# Patient Record
Sex: Male | Born: 1940 | Race: White | Hispanic: No | Smoking: Former smoker
Health system: Southern US, Community
[De-identification: ages and names within clinical notes are randomized; demographics above are authoritative.]

## PROBLEM LIST (undated history)

## (undated) DIAGNOSIS — F32A Depression, unspecified: Secondary | ICD-10-CM

## (undated) DIAGNOSIS — N400 Enlarged prostate without lower urinary tract symptoms: Secondary | ICD-10-CM

## (undated) DIAGNOSIS — G4730 Sleep apnea, unspecified: Secondary | ICD-10-CM

## (undated) DIAGNOSIS — N184 Chronic kidney disease, stage 4 (severe): Secondary | ICD-10-CM

## (undated) DIAGNOSIS — K219 Gastro-esophageal reflux disease without esophagitis: Secondary | ICD-10-CM

## (undated) DIAGNOSIS — H7100 Cholesteatoma of attic, unspecified ear: Secondary | ICD-10-CM

## (undated) DIAGNOSIS — E119 Type 2 diabetes mellitus without complications: Secondary | ICD-10-CM

## (undated) DIAGNOSIS — I5022 Chronic systolic (congestive) heart failure: Secondary | ICD-10-CM

## (undated) DIAGNOSIS — I739 Peripheral vascular disease, unspecified: Secondary | ICD-10-CM

## (undated) DIAGNOSIS — I509 Heart failure, unspecified: Secondary | ICD-10-CM

## (undated) DIAGNOSIS — F329 Major depressive disorder, single episode, unspecified: Secondary | ICD-10-CM

## (undated) DIAGNOSIS — I480 Paroxysmal atrial fibrillation: Secondary | ICD-10-CM

## (undated) DIAGNOSIS — I639 Cerebral infarction, unspecified: Secondary | ICD-10-CM

## (undated) DIAGNOSIS — H9390 Unspecified disorder of ear, unspecified ear: Secondary | ICD-10-CM

## (undated) DIAGNOSIS — I10 Essential (primary) hypertension: Secondary | ICD-10-CM

## (undated) DIAGNOSIS — F419 Anxiety disorder, unspecified: Secondary | ICD-10-CM

## (undated) DIAGNOSIS — J61 Pneumoconiosis due to asbestos and other mineral fibers: Secondary | ICD-10-CM

## (undated) DIAGNOSIS — I2510 Atherosclerotic heart disease of native coronary artery without angina pectoris: Secondary | ICD-10-CM

## (undated) DIAGNOSIS — E785 Hyperlipidemia, unspecified: Secondary | ICD-10-CM

## (undated) DIAGNOSIS — D649 Anemia, unspecified: Secondary | ICD-10-CM

## (undated) DIAGNOSIS — I493 Ventricular premature depolarization: Secondary | ICD-10-CM

## (undated) HISTORY — DX: Benign prostatic hyperplasia without lower urinary tract symptoms: N40.0

## (undated) HISTORY — PX: COCHLEAR IMPLANT: SUR684

## (undated) HISTORY — DX: Unspecified disorder of ear, unspecified ear: H93.90

## (undated) HISTORY — PX: TONSILLECTOMY: SUR1361

## (undated) HISTORY — DX: Type 2 diabetes mellitus without complications: E11.9

## (undated) HISTORY — DX: Pneumoconiosis due to asbestos and other mineral fibers: J61

## (undated) HISTORY — PX: CAROTID STENT INSERTION: SHX5766

## (undated) HISTORY — DX: Peripheral vascular disease, unspecified: I73.9

## (undated) HISTORY — PX: CORONARY STENT PLACEMENT: SHX1402

## (undated) HISTORY — DX: Cholesteatoma of attic, unspecified ear: H71.00

## (undated) HISTORY — DX: Essential (primary) hypertension: I10

## (undated) HISTORY — DX: Hyperlipidemia, unspecified: E78.5

## (undated) HISTORY — DX: Atherosclerotic heart disease of native coronary artery without angina pectoris: I25.10

## (undated) HISTORY — DX: Cerebral infarction, unspecified: I63.9

---

## 2012-01-30 ENCOUNTER — Other Ambulatory Visit: Payer: Self-pay | Admitting: Otolaryngology

## 2012-01-30 DIAGNOSIS — H7103 Cholesteatoma of attic, bilateral: Secondary | ICD-10-CM

## 2012-02-01 ENCOUNTER — Ambulatory Visit
Admission: RE | Admit: 2012-02-01 | Discharge: 2012-02-01 | Disposition: A | Discharge status: Home / Self Care | Payer: Medicare Other | Source: Ambulatory Visit | Attending: Otolaryngology | Admitting: Otolaryngology

## 2012-02-01 DIAGNOSIS — H7103 Cholesteatoma of attic, bilateral: Secondary | ICD-10-CM

## 2012-03-24 LAB — PROTIME-INR

## 2012-05-12 LAB — PROTIME-INR

## 2012-06-26 ENCOUNTER — Ambulatory Visit (INDEPENDENT_AMBULATORY_CARE_PROVIDER_SITE_OTHER): Payer: Medicare Other | Admitting: Cardiology

## 2012-06-26 ENCOUNTER — Encounter: Payer: Self-pay | Admitting: Cardiology

## 2012-06-26 VITALS — BP 169/77 | HR 67 | Ht 68.0 in | Wt 194.0 lb

## 2012-06-26 DIAGNOSIS — E785 Hyperlipidemia, unspecified: Secondary | ICD-10-CM

## 2012-06-26 DIAGNOSIS — Z01810 Encounter for preprocedural cardiovascular examination: Secondary | ICD-10-CM | POA: Insufficient documentation

## 2012-06-26 DIAGNOSIS — I2510 Atherosclerotic heart disease of native coronary artery without angina pectoris: Secondary | ICD-10-CM

## 2012-06-26 DIAGNOSIS — I2589 Other forms of chronic ischemic heart disease: Secondary | ICD-10-CM

## 2012-06-26 DIAGNOSIS — E782 Mixed hyperlipidemia: Secondary | ICD-10-CM | POA: Insufficient documentation

## 2012-06-26 DIAGNOSIS — I739 Peripheral vascular disease, unspecified: Secondary | ICD-10-CM

## 2012-06-26 DIAGNOSIS — I25810 Atherosclerosis of coronary artery bypass graft(s) without angina pectoris: Secondary | ICD-10-CM | POA: Insufficient documentation

## 2012-06-26 DIAGNOSIS — I10 Essential (primary) hypertension: Secondary | ICD-10-CM

## 2012-06-26 DIAGNOSIS — I255 Ischemic cardiomyopathy: Secondary | ICD-10-CM

## 2012-06-26 MED ORDER — CARVEDILOL 12.5 MG PO TABS: 12.5000 mg | ORAL_TABLET | Freq: Two times a day (BID) | ORAL | Status: DC

## 2012-06-26 NOTE — Assessment & Plan Note (Addendum)
Continue ARB and beta blocker; increase Coreg to 12.5 mg by mouth twice a day. We'll most likely repeat echocardiogram in 3 months when he returns to see if his LV function has improved following PCI of his LAD.

## 2012-06-26 NOTE — Assessment & Plan Note (Signed)
Continue aspirin, Plavix and statin. 

## 2012-06-26 NOTE — Assessment & Plan Note (Signed)
Continue aspirin, Plavix and statin. We will reassess with ABIs in the future.

## 2012-06-26 NOTE — Patient Instructions (Addendum)
Your physician recommends that you schedule a follow-up appointment in: 3 MONTHS WITH DR CRENSHAW  INCREASE CARVEDILOL TO 12.5 MG TWICE DAILY

## 2012-06-26 NOTE — Assessment & Plan Note (Signed)
Blood pressure is elevated. Increase carvedilol to 12.5 mg by mouth twice a day both for blood pressure and his ischemic cardiomyopathy.

## 2012-06-26 NOTE — Assessment & Plan Note (Signed)
Patient presents for preoperative evaluation prior to ear surgery which will require general anesthesia. He has no recurrent symptoms of dyspnea or chest pain. I therefore feel he could proceed with surgery without further ischemia evaluation. However he had drug-eluting stents placed to his LAD in June of 2013. He therefore should continue dual antiplatelet therapy for one year uninterrupted. Discontinuing his Plavix would increase the risk of stent thrombosis. We will need to discuss this with ENT. If he can have his surgery on both aspirin and Plavix then he may proceed. Otherwise I would prefer him to complete a full year of Plavix prior to interrupting and proceeding with ear surgery.

## 2012-06-26 NOTE — Progress Notes (Signed)
HPI: 71 year old male with past medical history of coronary artery disease for preoperative evaluation prior to ear surgery which will require general anesthesia. Patient states he suffered a myocardial infarction at age 1 in [place]. He had a cardiac catheterization but medical therapy was recommended. Patient was recently seen in [facility] preoperatively. An echocardiogram was performed and by report ejection fraction was 30-35%, mild LVH, mild left atrial enlargement, mild mitral regurgitation and trace aortic insufficiency. The patient subsequently was seen in [place] by his nephew who is a cardiologist. He apparently was having some chest pain as well and underwent cardiac catheterization in June of 2013. His ejection fraction was 40% and there was hypokinesis of the anterior wall. There was a 30-40% mid right coronary artery and a 30-40% mid PDA. The left main was normal. There was an 80% proximal LAD and a 70-80% mid lesion. The circumflex had a 90% lesion after the origin of a first marginal but continued mainly as a moderate size atrial branch. The patient had PCI of his LAD with 2 Ion drug-eluting stents. Medical therapy recommended for the circumflex. In July of 2013 the patient had an aortogram because of claudication and abnormal ABIs. There was no abdominal aortic aneurysm and the renal arteries were normal. There is a 50% distal common iliac on the left. The SFA had multiple lesions from 50-70%. 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 There was a 70-80% proximal right common iliac artery. 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 The SFA had a 70-80% lesion. The anterior tibial had a 50-60% lesion. The patient had a covered stent to the common iliac on the right. If the patient had persistent symptoms there was plan for her intervention to the SFA on the right. Medical therapy recommended for the left. The patient has some dyspnea on exertion but no  orthopnea, PND, pedal edema, palpitations, syncope or exertional chest pain. He has pain in his thighs with ambulation bilaterally.  Current Outpatient Prescriptions  Medication Sig Dispense Refill  . allopurinol (ZYLOPRIM) 300 MG tablet Take 1 tablet by mouth Daily.      Marland Kitchen aspirin 81 MG tablet Take 81 mg by mouth daily.      . carvedilol (COREG) 12.5 MG tablet Take 1 tablet (12.5 mg total) by mouth 2 (two) times daily with a meal.  180 tablet  4  . cephALEXin (KEFLEX) 500 MG capsule 3 tabs po qd      . clopidogrel (PLAVIX) 75 MG tablet Take 1 tablet by mouth Daily.      . CRESTOR 20 MG tablet Take 1 tablet by mouth Daily.      Marland Kitchen DIOVAN 320 MG tablet Take 1 tablet by mouth Daily.      Marland Kitchen glipiZIDE (GLUCOTROL XL) 2.5 MG 24 hr tablet Take 2.5 mg by mouth 2 (two) times daily.      Marland Kitchen glipiZIDE (GLUCOTROL XL) 5 MG 24 hr tablet Take 5 mg by mouth 2 (two) times daily.      . hydrochlorothiazide (MICROZIDE) 12.5 MG capsule Take 1 tablet by mouth Daily.      . metFORMIN (GLUCOPHAGE-XR) 500 MG 24 hr tablet Take 1,000 mg by mouth BID times 48H.       . pantoprazole (PROTONIX) 40 MG tablet Take 40 mg by mouth daily.      Marland Kitchen SPIRIVA HANDIHALER 18 MCG inhalation capsule PRN      . Tamsulosin HCl (FLOMAX) 0.4 MG CAPS Take 1 tablet by mouth Daily.      Marland Kitchen  DISCONTD: carvedilol (COREG) 6.25 MG tablet Take 1 tablet by mouth BID times 48H.        Allergies  Allergen Reactions  . Penicillins     Past Medical History  Diagnosis Date  . Cholesteatoma of attic   . CAD (coronary artery disease)   . Ear disease   . Stroke   . Diabetes mellitus   . PVD (peripheral vascular disease)   . Hypertension   . Hyperlipidemia   . Asbestosis   . BPH (benign prostatic hyperplasia)     Past Surgical History  Procedure Date  . Coronary stent placement   . Cochlear implant   . Tonsillectomy     History   Social History  . Marital Status: Widowed    Spouse Name: N/A    Number of Children: 3  . Years of  Education: N/A   Occupational History  . Not on file.   Social History Main Topics  . Smoking status: Former Research scientist (life sciences)  . Smokeless tobacco: Not on file  . Alcohol Use: Yes     2-3 glasses wine per day  . Drug Use: Not on file  . Sexually Active: Not on file   Other Topics Concern  . Not on file   Social History Narrative  . No narrative on file    Family History  Problem Relation Age of Onset  . Diabetes      ROS: diminished hearing but no fevers or chills, productive cough, hemoptysis, dysphasia, odynophagia, melena, hematochezia, dysuria, hematuria, rash, seizure activity, orthopnea, PND, pedal edema. Remaining systems are negative.  Physical Exam:  Blood pressure 169/77, pulse 67, height 5\' 8"  (1.727 m), weight 194 lb (87.998 kg).  General:  Well developed/well nourished in NAD Skin warm/dry Patient not depressed No peripheral clubbing Back-normal HEENT-normal/normal eyelids Neck supple/normal carotid upstroke bilaterally; no bruits; no JVD; no thyromegaly chest - CTA/ normal expansion CV - RRR/normal S1 and S2; no murmurs, rubs or gallops;  PMI nondisplaced Abdomen -NT/ND, no HSM, no mass, + bowel sounds, no bruit 2+ femoral pulses, no bruits Ext-no edema, chords, 2+ DP Neuro-grossly nonfocal  ECG NSR, no significant ST changes.

## 2012-06-26 NOTE — Assessment & Plan Note (Signed)
Continue statin. Lipids and liver monitored by primary care. 

## 2012-10-03 ENCOUNTER — Encounter: Payer: Self-pay | Admitting: Cardiology

## 2012-10-03 ENCOUNTER — Ambulatory Visit (INDEPENDENT_AMBULATORY_CARE_PROVIDER_SITE_OTHER): Payer: Medicare Other | Admitting: Cardiology

## 2012-10-03 VITALS — BP 164/82 | HR 70 | Ht 68.0 in | Wt 193.0 lb

## 2012-10-03 DIAGNOSIS — I10 Essential (primary) hypertension: Secondary | ICD-10-CM

## 2012-10-03 DIAGNOSIS — I428 Other cardiomyopathies: Secondary | ICD-10-CM

## 2012-10-03 DIAGNOSIS — I429 Cardiomyopathy, unspecified: Secondary | ICD-10-CM

## 2012-10-03 DIAGNOSIS — I739 Peripheral vascular disease, unspecified: Secondary | ICD-10-CM

## 2012-10-03 DIAGNOSIS — E785 Hyperlipidemia, unspecified: Secondary | ICD-10-CM

## 2012-10-03 DIAGNOSIS — Z01810 Encounter for preprocedural cardiovascular examination: Secondary | ICD-10-CM

## 2012-10-03 MED ORDER — AMLODIPINE BESYLATE 5 MG PO TABS: 5.0000 mg | ORAL_TABLET | Freq: Every day | ORAL | Status: DC

## 2012-10-03 NOTE — Patient Instructions (Addendum)
Your physician wants you to follow-up in: [place] will receive a reminder letter in the mail two months in advance. If you don't receive a letter, please call our office to schedule the follow-up appointment.   START AMLODIPINE 5 MG ONCE DAILY  Your physician has requested that you have an echocardiogram. Echocardiography is a painless test that uses sound waves to create images of your heart. It provides your doctor with information about the size and shape of your heart and how well your heart's chambers and valves are working. This procedure takes approximately one hour. There are no restrictions for this procedure.SCHEDULE IN 3 MONTHS

## 2012-10-03 NOTE — Assessment & Plan Note (Signed)
Continue aspirin and Plavix for now. I would like to delay his ear surgery until 1 year following his previous drug-eluting stent to the LAD; Plavix could be discontinued at that time. If he requires surgery sooner then we could discontinue Plavix transiently realizing there is a small risk of stent thrombosis but continue his aspirin.

## 2012-10-03 NOTE — Assessment & Plan Note (Signed)
Continue aspirin, Plavix and statin. 

## 2012-10-03 NOTE — Assessment & Plan Note (Signed)
Continue present medications. Add Norvasc 5 mg daily.

## 2012-10-03 NOTE — Assessment & Plan Note (Signed)
Continue aspirin and statin. 

## 2012-10-03 NOTE — Assessment & Plan Note (Signed)
Continue present medications. Plan repeat echocardiogram in 3 months.

## 2012-10-03 NOTE — Progress Notes (Signed)
HPI: Pleasant male for fu of CAD. Patient states he suffered a myocardial infarction at age 71 in [place]. He had a cardiac catheterization but medical therapy was recommended. Patient was recently seen in [facility] preoperatively. An echocardiogram was performed and by report ejection fraction was 30-35%, mild LVH, mild left atrial enlargement, mild mitral regurgitation and trace aortic insufficiency. The patient subsequently was seen in [place] by his nephew who is a cardiologist. He apparently was having some chest pain as well and underwent cardiac catheterization in June of 2013. His ejection fraction was 40% and there was hypokinesis of the anterior wall. There was a 30-40% mid right coronary artery and a 30-40% mid PDA. The left main was normal. There was an 80% proximal LAD and a 70-80% mid lesion. The circumflex had a 90% lesion after the origin of a first marginal but continued mainly as a moderate size atrial branch. The patient had PCI of his LAD with 2 Ion drug-eluting stents. Medical therapy recommended for the circumflex. In July of 2013 the patient had an aortogram because of claudication and abnormal ABIs. There was no abdominal aortic aneurysm and the renal arteries were normal. There is a 50% distal common iliac on the left. The SFA had multiple lesions from 50-70%. 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 There was a 70-80% proximal right common iliac artery. 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 The SFA had a 70-80% lesion. The anterior tibial had a 50-60% lesion. The patient had a covered stent to the common iliac on the right. If the patient had persistent symptoms there was plan for her intervention to the SFA on the right. Medical therapy recommended for the left. When I saw him previously he wanted to pursue ear surgery. I was concerned about discontinuing his antiplatelet therapy that close to previous drug-eluting stent. Since I last saw  him, the patient denies any dyspnea on exertion, orthopnea, PND, pedal edema, palpitations, syncope or chest pain. He has some pain in his hips bilaterally with ambulation after 1 block.    Current Outpatient Prescriptions  Medication Sig Dispense Refill  . allopurinol (ZYLOPRIM) 300 MG tablet Take 1 tablet by mouth Daily.      Marland Kitchen aspirin 81 MG tablet Take 81 mg by mouth daily.      . carvedilol (COREG) 12.5 MG tablet Take 1 tablet (12.5 mg total) by mouth 2 (two) times daily with a meal.  180 tablet  4  . clopidogrel (PLAVIX) 75 MG tablet Take 1 tablet by mouth Daily.      . CRESTOR 20 MG tablet Take 1 tablet by mouth Daily.      Marland Kitchen DIOVAN 320 MG tablet Take 1 tablet by mouth Daily.      Marland Kitchen glipiZIDE (GLUCOTROL XL) 2.5 MG 24 hr tablet Take 2.5 mg by mouth 2 (two) times daily.      Marland Kitchen glipiZIDE (GLUCOTROL XL) 5 MG 24 hr tablet Take 5 mg by mouth 2 (two) times daily.      . hydrochlorothiazide (MICROZIDE) 12.5 MG capsule Take 1 tablet by mouth Daily.      . metFORMIN (GLUCOPHAGE-XR) 500 MG 24 hr tablet Take 1,000 mg by mouth BID times 48H.       . pantoprazole (PROTONIX) 40 MG tablet Take 40 mg by mouth daily.      Marland Kitchen SPIRIVA HANDIHALER 18 MCG inhalation capsule PRN      . Tamsulosin HCl (FLOMAX) 0.4 MG CAPS Take 1 tablet  by mouth Daily.         Past Medical History  Diagnosis Date  . Cholesteatoma of attic   . CAD (coronary artery disease)   . Ear disease   . Stroke   . Diabetes mellitus   . PVD (peripheral vascular disease)   . Hypertension   . Hyperlipidemia   . Asbestosis   . BPH (benign prostatic hyperplasia)     Past Surgical History  Procedure Date  . Coronary stent placement   . Cochlear implant   . Tonsillectomy     History   Social History  . Marital Status: Widowed    Spouse Name: N/A    Number of Children: 3  . Years of Education: N/A   Occupational History  . Not on file.   Social History Main Topics  . Smoking status: Former Research scientist (life sciences)  . Smokeless tobacco:  Not on file  . Alcohol Use: Yes     Comment: 2-3 glasses wine per day  . Drug Use: Not on file  . Sexually Active: Not on file   Other Topics Concern  . Not on file   Social History Narrative  . No narrative on file    ROS: no fevers or chills, productive cough, hemoptysis, dysphasia, odynophagia, melena, hematochezia, dysuria, hematuria, rash, seizure activity, orthopnea, PND, pedal edema. Remaining systems are negative.  Physical Exam: Well-developed well-nourished in no acute distress.  Skin is warm and dry.  HEENT is normal.  Neck is supple.  Chest is clear to auscultation with normal expansion.  Cardiovascular exam is regular rate and rhythm.  Abdominal exam nontender or distended. No masses palpated. Extremities show no edema. neuro grossly intact

## 2012-10-03 NOTE — Assessment & Plan Note (Signed)
Continue statin. 

## 2013-01-01 ENCOUNTER — Ambulatory Visit (HOSPITAL_COMMUNITY): Payer: Medicare Other | Attending: Cardiology

## 2013-01-01 DIAGNOSIS — I059 Rheumatic mitral valve disease, unspecified: Secondary | ICD-10-CM | POA: Insufficient documentation

## 2013-01-01 DIAGNOSIS — I10 Essential (primary) hypertension: Secondary | ICD-10-CM | POA: Insufficient documentation

## 2013-01-01 DIAGNOSIS — I2589 Other forms of chronic ischemic heart disease: Secondary | ICD-10-CM

## 2013-01-01 DIAGNOSIS — I429 Cardiomyopathy, unspecified: Secondary | ICD-10-CM

## 2013-01-01 DIAGNOSIS — E785 Hyperlipidemia, unspecified: Secondary | ICD-10-CM | POA: Insufficient documentation

## 2013-01-01 NOTE — Progress Notes (Signed)
Echocardiogram performed.  

## 2013-01-05 ENCOUNTER — Telehealth: Payer: Self-pay | Admitting: Cardiology

## 2013-01-05 NOTE — Telephone Encounter (Signed)
Pt rtn call re results of echo

## 2013-01-05 NOTE — Telephone Encounter (Signed)
Spoke with pt, questions regarding EF% answered.

## 2013-02-10 ENCOUNTER — Telehealth: Payer: Self-pay | Admitting: Cardiology

## 2013-02-10 NOTE — Telephone Encounter (Signed)
New Prob   Calling about John [name] having surgery. Didn't disclose any other information. Would like to speak to nurse.

## 2013-02-10 NOTE — Telephone Encounter (Signed)
Spoke with John [name], aware according to the last office note we would prefer the ear surgery be postponed until after 06-2013. She voiced understanding.

## 2013-04-07 ENCOUNTER — Telehealth: Payer: Self-pay | Admitting: Cardiology

## 2013-04-07 NOTE — Telephone Encounter (Signed)
New Problem  Pt states he is going to have to have dental surgery and wants to know if it will be ok for him to come off of the PLAVIX 75MG 

## 2013-04-07 NOTE — Telephone Encounter (Signed)
Spoke with pt, he has broken a tooth and thinks it may need to be pulled. His last stenting was July 2013. Pt made aware he may have to wait until after July but will check with dr [name] to make sure

## 2013-04-08 NOTE — Telephone Encounter (Signed)
Spoke with pt, Aware of dr crenshaw's recommendations.  °

## 2013-04-08 NOTE — Telephone Encounter (Signed)
Left message for pt to call.

## 2013-04-08 NOTE — Telephone Encounter (Signed)
Ok to proceed in July; DC plavix at that time but continue ASA Kirk Ruths

## 2013-04-16 ENCOUNTER — Telehealth: Payer: Self-pay | Admitting: Cardiology

## 2013-04-16 NOTE — Telephone Encounter (Signed)
New Prob     Pt has a question regarding his PLAVIS. Please call.

## 2013-04-16 NOTE — Telephone Encounter (Signed)
Spoke with pt, questions regarding plavix and dental work answered.

## 2013-04-28 ENCOUNTER — Telehealth: Payer: Self-pay | Admitting: Cardiology

## 2013-04-28 NOTE — Telephone Encounter (Signed)
New Prob     Pt would like to speak to nurse regarding a clearance to go off of his PRAVIS. Please call.

## 2013-04-28 NOTE — Telephone Encounter (Signed)
Ok for surgery and to hold  plavix prior to procedure. Kirk Ruths

## 2013-04-28 NOTE — Telephone Encounter (Signed)
Spoke with pt, he is needing clearance for bone anchored hearing aid to be done by dr Thornell Mule in [place]. Also needs clearance to hold his plavix for the procedure. Will forward for dr [name] review

## 2013-04-29 NOTE — Telephone Encounter (Signed)
Left message for pt to call.

## 2013-04-29 NOTE — Telephone Encounter (Signed)
Spoke with pt, Aware of dr Jacalyn Lefevre recommendations. Will forward to dr Thornell Mule

## 2013-05-18 NOTE — Telephone Encounter (Signed)
New Prob     Pt states he is needing clearance for surgery. Pt states the note that was sent to Dr. Thornell Mule only gave OK to hold PLAVIX.

## 2013-05-18 NOTE — Telephone Encounter (Signed)
Spoke with pt, aware message sent contained clearance. Will resend note.

## 2013-06-08 ENCOUNTER — Telehealth: Payer: Self-pay | Admitting: Cardiology

## 2013-06-08 NOTE — Telephone Encounter (Signed)
Pt states dr Mayer Masker office didn't receive surgical clearance we sent 05-18-13

## 2013-06-08 NOTE — Telephone Encounter (Signed)
Called and confirmed dr Thornell Mule fax number. Telephone note containing clearance resent.

## 2013-06-19 ENCOUNTER — Encounter (HOSPITAL_BASED_OUTPATIENT_CLINIC_OR_DEPARTMENT_OTHER): Payer: Self-pay | Admitting: *Deleted

## 2013-06-23 NOTE — H&P (Signed)
John [name] is an 72 y.o. male.   Chief Complaint: 1. Moderate Bilateral Mixed Hearing Losses AU. 2. L Temporal Mass HPI: See H&P below  History & Physical Examination   Patient: John [name]  Provider: Vicie Mutters, MD, MS, FACS  Date of Service:  Jun 23, 2013  Location: [facility] [place], [place]                  [place], [place]   [id number]                                Ph: [phone], Fax: [phone]                  [email or web address]     Provider: Vicie Mutters, MD, MS, FACS Encounter Date: Jun 23, 2013  Patient: John [name], John [name]   [id number]) Sex: Male       DOB: [birth year]      Age: 60 year 4 month       Race: White Address: [address]  [place]  [place]  [address] Insurance: MEDICARE  Referred By:  Vicie Mutters   Visit Type: [place] John [name], a 58 year 64 month White male is here today for a pre-operative visit.  Complaint/HPI: The patient was here today with his cousin for a preoperative evaluation prior to undergoing a left Ponto hearing implant. He has been taken off his Plavix. He also has not taking aspirin for more than one week. Also pointed out a mass in his left side burn area that has been enlarging. He denied upper respiratory tract infection, cough, or fever. He has been cleared for the general anesthesia by his physician.  Previous history: The patient was here today with his cousin, for follow-up of both ears. 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 He has significant heart disease. He is cared for by Dr. [name] of [facility].   Previous history: The patient was here today with his cousin for follow-up of right middle ear cholesteatoma. The patient does not have any significant complaints. He is scheduled to have a cardiology follow-up relatively soon. He denies otorrhea, otalgia, tinnitus  or vertigo. He remains on anticoagulants and is not a surgical candidate at this time.  Previous history: The patient was here today complaining of bleeding from his right ear. The patient is on Plavix. He recently saw his cardiologist, Dr. [name], who thinks that he may repeat his echo and may take him off Plavix in three months. The patient is known to have a cholesteatoma, right ear, and I have not been able to operate upon his right ear because of his heart disease and in a coagulation. He denied otorrhea, tinnitus, or vertigo.  Previous history: The patient was here today with his cousin, for follow-up of right attic cholesteatoma. The patient feels that he has not been hearing well, particularly from the right ear. He denies otorrhea, otalgia, or vertigo. He has significant heart disease and is currently anticoagulated. He is not a candidate for operative intervention at this time.  Previous history: The patient returns today with his cousin for follow-up of a right attic cholesteatoma, left modified radical mastoidectomy cavity, and history of significant  heart and vascular disease. The patient was recently evaluated at [facility] In [place], [place]. He did not like what the cardiologist told him and then visited a nephew in [place] who is a cardiologist. Patient underwent a coronary artery stent placement and stenting of in iliac artery. He had been having leg pain. The patient did not complain of any shortness of breath He is interested in a Ponto implant and is wearing binaural digital BTE hearing aids.  Previous Hx: The patient is here today in follow-up after being treated for an infected right atticotomy with cholesteatoma. Culture grew diphtheroids. He is asymptomatic other than for hearing loss. He denied otorrhea, otalgia, tinnitus, vertigo, headache, neurologic signs or symptoms or change in level of consciousness. The patient has undergone a right atticotomy in the  past as well as a left modified radical mastoidectomy. He is wearing binaural digital BTE hearing aids.  Previous history: The patient is here today with his cousin. He complains of chronic itching and drainage from his right ear. The patient has had multiple ear procedures performed in [place]. He is wearing binaural hearing aids. 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 He denied any focal neurologic signs or symptoms.   Current Medication: 1. Ciprodex 0.3-0.1 % Drops Susp  SIG: 3 drops in right ear 3 times/day x 1 wk 2. Allopurinol 300 Mg Tablet (Other MD)  3. Aspirin 81 Mg Tablet Chew (Other MD)  4. Cefdinir 300 Mg Capsule (Other MD)  5. Chromium Pico 500 Mcg Tablet (Other MD)  6. Crestor 20 Mg Tablet (Other MD)  7. Diovan 320 Mg Tablet (Other MD)  8. Fish Oil 1,200 Mg Softgel 360-1,200 (Other MD)  9. Glipizide Xl 10 Mg Tablet (Other MD)  10. Glipizide Xl 2.5 Mg Tablet (Other MD)  11. Hydrochlorothiazide 12.5 Mg Tb (Other MD)  12. Lysine 500 Mg Tablet (Other MD)  13. Metformin Hcl Er 500 Mg Tab (Other MD)  14. Pantoprazole Sod Dr 40 Mg Tab (Other MD)  15. Spiriva 18 Mcg Cp-handihaler (Other MD)  16. Tamsulosin Hcl 0.4 Mg Capsule (Other MD)  17. Trunature Chewable Probiotic 1.5 Billion Cell (Other MD)  18. Vitamin B-12 1,000 Mcg Tab Sl (Other MD)  19. Vitamin C 500 Mg Tablet (Other MD)  20. Vitamin D 1,000 Unit Tablet (Other MD)  21. Crestor 10 Mg Tablet (Other MD)  22. Diovan 40 Mg Tablet (Other MD)  23. Glipizide Er 2.5 Mg Tablet (Other MD)  24. Metformin Hcl 500 Mg Tablet (Other MD)  25. Pantoprazole Sod Dr 20 Mg Tab (Other MD)  26. Carvedilol 6.25 Mg Tablet (Other MD)  27. Clopidogrel 75 Mg Tablet (Other MD)  28. Plavix 75 Mg Tablet (Other MD)   Medical History: Ear Operations: Marland Kitchen Mastoidectomy: Right atticotomy.  Surgical History: Prior surgeries include Masoidectomy - modified radical and Mastoidectomy.  Anesthesia  History: Anesthesia History (-) Problems with anesthesia.  Cancer: (+) Cancer: Skin Cancer..  Family History: The patient has a family history of Diabetes mellitus.  Social History: Adult. Smoking: His current smoking status is never smoker/non-smoker. Alcohol: Patient drinks alcoholic beverages. Marital Status: Patient is married. HIV status: (-) HIV status. Recreational Drug Use: He denies recreational drug use.  ROS: General: (-) fever, (-) chills, (-) night sweats, (-) fatigue, (-) weakness, (-) changes in appetite or weight. (-) allergies, (-) not immunocompromised. Head: (-) headaches, (-) head injury or deformity. Eyes: (-) visual changes, (-) eye pain, (-)  eye discharges, (-) redness, (-) itching, (-) excessive tearing, (-) double or blurred vision, (-) glaucoma, (-) cataracts. Ears: (-) hearing changes, (-) tinnitus, (-) vertigo, (-) dizziness, (-) earache, (-) ear infection, (-) ear discharge, (-) use of hearing aids. Nose and Sinuses: (-) frequent colds, (-) nasal stuffiness or itchiness, (-) postnasal drip, (-) hay fever, (-) nosebleeds, (-) sinus trouble. Mouth and Throat: (-) bleeding gums, (-) toothache, (-) odd taste sensations, (-) sores on tongue, (-) frequent sore throat, (-) hoarseness. Neck: (-) swollen glands, (-) enlarged thyroid, (-) neck pain. Cardiac: (+) circulation problems , (+) high blood pressure. Respiratory: (-) cough, (-) hemoptysis, (-) shortness of breath, (-) cyanosis, (-) wheezing, (-) nocturnal choking or gasping, (-) TB exposure. Gastrointestinal: (+) reflux. Urinary: (-) dysuria, (-) frequency, (-) urgency, (-) hesitancy, (-) polyuria, (-) nocturia, (-) hematuria, (-) urinary incontinence, (-) flank pain, (-) change in urinary habits. Gynecologic/Urologic: (-) genital sores or lesions, (-) history of STD, (-) sexual difficulties. Musculoskeletal: (-) muscle pain, (-) joint pain, (-) bone pain. Peripheral Vascular: (-) intermittent claudication,  (-) cramps, (-) varicose veins, (-) thrombophlebitis. Neurological: Stroke. Psychiatric: (-) anxiety, (-) depression, (-) sleep disturbance, (-) irritability, (-) mood swings, (-) suicidal thoughts or ideations. Endocrine: diabetes mellitus. Hematologic/Lymphatic: (-) anemia, (-) easy bruising, (-) excessive bleeding, (-) history of blood transfusions. Skin: skin cancer.  Vital Signs: Weight:   85.729 kgs Height:   5\' 8"  BMI:   28.73 BSA:   2.03 BP:   131/74  Examination: General Appearance - Adult: The patient is a well-developed, well-nourished, male, has no recognizable syndromes or patterns of malformation, and is in no acute distress. He is awake, alert, coherent, spontaneous, and logical. He is oriented to time, place, and person and communicates without difficulty.  Head: The patient a 4 x 3 cm doughy mass in the L sideburn/temporal area that has a consistency of a lipoma. I cannot rule out an epidermal inclusion cyst. The mass has been growing. Patient inquired as to whether it should be removed.  Face: His facial motion was intact and symmetric bilaterally with normal resting facial tone and voluntary facial power.  Skin: Gross inspection of his facial skin demonstrated no evidence of abnormality.  Eyes: His pupils are equal, regular, reactive to light and accomodate (PERRLA). Extraocular movements were intact (EOMI). Conjunctivae were normal. There was no sclera icterus. There was no nystagmus. Eyelids appeared normal. There was no ptosis, lidlag, lid edema, or lagophthalmus.  External ears: Both of his external ears were normal in size, shape, angulation, and location.  External auditory canals: Examination of the external auditory canal revealed The right external auditory canal was debrided of a small amount of debris.  Procedure: Using the microscope and suction, the right ear canal was cleaned. His tympanic membrane is collapsed posteriorly. The ear was meticulously  cleaned and debrided. I remove some superficial cholesteatoma from his stapes superstructure in the posterior superior quadrant. He tolerated the procedure well. The patient had less inflammatory response today than I have seen in the past. The cavity was sprayed with boric acid and Vioform powders.  Right Tympanic Membrane: The patient continues to have a small amount of cholesteatoma. It is in an atticotomy retraction pocket as well as in the area of the stapes superstructure. However, the cholesteatoma has lessened with frequent cleaning.  Procedure: Using the microscope and suction, I was unable to remove a portion of the cholesteatoma in the atticotomy retraction and around the stapes superstructure. He tolerated the procedure  well.  Left Tympanic Membrane: The patient has a modified radical mastoidectomy cavity AS.  Procedure: Using the or microscope and suction, the left mastoidectomy cavity was cleaned. There was debris over the sigmoid plate and some anterior inferiorly lateral to the left hypotympanum. He tolerated the procedure well. The cavity was sprayed with boric acid and Vioform powders.  Nose - external exam: External examination of the nose revealed a stable nasal dorsum with normal support, normal skin, and patent nares. There were no deformities. Nose - internal exam: Patient's nasal septum is deviated to the right.  Oral Cavity: Examination of the oral cavity revealed healthy moist mucosa, no evidence of lesions, ulcerations, erythema, edema, or leukoplakia. Gingiva and teeth were unremarkable. His lips, tongue and palates were normal. There were no lingual fasciculations. The oropharynx was symmetric and without lesions. The gag reflex was intact and symmetric.  Neck: Examination of his neck revealed full range of motion without pain. There were no significant palpable masses or cervical lymphadenopathy. There was normal laryngeal crepitus. The trachea was midline. His thyroid  gland was not enlarged and did not have any palpable masses. There was no evidence of jugular venous distention. There were no audible carotid bruits.  Impression: Other:  1.Active attic and middle ear cholesteatoma, right ear, in what looks like a previous atticotomy. Patient is not a candidate for a right tympanomastoidectomy at this time. However, the cholesteatoma has calmed with frequent cleanings.  2. Stable left modified radical mastoidectomy cavity with superficial epidermitis.  3. Bilateral moderately severe mixed hearing losses with SRTs of 45 DB AU and normal discrimination.  4. Because of heart disease, the patient is not a candidate for a revision right tympanomastoidectomy, possible modified radical mastoidectomy to treat the recurrent cholesteatoma. However, the cholesteatoma seems to be under control at this time. At the same time, he would benefit from a Ponto implant AS. He is currently off anticoagulants and has been cleared for anesthesia.  5. The patient has had a stroke in the past and is a type II diabetic.   6. L temporal mass, 4x3 cm. that should be excised at the same time as the Ponto hearing implant to prevent another anesthetic in the future.  7. Risks, complications, and alternatives of a left Ponto hearing implant, and excision of the left temporal mass were explained to the patient and to his cousin. Questions were invited and answered. Informed consent was signed and witnessed. Preop teaching and counseling were provided.  Plan: Clinical summary letter made available to patient today. This letter may not be complete at time of service. Please contact our office within 3 days for a completed summary of today's visit.  Status: stable. Medications: None required. Diet: diabetic ( calories/day). Procedure: Ponto hearing implant and excision of L temporal mass, Left ear. Duration:  2 hours. Surgeon: John Knee MD Office Phone: [phone] Office Fax:  [phone] Cell Phone: [phone]. Anesthesia Required: General. Equipment:  Ponto instruments. Implants:  Ponto implant. Recovery [facility] Center: no. Latex Allergy: no.  Informed consent: Informed consent was provided in a quiet examination room and was witnessed. Risks, complications, and alternatives (such as doing nothing, using a CROS system, or using an alternative hearing implant approved for the treatment of their condition) of osseo-integrated hearing implants were explained to the patient cousin and included, but were not limited to: infection, bleeding, reaction to anesthesia, hypertrophic scar formation, need for additional procedures or revisions, failure and/extrusion of prosthesis(es), ear or scalp  numbness, failure to improve hearing, other unforeseen or unpredictable complications, death, etc. Questions were invited and answered. Preoperative teaching and counseling were provided. Informed consent - status: Informed consent was provided and was signed and witnessed. Follow-Up: Postoperative visit as scheduled.  Diagnosis: 385.31  Cholesteatoma of Attic  389.22  Mixed hearing loss - Bilateral  383.33  Postmastoidectomy Granulations  381.81  Dysfunction of Eustachian Tube  784.2              Swelling, Mass or Lump in Head and Neck   Careplan: (1) Hearing Loss  Followup: Postop visit        Next Appointment: 06/25/2013 at 07:30 am     Past Medical History  Diagnosis Date  . Cholesteatoma of attic   . CAD (coronary artery disease)   . Ear disease   . Stroke   . Diabetes mellitus   . PVD (peripheral vascular disease)   . Hypertension   . Hyperlipidemia   . 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 BPH (benign prostatic hyperplasia)   . Anxiety   . Depression   . GERD (gastroesophageal reflux disease)     Past Surgical History  Procedure Laterality Date  . Coronary stent placement    . Cochlear implant    . Tonsillectomy    . Carotid stent insertion Left     Family  History  Problem Relation Age of Onset  . Diabetes     Social History:  reports that he quit smoking about 24 years ago. He does not have any smokeless tobacco history on file. He reports that  drinks alcohol. He reports that he does not use illicit drugs.  Allergies:  Allergies  Allergen Reactions  . Penicillins     No prescriptions prior to admission    No results found for this or any previous visit (from the past 48 hour(s)). No results found.  Review of Systems  Constitutional: Negative.   HENT: Positive for hearing loss.   Eyes: Negative.   Respiratory: Negative.   Cardiovascular: Negative.   Gastrointestinal: Negative.   Musculoskeletal: Negative.   Skin: Negative.   Neurological: Negative.   Endo/Heme/Allergies: Negative.   Psychiatric/Behavioral: Negative.     Height 5\' 8"  (1.727 m), weight 87.091 kg (192 lb). Physical Exam   Assessment/Plan 1. Moderate mixed hearing losses bilaterally. The patient is a candidate for a left Ponto hearing implant. 2. 4x3 cm. Mass, Left temporal area. The mass may be a lipoma or an epidermal inclusion cyst. The mass is growing in size. 3. Recommend a left Ponto hearing implant, and excision of the left temporal mass, two hours, surgical center, gen anesthesia, outpatient. Risks, complications, and alternatives were explained to the patient and to his cousin. Questions were invited and answered. Informed consent was signed and witnessed. Preop teaching and counseling were provided. 4. The procedure is scheduled for June 25, 2013.  Thornell Mule, Kenleigh Toback M 06/23/2013, 7:06 PM

## 2013-06-25 ENCOUNTER — Ambulatory Visit (HOSPITAL_BASED_OUTPATIENT_CLINIC_OR_DEPARTMENT_OTHER)
Admission: RE | Admit: 2013-06-25 | Discharge: 2013-06-25 | Disposition: A | Discharge status: Home / Self Care | Payer: Medicare Other | Source: Ambulatory Visit | Attending: Otolaryngology | Admitting: Otolaryngology

## 2013-06-25 ENCOUNTER — Encounter (HOSPITAL_BASED_OUTPATIENT_CLINIC_OR_DEPARTMENT_OTHER)
Admission: RE | Disposition: A | Discharge status: Home / Self Care | Payer: Self-pay | Source: Ambulatory Visit | Attending: Otolaryngology

## 2013-06-25 ENCOUNTER — Encounter (HOSPITAL_BASED_OUTPATIENT_CLINIC_OR_DEPARTMENT_OTHER): Payer: Self-pay | Admitting: *Deleted

## 2013-06-25 ENCOUNTER — Encounter (HOSPITAL_BASED_OUTPATIENT_CLINIC_OR_DEPARTMENT_OTHER): Payer: Self-pay | Admitting: Anesthesiology

## 2013-06-25 ENCOUNTER — Ambulatory Visit (HOSPITAL_BASED_OUTPATIENT_CLINIC_OR_DEPARTMENT_OTHER): Payer: Medicare Other | Admitting: Anesthesiology

## 2013-06-25 DIAGNOSIS — E119 Type 2 diabetes mellitus without complications: Secondary | ICD-10-CM | POA: Insufficient documentation

## 2013-06-25 DIAGNOSIS — I739 Peripheral vascular disease, unspecified: Secondary | ICD-10-CM | POA: Insufficient documentation

## 2013-06-25 DIAGNOSIS — Z8673 Personal history of transient ischemic attack (TIA), and cerebral infarction without residual deficits: Secondary | ICD-10-CM | POA: Insufficient documentation

## 2013-06-25 DIAGNOSIS — Z85828 Personal history of other malignant neoplasm of skin: Secondary | ICD-10-CM | POA: Insufficient documentation

## 2013-06-25 DIAGNOSIS — K219 Gastro-esophageal reflux disease without esophagitis: Secondary | ICD-10-CM | POA: Insufficient documentation

## 2013-06-25 DIAGNOSIS — I2510 Atherosclerotic heart disease of native coronary artery without angina pectoris: Secondary | ICD-10-CM | POA: Insufficient documentation

## 2013-06-25 DIAGNOSIS — H7190 Unspecified cholesteatoma, unspecified ear: Secondary | ICD-10-CM | POA: Insufficient documentation

## 2013-06-25 DIAGNOSIS — Z79899 Other long term (current) drug therapy: Secondary | ICD-10-CM | POA: Insufficient documentation

## 2013-06-25 DIAGNOSIS — H906 Mixed conductive and sensorineural hearing loss, bilateral: Secondary | ICD-10-CM | POA: Insufficient documentation

## 2013-06-25 DIAGNOSIS — L723 Sebaceous cyst: Secondary | ICD-10-CM | POA: Insufficient documentation

## 2013-06-25 DIAGNOSIS — Z7902 Long term (current) use of antithrombotics/antiplatelets: Secondary | ICD-10-CM | POA: Insufficient documentation

## 2013-06-25 DIAGNOSIS — Z7982 Long term (current) use of aspirin: Secondary | ICD-10-CM | POA: Insufficient documentation

## 2013-06-25 HISTORY — PX: MASS EXCISION: SHX2000

## 2013-06-25 HISTORY — DX: Major depressive disorder, single episode, unspecified: F32.9

## 2013-06-25 HISTORY — PX: BONE ANCHORED HEARING AID IMPLANT: SHX5193

## 2013-06-25 HISTORY — DX: Depression, unspecified: F32.A

## 2013-06-25 HISTORY — DX: Anxiety disorder, unspecified: F41.9

## 2013-06-25 HISTORY — DX: Gastro-esophageal reflux disease without esophagitis: K21.9

## 2013-06-25 LAB — POCT I-STAT, CHEM 8
BUN: 31 mg/dL — ABNORMAL HIGH (ref 6–23)
Calcium, Ion: 1.19 mmol/L (ref 1.13–1.30)
Chloride: 104 mEq/L (ref 96–112)
Creatinine, Ser: 1.6 mg/dL — ABNORMAL HIGH (ref 0.50–1.35)
Glucose, Bld: 157 mg/dL — ABNORMAL HIGH (ref 70–99)
HCT: 42 % (ref 39.0–52.0)
Hemoglobin: 14.3 g/dL (ref 13.0–17.0)
Potassium: 4.1 mEq/L (ref 3.5–5.1)
Sodium: 138 mEq/L (ref 135–145)
TCO2: 23 mmol/L (ref 0–100)

## 2013-06-25 LAB — GLUCOSE, CAPILLARY: Glucose-Capillary: 162 mg/dL — ABNORMAL HIGH (ref 70–99)

## 2013-06-25 SURGERY — INSERTION, BONE ANCHORED HEARING AID
Anesthesia: General | Site: Head | Laterality: Left | Wound class: Clean

## 2013-06-25 MED ORDER — ONDANSETRON HCL 4 MG/2ML IJ SOLN: 4.0000 mg | Freq: Once | INTRAMUSCULAR | Status: DC

## 2013-06-25 MED ORDER — NEOSTIGMINE METHYLSULFATE 1 MG/ML IJ SOLN
INTRAMUSCULAR | Status: DC | PRN
  Administered 2013-06-25: 3 mg via INTRAVENOUS

## 2013-06-25 MED ORDER — SUCCINYLCHOLINE CHLORIDE 20 MG/ML IJ SOLN
INTRAMUSCULAR | Status: DC | PRN
  Administered 2013-06-25: 100 mg via INTRAVENOUS

## 2013-06-25 MED ORDER — ROCURONIUM BROMIDE 100 MG/10ML IV SOLN
INTRAVENOUS | Status: DC | PRN
  Administered 2013-06-25: 25 mg via INTRAVENOUS

## 2013-06-25 MED ORDER — CLINDAMYCIN HCL 300 MG PO CAPS: 300.0000 mg | ORAL_CAPSULE | Freq: Four times a day (QID) | ORAL | Status: DC

## 2013-06-25 MED ORDER — MIDAZOLAM HCL 2 MG/2ML IJ SOLN: 1.0000 mg | INTRAMUSCULAR | Status: DC | PRN

## 2013-06-25 MED ORDER — BACIT-POLY-NEO HC 1 % EX OINT
TOPICAL_OINTMENT | CUTANEOUS | Status: DC | PRN
  Administered 2013-06-25: 1 via TOPICAL

## 2013-06-25 MED ORDER — CLINDAMYCIN PHOSPHATE 900 MG/50ML IV SOLN
900.0000 mg | Freq: Once | INTRAVENOUS | Status: AC
  Administered 2013-06-25: 900 mg via INTRAVENOUS

## 2013-06-25 MED ORDER — LACTATED RINGERS IV SOLN
INTRAVENOUS | Status: DC
  Administered 2013-06-25: 10:00:00 via INTRAVENOUS

## 2013-06-25 MED ORDER — FENTANYL CITRATE 0.05 MG/ML IJ SOLN: 50.0000 ug | INTRAMUSCULAR | Status: DC | PRN

## 2013-06-25 MED ORDER — LIDOCAINE HCL (CARDIAC) 20 MG/ML IV SOLN
INTRAVENOUS | Status: DC | PRN
  Administered 2013-06-25: 60 mg via INTRAVENOUS

## 2013-06-25 MED ORDER — SODIUM CHLORIDE 0.9 % IR SOLN
Status: DC | PRN
  Administered 2013-06-25: 11:00:00

## 2013-06-25 MED ORDER — GLYCOPYRROLATE 0.2 MG/ML IJ SOLN
INTRAMUSCULAR | Status: DC | PRN
  Administered 2013-06-25: 0.2 mg via INTRAVENOUS
  Administered 2013-06-25: 0.4 mg via INTRAVENOUS

## 2013-06-25 MED ORDER — PROPOFOL 10 MG/ML IV BOLUS
INTRAVENOUS | Status: DC | PRN
  Administered 2013-06-25: 100 mg via INTRAVENOUS
  Administered 2013-06-25: 20 mg via INTRAVENOUS
  Administered 2013-06-25: 30 mg via INTRAVENOUS

## 2013-06-25 MED ORDER — FENTANYL CITRATE 0.05 MG/ML IJ SOLN
INTRAMUSCULAR | Status: DC | PRN
  Administered 2013-06-25: 100 ug via INTRAVENOUS

## 2013-06-25 MED ORDER — MUPIROCIN 2 % EX OINT: TOPICAL_OINTMENT | Freq: Two times a day (BID) | CUTANEOUS | Status: DC

## 2013-06-25 MED ORDER — ONDANSETRON HCL 4 MG/2ML IJ SOLN
INTRAMUSCULAR | Status: DC | PRN
  Administered 2013-06-25 (×2): 4 mg via INTRAVENOUS

## 2013-06-25 MED ORDER — LIDOCAINE-EPINEPHRINE 1 %-1:100000 IJ SOLN
INTRAMUSCULAR | Status: DC | PRN
  Administered 2013-06-25: 3.5 mL

## 2013-06-25 MED ORDER — OXYCODONE-ACETAMINOPHEN 5-325 MG PO TABS: 1.0000 | ORAL_TABLET | ORAL | Status: DC | PRN

## 2013-06-25 MED ORDER — METHYLENE BLUE 1 % INJ SOLN
INTRAMUSCULAR | Status: DC | PRN
  Administered 2013-06-25: 1 mL via SUBMUCOSAL

## 2013-06-25 MED ORDER — PHENYLEPHRINE HCL 10 MG/ML IJ SOLN
10.0000 mg | INTRAVENOUS | Status: DC | PRN
  Administered 2013-06-25: 40 ug via INTRAVENOUS

## 2013-06-25 SURGICAL SUPPLY — 50 items
BAG DECANTER FOR FLEXI CONT (MISCELLANEOUS) ×3 IMPLANT
BLADE SURG 15 STRL LF DISP TIS (BLADE) ×2 IMPLANT
BLADE SURG 15 STRL SS (BLADE) ×1
BLADE SURG ROTATE 9660 (MISCELLANEOUS) ×3 IMPLANT
CANISTER SUCTION 1200CC (MISCELLANEOUS) ×3 IMPLANT
CAP HEALING (CAP) ×3 IMPLANT
CLOTH BEACON ORANGE TIMEOUT ST (SAFETY) ×3 IMPLANT
COTTONBALL LRG STERILE PKG (GAUZE/BANDAGES/DRESSINGS) ×3 IMPLANT
COUNTERSINK WIDE 4MM (OTIC EAR SUPPLIES) ×3 IMPLANT
DECANTER SPIKE VIAL GLASS SM (MISCELLANEOUS) ×3 IMPLANT
DRAIN PENROSE 1/4X12 LTX STRL (WOUND CARE) ×3 IMPLANT
DRAPE INCISE IOBAN 66X45 STRL (DRAPES) ×3 IMPLANT
DRAPE SURG 17X23 STRL (DRAPES) ×3 IMPLANT
DRSG GLASSCOCK MASTOID ADT (GAUZE/BANDAGES/DRESSINGS) ×3 IMPLANT
ELECT COATED BLADE 2.86 ST (ELECTRODE) ×3 IMPLANT
ELECT REM PT RETURN 9FT ADLT (ELECTROSURGICAL) ×3
ELECTRODE REM PT RTRN 9FT ADLT (ELECTROSURGICAL) ×2 IMPLANT
GAUZE PACKING IODOFORM 1/4X5 (PACKING) ×3 IMPLANT
GAUZE SPONGE 4X4 16PLY XRAY LF (GAUZE/BANDAGES/DRESSINGS) ×3 IMPLANT
GLOVE ECLIPSE 7.5 STRL STRAW (GLOVE) ×3 IMPLANT
GLOVE SURG SS PI 7.0 STRL IVOR (GLOVE) ×6 IMPLANT
GOWN PREVENTION PLUS XLARGE (GOWN DISPOSABLE) ×6 IMPLANT
GUIDE DRILL 3-4MM (OTIC EAR SUPPLIES) ×3 IMPLANT
IMPLANT WIDE 4MM (Miscellaneous) ×3 IMPLANT
KIT BAHA BLADE GUIDE DRILL (KITS) IMPLANT
KIT PONTO AFTER CARE (KITS) ×3 IMPLANT
MARKER SKIN DUAL TIP RULER LAB (MISCELLANEOUS) IMPLANT
NDL SAFETY ECLIPSE 18X1.5 (NEEDLE) ×2 IMPLANT
NEEDLE HYPO 18GX1.5 SHARP (NEEDLE) ×1
NEEDLE HYPO 22GX1.5 SAFETY (NEEDLE) ×3 IMPLANT
NEEDLE HYPO 25X1 1.5 SAFETY (NEEDLE) ×3 IMPLANT
PACK BASIN DAY SURGERY FS (CUSTOM PROCEDURE TRAY) ×3 IMPLANT
PACK ENT DAY SURGERY (CUSTOM PROCEDURE TRAY) ×3 IMPLANT
PATTIES SURGICAL .5 X3 (DISPOSABLE) IMPLANT
PENCIL BUTTON HOLSTER BLD 10FT (ELECTRODE) ×3 IMPLANT
PONTO PLUS LEFT WHITE SILVER (OTIC EAR SUPPLIES) ×3 IMPLANT
PUNCH BIOPSY (OTIC EAR SUPPLIES) ×3 IMPLANT
PUNCH BIOPSY DERMAL 4MM (MISCELLANEOUS) ×3 IMPLANT
SLEEVE SCD COMPRESS KNEE MED (MISCELLANEOUS) ×3 IMPLANT
SUCTION FRAZIER TIP 10 FR DISP (SUCTIONS) ×3 IMPLANT
SUT BONE WAX W31G (SUTURE) IMPLANT
SUT CHROMIC 3 0 PS 2 (SUTURE) ×6 IMPLANT
SUT ETHILON 3 0 PS 1 (SUTURE) IMPLANT
SUT ETHILON 4 0 PS 2 18 (SUTURE) ×6 IMPLANT
SUT SILK 3 0 TIES 17X18 (SUTURE) ×1
SUT SILK 3-0 18XBRD TIE BLK (SUTURE) ×2 IMPLANT
SYR BULB 3OZ (MISCELLANEOUS) ×3 IMPLANT
SYR TB 1ML LL NO SAFETY (SYRINGE) ×3 IMPLANT
TOWEL OR 17X24 6PK STRL BLUE (TOWEL DISPOSABLE) ×3 IMPLANT
TUBE CONNECTING 20X1/4 (TUBING) ×3 IMPLANT

## 2013-06-25 NOTE — Anesthesia Preprocedure Evaluation (Addendum)
Anesthesia Evaluation  Patient identified by MRN, date of birth, ID band Patient awake    Reviewed: Allergy & Precautions, H&P , NPO status , Patient's Chart, lab work & pertinent test results, reviewed documented beta blocker date and time   Airway Mallampati: II TM Distance: >3 FB Neck ROM: Full    Dental no notable dental hx. (+) Upper Dentures, Lower Dentures and Dental Advisory Given   Pulmonary neg pulmonary ROS,  breath sounds clear to auscultation  Pulmonary exam normal       Cardiovascular hypertension, On Medications and On Home Beta Blockers + CAD and + Peripheral Vascular Disease Rhythm:Regular Rate:Normal     Neuro/Psych PSYCHIATRIC DISORDERS CVA, No Residual Symptoms    GI/Hepatic Neg liver ROS, GERD-  Medicated and Controlled,  Endo/Other  diabetes, Type 2, Oral Hypoglycemic Agents  Renal/GU negative Renal ROS  negative genitourinary   Musculoskeletal   Abdominal   Peds  Hematology negative hematology ROS (+)   Anesthesia Other Findings   Reproductive/Obstetrics negative OB ROS                          Anesthesia Physical Anesthesia Plan  ASA: III  Anesthesia Plan: General   Post-op Pain Management:    Induction: Intravenous  Airway Management Planned: Oral ETT  Additional Equipment:   Intra-op Plan:   Post-operative Plan: Extubation in OR  Informed Consent: I have reviewed the patients History and Physical, chart, labs and discussed the procedure including the risks, benefits and alternatives for the proposed anesthesia with the patient or authorized representative who has indicated his/her understanding and acceptance.   Dental advisory given  Plan Discussed with: CRNA  Anesthesia Plan Comments:         Anesthesia Quick Evaluation

## 2013-06-25 NOTE — Transfer of Care (Signed)
Immediate Anesthesia Transfer of Care Note  Patient: John [name]  Procedure(s) Performed: Procedure(s): BONE ANCHORED HEARING AID (BAHA) IMPLANT (Left) EXCISION LEFT TEMPORAL MASS (Left)  Patient Location: PACU  Anesthesia Type:General  Level of Consciousness: awake, alert  and patient cooperative  Airway & Oxygen Therapy: Patient Spontanous Breathing and Patient connected to face mask oxygen  Post-op Assessment: Report given to PACU RN and Post -op Vital signs reviewed and stable  Post vital signs: Reviewed and stable  Complications: No apparent anesthesia complications

## 2013-06-25 NOTE — Brief Op Note (Signed)
06/25/2013  12:17 PM  PATIENT:  Tama High  72 y.o. male  PRE-OPERATIVE DIAGNOSIS:  MIXED HEARING LOSS BILATERAL, LEFT TEMPORAL MASS  POST-OPERATIVE DIAGNOSIS:  MIXED HEARING LOSS BILATERAL, LEFT TEMPORAL MASS  PROCEDURE:  Procedure(s): BONE ANCHORED HEARING AID (BAHA) IMPLANT (Left) EXCISION LEFT TEMPORAL MASS (Left)  SURGEON:  Surgeon(s) and Role:    * Fannie Knee, MD - Primary  PHYSICIAN ASSISTANT:   ASSISTANTS: none   ANESTHESIA:   general  EBL:  Total I/O In: 500 [I.V.:500] Out: -   BLOOD ADMINISTERED:none  DRAINS: Penrose drain in the L temporal excision site   LOCAL MEDICATIONS USED:  XYLOCAINE    SPECIMEN:  Excision  DISPOSITION OF SPECIMEN:  PATHOLOGY  COUNTS:  YES  TOURNIQUET:  * No tourniquets in log *  DICTATION: .Other Dictation: Dictation Number [id number]  PLAN OF CARE: Discharge to home after PACU  PATIENT DISPOSITION:  PACU - hemodynamically stable.   Delay start of Pharmacological VTE agent (>24hrs) due to surgical blood loss or risk of bleeding: yes

## 2013-06-25 NOTE — Anesthesia Procedure Notes (Signed)
Procedure Name: Intubation Date/Time: 06/25/2013 9:51 AM Performed by: Lyndee Leo Pre-anesthesia Checklist: Patient identified, Emergency Drugs available, Suction available and Patient being monitored Patient Re-evaluated:Patient Re-evaluated prior to inductionOxygen Delivery Method: Circle System Utilized Preoxygenation: Pre-oxygenation with 100% oxygen Intubation Type: IV induction Ventilation: Mask ventilation without difficulty Laryngoscope Size: Miller and 2 Grade View: Grade II Tube type: Oral Tube size: 8.0 mm Number of attempts: 1 Airway Equipment and Method: stylet and oral airway Placement Confirmation: ETT inserted through vocal cords under direct vision,  positive ETCO2 and breath sounds checked- equal and bilateral Secured at: 21 cm Tube secured with: Tape Dental Injury: Teeth and Oropharynx as per pre-operative assessment

## 2013-06-25 NOTE — Interval H&P Note (Signed)
1. The patient has been re-examined this morning.There have been no changes in status. 2. The H&P has been reviewed. 3. No changes are recommended in the plan of care.History and Physical Interval Note: 4. OR permit changed from "BAHA" to "Ponto" implant. I and the patient have initialed the change on the operative permit for correctness.  06/25/2013 9:30 AM  John [name]  has presented today for surgery, with the diagnosis of MIXED HEARING LOSS BILATERAL, LEFT TEMPORAL MASS  The various methods of treatment have been discussed with the patient and family. After consideration of risks, benefits and other options for treatment, the patient has consented to  Procedure(s): BONE ANCHORED HEARING AID (BAHA) IMPLANT (Left) EXCISION LEFT TEMPORAL MASS (Left) as a surgical intervention .  The patient's history has been reviewed, patient examined, no change in status, stable for surgery.  I have reviewed the patient's chart and labs.  Questions were answered to the patient's satisfaction.   **The above highlighted in blue is incorrect - the patient is having a Left Ponto hearing implant, not a BAHA implant. I was unable to change the highlighted area in Epic. Fannie Knee, M.D., 06-25-13 at 9:34am.    Thornell Mule, [place]

## 2013-06-26 NOTE — Op Note (Signed)
NAME:  John [name], John [name] NO.:  [id number]  MEDICAL RECORD NO.:  [id number]  LOCATION:                               FACILITY:  [facility]  PHYSICIAN:  Fannie Knee, M.D.    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  The patient has had mastoid procedure on the left and an atticotomy on the right and had on February 09, 2013, audiometric testing documenting an SRT of 45 dB AU with 96% discrimination, right ear and 92% discrimination, left ear.  He had failed hearing aids and was recommended for a Ponto hearing implant, left temporal bone.  The patient was also found to have a 6 cm egg-shaped left subcutaneous mass in the left temporal area just in the area superior to his left sideburn.  Mass was doughy on palpation and was thought to either be an epidermal inclusion cyst or a large lipoma.  The patient stated that the mass was growing slowly.  Therefore, he was recommended for the left Ponto osseointegrated hearing implant and excision of the left temporal mass under general endotracheal anesthesia.  The patient has a history of cardiac disease, and underwent a repeat echocardiogram and was evaluated by Dr. [name].  He underwent preanesthesia clearance and had been taken off his anticoagulants prior to the procedure.  Risks, complications, and alternatives of the procedures were explained to the patient and to his cousin, Susie, complications.  Her questions were invited and answered.  Informed consent was signed and witnessed.  Justification for outpatient settings, patient's age, need for  general endotracheal anesthesia.  Justification for overnight stays not applicable.  PREOPERATIVE DIAGNOSES: 1. 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 2. Left temporal mass.  POSTOPERATIVE DIAGNOSES: 1. 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 2. Left temporal mass.  PROCEDURE: 1. Left Ponto osseointegrated hearing implant. 2. Excision of left temporal mass.  SURGEON:  Fannie Knee, M.D.  ANESTHESIA:  General endotracheal, Dr. Ola Spurr, CRNA Simona Huh.  COMPLICATIONS:  None.  SUMMARY OF REPORT:  After the patient was taken to the operating room, he was placed in the supine position.  General IV induction was then performed by Dr. Ola Spurr.  The patient was then orally intubated by Simona Huh without difficulty.  Eyelids were taped shut.  He was properly positioned and monitored.  Elbows and ankles were padded with foam rubber and I initiated a time-out.  Hair was clipped and a left periauricular area to expose the left temporal mass and to expose an area posterior to the left auricle.  His hair was taped with a 1000 drape and a stockinette cap was applied.  The skin was cleansed with 70% isopropyl alcohol.  A Ponto dummy sound processor  was then used to locate a site for the Ponto implant, 55-mm posterior superior to the left external auditory canal.  Room was left for his auricle to fold posteriorly and for him to wear sunglasses and a hat.  The site was marked and then a 4 cm incision was marked 2 cm on either side of the site for the abutment.  There was a 6 cm egg-shaped subcutaneous masses in the left temporal area just superior to the sideburn.  A 6 cm incision was marked in this location, a vertical incision with the inferior portion of the incision just in the preauricular crease.  Both incisions were then infiltrated with 1% Xylocaine with 1:100,000 epinephrine for a total  of 3.5 mL.  The patient's left scalp, hemiface, and ear were then prepped with Betadine and draped in a standard fashion.  Methylene blue dye was then used to tattoo the abutment site.  A 4 mm dermatologic punch was then used to punch out disk of skin in the abutment site.  The incision was then made with a 15 blade and carried down to periosteum.  A cruciate incision was made in the periosteum and 4 periosteal flaps were elevated with a raspatorium.  A 3-mm guide hole was then drilled with the Ponto drill set at 2000 rpm's using continuous suction irrigation, a 4 mm hole was then drilled and solid bone was present.  A 4 mm countersink hole was then drilled also at 2000 rpm's with continuous suction irrigation.  A 9 mm long 4 mm wide titanium Ponto hearing implant was then attached to the abutment inserter and inserted again with suction irrigation at 45 Newton centimeters of torque.  The abutment was then hand tightened. Four flaps were then rotated back around the abutment.  Small amount of subcutaneous fat was then removed from around the abutment on both sides of the incision.  Bleeding was controlled with electrocautery and the site was copiously irrigated with bacitracin containing saline.  The incision was closed in 2 layers using interrupted inverted 3-0 chromic for subcutaneous layer and skin was closed with interrupted 4-0 Ethilon.  Bacitracin ointment was applied in the incision line.  Left temporal mass: There was a 6 cm doughy left temporal mass which is size of an egg just superior to the left sideburn.  A 6 cm vertical incision was then made and carried down to the capsule of the mass.  The mass was then dissected from the surrounding tissue and was found to be twice as large as it appeared on manual palpation.  Mass was attached to the temporalis muscle as well as to the subcutaneous tissue.  The mass was carefully dissected.  The superficial temporal artery was  running through the mass and was ligated with 3-0 silk ties.  Once the mass was completely excised with blunt and sharp dissection, the site was copiously irrigated with bacitracin containing saline.  Bleeding was controlled with unipolar cautery.  A sterile Penrose drain was placed in the inferior portion of the cavity.  Dead space was closed with the aid of the thickened capsule with interrupted 3-0 chromics and skin was closed with interrupted inverted 3-0 chromics and the subcutaneous layer was closed with interrupted inverted 3-0 chromic, and skin was closed with a running locking 4-0 Ethilon.  Bacitracin ointment was applied.  The ear was then padded with Telfa and cotton and a standard adult Glasscock mastoid dressing was applied loosely in the standard fashion.  The patient was  then awakened, extubated, and transferred to his hospital bed.  He appeared to tolerate the general endotracheal anesthesia and the procedure well and left the operating room in stable condition.  TOTAL FLUIDS:  750 mL.  TOTAL BLOOD LOSS:  Less than 10 mL.  Sponge, needle, and cotton ball counts were correct at the termination of the procedure.  The left temporal mass which was an epidermal inclusion cyst, which had ruptured during portion of the procedure, was sent to pathology.  The patient received clindamycin 900 mg IV at the beginning of the procedure, and Zofran 4 mg IV at the beginning and end of the procedure.  John [name] will be admitted to the PACU, then will be discharged home today with his cousins Daine Floras and her brother.  He will be asked to return him to my office tomorrow for drain removal, July 06, 2013, at 1:25 p.m. for followup and suture removal.  DISCHARGE MEDICATIONS: 1. Clindamycin 300 mg p.o. q.i.d. x10 days with food. 2. Vicodin 5/300 #30 one to two p.o. q.4-6 hours p.r.n. pain. 3. Mupirocin ointment 2% applied to the 2 incisions b.i.d. x1 week.  He is to follow a  soft diet today, regular diet tomorrow.  Keep his head elevated, and avoid aspirin or aspirin products.  He is to take all of his home medications as per his cardiologist.  He is to call [phone] for any postoperative problems directly related to the procedure.  His family was given both verbal and written instructions.     Fannie Knee, M.D.   ______________________________ Fannie Knee, M.D.    EMK/MEDQ  D:  06/25/2013  T:  06/26/2013  Job:  [id number]

## 2013-06-26 NOTE — Anesthesia Postprocedure Evaluation (Signed)
  Anesthesia Post-op Note  Patient: John [name]  Procedure(s) Performed: Procedure(s): BONE ANCHORED HEARING AID (BAHA) IMPLANT (Left) EXCISION LEFT TEMPORAL MASS (Left)  Patient Location: PACU  Anesthesia Type:General  Level of Consciousness: awake and alert   Airway and Oxygen Therapy: Patient Spontanous Breathing  Post-op Pain: none  Post-op Assessment: Post-op Vital signs reviewed, Patient's Cardiovascular Status Stable, Respiratory Function Stable, Patent Airway and No signs of Nausea or vomiting  Post-op Vital Signs: Reviewed and stable  Complications: No apparent anesthesia complications

## 2013-06-29 ENCOUNTER — Encounter (HOSPITAL_BASED_OUTPATIENT_CLINIC_OR_DEPARTMENT_OTHER): Payer: Self-pay | Admitting: Otolaryngology

## 2013-06-30 ENCOUNTER — Encounter: Payer: Self-pay | Admitting: Cardiology

## 2013-06-30 ENCOUNTER — Ambulatory Visit (INDEPENDENT_AMBULATORY_CARE_PROVIDER_SITE_OTHER): Payer: Medicare Other | Admitting: Cardiology

## 2013-06-30 VITALS — BP 130/76 | HR 60 | Ht 67.0 in | Wt 189.1 lb

## 2013-06-30 DIAGNOSIS — I255 Ischemic cardiomyopathy: Secondary | ICD-10-CM

## 2013-06-30 DIAGNOSIS — I25810 Atherosclerosis of coronary artery bypass graft(s) without angina pectoris: Secondary | ICD-10-CM

## 2013-06-30 DIAGNOSIS — I2589 Other forms of chronic ischemic heart disease: Secondary | ICD-10-CM

## 2013-06-30 DIAGNOSIS — I739 Peripheral vascular disease, unspecified: Secondary | ICD-10-CM

## 2013-06-30 DIAGNOSIS — E785 Hyperlipidemia, unspecified: Secondary | ICD-10-CM

## 2013-06-30 DIAGNOSIS — I10 Essential (primary) hypertension: Secondary | ICD-10-CM

## 2013-06-30 NOTE — Patient Instructions (Signed)
Your physician wants you to follow-up in:  12 months.  You will receive a reminder letter in the mail two months in advance. If you don't receive a letter, please call our office to schedule the follow-up appointment.   

## 2013-06-30 NOTE — Assessment & Plan Note (Signed)
Continue aspirin and statin. 

## 2013-06-30 NOTE — Assessment & Plan Note (Signed)
Continue statin. Lipids and liver monitored by primary care. 

## 2013-06-30 NOTE — Progress Notes (Signed)
HPI: Pleasant male for fu of CAD. Patient states he suffered a myocardial infarction at age 72 in [place]. He had a cardiac catheterization but medical therapy was recommended. Cardiac catheterization in June of 2013 in [place] showed EF 40% and there was hypokinesis of the anterior wall. There was a 30-40% mid right coronary artery and a 30-40% mid PDA. The left main was normal. There was an 80% proximal LAD and a 70-80% mid lesion. The circumflex had a 90% lesion after the origin of a first marginal but continued mainly as a moderate size atrial branch. The patient had PCI of his LAD with 2 Ion drug-eluting stents. Medical therapy recommended for the circumflex. In July of 2013 the patient had an aortogram because of claudication and abnormal ABIs. There was no abdominal aortic aneurysm and the renal arteries were normal. There is a 50% distal common iliac on the left. The SFA had multiple lesions from 50-70%. 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 There was a 70-80% proximal right common iliac artery. 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 The SFA had a 70-80% lesion. The anterior tibial had a 50-60% lesion. The patient had a covered stent to the common iliac on the right. If the patient had persistent symptoms there was plan for her intervention to the SFA on the right. Medical therapy recommended for the left. Last echocardiogram in March of 2014 showed EF 45-50%, mild left atrial enlargement and trace mitral regurgitation. Since I last saw him, the patient denies any dyspnea on exertion, orthopnea, PND, pedal edema, palpitations, syncope or chest pain. He has some pain in his hips bilaterally with ambulation after 100 yards.   Current Outpatient Prescriptions  Medication Sig Dispense Refill  . allopurinol (ZYLOPRIM) 300 MG tablet Take 1 tablet by mouth Daily.      Marland Kitchen ALPRAZolam (XANAX) 0.5 MG tablet Take 0.5 mg by mouth at bedtime as needed for sleep.      Marland Kitchen  amLODipine (NORVASC) 5 MG tablet Take 1 tablet (5 mg total) by mouth daily.  90 tablet  4  . carvedilol (COREG) 12.5 MG tablet Take 1 tablet (12.5 mg total) by mouth 2 (two) times daily with a meal.  180 tablet  4  . clindamycin (CLEOCIN) 300 MG capsule Take 1 capsule (300 mg total) by mouth 4 (four) times daily.  40 capsule  0  . CRESTOR 20 MG tablet Take 1 tablet by mouth Daily.      Marland Kitchen DIOVAN 320 MG tablet Take 1 tablet by mouth Daily.      Marland Kitchen escitalopram (LEXAPRO) 10 MG tablet Take 10 mg by mouth daily.      Marland Kitchen glipiZIDE (GLUCOTROL XL) 2.5 MG 24 hr tablet Take 2.5 mg by mouth 2 (two) times daily.      Marland Kitchen glipiZIDE (GLUCOTROL XL) 5 MG 24 hr tablet Take 5 mg by mouth 2 (two) times daily.      . hydrochlorothiazide (MICROZIDE) 12.5 MG capsule Take 1 tablet by mouth Daily.      . metFORMIN (GLUCOPHAGE-XR) 500 MG 24 hr tablet Take 1,000 mg by mouth BID times 48H.       . mupirocin ointment (BACTROBAN) 2 % Apply topically 2 (two) times daily. Apply to both incisions twice per day for 1 week  22 g  0  . oxyCODONE-acetaminophen (PERCOCET/ROXICET) 5-325 MG per tablet Take 1 tablet by mouth every 4 (four) hours as needed for pain.  30 tablet  0  .  pantoprazole (PROTONIX) 40 MG tablet Take 40 mg by mouth daily.      Marland Kitchen SPIRIVA HANDIHALER 18 MCG inhalation capsule PRN      . Tamsulosin HCl (FLOMAX) 0.4 MG CAPS Take 1 tablet by mouth Daily.      . clopidogrel (PLAVIX) 75 MG tablet Take 1 tablet by mouth Daily.       No current facility-administered medications for this visit.     Past Medical History  Diagnosis Date  . Cholesteatoma of attic   . CAD (coronary artery disease)   . Ear disease   . Stroke   . Diabetes mellitus   . PVD (peripheral vascular disease)   . Hypertension   . Hyperlipidemia   . 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 BPH (benign prostatic hyperplasia)   . Anxiety   . Depression   . GERD (gastroesophageal reflux disease)     Past Surgical History  Procedure Laterality Date  . Coronary  stent placement    . Cochlear implant    . Tonsillectomy    . Carotid stent insertion Left   . Bone anchored hearing aid implant Left 06/25/2013    Procedure: BONE ANCHORED HEARING AID (BAHA) IMPLANT;  Surgeon: Fannie Knee, MD;  Location: [facility];  Service: ENT;  Laterality: Left;  Marland Kitchen Mass excision Left 06/25/2013    Procedure: EXCISION LEFT TEMPORAL MASS;  Surgeon: Fannie Knee, MD;  Location: [facility];  Service: ENT;  Laterality: Left;    History   Social History  . Marital Status: Widowed    Spouse Name: N/A    Number of Children: 3  . Years of Education: N/A   Occupational History  . Not on file.   Social History Main Topics  . Smoking status: Former Smoker    Quit date: 05/19/1989  . Smokeless tobacco: Not on file  . Alcohol Use: Yes     Comment: 2-3 glasses wine per day  . Drug Use: No  . Sexual Activity: Not on file   Other Topics Concern  . Not on file   Social History Narrative  . No narrative on file    ROS: no fevers or chills, productive cough, hemoptysis, dysphasia, odynophagia, melena, hematochezia, dysuria, hematuria, rash, seizure activity, orthopnea, PND, pedal edema, claudication. Remaining systems are negative.  Physical Exam: Well-developed well-nourished in no acute distress.  Skin is warm and dry.  HEENT is normal.  Neck is supple.  Chest is clear to auscultation with normal expansion.  Cardiovascular exam is regular rate and rhythm.  Abdominal exam nontender or distended. No masses palpated. Extremities show no edema. neuro grossly intact  06/25/2013-sinus rhythm with occasional PVCs.

## 2013-06-30 NOTE — Assessment & Plan Note (Signed)
Continue aspirin and statin. If his symptoms progress we will consider referral to one of our vascular physicians.

## 2013-06-30 NOTE — Assessment & Plan Note (Signed)
Continue ARB and beta blocker. 

## 2013-06-30 NOTE — Assessment & Plan Note (Signed)
Blood pressure controlled. Continue present medications. Potassium and renal function monitored by primary care. 

## 2013-08-27 ENCOUNTER — Other Ambulatory Visit: Payer: Self-pay

## 2013-09-30 ENCOUNTER — Other Ambulatory Visit: Payer: Self-pay | Admitting: Cardiology

## 2013-12-25 ENCOUNTER — Other Ambulatory Visit: Payer: Self-pay | Admitting: Cardiology

## 2014-09-11 LAB — ABI

## 2014-09-22 ENCOUNTER — Other Ambulatory Visit: Payer: Self-pay | Admitting: Cardiology

## 2014-09-22 NOTE — Telephone Encounter (Signed)
Need appointment before anymore refills

## 2014-11-18 ENCOUNTER — Other Ambulatory Visit: Payer: Self-pay | Admitting: Cardiology

## 2014-11-18 NOTE — Telephone Encounter (Signed)
Rx has been sent to the pharmacy electronically. ° °

## 2014-11-18 NOTE — Telephone Encounter (Signed)
Pt need a new prescription for Amlodipine 5 mg #90 and refills please. Please send this to [facility].

## 2014-12-21 ENCOUNTER — Other Ambulatory Visit: Payer: Self-pay | Admitting: Cardiology

## 2015-01-13 ENCOUNTER — Other Ambulatory Visit: Payer: Self-pay | Admitting: Cardiology

## 2015-02-14 ENCOUNTER — Ambulatory Visit: Payer: Medicare Other | Admitting: Physician Assistant

## 2015-05-08 ENCOUNTER — Other Ambulatory Visit: Payer: Self-pay | Admitting: Cardiology

## 2015-05-09 ENCOUNTER — Other Ambulatory Visit: Payer: Self-pay

## 2015-05-09 MED ORDER — CARVEDILOL 12.5 MG PO TABS: 12.5000 mg | ORAL_TABLET | Freq: Two times a day (BID) | ORAL | Status: DC

## 2015-05-09 NOTE — Progress Notes (Signed)
HPI: FU CAD. Patient states he suffered a myocardial infarction at age 74 in [place]. He had a cardiac catheterization but medical therapy was recommended. Cardiac catheterization in June of 2013 in [place] showed EF 40% and there was hypokinesis of the anterior wall. There was a 30-40% mid right coronary artery and a 30-40% mid PDA. The left main was normal. There was an 80% proximal LAD and a 70-80% mid lesion. The circumflex had a 90% lesion after the origin of a first marginal but continued mainly as a moderate size atrial branch. The patient had PCI of his LAD with 2 Ion drug-eluting stents. Medical therapy recommended for the circumflex. In July of 2013 the patient had an aortogram because of claudication and abnormal ABIs. There was no abdominal aortic aneurysm and the renal arteries were normal. There is a 50% distal common iliac on the left. The SFA had multiple lesions from 50-70%. 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 There was a 70-80% proximal right common iliac artery. 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 The SFA had a 70-80% lesion. The anterior tibial had a 50-60% lesion. The patient had a covered stent to the common iliac on the right. If the patient had persistent symptoms there was plan for her intervention to the SFA on the right. Medical therapy recommended for the left. Last echocardiogram in March of 2014 showed EF 45-50%, mild left atrial enlargement and trace mitral regurgitation. Since I last saw him, he denies dyspnea, chest pain, palpitations or syncope. He does have pain in his hips and legs with ambulation.  Current Outpatient Prescriptions  Medication Sig Dispense Refill  . ADVAIR DISKUS 250-50 MCG/DOSE AEPB Inhale 1 puff into the lungs daily. Inhale 1 puff daily    . allopurinol (ZYLOPRIM) 300 MG tablet Take 1 tablet by mouth Daily.    Marland Kitchen ALPRAZolam (XANAX) 0.5 MG tablet Take 0.5 mg by mouth at bedtime as needed for sleep.     Marland Kitchen amLODipine (NORVASC) 5 MG tablet Take 1 tablet (5 mg total) by mouth daily. Need appointment before anymore refills 15 tablet 0  . aspirin 81 MG tablet Take 81 mg by mouth daily. Take 1 tab daily    . carvedilol (COREG) 12.5 MG tablet Take 1 tablet (12.5 mg total) by mouth 2 (two) times daily with a meal. 180 tablet 3  . CRESTOR 20 MG tablet Take 1 tablet by mouth Daily.    Marland Kitchen DIOVAN 320 MG tablet Take 1 tablet by mouth Daily.    Marland Kitchen glimepiride (AMARYL) 4 MG tablet Take 1 tablet by mouth daily. Take 1 tab daily    . glucose blood (BAYER CONTOUR NEXT TEST) test strip 1 strip by Other route 2 (two) times daily. Use 1 strip to check glucose twice a day    . INVOKANA 300 MG TABS tablet Take 1 tablet by mouth daily. Take 1 tab daily    . Lactobacillus (ACIDOPHILUS) CAPS capsule Take 1 capsule by mouth daily. Take 1 cap daily    . metFORMIN (GLUCOPHAGE-XR) 500 MG 24 hr tablet Take 1,000 mg by mouth BID times 48H.     . pantoprazole (PROTONIX) 40 MG tablet Take 40 mg by mouth daily.    Marland Kitchen SPIRIVA HANDIHALER 18 MCG inhalation capsule PRN    . Tamsulosin HCl (FLOMAX) 0.4 MG CAPS Take 1 tablet by mouth Daily.     No current facility-administered medications for this visit.     Past  Medical History  Diagnosis Date  . Cholesteatoma of attic   . CAD (coronary artery disease)   . Ear disease   . Stroke   . Diabetes mellitus   . PVD (peripheral vascular disease)   . Hypertension   . Hyperlipidemia   . 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 BPH (benign prostatic hyperplasia)   . Anxiety   . Depression   . GERD (gastroesophageal reflux disease)     Past Surgical History  Procedure Laterality Date  . Coronary stent placement    . Cochlear implant    . Tonsillectomy    . Carotid stent insertion Left   . Bone anchored hearing aid implant Left 06/25/2013    Procedure: BONE ANCHORED HEARING AID (BAHA) IMPLANT;  Surgeon: Fannie Knee, MD;  Location: [facility];  Service: ENT;  Laterality: Left;  Marland Kitchen  Mass excision Left 06/25/2013    Procedure: EXCISION LEFT TEMPORAL MASS;  Surgeon: Fannie Knee, MD;  Location: [facility];  Service: ENT;  Laterality: Left;    History   Social History  . Marital Status: Widowed    Spouse Name: N/A  . Number of Children: 3  . Years of Education: N/A   Occupational History  . Not on file.   Social History Main Topics  . Smoking status: Former Smoker    Quit date: 05/19/1989  . Smokeless tobacco: Not on file  . Alcohol Use: Yes     Comment: 2-3 glasses wine per day  . Drug Use: No  . Sexual Activity: Not on file   Other Topics Concern  . Not on file   Social History Narrative    ROS: no fevers or chills, productive cough, hemoptysis, dysphasia, odynophagia, melena, hematochezia, dysuria, hematuria, rash, seizure activity, orthopnea, PND, pedal edema, claudication. Remaining systems are negative.  Physical Exam: Well-developed well-nourished in no acute distress.  Skin is warm and dry.  HEENT is normal.  Neck is supple.  Chest is clear to auscultation with normal expansion.  Cardiovascular exam is regular rate and rhythm.  Abdominal exam nontender or distended. No masses palpated. Extremities show no edema. neuro grossly intact  ECG sinus rhythm with PVCs. No ST changes.

## 2015-05-11 ENCOUNTER — Other Ambulatory Visit: Payer: Self-pay | Admitting: *Deleted

## 2015-05-11 MED ORDER — CARVEDILOL 12.5 MG PO TABS: 12.5000 mg | ORAL_TABLET | Freq: Two times a day (BID) | ORAL | Status: DC

## 2015-05-12 ENCOUNTER — Other Ambulatory Visit: Payer: Self-pay | Admitting: *Deleted

## 2015-05-12 MED ORDER — CARVEDILOL 12.5 MG PO TABS: 12.5000 mg | ORAL_TABLET | Freq: Two times a day (BID) | ORAL | Status: DC

## 2015-05-12 NOTE — Telephone Encounter (Signed)
Rx(s) sent to pharmacy electronically.  

## 2015-05-13 ENCOUNTER — Encounter: Payer: Self-pay | Admitting: Cardiology

## 2015-05-13 ENCOUNTER — Ambulatory Visit (INDEPENDENT_AMBULATORY_CARE_PROVIDER_SITE_OTHER): Payer: Medicare Other | Admitting: Cardiology

## 2015-05-13 VITALS — BP 94/60 | HR 70 | Ht 68.0 in | Wt 182.4 lb

## 2015-05-13 DIAGNOSIS — I2510 Atherosclerotic heart disease of native coronary artery without angina pectoris: Secondary | ICD-10-CM | POA: Diagnosis not present

## 2015-05-13 DIAGNOSIS — I25700 Atherosclerosis of coronary artery bypass graft(s), unspecified, with unstable angina pectoris: Secondary | ICD-10-CM | POA: Diagnosis not present

## 2015-05-13 DIAGNOSIS — I255 Ischemic cardiomyopathy: Secondary | ICD-10-CM

## 2015-05-13 DIAGNOSIS — I739 Peripheral vascular disease, unspecified: Secondary | ICD-10-CM | POA: Diagnosis not present

## 2015-05-13 DIAGNOSIS — I10 Essential (primary) hypertension: Secondary | ICD-10-CM

## 2015-05-13 NOTE — Patient Instructions (Signed)
Your physician wants you to follow-up in: [place] will receive a reminder letter in the mail two months in advance. If you don't receive a letter, please call our office to schedule the follow-up appointment.   STOP AMLODIPINE

## 2015-05-13 NOTE — Assessment & Plan Note (Addendum)
Blood pressure is low. Continue ARB and beta blocker but discontinue Norvasc and follow.

## 2015-05-13 NOTE — Assessment & Plan Note (Signed)
Continue aspirin and statin. Patient does have claudication. He recently had ABIs with Doppler in [facility] with his primary care physician. I will have those records forwarded to [place]. We can consider referral for angiography if needed.

## 2015-05-13 NOTE — Assessment & Plan Note (Deleted)
Continue aspirin and statin. 

## 2015-05-13 NOTE — Assessment & Plan Note (Signed)
Continue statin. Lipids and liver monitored by primary care. 

## 2015-05-13 NOTE — Assessment & Plan Note (Signed)
Continue aspirin and statin. 

## 2015-05-13 NOTE — Assessment & Plan Note (Signed)
Continue ARB and beta blocker. 

## 2016-06-04 ENCOUNTER — Other Ambulatory Visit: Payer: Self-pay | Admitting: Cardiology

## 2016-06-05 NOTE — Telephone Encounter (Signed)
Pt needs to call the office to schedule a follow up appointment for more refills.[phone] (1st attempt)

## 2016-07-27 ENCOUNTER — Encounter: Payer: Self-pay | Admitting: Cardiology

## 2016-07-27 ENCOUNTER — Ambulatory Visit (INDEPENDENT_AMBULATORY_CARE_PROVIDER_SITE_OTHER): Payer: Medicare Other | Admitting: Cardiology

## 2016-07-27 VITALS — BP 134/70 | HR 79 | Ht 68.0 in | Wt 188.0 lb

## 2016-07-27 DIAGNOSIS — E784 Other hyperlipidemia: Secondary | ICD-10-CM | POA: Diagnosis not present

## 2016-07-27 DIAGNOSIS — I10 Essential (primary) hypertension: Secondary | ICD-10-CM

## 2016-07-27 DIAGNOSIS — I255 Ischemic cardiomyopathy: Secondary | ICD-10-CM | POA: Diagnosis not present

## 2016-07-27 DIAGNOSIS — I2510 Atherosclerotic heart disease of native coronary artery without angina pectoris: Secondary | ICD-10-CM | POA: Diagnosis not present

## 2016-07-27 DIAGNOSIS — E7849 Other hyperlipidemia: Secondary | ICD-10-CM

## 2016-07-27 NOTE — Progress Notes (Signed)
HPI: FU CAD. Patient states he suffered a myocardial infarction at age 75 in [place]. He had a cardiac catheterization but medical therapy was recommended. Cardiac catheterization in June of 2013 in [place] showed EF 40% and there was hypokinesis of the anterior wall. There was a 30-40% mid right coronary artery and a 30-40% mid PDA. The left main was normal. There was an 80% proximal LAD and a 70-80% mid lesion. The circumflex had a 90% lesion after the origin of a first marginal but continued mainly as a moderate size atrial branch. The patient had PCI of his LAD with 2 Ion drug-eluting stents. Medical therapy recommended for the circumflex. In July of 2013 the patient had an aortogram because of claudication and abnormal ABIs. There was no abdominal aortic aneurysm and the renal arteries were normal. There is a 50% distal common iliac on the left. The SFA had multiple lesions from 50-70%. 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 There was a 70-80% proximal right common iliac artery. 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 The SFA had a 70-80% lesion. The anterior tibial had a 50-60% lesion. The patient had a covered stent to the common iliac on the right. If the patient had persistent symptoms there was plan for her intervention to the SFA on the right. Medical therapy recommended for the left. Last echocardiogram in March of 2014 showed EF 45-50%, mild left atrial enlargement and trace mitral regurgitation. Since I last saw him, he has dyspnea with more extreme activities but not routine activities. No orthopnea, PND, pedal edema, claudication, chest pain or syncope.  Current Outpatient Prescriptions  Medication Sig Dispense Refill  . ADVAIR DISKUS 250-50 MCG/DOSE AEPB Inhale 1 puff into the lungs daily. Inhale 1 puff daily    . allopurinol (ZYLOPRIM) 300 MG tablet Take 1 tablet by mouth Daily.    Marland Kitchen ALPRAZolam (XANAX) 0.5 MG tablet Take 0.5 mg by mouth at  bedtime as needed for sleep.    Marland Kitchen aspirin 81 MG tablet Take 81 mg by mouth daily. Take 1 tab daily    . carvedilol (COREG) 12.5 MG tablet Take 1 tablet (12.5 mg total) by mouth 2 (two) times daily with a meal. Pt needs to call the office to schedule a follow up appointment for more [phone] 180 tablet 0  . clopidogrel (PLAVIX) 75 MG tablet Take 75 mg by mouth daily.    . CRESTOR 20 MG tablet Take 1 tablet by mouth Daily.    Marland Kitchen DIOVAN 320 MG tablet Take 1 tablet by mouth Daily.    Marland Kitchen glimepiride (AMARYL) 4 MG tablet Take 1 tablet by mouth daily. Take 1 tab daily    . INVOKANA 300 MG TABS tablet Take 1 tablet by mouth daily. Take 1 tab daily    . Lactobacillus (ACIDOPHILUS) CAPS capsule Take 1 capsule by mouth daily. Take 1 cap daily    . metFORMIN (GLUCOPHAGE-XR) 500 MG 24 hr tablet Take 1,000 mg by mouth BID times 48H.     . pantoprazole (PROTONIX) 40 MG tablet Take 40 mg by mouth daily.    Marland Kitchen SPIRIVA HANDIHALER 18 MCG inhalation capsule PRN    . Tamsulosin HCl (FLOMAX) 0.4 MG CAPS Take 1 tablet by mouth Daily.     No current facility-administered medications for this visit.      Past Medical History:  Diagnosis Date  . Anxiety   . 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 BPH (benign prostatic hyperplasia)   .  CAD (coronary artery disease)   . Cholesteatoma of attic   . Depression   . Diabetes mellitus ([place])   . Ear disease   . GERD (gastroesophageal reflux disease)   . Hyperlipidemia   . Hypertension   . PVD (peripheral vascular disease) ([place])   . Stroke [facility])     Past Surgical History:  Procedure Laterality Date  . BONE ANCHORED HEARING AID IMPLANT Left 06/25/2013   Procedure: BONE ANCHORED HEARING AID (BAHA) IMPLANT;  Surgeon: Fannie Knee, MD;  Location: [place] [facility];  Service: ENT;  Laterality: Left;  . CAROTID STENT INSERTION Left   . COCHLEAR IMPLANT    . CORONARY STENT PLACEMENT    . MASS EXCISION Left 06/25/2013   Procedure: EXCISION LEFT TEMPORAL MASS;  Surgeon: Fannie Knee, MD;  Location: [place];  Service: ENT;  Laterality: Left;  . TONSILLECTOMY      Social History   Social History  . Marital status: Widowed    Spouse name: N/A  . Number of children: 3  . Years of education: N/A   Occupational History  . Not on file.   Social History Main Topics  . Smoking status: Former Smoker    Quit date: 05/19/1989  . Smokeless tobacco: Never Used  . Alcohol use Yes     Comment: 2-3 glasses wine per day  . Drug use: No  . Sexual activity: Not on file   Other Topics Concern  . Not on file   Social History Narrative  . No narrative on file    Family History  Problem Relation Age of Onset  . Diabetes      ROS: no fevers or chills, productive cough, hemoptysis, dysphasia, odynophagia, melena, hematochezia, dysuria, hematuria, rash, seizure activity, orthopnea, PND, pedal edema, claudication. Remaining systems are negative.  Physical Exam: Well-developed well-nourished in no acute distress.  Skin is warm and dry.  HEENT is normal.  Neck is supple.  Chest is clear to auscultation with normal expansion.  Cardiovascular exam is regular rate and rhythm.  Abdominal exam nontender or distended. No masses palpated. Extremities show no edema. neuro grossly intact  ECG-Sinus rhythm at a rate of 79. No ST changes.  A/P  1 ischemic cardiomyopathy-continue ARB and beta blocker.   2 hyperlipidemia-continue statin. Lipids and liver monitored by primary care.  3 hypertension-blood pressure controlled. Continue present medications.  4 coronary artery disease-continue aspirin and statin. Discontinue Plavix.  5 peripheral vascular disease-no recent claudication. Continue aspirin and statin.  6 preoperative evaluation prior to ear surgery-patient is scheduled to have an ear implant. He can climb 2 flights of stairs without having chest pain or dyspnea and can ambulate 2 blocks. Given good functional capacity he may proceed without  further ischemia evaluation. Continue present medications pre-and postoperatively.  Kirk Ruths, MD

## 2016-07-27 NOTE — Patient Instructions (Signed)
Medication Instructions:   STOP PLAVIX  Follow-Up:  Your physician wants you to follow-up in: [place] will receive a reminder letter in the mail two months in advance. If you don't receive a letter, please call our office to schedule the follow-up appointment.   If you need a refill on your cardiac medications before your next appointment, please call your pharmacy.

## 2016-08-31 ENCOUNTER — Ambulatory Visit: Payer: Medicare Other | Admitting: Cardiology

## 2016-09-06 ENCOUNTER — Other Ambulatory Visit: Payer: Self-pay

## 2016-09-06 MED ORDER — CARVEDILOL 12.5 MG PO TABS: 12.5000 mg | ORAL_TABLET | Freq: Two times a day (BID) | ORAL | 3 refills | Status: DC

## 2017-08-20 ENCOUNTER — Other Ambulatory Visit: Payer: Self-pay | Admitting: Cardiology

## 2017-08-21 ENCOUNTER — Other Ambulatory Visit: Payer: Self-pay | Admitting: *Deleted

## 2017-08-21 MED ORDER — CARVEDILOL 12.5 MG PO TABS: 12.5000 mg | ORAL_TABLET | Freq: Two times a day (BID) | ORAL | 0 refills | Status: DC

## 2017-08-21 NOTE — Telephone Encounter (Signed)
REFILL 

## 2017-12-24 ENCOUNTER — Other Ambulatory Visit: Payer: Self-pay | Admitting: Cardiology

## 2018-05-13 ENCOUNTER — Telehealth: Payer: Self-pay | Admitting: Cardiology

## 2018-05-13 NOTE — Telephone Encounter (Signed)
Pt c/o Shortness Of Breath: STAT if SOB developed within the last 24 hours or pt is noticeably SOB on the phone  1. Are you currently SOB (can you hear that pt is SOB on the phone)?yes 2. How long have you been experiencing SOB? Beginning  Of this month and now it is getting worse  3. Are you SOB when sitting or when up moving around?  Mostly when he walks around,sitting is not as bad  4. Are you currently experiencing any other symptoms? Pain in his right leg and right foot is numb

## 2018-05-13 NOTE — Telephone Encounter (Signed)
Spoke with pt son, the patient has been having trouble for about 2 months now with SOB and leg pain. Patient is having trouble with SOB when walking across the room and at times at rest. He denies weight gain or edema. He has been to the ER in [place] and also in [place] for these issues.  His son reports everything has checked out fine and they were told he needed to see the cardiologist. They are getting the records from the [facility] ER and are requesting the records from [place]. They have an appointment on Thursday this week and are fine with that appt. They will bring all records and medications to follow up.

## 2018-05-15 ENCOUNTER — Encounter: Payer: Self-pay | Admitting: Physician Assistant

## 2018-05-15 ENCOUNTER — Ambulatory Visit (INDEPENDENT_AMBULATORY_CARE_PROVIDER_SITE_OTHER): Payer: Medicare Other | Admitting: Physician Assistant

## 2018-05-15 VITALS — BP 116/65 | HR 67 | Ht 68.0 in | Wt 181.2 lb

## 2018-05-15 DIAGNOSIS — E119 Type 2 diabetes mellitus without complications: Secondary | ICD-10-CM

## 2018-05-15 DIAGNOSIS — Z8673 Personal history of transient ischemic attack (TIA), and cerebral infarction without residual deficits: Secondary | ICD-10-CM

## 2018-05-15 DIAGNOSIS — E785 Hyperlipidemia, unspecified: Secondary | ICD-10-CM

## 2018-05-15 DIAGNOSIS — I739 Peripheral vascular disease, unspecified: Secondary | ICD-10-CM

## 2018-05-15 DIAGNOSIS — I10 Essential (primary) hypertension: Secondary | ICD-10-CM | POA: Diagnosis not present

## 2018-05-15 DIAGNOSIS — I25810 Atherosclerosis of coronary artery bypass graft(s) without angina pectoris: Secondary | ICD-10-CM | POA: Diagnosis not present

## 2018-05-15 MED ORDER — CLOPIDOGREL BISULFATE 75 MG PO TABS: ORAL_TABLET | ORAL | 0 refills | Status: DC

## 2018-05-15 MED ORDER — CLOPIDOGREL BISULFATE 75 MG PO TABS: 75.0000 mg | ORAL_TABLET | Freq: Every day | ORAL | 1 refills | Status: DC

## 2018-05-15 MED ORDER — AMLODIPINE BESYLATE 5 MG PO TABS: 5.0000 mg | ORAL_TABLET | Freq: Every day | ORAL | 1 refills | Status: DC

## 2018-05-15 NOTE — Progress Notes (Signed)
Cardiology Office Note    Date:  05/16/2018   ID:  John [name], DOB 24-Sep-1941, MRN [id number]  PCP:  Clearence Ped, MD  Cardiologist:  Dr. [name]   Chief Complaint  Patient presents with  . Follow-up    seen for Dr. [name].     History of Present Illness:  John [name] is a 77 y.o. male with PMH of CAD, DM II, HTN, HLD, PVD and h/o CVA.  Patient had myocardial infarction at age 34 in [place].  Cardiac catheterization was performed, but medical therapy was recommended.  Cardiac catheterization in June 2013 in [place] showed EF 40%, hypokinesis of the anterior wall, 30 to 40% mid RCA lesion, 30 to 40% mid PDA lesion, normal left main, 80% proximal and mid LAD lesion, 90% left circumflex lesion.  He underwent PCI of his LAD with 2 drug-eluting stents, medical therapy was recommended for left circumflex lesion.  Aortogram performed for claudication and abnormal ABI in July 2013 demonstrated no abdominal aortic aneurysm and normal renal arteries, 50% distal common iliac lesion on the left, 50 to 70% SFA lesion, totally occluded left anterior tibial artery.  On the right side, he had 70 to 80% proximal right common iliac lesion, 50 to 60% anterior tubular lesion.  He eventually underwent covered stent to the common iliac on the right.  Continue observation for persistent symptom was recommended for the right SFA lesion.  Medical therapy was recommended for the left side.  Echocardiogram in March 2014 showed EF 45 to 50%, mild LAE.  Patient was last seen by Dr. [name] in October 2017 for preoperative clearance prior to the ear implant.  He was cleared to proceed with surgery without any further work-up.  Plavix was discontinued at that time.  He has not followed up since.  He has been followed by Dr. Raul Del of vascular surgery at [facility].  Recent ABI obtained earlier this year showed significant drop down to 0.6.  Doppler revealed occlusion of both SFA stent.  However on follow-up, patient  does not seems to have any resting pain or life-threatening ischemic symptoms.  It was recommended for him to continue aspirin and statin for medical management unless symptoms recur.   Patient presents today for delayed cardiology office visit.  Based on history supplied by family, patient was recently admitted to [facility] on 03/17/2018 with chest pain while visiting his cousin.  According to the patient, he underwent cardiac catheterization via right radial artery and had a 4 stents placed in the lateral side of the heart.  He subsequently underwent lower extremity angiography from both side of the groin to the contralateral side and underwent further stent placement for the leg as well.  No record is available at this time, I will request outside record.  He was initially placed on aspirin and Brilinta, however due to shortness of breath at rest associated with Brilinta side effect, he was switched to Effient.  He has not had any further chest discomfort since, however become short of breath after walking short distance.  After discussion with Dr. [name], I will switch his Effient to Plavix given contraindication by the Effient and history of stroke.  About 2 weeks after starting on the Effient, he will need to obtain a P2Y12 study to check for platelet inhibition.  Otherwise, I think he is currently deconditioned and will need to start on the cardiac rehab.  He is still complaining of right groin pain radiating down  the left leg.  Some of which may be related to peripheral arterial disease.  He has upcoming follow-up with Dr. Raul Del to assess for the patency of his lower leg stent.  His groin area does not have any obvious sign of bruit, suspicion for pseudoaneurysm relatively low.  Family is aware of the signs and symptoms of critical limb ischemia and understand that if the leg pain suddenly become worse or any discoloration of the limb, he will need to seek more urgent medical  attention.  Patient will need cardiac rehab.  I also gave him 6 months of temporary disability parking placard.  He has trouble walking at this point and this will interfere with his recovery as well.  Otherwise, the only new medication is amlodipine 5 mg daily.  We will request full records from [facility].  He can follow-up with Dr. [name] in 2 to 49-month.    Past Medical History:  Diagnosis Date  . Anxiety   . 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 BPH (benign prostatic hyperplasia)   . CAD (coronary artery disease)   . Cholesteatoma of attic   . Depression   . Diabetes mellitus ([place])   . Ear disease   . GERD (gastroesophageal reflux disease)   . Hyperlipidemia   . Hypertension   . PVD (peripheral vascular disease) ([place])   . Stroke [facility])     Past Surgical History:  Procedure Laterality Date  . BONE ANCHORED HEARING AID IMPLANT Left 06/25/2013   Procedure: BONE ANCHORED HEARING AID (BAHA) IMPLANT;  Surgeon: Fannie Knee, MD;  Location: [place];  Service: ENT;  Laterality: Left;  . CAROTID STENT INSERTION Left   . COCHLEAR IMPLANT    . CORONARY STENT PLACEMENT    . MASS EXCISION Left 06/25/2013   Procedure: EXCISION LEFT TEMPORAL MASS;  Surgeon: Fannie Knee, MD;  Location: [place];  Service: ENT;  Laterality: Left;  . TONSILLECTOMY      Current Medications: Outpatient Medications Prior to Visit  Medication Sig Dispense Refill  . ADVAIR DISKUS 250-50 MCG/DOSE AEPB Inhale 1 puff into the lungs daily. Inhale 1 puff daily    . allopurinol (ZYLOPRIM) 300 MG tablet Take 1 tablet by mouth Daily.    Marland Kitchen ALPRAZolam (XANAX) 0.5 MG tablet Take 0.5 mg by mouth at bedtime as needed for sleep.    Marland Kitchen aspirin 81 MG tablet Take 81 mg by mouth daily. Take 1 tab daily    . carvedilol (COREG) 12.5 MG tablet TAKE 1 TABLET TWICE A DAY WITH MEALS (NEED OFFICE VISIT) 180 tablet 1  . CRESTOR 20 MG tablet Take 1 tablet by mouth Daily.    Marland Kitchen glimepiride (AMARYL) 4 MG tablet  Take 1 tablet by mouth daily. Take 1 tab daily    . INVOKANA 300 MG TABS tablet Take 1 tablet by mouth daily. Take 1 tab daily    . Lactobacillus (ACIDOPHILUS) CAPS capsule Take 1 capsule by mouth daily. Take 1 cap daily    . pantoprazole (PROTONIX) 40 MG tablet Take 40 mg by mouth daily.    Marland Kitchen SPIRIVA HANDIHALER 18 MCG inhalation capsule PRN    . Tamsulosin HCl (FLOMAX) 0.4 MG CAPS Take 1 tablet by mouth Daily.    . prasugrel (EFFIENT) 10 MG TABS tablet Take 1 tablet by mouth every morning.    Marland Kitchen DIOVAN 320 MG tablet Take 1 tablet by mouth Daily.    . metFORMIN (GLUCOPHAGE-XR) 500 MG 24 hr tablet  Take 1,000 mg by mouth BID times 48H.      No facility-administered medications prior to visit.      Allergies:   Ticagrelor; Ezetimibe-simvastatin; and Penicillins   Social History   Socioeconomic History  . Marital status: Widowed    Spouse name: Not on file  . Number of children: 3  . Years of education: Not on file  . Highest education level: Not on file  Occupational History  . Not on file  Social Needs  . Financial resource strain: Not on file  . Food insecurity:    Worry: Not on file    Inability: Not on file  . Transportation needs:    Medical: Not on file    Non-medical: Not on file  Tobacco Use  . Smoking status: Former Smoker    Last attempt to quit: 05/19/1989    Years since quitting: 29.0  . Smokeless tobacco: Never Used  Substance and Sexual Activity  . Alcohol use: Yes    Comment: 2-3 glasses wine per day  . Drug use: No  . Sexual activity: Not on file  Lifestyle  . Physical activity:    Days per week: Not on file    Minutes per session: Not on file  . Stress: Not on file  Relationships  . Social connections:    Talks on phone: Not on file    Gets together: Not on file    Attends religious service: Not on file    Active member of club or organization: Not on file    Attends meetings of clubs or organizations: Not on file    Relationship status: Not on file    Other Topics Concern  . Not on file  Social History Narrative  . Not on file     Family History:  The patient's family history includes Diabetes in his unknown relative.   ROS:   Please see the history of present illness.    ROS All other systems reviewed and are negative.   PHYSICAL EXAM:   VS:  BP 116/65   Pulse 67   Ht 5\' 8"  (1.727 m)   Wt 181 lb 3.2 oz (82.2 kg)   BMI 27.55 kg/m    GEN: Well nourished, well developed, in no acute distress  HEENT: normal  Neck: no JVD, carotid bruits, or masses Cardiac: RRR; no murmurs, rubs, or gallops,no edema  Respiratory:  clear to auscultation bilaterally, normal work of breathing GI: soft, nontender, nondistended, + BS MS: no deformity or atrophy  Skin: warm and dry, no rash Neuro:  Alert and Oriented x 3, Strength and sensation are intact Psych: euthymic mood, full affect  Wt Readings from Last 3 Encounters:  05/15/18 181 lb 3.2 oz (82.2 kg)  07/27/16 188 lb (85.3 kg)  05/13/15 182 lb 6 oz (82.7 kg)      Studies/Labs Reviewed:   EKG:  EKG is ordered today.  The ekg ordered today demonstrates normal sinus rhythm without significant ST-T wave changes.  Recent Labs: No results found for requested labs within last 8760 hours.   Lipid Panel No results found for: CHOL, TRIG, HDL, CHOLHDL, VLDL, LDLCALC, LDLDIRECT  Additional studies/ records that were reviewed today include:   Echo 01/01/2013 LV EF: 45% -  50% Study Conclusions  - Left ventricle: The cavity size was normal. Wall thickness was increased in a pattern of mild LVH. Systolic function was mildly reduced. The estimated ejection fraction was in the range of 45% to 50%. Septal  hypokinesis. Doppler parameters are consistent with abnormal left ventricular relaxation (grade 1 diastolic dysfunction). - Aortic valve: There was no stenosis. - Mitral valve: Mildly calcified annulus. Trivial regurgitation. - Left atrium: The atrium was mildly  dilated. - Right ventricle: The cavity size was normal. Systolic function was normal. - Pulmonary arteries: No complete TR doppler jet so unable to estimate PA systolic pressure. - Inferior vena cava: The vessel was normal in size; the respirophasic diameter changes were in the normal range (= 50%); findings are consistent with normal central venous pressure. Impressions:  - Normal LV size with mild LV hypertrophy. EF 45-50% with septal hypokinesis. Normal RV size and systolic function. No significant valvular abnormalities.   ASSESSMENT:    1. Coronary artery disease involving coronary bypass graft of native heart without angina pectoris   2. Essential hypertension   3. Hyperlipidemia, unspecified hyperlipidemia type   4. Controlled type 2 diabetes mellitus without complication, without long-term current use of insulin ([place])   5. PAD (peripheral artery disease) ([place])   6. H/O: CVA (cerebrovascular accident)      PLAN:  In order of problems listed above:  1. CAD: According to the patient, patient was recently admitted to [facility],   He reportedly underwent cardiac catheterization via right radial artery and had several stents placed on the lateral side of the heart.  We will request full record.  He continued to have some dyspnea on exertion, I recommended to start on cardiac rehab.  He denies any recurrence of chest discomfort since the recent cardiac catheterization.  He was initially placed on aspirin and Brilinta, however due to intolerance of Brilinta, he was switched to Effient.  I discussed with Dr. [name] today, given his prior history of stroke, Effient is contraindicated.  We will switch him to Plavix with instruction of 300 mg Plavix loading dose followed by 75 mg daily.  Will need a P2Y12 study 2 weeks after start on the Plavix.  2. Ischemic cardiomyopathy: Previous EF was 45 to 50% in 2014, will request recent echocardiogram as  well.  3. Hypertension: Blood pressure well controlled  4. Hyperlipidemia: Continue on Crestor 20 mg daily.  5. DM2: Managed by primary care provider.  6. PAD: According to the patient, he also underwent lower extremity angiography from bilateral femoral artery in [place] as well.  Since the recent procedure, he has been having right groin pain.  I did not appreciate any significant bruit to suggest pseudoaneurysm.  He will need close outpatient visit with Dr. Raul Del of vascular surgery to make sure his lower extremity stent has not closed down.  He is aware to seek urgent medical attention if he started having discoloration of the limb, pain out of proportion or loss of sensation to suggest critical limb ischemia.  7. History of CVA: No recurrence.  Effient is contraindicated with prior history of CVA.    Medication Adjustments/Labs and Tests Ordered: Current medicines are reviewed at length with the patient today.  Concerns regarding medicines are outlined above.  Medication changes, Labs and Tests ordered today are listed in the Patient Instructions below. Patient Instructions  Medication Instructions:  START Amlopodine 5mg  Take 1 tablet once a day START Plavix 75mg --START Medication tomorrow take 4 tabs in the Morning one time dose then take 1 tablet once a day DISCONTINUE Effient   Labwork: Your physician recommends that you return for lab work in: COMPLETE P2Y12 LAB AT [place]  Testing/Procedures: None   Follow-Up:  Your physician recommends that you schedule a follow-up appointment in: 2-3 MONTHS WITH DR [name]  Any Other Special Instructions Will Be Listed Below (If Applicable). KEEP FOLLOW UP WITH DR Raul Del If you need a refill on your cardiac medications before your next appointment, please call your pharmacy.     Hilbert Corrigan, [place]  05/16/2018 12:11 PM    [facility] [place], [place], [place]  [address] Phone: [phone]; Fax:  [phone]

## 2018-05-15 NOTE — Patient Instructions (Addendum)
Medication Instructions:  START Amlopodine 5mg  Take 1 tablet once a day START Plavix 75mg --START Medication tomorrow take 4 tabs in the Morning one time dose then take 1 tablet once a day DISCONTINUE Effient   Labwork: Your physician recommends that you return for lab work in: COMPLETE P2Y12 LAB AT [place]  Testing/Procedures: None   Follow-Up: Your physician recommends that you schedule a follow-up appointment in: 2-3 MONTHS WITH DR [name]  Any Other Special Instructions Will Be Listed Below (If Applicable). KEEP FOLLOW UP WITH DR Raul Del If you need a refill on your cardiac medications before your next appointment, please call your pharmacy.

## 2018-05-16 ENCOUNTER — Encounter: Payer: Self-pay | Admitting: Physician Assistant

## 2018-05-16 ENCOUNTER — Other Ambulatory Visit: Payer: Self-pay

## 2018-05-16 DIAGNOSIS — I25810 Atherosclerosis of coronary artery bypass graft(s) without angina pectoris: Secondary | ICD-10-CM

## 2018-05-21 ENCOUNTER — Telehealth (HOSPITAL_COMMUNITY): Payer: Self-pay

## 2018-05-21 NOTE — Telephone Encounter (Signed)
Called patient to see if he was interested in participating in the Cardiac Rehab Program. Patient was not available but was able to talk to patient son Antonio Creswell. Explained scheduling process and went over insurance, Rigel Filsinger verbalized understanding. Patient will come in for orientation on 07/03/18 @ 1:30PM and will attend the 1:15PM exercise class.  Mailed homework package.

## 2018-05-21 NOTE — Telephone Encounter (Signed)
Pt insurance is active and benefits verified through Medicare A/B. Co-pay $0.00, DED $185.00/$185.00 met, out of pocket $0.00/$0.00 met, co-insurance 20%. No pre-authorization. Passport, 05/20/18, JSC#[id number]  Secondary insurance is active and benefits verified through [place]. No co-pay, co-insurance, ofp and ded. Passport, 05/20/18 @ 2:26PM, UWT#[id number]

## 2018-05-23 ENCOUNTER — Telehealth (HOSPITAL_COMMUNITY): Payer: Self-pay

## 2018-05-23 NOTE — Telephone Encounter (Signed)
Son of patient called on behalf of patient and stated he would like to switch from 1:15 to 2:45pm.

## 2018-06-02 ENCOUNTER — Telehealth: Payer: Self-pay | Admitting: Cardiology

## 2018-06-02 NOTE — Telephone Encounter (Signed)
New Message:  Patient daughter call to cancel all remaining appointments for this patient. He will be out of town for the remaining of the year.

## 2018-06-03 NOTE — Telephone Encounter (Signed)
Follow up appointment with dr [name] canceled.

## 2018-06-25 ENCOUNTER — Telehealth (HOSPITAL_COMMUNITY): Payer: Self-pay

## 2018-07-03 ENCOUNTER — Ambulatory Visit (HOSPITAL_COMMUNITY): Payer: Medicare Other

## 2018-07-07 ENCOUNTER — Ambulatory Visit (HOSPITAL_COMMUNITY): Payer: Medicare Other

## 2018-07-09 ENCOUNTER — Ambulatory Visit (HOSPITAL_COMMUNITY): Payer: Medicare Other

## 2018-07-11 ENCOUNTER — Ambulatory Visit (HOSPITAL_COMMUNITY): Payer: Medicare Other

## 2018-07-14 ENCOUNTER — Ambulatory Visit (HOSPITAL_COMMUNITY): Payer: Medicare Other

## 2018-07-16 ENCOUNTER — Ambulatory Visit (HOSPITAL_COMMUNITY): Payer: Medicare Other

## 2018-07-18 ENCOUNTER — Ambulatory Visit (HOSPITAL_COMMUNITY): Payer: Medicare Other

## 2018-07-21 ENCOUNTER — Ambulatory Visit (HOSPITAL_COMMUNITY): Payer: Medicare Other

## 2018-07-23 ENCOUNTER — Ambulatory Visit (HOSPITAL_COMMUNITY): Payer: Medicare Other

## 2018-07-25 ENCOUNTER — Ambulatory Visit (HOSPITAL_COMMUNITY): Payer: Medicare Other

## 2018-07-28 ENCOUNTER — Ambulatory Visit: Payer: Medicare Other | Admitting: Cardiology

## 2018-07-28 ENCOUNTER — Ambulatory Visit (HOSPITAL_COMMUNITY): Payer: Medicare Other

## 2018-07-30 ENCOUNTER — Ambulatory Visit (HOSPITAL_COMMUNITY): Payer: Medicare Other

## 2018-08-01 ENCOUNTER — Ambulatory Visit (HOSPITAL_COMMUNITY): Payer: Medicare Other

## 2018-08-04 ENCOUNTER — Ambulatory Visit (HOSPITAL_COMMUNITY): Payer: Medicare Other

## 2018-08-06 ENCOUNTER — Ambulatory Visit (HOSPITAL_COMMUNITY): Payer: Medicare Other

## 2018-08-08 ENCOUNTER — Ambulatory Visit (HOSPITAL_COMMUNITY): Payer: Medicare Other

## 2018-08-11 ENCOUNTER — Ambulatory Visit (HOSPITAL_COMMUNITY): Payer: Medicare Other

## 2018-08-13 ENCOUNTER — Ambulatory Visit (HOSPITAL_COMMUNITY): Payer: Medicare Other

## 2018-08-15 ENCOUNTER — Ambulatory Visit (HOSPITAL_COMMUNITY): Payer: Medicare Other

## 2018-08-18 ENCOUNTER — Ambulatory Visit (HOSPITAL_COMMUNITY): Payer: Medicare Other

## 2018-08-20 ENCOUNTER — Ambulatory Visit (HOSPITAL_COMMUNITY): Payer: Medicare Other

## 2018-08-22 ENCOUNTER — Ambulatory Visit (HOSPITAL_COMMUNITY): Payer: Medicare Other

## 2018-08-25 ENCOUNTER — Ambulatory Visit (HOSPITAL_COMMUNITY): Payer: Medicare Other

## 2018-08-27 ENCOUNTER — Ambulatory Visit (HOSPITAL_COMMUNITY): Payer: Medicare Other

## 2018-08-29 ENCOUNTER — Ambulatory Visit (HOSPITAL_COMMUNITY): Payer: Medicare Other

## 2018-09-01 ENCOUNTER — Ambulatory Visit (HOSPITAL_COMMUNITY): Payer: Medicare Other

## 2018-09-03 ENCOUNTER — Ambulatory Visit (HOSPITAL_COMMUNITY): Payer: Medicare Other

## 2018-09-05 ENCOUNTER — Ambulatory Visit (HOSPITAL_COMMUNITY): Payer: Medicare Other

## 2018-09-08 ENCOUNTER — Ambulatory Visit (HOSPITAL_COMMUNITY): Payer: Medicare Other

## 2018-09-10 ENCOUNTER — Ambulatory Visit (HOSPITAL_COMMUNITY): Payer: Medicare Other

## 2018-09-12 ENCOUNTER — Ambulatory Visit (HOSPITAL_COMMUNITY): Payer: Medicare Other

## 2018-09-15 ENCOUNTER — Ambulatory Visit (HOSPITAL_COMMUNITY): Payer: Medicare Other

## 2018-09-17 ENCOUNTER — Ambulatory Visit (HOSPITAL_COMMUNITY): Payer: Medicare Other

## 2018-09-19 ENCOUNTER — Ambulatory Visit (HOSPITAL_COMMUNITY): Payer: Medicare Other

## 2018-09-22 ENCOUNTER — Ambulatory Visit (HOSPITAL_COMMUNITY): Payer: Medicare Other

## 2018-09-24 ENCOUNTER — Ambulatory Visit (HOSPITAL_COMMUNITY): Payer: Medicare Other

## 2018-09-26 ENCOUNTER — Ambulatory Visit (HOSPITAL_COMMUNITY): Payer: Medicare Other

## 2018-09-29 ENCOUNTER — Ambulatory Visit (HOSPITAL_COMMUNITY): Payer: Medicare Other

## 2018-10-01 ENCOUNTER — Ambulatory Visit (HOSPITAL_COMMUNITY): Payer: Medicare Other

## 2018-10-03 ENCOUNTER — Ambulatory Visit (HOSPITAL_COMMUNITY): Payer: Medicare Other

## 2018-10-06 ENCOUNTER — Ambulatory Visit (HOSPITAL_COMMUNITY): Payer: Medicare Other

## 2018-10-08 ENCOUNTER — Ambulatory Visit (HOSPITAL_COMMUNITY): Payer: Medicare Other

## 2018-10-10 ENCOUNTER — Ambulatory Visit (HOSPITAL_COMMUNITY): Payer: Medicare Other

## 2018-10-20 ENCOUNTER — Other Ambulatory Visit: Payer: Self-pay | Admitting: Physician Assistant

## 2019-08-31 ENCOUNTER — Other Ambulatory Visit: Payer: Self-pay | Admitting: Physician Assistant

## 2020-02-29 ENCOUNTER — Other Ambulatory Visit: Payer: Self-pay | Admitting: Physician Assistant

## 2020-04-12 ENCOUNTER — Other Ambulatory Visit: Payer: Self-pay | Admitting: Physician Assistant

## 2020-07-05 ENCOUNTER — Encounter: Payer: Self-pay | Admitting: Psychiatry

## 2020-07-05 ENCOUNTER — Other Ambulatory Visit: Payer: Self-pay

## 2020-07-05 ENCOUNTER — Ambulatory Visit (INDEPENDENT_AMBULATORY_CARE_PROVIDER_SITE_OTHER): Payer: Medicare Other | Admitting: Psychiatry

## 2020-07-05 DIAGNOSIS — F332 Major depressive disorder, recurrent severe without psychotic features: Secondary | ICD-10-CM | POA: Diagnosis not present

## 2020-07-05 MED ORDER — SERTRALINE HCL 25 MG PO TABS: ORAL_TABLET | ORAL | 1 refills | Status: DC

## 2020-07-05 NOTE — Progress Notes (Signed)
[facility] MD/PA/NP Initial Note  07/05/2020 3:09 PM John [name]  MRN:  [id number]  Chief Complaint:  Chief Complaint    Depression      HPI: Patient is an 79 year old male being seen for initial evaluation for depression.  Patient reports that he recently was noticing changes in his thinking and "was drinking more than I think I needed to and was doing things I ordinarily wouldn't do."  He describes an event that occurred while he was drinking that he describes as out of character for him.  He reports that after this occurred he has been staying with his daughter and has stopped drinking other than an occasional sip of wine. He denies alcohol cravings. He reports that he was drinking about a half gallon of [place] St[place] a week and some beer a week. Denies any withdrawal s/s.  He reports that he is considering staying with his daughter permanently or moving to assisted living.   He reports that he had some depression prior to 2009-01-16 in response to some stressors involving his oldest daughter. He reports that his depression significantly worsened in 01/16/2009 when his oldest daughter died and his wife was diagnosed with stomach cancer.  His wife died in Jan 17, 2011 and he reports that his depression has continued to progress. He reports persistent depression- "I stay sad a lot." He reports that he frequently misses his wife and thinks about her and places they went together. Denies irritability. He reports that he feels sleepy frequently. He reports that his energy is low. Motivation is also low. He has upcoming sleep study. Denies difficulty falling or staying asleep. Typically sleeping at least 6 hours a night. He reports that his wife used to say he could snore. He reports that he has occasionally woken himself up snoring. He reports that he is dozing off frequently during the daytime. Appetite is stable. He reports that he has gained some weight gradually over a period of time. He reports difficulty with  concentration and focus. He reports that he has to read things several times to be able to fully comprehend what he has read. Denies anhedonia. Denies SI.  He reports, "I've had a lot of anxiety" and reports that watching current events closely has escalated anxiety. Has some worry about what he will need to do when he can no longer take care of himself. He will feel some tightness in his chest with anxiety. Denies any panic attacks. He reports that he has never liked to socialize and feels uncomfortable in big groups and attributes this to hearing loss.   Denies any depressive episodes prior to current depressive episode.  Denies periods of decreased need for sleep, elevated mood, excessive energy, impulsivity, or manic s/s. Denies AH or VH. Denies paranoia.   Born and raised in [place]. Has one sister living that is 38 yo. Had 3 brothers, one that died in infancy, and 2 sisters. He reports that he had a good childhood and [place] Completed 12th grade. 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 Married for 94 years and reports that they had a "great marriage." Has one son and 2 children. Moved to [place] in 01-16-1997. Oldest daughter died in 01/16/2009. Reports that oldest daughter had 2 children and overdosed. Daughter lives in [place] and son lives in [place]. Both children are supportive. He lives in [place]. Has been staying with his daughter about a month. He reports that he enjoys watching TV. Also  enjoys doing work on Cytogeneticist. Enjoys going to see his sister in [place].   Past Psychiatric Medication Trials: Alprazolam Citalopram- Does not recall it helping   Visit Diagnosis:    ICD-10-CM   1. Severe episode of recurrent major depressive disorder, without psychotic features ([place])  F33.2 sertraline (ZOLOFT) 25 MG tablet    Past Psychiatric History: Denies any past psychiatric treatment.   Past Medical History:  Past Medical History:   Diagnosis Date  . Anxiety   . 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 BPH (benign prostatic hyperplasia)   . CAD (coronary artery disease)   . Cholesteatoma of attic   . Depression   . Diabetes mellitus ([place])   . Ear disease   . GERD (gastroesophageal reflux disease)   . Hyperlipidemia   . Hypertension   . PVD (peripheral vascular disease) ([place])   . Stroke [facility])     Past Surgical History:  Procedure Laterality Date  . BONE ANCHORED HEARING AID IMPLANT Left 06/25/2013   Procedure: BONE ANCHORED HEARING AID (BAHA) IMPLANT;  Surgeon: Fannie Knee, MD;  Location: [place];  Service: ENT;  Laterality: Left;  . CAROTID STENT INSERTION Left   . COCHLEAR IMPLANT    . CORONARY STENT PLACEMENT    . MASS EXCISION Left 06/25/2013   Procedure: EXCISION LEFT TEMPORAL MASS;  Surgeon: Fannie Knee, MD;  Location: [place];  Service: ENT;  Laterality: Left;  . TONSILLECTOMY      Family History:  Family History  Problem Relation Age of Onset  . Diabetes Other   . Diabetes Mother   . Diabetes Maternal Aunt   . Depression Other   . Depression Other   . Drug abuse Daughter   . Diabetes Daughter     Social History:  Social History   Socioeconomic History  . Marital status: Widowed    Spouse name: Not on file  . Number of children: 3  . Years of education: Not on file  . Highest education level: Not on file  Occupational History  . Not on file  Tobacco Use  . Smoking status: Former Smoker    Quit date: 05/19/1989    Years since quitting: 31.1  . Smokeless tobacco: Never Used  Substance and Sexual Activity  . Alcohol use: Not Currently  . Drug use: No  . Sexual activity: Not on file  Other Topics Concern  . Not on file  Social History Narrative  . Not on file   Social Determinants of Health   Financial Resource Strain:   . Difficulty of Paying Living Expenses: Not on file  Food Insecurity:   . Worried About Charity fundraiser in the Last Year: Not on file  .  Ran Out of Food in the Last Year: Not on file  Transportation Needs:   . Lack of Transportation (Medical): Not on file  . Lack of Transportation (Non-Medical): Not on file  Physical Activity:   . Days of Exercise per Week: Not on file  . Minutes of Exercise per Session: Not on file  Stress:   . Feeling of Stress : Not on file  Social Connections:   . Frequency of Communication with Friends and Family: Not on file  . Frequency of Social Gatherings with Friends and Family: Not on file  . Attends Religious Services: Not on file  . Active Member of Clubs or Organizations: Not on file  . Attends Archivist Meetings: Not on file  .  Marital Status: Not on file    Allergies:  Allergies  Allergen Reactions  . Ticagrelor Shortness Of Breath  . Ezetimibe-Simvastatin     Myalgia Other reaction(s): Other, Other (See Comments) Myalgia Myalgia Myalgia   . Penicillins     Metabolic Disorder Labs: No results found for: HGBA1C, MPG No results found for: PROLACTIN No results found for: CHOL, TRIG, HDL, CHOLHDL, VLDL, LDLCALC No results found for: TSH  Therapeutic Level Labs: No results found for: LITHIUM No results found for: VALPROATE No components found for:  CBMZ  Current Medications: Current Outpatient Medications  Medication Sig Dispense Refill  . allopurinol (ZYLOPRIM) 300 MG tablet Take 1 tablet by mouth Daily.    Marland Kitchen amLODipine (NORVASC) 5 MG tablet Take 1 tablet (5 mg total) by mouth daily. NEED OV. 15 tablet 0  . aspirin 81 MG tablet Take 81 mg by mouth daily. Take 1 tab daily    . canagliflozin (INVOKANA) 100 MG TABS tablet Take 1 tablet by mouth daily.    . candesartan (ATACAND) 32 MG tablet Take 1 tablet by mouth daily.    . carvedilol (COREG) 12.5 MG tablet TAKE 1 TABLET TWICE A DAY WITH MEALS (NEED OFFICE VISIT) 180 tablet 1  . CRESTOR 20 MG tablet Take 1 tablet by mouth Daily.    . Cyanocobalamin (VITAMIN B 12 PO) Take by mouth.    . gabapentin (NEURONTIN)  300 MG capsule Take 1 capsule by mouth 3 (three) times daily.    Marland Kitchen glimepiride (AMARYL) 4 MG tablet Take 1 tablet by mouth daily. Take 1 tab daily    . insulin glargine (LANTUS SOLOSTAR) 100 UNIT/ML Solostar Pen INJECT 24 UNITS EVERY DAY, EXCEPT INJECT 28 UNITS ON SATURDAY AND SUNDAY    . INVOKANA 300 MG TABS tablet Take 1 tablet by mouth daily. Take 1 tab daily    . naproxen sodium (ALEVE) 220 MG tablet Take by mouth.    . pantoprazole (PROTONIX) 40 MG tablet Take by mouth.    . prasugrel (EFFIENT) 10 MG TABS tablet Take 10 mg by mouth daily.    Marland Kitchen PROAIR RESPICLICK 254 (90 Base) MCG/ACT AEPB Inhale into the lungs.    . rosuvastatin (CRESTOR) 20 MG tablet Take 1 tablet by mouth daily.    Marland Kitchen senna (SENOKOT) 8.6 MG tablet Take by mouth.    . tamsulosin (FLOMAX) 0.4 MG CAPS capsule Take 1 capsule by mouth daily.    . clopidogrel (PLAVIX) 75 MG tablet Take 1 tablet (75 mg total) by mouth daily. (Patient not taking: Reported on 07/05/2020) 90 tablet 1  . sertraline (ZOLOFT) 25 MG tablet Take 1/2 tablet for 4 days, then increase to 1 tablet daily 30 tablet 1   No current facility-administered medications for this visit.    Medication Side Effects: N/A  Orders placed this visit:  No orders of the defined types were placed in this encounter.   Psychiatric Specialty Exam:  Review of Systems  Constitutional: Positive for fatigue.  HENT: Positive for congestion and trouble swallowing.   Eyes: Negative.   Respiratory: Positive for cough, shortness of breath and wheezing.   Cardiovascular: Negative.   Gastrointestinal: Negative.   Endocrine: Positive for polydipsia.  Genitourinary: Positive for frequency.  Musculoskeletal: Positive for arthralgias, back pain and gait problem.  Skin: Negative.   Allergic/Immunologic: Negative.   Neurological: Positive for weakness.  Hematological: Negative.   Psychiatric/Behavioral:       Please refer to HPI  All other systems reviewed and are  negative.    There were no vitals taken for this visit.There is no height or weight on file to calculate BMI.  General Appearance: Casual  Eye Contact:  Good  Speech:  Clear and Coherent and Normal Rate  Volume:  Normal  Mood:  Anxious and Depressed  Affect:  Appropriate, Congruent, Depressed and Full Range  Thought Process:  Coherent, Goal Directed, Linear and Descriptions of Associations: Intact  Orientation:  Full (Time, Place, and Person)  Thought Content: Logical and Hallucinations: None   Suicidal Thoughts:  No  Homicidal Thoughts:  No  Memory:  WNL  Judgement:  Good  Insight:  Good  Psychomotor Activity:  Decreased  Concentration:  Concentration: Fair and Attention Span: Fair  Recall:  Good  Fund of Knowledge: Good  Language: Good  Assets:  Communication Skills Desire for Improvement Resilience Social Support  ADL's:  Intact  Cognition: WNL  Prognosis:  Good    Receiving Psychotherapy: No   Treatment Plan/Recommendations: Patient seen for 60 minutes and time spent counseling patient regarding depression and grief.  Encouraged patient to consider talking with a therapist to process grief since unresolved grief and contribute to depressive signs and symptoms.  Patient reports that he will consider scheduling appointment with a therapist.  Agreed with plan for sleep study since patient describes signs and symptoms consistent with sleep apnea.  Discussed that if patient has sleep apnea, that his sleep quality, energy, and concentration would likely improve with treatment of sleep apnea.  Discussed that depressive signs and symptoms would also likely improve with adequate treatment of depression.  Discussed that recommended treatment is with an SSRI.  Recommended sertraline due to sertraline having one of the lowest potential risk of QT prolongation considering his cardiac history.  Reviewed potential benefits, risks, and side effects of sertraline.  Discussed starting with low dose and that  further titration of sertraline may be needed.  Will start sertraline 25 mg 1/2 tablet daily for 4 days, then increase to 25 mg daily for depression.  Patient follow-up with this provider in 4 weeks or sooner if clinically indicated. Patient advised to contact office with any questions, adverse effects, or acute worsening in signs and symptoms.   Thayer Headings, PMHNP

## 2020-08-02 ENCOUNTER — Ambulatory Visit (INDEPENDENT_AMBULATORY_CARE_PROVIDER_SITE_OTHER): Payer: Medicare Other | Admitting: Psychiatry

## 2020-08-02 ENCOUNTER — Other Ambulatory Visit: Payer: Self-pay

## 2020-08-02 ENCOUNTER — Encounter: Payer: Self-pay | Admitting: Psychiatry

## 2020-08-02 DIAGNOSIS — F330 Major depressive disorder, recurrent, mild: Secondary | ICD-10-CM | POA: Diagnosis not present

## 2020-08-02 MED ORDER — SERTRALINE HCL 25 MG PO TABS: 25.0000 mg | ORAL_TABLET | Freq: Every day | ORAL | 1 refills | Status: DC

## 2020-08-02 NOTE — Progress Notes (Signed)
John [name] [id number] 1941-01-21 79 y.o.  Subjective:   Patient ID:  John [name] is a 79 y.o. (DOB [birth year]) male.  Chief Complaint:  Chief Complaint  Patient presents with  . Follow-up    Depression, anxiety    HPI John [name] presents to the office today for follow-up of depression and anxiety. He reports, "I feel a little more at ease... don't have a lot of negative thoughts like I was having." He reports occasional sadness and that this has lessened. He reports that he has sadness with family get togethers when he misses his wife being there. He denies persistent sadness and reports that sadness is no longer persistent. He reports that he has not felt anxious recently. He reports that his daughter commented that he seems to be thinking more clearly and he has noticed improved concentration as well. He reports that he is sleepy throughout the day. He reports that he will frequently toss and turn and awakens throughout the night. He reports that he has been falling asleep faster. Appetite has been "to good." He reports, "I'm motivated but there are some things holding me back." He reports that his son is wanting to help him get more physically active. He reports that he has been more interested in interacting with family. Denies SI.   He reports that he will occasionally have 1-2 glasses of wine. Denies ETOH cravings.   He reports that he is continuing to stay with his daughter and plans to stay there for awhile and possibly consider ALF.  Reports that son-in-law is creating an area in their home with 2 rooms and BR. Plans to sell his home. He reports that he and his son have made amends and are on good terms.  PHQ2-9     Office Visit from 08/02/2020 in [facility]  PHQ-2 Total Score 1      Past Psychiatric Medication Trials: Alprazolam Citalopram- Does not recall it helping  Review of Systems:  Review of Systems  Musculoskeletal: Positive for arthralgias and  gait problem.  Neurological: Positive for weakness.  Hematological:       He reports possible circulation difficulties.  Psychiatric/Behavioral:       Please refer to HPI    Upcoming sleep study in November. Did not tolerate anesthesia recently.   Medications: I have reviewed the patient's current medications.  Current Outpatient Medications  Medication Sig Dispense Refill  . sertraline (ZOLOFT) 25 MG tablet Take 1 tablet (25 mg total) by mouth daily. 90 tablet 1  . allopurinol (ZYLOPRIM) 300 MG tablet Take 1 tablet by mouth Daily.    Marland Kitchen amLODipine (NORVASC) 5 MG tablet Take 1 tablet (5 mg total) by mouth daily. NEED OV. 15 tablet 0  . aspirin 81 MG tablet Take 81 mg by mouth daily. Take 1 tab daily    . canagliflozin (INVOKANA) 100 MG TABS tablet Take 1 tablet by mouth daily.    . candesartan (ATACAND) 32 MG tablet Take 1 tablet by mouth daily.    . carvedilol (COREG) 12.5 MG tablet TAKE 1 TABLET TWICE A DAY WITH MEALS (NEED OFFICE VISIT) 180 tablet 1  . clopidogrel (PLAVIX) 75 MG tablet Take 1 tablet (75 mg total) by mouth daily. (Patient not taking: Reported on 07/05/2020) 90 tablet 1  . CRESTOR 20 MG tablet Take 1 tablet by mouth Daily.    . Cyanocobalamin (VITAMIN B 12 PO) Take by mouth.    . gabapentin (NEURONTIN) 300 MG capsule Take 1 capsule by  mouth 3 (three) times daily.    Marland Kitchen glimepiride (AMARYL) 4 MG tablet Take 1 tablet by mouth daily. Take 1 tab daily    . insulin glargine (LANTUS SOLOSTAR) 100 UNIT/ML Solostar Pen INJECT 24 UNITS EVERY DAY, EXCEPT INJECT 28 UNITS ON SATURDAY AND SUNDAY    . INVOKANA 300 MG TABS tablet Take 1 tablet by mouth daily. Take 1 tab daily    . naproxen sodium (ALEVE) 220 MG tablet Take by mouth.    . pantoprazole (PROTONIX) 40 MG tablet Take by mouth.    . prasugrel (EFFIENT) 10 MG TABS tablet Take 10 mg by mouth daily.    Marland Kitchen PROAIR RESPICLICK 885 (90 Base) MCG/ACT AEPB Inhale into the lungs.    . rosuvastatin (CRESTOR) 20 MG tablet Take 1 tablet  by mouth daily.    Marland Kitchen senna (SENOKOT) 8.6 MG tablet Take by mouth.    . tamsulosin (FLOMAX) 0.4 MG CAPS capsule Take 1 capsule by mouth daily.     No current facility-administered medications for this visit.    Medication Side Effects: None  Allergies:  Allergies  Allergen Reactions  . Ticagrelor Shortness Of Breath  . Ezetimibe-Simvastatin     Myalgia Other reaction(s): Other, Other (See Comments) Myalgia Myalgia Myalgia   . Penicillins     Past Medical History:  Diagnosis Date  . Anxiety   . 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 BPH (benign prostatic hyperplasia)   . CAD (coronary artery disease)   . Cholesteatoma of attic   . Depression   . Diabetes mellitus ([place])   . Ear disease   . GERD (gastroesophageal reflux disease)   . Hyperlipidemia   . Hypertension   . PVD (peripheral vascular disease) ([place])   . Stroke [facility])     Family History  Problem Relation Age of Onset  . Diabetes Other   . Diabetes Mother   . Diabetes Maternal Aunt   . Depression Other   . Depression Other   . Drug abuse Daughter   . Diabetes Daughter     Social History   Socioeconomic History  . Marital status: Widowed    Spouse name: Not on file  . Number of children: 3  . Years of education: Not on file  . Highest education level: Not on file  Occupational History  . Not on file  Tobacco Use  . Smoking status: Former Smoker    Quit date: 05/19/1989    Years since quitting: 31.2  . Smokeless tobacco: Never Used  Substance and Sexual Activity  . Alcohol use: Not Currently  . Drug use: No  . Sexual activity: Not on file  Other Topics Concern  . Not on file  Social History Narrative  . Not on file   Social Determinants of Health   Financial Resource Strain:   . Difficulty of Paying Living Expenses: Not on file  Food Insecurity:   . Worried About Charity fundraiser in the Last Year: Not on file  . Ran Out of Food in the Last Year: Not on file  Transportation Needs:   . Lack of  Transportation (Medical): Not on file  . Lack of Transportation (Non-Medical): Not on file  Physical Activity:   . Days of Exercise per Week: Not on file  . Minutes of Exercise per Session: Not on file  Stress:   . Feeling of Stress : Not on file  Social Connections:   . Frequency of Communication with Friends and Family: Not on file  .  Frequency of Social Gatherings with Friends and Family: Not on file  . Attends Religious Services: Not on file  . Active Member of Clubs or Organizations: Not on file  . Attends Archivist Meetings: Not on file  . Marital Status: Not on file  Intimate Partner Violence:   . Fear of Current or Ex-Partner: Not on file  . Emotionally Abused: Not on file  . Physically Abused: Not on file  . Sexually Abused: Not on file    Past Medical History, Surgical history, Social history, and Family history were reviewed and updated as appropriate.   Please see review of systems for further details on the patient's review from today.   Objective:   Physical Exam:  There were no vitals taken for this visit.  Physical Exam Constitutional:      General: He is not in acute distress. Musculoskeletal:        General: No deformity.  Neurological:     Mental Status: He is alert and oriented to person, place, and time.     Coordination: Coordination normal.  Psychiatric:        Attention and Perception: Attention and perception normal. He does not perceive auditory or visual hallucinations.        Mood and Affect: Mood is not anxious. Affect is not labile, blunt, angry or inappropriate.        Speech: Speech normal.        Behavior: Behavior normal.        Thought Content: Thought content normal. Thought content is not paranoid or delusional. Thought content does not include homicidal or suicidal ideation. Thought content does not include homicidal or suicidal plan.        Cognition and Memory: Cognition and memory normal.        Judgment: Judgment  normal.     Comments: Insight intact Mood presents as less depressed compared to last exam.     Lab Review:     Component Value Date/Time   NA 138 06/25/2013 0936   K 4.1 06/25/2013 0936   CL 104 06/25/2013 0936   GLUCOSE 157 (H) 06/25/2013 0936   BUN 31 (H) 06/25/2013 0936   CREATININE 1.60 (H) 06/25/2013 0936       Component Value Date/Time   HGB 14.3 06/25/2013 0936   HCT 42.0 06/25/2013 0936    No results found for: POCLITH, LITHIUM   No results found for: PHENYTOIN, PHENOBARB, VALPROATE, CBMZ   .res Assessment: Plan:   Discussed treatment options with patient, to include either continuing current dose of sertraline or increasing sertraline.  Patient reports that he would like to continue current dose of sertraline since he has noticed improved mood and anxiety since starting sertraline.  Discussed that mood would likely continue to improve once patient is able to process grief with therapy.  Discussed that energy and sleep quality may improve with evaluation and possible treatment of sleep apnea. Will continue sertraline 25 mg daily for depression and anxiety. Patient to follow-up with this provider in 3 months or sooner if clinically indicated. Patient advised to contact office with any questions, adverse effects, or acute worsening in signs and symptoms.  [place] was seen today for follow-up.  Diagnoses and all orders for this visit:  Mild episode of recurrent major depressive disorder (HCC) -     sertraline (ZOLOFT) 25 MG tablet; Take 1 tablet (25 mg total) by mouth daily.     Please see After Visit Summary for patient specific  instructions.  Future Appointments  Date Time Provider [place]  09/06/2020  3:00 PM Blanchie Serve, PhD [facility] None  11/02/2020  1:30 PM Thayer Headings, PMHNP [facility] None    No orders of the defined types were placed in this encounter.   -------------------------------

## 2020-09-06 ENCOUNTER — Ambulatory Visit: Payer: Medicare Other | Admitting: Psychiatry

## 2020-09-30 ENCOUNTER — Ambulatory Visit (INDEPENDENT_AMBULATORY_CARE_PROVIDER_SITE_OTHER): Payer: Medicare Other | Admitting: Psychiatry

## 2020-09-30 ENCOUNTER — Other Ambulatory Visit: Payer: Self-pay

## 2020-09-30 DIAGNOSIS — F4329 Adjustment disorder with other symptoms: Secondary | ICD-10-CM

## 2020-09-30 DIAGNOSIS — Z8659 Personal history of other mental and behavioral disorders: Secondary | ICD-10-CM

## 2020-09-30 DIAGNOSIS — Z7289 Other problems related to lifestyle: Secondary | ICD-10-CM | POA: Diagnosis not present

## 2020-09-30 DIAGNOSIS — Z789 Other specified health status: Secondary | ICD-10-CM

## 2020-09-30 DIAGNOSIS — G4733 Obstructive sleep apnea (adult) (pediatric): Secondary | ICD-10-CM

## 2020-09-30 DIAGNOSIS — F4381 Prolonged grief disorder: Secondary | ICD-10-CM

## 2020-09-30 DIAGNOSIS — Z9189 Other specified personal risk factors, not elsewhere classified: Secondary | ICD-10-CM

## 2020-09-30 NOTE — Progress Notes (Signed)
PROBLEM-FOCUSED INITIAL PSYCHOTHERAPY EVALUATION Luan Moore, PhD LP [facility]  Name: John [name] Date: 09/30/2020 Time spent: 50 min MRN: [id number] DOB: 1941/07/13 Guardian/Payee: self  PCP: [name], DO Documentation requested on this visit: No  PROBLEM HISTORY Reason for Visit /Presenting Problem:  Chief Complaint  Patient presents with  . Establish Care  . Depression    Narrative/History of Present Illness Referred by Thayer Headings, PMHNP for treatment of prolonged bereavement.  PT reports lost wife Fraser Din in 2012, the love of his life.  Realized how important she had been taking care of things.  Going by places that remind him of her makes him powerfully sad still.    Has lived by himself until a couple months ago -- moved in with daughter and son-in-law, which helps.  Rx helping (just 25mg  Zoloft).  Feeling more at ease.  Sleep study discovered severe sleep apnea, CPAP machine on order.  Was drinking Shearon Stalls) to cope.  Vascular surgery right leg a couple years ago, spared an amputation.  Surgery done by his nephew by marriage.  Fraser Din herself died of stomach cancer.  67 oldest daughter had died in her sleep, suspected OD on pain medication while depressed, divorcing, and in chronic pain.  Made peace with that OK while Fraser Din was alive, but then she was diagnosed stomach cancer 2011, after several years of trying to treat digestive problems.  Went through chemotherapy, several rounds, with a couple of declarations of remission, eventually succumbed.  Had heart attack at 40, thought he was going to go first, but obviously surprised to outlive her.    Bothers him that he's using the money he saved for wife, thinking it should have been for her.  Other regret 6 years after Pat's death, got involved with the neighbor woman, also alone.  (handholding, hugs, not sexual).  She passed away on 01-28-2023.    Recalls how Fraser Din used to point a finger if she got perturbed  with someone.  Helpful between him and the kids to say when she's "still" pointing that finger.  Also a playful family toast -- "Bottoms up", "Up yours", that started earlier.  Prior Psychiatric Assessment/Treatment:   Outpatient treatment: med mgmt Thayer Headings Psychiatric hospitalization: none stated Psychological assessment/testing: none stated   Abuse/neglect screening: Victim of abuse: Not assessed at this time / none suspected.   Victim of neglect: Not assessed at this time / none suspected.   Perpetrator of abuse/neglect: Not assessed at this time / none suspected.   Witness / Exposure to Domestic Violence: Not assessed at this time / none suspected.   Witness to Community Violence:  Not assessed at this time / none suspected.   Protective Services Involvement: No.   Report needed: No.    Substance abuse screening: Current substance abuse: Not assessed at this time / none suspected.   History of impactful substance use/abuse: Not assessed at this time / none suspected.     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  Noted many illnesses including occupational exposure, cardiovascular  conditions, and implanted hearing aid. Past Medical History:  Diagnosis Date  . Anxiety   . 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 BPH (benign prostatic hyperplasia)   . CAD (coronary artery disease)   . Cholesteatoma of attic   . Depression   . Diabetes mellitus ([place])   .  Ear disease   . GERD (gastroesophageal reflux disease)   . Hyperlipidemia   . Hypertension   . PVD (peripheral vascular disease) ([place])   . Stroke [facility])      Past Surgical History:  Procedure Laterality Date  . BONE ANCHORED HEARING AID IMPLANT Left 06/25/2013   Procedure: BONE ANCHORED HEARING AID (BAHA) IMPLANT;  Surgeon: Fannie Knee, MD;  Location: [place];  Service: ENT;  Laterality: Left;  . CAROTID STENT INSERTION Left   . COCHLEAR IMPLANT    . CORONARY STENT PLACEMENT    . MASS EXCISION Left 06/25/2013   Procedure: EXCISION LEFT TEMPORAL MASS;  Surgeon: Fannie Knee, MD;  Location: [place];  Service: ENT;  Laterality: Left;  . TONSILLECTOMY      Allergies  Allergen Reactions  . Penicillins Hives  . Ticagrelor Shortness Of Breath  . Ezetimibe-Simvastatin Other (See Comments)    Myalgia     Medications (as listed in Epic): Current Outpatient Medications  Medication Sig Dispense Refill  . albuterol (VENTOLIN HFA) 108 (90 Base) MCG/ACT inhaler Inhale 2 puffs into the lungs every 6 (six) hours as needed for wheezing or shortness of breath. 8 g 2  . allopurinol (ZYLOPRIM) 300 MG tablet Take 300 mg by mouth daily.    Marland Kitchen amLODipine (NORVASC) 5 MG tablet Take 1 tablet (5 mg total) by mouth daily. NEED OV. 15 tablet 0  . aspirin 81 MG tablet Take 81 mg by mouth daily. Take 1 tab daily    . canagliflozin (INVOKANA) 100 MG TABS tablet Take 100 mg by mouth daily.    . candesartan (ATACAND) 32 MG tablet Take 32 mg by mouth daily.    . carvedilol (COREG) 12.5 MG tablet Take 1 tablet (12.5 mg total) by mouth 2 (two) times daily with a meal. 60 tablet 2  . clopidogrel (PLAVIX) 75 MG tablet Take 1 tablet (75 mg total) by mouth daily. 90 tablet 3  . Cyanocobalamin (VITAMIN B 12 PO) Take 1 tablet by mouth daily.    . furosemide (LASIX) 20 MG tablet Take 1 tablet (20 mg total) by mouth daily. 30 tablet 2  . gabapentin (NEURONTIN) 300 MG capsule Take 300 mg  by mouth 3 (three) times daily.    Marland Kitchen glimepiride (AMARYL) 4 MG tablet Take 4 mg by mouth daily.    . insulin glargine (LANTUS SOLOSTAR) 100 UNIT/ML Solostar Pen Inject 32 Units into the skin 2 (two) times daily.    . pantoprazole (PROTONIX) 40 MG tablet Take 40 mg by mouth daily.    . prasugrel (EFFIENT) 10 MG TABS tablet Take 10 mg by mouth daily.    Marland Kitchen PROAIR RESPICLICK 941 (90 Base) MCG/ACT AEPB Inhale 1-2 puffs into the lungs as needed (shortness of breath).    . rosuvastatin (CRESTOR) 20 MG tablet Take 1 tablet by mouth every evening.    . senna (SENOKOT) 8.6 MG tablet Take 1 tablet by mouth as needed for constipation.    . sertraline (ZOLOFT) 25 MG tablet Take 1 tablet (25 mg total) by mouth every evening. 90 tablet 0  . tamsulosin (FLOMAX) 0.4 MG CAPS capsule Take 0.4 mg by mouth  every evening.     No current facility-administered medications for this visit.    MENTAL STATUS AND OBSERVATIONS Appearance:   Casual     Behavior:  Appropriate  Motor:  Normal  Speech/Language:   Clear and Coherent  Affect:  Appropriate  Mood:  subdued, affable  Thought process:  normal  Thought content:    WNL  Sensory/Perceptual disturbances:    WNL  Orientation:  Fully oriented  Attention:  Good  Concentration:  Good  Memory:  WNL  Fund of knowledge:   Fair  Insight:    Fair  Judgment:   Good  Impulse Control:  Good   Initial Risk Assessment: Danger to self: No Self-injurious behavior: No Danger to others: No Physical aggression / violence: No Duty to warn: No Access to firearms a concern: No Gang involvement: No Patient / guardian was educated about steps to take if suicide or homicide risk level increases between visits: yes . While future psychiatric events cannot be accurately predicted, the patient does not currently require acute inpatient psychiatric care and does not currently meet [facility] involuntary commitment criteria.   DIAGNOSIS:    ICD-10-CM   1. Grief reaction with  prolonged bereavement  F43.29   2. History of depression  Z86.59   3. 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 Severe obstructive sleep apnea  G47.33   5. At risk for polypharmacy  Z91.89     INITIAL TREATMENT: . Support/validation provided for distressing symptoms and confirmed rapport . Ethical orientation and informed consent confirmed re: o privacy rights -- including but not limited to HIPAA, EMR and use of e-PHI o patient responsibilities -- scheduling, fair notice of changes, in-person vs. telehealth and regulatory and financial conditions affecting choice o expectations for working relationship in psychotherapy o needs and consents for working partnerships and exchange of information with other health care providers, especially any medication and other behavioral health providers . Initial orientation to cognitive-behavioral and solution-focused therapy approach . Initial therapy: o Normalized missing Pat, grieving, and trying to come to terms with money meant for her not going to her and having tried and ost another relationship o Explored understandings of afterlife -- believes Fraser Din is in heaven, essentially, able to imagine her approval at him trying to figure out his life without her, no blame for money or relationship -- she needs neither one to be OK, just wants him to be o Affirmed wisdom of moving in with kids for [employer], activity, usefulness and care  . Outlook for therapy -- scheduling constraints, availability of crisis service, inclusion of family member(s) as appropriate  Plan: . Continue to develop lifestyle in shared household . Stay open with adult children about feelings, needs, boundaries . Options to write about Fraser Din, write to her, imagine her present and talk with her, listen for what she does/would say as someone who has no needs of her own here . Notify if need coaching in use and care of CPAP to come.  Encouraged in faithful use once it does. . Maintain medication as  prescribed and work faithfully with relevant prescriber(s) if any changes are desired or seem indicated . Call the clinic on-call service, present to ER, or call 911 if any life-threatening psychiatric crisis Return in about 1 month (around 10/31/2020).  Blanchie Serve, PhD  Luan Moore, PhD LP Clinical Psychologist, [facility] [address], [place], [place] [address] [phone]

## 2020-11-02 ENCOUNTER — Ambulatory Visit: Payer: Medicare Other | Admitting: Psychiatry

## 2020-11-03 ENCOUNTER — Ambulatory Visit: Payer: Medicare Other | Admitting: Psychiatry

## 2020-12-06 ENCOUNTER — Other Ambulatory Visit (HOSPITAL_COMMUNITY): Payer: Self-pay | Admitting: Internal Medicine

## 2020-12-06 ENCOUNTER — Other Ambulatory Visit: Payer: Self-pay | Admitting: Internal Medicine

## 2020-12-06 DIAGNOSIS — N200 Calculus of kidney: Secondary | ICD-10-CM

## 2020-12-09 ENCOUNTER — Ambulatory Visit (HOSPITAL_COMMUNITY): Admission: RE | Admit: 2020-12-09 | Payer: Medicare Other | Source: Ambulatory Visit

## 2020-12-09 ENCOUNTER — Encounter (HOSPITAL_COMMUNITY): Payer: Self-pay

## 2020-12-19 ENCOUNTER — Observation Stay (HOSPITAL_COMMUNITY)
Admission: EM | Admit: 2020-12-19 | Discharge: 2020-12-20 | Disposition: A | Discharge status: Home / Self Care | Payer: Medicare Other | Attending: Internal Medicine | Admitting: Internal Medicine

## 2020-12-19 ENCOUNTER — Emergency Department (HOSPITAL_COMMUNITY): Payer: Medicare Other

## 2020-12-19 ENCOUNTER — Other Ambulatory Visit: Payer: Self-pay

## 2020-12-19 ENCOUNTER — Encounter (HOSPITAL_COMMUNITY): Payer: Self-pay | Admitting: Emergency Medicine

## 2020-12-19 DIAGNOSIS — R0600 Dyspnea, unspecified: Secondary | ICD-10-CM | POA: Diagnosis not present

## 2020-12-19 DIAGNOSIS — J449 Chronic obstructive pulmonary disease, unspecified: Secondary | ICD-10-CM | POA: Diagnosis not present

## 2020-12-19 DIAGNOSIS — E1165 Type 2 diabetes mellitus with hyperglycemia: Secondary | ICD-10-CM | POA: Diagnosis not present

## 2020-12-19 DIAGNOSIS — I739 Peripheral vascular disease, unspecified: Secondary | ICD-10-CM | POA: Insufficient documentation

## 2020-12-19 DIAGNOSIS — I5023 Acute on chronic systolic (congestive) heart failure: Secondary | ICD-10-CM | POA: Diagnosis not present

## 2020-12-19 DIAGNOSIS — I2510 Atherosclerotic heart disease of native coronary artery without angina pectoris: Secondary | ICD-10-CM | POA: Diagnosis not present

## 2020-12-19 DIAGNOSIS — Z794 Long term (current) use of insulin: Secondary | ICD-10-CM | POA: Diagnosis not present

## 2020-12-19 DIAGNOSIS — G4733 Obstructive sleep apnea (adult) (pediatric): Secondary | ICD-10-CM | POA: Diagnosis not present

## 2020-12-19 DIAGNOSIS — I5031 Acute diastolic (congestive) heart failure: Secondary | ICD-10-CM | POA: Diagnosis not present

## 2020-12-19 DIAGNOSIS — I130 Hypertensive heart and chronic kidney disease with heart failure and stage 1 through stage 4 chronic kidney disease, or unspecified chronic kidney disease: Secondary | ICD-10-CM | POA: Insufficient documentation

## 2020-12-19 DIAGNOSIS — R778 Other specified abnormalities of plasma proteins: Secondary | ICD-10-CM

## 2020-12-19 DIAGNOSIS — I5022 Chronic systolic (congestive) heart failure: Secondary | ICD-10-CM | POA: Insufficient documentation

## 2020-12-19 DIAGNOSIS — Z7982 Long term (current) use of aspirin: Secondary | ICD-10-CM | POA: Diagnosis not present

## 2020-12-19 DIAGNOSIS — Z8679 Personal history of other diseases of the circulatory system: Secondary | ICD-10-CM | POA: Insufficient documentation

## 2020-12-19 DIAGNOSIS — R0602 Shortness of breath: Principal | ICD-10-CM | POA: Insufficient documentation

## 2020-12-19 DIAGNOSIS — Z87891 Personal history of nicotine dependence: Secondary | ICD-10-CM | POA: Insufficient documentation

## 2020-12-19 DIAGNOSIS — Z20822 Contact with and (suspected) exposure to covid-19: Secondary | ICD-10-CM | POA: Insufficient documentation

## 2020-12-19 DIAGNOSIS — I509 Heart failure, unspecified: Secondary | ICD-10-CM

## 2020-12-19 DIAGNOSIS — Z79899 Other long term (current) drug therapy: Secondary | ICD-10-CM | POA: Diagnosis not present

## 2020-12-19 DIAGNOSIS — N182 Chronic kidney disease, stage 2 (mild): Secondary | ICD-10-CM | POA: Insufficient documentation

## 2020-12-19 DIAGNOSIS — Z955 Presence of coronary angioplasty implant and graft: Secondary | ICD-10-CM | POA: Insufficient documentation

## 2020-12-19 DIAGNOSIS — R079 Chest pain, unspecified: Secondary | ICD-10-CM | POA: Diagnosis present

## 2020-12-19 LAB — URINALYSIS, ROUTINE W REFLEX MICROSCOPIC
Bacteria, UA: NONE SEEN
Bilirubin Urine: NEGATIVE
Glucose, UA: >=500 mg/dL — AB
Hgb urine dipstick: NEGATIVE
Ketones, ur: NEGATIVE mg/dL
Leukocytes,Ua: NEGATIVE
Nitrite: NEGATIVE
Protein, ur: 100 mg/dL — AB
Specific Gravity, Urine: 1.021 (ref 1.005–1.030)
pH: 6 (ref 5.0–8.0)

## 2020-12-19 LAB — CBC WITH DIFFERENTIAL/PLATELET
Abs Immature Granulocytes: 0.05 10*3/uL (ref 0.00–0.07)
Basophils Absolute: 0.1 10*3/uL (ref 0.0–0.1)
Basophils Relative: 1 %
Eosinophils Absolute: 0 10*3/uL (ref 0.0–0.5)
Eosinophils Relative: 0 %
HCT: 43.7 % (ref 39.0–52.0)
Hemoglobin: 13.8 g/dL (ref 13.0–17.0)
Immature Granulocytes: 1 %
Lymphocytes Relative: 16 %
Lymphs Abs: 1.2 10*3/uL (ref 0.7–4.0)
MCH: 29.2 pg (ref 26.0–34.0)
MCHC: 31.6 g/dL (ref 30.0–36.0)
MCV: 92.6 fL (ref 80.0–100.0)
Monocytes Absolute: 0.5 10*3/uL (ref 0.1–1.0)
Monocytes Relative: 7 %
Neutro Abs: 5.7 10*3/uL (ref 1.7–7.7)
Neutrophils Relative %: 75 %
Platelets: 177 10*3/uL (ref 150–400)
RBC: 4.72 MIL/uL (ref 4.22–5.81)
RDW: 13.8 % (ref 11.5–15.5)
WBC: 7.5 10*3/uL (ref 4.0–10.5)
nRBC: 0 % (ref 0.0–0.2)

## 2020-12-19 LAB — RESP PANEL BY RT-PCR (FLU A&B, COVID) ARPGX2
Influenza A by PCR: NEGATIVE
Influenza B by PCR: NEGATIVE
SARS Coronavirus 2 by RT PCR: NEGATIVE

## 2020-12-19 LAB — COMPREHENSIVE METABOLIC PANEL
ALT: 14 U/L (ref 0–44)
AST: 15 U/L (ref 15–41)
Albumin: 3.7 g/dL (ref 3.5–5.0)
Alkaline Phosphatase: 66 U/L (ref 38–126)
Anion gap: 13 (ref 5–15)
BUN: 26 mg/dL — ABNORMAL HIGH (ref 8–23)
CO2: 23 mmol/L (ref 22–32)
Calcium: 9.3 mg/dL (ref 8.9–10.3)
Chloride: 103 mmol/L (ref 98–111)
Creatinine, Ser: 1.52 mg/dL — ABNORMAL HIGH (ref 0.61–1.24)
GFR, Estimated: 46 mL/min — ABNORMAL LOW (ref >60)
Glucose, Bld: 152 mg/dL — ABNORMAL HIGH (ref 70–99)
Potassium: 4.5 mmol/L (ref 3.5–5.1)
Sodium: 139 mmol/L (ref 135–145)
Total Bilirubin: 1.2 mg/dL (ref 0.3–1.2)
Total Protein: 7.1 g/dL (ref 6.5–8.1)

## 2020-12-19 LAB — HEMOGLOBIN A1C
Hgb A1c MFr Bld: 8.8 % — ABNORMAL HIGH (ref 4.8–5.6)
Mean Plasma Glucose: 205.86 mg/dL

## 2020-12-19 LAB — CBG MONITORING, ED: Glucose-Capillary: 150 mg/dL — ABNORMAL HIGH (ref 70–99)

## 2020-12-19 LAB — GLUCOSE, CAPILLARY: Glucose-Capillary: 238 mg/dL — ABNORMAL HIGH (ref 70–99)

## 2020-12-19 LAB — BRAIN NATRIURETIC PEPTIDE: B Natriuretic Peptide: 495.2 pg/mL — ABNORMAL HIGH (ref 0.0–100.0)

## 2020-12-19 LAB — TROPONIN I (HIGH SENSITIVITY): Troponin I (High Sensitivity): 35 ng/L — ABNORMAL HIGH (ref <18)

## 2020-12-19 MED ORDER — TAMSULOSIN HCL 0.4 MG PO CAPS
0.4000 mg | ORAL_CAPSULE | Freq: Every day | ORAL | Status: DC
Start: 2020-12-19 — End: 2020-12-20
  Administered 2020-12-19 – 2020-12-20 (×2): 0.4 mg via ORAL
  Filled 2020-12-19 (×2): qty 1

## 2020-12-19 MED ORDER — ROSUVASTATIN CALCIUM 20 MG PO TABS
20.0000 mg | ORAL_TABLET | Freq: Every day | ORAL | Status: DC
Start: 2020-12-19 — End: 2020-12-20
  Administered 2020-12-19 – 2020-12-20 (×2): 20 mg via ORAL
  Filled 2020-12-19 (×2): qty 1

## 2020-12-19 MED ORDER — CANAGLIFLOZIN 100 MG PO TABS
100.0000 mg | ORAL_TABLET | Freq: Every day | ORAL | Status: DC
  Administered 2020-12-20: 100 mg via ORAL
  Filled 2020-12-19: qty 1

## 2020-12-19 MED ORDER — ASPIRIN EC 81 MG PO TBEC
81.0000 mg | DELAYED_RELEASE_TABLET | Freq: Every day | ORAL | Status: DC
  Administered 2020-12-20: 81 mg via ORAL
  Filled 2020-12-19: qty 1

## 2020-12-19 MED ORDER — SODIUM CHLORIDE 0.9% FLUSH: 3.0000 mL | INTRAVENOUS | Status: DC | PRN

## 2020-12-19 MED ORDER — HYDRALAZINE HCL 25 MG PO TABS
25.0000 mg | ORAL_TABLET | Freq: Four times a day (QID) | ORAL | Status: DC | PRN
  Administered 2020-12-19: 25 mg via ORAL
  Filled 2020-12-19: qty 1

## 2020-12-19 MED ORDER — INSULIN GLARGINE 100 UNIT/ML ~~LOC~~ SOLN
20.0000 [IU] | Freq: Two times a day (BID) | SUBCUTANEOUS | Status: DC
  Administered 2020-12-19 – 2020-12-20 (×2): 20 [IU] via SUBCUTANEOUS
  Filled 2020-12-19 (×3): qty 0.2

## 2020-12-19 MED ORDER — ACETAMINOPHEN 325 MG PO TABS
650.0000 mg | ORAL_TABLET | ORAL | Status: DC | PRN
  Administered 2020-12-19 – 2020-12-20 (×2): 650 mg via ORAL
  Filled 2020-12-19 (×2): qty 2

## 2020-12-19 MED ORDER — FUROSEMIDE 10 MG/ML IJ SOLN
40.0000 mg | Freq: Every day | INTRAMUSCULAR | Status: AC
  Administered 2020-12-19 – 2020-12-20 (×2): 40 mg via INTRAVENOUS
  Filled 2020-12-19 (×2): qty 4

## 2020-12-19 MED ORDER — PRASUGREL HCL 10 MG PO TABS
10.0000 mg | ORAL_TABLET | Freq: Every day | ORAL | Status: DC
  Administered 2020-12-20: 10 mg via ORAL
  Filled 2020-12-19: qty 1

## 2020-12-19 MED ORDER — SODIUM CHLORIDE 0.9% FLUSH
3.0000 mL | Freq: Two times a day (BID) | INTRAVENOUS | Status: DC
  Administered 2020-12-19 – 2020-12-20 (×2): 3 mL via INTRAVENOUS

## 2020-12-19 MED ORDER — IRBESARTAN 150 MG PO TABS
300.0000 mg | ORAL_TABLET | Freq: Every day | ORAL | Status: DC
  Administered 2020-12-20: 300 mg via ORAL
  Filled 2020-12-19: qty 2

## 2020-12-19 MED ORDER — GLIMEPIRIDE 4 MG PO TABS
4.0000 mg | ORAL_TABLET | Freq: Every day | ORAL | Status: DC
  Administered 2020-12-20: 4 mg via ORAL
  Filled 2020-12-19: qty 1

## 2020-12-19 MED ORDER — INSULIN ASPART 100 UNIT/ML ~~LOC~~ SOLN
0.0000 [IU] | Freq: Three times a day (TID) | SUBCUTANEOUS | Status: DC
  Administered 2020-12-20: 2 [IU] via SUBCUTANEOUS
  Administered 2020-12-20: 7 [IU] via SUBCUTANEOUS

## 2020-12-19 MED ORDER — SODIUM CHLORIDE 0.9 % IV SOLN: 250.0000 mL | INTRAVENOUS | Status: DC | PRN

## 2020-12-19 MED ORDER — SERTRALINE HCL 50 MG PO TABS
25.0000 mg | ORAL_TABLET | Freq: Every day | ORAL | Status: DC
  Administered 2020-12-19 – 2020-12-20 (×2): 25 mg via ORAL
  Filled 2020-12-19 (×3): qty 1

## 2020-12-19 MED ORDER — SENNA 8.6 MG PO TABS
1.0000 | ORAL_TABLET | Freq: Every day | ORAL | Status: DC
  Administered 2020-12-19: 8.6 mg via ORAL
  Filled 2020-12-19: qty 1

## 2020-12-19 MED ORDER — ONDANSETRON HCL 4 MG/2ML IJ SOLN: 4.0000 mg | Freq: Four times a day (QID) | INTRAMUSCULAR | Status: DC | PRN

## 2020-12-19 MED ORDER — GABAPENTIN 300 MG PO CAPS
300.0000 mg | ORAL_CAPSULE | Freq: Three times a day (TID) | ORAL | Status: DC
  Administered 2020-12-19 – 2020-12-20 (×2): 300 mg via ORAL
  Filled 2020-12-19 (×2): qty 1

## 2020-12-19 MED ORDER — SENNOSIDES 8.6 MG PO TABS: 1.0000 | ORAL_TABLET | Freq: Every day | ORAL | Status: DC

## 2020-12-19 MED ORDER — AMLODIPINE BESYLATE 5 MG PO TABS
5.0000 mg | ORAL_TABLET | Freq: Every day | ORAL | Status: DC
  Administered 2020-12-20: 5 mg via ORAL
  Filled 2020-12-19: qty 1

## 2020-12-19 MED ORDER — CARVEDILOL 12.5 MG PO TABS
12.5000 mg | ORAL_TABLET | Freq: Two times a day (BID) | ORAL | Status: DC
  Administered 2020-12-19 – 2020-12-20 (×2): 12.5 mg via ORAL
  Filled 2020-12-19 (×2): qty 1

## 2020-12-19 MED ORDER — ALLOPURINOL 300 MG PO TABS
300.0000 mg | ORAL_TABLET | Freq: Every day | ORAL | Status: DC
  Administered 2020-12-20: 300 mg via ORAL
  Filled 2020-12-19: qty 1

## 2020-12-19 MED ORDER — CANAGLIFLOZIN 300 MG PO TABS: 300.0000 mg | ORAL_TABLET | Freq: Every day | ORAL | Status: DC

## 2020-12-19 MED ORDER — PANTOPRAZOLE SODIUM 40 MG PO TBEC
40.0000 mg | DELAYED_RELEASE_TABLET | Freq: Every day | ORAL | Status: DC
  Administered 2020-12-20: 40 mg via ORAL
  Filled 2020-12-19: qty 1

## 2020-12-19 MED ORDER — HEPARIN SODIUM (PORCINE) 5000 UNIT/ML IJ SOLN
5000.0000 [IU] | Freq: Two times a day (BID) | INTRAMUSCULAR | Status: DC
  Administered 2020-12-19 – 2020-12-20 (×2): 5000 [IU] via SUBCUTANEOUS
  Filled 2020-12-19 (×2): qty 1

## 2020-12-19 NOTE — H&P (Signed)
History and Physical    John [name] [id number] DOB: 24-Mar-1941 DOA: 12/19/2020  PCP: [name], DO (Confirm with patient/family/NH records and if not entered, this has to be entered at [facility] point of entry) Patient coming from: Home  I have personally briefly reviewed patient's old medical records in [place]  Chief Complaint: SOB  HPI: John [name] is a 80 y.o. male with medical history significant of CAD with multiple stenting with most recent stent placed in 2019 then not recent year stress test afterwards, chronic systolic CHF (LVEF 45 to [id number] and septal akinesis, in 2014), HTN, IDDM, CKD stage II, PVD, OSA on CPAP, presented with increasing shortness of breath.  Started to feel shortness of breath to 3 weeks ago gradually getting worse, denies any chest pain.  Admit orthopnea, dry cough, no fever chills.  Denies any leg swelling.  And also reported his blood pressure has always SBP>150 but no BP meds changes in past 3 years.  ED Course: Blood pressure significantly elevated, no hypoxia x-ray showed pulmonary congestion.  Review of Systems: As per HPI otherwise 14 point review of systems negative.    Past Medical History:  Diagnosis Date  . Anxiety   . 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 BPH (benign prostatic hyperplasia)   . CAD (coronary artery disease)   . Cholesteatoma of attic   . Depression   . Diabetes mellitus ([place])   . Ear disease   . GERD (gastroesophageal reflux disease)   . Hyperlipidemia   . Hypertension   . PVD (peripheral vascular disease) ([place])   . Stroke [facility])     Past Surgical History:  Procedure Laterality Date  . BONE ANCHORED HEARING AID IMPLANT Left 06/25/2013   Procedure: BONE ANCHORED HEARING AID (BAHA) IMPLANT;  Surgeon: Fannie Knee, MD;  Location: [place];  Service: ENT;  Laterality: Left;  . CAROTID STENT INSERTION Left   . COCHLEAR IMPLANT    . CORONARY STENT PLACEMENT    . MASS EXCISION Left 06/25/2013   Procedure: EXCISION  LEFT TEMPORAL MASS;  Surgeon: Fannie Knee, MD;  Location: [place];  Service: ENT;  Laterality: Left;  . TONSILLECTOMY       reports that he quit smoking about 31 years ago. He has never used smokeless tobacco. He reports previous alcohol use. He reports that he does not use drugs.  Allergies  Allergen Reactions  . Penicillins Hives  . Ticagrelor Shortness Of Breath  . Ezetimibe-Simvastatin     Myalgia Other reaction(s): Other, Other (See Comments) Myalgia Myalgia Myalgia     Family History  Problem Relation Age of Onset  . Diabetes Other   . Diabetes Mother   . Diabetes Maternal Aunt   . Depression Other   . Depression Other   . Drug abuse Daughter   . Diabetes Daughter     Prior to Admission medications   Medication Sig Start Date End Date Taking? Authorizing Provider  allopurinol (ZYLOPRIM) 300 MG tablet Take 1 tablet by mouth Daily. 06/21/12   [provider]  amLODipine (NORVASC) 5 MG tablet Take 1 tablet (5 mg total) by mouth daily. NEED OV. 04/12/20   Almyra Deforest, PA  aspirin 81 MG tablet Take 81 mg by mouth daily. Take 1 tab daily    [provider]  canagliflozin (INVOKANA) 100 MG TABS tablet Take 1 tablet by mouth daily. 04/28/20   [provider]  candesartan (ATACAND) 32 MG tablet Take 1 tablet by mouth  daily. 03/01/20   [provider]  carvedilol (COREG) 12.5 MG tablet TAKE 1 TABLET TWICE A DAY WITH MEALS (NEED OFFICE VISIT) 12/24/17   Lelon Perla, MD  clopidogrel (PLAVIX) 75 MG tablet Take 1 tablet (75 mg total) by mouth daily. Patient not taking: Reported on 07/05/2020 05/15/18   Almyra Deforest, PA  CRESTOR 20 MG tablet Take 1 tablet by mouth Daily. 06/10/12   [provider]  Cyanocobalamin (VITAMIN B 12 PO) Take by mouth.    [provider]  gabapentin (NEURONTIN) 300 MG capsule Take 1 capsule by mouth 3 (three) times daily. 08/04/19   [provider]  glimepiride (AMARYL) 4 MG tablet  Take 1 tablet by mouth daily. Take 1 tab daily 05/02/15   [provider]  insulin glargine (LANTUS SOLOSTAR) 100 UNIT/ML Solostar Pen INJECT 24 UNITS EVERY DAY, EXCEPT INJECT 28 UNITS ON SATURDAY AND SUNDAY 10/01/19   [provider]  INVOKANA 300 MG TABS tablet Take 1 tablet by mouth daily. Take 1 tab daily 05/02/15   [provider]  naproxen sodium (ALEVE) 220 MG tablet Take by mouth.    [provider]  pantoprazole (PROTONIX) 40 MG tablet Take by mouth. 08/19/14   [provider]  prasugrel (EFFIENT) 10 MG TABS tablet Take 10 mg by mouth daily. 06/06/20   [provider]  [place] [id number] (90 Base) MCG/ACT AEPB Inhale into the lungs. 06/10/20   [provider]  rosuvastatin (CRESTOR) 20 MG tablet Take 1 tablet by mouth daily. 01/08/20   [provider]  senna (SENOKOT) 8.6 MG tablet Take by mouth.    [provider]  sertraline (ZOLOFT) 25 MG tablet Take 1 tablet (25 mg total) by mouth daily. 08/02/20 10/31/20  Thayer Headings, PMHNP  tamsulosin (FLOMAX) 0.4 MG CAPS capsule Take 1 capsule by mouth daily. 09/22/14   [provider]    Physical Exam: Vitals:   12/19/20 1230 12/19/20 1315 12/19/20 1400 12/19/20 1600  BP: (!) 165/73 (!) 170/71 (!) 171/90 (!) 175/158  Pulse: (!) 57 (!) 52 60 74  Resp: '15 14 18 18  '$ Temp:      TempSrc:      SpO2: 97% 97% 96% (!) 89%    Constitutional: NAD, calm, comfortable Vitals:   12/19/20 1230 12/19/20 1315 12/19/20 1400 12/19/20 1600  BP: (!) 165/73 (!) 170/71 (!) 171/90 (!) 175/158  Pulse: (!) 57 (!) 52 60 74  Resp: '15 14 18 18  '$ Temp:      TempSrc:      SpO2: 97% 97% 96% (!) 89%   Eyes: PERRL, lids and conjunctivae normal ENMT: Mucous membranes are moist. Posterior pharynx clear of any exudate or lesions.Normal dentition.  Neck: normal, supple, no masses, no thyromegaly Respiratory: clear to auscultation bilaterally, no wheezing, fine crackles on B/L  bases. Increasing respiratory effort. No accessory muscle use.  Cardiovascular: Regular rate and rhythm, no murmurs / rubs / gallops. No extremity edema. 2+ pedal pulses. No carotid bruits.  Abdomen: no tenderness, no masses palpated. No hepatosplenomegaly. Bowel sounds positive.  Musculoskeletal: no clubbing / cyanosis. No joint deformity upper and lower extremities. Good ROM, no contractures. Normal muscle tone.  Skin: no rashes, lesions, ulcers. No induration Neurologic: CN 2-12 grossly intact. Sensation intact, DTR normal. Strength 5/5 in all 4.  Psychiatric: Normal judgment and insight. Alert and oriented x 3. Normal mood.     Labs on Admission: I have personally reviewed following labs and imaging studies  CBC: Recent Labs  Lab 12/19/20 1243  WBC 7.5  NEUTROABS 5.7  HGB 13.8  HCT 43.7  MCV 92.6  PLT 123XX123   Basic Metabolic Panel: Recent Labs  Lab 12/19/20 1243  NA 139  K 4.5  CL 103  CO2 23  GLUCOSE 152*  BUN 26*  CREATININE 1.52*  CALCIUM 9.3   GFR: CrCl cannot be calculated (Unknown ideal weight.). Liver Function Tests: Recent Labs  Lab 12/19/20 1243  AST 15  ALT 14  ALKPHOS 66  BILITOT 1.2  PROT 7.1  ALBUMIN 3.7   No results for input(s): LIPASE, AMYLASE in the last 168 hours. No results for input(s): AMMONIA in the last 168 hours. Coagulation Profile: No results for input(s): INR, PROTIME in the last 168 hours. Cardiac Enzymes: No results for input(s): CKTOTAL, CKMB, CKMBINDEX, TROPONINI in the last 168 hours. BNP (last 3 results) No results for input(s): PROBNP in the last 8760 hours. HbA1C: No results for input(s): HGBA1C in the last 72 hours. CBG: No results for input(s): GLUCAP in the last 168 hours. Lipid Profile: No results for input(s): CHOL, HDL, LDLCALC, TRIG, CHOLHDL, LDLDIRECT in the last 72 hours. Thyroid Function Tests: No results for input(s): TSH, T4TOTAL, FREET4, T3FREE, THYROIDAB in the last 72 hours. Anemia Panel: No results  for input(s): VITAMINB12, FOLATE, FERRITIN, TIBC, IRON, RETICCTPCT in the last 72 hours. Urine analysis:    Component Value Date/Time   COLORURINE YELLOW 12/19/2020 1212   APPEARANCEUR CLEAR 12/19/2020 1212   LABSPEC 1.021 12/19/2020 1212   PHURINE 6.0 12/19/2020 1212   GLUCOSEU >=500 (A) 12/19/2020 1212   HGBUR NEGATIVE 12/19/2020 1212   BILIRUBINUR NEGATIVE 12/19/2020 1212   KETONESUR NEGATIVE 12/19/2020 1212   PROTEINUR 100 (A) 12/19/2020 1212   NITRITE NEGATIVE 12/19/2020 1212   LEUKOCYTESUR NEGATIVE 12/19/2020 1212    Radiological Exams on Admission: DG Chest Portable 1 View  Result Date: 12/19/2020 CLINICAL DATA:  Shortness of breath EXAM: PORTABLE CHEST 1 VIEW COMPARISON:  None. FINDINGS: Cardiac shadow is at the upper limits of normal in size. Aortic calcifications are noted. Lungs are well aerated bilaterally. Minimal basilar atelectasis is seen bilaterally. No sizable effusion is noted. No bony abnormality is seen. IMPRESSION: Mild bibasilar atelectasis. Electronically Signed   By: Inez Catalina M.D.   On: 12/19/2020 12:39    EKG: Independently reviewed. Poor R progression, similar ST-T changes as before.  Assessment/Plan Active Problems:   Acute diastolic CHF (congestive heart failure) (HCC)   CHF (congestive heart failure) ([place])  (please populate well all problems here in Problem List. (For example, if patient is on BP meds at home and you resume or decide to hold them, it is a problem that needs to be her. Same for CAD, COPD, HLD and so on)  Acute on chronic systolic CHF decompensation -Signs of fluid overload, blood pressure significantly elevated. -Received 40 mg of Lasix IV at ED -Incrase ARB dose and consider change CCB, Cardio consulted -PRN Hydralazine -Echo -Repeat Xray in AM.  CAD with multiple stenting -Continue ASA and Effient  IDDM with hyperglycemia -Reduce long acting -Continue Ivokana and Amaryl  OSA -CPAP HS  BPH -Flomax  CKD stage  II -with proteinuria, more stringent BP control indicated  PVD -Denies claudication.  DVT prophylaxis: Heparin subQ Code Status: Full Code Family Communication: Daughter at bedside Disposition Plan: Expect more than 2 midnight hospital stay for diuresis and cardiac work-up. Consults called: Cardiology Admission status: Tele admit   Lequita Halt MD [facility] Pager [phone]  12/19/2020, 4:11 PM

## 2020-12-19 NOTE — ED Notes (Signed)
Dinner Tray Ordered @ [phone].

## 2020-12-19 NOTE — ED Provider Notes (Signed)
[facility] EMERGENCY DEPARTMENT Provider Note   CSN: [id number] Arrival date & time: 12/19/20  1115     History Chief Complaint  Patient presents with  . Shortness of Breath    John [name] is a 80 y.o. male.  Patient with history of peripheral vascular disease, coronary artery disease, diabetes, high blood pressure, ischemic cardiomyopathy presents with intermittent shortness of breath with exertion the past few days.  No fevers chills or productive cough.  No known Covid contacts.  Patient denies orthopnea or weight gain, no congestive heart failure diagnosis in the past.  No chest pain.  Patient has had mild fatigue recently.  No leg edema.        Past Medical History:  Diagnosis Date  . Anxiety   . 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 BPH (benign prostatic hyperplasia)   . CAD (coronary artery disease)   . Cholesteatoma of attic   . Depression   . Diabetes mellitus ([place])   . Ear disease   . GERD (gastroesophageal reflux disease)   . Hyperlipidemia   . Hypertension   . PVD (peripheral vascular disease) ([place])   . Stroke [facility])     Patient Active Problem List   Diagnosis Date Noted  . Essential hypertension 10/03/2012  . CAD (coronary artery disease) of artery bypass graft 06/26/2012  . Cardiomyopathy, 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  . Essential hypertension, malignant 06/26/2012  . Hyperlipidemia 06/26/2012  . Preop cardiovascular exam 06/26/2012  . Peripheral vascular disease ([place]) 06/26/2012  . Coronary atherosclerosis of native coronary artery 06/26/2012    Past Surgical History:  Procedure Laterality Date  . BONE ANCHORED HEARING AID IMPLANT Left 06/25/2013   Procedure: BONE ANCHORED HEARING AID (BAHA) IMPLANT;  Surgeon: Fannie Knee, MD;  Location: [facility];  Service: ENT;  Laterality: Left;  . CAROTID STENT INSERTION Left   . COCHLEAR IMPLANT    . CORONARY STENT PLACEMENT    . MASS EXCISION Left 06/25/2013   Procedure: EXCISION LEFT TEMPORAL  MASS;  Surgeon: Fannie Knee, MD;  Location: [facility];  Service: ENT;  Laterality: Left;  . TONSILLECTOMY         Family History  Problem Relation Age of Onset  . Diabetes Other   . Diabetes Mother   . Diabetes Maternal Aunt   . Depression Other   . Depression Other   . Drug abuse Daughter   . Diabetes Daughter     Social History   Tobacco Use  . Smoking status: Former Smoker    Quit date: 05/19/1989    Years since quitting: 31.6  . Smokeless tobacco: Never Used  Substance Use Topics  . Alcohol use: Not Currently  . Drug use: No    Home Medications Prior to Admission medications   Medication Sig Start Date End Date Taking? Authorizing Provider  allopurinol (ZYLOPRIM) 300 MG tablet Take 1 tablet by mouth Daily. 06/21/12   [provider]  amLODipine (NORVASC) 5 MG tablet Take 1 tablet (5 mg total) by mouth daily. NEED OV. 04/12/20   Almyra Deforest, PA  aspirin 81 MG tablet Take 81 mg by mouth daily. Take 1 tab daily    [provider]  canagliflozin (INVOKANA) 100 MG TABS tablet Take 1 tablet by mouth daily. 04/28/20   [provider]  candesartan (ATACAND) 32 MG tablet Take 1 tablet by mouth daily. 03/01/20   [provider]  carvedilol (COREG) 12.5 MG tablet TAKE 1 TABLET TWICE A DAY WITH  MEALS (NEED OFFICE VISIT) 12/24/17   Lelon Perla, MD  clopidogrel (PLAVIX) 75 MG tablet Take 1 tablet (75 mg total) by mouth daily. Patient not taking: Reported on 07/05/2020 05/15/18   Almyra Deforest, PA  CRESTOR 20 MG tablet Take 1 tablet by mouth Daily. 06/10/12   [provider]  Cyanocobalamin (VITAMIN B 12 PO) Take by mouth.    [provider]  gabapentin (NEURONTIN) 300 MG capsule Take 1 capsule by mouth 3 (three) times daily. 08/04/19   [provider]  glimepiride (AMARYL) 4 MG tablet Take 1 tablet by mouth daily. Take 1 tab daily 05/02/15   [provider]  insulin glargine (LANTUS SOLOSTAR) 100 UNIT/ML  Solostar Pen INJECT 24 UNITS EVERY DAY, EXCEPT INJECT 28 UNITS ON SATURDAY AND SUNDAY 10/01/19   [provider]  INVOKANA 300 MG TABS tablet Take 1 tablet by mouth daily. Take 1 tab daily 05/02/15   [provider]  naproxen sodium (ALEVE) 220 MG tablet Take by mouth.    [provider]  pantoprazole (PROTONIX) 40 MG tablet Take by mouth. 08/19/14   [provider]  prasugrel (EFFIENT) 10 MG TABS tablet Take 10 mg by mouth daily. 06/06/20   [provider]  [place] [id number] (90 Base) MCG/ACT AEPB Inhale into the lungs. 06/10/20   [provider]  rosuvastatin (CRESTOR) 20 MG tablet Take 1 tablet by mouth daily. 01/08/20   [provider]  senna (SENOKOT) 8.6 MG tablet Take by mouth.    [provider]  sertraline (ZOLOFT) 25 MG tablet Take 1 tablet (25 mg total) by mouth daily. 08/02/20 10/31/20  Thayer Headings, PMHNP  tamsulosin (FLOMAX) 0.4 MG CAPS capsule Take 1 capsule by mouth daily. 09/22/14   [provider]    Allergies    Ticagrelor, Ezetimibe-simvastatin, and Penicillins  Review of Systems   Review of Systems  Constitutional: Positive for fatigue. Negative for chills and fever.  HENT: Negative for congestion.   Eyes: Negative for visual disturbance.  Respiratory: Positive for shortness of breath.   Cardiovascular: Negative for chest pain.  Gastrointestinal: Negative for abdominal pain and vomiting.  Genitourinary: Negative for dysuria and flank pain.  Musculoskeletal: Negative for back pain, neck pain and neck stiffness.  Skin: Negative for rash.  Neurological: Negative for light-headedness and headaches.    Physical Exam Updated Vital Signs BP (!) 171/90   Pulse 60   Temp 97.8 F (36.6 C) (Oral)   Resp 18   SpO2 96%   Physical Exam Vitals and nursing note reviewed.  Constitutional:      Appearance: He is well-developed and well-nourished.  HENT:     Head: Normocephalic and  atraumatic.  Eyes:     General:        Right eye: No discharge.        Left eye: No discharge.     Conjunctiva/sclera: Conjunctivae normal.  Neck:     Trachea: No tracheal deviation.  Cardiovascular:     Rate and Rhythm: Normal rate and regular rhythm.  Pulmonary:     Effort: Pulmonary effort is normal.     Breath sounds: Examination of the right-lower field reveals rales. Examination of the left-lower field reveals rales. Rales present.  Abdominal:     General: There is no distension.     Palpations: Abdomen is soft.     Tenderness: There is no abdominal tenderness. There is no guarding.  Musculoskeletal:        General:  No edema.     Cervical back: Normal range of motion and neck supple.     Right lower leg: No tenderness. No edema.     Left lower leg: No tenderness. No edema.  Skin:    General: Skin is warm.     Findings: No rash.  Neurological:     Mental Status: He is alert and oriented to person, place, and time.  Psychiatric:        Mood and Affect: Mood and affect normal.     ED Results / Procedures / Treatments   Labs (all labs ordered are listed, but only abnormal results are displayed) Labs Reviewed  COMPREHENSIVE METABOLIC PANEL - Abnormal; Notable for the following components:      Result Value   Glucose, Bld 152 (*)    BUN 26 (*)    Creatinine, Ser 1.52 (*)    GFR, Estimated 46 (*)    All other components within normal limits  BRAIN NATRIURETIC PEPTIDE - Abnormal; Notable for the following components:   B Natriuretic Peptide 495.2 (*)    All other components within normal limits  URINALYSIS, ROUTINE W REFLEX MICROSCOPIC - Abnormal; Notable for the following components:   Glucose, UA >=500 (*)    Protein, ur 100 (*)    All other components within normal limits  TROPONIN I (HIGH SENSITIVITY) - Abnormal; Notable for the following components:   Troponin I (High Sensitivity) 35 (*)    All other components within normal limits  RESP PANEL BY RT-PCR (FLU  A&B, COVID) ARPGX2  CBC WITH DIFFERENTIAL/PLATELET    EKG EKG Interpretation  Date/Time:  Monday December 19 2020 12:30:46 EST Ventricular Rate:  52 PR Interval:    QRS Duration: 107 QT Interval:  501 QTC Calculation: 466 R Axis:   78 Text Interpretation: Sinus rhythm Confirmed by Elnora Morrison [phone]) on 12/19/2020 12:31:34 PM   Radiology DG Chest Portable 1 View  Result Date: 12/19/2020 CLINICAL DATA:  Shortness of breath EXAM: PORTABLE CHEST 1 VIEW COMPARISON:  None. FINDINGS: Cardiac shadow is at the upper limits of normal in size. Aortic calcifications are noted. Lungs are well aerated bilaterally. Minimal basilar atelectasis is seen bilaterally. No sizable effusion is noted. No bony abnormality is seen. IMPRESSION: Mild bibasilar atelectasis. Electronically Signed   By: Inez Catalina M.D.   On: 12/19/2020 12:39    Procedures .Critical Care Performed by: Elnora Morrison, MD Authorized by: Elnora Morrison, MD   Critical care provider statement:    Critical care time (minutes):  35   Critical care start time:  12/19/2020 2:00 PM   Critical care end time:  12/19/2020 2:35 PM   Critical care time was exclusive of:  Separately billable procedures and treating other patients and teaching time   Critical care was necessary to treat or prevent imminent or life-threatening deterioration of the following conditions:  Cardiac failure   Critical care was time spent personally by me on the following activities:  Discussions with consultants, evaluation of patient's response to treatment, examination of patient, ordering and performing treatments and interventions, ordering and review of laboratory studies, ordering and review of radiographic studies, pulse oximetry, re-evaluation of patient's condition, obtaining history from patient or surrogate and review of old charts     Medications Ordered in ED Medications  furosemide (LASIX) injection 40 mg (has no administration in time range)     ED Course  I have reviewed the triage vital signs and the nursing notes.  Pertinent labs &  imaging results that were available during my care of the patient were reviewed by me and considered in my medical decision making (see chart for details).    MDM Rules/Calculators/A&P                          Patient with vascular disease history presents with worsening exertional shortness of breath.  On exam patient well-appearing, vital signs unremarkable except for mild elevated blood pressure.  Normal work of breathing, mild crackles at bases bilateral.  Differential includes mild pulmonary edema/heart failure, pneumonia/Covid, ACS/equivalent, other lung disease, other process.  Plan for general blood work check for anemia, kidney function, electrolytes, BNP/troponin.  EKG reviewed no acute ischemia appreciated.  Patient is interested in follow-up with local cardiology group to [facility].  Patient troponin returned elevated 35, BNP in the 400s reviewed.  Creatinine 1.5.  Chest x-ray reviewed by myself showing mild pulmonary edema/atelectasis.  Discussed with cardiology who evaluated recommend hospital admission for further work-up.  Lasix ordered IV.  Ollie Zamorski was evaluated in Emergency Department on 12/19/2020 for the symptoms described in the history of present illness. He was evaluated in the context of the global COVID-19 pandemic, which necessitated consideration that the patient might be at risk for infection with the SARS-CoV-2 virus that causes COVID-19. Institutional protocols and algorithms that pertain to the evaluation of patients at risk for COVID-19 are in a state of rapid change based on information released by regulatory bodies including the CDC and federal and state organizations. These policies and algorithms were followed during the patient's care in the ED.  Final Clinical Impression(s) / ED Diagnoses Final diagnoses:  Acute dyspnea  Troponin level elevated    Rx / DC  Orders ED Discharge Orders    None       Elnora Morrison, MD 12/19/20 1531

## 2020-12-19 NOTE — ED Notes (Signed)
Pt 96% at rest. Walked to bathroom, began to feel SOB once finished and standing up. On the return trip he briefly dropped to 88% then 91-93%. Once back in bed steadily increased to 98%.

## 2020-12-19 NOTE — ED Notes (Signed)
Report attempted 

## 2020-12-19 NOTE — ED Notes (Signed)
Requested pharm to review meds.

## 2020-12-19 NOTE — ED Notes (Signed)
Admitting provider at bedside.

## 2020-12-19 NOTE — Plan of Care (Signed)

## 2020-12-19 NOTE — Consult Note (Addendum)
Cardiology Consultation:   Patient ID: John [name] MRN: [id number]; DOB: Apr 10, 1941  Admit date: 12/19/2020 Date of Consult: 12/19/2020  PCP:  John [name], [place]  Cardiologist:  Dr John [name] Advanced Practice Provider:  No care team member to display :[id number]    Patient Profile:   John [name] is a 80 y.o. male with a hx of CAD who is being seen today for the evaluation of SOB at the request of Dr John [name]  History of Present Illness:   John [name] is a pleasant 80 year old male who been seen by Dr. Stanford [name] in the past.  The patient had a history of a episode of chest pain in [place] in his 3s.  He says that his "enzymes" were positive and they thought he had had a heart attack.  Later this was put in doubt.  Eventually he did have a heart catheterization in June 2013 when he was in [place].  At that time he received intervention with 2 DES placed to his LAD.  He had a residual 90% circumflex that was treated medically.  His last office visit with Dr. Stanford [name] was in 2017.  He did see [place] once in July 2019 as well for preop clearance.  The patient has a history of vascular disease.  He has a history of a remote common iliac artery PTA.  This apparently subsequently stenosed.  He was told he may need amputation.  His nephew is a Hydrographic surveyor in [place] and in May 2019 the patient went out there for second opinion.  As best I can tell from his history he had some presurgical work-up that suggested possible progression of coronary disease.  He had a heart catheterization done and received multiple stents, I do not have the details.  He then underwent right femoropopliteal bypass graft.   Past Medical History:  Diagnosis Date  . Anxiety   . 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 BPH (benign prostatic hyperplasia)   . CAD (coronary artery disease)   . Cholesteatoma of attic   . Depression   . Diabetes mellitus ([place])   . Ear disease   . GERD (gastroesophageal  reflux disease)   . Hyperlipidemia   . Hypertension   . PVD (peripheral vascular disease) ([place])   . Stroke [facility])     Past Surgical History:  Procedure Laterality Date  . BONE ANCHORED HEARING AID IMPLANT Left 06/25/2013   Procedure: BONE ANCHORED HEARING AID (BAHA) IMPLANT;  Surgeon: John Knee, MD;  Location: [facility];  Service: ENT;  Laterality: Left;  . CAROTID STENT INSERTION Left   . COCHLEAR IMPLANT    . CORONARY STENT PLACEMENT    . MASS EXCISION Left 06/25/2013   Procedure: EXCISION LEFT TEMPORAL MASS;  Surgeon: John Knee, MD;  Location: [facility];  Service: ENT;  Laterality: Left;  . TONSILLECTOMY       Home Medications:  Prior to Admission medications   Medication Sig Start Date End Date Taking? Authorizing Provider  allopurinol (ZYLOPRIM) 300 MG tablet Take 1 tablet by mouth Daily. 06/21/12   [provider]  amLODipine (NORVASC) 5 MG tablet Take 1 tablet (5 mg total) by mouth daily. NEED OV. 04/12/20   Almyra Deforest, PA  aspirin 81 MG tablet Take 81 mg by mouth daily. Take 1 tab daily    [provider]  canagliflozin (INVOKANA) 100 MG TABS tablet Take 1 tablet by mouth daily. 04/28/20   [provider]  candesartan (ATACAND) 32 MG tablet Take 1 tablet by mouth daily. 03/01/20   [provider]  carvedilol (COREG) 12.5 MG tablet TAKE 1 TABLET TWICE A DAY WITH MEALS (NEED OFFICE VISIT) 12/24/17   Lelon Perla, MD  clopidogrel (PLAVIX) 75 MG tablet Take 1 tablet (75 mg total) by mouth daily. Patient not taking: Reported on 07/05/2020 05/15/18   Almyra Deforest, PA  CRESTOR 20 MG tablet Take 1 tablet by mouth Daily. 06/10/12   [provider]  Cyanocobalamin (VITAMIN B 12 PO) Take by mouth.    [provider]  gabapentin (NEURONTIN) 300 MG capsule Take 1 capsule by mouth 3 (three) times daily. 08/04/19   [provider]  glimepiride (AMARYL) 4 MG tablet Take 1 tablet by mouth daily. Take 1 tab  daily 05/02/15   [provider]  insulin glargine (LANTUS SOLOSTAR) 100 UNIT/ML Solostar Pen INJECT 24 UNITS EVERY DAY, EXCEPT INJECT 28 UNITS ON SATURDAY AND SUNDAY 10/01/19   [provider]  INVOKANA 300 MG TABS tablet Take 1 tablet by mouth daily. Take 1 tab daily 05/02/15   [provider]  naproxen sodium (ALEVE) 220 MG tablet Take by mouth.    [provider]  pantoprazole (PROTONIX) 40 MG tablet Take by mouth. 08/19/14   [provider]  prasugrel (EFFIENT) 10 MG TABS tablet Take 10 mg by mouth daily. 06/06/20   [provider]  [place] [id number] (90 Base) MCG/ACT AEPB Inhale into the lungs. 06/10/20   [provider]  rosuvastatin (CRESTOR) 20 MG tablet Take 1 tablet by mouth daily. 01/08/20   [provider]  senna (SENOKOT) 8.6 MG tablet Take by mouth.    [provider]  sertraline (ZOLOFT) 25 MG tablet Take 1 tablet (25 mg total) by mouth daily. 08/02/20 10/31/20  Thayer Headings, PMHNP  tamsulosin (FLOMAX) 0.4 MG CAPS capsule Take 1 capsule by mouth daily. 09/22/14   [provider]    Inpatient Medications: Scheduled Meds:  Continuous Infusions:  PRN Meds:   Allergies:    Allergies  Allergen Reactions  . Ticagrelor Shortness Of Breath  . Ezetimibe-Simvastatin     Myalgia Other reaction(s): Other, Other (See Comments) Myalgia Myalgia Myalgia   . Penicillins     Social History:   Social History   Socioeconomic History  . Marital status: Widowed    Spouse name: Not on file  . Number of children: 3  . Years of education: Not on file  . Highest education level: Not on file  Occupational History  . Not on file  Tobacco Use  . Smoking status: Former Smoker    Quit date: 05/19/1989    Years since quitting: 31.6  . Smokeless tobacco: Never Used  Substance and Sexual Activity  . Alcohol use: Not Currently  . Drug use: No  . Sexual activity: Not on file  Other Topics  Concern  . Not on file  Social History Narrative  . Not on file   Social Determinants of Health   Financial Resource Strain: Not on file  Food Insecurity: Not on file  Transportation Needs: Not on file  Physical Activity: Not on file  Stress: Not on file  Social Connections: Not on file  Intimate Partner Violence: Not on file    Family History:    Family History  Problem Relation Age of Onset  . Diabetes Other   . Diabetes Mother   . Diabetes Maternal Aunt   . Depression Other   .  Depression Other   . Drug abuse Daughter   . Diabetes Daughter      ROS:  Please see the history of present illness.   All other ROS reviewed and negative.     Physical Exam/Data:   Vitals:   12/19/20 1142 12/19/20 1230 12/19/20 1315 12/19/20 1400  BP:  (!) 165/73 (!) 170/71 (!) 171/90  Pulse: (!) 53 (!) 57 (!) 52 60  Resp:  '15 14 18  '$ Temp:      TempSrc:      SpO2:  97% 97% 96%   No intake or output data in the 24 hours ending 12/19/20 1500 Last 3 Weights 05/15/2018 07/27/2016 05/13/2015  Weight (lbs) 181 lb 3.2 oz 188 lb 182 lb 6 oz  Weight (kg) 82.192 kg 85.276 kg 82.725 kg     There is no height or weight on file to calculate BMI.  General:  Overweight Caucasian male, well developed, in no acute distress HEENT: normal Neck: no JVD Vascular: No carotid bruits Cardiac:  normal S1, S2; RRR; no murmur decreased heart sounds Lungs:  Decreased breath sounds, no wheezing, rhonchi or rales  Abd: soft, nontender, no hepatomegaly  Ext: no edema, diminished distal pulses Musculoskeletal:  No deformities, BUE and BLE strength normal and equal Skin: warm and dry  Neuro:  CNs 2-12 intact, no focal abnormalities noted Psych:  Normal affect   EKG:  The EKG was personally reviewed and demonstrates:  NSR, SB 52 Telemetry:  Telemetry was personally reviewed and demonstrates:  NSR, SB  Relevant CV Studies: No recent studies available  Laboratory Data:  High Sensitivity Troponin:   Recent  Labs  Lab 12/19/20 1243  TROPONINIHS 35*     Chemistry Recent Labs  Lab 12/19/20 1243  NA 139  K 4.5  CL 103  CO2 23  GLUCOSE 152*  BUN 26*  CREATININE 1.52*  CALCIUM 9.3  GFRNONAA 46*  ANIONGAP 13    Recent Labs  Lab 12/19/20 1243  PROT 7.1  ALBUMIN 3.7  AST 15  ALT 14  ALKPHOS 66  BILITOT 1.2   Hematology Recent Labs  Lab 12/19/20 1243  WBC 7.5  RBC 4.72  HGB 13.8  HCT 43.7  MCV 92.6  MCH 29.2  MCHC 31.6  RDW 13.8  PLT 177   BNP Recent Labs  Lab 12/19/20 1243  BNP 495.2*    DDimer No results for input(s): DDIMER in the last 168 hours.   Radiology/Studies:  DG Chest Portable 1 View  Result Date: 12/19/2020 CLINICAL DATA:  Shortness of breath EXAM: PORTABLE CHEST 1 VIEW COMPARISON:  None. FINDINGS: Cardiac shadow is at the upper limits of normal in size. Aortic calcifications are noted. Lungs are well aerated bilaterally. Minimal basilar atelectasis is seen bilaterally. No sizable effusion is noted. No bony abnormality is seen. IMPRESSION: Mild bibasilar atelectasis. Electronically Signed   By: Inez Catalina M.D.   On: 12/19/2020 12:39     Assessment and Plan:   Dyspnea- Suspect CHF and COPD  CAD- LAD DES x2 in 2013 PCI with DES x 4-(?) May 2019 in [place]  PVD- Fem Pop Aug 2019 ([place])-followed at [facility]  IDDM- Per PCP  COPD- Pt has COPD and OSA.  C-pap on "back order"  CRI-3 SCr 1.4-1.7  H/O CVA- No details  Plan: MD to see- check echo, Lasix 40 mg daily.  We will arrange op F/U at discharge   Risk Assessment/Risk Scores:    For questions or updates, please contact [facility] Please consult [email or web address] for contact info under    Signed, Kerin Ransom, PA-C  12/19/2020 3:00 PM   As above, patient seen and examined.  Briefly he is an 80 year old male with past medical history of coronary artery disease, peripheral vascular disease, diabetes mellitus, hypertension, hyperlipidemia, chronic stage III kidney disease for  evaluation of dyspnea.  Patient has had multiple PCI's in the past.  Patient typically has dyspnea with moderate activities but not routine activities.  Over the past several days this has worsened and he finds it difficult to ambulate short distances.  He denies orthopnea or pedal edema but he has noted PND.  No chest pain, fevers, chills, productive cough or hemoptysis.  Physical exam not consistent with significant volume overload.  Chest x-ray shows atelectasis.  BUN 26 and creatinine 1.52.  BNP 495.  Hemoglobin 13.8.  Electrocardiogram shows sinus bradycardia with no ST changes.  1 dyspnea-etiology unclear.  He is not particularly volume overloaded on examination but BNP mildly elevated.  Will gently diurese with 40 mg of Lasix IV daily.  Schedule echocardiogram to assess LV function.  There may be a component of COPD.  He also has been diagnosed with sleep apnea and CPAP has not been initiated.  He has no risk factors for pulmonary embolus but will check D-dimer.  2 coronary artery disease-continue aspirin and Plavix.  Continue statin.  3 minimally elevated troponin-patient denies chest pain and no ST changes on electrocardiogram.  Would continue to cycle and if no clear trend would not pursue further ischemia evaluation.  4 hypertension-blood pressure mildly elevated.  Would continue home medications and we will adjust regimen as needed.  5 hyperlipidemia-continue statin.  6 peripheral vascular disease  7 COPD-Per primary care.  Kirk Ruths, MD

## 2020-12-19 NOTE — ED Triage Notes (Signed)
Pt coming from home. Endorses shortness of breath for a few days. Reports it does come and go. Denies chest pain.

## 2020-12-20 ENCOUNTER — Inpatient Hospital Stay (HOSPITAL_COMMUNITY): Payer: Medicare Other

## 2020-12-20 ENCOUNTER — Observation Stay (HOSPITAL_BASED_OUTPATIENT_CLINIC_OR_DEPARTMENT_OTHER): Payer: Medicare Other

## 2020-12-20 DIAGNOSIS — I5031 Acute diastolic (congestive) heart failure: Secondary | ICD-10-CM

## 2020-12-20 DIAGNOSIS — R0600 Dyspnea, unspecified: Secondary | ICD-10-CM

## 2020-12-20 DIAGNOSIS — R778 Other specified abnormalities of plasma proteins: Secondary | ICD-10-CM

## 2020-12-20 DIAGNOSIS — R079 Chest pain, unspecified: Secondary | ICD-10-CM | POA: Diagnosis present

## 2020-12-20 DIAGNOSIS — I5023 Acute on chronic systolic (congestive) heart failure: Secondary | ICD-10-CM | POA: Diagnosis not present

## 2020-12-20 DIAGNOSIS — R0602 Shortness of breath: Secondary | ICD-10-CM | POA: Diagnosis not present

## 2020-12-20 LAB — ECHOCARDIOGRAM COMPLETE
AR max vel: 2.65 cm2
AV Area VTI: 2.9 cm2
AV Area mean vel: 3.5 cm2
AV Mean grad: 3 mmHg
AV Peak grad: 7.8 mmHg
Ao pk vel: 1.4 m/s
Area-P 1/2: 1.96 cm2
Height: 68 in
MV VTI: 2.03 cm2
S' Lateral: 4.2 cm
Weight: 3156.99 oz

## 2020-12-20 LAB — BASIC METABOLIC PANEL
Anion gap: 10 (ref 5–15)
BUN: 30 mg/dL — ABNORMAL HIGH (ref 8–23)
CO2: 24 mmol/L (ref 22–32)
Calcium: 9 mg/dL (ref 8.9–10.3)
Chloride: 103 mmol/L (ref 98–111)
Creatinine, Ser: 1.76 mg/dL — ABNORMAL HIGH (ref 0.61–1.24)
GFR, Estimated: 38 mL/min — ABNORMAL LOW (ref >60)
Glucose, Bld: 178 mg/dL — ABNORMAL HIGH (ref 70–99)
Potassium: 4 mmol/L (ref 3.5–5.1)
Sodium: 137 mmol/L (ref 135–145)

## 2020-12-20 LAB — GLUCOSE, CAPILLARY
Glucose-Capillary: 158 mg/dL — ABNORMAL HIGH (ref 70–99)
Glucose-Capillary: 308 mg/dL — ABNORMAL HIGH (ref 70–99)

## 2020-12-20 LAB — TROPONIN I (HIGH SENSITIVITY): Troponin I (High Sensitivity): 33 ng/L — ABNORMAL HIGH (ref <18)

## 2020-12-20 LAB — D-DIMER, QUANTITATIVE: D-Dimer, Quant: 0.9 ug/mL-FEU — ABNORMAL HIGH (ref 0.00–0.50)

## 2020-12-20 MED ORDER — ALBUTEROL SULFATE HFA 108 (90 BASE) MCG/ACT IN AERS: 2.0000 | INHALATION_SPRAY | Freq: Four times a day (QID) | RESPIRATORY_TRACT | 2 refills | Status: DC | PRN

## 2020-12-20 MED ORDER — PERFLUTREN LIPID MICROSPHERE
1.0000 mL | INTRAVENOUS | Status: DC | PRN
  Administered 2020-12-20: 3 mL via INTRAVENOUS
  Filled 2020-12-20: qty 10

## 2020-12-20 MED ORDER — CARVEDILOL 12.5 MG PO TABS: 12.5000 mg | ORAL_TABLET | Freq: Two times a day (BID) | ORAL | 2 refills | Status: DC

## 2020-12-20 MED ORDER — FUROSEMIDE 20 MG PO TABS: 20.0000 mg | ORAL_TABLET | Freq: Every day | ORAL | 2 refills | Status: DC

## 2020-12-20 MED ORDER — ALBUTEROL SULFATE HFA 108 (90 BASE) MCG/ACT IN AERS
2.0000 | INHALATION_SPRAY | RESPIRATORY_TRACT | Status: DC | PRN
  Filled 2020-12-20: qty 6.7

## 2020-12-20 NOTE — Progress Notes (Signed)
  Echocardiogram 2D Echocardiogram has been performed with [facility].  John [name] 12/20/2020, 1:33 PM

## 2020-12-20 NOTE — Care Management Obs Status (Signed)
[facility] NOTIFICATION   Patient Details  Name: John [name] MRN: [id number] Date of Birth: 03-28-1941   Medicare Observation Status Notification Given:  Yes    Bethena Roys, RN 12/20/2020, 12:29 PM

## 2020-12-20 NOTE — Progress Notes (Signed)
Progress Note  Patient Name: John [name] Date of Encounter: 12/20/2020  [facility] Cardiologist: Kirk Ruths, MD   Subjective   Dyspnea improved; no CP  Inpatient Medications    Scheduled Meds: . allopurinol  300 mg Oral Daily  . amLODipine  5 mg Oral Daily  . aspirin EC  81 mg Oral Daily  . canagliflozin  100 mg Oral Daily  . carvedilol  12.5 mg Oral BID WC  . furosemide  40 mg Intravenous Daily  . gabapentin  300 mg Oral TID  . glimepiride  4 mg Oral Daily  . heparin  5,000 Units Subcutaneous Q12H  . insulin aspart  0-9 Units Subcutaneous TID WC  . insulin glargine  20 Units Subcutaneous BID  . irbesartan  300 mg Oral Daily  . pantoprazole  40 mg Oral Daily  . prasugrel  10 mg Oral Daily  . rosuvastatin  20 mg Oral Daily  . senna  1 tablet Oral QHS  . sertraline  25 mg Oral Daily  . sodium chloride flush  3 mL Intravenous Q12H  . tamsulosin  0.4 mg Oral Daily   Continuous Infusions: . sodium chloride     PRN Meds: sodium chloride, acetaminophen, albuterol, hydrALAZINE, ondansetron (ZOFRAN) IV, sodium chloride flush   Vital Signs    Vitals:   12/19/20 2128 12/20/20 0056 12/20/20 0425 12/20/20 0740  BP: (!) 144/65 (!) 118/50 121/67 (!) 131/53  Pulse: 66 61 (!) 50 (!) 50  Resp:    15  Temp: 98.1 F (36.7 C) (!) 97.5 F (36.4 C) 97.6 F (36.4 C) 97.6 F (36.4 C)  TempSrc: Oral Oral Oral Oral  SpO2: 95% 94% 95% 97%  Weight:   89.5 kg   Height:        Intake/Output Summary (Last 24 hours) at 12/20/2020 0800 Last data filed at 12/20/2020 0554 Gross per 24 hour  Intake 425 ml  Output 1750 ml  Net -1325 ml   Last 3 Weights 12/20/2020 12/19/2020 05/15/2018  Weight (lbs) 197 lb 5 oz 181 lb 181 lb 3.2 oz  Weight (kg) 89.5 kg 82.1 kg 82.192 kg      Telemetry    Sinus - Personally Reviewed   Physical Exam   GEN: No acute distress.   Neck: No JVD Cardiac: RRR, no murmurs, rubs, or gallops.  Respiratory: Diminished BS throughout GI: Soft,  nontender, non-distended  MS: No edema Neuro:  Nonfocal  Psych: Normal affect   Labs    High Sensitivity Troponin:   Recent Labs  Lab 12/19/20 1243  TROPONINIHS 35*      Chemistry Recent Labs  Lab 12/19/20 1243 12/20/20 0246  NA 139 137  K 4.5 4.0  CL 103 103  CO2 23 24  GLUCOSE 152* 178*  BUN 26* 30*  CREATININE 1.52* 1.76*  CALCIUM 9.3 9.0  PROT 7.1  --   ALBUMIN 3.7  --   AST 15  --   ALT 14  --   ALKPHOS 66  --   BILITOT 1.2  --   GFRNONAA 46* 38*  ANIONGAP 13 10     Hematology Recent Labs  Lab 12/19/20 1243  WBC 7.5  RBC 4.72  HGB 13.8  HCT 43.7  MCV 92.6  MCH 29.2  MCHC 31.6  RDW 13.8  PLT 177    BNP Recent Labs  Lab 12/19/20 1243  BNP 495.2*      Radiology    DG Chest Portable 1 View  Result Date:  12/19/2020 CLINICAL DATA:  Shortness of breath EXAM: PORTABLE CHEST 1 VIEW COMPARISON:  None. FINDINGS: Cardiac shadow is at the upper limits of normal in size. Aortic calcifications are noted. Lungs are well aerated bilaterally. Minimal basilar atelectasis is seen bilaterally. No sizable effusion is noted. No bony abnormality is seen. IMPRESSION: Mild bibasilar atelectasis. Electronically Signed   By: Inez Catalina M.D.   On: 12/19/2020 12:39    Patient Profile     80 y.o. male with past medical history of coronary artery disease, peripheral vascular disease, diabetes mellitus, hypertension, hyperlipidemia, chronic stage III kidney disease being evaluated for dyspnea.  Assessment & Plan    1 dyspnea-possibly secondary to acute on chronic diastolic congestive heart failure.  BNP was mildly elevated at time of admission.  Symptoms have improved with Lasix.  We will give Lasix 40 mg IV today and then treat with Lasix 20 mg daily at home.  Await results of echocardiogram.  Note there may also be a contribution from COPD and obstructive sleep apnea.  Await results of D-dimer.  If elevated would plan a VQ scan to rule out pulmonary embolus.  2  coronary artery disease-continue aspirin and effient.  Continue statin.  3 minimally elevated troponin-patient denies chest pain and no ST changes on electrocardiogram.    Will recheck troponin.  If no clear trend will not pursue further ischemia evaluation.  4 hypertension-blood pressure improved; continue present medications and follow.  5 hyperlipidemia-continue statin.  6 peripheral vascular disease  7 COPD-Per primary care.  8 acute on chronic stage III kidney disease-check potassium and renal function 1 week following discharge.  Await results of echocardiogram.  If LV function normal and symptoms improved we will plan discharge later this afternoon with follow-up in the office with APP in 1 approximately 1 to 2 weeks.  Continue present medications other than Lasix 20 mg daily which will be added.  For questions or updates, please contact [place] Please consult [email or web address] for contact info under        Signed, Kirk Ruths, MD  12/20/2020, 8:00 AM

## 2020-12-20 NOTE — Plan of Care (Signed)
Problem: Education: Goal: Knowledge of General Education information will improve Description: Including pain rating scale, medication(s)/side effects and non-pharmacologic comfort measures 12/20/2020 1312 by Lurline Idol, RN Outcome: Adequate for Discharge 12/20/2020 1311 by Lurline Idol, RN Outcome: Adequate for Discharge   Problem: Health Behavior/Discharge Planning: Goal: Ability to manage health-related needs will improve 12/20/2020 1312 by Lurline Idol, RN Outcome: Adequate for Discharge 12/20/2020 1311 by Lurline Idol, RN Outcome: Adequate for Discharge   Problem: Clinical Measurements: Goal: Ability to maintain clinical measurements within normal limits will improve 12/20/2020 1312 by Lurline Idol, RN Outcome: Adequate for Discharge 12/20/2020 1311 by Lurline Idol, RN Outcome: Adequate for Discharge Goal: Will remain free from infection 12/20/2020 1312 by Lurline Idol, RN Outcome: Adequate for Discharge 12/20/2020 1311 by Lurline Idol, RN Outcome: Adequate for Discharge Goal: Diagnostic test results will improve 12/20/2020 1312 by Lurline Idol, RN Outcome: Adequate for Discharge 12/20/2020 1311 by Lurline Idol, RN Outcome: Adequate for Discharge Goal: Respiratory complications will improve 12/20/2020 1312 by Lurline Idol, RN Outcome: Adequate for Discharge 12/20/2020 1311 by Lurline Idol, RN Outcome: Adequate for Discharge Goal: Cardiovascular complication will be avoided 12/20/2020 1312 by Lurline Idol, RN Outcome: Adequate for Discharge 12/20/2020 1311 by Lurline Idol, RN Outcome: Adequate for Discharge   Problem: Activity: Goal: Risk for activity intolerance will decrease 12/20/2020 1312 by Lurline Idol, RN Outcome: Adequate for Discharge 12/20/2020 1311 by Lurline Idol, RN Outcome: Adequate for Discharge   Problem: Nutrition: Goal: Adequate nutrition will be maintained 12/20/2020 1312 by Lurline Idol, RN Outcome: Adequate for Discharge 12/20/2020 1311  by Lurline Idol, RN Outcome: Adequate for Discharge   Problem: Coping: Goal: Level of anxiety will decrease 12/20/2020 1312 by Lurline Idol, RN Outcome: Adequate for Discharge 12/20/2020 1311 by Lurline Idol, RN Outcome: Adequate for Discharge   Problem: Elimination: Goal: Will not experience complications related to bowel motility 12/20/2020 1312 by Lurline Idol, RN Outcome: Adequate for Discharge 12/20/2020 1311 by Lurline Idol, RN Outcome: Adequate for Discharge Goal: Will not experience complications related to urinary retention 12/20/2020 1312 by Lurline Idol, RN Outcome: Adequate for Discharge 12/20/2020 1311 by Lurline Idol, RN Outcome: Adequate for Discharge   Problem: Pain Managment: Goal: General experience of comfort will improve 12/20/2020 1312 by Lurline Idol, RN Outcome: Adequate for Discharge 12/20/2020 1311 by Lurline Idol, RN Outcome: Adequate for Discharge   Problem: Safety: Goal: Ability to remain free from injury will improve 12/20/2020 1312 by Lurline Idol, RN Outcome: Adequate for Discharge 12/20/2020 1311 by Lurline Idol, RN Outcome: Adequate for Discharge   Problem: Skin Integrity: Goal: Risk for impaired skin integrity will decrease 12/20/2020 1312 by Lurline Idol, RN Outcome: Adequate for Discharge 12/20/2020 1311 by Lurline Idol, RN Outcome: Adequate for Discharge   Problem: Education: Goal: Ability to demonstrate management of disease process will improve 12/20/2020 1312 by Lurline Idol, RN Outcome: Adequate for Discharge 12/20/2020 1311 by Lurline Idol, RN Outcome: Adequate for Discharge Goal: Ability to verbalize understanding of medication therapies will improve 12/20/2020 1312 by Lurline Idol, RN Outcome: Adequate for Discharge 12/20/2020 1311 by Lurline Idol, RN Outcome: Adequate for Discharge Goal: Individualized Educational Video(s) 12/20/2020 1312 by Lurline Idol, RN Outcome: Adequate for Discharge 12/20/2020 1311 by Lurline Idol, RN Outcome: Adequate for Discharge   Problem: Activity: Goal: Capacity to carry out  activities will improve 12/20/2020 1312 by Lurline Idol, RN Outcome: Adequate for Discharge 12/20/2020 1311 by Lurline Idol, RN Outcome: Adequate for Discharge   Problem: Cardiac: Goal: Ability to achieve and maintain adequate cardiopulmonary perfusion will improve 12/20/2020 1312 by Lurline Idol, RN Outcome: Adequate for Discharge 12/20/2020 1311 by Lurline Idol, RN Outcome: Adequate for Discharge

## 2020-12-20 NOTE — Discharge Summary (Addendum)
Physician Discharge Summary  John [name] [id number] DOB: [birth year] DOA: 12/19/2020  PCP: [name], DO  Admit date: 12/19/2020 Discharge date: 12/20/2020  Admitted From: Home  Discharge disposition: Home  Recommendations for Outpatient Follow-Up:   . Follow up with your primary care provider in one week.  . Check CBC, BMP, magnesium in the next visit . Follow-up with cardiology as outpatient scheduled by the clinic.   Discharge Diagnosis:   Active Problems:   Acute diastolic CHF (congestive heart failure) (HCC)   CHF (congestive heart failure) ([place])   Discharge Condition: Improved.  Diet recommendation: Low sodium, heart healthy.  Carbohydrate-modified.    Wound care: None.  Code status: Full.   History of Present Illness:   Patient is 80 years old male with past medical history of coronary artery disease status post multiple stent, chronic systolic heart failure, hypertension, diabetes mellitus, CKD stage II, peripheral vascular disease, history of sleep apnea on CPAP presented to the hospital with increasing shortness of breath for 3 weeks with orthopnea with elevated blood pressure.  In the ED, patient was noted to have a chest x-ray which showed pulmonary vascular congestion.  Patient was given IV Lasix and was admitted to the hospital for acute exacerbation of CHF.   Hospital Course:   Following conditions were addressed during hospitalization as listed below,  Dyspnea likely secondary to acute on chronic diastolic CHF decompensation, COPD, history of sleep apnea Received IV diuretics and cardiology was consulted.   Patient improved with diuresis.  Cardiology has recommended patient home at this time with outpatient cardiology follow-up.  2D echocardiogram on 12/20/2020 showed left ventricle ejection fraction of 50 to 55%.  No regional wall motion abnormality  COPD.    Will prescribe inhalers on discharge  CAD with multiple stents No chest pain at this  time.  Continue aspirin and Effient discharge.  Seen by cardiology..  Mildly elevated troponin at 35.  Acute coronary syndrome ruled out  Diabetes mellitus type 2 with hyperglycemia. -Continue Invokana Amaryl and long-acting insulin.   Latest hemoglobin A1c of 8.8.  Obstructive sleep apnea    Patient is supposed to get CPAP set up at home.  BPH -continue Flomax  CKD stage II with proteinuria,  Continue amlodipine, Coreg, candesartan  PVD -Denies claudication.  Continue statins, aspirin Plavix  Disposition.  At this time, patient is stable for disposition home with outpatient PCP and cardiology follow-up  Medical Consultants:    Cardiology  Procedures:    2D echocardiogram Subjective:   Today, patient was seen and examined at bedside.  Feels much better with breathing.  Chest pain, dizziness, lightheadedness,  Discharge Exam:   Vitals:   12/20/20 1029 12/20/20 1108  BP: (!) 118/92 (!) 134/52  Pulse: 64 60  Resp:  18  Temp:  97.8 F (36.6 C)  SpO2:  94%   Vitals:   12/20/20 0425 12/20/20 0740 12/20/20 1029 12/20/20 1108  BP: 121/67 (!) 131/53 (!) 118/92 (!) 134/52  Pulse: (!) 50 (!) 50 64 60  Resp:  15  18  Temp: 97.6 F (36.4 C) 97.6 F (36.4 C)  97.8 F (36.6 C)  TempSrc: Oral Oral  Oral  SpO2: 95% 97%  94%  Weight: 89.5 kg     Height:       Body mass index is 30 kg/m.   General: Alert awake, not in obvious distress on room air HENT: pupils equally reacting to light,  No scleral pallor or icterus noted. Oral mucosa is  moist.  Chest:  Diminished breath sounds bilaterally. CVS: S1 &S2 heard. No murmur.  Regular rate and rhythm. Abdomen: Soft, nontender, nondistended.  Bowel sounds are heard.   Extremities: No cyanosis, clubbing or edema.  Peripheral pulses are palpable. Psych: Alert, awake and oriented, normal mood CNS:  No cranial nerve deficits.  Power equal in all extremities.   Skin: Warm and dry.  No rashes noted.  The results of  significant diagnostics from this hospitalization (including imaging, microbiology, ancillary and laboratory) are listed below for reference.     Diagnostic Studies:   DG Chest 1 View  Result Date: 12/20/2020 CLINICAL DATA:  Shortness of breath EXAM: CHEST  1 VIEW COMPARISON:  12/19/2020 FINDINGS: Cardiac shadow is stable. Aortic calcifications are again noted. Improved aeration is noted in the bases bilaterally. No focal infiltrate or sizable effusion is seen. No bony abnormality is noted. IMPRESSION: Improved aeration in the bases bilaterally. Electronically Signed   By: Inez Catalina M.D.   On: 12/20/2020 08:25   DG Chest Portable 1 View  Result Date: 12/19/2020 CLINICAL DATA:  Shortness of breath EXAM: PORTABLE CHEST 1 VIEW COMPARISON:  None. FINDINGS: Cardiac shadow is at the upper limits of normal in size. Aortic calcifications are noted. Lungs are well aerated bilaterally. Minimal basilar atelectasis is seen bilaterally. No sizable effusion is noted. No bony abnormality is seen. IMPRESSION: Mild bibasilar atelectasis. Electronically Signed   By: Inez Catalina M.D.   On: 12/19/2020 12:39    Labs:   Basic Metabolic Panel: Recent Labs  Lab 12/19/20 1243 12/20/20 0246  NA 139 137  K 4.5 4.0  CL 103 103  CO2 23 24  GLUCOSE 152* 178*  BUN 26* 30*  CREATININE 1.52* 1.76*  CALCIUM 9.3 9.0   GFR Estimated Creatinine Clearance: 35.8 mL/min (A) (by C-G formula based on SCr of 1.76 mg/dL (H)). Liver Function Tests: Recent Labs  Lab 12/19/20 1243  AST 15  ALT 14  ALKPHOS 66  BILITOT 1.2  PROT 7.1  ALBUMIN 3.7   No results for input(s): LIPASE, AMYLASE in the last 168 hours. No results for input(s): AMMONIA in the last 168 hours. Coagulation profile No results for input(s): INR, PROTIME in the last 168 hours.  CBC: Recent Labs  Lab 12/19/20 1243  WBC 7.5  NEUTROABS 5.7  HGB 13.8  HCT 43.7  MCV 92.6  PLT 177   Cardiac Enzymes: No results for input(s): CKTOTAL, CKMB,  CKMBINDEX, TROPONINI in the last 168 hours. BNP: Invalid input(s): POCBNP CBG: Recent Labs  Lab 12/19/20 1650 12/19/20 2131 12/20/20 0738  GLUCAP 150* 238* 158*   D-Dimer Recent Labs    12/20/20 0756  DDIMER 0.90*   Hgb A1c Recent Labs    12/19/20 1719  HGBA1C 8.8*   Lipid Profile No results for input(s): CHOL, HDL, LDLCALC, TRIG, CHOLHDL, LDLDIRECT in the last 72 hours. Thyroid function studies No results for input(s): TSH, T4TOTAL, T3FREE, THYROIDAB in the last 72 hours.  Invalid input(s): FREET3 Anemia work up No results for input(s): VITAMINB12, FOLATE, FERRITIN, TIBC, IRON, RETICCTPCT in the last 72 hours. Microbiology Recent Results (from the past 240 hour(s))  Resp Panel by RT-PCR (Flu A&B, Covid) Nasopharyngeal Swab     Status: None   Collection Time: 12/19/20 12:44 PM   Specimen: Nasopharyngeal Swab; Nasopharyngeal(NP) swabs in vial transport medium  Result Value Ref Range Status   SARS Coronavirus 2 by RT PCR NEGATIVE NEGATIVE Final    Comment: (NOTE) SARS-CoV-2 target nucleic acids  are NOT DETECTED.  The SARS-CoV-2 RNA is generally detectable in upper respiratory specimens during the acute phase of infection. The lowest concentration of SARS-CoV-2 viral copies this assay can detect is 138 copies/mL. A negative result does not preclude SARS-Cov-2 infection and should not be used as the sole basis for treatment or other patient management decisions. A negative result may occur with  improper specimen collection/handling, submission of specimen other than nasopharyngeal swab, presence of viral mutation(s) within the areas targeted by this assay, and inadequate number of viral copies(<138 copies/mL). A negative result must be combined with clinical observations, patient history, and epidemiological information. The expected result is Negative.  Fact Sheet for Patients:  EntrepreneurPulse.com.au  Fact Sheet for Healthcare Providers:   IncredibleEmployment.be  This test is no t yet approved or cleared by the [place] FDA and  has been authorized for detection and/or diagnosis of SARS-CoV-2 by FDA under an Emergency Use Authorization (EUA). This EUA will remain  in effect (meaning this test can be used) for the duration of the COVID-19 declaration under Section 564(b)(1) of the Act, 21 U.S.C.section 360bbb-3(b)(1), unless the authorization is terminated  or revoked sooner.       Influenza A by PCR NEGATIVE NEGATIVE Final   Influenza B by PCR NEGATIVE NEGATIVE Final    Comment: (NOTE) The Xpert Xpress SARS-CoV-2/FLU/RSV plus assay is intended as an aid in the diagnosis of influenza from Nasopharyngeal swab specimens and should not be used as a sole basis for treatment. Nasal washings and aspirates are unacceptable for Xpert Xpress SARS-CoV-2/FLU/RSV testing.  Fact Sheet for Patients: EntrepreneurPulse.com.au  Fact Sheet for Healthcare Providers: IncredibleEmployment.be  This test is not yet approved or cleared by the [place] FDA and has been authorized for detection and/or diagnosis of SARS-CoV-2 by FDA under an Emergency Use Authorization (EUA). This EUA will remain in effect (meaning this test can be used) for the duration of the COVID-19 declaration under Section 564(b)(1) of the Act, 21 U.S.C. section 360bbb-3(b)(1), unless the authorization is terminated or revoked.  Performed at [facility], [place] [address], [place], [place] [address]      Discharge Instructions:   Discharge Instructions    Avoid straining   Complete by: As directed    Diet - low sodium heart healthy   Complete by: As directed    Discharge instructions   Complete by: As directed    Take water pill as prescribed.  Follow-up with your primary care physician and  check your blood work.  Follow-up with cardiology as scheduled by the clinic.   Heart Failure  patients record your daily weight using the same scale at the same time of day   Complete by: As directed    Increase activity slowly   Complete by: As directed    STOP any activity that causes chest pain, shortness of breath, dizziness, sweating, or exessive weakness   Complete by: As directed      Allergies as of 12/20/2020      Reactions   Penicillins Hives   Ticagrelor Shortness Of Breath   Ezetimibe-simvastatin Other (See Comments)   Myalgia      Medication List    TAKE these medications   allopurinol 300 MG tablet Commonly known as: ZYLOPRIM Take 300 mg by mouth daily.   amLODipine 5 MG tablet Commonly known as: NORVASC Take 1 tablet (5 mg total) by mouth daily. NEED OV.   aspirin 81 MG tablet Take 81 mg by mouth daily.  Take 1 tab daily   canagliflozin 100 MG Tabs tablet Commonly known as: INVOKANA Take 100 mg by mouth daily.   candesartan 32 MG tablet Commonly known as: ATACAND Take 32 mg by mouth daily.   carvedilol 12.5 MG tablet Commonly known as: COREG Take 1 tablet (12.5 mg total) by mouth 2 (two) times daily with a meal. What changed:   See the new instructions.  Another medication with the same name was removed. Continue taking this medication, and follow the directions you see here.   clopidogrel 75 MG tablet Commonly known as: PLAVIX Take 1 tablet (75 mg total) by mouth daily.   furosemide 20 MG tablet Commonly known as: Lasix Take 1 tablet (20 mg total) by mouth daily.   gabapentin 300 MG capsule Commonly known as: NEURONTIN Take 300 mg by mouth 3 (three) times daily.   glimepiride 4 MG tablet Commonly known as: AMARYL Take 4 mg by mouth daily.   Lantus SoloStar 100 UNIT/ML Solostar Pen Generic drug: insulin glargine Inject 32 Units into the skin 2 (two) times daily.   pantoprazole 40 MG tablet Commonly known as: PROTONIX Take 40 mg by mouth daily.   prasugrel 10 MG Tabs tablet Commonly known as: EFFIENT Take 10 mg by mouth  daily.   ProAir RespiClick [id number] (90 Base) MCG/ACT Aepb Generic drug: Albuterol Sulfate Inhale 1-2 puffs into the lungs as needed (shortness of breath). What changed: Another medication with the same name was added. Make sure you understand how and when to take each.   albuterol 108 (90 Base) MCG/ACT inhaler Commonly known as: VENTOLIN HFA Inhale 2 puffs into the lungs every 6 (six) hours as needed for wheezing or shortness of breath. What changed: You were already taking a medication with the same name, and this prescription was added. Make sure you understand how and when to take each.   rosuvastatin 20 MG tablet Commonly known as: CRESTOR Take 1 tablet by mouth every evening.   senna 8.6 MG tablet Commonly known as: SENOKOT Take 1 tablet by mouth as needed for constipation.   sertraline 25 MG tablet Commonly known as: ZOLOFT Take 1 tablet (25 mg total) by mouth daily. What changed: when to take this   tamsulosin 0.4 MG Caps capsule Commonly known as: FLOMAX Take 0.4 mg by mouth every evening.   VITAMIN B 12 PO Take 1 tablet by mouth daily.       Follow-up Information    [name], DO. Schedule an appointment as soon as possible for a visit in 1 week(s).   Specialty: Internal Medicine Why: regular followup, blood work Contact information: [place] [address] [phone]        Lelon Perla, MD .   Specialty: Cardiology Contact information: [address] [place] [address] [phone]                Time coordinating discharge: 39 minutes  Signed:  Kaliyah Gladman  [facility] 12/20/2020, 11:43 AM

## 2020-12-20 NOTE — Care Management CC44 (Signed)
Condition Code 44 Documentation Completed  Patient Details  Name: John [name] MRN: [id number] Date of Birth: [birth year]   Condition Code 44 given:  Yes Patient signature on Condition Code 44 notice:  Yes Documentation of 2 MD's agreement:  Yes Code 44 added to claim:  Yes    Bethena Roys, RN 12/20/2020, 12:29 PM

## 2020-12-20 NOTE — Progress Notes (Signed)
Discharge instructions provided to patient and he stated understanding with no questions.

## 2020-12-20 NOTE — Evaluation (Signed)
Physical Therapy Evaluation Patient Details Name: John [name] MRN: [id number] DOB: [birth year] Today's Date: 12/20/2020   History of Present Illness  Pt presented with SOB and found to have acute on chronic diastolic heart failure. PMH - CAD, PVD, DM, HTN, CKD  Clinical Impression  Pt doing well with mobility and no further PT needed.  Ready for dc from PT standpoint.      Follow Up Recommendations No PT follow up    Equipment Recommendations  None recommended by PT    Recommendations for Other Services       Precautions / Restrictions Precautions Precautions: None      Mobility  Bed Mobility Overal bed mobility: Modified Independent                  Transfers Overall transfer level: Modified independent Equipment used: 4-wheeled walker                Ambulation/Gait Ambulation/Gait assistance: Modified independent (Device/Increase time) Gait Distance (Feet): 350 Feet Assistive device: 4-wheeled walker Gait Pattern/deviations: Step-through pattern;Decreased stride length Gait velocity: decr Gait velocity interpretation: 1.31 - 2.62 ft/sec, indicative of limited community ambulator General Gait Details: Steady gait with rollator.  Stairs            Wheelchair Mobility    Modified Rankin (Stroke Patients Only)       Balance Overall balance assessment: Mild deficits observed, not formally tested                                           Pertinent Vitals/Pain Pain Assessment: 0-10 Pain Score: 4  Pain Location: rt ankle Pain Descriptors / Indicators: Aching Pain Intervention(s): Premedicated before session;Limited activity within patient's tolerance    Home Living Family/patient expects to be discharged to:: Private residence Living Arrangements: Children Available Help at Discharge: Family;Available PRN/intermittently Type of Home: House Home Access: Stairs to enter Entrance Stairs-Rails: Right Entrance  Stairs-Number of Steps: 2 Home Layout: One level Home Equipment: Walker - 4 wheels;Cane - single point;Shower seat - built in      Prior Function Level of Independence: Independent with assistive device(s)         Comments: 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     Hand Dominance   Dominant Hand: Right    Extremity/Trunk Assessment   Upper Extremity Assessment Upper Extremity Assessment: Overall WFL for tasks assessed    Lower Extremity Assessment Lower Extremity Assessment: Generalized weakness       Communication   Communication: No difficulties  Cognition Arousal/Alertness: Awake/alert Behavior During Therapy: WFL for tasks assessed/performed Overall Cognitive Status: Within Functional Limits for tasks assessed                                        General Comments General comments (skin integrity, edema, etc.): VSS on RA    Exercises     Assessment/Plan    PT Assessment Patent does not need any further PT services  PT Problem List         PT Treatment Interventions      PT Goals (Current goals can be found in the Care Plan section)  Acute Rehab PT Goals PT Goal Formulation: All assessment and education complete, DC therapy    Frequency  Barriers to discharge        Co-evaluation               AM-PAC PT "6 Clicks" Mobility  Outcome Measure Help needed turning from your back to your side while in a flat bed without using bedrails?: None Help needed moving from lying on your back to sitting on the side of a flat bed without using bedrails?: None Help needed moving to and from a bed to a chair (including a wheelchair)?: None Help needed standing up from a chair using your arms (e.g., wheelchair or bedside chair)?: None Help needed to walk in hospital room?: None Help needed climbing 3-5 steps with a railing? : None 6 Click Score: 24    End of Session   Activity Tolerance: Patient tolerated treatment  well Patient left: in chair;with call bell/phone within reach Nurse Communication: Mobility status PT Visit Diagnosis: Other abnormalities of gait and mobility (R26.89)    Time: [id number] PT Time Calculation (min) (ACUTE ONLY): 18 min   Charges:   PT Evaluation $PT Eval Low Complexity: [place] Pager [phone] Office [place] 12/20/2020, 12:33 PM

## 2020-12-26 ENCOUNTER — Ambulatory Visit (HOSPITAL_COMMUNITY)
Admission: RE | Admit: 2020-12-26 | Discharge: 2020-12-26 | Disposition: A | Discharge status: Home / Self Care | Payer: Medicare Other | Source: Ambulatory Visit | Attending: Internal Medicine | Admitting: Internal Medicine

## 2020-12-26 ENCOUNTER — Other Ambulatory Visit: Payer: Self-pay

## 2020-12-26 DIAGNOSIS — N200 Calculus of kidney: Secondary | ICD-10-CM | POA: Diagnosis present

## 2020-12-27 ENCOUNTER — Telehealth: Payer: Self-pay | Admitting: Cardiology

## 2020-12-27 NOTE — Telephone Encounter (Signed)
Will forward to Dr Olegario Shearer appears on discharge summary Effient and Plavix are listed Is pt to be on both ?

## 2020-12-27 NOTE — Telephone Encounter (Signed)
DC effient; continue ASA 81 mg daily and plavix 75 mg daily John [name]

## 2020-12-27 NOTE — Telephone Encounter (Signed)
Left message for pt to call.

## 2020-12-27 NOTE — Telephone Encounter (Signed)
    Pt c/o medication issue:  1. Name of Medication:   clopidogrel (PLAVIX) 75 MG tablet    2. How are you currently taking this medication (dosage and times per day)?   3. Are you having a reaction (difficulty breathing--STAT)?  4. What is your medication issue? Pt said he is going through his AVS and medications from the hospital, he noticed there's plavix 75 mg. He said he is not been taking this medication and would like to know if Dr. [name] prescribed this to him

## 2020-12-29 MED ORDER — CLOPIDOGREL BISULFATE 75 MG PO TABS: 75.0000 mg | ORAL_TABLET | Freq: Every day | ORAL | 3 refills | Status: DC

## 2020-12-29 NOTE — Telephone Encounter (Signed)
Spoke with pt, Aware of dr crenshaw's recommendations. New script sent to the pharmacy  

## 2021-01-06 ENCOUNTER — Other Ambulatory Visit: Payer: Self-pay | Admitting: Psychiatry

## 2021-01-06 DIAGNOSIS — F330 Major depressive disorder, recurrent, mild: Secondary | ICD-10-CM

## 2021-01-10 NOTE — Progress Notes (Signed)
Cardiology Office Note:    Date:  01/16/2021   ID:  John [name], DOB 1941/03/06, MRN [id number]  PCP:  [name], DO  Cardiologist:  Kirk Ruths, MD   Referring MD: [name], DO   Chief Complaint  Patient presents with  . Hospitalization Follow-up  hospital follow up - HFpEF  History of Present Illness:    John [name] is a 80 y.o. male with a hx of CAD s/p CABG, DM, HTN, HLD, PVD, hx of CVA. Pt had MI at age 82 in [place]. Heart cath completed and medical therapy was recommended. Heart cath in 03/2012 in [place] with EF 40%, hypokinesis of anterior wall, and DES x 2 to LAD and residual Cx disease managed medically.  He has a history of femoropopliteal bypass in August 2019 in [place] followed by [facility] health. He  also states he had DES x4 in 2019 in [place].  He was recently admitted in February 2022 with dyspnea.  Echocardiogram at that time showed normal EF of 50 to 22%, grade 1 diastolic dysfunction, normal RV function, moderate MAC, and no significant valvular disease.  He was gently diuresed although etiology of dyspnea was unclear.   He returns today for follow-up. He is here alone. He denies dyspnea and chest pain. He does report thigh soreness when waking up in the morning and after walking 50 yards. He describes ABIs recently at [facility] and was told his "circulation is good." Sounds like he may have some deconditioning. He is weighing every day and weight is not changing - dry weight is 196-197 lbs.     Past Medical History:  Diagnosis Date  . Anxiety   . 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 BPH (benign prostatic hyperplasia)   . CAD (coronary artery disease)   . Cholesteatoma of attic   . Depression   . Diabetes mellitus ([place])   . Ear disease   . GERD (gastroesophageal reflux disease)   . Hyperlipidemia   . Hypertension   . PVD (peripheral vascular disease) ([place])   . Stroke [facility])     Past Surgical History:  Procedure Laterality Date  . BONE ANCHORED HEARING AID IMPLANT  Left 06/25/2013   Procedure: BONE ANCHORED HEARING AID (BAHA) IMPLANT;  Surgeon: Fannie Knee, MD;  Location: [place];  Service: ENT;  Laterality: Left;  . CAROTID STENT INSERTION Left   . COCHLEAR IMPLANT    . CORONARY STENT PLACEMENT    . MASS EXCISION Left 06/25/2013   Procedure: EXCISION LEFT TEMPORAL MASS;  Surgeon: Fannie Knee, MD;  Location: [place];  Service: ENT;  Laterality: Left;  . TONSILLECTOMY      Current Medications: Current Meds  Medication Sig  . albuterol (VENTOLIN HFA) 108 (90 Base) MCG/ACT inhaler Inhale 2 puffs into the lungs every 6 (six) hours as needed for wheezing or shortness of breath.  . allopurinol (ZYLOPRIM) 300 MG tablet Take 300 mg by mouth daily.  Marland Kitchen amLODipine (NORVASC) 5 MG tablet Take 1 tablet (5 mg total) by mouth daily. NEED OV.  Marland Kitchen aspirin 81 MG tablet Take 81 mg by mouth daily. Take 1 tab daily  . canagliflozin (INVOKANA) 100 MG TABS tablet Take 100 mg by mouth daily.  . candesartan (ATACAND) 32 MG tablet Take 32 mg by mouth daily.  . carvedilol (COREG) 12.5 MG tablet Take 1 tablet (12.5 mg total) by mouth 2 (two) times daily with a meal.  . clopidogrel (PLAVIX) 75 MG tablet Take 1  tablet (75 mg total) by mouth daily.  . Cyanocobalamin (VITAMIN B 12 PO) Take 1 tablet by mouth daily.  . furosemide (LASIX) 20 MG tablet Take 1 tablet (20 mg total) by mouth daily.  Marland Kitchen gabapentin (NEURONTIN) 300 MG capsule Take 300 mg by mouth 3 (three) times daily.  Marland Kitchen glimepiride (AMARYL) 4 MG tablet Take 4 mg by mouth daily.  . insulin glargine (LANTUS SOLOSTAR) 100 UNIT/ML Solostar Pen Inject 32 Units into the skin 2 (two) times daily.  . pantoprazole (PROTONIX) 40 MG tablet Take 40 mg by mouth daily.  . prasugrel (EFFIENT) 10 MG TABS tablet Take 10 mg by mouth daily.  Marland Kitchen PROAIR RESPICLICK 947 (90 Base) MCG/ACT AEPB Inhale 1-2 puffs into the lungs as needed (shortness of breath).  . rosuvastatin (CRESTOR) 20 MG tablet Take 1 tablet by  mouth every evening.  . senna (SENOKOT) 8.6 MG tablet Take 1 tablet by mouth as needed for constipation.  . sertraline (ZOLOFT) 25 MG tablet Take 1 tablet (25 mg total) by mouth every evening.  . tamsulosin (FLOMAX) 0.4 MG CAPS capsule Take 0.4 mg by mouth every evening.     Allergies:   Penicillins, Ticagrelor, and Ezetimibe-simvastatin   Social History   Socioeconomic History  . Marital status: Widowed    Spouse name: Not on file  . Number of children: 3  . Years of education: Not on file  . Highest education level: Not on file  Occupational History  . Not on file  Tobacco Use  . Smoking status: Former Smoker    Quit date: 05/19/1989    Years since quitting: 31.6  . Smokeless tobacco: Never Used  Substance and Sexual Activity  . Alcohol use: Not Currently  . Drug use: No  . Sexual activity: Not on file  Other Topics Concern  . Not on file  Social History Narrative  . Not on file   Social Determinants of Health   Financial Resource Strain: Not on file  Food Insecurity: Not on file  Transportation Needs: Not on file  Physical Activity: Not on file  Stress: Not on file  Social Connections: Not on file     Family History: The patient's family history includes Depression in some other family members; Diabetes in his daughter, maternal aunt, mother, and another family member; Drug abuse in his daughter.  ROS:   Please see the history of present illness.     All other systems reviewed and are negative.  EKGs/Labs/Other Studies Reviewed:    The following studies were reviewed today:  Echo 12/20/20: 1. Mid septal and inferior basal hypokinesis . Left ventricular ejection  fraction, by estimation, is 50 to 55%. The left ventricle has low normal  function. The left ventricle has no regional wall motion abnormalities.  There is moderate left ventricular  hypertrophy. Left ventricular diastolic parameters are consistent with  Grade I diastolic dysfunction (impaired  relaxation).  2. Right ventricular systolic function is normal. The right ventricular  size is normal.  3. The mitral valve is abnormal. Trivial mitral valve regurgitation. No  evidence of mitral stenosis. Moderate mitral annular calcification.  4. The aortic valve was not well visualized. There is moderate  calcification of the aortic valve. There is moderate thickening of the  aortic valve. Aortic valve regurgitation is not visualized. Mild to  moderate aortic valve sclerosis/calcification is  present, without any evidence of aortic stenosis.  5. The inferior vena cava is normal in size with greater than 50%  respiratory  variability, suggesting right atrial pressure of 3 mmHg.   EKG:  EKG is not ordered today.    Recent Labs: 12/19/2020: ALT 14; B Natriuretic Peptide 495.2; Hemoglobin 13.8; Platelets 177 12/20/2020: BUN 30; Creatinine, Ser 1.76; Potassium 4.0; Sodium 137  Recent Lipid Panel No results found for: CHOL, TRIG, HDL, CHOLHDL, VLDL, LDLCALC, LDLDIRECT  Physical Exam:    VS:  BP 110/70   Pulse 72   Ht _0  (1.702 m)   Wt 202 lb 6.4 oz (91.8 kg)   SpO2 98%   BMI 31.70 kg/m     Wt Readings from Last 3 Encounters:  01/16/21 202 lb 6.4 oz (91.8 kg)  12/20/20 197 lb 5 oz (89.5 kg)  05/15/18 181 lb 3.2 oz (82.2 kg)     GEN: elderly male in no acute distress HEENT: Normal NECK: No JVD; No carotid bruits LYMPHATICS: No lymphadenopathy CARDIAC: RRR, no murmurs, rubs, gallops RESPIRATORY:  Clear to auscultation without rales, wheezing or rhonchi  ABDOMEN: Soft, non-tender, non-distended MUSCULOSKELETAL:  No edema; No deformity  SKIN: Warm and dry NEUROLOGIC:  Alert and oriented x 3 PSYCHIATRIC:  Normal affect   ASSESSMENT:    1. Chronic diastolic heart failure ([place])   2. Atherosclerosis of native coronary artery of native heart without angina pectoris   3. Chronic obstructive pulmonary disease, unspecified COPD type ([place])   4. OSA (obstructive sleep apnea)    5. Essential hypertension   6. Peripheral vascular disease ([place])   7. Hyperlipidemia, unspecified hyperlipidemia type    PLAN:    In order of problems listed above:  Chronic diastolic heart failure - maintained on 20 mg lasix - he is doing well from a fluid standpoint - he is weighing daily   COPD OSA - CPAP has been ordered, but recall - he has an upcoming appt to get fitted for it   CAD - continue DAPT, continue statin - he is finishing out his bottle of effient and then will start plavix - no chest pain, not very active   Hypertension - well controlled, no changes   Hyperlipidemia - continue statin - 20 mg crestor - will check with PCP   Peripheral vascular disease - followed at [facility] - sounds like he does have some claudication  He is about to visit his sister in [place] for about 3 months. I have asked him to come in for follow up visit in about 4 months.     Medication Adjustments/Labs and Tests Ordered: Current medicines are reviewed at length with the patient today.  Concerns regarding medicines are outlined above.  No orders of the defined types were placed in this encounter.  No orders of the defined types were placed in this encounter.   Signed, Ledora Bottcher, [place]  01/16/2021 9:33 AM    [facility]

## 2021-01-16 ENCOUNTER — Ambulatory Visit (INDEPENDENT_AMBULATORY_CARE_PROVIDER_SITE_OTHER): Payer: Medicare Other | Admitting: Physician Assistant

## 2021-01-16 ENCOUNTER — Other Ambulatory Visit: Payer: Self-pay

## 2021-01-16 ENCOUNTER — Encounter: Payer: Self-pay | Admitting: Physician Assistant

## 2021-01-16 VITALS — BP 110/70 | HR 72 | Ht 67.0 in | Wt 202.4 lb

## 2021-01-16 DIAGNOSIS — J449 Chronic obstructive pulmonary disease, unspecified: Secondary | ICD-10-CM | POA: Diagnosis not present

## 2021-01-16 DIAGNOSIS — G4733 Obstructive sleep apnea (adult) (pediatric): Secondary | ICD-10-CM | POA: Diagnosis not present

## 2021-01-16 DIAGNOSIS — I5032 Chronic diastolic (congestive) heart failure: Secondary | ICD-10-CM | POA: Diagnosis not present

## 2021-01-16 DIAGNOSIS — I739 Peripheral vascular disease, unspecified: Secondary | ICD-10-CM

## 2021-01-16 DIAGNOSIS — E785 Hyperlipidemia, unspecified: Secondary | ICD-10-CM

## 2021-01-16 DIAGNOSIS — I2510 Atherosclerotic heart disease of native coronary artery without angina pectoris: Secondary | ICD-10-CM

## 2021-01-16 DIAGNOSIS — I10 Essential (primary) hypertension: Secondary | ICD-10-CM

## 2021-01-16 NOTE — Patient Instructions (Signed)
Medication Instructions:  No Changes  *If you need a refill on your cardiac medications before your next appointment, please call your pharmacy*   Lab Work: No Labs If you have labs (blood work) drawn today and your tests are completely normal, you will receive your results only by: Marland Kitchen MyChart Message (if you have MyChart) OR . A paper copy in the mail If you have any lab test that is abnormal or we need to change your treatment, we will call you to review the results.   Testing/Procedures: No Testing   Follow-Up: At [facility], you and your health needs are our priority.  As part of our continuing mission to provide you with exceptional heart care, we have created designated Provider Care Teams.  These Care Teams include your primary Cardiologist (physician) and Advanced Practice Providers (APPs -  Physician Assistants and Nurse Practitioners) who all work together to provide you with the care you need, when you need it.    Your next appointment:   4 month(s)  The format for your next appointment:   In Person  Provider:   Kirk Ruths, MD

## 2021-05-01 NOTE — Progress Notes (Signed)
HPI: FU CAD and chronic diastolic CHF. Patient states he suffered a myocardial infarction at age 80 in [place]. He had a cardiac catheterization but medical therapy was recommended. Cardiac catheterization in June of 2013 in [place] showed EF 40% and there was hypokinesis of the anterior wall. There was a 30-40% mid right coronary artery and a 30-40% mid PDA. The left main was normal. There was an 80% proximal LAD and a 70-80% mid lesion. The circumflex had a 90% lesion after the origin of a first marginal but continued mainly as a moderate size atrial branch. The patient had PCI of his LAD with 2 Ion drug-eluting stents. Medical therapy recommended for the circumflex.  Also with peripheral vascular disease with history of femoropopliteal August 2019.  Patient was admitted February 2022 with acute on chronic diastolic congestive heart failure.  Echocardiogram showed normal LV function, grade 1 diastolic dysfunction.  Patient was diuresed but not clear that this contributed to his dyspnea.  Since last seen he has some dyspnea on exertion unchanged.  No orthopnea, PND, pedal edema, chest pain, palpitations or syncope.  Current Outpatient Medications  Medication Sig Dispense Refill   albuterol (VENTOLIN HFA) 108 (90 Base) MCG/ACT inhaler Inhale 2 puffs into the lungs every 6 (six) hours as needed for wheezing or shortness of breath. 8 g 2   allopurinol (ZYLOPRIM) 300 MG tablet Take 300 mg by mouth daily.     amLODipine (NORVASC) 5 MG tablet Take 1 tablet (5 mg total) by mouth daily. NEED OV. 15 tablet 0   aspirin 81 MG tablet Take 81 mg by mouth daily. Take 1 tab daily     canagliflozin (INVOKANA) 100 MG TABS tablet Take 100 mg by mouth daily.     candesartan (ATACAND) 32 MG tablet Take 32 mg by mouth daily.     carvedilol (COREG) 12.5 MG tablet Take 1 tablet (12.5 mg total) by mouth 2 (two) times daily with a meal. (Patient taking differently: Take 12.5 mg by mouth daily.) 60 tablet 2    clopidogrel (PLAVIX) 75 MG tablet Take 1 tablet (75 mg total) by mouth daily. 90 tablet 3   Cyanocobalamin (VITAMIN B 12 PO) Take 1 tablet by mouth daily.     furosemide (LASIX) 20 MG tablet Take 1 tablet (20 mg total) by mouth daily. 30 tablet 2   gabapentin (NEURONTIN) 300 MG capsule Take 300 mg by mouth 3 (three) times daily.     glimepiride (AMARYL) 4 MG tablet Take 4 mg by mouth daily.     insulin glargine (LANTUS SOLOSTAR) 100 UNIT/ML Solostar Pen Inject 32 Units into the skin 2 (two) times daily.     pantoprazole (PROTONIX) 40 MG tablet Take 40 mg by mouth daily.     rosuvastatin (CRESTOR) 20 MG tablet Take 1 tablet by mouth every evening.     senna (SENOKOT) 8.6 MG tablet Take 1 tablet by mouth as needed for constipation.     sertraline (ZOLOFT) 25 MG tablet Take 1 tablet (25 mg total) by mouth every evening. 90 tablet 0   tamsulosin (FLOMAX) 0.4 MG CAPS capsule Take 0.4 mg by mouth every evening.     No current facility-administered medications for this visit.     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)    BPH (benign prostatic hyperplasia)    CAD (coronary artery disease)    Cholesteatoma of attic    Depression  Diabetes mellitus ([place])    Ear disease    GERD (gastroesophageal reflux disease)    Hyperlipidemia    Hypertension    PVD (peripheral vascular disease) ([place])    Stroke [facility])     Past Surgical History:  Procedure Laterality Date   BONE ANCHORED HEARING AID IMPLANT Left 06/25/2013   Procedure: BONE ANCHORED HEARING AID (BAHA) IMPLANT;  Surgeon: Fannie Knee, MD;  Location: [place];  Service: ENT;  Laterality: Left;   CAROTID STENT INSERTION Left    COCHLEAR IMPLANT     CORONARY STENT PLACEMENT     MASS EXCISION Left 06/25/2013   Procedure: EXCISION LEFT TEMPORAL MASS;  Surgeon: Fannie Knee, MD;  Location: [place];  Service: ENT;  Laterality: Left;   TONSILLECTOMY      Social History    Socioeconomic History   Marital status: Widowed    Spouse name: Not on file   Number of children: 3   Years of education: Not on file   Highest education level: Not on file  Occupational History   Not on file  Tobacco Use   Smoking status: Former    Types: Cigarettes    Quit date: 05/19/1989    Years since quitting: 31.9   Smokeless tobacco: Never  Substance and Sexual Activity   Alcohol use: Not Currently   Drug use: No   Sexual activity: Not on file  Other Topics Concern   Not on file  Social History Narrative   Not on file   Social Determinants of Health   Financial Resource Strain: Not on file  Food Insecurity: Not on file  Transportation Needs: Not on file  Physical Activity: Not on file  Stress: Not on file  Social Connections: Not on file  Intimate Partner Violence: Not on file    Family History  Problem Relation Age of Onset   Diabetes Other    Diabetes Mother    Diabetes Maternal Aunt    Depression Other    Depression Other    Drug abuse Daughter    Diabetes Daughter     ROS: no fevers or chills, productive cough, hemoptysis, dysphasia, odynophagia, melena, hematochezia, dysuria, hematuria, rash, seizure activity, orthopnea, PND, pedal edema, claudication. Remaining systems are negative.  Physical Exam: Well-developed well-nourished in no acute distress.  Skin is warm and dry.  HEENT is normal.  Neck is supple.  Chest is clear to auscultation with normal expansion.  Cardiovascular exam is regular rate and rhythm.  Abdominal exam nontender or distended. No masses palpated. Extremities show no edema. neuro grossly intact  ECG-normal sinus rhythm at a rate of 65, RV conduction delay.  Personally reviewed  A/P  1 chronic diastolic congestive heart failure-patient appears to be euvolemic on examination.  We will continue Lasix at present dose.  Check potassium and renal function.  2 coronary artery disease-plan to continue aspirin and statin.   Discontinue Plavix.  3 hypertension-blood pressure controlled.  Will change carvedilol to twice daily dosing.  4 hyperlipidemia-continue rosuvastatin.  5 peripheral vascular disease-followed at [facility].  6 history of obstructive sleep apnea-  Kirk Ruths, MD

## 2021-05-05 ENCOUNTER — Other Ambulatory Visit: Payer: Self-pay

## 2021-05-05 ENCOUNTER — Ambulatory Visit (INDEPENDENT_AMBULATORY_CARE_PROVIDER_SITE_OTHER): Payer: Medicare Other | Admitting: Cardiology

## 2021-05-05 ENCOUNTER — Encounter: Payer: Self-pay | Admitting: Cardiology

## 2021-05-05 VITALS — BP 140/60 | HR 74 | Ht 67.0 in | Wt 194.6 lb

## 2021-05-05 DIAGNOSIS — I2510 Atherosclerotic heart disease of native coronary artery without angina pectoris: Secondary | ICD-10-CM

## 2021-05-05 DIAGNOSIS — I5032 Chronic diastolic (congestive) heart failure: Secondary | ICD-10-CM | POA: Diagnosis not present

## 2021-05-05 DIAGNOSIS — I739 Peripheral vascular disease, unspecified: Secondary | ICD-10-CM

## 2021-05-05 DIAGNOSIS — I10 Essential (primary) hypertension: Secondary | ICD-10-CM

## 2021-05-05 MED ORDER — CARVEDILOL 12.5 MG PO TABS: 12.5000 mg | ORAL_TABLET | Freq: Two times a day (BID) | ORAL | 3 refills | Status: DC

## 2021-05-05 NOTE — Patient Instructions (Addendum)
Medication Instructions:   INCREASE CARVEDILOL TO 12,5 MG TWICE DAILY  STOP CLOPIDOGREL   *If you need a refill on your cardiac medications before your next appointment, please call your pharmacy   Follow-Up: At [facility], you and your health needs are our priority.  As part of our continuing mission to provide you with exceptional heart care, we have created designated Provider Care Teams.  These Care Teams include your primary Cardiologist (physician) and Advanced Practice Providers (APPs -  Physician Assistants and Nurse Practitioners) who all work together to provide you with the care you need, when you need it.  We recommend signing up for the patient portal called "MyChart".  Sign up information is provided on this After Visit Summary.  MyChart is used to connect with patients for Virtual Visits (Telemedicine).  Patients are able to view lab/test results, encounter notes, upcoming appointments, etc.  Non-urgent messages can be sent to your provider as well.   To learn more about what you can do with MyChart, go to [email or web address].    Your next appointment:   6 month(s)  The format for your next appointment:   In Person  Provider:   Kirk Ruths, MD

## 2021-05-06 LAB — BASIC METABOLIC PANEL
BUN/Creatinine Ratio: 13 (ref 10–24)
BUN: 31 mg/dL — ABNORMAL HIGH (ref 8–27)
CO2: 23 mmol/L (ref 20–29)
Calcium: 9.2 mg/dL (ref 8.6–10.2)
Chloride: 100 mmol/L (ref 96–106)
Creatinine, Ser: 2.3 mg/dL — ABNORMAL HIGH (ref 0.76–1.27)
Glucose: 230 mg/dL — ABNORMAL HIGH (ref 65–99)
Potassium: 5.2 mmol/L (ref 3.5–5.2)
Sodium: 141 mmol/L (ref 134–144)
eGFR: 28 mL/min/{1.73_m2} — ABNORMAL LOW (ref >59)

## 2021-05-08 ENCOUNTER — Telehealth: Payer: Self-pay | Admitting: *Deleted

## 2021-05-08 ENCOUNTER — Encounter: Payer: Self-pay | Admitting: *Deleted

## 2021-05-08 DIAGNOSIS — I5032 Chronic diastolic (congestive) heart failure: Secondary | ICD-10-CM

## 2021-05-08 MED ORDER — FUROSEMIDE 20 MG PO TABS: 20.0000 mg | ORAL_TABLET | ORAL | 2 refills | Status: DC

## 2021-05-08 NOTE — Telephone Encounter (Signed)
-----   Message from Lelon Perla, MD sent at 05/07/2021  3:42 PM EDT ----- Change lasix to 20 mg every other day; additional 20 mg daily for weight gain of 2-3 lbs; bmet 2 weeks Kirk Ruths

## 2021-05-08 NOTE — Telephone Encounter (Signed)
pt aware of results  Lab orders mailed to the pt  

## 2021-05-08 NOTE — Telephone Encounter (Signed)
This encounter was created in error - please disregard.

## 2021-06-27 ENCOUNTER — Other Ambulatory Visit: Payer: Self-pay

## 2021-06-27 ENCOUNTER — Telehealth: Payer: Self-pay | Admitting: Cardiology

## 2021-06-27 MED ORDER — ROSUVASTATIN CALCIUM 20 MG PO TABS: 20.0000 mg | ORAL_TABLET | Freq: Every evening | ORAL | 3 refills | Status: DC

## 2021-06-27 NOTE — Telephone Encounter (Signed)
*  STAT* If patient is at the pharmacy, call can be transferred to refill team.   1. Which medications need to be refilled? (please list name of each medication and dose if known) rosuvastatin (CRESTOR) 20 MG tablet  2. Which pharmacy/location (including street and city if local pharmacy) is medication to be sent to? [address]  3. Do they need a 30 day or 90 day supply? [id number] [place] [address] [place] medication..Marland Kitchenwould like to have it replaced... please advise

## 2021-07-13 ENCOUNTER — Other Ambulatory Visit: Payer: Self-pay | Admitting: Physician Assistant

## 2021-07-13 ENCOUNTER — Other Ambulatory Visit: Payer: Self-pay | Admitting: Psychiatry

## 2021-07-13 DIAGNOSIS — F330 Major depressive disorder, recurrent, mild: Secondary | ICD-10-CM

## 2021-07-14 NOTE — Telephone Encounter (Signed)
Please schedule appt

## 2021-07-17 NOTE — Telephone Encounter (Signed)
Pt stated he was in [place].  He said to forget about the refill.  He will call back when he can make an appt to be seen.

## 2021-09-10 ENCOUNTER — Other Ambulatory Visit: Payer: Self-pay | Admitting: Cardiology

## 2021-09-13 ENCOUNTER — Inpatient Hospital Stay (HOSPITAL_COMMUNITY)
Admission: EM | Admit: 2021-09-13 | Discharge: 2021-09-15 | DRG: 291 | Disposition: A | Discharge status: Home / Self Care | Payer: Medicare Other | Attending: Internal Medicine | Admitting: Internal Medicine

## 2021-09-13 ENCOUNTER — Encounter (HOSPITAL_COMMUNITY): Payer: Self-pay

## 2021-09-13 ENCOUNTER — Other Ambulatory Visit (HOSPITAL_COMMUNITY): Payer: Medicare Other

## 2021-09-13 ENCOUNTER — Other Ambulatory Visit: Payer: Self-pay

## 2021-09-13 ENCOUNTER — Emergency Department (HOSPITAL_COMMUNITY): Payer: Medicare Other

## 2021-09-13 DIAGNOSIS — Z794 Long term (current) use of insulin: Secondary | ICD-10-CM

## 2021-09-13 DIAGNOSIS — J9622 Acute and chronic respiratory failure with hypercapnia: Secondary | ICD-10-CM | POA: Diagnosis present

## 2021-09-13 DIAGNOSIS — Z833 Family history of diabetes mellitus: Secondary | ICD-10-CM

## 2021-09-13 DIAGNOSIS — I509 Heart failure, unspecified: Secondary | ICD-10-CM

## 2021-09-13 DIAGNOSIS — J449 Chronic obstructive pulmonary disease, unspecified: Secondary | ICD-10-CM | POA: Diagnosis present

## 2021-09-13 DIAGNOSIS — E1169 Type 2 diabetes mellitus with other specified complication: Secondary | ICD-10-CM

## 2021-09-13 DIAGNOSIS — R338 Other retention of urine: Secondary | ICD-10-CM | POA: Diagnosis present

## 2021-09-13 DIAGNOSIS — J9601 Acute respiratory failure with hypoxia: Secondary | ICD-10-CM | POA: Diagnosis not present

## 2021-09-13 DIAGNOSIS — E1140 Type 2 diabetes mellitus with diabetic neuropathy, unspecified: Secondary | ICD-10-CM

## 2021-09-13 DIAGNOSIS — Z6830 Body mass index (BMI) 30.0-30.9, adult: Secondary | ICD-10-CM | POA: Diagnosis not present

## 2021-09-13 DIAGNOSIS — I130 Hypertensive heart and chronic kidney disease with heart failure and stage 1 through stage 4 chronic kidney disease, or unspecified chronic kidney disease: Secondary | ICD-10-CM | POA: Diagnosis present

## 2021-09-13 DIAGNOSIS — F32A Depression, unspecified: Secondary | ICD-10-CM | POA: Diagnosis present

## 2021-09-13 DIAGNOSIS — I10 Essential (primary) hypertension: Secondary | ICD-10-CM | POA: Diagnosis not present

## 2021-09-13 DIAGNOSIS — Z7982 Long term (current) use of aspirin: Secondary | ICD-10-CM

## 2021-09-13 DIAGNOSIS — Z955 Presence of coronary angioplasty implant and graft: Secondary | ICD-10-CM

## 2021-09-13 DIAGNOSIS — I25810 Atherosclerosis of coronary artery bypass graft(s) without angina pectoris: Secondary | ICD-10-CM | POA: Diagnosis present

## 2021-09-13 DIAGNOSIS — E8729 Other acidosis: Secondary | ICD-10-CM | POA: Diagnosis present

## 2021-09-13 DIAGNOSIS — E669 Obesity, unspecified: Secondary | ICD-10-CM | POA: Diagnosis present

## 2021-09-13 DIAGNOSIS — R778 Other specified abnormalities of plasma proteins: Secondary | ICD-10-CM

## 2021-09-13 DIAGNOSIS — Z880 Allergy status to penicillin: Secondary | ICD-10-CM

## 2021-09-13 DIAGNOSIS — Z9621 Cochlear implant status: Secondary | ICD-10-CM | POA: Diagnosis present

## 2021-09-13 DIAGNOSIS — E1151 Type 2 diabetes mellitus with diabetic peripheral angiopathy without gangrene: Secondary | ICD-10-CM | POA: Diagnosis present

## 2021-09-13 DIAGNOSIS — I2510 Atherosclerotic heart disease of native coronary artery without angina pectoris: Secondary | ICD-10-CM | POA: Diagnosis present

## 2021-09-13 DIAGNOSIS — N401 Enlarged prostate with lower urinary tract symptoms: Secondary | ICD-10-CM | POA: Diagnosis present

## 2021-09-13 DIAGNOSIS — I161 Hypertensive emergency: Secondary | ICD-10-CM | POA: Diagnosis present

## 2021-09-13 DIAGNOSIS — H9190 Unspecified hearing loss, unspecified ear: Secondary | ICD-10-CM | POA: Diagnosis present

## 2021-09-13 DIAGNOSIS — I5033 Acute on chronic diastolic (congestive) heart failure: Secondary | ICD-10-CM | POA: Diagnosis present

## 2021-09-13 DIAGNOSIS — I252 Old myocardial infarction: Secondary | ICD-10-CM

## 2021-09-13 DIAGNOSIS — N1832 Chronic kidney disease, stage 3b: Secondary | ICD-10-CM | POA: Diagnosis present

## 2021-09-13 DIAGNOSIS — Z818 Family history of other mental and behavioral disorders: Secondary | ICD-10-CM

## 2021-09-13 DIAGNOSIS — J9621 Acute and chronic respiratory failure with hypoxia: Secondary | ICD-10-CM | POA: Diagnosis present

## 2021-09-13 DIAGNOSIS — G4733 Obstructive sleep apnea (adult) (pediatric): Secondary | ICD-10-CM | POA: Diagnosis present

## 2021-09-13 DIAGNOSIS — J441 Chronic obstructive pulmonary disease with (acute) exacerbation: Secondary | ICD-10-CM | POA: Diagnosis present

## 2021-09-13 DIAGNOSIS — J61 Pneumoconiosis due to asbestos and other mineral fibers: Secondary | ICD-10-CM | POA: Diagnosis present

## 2021-09-13 DIAGNOSIS — K219 Gastro-esophageal reflux disease without esophagitis: Secondary | ICD-10-CM | POA: Diagnosis present

## 2021-09-13 DIAGNOSIS — E0842 Diabetes mellitus due to underlying condition with diabetic polyneuropathy: Secondary | ICD-10-CM | POA: Diagnosis not present

## 2021-09-13 DIAGNOSIS — I248 Other forms of acute ischemic heart disease: Secondary | ICD-10-CM | POA: Diagnosis present

## 2021-09-13 DIAGNOSIS — D72829 Elevated white blood cell count, unspecified: Secondary | ICD-10-CM

## 2021-09-13 DIAGNOSIS — J9602 Acute respiratory failure with hypercapnia: Secondary | ICD-10-CM

## 2021-09-13 DIAGNOSIS — F419 Anxiety disorder, unspecified: Secondary | ICD-10-CM | POA: Diagnosis present

## 2021-09-13 DIAGNOSIS — E1142 Type 2 diabetes mellitus with diabetic polyneuropathy: Secondary | ICD-10-CM | POA: Diagnosis present

## 2021-09-13 DIAGNOSIS — E1122 Type 2 diabetes mellitus with diabetic chronic kidney disease: Secondary | ICD-10-CM | POA: Diagnosis present

## 2021-09-13 DIAGNOSIS — Z20822 Contact with and (suspected) exposure to covid-19: Secondary | ICD-10-CM | POA: Diagnosis present

## 2021-09-13 DIAGNOSIS — Z8673 Personal history of transient ischemic attack (TIA), and cerebral infarction without residual deficits: Secondary | ICD-10-CM

## 2021-09-13 DIAGNOSIS — N183 Chronic kidney disease, stage 3 unspecified: Secondary | ICD-10-CM

## 2021-09-13 DIAGNOSIS — N400 Enlarged prostate without lower urinary tract symptoms: Secondary | ICD-10-CM

## 2021-09-13 DIAGNOSIS — Z888 Allergy status to other drugs, medicaments and biological substances status: Secondary | ICD-10-CM

## 2021-09-13 DIAGNOSIS — I5043 Acute on chronic combined systolic (congestive) and diastolic (congestive) heart failure: Secondary | ICD-10-CM

## 2021-09-13 DIAGNOSIS — I739 Peripheral vascular disease, unspecified: Secondary | ICD-10-CM

## 2021-09-13 DIAGNOSIS — J9611 Chronic respiratory failure with hypoxia: Secondary | ICD-10-CM

## 2021-09-13 DIAGNOSIS — Z79899 Other long term (current) drug therapy: Secondary | ICD-10-CM

## 2021-09-13 DIAGNOSIS — E782 Mixed hyperlipidemia: Secondary | ICD-10-CM | POA: Diagnosis present

## 2021-09-13 DIAGNOSIS — Z9114 Patient's other noncompliance with medication regimen: Secondary | ICD-10-CM

## 2021-09-13 DIAGNOSIS — Z87891 Personal history of nicotine dependence: Secondary | ICD-10-CM

## 2021-09-13 DIAGNOSIS — E1165 Type 2 diabetes mellitus with hyperglycemia: Secondary | ICD-10-CM | POA: Diagnosis present

## 2021-09-13 DIAGNOSIS — Z91199 Patient's noncompliance with other medical treatment and regimen due to unspecified reason: Secondary | ICD-10-CM

## 2021-09-13 LAB — CBC WITH DIFFERENTIAL/PLATELET
Abs Immature Granulocytes: 0.1 10*3/uL — ABNORMAL HIGH (ref 0.00–0.07)
Basophils Absolute: 0.1 10*3/uL (ref 0.0–0.1)
Basophils Relative: 1 %
Eosinophils Absolute: 0.6 10*3/uL — ABNORMAL HIGH (ref 0.0–0.5)
Eosinophils Relative: 4 %
HCT: 45.8 % (ref 39.0–52.0)
Hemoglobin: 14.7 g/dL (ref 13.0–17.0)
Immature Granulocytes: 1 %
Lymphocytes Relative: 12 %
Lymphs Abs: 1.8 10*3/uL (ref 0.7–4.0)
MCH: 29.4 pg (ref 26.0–34.0)
MCHC: 32.1 g/dL (ref 30.0–36.0)
MCV: 91.6 fL (ref 80.0–100.0)
Monocytes Absolute: 1 10*3/uL (ref 0.1–1.0)
Monocytes Relative: 7 %
Neutro Abs: 11.9 10*3/uL — ABNORMAL HIGH (ref 1.7–7.7)
Neutrophils Relative %: 75 %
Platelets: 162 10*3/uL (ref 150–400)
RBC: 5 MIL/uL (ref 4.22–5.81)
RDW: 13.3 % (ref 11.5–15.5)
WBC: 15.5 10*3/uL — ABNORMAL HIGH (ref 4.0–10.5)
nRBC: 0 % (ref 0.0–0.2)

## 2021-09-13 LAB — COMPREHENSIVE METABOLIC PANEL
ALT: 12 U/L (ref 0–44)
AST: 17 U/L (ref 15–41)
Albumin: 4 g/dL (ref 3.5–5.0)
Alkaline Phosphatase: 67 U/L (ref 38–126)
Anion gap: 10 (ref 5–15)
BUN: 30 mg/dL — ABNORMAL HIGH (ref 8–23)
CO2: 23 mmol/L (ref 22–32)
Calcium: 8.8 mg/dL — ABNORMAL LOW (ref 8.9–10.3)
Chloride: 102 mmol/L (ref 98–111)
Creatinine, Ser: 1.98 mg/dL — ABNORMAL HIGH (ref 0.61–1.24)
GFR, Estimated: 33 mL/min — ABNORMAL LOW (ref >60)
Glucose, Bld: 174 mg/dL — ABNORMAL HIGH (ref 70–99)
Potassium: 4.8 mmol/L (ref 3.5–5.1)
Sodium: 135 mmol/L (ref 135–145)
Total Bilirubin: 1.2 mg/dL (ref 0.3–1.2)
Total Protein: 7.6 g/dL (ref 6.5–8.1)

## 2021-09-13 LAB — BLOOD GAS, ARTERIAL
Acid-base deficit: 4.1 mmol/L — ABNORMAL HIGH (ref 0.0–2.0)
Bicarbonate: 20.3 mmol/L (ref 20.0–28.0)
Drawn by: 21310
FIO2: 50
O2 Saturation: 91.9 %
Patient temperature: 37
pCO2 arterial: 48.2 mmHg — ABNORMAL HIGH (ref 32.0–48.0)
pH, Arterial: 7.276 — ABNORMAL LOW (ref 7.350–7.450)
pO2, Arterial: 74.3 mmHg — ABNORMAL LOW (ref 83.0–108.0)

## 2021-09-13 LAB — GLUCOSE, CAPILLARY
Glucose-Capillary: 369 mg/dL — ABNORMAL HIGH (ref 70–99)
Glucose-Capillary: 220 mg/dL — ABNORMAL HIGH (ref 70–99)

## 2021-09-13 LAB — RESP PANEL BY RT-PCR (FLU A&B, COVID) ARPGX2
Influenza A by PCR: NEGATIVE
Influenza B by PCR: NEGATIVE
SARS Coronavirus 2 by RT PCR: NEGATIVE

## 2021-09-13 LAB — TROPONIN I (HIGH SENSITIVITY)
Troponin I (High Sensitivity): 34 ng/L — ABNORMAL HIGH (ref <18)
Troponin I (High Sensitivity): 82 ng/L — ABNORMAL HIGH (ref <18)
Troponin I (High Sensitivity): 489 ng/L (ref <18)
Troponin I (High Sensitivity): 484 ng/L (ref <18)

## 2021-09-13 LAB — BRAIN NATRIURETIC PEPTIDE: B Natriuretic Peptide: 596 pg/mL — ABNORMAL HIGH (ref 0.0–100.0)

## 2021-09-13 LAB — CBG MONITORING, ED
Glucose-Capillary: 214 mg/dL — ABNORMAL HIGH (ref 70–99)
Glucose-Capillary: 387 mg/dL — ABNORMAL HIGH (ref 70–99)

## 2021-09-13 LAB — HEMOGLOBIN A1C
Hgb A1c MFr Bld: 8.7 % — ABNORMAL HIGH (ref 4.8–5.6)
Mean Plasma Glucose: 202.99 mg/dL

## 2021-09-13 MED ORDER — PANTOPRAZOLE SODIUM 40 MG PO TBEC
40.0000 mg | DELAYED_RELEASE_TABLET | Freq: Every day | ORAL | Status: DC
  Administered 2021-09-13 – 2021-09-15 (×3): 40 mg via ORAL
  Filled 2021-09-13 (×3): qty 1

## 2021-09-13 MED ORDER — ENOXAPARIN SODIUM 100 MG/ML IJ SOSY
90.0000 mg | PREFILLED_SYRINGE | Freq: Two times a day (BID) | INTRAMUSCULAR | Status: DC
  Administered 2021-09-14: 90 mg via SUBCUTANEOUS
  Filled 2021-09-13: qty 1

## 2021-09-13 MED ORDER — AMLODIPINE BESYLATE 5 MG PO TABS: 5.0000 mg | ORAL_TABLET | Freq: Every day | ORAL | Status: DC

## 2021-09-13 MED ORDER — IPRATROPIUM-ALBUTEROL 0.5-2.5 (3) MG/3ML IN SOLN
3.0000 mL | Freq: Once | RESPIRATORY_TRACT | Status: AC
  Administered 2021-09-13: 3 mL via RESPIRATORY_TRACT
  Filled 2021-09-13: qty 3

## 2021-09-13 MED ORDER — IRBESARTAN 150 MG PO TABS: 300.0000 mg | ORAL_TABLET | Freq: Every day | ORAL | Status: DC

## 2021-09-13 MED ORDER — INSULIN GLARGINE-YFGN 100 UNIT/ML ~~LOC~~ SOLN
10.0000 [IU] | Freq: Two times a day (BID) | SUBCUTANEOUS | Status: DC
  Administered 2021-09-13 – 2021-09-14 (×4): 10 [IU] via SUBCUTANEOUS
  Filled 2021-09-13 (×12): qty 0.1

## 2021-09-13 MED ORDER — INSULIN ASPART 100 UNIT/ML IJ SOLN
0.0000 [IU] | Freq: Three times a day (TID) | INTRAMUSCULAR | Status: DC
  Administered 2021-09-13: 3 [IU] via SUBCUTANEOUS
  Administered 2021-09-13 (×2): 9 [IU] via SUBCUTANEOUS
  Administered 2021-09-14: 5 [IU] via SUBCUTANEOUS
  Administered 2021-09-14 (×2): 3 [IU] via SUBCUTANEOUS
  Administered 2021-09-15: 1 [IU] via SUBCUTANEOUS
  Administered 2021-09-15: 3 [IU] via SUBCUTANEOUS
  Filled 2021-09-13 (×2): qty 1

## 2021-09-13 MED ORDER — ROSUVASTATIN CALCIUM 20 MG PO TABS
20.0000 mg | ORAL_TABLET | Freq: Every day | ORAL | Status: DC
  Administered 2021-09-13 – 2021-09-15 (×3): 20 mg via ORAL
  Filled 2021-09-13 (×3): qty 1

## 2021-09-13 MED ORDER — FUROSEMIDE 10 MG/ML IJ SOLN
40.0000 mg | Freq: Once | INTRAMUSCULAR | Status: AC
  Administered 2021-09-13: 40 mg via INTRAVENOUS
  Filled 2021-09-13: qty 4

## 2021-09-13 MED ORDER — FUROSEMIDE 10 MG/ML IJ SOLN
40.0000 mg | Freq: Two times a day (BID) | INTRAMUSCULAR | Status: DC
  Administered 2021-09-13 (×2): 40 mg via INTRAVENOUS
  Filled 2021-09-13 (×2): qty 4

## 2021-09-13 MED ORDER — HEPARIN (PORCINE) 25000 UT/250ML-% IV SOLN
1000.0000 [IU]/h | INTRAVENOUS | Status: DC
  Administered 2021-09-13: 1000 [IU]/h via INTRAVENOUS
  Filled 2021-09-13 (×2): qty 250

## 2021-09-13 MED ORDER — CARVEDILOL 12.5 MG PO TABS
12.5000 mg | ORAL_TABLET | Freq: Two times a day (BID) | ORAL | Status: DC
  Administered 2021-09-13 – 2021-09-15 (×5): 12.5 mg via ORAL
  Filled 2021-09-13 (×5): qty 1

## 2021-09-13 MED ORDER — IPRATROPIUM-ALBUTEROL 0.5-2.5 (3) MG/3ML IN SOLN: 3.0000 mL | RESPIRATORY_TRACT | Status: DC | PRN

## 2021-09-13 MED ORDER — CHLORHEXIDINE GLUCONATE CLOTH 2 % EX PADS
6.0000 | MEDICATED_PAD | Freq: Every day | CUTANEOUS | Status: DC
  Administered 2021-09-14 – 2021-09-15 (×2): 6 via TOPICAL

## 2021-09-13 MED ORDER — ACETAMINOPHEN 325 MG PO TABS
650.0000 mg | ORAL_TABLET | Freq: Four times a day (QID) | ORAL | Status: DC | PRN
  Administered 2021-09-13: 650 mg via ORAL
  Filled 2021-09-13: qty 2

## 2021-09-13 MED ORDER — GABAPENTIN 300 MG PO CAPS
300.0000 mg | ORAL_CAPSULE | Freq: Three times a day (TID) | ORAL | Status: DC
  Administered 2021-09-13 – 2021-09-15 (×7): 300 mg via ORAL
  Filled 2021-09-13 (×7): qty 1

## 2021-09-13 MED ORDER — INSULIN ASPART 100 UNIT/ML IJ SOLN
0.0000 [IU] | Freq: Every day | INTRAMUSCULAR | Status: DC
  Administered 2021-09-13: 3 [IU] via SUBCUTANEOUS
  Administered 2021-09-14: 2 [IU] via SUBCUTANEOUS

## 2021-09-13 MED ORDER — TAMSULOSIN HCL 0.4 MG PO CAPS
0.4000 mg | ORAL_CAPSULE | Freq: Every evening | ORAL | Status: DC
  Administered 2021-09-13 – 2021-09-14 (×2): 0.4 mg via ORAL
  Filled 2021-09-13 (×2): qty 1

## 2021-09-13 MED ORDER — LIDOCAINE HCL URETHRAL/MUCOSAL 2 % EX GEL
CUTANEOUS | Status: AC
  Administered 2021-09-13: 1
  Filled 2021-09-13: qty 10

## 2021-09-13 MED ORDER — MAGNESIUM SULFATE 2 GM/50ML IV SOLN
2.0000 g | Freq: Once | INTRAVENOUS | Status: AC
  Administered 2021-09-13: 2 g via INTRAVENOUS
  Filled 2021-09-13: qty 50

## 2021-09-13 MED ORDER — CALCIUM CARBONATE ANTACID 500 MG PO CHEW
2.5000 | CHEWABLE_TABLET | Freq: Every day | ORAL | Status: DC
  Administered 2021-09-13 – 2021-09-15 (×3): 500 mg via ORAL
  Filled 2021-09-13 (×3): qty 3

## 2021-09-13 MED ORDER — ALUM & MAG HYDROXIDE-SIMETH 200-200-20 MG/5ML PO SUSP
30.0000 mL | ORAL | Status: DC | PRN
  Administered 2021-09-13: 30 mL via ORAL
  Filled 2021-09-13: qty 30

## 2021-09-13 MED ORDER — ENOXAPARIN SODIUM 40 MG/0.4ML IJ SOSY: 40.0000 mg | PREFILLED_SYRINGE | Freq: Every day | INTRAMUSCULAR | Status: DC

## 2021-09-13 MED ORDER — ENOXAPARIN SODIUM 100 MG/ML IJ SOSY
90.0000 mg | PREFILLED_SYRINGE | Freq: Once | INTRAMUSCULAR | Status: AC
Start: 2021-09-13 — End: 2021-09-13
  Administered 2021-09-13: 90 mg via SUBCUTANEOUS
  Filled 2021-09-13: qty 1

## 2021-09-13 MED ORDER — ASPIRIN EC 81 MG PO TBEC
81.0000 mg | DELAYED_RELEASE_TABLET | Freq: Every day | ORAL | Status: DC
  Administered 2021-09-13 – 2021-09-15 (×3): 81 mg via ORAL
  Filled 2021-09-13 (×3): qty 1

## 2021-09-13 MED ORDER — NITROGLYCERIN IN D5W 200-5 MCG/ML-% IV SOLN
5.0000 ug/min | INTRAVENOUS | Status: DC
Start: 2021-09-13 — End: 2021-09-15
  Administered 2021-09-13: 10 ug/min via INTRAVENOUS
  Filled 2021-09-13: qty 250

## 2021-09-13 MED ORDER — HEPARIN BOLUS VIA INFUSION
4000.0000 [IU] | Freq: Once | INTRAVENOUS | Status: AC
  Administered 2021-09-13: 4000 [IU] via INTRAVENOUS

## 2021-09-13 NOTE — ED Notes (Signed)
Resting in bed with eyes closed.  Resp unlabored while on bipap.  No acute distress noted at this time.  Nitro gtt titrated to 11mcg/min for bp as ordered

## 2021-09-13 NOTE — Progress Notes (Signed)
**Note De-Identified  Obfuscation** Patient removed from BIPAP and placed on [place] tolerating well. Patient alert and eating breakfast.RRT to continue to monitor

## 2021-09-13 NOTE — Progress Notes (Signed)
John [name] is an 80 y.o. obese male with medical history significant for COPD, type 2 diabetes mellitus, OSA on CPAP, diabetic polyneuropathy, chronic diastolic heart failure, essential hypertension, CAD, PVD who presents to the emergency department due to shortness of breath.  He has been admitted with acute hypoxemic and hypercarbic respiratory failure in the setting of acute on chronic diastolic congestive heart failure.  He is also noted to have troponin elevation in the setting of CAD.  He has been seen by cardiology with recommendations to continue IV diuresis as well as initiate IV heparin with plans for conservative management.  2D echocardiogram ordered and pending.  Acute hypoxemic/hypercarbic respiratory failure secondary to acute on chronic diastolic heart failure -Wean BiPAP as tolerated -Repeat 2D echocardiogram pending with prior 12/2020 with LVEF 50-55% and grade 1 diastolic dysfunction -Continue IV diuresis -Strict I's and O's and daily weights  Elevated troponin in the setting of CAD/PVD -Likely in the setting of CKD and heart failure and likely demand ischemia -Repeat 2D echocardiogram pending -Appreciate cardiology recommendations with recommendations for heparin drip for 48 hours as well as aspirin, Coreg, and Crestor -Continue to monitor  History of COPD -DuoNebs as needed  Hypertensive emergency -Currently on nitroglycerin drip which is being weaned  CKD 3B/4 -Creatinine at baseline 1.8-2.3 -Avoid nephrotoxic agents and continue to monitor with aggressive diuresis  Hyperglycemia secondary to type 2 diabetes and recent steroid use -Continue SSI and Semglee 10 units twice daily -On Neurontin for associated diabetic neuropathy  Obstructive sleep apnea -Noncompliant with home CPAP  BPH with urinary retention -Continue Flomax and will need Foley catheter placement -Urology follow-up outpatient  Obesity -Lifestyle changes outpatient  Hard of  hearing  Discussed with daughter at bedside.  Total care time: 40 minutes.

## 2021-09-13 NOTE — ED Triage Notes (Signed)
Rcems for home with cc of SOB that started tonight.  18g r fa. Gave 125 solumedrol and 2 neb treatments. Per report pt was hypertensive 240/110. Reportedly pt was low 80s on room air. He was 91% on NRB. Pt has a distended abdomen at baseline. He is alert and oriented able to answer questions.

## 2021-09-13 NOTE — ED Notes (Addendum)
Pt received lasix earlier tonight and this RN noticed pt has not produced any urine. This RN scanned patient bladder and highest number in bladder was 384. Pt states that he usually has a lot in his bladder before he uses the bathroom. Pt states he does not feel like he needs at urinate right now and that he hasn't had a problem urinating at home. Asked pt to try to use urinal if able. Hospitalist made aware of this and will continue to monitor.

## 2021-09-13 NOTE — Progress Notes (Signed)
ANTICOAGULATION CONSULT NOTE - Initial Consult  Pharmacy Consult for Heparin Indication: chest pain/ACS  Allergies  Allergen Reactions   Penicillins Hives   Ticagrelor Shortness Of Breath   Ezetimibe-Simvastatin Other (See Comments)    Myalgia     Patient Measurements: Height: 5\' 7"  (170.2 cm) Weight: 89 kg (196 lb 3.4 oz) IBW/kg (Calculated) : 66.1 HEPARIN DW (KG): 84.5   Vital Signs: Temp: 98.7 F (37.1 C) (11/23 0230) Temp Source: Oral (11/23 0230) BP: 148/83 (11/23 1330) Pulse Rate: 80 (11/23 1330)  Labs: Recent Labs    09/13/21 0222 09/13/21 0359 09/13/21 0804 09/13/21 0938  HGB 14.7  --   --   --   HCT 45.8  --   --   --   PLT 162  --   --   --   CREATININE 1.98*  --   --   --   TROPONINIHS 34* 82* 489* 484*     Estimated Creatinine Clearance: 30.6 mL/min (A) (by C-G formula based on SCr of 1.98 mg/dL (H)).   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 Reviewed home meds and not on oral anticoagulation. Pharmacy asked to start heparin.  Plan to manage conservatively in regard to the elevated troponin. D/w Dr. Manuella Ghazi, ok to use lovenox instead of heparin.   CrCl~36 ml/min  Goal of Therapy:  Anti-Xa 0.6-1 Monitor platelets by anticoagulation protocol: Yes   Plan:  Dc heparin Lovenox 90mg  SQ BID F/u CBC  Onnie Boer, PharmD, BCIDP, AAHIVP, CPP Infectious Disease Pharmacist 09/13/2021 1:49 PM

## 2021-09-13 NOTE — H&P (Addendum)
History and Physical  John [name] KWI:[id number] DOB: 02/28/1941 DOA: 09/13/2021  Referring physician: Ezequiel Essex, MD  PCP: [name], DO  Patient coming from: Home  Chief Complaint: Shortness of breath  HPI: John [name] is an 80 y.o. obese male with medical history significant for COPD, type 2 diabetes mellitus, OSA on CPAP, diabetic polyneuropathy, chronic diastolic heart failure, essential hypertension, CAD, PVD who presents to the emergency department due to shortness of breath.  Patient states that he woke up suddenly from sleep with severe shortness of breath, EMS was activated and on arrival of EMS team, he was noted to be hypoxic with O2 sat in low 80s on room air, NRB was placed with improved O2 sat to 91%.  BP was noted to be 240/110.  2 neb treatments were given, Solu-Medrol 25 mg x 1 was given.  Patient as noted increased shortness of breath yesterday morning, he endorsed having missed some of his medications within last few days.  He denies chest pain, fever, chills, cough, leg pain or swelling.  ED Course:  In the emergency department, vital signs were within normal range, but patient was transitioned to BiPAP with FiO2 of 50% and O2 sat improved to 96-100%.  ABG showed respiratory acidosis with hypercarbia and hypoxia.  Work-up in the ED showed leukocytosis, hyperglycemia, BUN/creatinine 30/1.98, BNP 596 (chronically elevated-495 about 8 months ago).  Troponin x2 -34 > 82.  Influenza A, B, SARS coronavirus 2 was negative. Chest x-ray showed mild changes of CHF IV Lasix 40 mg x 1 was given, breathing treatment was provided.  Magnesium was given.  Hospitalist was asked to admit patient for further evaluation and management.  Review of Systems: Constitutional: Negative for chills and fever.  HENT: Negative for ear pain and sore throat.   Eyes: Negative for pain and visual disturbance.  Respiratory: Positive for shortness of breath.  Negative for cough, chest tightness   Cardiovascular: Negative for chest pain and palpitations.  Gastrointestinal: Negative for abdominal pain and vomiting.  Endocrine: Negative for polyphagia and polyuria.  Genitourinary: Negative for decreased urine volume, dysuria, enuresis Musculoskeletal: Negative for arthralgias and back pain.  Skin: Negative for color change and rash.  Allergic/Immunologic: Negative for immunocompromised state.  Neurological: Negative for tremors, syncope, speech difficulty, weakness, light-headedness and headaches.  Hematological: Does not bruise/bleed easily.  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)    BPH (benign prostatic hyperplasia)    CAD (coronary artery disease)    Cholesteatoma of attic    Depression    Diabetes mellitus ([place])    Ear disease    GERD (gastroesophageal reflux disease)    Hyperlipidemia    Hypertension    PVD (peripheral vascular disease) ([place])    Stroke [facility])    Past Surgical History:  Procedure Laterality Date   BONE ANCHORED HEARING AID IMPLANT Left 06/25/2013   Procedure: BONE ANCHORED HEARING AID (BAHA) IMPLANT;  Surgeon: Fannie Knee, MD;  Location: [place];  Service: ENT;  Laterality: Left;   CAROTID STENT INSERTION Left    COCHLEAR IMPLANT     CORONARY STENT PLACEMENT     MASS EXCISION Left 06/25/2013   Procedure: EXCISION LEFT TEMPORAL MASS;  Surgeon: Fannie Knee, MD;  Location: [place];  Service: ENT;  Laterality: Left;   TONSILLECTOMY      Social History:  reports that he quit smoking about 32  years ago. His smoking use included cigarettes. He has never used smokeless tobacco. He reports that he does not currently use alcohol. He reports that he does not use drugs.   Allergies  Allergen Reactions   Penicillins Hives   Ticagrelor Shortness Of Breath   Ezetimibe-Simvastatin Other (See Comments)    Myalgia     Family History  Problem  Relation Age of Onset   Diabetes Other    Diabetes Mother    Diabetes Maternal Aunt    Depression Other    Depression Other    Drug abuse Daughter    Diabetes Daughter      Prior to Admission medications   Medication Sig Start Date End Date Taking? Authorizing Provider  albuterol (VENTOLIN HFA) 108 (90 Base) MCG/ACT inhaler Inhale 2 puffs into the lungs every 6 (six) hours as needed for wheezing or shortness of breath. 12/20/20   Pokhrel, Corrie Mckusick, MD  allopurinol (ZYLOPRIM) 300 MG tablet Take 300 mg by mouth daily. 06/21/12   [provider]  amLODipine (NORVASC) 5 MG tablet Take 1 tablet (5 mg total) by mouth daily. 07/13/21   Lelon Perla, MD  aspirin 81 MG tablet Take 81 mg by mouth daily. Take 1 tab daily    [provider]  canagliflozin (INVOKANA) 100 MG TABS tablet Take 100 mg by mouth daily. 04/28/20   [provider]  candesartan (ATACAND) 32 MG tablet Take 32 mg by mouth daily. 03/01/20   [provider]  carvedilol (COREG) 12.5 MG tablet Take 1 tablet (12.5 mg total) by mouth 2 (two) times daily with a meal. 05/05/21   Crenshaw, Denice Bors, MD  Cyanocobalamin (VITAMIN B 12 PO) Take 1 tablet by mouth daily.    [provider]  furosemide (LASIX) 20 MG tablet Take 1 tablet (20 mg total) by mouth every other day. May take extra tablet as needed for 2-3 lb weight gain 05/08/21 05/08/22  Lelon Perla, MD  gabapentin (NEURONTIN) 300 MG capsule Take 300 mg by mouth 3 (three) times daily. 08/04/19   [provider]  glimepiride (AMARYL) 4 MG tablet Take 4 mg by mouth daily. 05/02/15   [provider]  insulin glargine (LANTUS SOLOSTAR) 100 UNIT/ML Solostar Pen Inject 32 Units into the skin 2 (two) times daily. 10/01/19   [provider]  pantoprazole (PROTONIX) 40 MG tablet Take 40 mg by mouth daily. 08/19/14   [provider]  rosuvastatin (CRESTOR) 20 MG tablet TAKE 1 TABLET BY MOUTH EVERY DAY IN THE EVENING  09/11/21   Lelon Perla, MD  senna (SENOKOT) 8.6 MG tablet Take 1 tablet by mouth as needed for constipation.    [provider]  sertraline (ZOLOFT) 25 MG tablet Take 1 tablet (25 mg total) by mouth every evening. 01/09/21   Thayer Headings, PMHNP  tamsulosin (FLOMAX) 0.4 MG CAPS capsule Take 0.4 mg by mouth every evening. 09/22/14   [provider]    Physical Exam: BP 126/67   Pulse 69   Temp 98.7 F (37.1 C) (Oral)   Resp 16   Ht 5\' 7"  (1.702 m)   Wt 89 kg   SpO2 96%   BMI 30.73 kg/m   General: 80 y.o. year-old obese male well developed well nourished on BiPAP and in no acute distress.  Alert and oriented x3. HEENT: NCAT, EOMI Neck: Supple, trachea medial Cardiovascular: Regular rate and rhythm with no rubs or gallops.  No thyromegaly noted.  No lower extremity edema.  2/4 pulses in all 4 extremities. Respiratory: Bilateral Rales in the lower lobes.  No wheezes.   Abdomen: Soft, nontender but distended (chronic and baseline). Normal bowel sounds x4 quadrants. Muskuloskeletal: No cyanosis, clubbing or edema noted bilaterally Neuro: CN II-XII intact, strength 5/5 x 4, sensation, reflexes intact Skin: No ulcerative lesions noted or rashes Psychiatry: Judgement and insight appear normal. Mood is appropriate for condition and setting          Labs on Admission:  Basic Metabolic Panel: Recent Labs  Lab 09/13/21 0222  NA 135  K 4.8  CL 102  CO2 23  GLUCOSE 174*  BUN 30*  CREATININE 1.98*  CALCIUM 8.8*   Liver Function Tests: Recent Labs  Lab 09/13/21 0222  AST 17  ALT 12  ALKPHOS 67  BILITOT 1.2  PROT 7.6  ALBUMIN 4.0   No results for input(s): LIPASE, AMYLASE in the last 168 hours. No results for input(s): AMMONIA in the last 168 hours. CBC: Recent Labs  Lab 09/13/21 0222  WBC 15.5*  NEUTROABS 11.9*  HGB 14.7  HCT 45.8  MCV 91.6  PLT 162   Cardiac Enzymes: No results for input(s): CKTOTAL, CKMB, CKMBINDEX, TROPONINI in the last  168 hours.  BNP (last 3 results) Recent Labs    12/19/20 1243 09/13/21 0222  BNP 495.2* 596.0*    ProBNP (last 3 results) No results for input(s): PROBNP in the last 8760 hours.  CBG: No results for input(s): GLUCAP in the last 168 hours.  Radiological Exams on Admission: DG Chest Portable 1 View  Result Date: 09/13/2021 CLINICAL DATA:  Shortness of breath EXAM: PORTABLE CHEST 1 VIEW COMPARISON:  12/20/2020 FINDINGS: Cardiac shadow is stable. Aortic calcifications are noted. Increased vascular congestion is noted with mild interstitial edema consistent with CHF. No sizable effusion is noted. IMPRESSION: Mild changes of CHF. Electronically Signed   By: Inez Catalina M.D.   On: 09/13/2021 02:50    EKG: I independently viewed the EKG done and my findings are as followed: Sinus tachycardia at a rate of 102 bpm with incomplete LBBB  Assessment/Plan Present on Admission:  CAD (coronary artery disease) of artery bypass graft  Essential hypertension  Peripheral vascular disease ([place])  Principal Problem:   Acute exacerbation of CHF (congestive heart failure) ([place]) Active Problems:   CAD (coronary artery disease) of artery bypass graft   Peripheral vascular disease ([place])   Essential hypertension   Acute respiratory failure with hypoxia and hypercapnia (HCC)   Leukocytosis   Hyperglycemia due to diabetes mellitus (HCC)   Hypocalcemia   Elevated troponin   Obstructive sleep apnea   BPH (benign prostatic hyperplasia)   CKD (chronic kidney disease), stage III (HCC)   Acute respiratory failure with hypoxia and hypercarbia requiring NIPPV possibly due to acute exacerbation of CHF Chest x-ray was suggestive of CHF BNP was elevated at 596 (thought this was chronically elevated) Patient states that he has not been compliant with his COPD and CHF medication within the last few days Continue total input/output, daily weights and fluid restriction IV Lasix 40 mg x 1 was given in the ED.  Continue IV Lasix 40 twice daily Continue Cardiac diet  EKG reviewed showed sinus tachycardia at a rate of 102 bpm with incomplete LBBB Echocardiogram done in March 2022 showed LVEF of 50 to 55%, no  RWMA.  LV diastolic parameters was consistent with G1 DD.  Echocardiogram will will be done in the morning   COPD Continue DuoNebs as needed  ??  Hypertensive emergency This resolved prior to arrival to the ED  Elevated troponin possibly secondary to type II demand ischemia Troponin x2 -34 > 82, patient denied chest pain, continue to trend troponin  Hyperglycemia secondary to type II DM and steroid Continue ISS and hypoglycemic protocol Continue Semglee 10 units twice daily (home dose-Lantus 32 units twice daily) and titrate accordingly  Leukocytosis possible secondary to steroid WBC 15.5, continue to monitor WBC with morning labs  Hypocalcemia Continue Os-Cal  CAD/PVD Continue aspirin, Crestor, Coreg  Essential hypertension Continue Lasix, Coreg (monitor BP tolerance closely)  Obstructive sleep apnea Continue CPAP  CKD stage IIIB BUN/creatinine 30/1.98 (creatinine within baseline range) Renally adjust medications, avoid nephrotoxic agents/dehydration/hypotension  Diabetic neuropathy Continue Neurontin  BPH Continue Flomax  Obesity(BMI 30.73 kg/m) Patient was counseled about the cardiovascular and metabolic risk of morbid obesity. Patient was counseled for diet control, exercise regimen and weight loss.     DVT prophylaxis: Lovenox  Code Status: Full code  Family Communication: None at bedside  Disposition Plan:  Patient is from:                        home Anticipated DC to:                   SNF or family members home Anticipated DC date:               2-3 days Anticipated DC barriers:          Patient requires inpatient management due to respiratory failure with hypoxia and hypercapnia requiring BiPAP in the setting of CHF exacerbation   Consults called:  Cardiology  Admission status: Inpatient    Bernadette Hoit MD [facility]  09/13/2021, 7:40 AM

## 2021-09-13 NOTE — ED Provider Notes (Signed)
[facility] EMERGENCY DEPARTMENT Provider Note   CSN: [id number] Arrival date & time: 09/13/21  0205     History Chief Complaint  Patient presents with   Shortness of Breath    John [name] is a 80 y.o. male.  Level 5 caveat of respiratory distress.  Patient brought in by EMS on nonrebreather with hypoxia and respiratory distress is noted this evening woke up from sleep.  States he felt well when he went to bed.  Woke up hypertensive to 240/110 O2 saturation about 80%.  He was placed on oxygen by fire department and a nonrebreather when his O2 saturation did not improve.  EMS gave him albuterol x2 as well as Solu-Medrol.  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  EMS reported he was "going in and out of it" but did not lose consciousness and he is alert on arrival.  He denies chest pain, cough or fever.  No leg pain or leg swelling.  He does have a distended abdomen but it is unchanged.  He has never had this problem before.   The history is provided by the patient and the EMS personnel. The history is limited by the condition of the patient.  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)    BPH (benign prostatic hyperplasia)    CAD (coronary artery disease)    Cholesteatoma of attic    Depression    Diabetes mellitus ([place])    Ear disease    GERD (gastroesophageal reflux disease)    Hyperlipidemia    Hypertension    PVD (peripheral vascular disease) ([place])    Stroke [facility])     Patient Active Problem List   Diagnosis Date Noted   Chest pain 18/56/3149   Acute diastolic CHF (congestive heart failure) ([place]) 12/19/2020   CHF (congestive heart failure) ([place]) 12/19/2020   Essential hypertension 10/03/2012   CAD (coronary artery disease) of artery bypass graft 06/26/2012   Cardiomyopathy, 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   Essential hypertension, malignant 06/26/2012    Hyperlipidemia 06/26/2012   Preop cardiovascular exam 06/26/2012   Peripheral vascular disease ([place]) 06/26/2012   Coronary atherosclerosis of native coronary artery 06/26/2012    Past Surgical History:  Procedure Laterality Date   BONE ANCHORED HEARING AID IMPLANT Left 06/25/2013   Procedure: BONE ANCHORED HEARING AID (BAHA) IMPLANT;  Surgeon: Fannie Knee, MD;  Location: [place];  Service: ENT;  Laterality: Left;   CAROTID STENT INSERTION Left    COCHLEAR IMPLANT     CORONARY STENT PLACEMENT     MASS EXCISION Left 06/25/2013   Procedure: EXCISION LEFT TEMPORAL MASS;  Surgeon: Fannie Knee, MD;  Location: [place];  Service: ENT;  Laterality: Left;   TONSILLECTOMY         Family History  Problem Relation Age of Onset   Diabetes Other    Diabetes Mother    Diabetes Maternal Aunt    Depression Other    Depression Other    Drug abuse Daughter    Diabetes Daughter     Social History   Tobacco Use   Smoking status: Former    Types: Cigarettes    Quit date: 05/19/1989    Years since quitting: 32.3   Smokeless tobacco: Never  Substance Use Topics   Alcohol use: Not Currently   Drug use: No  Home Medications Prior to Admission medications   Medication Sig Start Date End Date Taking? Authorizing Provider  albuterol (VENTOLIN HFA) 108 (90 Base) MCG/ACT inhaler Inhale 2 puffs into the lungs every 6 (six) hours as needed for wheezing or shortness of breath. 12/20/20   Pokhrel, Corrie Mckusick, MD  allopurinol (ZYLOPRIM) 300 MG tablet Take 300 mg by mouth daily. 06/21/12   [provider]  amLODipine (NORVASC) 5 MG tablet Take 1 tablet (5 mg total) by mouth daily. 07/13/21   Lelon Perla, MD  aspirin 81 MG tablet Take 81 mg by mouth daily. Take 1 tab daily    [provider]  canagliflozin (INVOKANA) 100 MG TABS tablet Take 100 mg by mouth daily. 04/28/20   [provider]  candesartan (ATACAND) 32 MG tablet Take 32 mg by mouth  daily. 03/01/20   [provider]  carvedilol (COREG) 12.5 MG tablet Take 1 tablet (12.5 mg total) by mouth 2 (two) times daily with a meal. 05/05/21   Crenshaw, Denice Bors, MD  Cyanocobalamin (VITAMIN B 12 PO) Take 1 tablet by mouth daily.    [provider]  furosemide (LASIX) 20 MG tablet Take 1 tablet (20 mg total) by mouth every other day. May take extra tablet as needed for 2-3 lb weight gain 05/08/21 05/08/22  Lelon Perla, MD  gabapentin (NEURONTIN) 300 MG capsule Take 300 mg by mouth 3 (three) times daily. 08/04/19   [provider]  glimepiride (AMARYL) 4 MG tablet Take 4 mg by mouth daily. 05/02/15   [provider]  insulin glargine (LANTUS SOLOSTAR) 100 UNIT/ML Solostar Pen Inject 32 Units into the skin 2 (two) times daily. 10/01/19   [provider]  pantoprazole (PROTONIX) 40 MG tablet Take 40 mg by mouth daily. 08/19/14   [provider]  rosuvastatin (CRESTOR) 20 MG tablet TAKE 1 TABLET BY MOUTH EVERY DAY IN THE EVENING 09/11/21   Lelon Perla, MD  senna (SENOKOT) 8.6 MG tablet Take 1 tablet by mouth as needed for constipation.    [provider]  sertraline (ZOLOFT) 25 MG tablet Take 1 tablet (25 mg total) by mouth every evening. 01/09/21   Thayer Headings, PMHNP  tamsulosin (FLOMAX) 0.4 MG CAPS capsule Take 0.4 mg by mouth every evening. 09/22/14   [provider]    Allergies    Penicillins, Ticagrelor, and Ezetimibe-simvastatin  Review of Systems   Review of Systems  Unable to perform ROS: Severe respiratory distress  Respiratory:  Positive for shortness of breath.    Physical Exam Updated Vital Signs BP (!) 186/81   Pulse 98   Resp (!) 32   Ht 5\' 7"  (1.702 m)   Wt 89 kg   SpO2 94%   BMI 30.73 kg/m   Physical Exam Vitals and nursing note reviewed.  Constitutional:      General: He is in acute distress.     Appearance: He is ill-appearing.     Comments: Moderate respiratory distress,  speaking in short phrases  HENT:     Head: Normocephalic and atraumatic.     Mouth/Throat:     Pharynx: No oropharyngeal exudate.  Eyes:     Conjunctiva/sclera: Conjunctivae normal.     Pupils: Pupils are equal, round, and reactive to light.  Neck:     Comments: No meningismus. Cardiovascular:     Rate and Rhythm: Normal rate and regular rhythm.     Heart sounds: Normal heart sounds. No murmur heard. Pulmonary:  Effort: Respiratory distress present.     Breath sounds: Wheezing and rales present.     Comments: Tachypnea to the 30s, basilar crackles, expiratory wheezing bilaterally Abdominal:     General: There is distension.     Palpations: Abdomen is soft.     Tenderness: There is no abdominal tenderness. There is no guarding or rebound.  Musculoskeletal:        General: No tenderness. Normal range of motion.     Cervical back: Normal range of motion and neck supple.     Right lower leg: No edema.     Left lower leg: No edema.  Skin:    General: Skin is warm.  Neurological:     Mental Status: He is oriented to person, place, and time.     Cranial Nerves: No cranial nerve deficit.     Motor: No abnormal muscle tone.     Coordination: Coordination normal.     Comments:  5/5 strength throughout. CN 2-12 intact.Equal grip strength.   Psychiatric:        Behavior: Behavior normal.    ED Results / Procedures / Treatments   Labs (all labs ordered are listed, but only abnormal results are displayed) Labs Reviewed  BLOOD GAS, ARTERIAL - Abnormal; Notable for the following components:      Result Value   pH, Arterial 7.276 (*)    pCO2 arterial 48.2 (*)    pO2, Arterial 74.3 (*)    Acid-base deficit 4.1 (*)    All other components within normal limits  CBC WITH DIFFERENTIAL/PLATELET - Abnormal; Notable for the following components:   WBC 15.5 (*)    Neutro Abs 11.9 (*)    Eosinophils Absolute 0.6 (*)    Abs Immature Granulocytes 0.10 (*)    All other components within  normal limits  COMPREHENSIVE METABOLIC PANEL - Abnormal; Notable for the following components:   Glucose, Bld 174 (*)    BUN 30 (*)    Creatinine, Ser 1.98 (*)    Calcium 8.8 (*)    GFR, Estimated 33 (*)    All other components within normal limits  BRAIN NATRIURETIC PEPTIDE - Abnormal; Notable for the following components:   B Natriuretic Peptide 596.0 (*)    All other components within normal limits  TROPONIN I (HIGH SENSITIVITY) - Abnormal; Notable for the following components:   Troponin I (High Sensitivity) 34 (*)    All other components within normal limits  TROPONIN I (HIGH SENSITIVITY) - Abnormal; Notable for the following components:   Troponin I (High Sensitivity) 82 (*)    All other components within normal limits  RESP PANEL BY RT-PCR (FLU A&B, COVID) [id number]    EKG EKG Interpretation  Date/Time:  Wednesday September 13 2021 02:11:29 EST Ventricular Rate:  102 PR Interval:  34 QRS Duration: 112 QT Interval:  368 QTC Calculation: 480 R Axis:   90 Text Interpretation: Sinus tachycardia Incomplete left bundle branch block Borderline prolonged QT interval Rate faster Confirmed by Ezequiel Essex [phone]) on 09/13/2021 2:18:05 AM  Radiology DG Chest Portable 1 View  Result Date: 09/13/2021 CLINICAL DATA:  Shortness of breath EXAM: PORTABLE CHEST 1 VIEW COMPARISON:  12/20/2020 FINDINGS: Cardiac shadow is stable. Aortic calcifications are noted. Increased vascular congestion is noted with mild interstitial edema consistent with CHF. No sizable effusion is noted. IMPRESSION: Mild changes of CHF. Electronically Signed   By: Inez Catalina M.D.   On: 09/13/2021 02:50    Procedures .Critical Care Performed by: Pantera Winterrowd,  Annie Main, MD Authorized by: Ezequiel Essex, MD   Critical care provider statement:    Critical care time (minutes):  45   Critical care was necessary to treat or prevent imminent or life-threatening deterioration of the following conditions:  Respiratory  failure   Critical care was time spent personally by me on the following activities:  Development of treatment plan with patient or surrogate, discussions with consultants, evaluation of patient's response to treatment, examination of patient, ordering and review of laboratory studies, ordering and review of radiographic studies, ordering and performing treatments and interventions, pulse oximetry, re-evaluation of patient's condition and review of old charts   I assumed direction of critical care for this patient from another provider in my specialty: yes     Care discussed with: admitting provider     Medications Ordered in ED Medications  furosemide (LASIX) injection 40 mg (has no administration in time range)  ipratropium-albuterol (DUONEB) 0.5-2.5 (3) MG/3ML nebulizer solution 3 mL (has no administration in time range)  magnesium sulfate IVPB 2 g 50 mL (has no administration in time range)  nitroGLYCERIN 50 mg in dextrose 5 % 250 mL (0.2 mg/mL) infusion (has no administration in time range)    ED Course  I have reviewed the triage vital signs and the nursing notes.  Pertinent labs & imaging results that were available during my care of the patient were reviewed by me and considered in my medical decision making (see chart for details).    MDM Rules/Calculators/A&P                          Respiratory distress with likely combination of COPD as well as CHF.  EKG shows no acute ischemia.  Received bronchodilators, steroids.  He will be given magnesium as well as IV Lasix and initiate nitroglycerin drip given his severe hypertension.  Echocardiogram showed normal ejection fraction with diastolic dysfunction.  Transition to BiPAP on arrival.  ABG shows respiratory acidosis.  He is treated for both COPD and CHF. Creatinine near baseline.   Patient improved on BiPAP.  He is mentating well.  He is given a dose of IV Lasix as well as medications for COPD exacerbation.  He denies chest  pain.  Troponin mildly elevated which is likely secondary to CHF exacerbation.  Low suspicion for ACS.  Admission discussed with Dr. Josephine Cables.  Final Clinical Impression(s) / ED Diagnoses Final diagnoses:  Acute respiratory failure with hypoxia ([place])  Acute on chronic congestive heart failure, unspecified heart failure type [facility])    Rx / DC Orders ED Discharge Orders     None        Flecia Shutter, Annie Main, MD 09/13/21 [phone]

## 2021-09-13 NOTE — Progress Notes (Signed)
ANTICOAGULATION CONSULT NOTE - Initial Consult  Pharmacy Consult for Heparin Indication: chest pain/ACS  Allergies  Allergen Reactions   Penicillins Hives   Ticagrelor Shortness Of Breath   Ezetimibe-Simvastatin Other (See Comments)    Myalgia     Patient Measurements: Height: 5\' 7"  (170.2 cm) Weight: 89 kg (196 lb 3.4 oz) IBW/kg (Calculated) : 66.1 HEPARIN DW (KG): 84.5   Vital Signs: Temp: 98.7 F (37.1 C) (11/23 0230) Temp Source: Oral (11/23 0230) BP: 112/82 (11/23 0740) Pulse Rate: 72 (11/23 0740)  Labs: Recent Labs    09/13/21 0222 09/13/21 0359 09/13/21 0804  HGB 14.7  --   --   HCT 45.8  --   --   PLT 162  --   --   CREATININE 1.98*  --   --   TROPONINIHS 34* 82* 489*    Estimated Creatinine Clearance: 30.6 mL/min (A) (by C-G formula based on SCr of 1.98 mg/dL (H)).   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 Reviewed home meds and not on oral anticoagulation. Pharmacy asked to start heparin  Goal of Therapy:  Heparin level 0.3-0.7 units/ml Monitor platelets by anticoagulation protocol: Yes   Plan:  Give 4000 units bolus x 1 Start heparin infusion at 1000 units/hr Check anti-Xa level in ~8 hours and daily while on heparin Continue to monitor H&H and platelets  Isac Sarna, BS Vena Austria, BCPS Clinical Pharmacist Pager [phone]  09/13/2021,9:47 AM

## 2021-09-13 NOTE — Consult Note (Signed)
Cardiology Consultation:   Patient ID: John [name] MRN: [id number]; DOB: [birth year]  Admit date: 09/13/2021 Date of Consult: 09/13/2021  PCP:  [name], [place] [facility] Cardiologist:  Kirk Ruths, MD        Patient Profile:   John [name] is a 80 y.o. male with a hx of CAD, PAD, chronic diasotlic HF who is being seen 09/13/2021 for the evaluation of SOB at the request of Dr Manuella Ghazi.  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 Cardiac catheterization in June of 2013 in [place] showed EF 40% and there was hypokinesis of the anterior wall. There was a 30-40% mid right coronary artery and a 30-40% mid PDA. The left main was normal. There was an 80% proximal LAD and a 70-80% mid lesion. The circumflex had a 90% lesion after the origin of a first marginal but continued mainly as a moderate size atrial John [name]. The patient had PCI of his LAD with 2 Ion drug-eluting stents. History of PAD followed at [facility], history of chronic diasotlic HF presents with SOB.   He reports progressing SOB and abdominal distension over the last week. Denies any chest pain. Reports last night after walking back from bathroom to bed severe SOB that would not resolved, called EMS. From admit note on EMS evaluation sats in 80%, bp 240/110. 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)    BPH (benign prostatic hyperplasia)    CAD (coronary artery disease)    Cholesteatoma of attic    Depression    Diabetes mellitus (HCC)    Ear disease    GERD (gastroesophageal reflux disease)    Hyperlipidemia    Hypertension    PVD  (peripheral vascular disease) ([place])    Stroke [facility])     Past Surgical History:  Procedure Laterality Date   BONE ANCHORED HEARING AID IMPLANT Left 06/25/2013   Procedure: BONE ANCHORED HEARING AID (BAHA) IMPLANT;  Surgeon: Fannie Knee, MD;  Location: [place];  Service: ENT;  Laterality: Left;   CAROTID STENT INSERTION Left    COCHLEAR IMPLANT     CORONARY STENT PLACEMENT     MASS EXCISION Left 06/25/2013   Procedure: EXCISION LEFT TEMPORAL MASS;  Surgeon: Fannie Knee, MD;  Location: [place];  Service: ENT;  Laterality: Left;   TONSILLECTOMY        Inpatient Medications: Scheduled Meds:  aspirin EC  81 mg Oral Daily   calcium carbonate  2.5 tablet Oral Q breakfast   carvedilol  12.5 mg Oral BID WC   enoxaparin (LOVENOX) injection  40 mg Subcutaneous Daily   furosemide  40 mg Intravenous Q12H   gabapentin  300 mg Oral TID   insulin aspart  0-5 Units Subcutaneous QHS   insulin aspart  0-9 Units Subcutaneous TID WC   insulin glargine-yfgn  10 Units Subcutaneous BID   pantoprazole  40 mg Oral Daily   rosuvastatin  20 mg Oral Daily   tamsulosin  0.4 mg Oral QPM   Continuous Infusions:  nitroGLYCERIN  15 mcg/min (09/13/21 0707)   PRN Meds: ipratropium-albuterol  Allergies:    Allergies  Allergen Reactions   Penicillins Hives   Ticagrelor Shortness Of Breath   Ezetimibe-Simvastatin Other (See Comments)    Myalgia     Social History:   Social History   Socioeconomic History   Marital status: Widowed    Spouse name: Not on file   Number of children: 3   Years of education: Not on file   Highest education level: Not on file  Occupational History   Not on file  Tobacco Use   Smoking status: Former    Types: Cigarettes    Quit date: 05/19/1989    Years since quitting: 32.3   Smokeless tobacco: Never  Substance and Sexual Activity   Alcohol use: Not Currently   Drug use: No   Sexual activity: Not on file  Other Topics Concern    Not on file  Social History Narrative   Not on file   Social Determinants of Health   Financial Resource Strain: Not on file  Food Insecurity: Not on file  Transportation Needs: Not on file  Physical Activity: Not on file  Stress: Not on file  Social Connections: Not on file  Intimate Partner Violence: Not on file    Family History:    Family History  Problem Relation Age of Onset   Diabetes Other    Diabetes Mother    Diabetes Maternal Aunt    Depression Other    Depression Other    Drug abuse Daughter    Diabetes Daughter      ROS:  Please see the history of present illness.   All other ROS reviewed and negative.     Physical Exam/Data:   Vitals:   09/13/21 0645 09/13/21 0700 09/13/21 0705 09/13/21 0740  BP: (!) 148/92 126/67  112/82  Pulse: 73 68 69 72  Resp: 15 15 16 12   Temp:      TempSrc:      SpO2: 97% 96% 96% 100%  Weight:      Height:        Intake/Output Summary (Last 24 hours) at 09/13/2021 0918 Last data filed at 09/13/2021 0326 Gross per 24 hour  Intake 45.42 ml  Output --  Net 45.42 ml   Last 3 Weights 09/13/2021 05/05/2021 01/16/2021  Weight (lbs) 196 lb 3.4 oz 194 lb 9.6 oz 202 lb 6.4 oz  Weight (kg) 89 kg 88.27 kg 91.808 kg     Body mass index is 30.73 kg/m.  General:  Well nourished, well developed, in no acute distress HEENT: normal Neck: no JVD Vascular: No carotid bruits; Distal pulses 2+ bilaterally Cardiac:  normal S1, S2; RRR; no murmur  Lungs:  bilateral crackles Abd: soft, nontender, no hepatomegaly  Ext: no edema Musculoskeletal:  No deformities, BUE and BLE strength normal and equal Skin: warm and dry  Neuro:  CNs 2-12 intact, no focal abnormalities noted Psych:  Normal affect     Laboratory Data:  High Sensitivity Troponin:   Recent Labs  Lab 09/13/21 0222 09/13/21 0359 09/13/21 0804  TROPONINIHS 34* 82* 489*     Chemistry Recent Labs  Lab 09/13/21 0222  NA 135  K 4.8  CL 102  CO2 23  GLUCOSE 174*   BUN 30*  CREATININE 1.98*  CALCIUM 8.8*  GFRNONAA 33*  ANIONGAP 10    Recent Labs  Lab 09/13/21 0222  PROT 7.6  ALBUMIN 4.0  AST 17  ALT 12  ALKPHOS 3  BILITOT 1.2   Lipids No results for input(s): CHOL, TRIG, HDL, LABVLDL, LDLCALC, CHOLHDL in the last 168 hours.  Hematology Recent Labs  Lab 09/13/21 0222  WBC 15.5*  RBC 5.00  HGB 14.7  HCT 45.8  MCV 91.6  MCH 29.4  MCHC 32.1  RDW 13.3  PLT 162   Thyroid No results for input(s): TSH, FREET4 in the last 168 hours.  BNP Recent Labs  Lab 09/13/21 0222  BNP 596.0*    DDimer No results for input(s): DDIMER in the last 168 hours.   Radiology/Studies:  DG Chest Portable 1 View  Result Date: 09/13/2021 CLINICAL DATA:  Shortness of breath EXAM: PORTABLE CHEST 1 VIEW COMPARISON:  12/20/2020 FINDINGS: Cardiac shadow is stable. Aortic calcifications are noted. Increased vascular congestion is noted with mild interstitial edema consistent with CHF. No sizable effusion is noted. IMPRESSION: Mild changes of CHF. Electronically Signed   By: Inez Catalina M.D.   On: 09/13/2021 02:50     Assessment and Plan:   1.Acute on chronic diastolic HF - 10/1171 echo LVEF 50-55%< grade I dd, normal RV - CXR with pulm edema, BNP 596. Repeat echo pending - received IV lasix 40mg  x 2 thus far today with evening dose pending. Incomplete I/Os data thus far, follow renal function with tomorrows labs.   - continue IV diuresis, remains fluid overloaded.   2. Elevated troponin/History of CAD - trop up to 489 in setting of CKD, diastolic HF, severe hypoxia and HTN on presentation - EKG perhaps mild lateral precordial ST depressoin though limited tracing, repeating - echo is pending - he denies any chest pain  - I think most likely demand ischemia in setting of chronic obstructive CAD as opposed to ACS. - follow trop trend, follow up ehco - renal function in general would limit cath consideration in absence of high risk findings - for now  treat medically with hep x 48 hrs, ASA 81, coreg 12.5mg  bid, crestor 20. No ACE/ARB due to renal dysfunction.    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   For questions or updates, please contact [place] Please consult [email or web address] for contact info under    Signed, Carlyle Dolly, MD  09/13/2021 9:18 AM

## 2021-09-14 ENCOUNTER — Inpatient Hospital Stay (HOSPITAL_COMMUNITY): Payer: Medicare Other

## 2021-09-14 DIAGNOSIS — I5033 Acute on chronic diastolic (congestive) heart failure: Secondary | ICD-10-CM

## 2021-09-14 LAB — COMPREHENSIVE METABOLIC PANEL
ALT: 14 U/L (ref 0–44)
AST: 22 U/L (ref 15–41)
Albumin: 3.6 g/dL (ref 3.5–5.0)
Alkaline Phosphatase: 51 U/L (ref 38–126)
Anion gap: 10 (ref 5–15)
BUN: 56 mg/dL — ABNORMAL HIGH (ref 8–23)
CO2: 26 mmol/L (ref 22–32)
Calcium: 8.7 mg/dL — ABNORMAL LOW (ref 8.9–10.3)
Chloride: 98 mmol/L (ref 98–111)
Creatinine, Ser: 2.35 mg/dL — ABNORMAL HIGH (ref 0.61–1.24)
GFR, Estimated: 27 mL/min — ABNORMAL LOW (ref >60)
Glucose, Bld: 190 mg/dL — ABNORMAL HIGH (ref 70–99)
Potassium: 4.5 mmol/L (ref 3.5–5.1)
Sodium: 134 mmol/L — ABNORMAL LOW (ref 135–145)
Total Bilirubin: 1 mg/dL (ref 0.3–1.2)
Total Protein: 7 g/dL (ref 6.5–8.1)

## 2021-09-14 LAB — ECHOCARDIOGRAM COMPLETE
AR max vel: 1.85 cm2
AV Area VTI: 1.83 cm2
AV Area mean vel: 1.8 cm2
AV Mean grad: 4 mmHg
AV Peak grad: 8.1 mmHg
Ao pk vel: 1.42 m/s
Area-P 1/2: 3.17 cm2
Height: 67 in
MV VTI: 1.54 cm2
S' Lateral: 4.15 cm
Weight: 3128.77 [oz_av]

## 2021-09-14 LAB — MAGNESIUM: Magnesium: 2.7 mg/dL — ABNORMAL HIGH (ref 1.7–2.4)

## 2021-09-14 LAB — GLUCOSE, CAPILLARY
Glucose-Capillary: 236 mg/dL — ABNORMAL HIGH (ref 70–99)
Glucose-Capillary: 291 mg/dL — ABNORMAL HIGH (ref 70–99)
Glucose-Capillary: 227 mg/dL — ABNORMAL HIGH (ref 70–99)
Glucose-Capillary: 219 mg/dL — ABNORMAL HIGH (ref 70–99)

## 2021-09-14 LAB — CBC
HCT: 38.8 % — ABNORMAL LOW (ref 39.0–52.0)
Hemoglobin: 12.3 g/dL — ABNORMAL LOW (ref 13.0–17.0)
MCH: 29.1 pg (ref 26.0–34.0)
MCHC: 31.7 g/dL (ref 30.0–36.0)
MCV: 91.9 fL (ref 80.0–100.0)
Platelets: 160 10*3/uL (ref 150–400)
RBC: 4.22 MIL/uL (ref 4.22–5.81)
RDW: 13.3 % (ref 11.5–15.5)
WBC: 12 10*3/uL — ABNORMAL HIGH (ref 4.0–10.5)
nRBC: 0 % (ref 0.0–0.2)

## 2021-09-14 LAB — MRSA NEXT GEN BY PCR, NASAL: MRSA by PCR Next Gen: NOT DETECTED

## 2021-09-14 LAB — APTT: aPTT: 41 seconds — ABNORMAL HIGH (ref 24–36)

## 2021-09-14 LAB — PHOSPHORUS: Phosphorus: 4 mg/dL (ref 2.5–4.6)

## 2021-09-14 MED ORDER — ENOXAPARIN SODIUM 100 MG/ML IJ SOSY
90.0000 mg | PREFILLED_SYRINGE | INTRAMUSCULAR | Status: DC
  Administered 2021-09-14: 90 mg via SUBCUTANEOUS
  Filled 2021-09-14: qty 1

## 2021-09-14 MED ORDER — HYDRALAZINE HCL 20 MG/ML IJ SOLN
5.0000 mg | Freq: Once | INTRAMUSCULAR | Status: DC
  Filled 2021-09-14: qty 1

## 2021-09-14 MED ORDER — PERFLUTREN LIPID MICROSPHERE
1.0000 mL | INTRAVENOUS | Status: AC | PRN
Start: 2021-09-14 — End: 2021-09-14
  Administered 2021-09-14: 4 mL via INTRAVENOUS
  Filled 2021-09-14: qty 10

## 2021-09-14 MED ORDER — POLYETHYLENE GLYCOL 3350 17 G PO PACK
17.0000 g | PACK | Freq: Every day | ORAL | Status: DC
  Administered 2021-09-15: 17 g via ORAL
  Filled 2021-09-14 (×2): qty 1

## 2021-09-14 NOTE — Progress Notes (Signed)
1338 Patient transferring to dept 300 room# 312, called to give report to receiving nurse who is unavailable to receive report at this time & reported that she would call this RN back

## 2021-09-14 NOTE — Progress Notes (Signed)
*  PRELIMINARY RESULTS* Echocardiogram 2D Echocardiogram has been performed.  John [name] 09/14/2021, 9:28 AM

## 2021-09-14 NOTE — Progress Notes (Signed)
PROGRESS NOTE    John [name]  JIR:[id number] DOB: 06-26-1941 DOA: 09/13/2021 PCP: [name], DO   Brief Narrative:   John [name] is an 80 y.o. obese male with medical history significant for COPD, type 2 diabetes mellitus, OSA on CPAP, diabetic polyneuropathy, chronic diastolic heart failure, essential hypertension, CAD, PVD who presents to the emergency department due to shortness of breath.  He has been admitted with acute hypoxemic and hypercarbic respiratory failure in the setting of acute on chronic diastolic congestive heart failure.  He is also noted to have troponin elevation in the setting of CAD.  He has been seen by cardiology with recommendations to continue IV diuresis as well as initiate IV heparin with plans for conservative management.  2D echocardiogram ordered and pending.  Assessment & Plan:   Principal Problem:   Acute exacerbation of CHF (congestive heart failure) (HCC) Active Problems:   CAD (coronary artery disease) of artery bypass graft   Mixed hyperlipidemia   Peripheral vascular disease (HCC)   Essential hypertension   Acute respiratory failure with hypoxia and hypercapnia (HCC)   Leukocytosis   Hyperglycemia due to diabetes mellitus (HCC)   Hypocalcemia   Elevated troponin   Obstructive sleep apnea   BPH (benign prostatic hyperplasia)   CKD (chronic kidney disease), stage III (HCC)   Diabetic neuropathy (HCC)   Acute and chronic hypoxemic/hypercarbic respiratory failure secondary to acute on chronic diastolic heart failure -Wean BiPAP as tolerated -Repeat 2D echocardiogram pending with prior 12/2020 with LVEF 50-55% and grade 1 diastolic dysfunction -Discontinue IV diuresis due to renal intolerance and apparent euvolemia -Strict I's and O's and daily weights -Okay to transfer to telemetry   Elevated troponin in the setting of CAD/PVD -Likely in the setting of CKD and heart failure and likely demand ischemia -Repeat 2D echocardiogram pending;  performed 11/24 -Appreciate cardiology recommendations with recommendations for heparin drip for 48 hours as well as aspirin, Coreg, and Crestor -Continue to monitor   History of COPD -DuoNebs as needed   Hypertensive emergency-resolved -Now off nitroglycerin drip   CKD 3B/4 -Creatinine at baseline 1.8-2.3 -Creatinine starting to elevate and patient now appears euvolemic, discontinue Lasix   Hyperglycemia secondary to type 2 diabetes and recent steroid use -Continue SSI and Semglee 10 units twice daily -On Neurontin for associated diabetic neuropathy   Obstructive sleep apnea -Noncompliant with home CPAP   BPH with urinary retention -Continue Flomax and Foley catheter placement, voiding trials prior to discharge -Urology follow-up outpatient   Obesity -Lifestyle changes outpatient   Hard of hearing   DVT prophylaxis:Lovenox full dose Code Status: Full Family Communication: Tried calling daughter 11/24 with no response Disposition Plan:  Status is: Inpatient  Remains inpatient appropriate because: Need of IV medications and close monitoring.   Consultants:  Cardiology  Procedures:  2D echo pending  Antimicrobials:  None   Subjective: Patient seen and evaluated today with no new acute complaints or concerns. No acute concerns or events noted overnight.  His shortness of breath is much improved and he denies any further chest pain.  Objective: Vitals:   09/14/21 0452 09/14/21 0500 09/14/21 0732 09/14/21 1009  BP:   137/73 (!) 168/75  Pulse:   66   Resp:   18   Temp: (!) 97.5 F (36.4 C)  97.9 F (36.6 C)   TempSrc: Oral  Oral   SpO2:   95%   Weight:  88.7 kg    Height:        Intake/Output Summary (  Last 24 hours) at 09/14/2021 1157 Last data filed at 09/14/2021 0000 Gross per 24 hour  Intake 392.42 ml  Output 2350 ml  Net -1957.58 ml   Filed Weights   09/13/21 0215 09/13/21 1617 09/14/21 0500  Weight: 89 kg 89.2 kg 88.7 kg     Examination:  General exam: Appears calm and comfortable  Respiratory system: Clear to auscultation. Respiratory effort normal.  Currently on 4 L nasal cannula Cardiovascular system: S1 & S2 heard, RRR.  Gastrointestinal system: Abdomen is soft Central nervous system: Alert and awake Extremities: No edema Skin: No significant lesions noted Psychiatry: Flat affect.    Data Reviewed: I have personally reviewed following labs and imaging studies  CBC: Recent Labs  Lab 09/13/21 0222 09/14/21 0423  WBC 15.5* 12.0*  NEUTROABS 11.9*  --   HGB 14.7 12.3*  HCT 45.8 38.8*  MCV 91.6 91.9  PLT 162 229   Basic Metabolic Panel: Recent Labs  Lab 09/13/21 0222 09/14/21 0423  NA 135 134*  K 4.8 4.5  CL 102 98  CO2 23 26  GLUCOSE 174* 190*  BUN 30* 56*  CREATININE 1.98* 2.35*  CALCIUM 8.8* 8.7*  MG  --  2.7*  PHOS  --  4.0   GFR: Estimated Creatinine Clearance: 25.7 mL/min (A) (by C-G formula based on SCr of 2.35 mg/dL (H)). Liver Function Tests: Recent Labs  Lab 09/13/21 0222 09/14/21 0423  AST 17 22  ALT 12 14  ALKPHOS 67 51  BILITOT 1.2 1.0  PROT 7.6 7.0  ALBUMIN 4.0 3.6   No results for input(s): LIPASE, AMYLASE in the last 168 hours. No results for input(s): AMMONIA in the last 168 hours. Coagulation Profile: No results for input(s): INR, PROTIME in the last 168 hours. Cardiac Enzymes: No results for input(s): CKTOTAL, CKMB, CKMBINDEX, TROPONINI in the last 168 hours. BNP (last 3 results) No results for input(s): PROBNP in the last 8760 hours. HbA1C: Recent Labs    09/13/21 0756  HGBA1C 8.7*   CBG: Recent Labs  Lab 09/13/21 0831 09/13/21 1250 09/13/21 1623 09/13/21 2136 09/14/21 1015  GLUCAP 214* 387* 369* 220* 236*   Lipid Profile: No results for input(s): CHOL, HDL, LDLCALC, TRIG, CHOLHDL, LDLDIRECT in the last 72 hours. Thyroid Function Tests: No results for input(s): TSH, T4TOTAL, FREET4, T3FREE, THYROIDAB in the last 72 hours. Anemia  Panel: No results for input(s): VITAMINB12, FOLATE, FERRITIN, TIBC, IRON, RETICCTPCT in the last 72 hours. Sepsis Labs: No results for input(s): PROCALCITON, LATICACIDVEN in the last 168 hours.  Recent Results (from the past 240 hour(s))  Resp Panel by RT-PCR (Flu A&B, Covid) Nasopharyngeal Swab     Status: None   Collection Time: 09/13/21  2:14 AM   Specimen: Nasopharyngeal Swab; Nasopharyngeal(NP) swabs in vial transport medium  Result Value Ref Range Status   SARS Coronavirus 2 by RT PCR NEGATIVE NEGATIVE Final    Comment: (NOTE) SARS-CoV-2 target nucleic acids are NOT DETECTED.  The SARS-CoV-2 RNA is generally detectable in upper respiratory specimens during the acute phase of infection. The lowest concentration of SARS-CoV-2 viral copies this assay can detect is 138 copies/mL. A negative result does not preclude SARS-Cov-2 infection and should not be used as the sole basis for treatment or other patient management decisions. A negative result may occur with  improper specimen collection/handling, submission of specimen other than nasopharyngeal swab, presence of viral mutation(s) within the areas targeted by this assay, and inadequate number of viral copies(<138 copies/mL). A negative result must  be combined with clinical observations, patient history, and epidemiological information. The expected result is Negative.  Fact Sheet for Patients:  EntrepreneurPulse.com.au  Fact Sheet for Healthcare Providers:  IncredibleEmployment.be  This test is no t yet approved or cleared by the [place] FDA and  has been authorized for detection and/or diagnosis of SARS-CoV-2 by FDA under an Emergency Use Authorization (EUA). This EUA will remain  in effect (meaning this test can be used) for the duration of the COVID-19 declaration under Section 564(b)(1) of the Act, 21 U.S.C.section 360bbb-3(b)(1), unless the authorization is terminated  or  revoked sooner.       Influenza A by PCR NEGATIVE NEGATIVE Final   Influenza B by PCR NEGATIVE NEGATIVE Final    Comment: (NOTE) The Xpert Xpress SARS-CoV-2/FLU/RSV plus assay is intended as an aid in the diagnosis of influenza from Nasopharyngeal swab specimens and should not be used as a sole basis for treatment. Nasal washings and aspirates are unacceptable for Xpert Xpress SARS-CoV-2/FLU/RSV testing.  Fact Sheet for Patients: EntrepreneurPulse.com.au  Fact Sheet for Healthcare Providers: IncredibleEmployment.be  This test is not yet approved or cleared by the [place] FDA and has been authorized for detection and/or diagnosis of SARS-CoV-2 by FDA under an Emergency Use Authorization (EUA). This EUA will remain in effect (meaning this test can be used) for the duration of the COVID-19 declaration under Section 564(b)(1) of the Act, 21 U.S.C. section 360bbb-3(b)(1), unless the authorization is terminated or revoked.  Performed at [facility], [address], [place], [place] [address]   MRSA Next Gen by PCR, Nasal     Status: None   Collection Time: 09/13/21  4:24 PM   Specimen: Nasal Mucosa; Nasal Swab  Result Value Ref Range Status   MRSA by PCR Next Gen NOT DETECTED NOT DETECTED Final    Comment: (NOTE) The GeneXpert MRSA Assay (FDA approved for NASAL specimens only), is one component of a comprehensive MRSA colonization surveillance program. It is not intended to diagnose MRSA infection nor to guide or monitor treatment for MRSA infections. Test performance is not FDA approved in patients less than 89 years old. Performed at [facility], [address], [place], [place] [address]          Radiology Studies: DG Chest Portable 1 View  Result Date: 09/13/2021 CLINICAL DATA:  Shortness of breath EXAM: PORTABLE CHEST 1 VIEW COMPARISON:  12/20/2020 FINDINGS: Cardiac shadow is stable. Aortic calcifications are noted. Increased  vascular congestion is noted with mild interstitial edema consistent with CHF. No sizable effusion is noted. IMPRESSION: Mild changes of CHF. Electronically Signed   By: Inez Catalina M.D.   On: 09/13/2021 02:50        Scheduled Meds:  aspirin EC  81 mg Oral Daily   calcium carbonate  2.5 tablet Oral Q breakfast   carvedilol  12.5 mg Oral BID WC   Chlorhexidine Gluconate Cloth  6 each Topical Q0600   enoxaparin (LOVENOX) injection  90 mg Subcutaneous Q24H   gabapentin  300 mg Oral TID   hydrALAZINE  5 mg Intravenous Once   insulin aspart  0-5 Units Subcutaneous QHS   insulin aspart  0-9 Units Subcutaneous TID WC   insulin glargine-yfgn  10 Units Subcutaneous BID   pantoprazole  40 mg Oral Daily   rosuvastatin  20 mg Oral Daily   tamsulosin  0.4 mg Oral QPM   Continuous Infusions:  nitroGLYCERIN Stopped (09/13/21 1754)     LOS: 1 day  Time spent: 35 minutes    Lessly Stigler Darleen Crocker, DO [facility]  If 7PM-7AM, please contact night-coverage [email or web address] 09/14/2021, 11:57 AM

## 2021-09-14 NOTE — Plan of Care (Signed)
  Problem: Education: Goal: Knowledge of General Education information will improve Description Including pain rating scale, medication(s)/side effects and non-pharmacologic comfort measures Outcome: Progressing   Problem: Health Behavior/Discharge Planning: Goal: Ability to manage health-related needs will improve Outcome: Progressing   

## 2021-09-14 NOTE — Progress Notes (Signed)
1417 Reported given to [facility] receiving nurse on dept 300

## 2021-09-14 NOTE — Progress Notes (Signed)
Patient requested Miralax which he regularly takes at home, MD notified regarding patient request

## 2021-09-15 ENCOUNTER — Encounter: Payer: Self-pay | Admitting: Physician Assistant

## 2021-09-15 LAB — CBC
HCT: 37.3 % — ABNORMAL LOW (ref 39.0–52.0)
Hemoglobin: 12.2 g/dL — ABNORMAL LOW (ref 13.0–17.0)
MCH: 29.8 pg (ref 26.0–34.0)
MCHC: 32.7 g/dL (ref 30.0–36.0)
MCV: 91.2 fL (ref 80.0–100.0)
Platelets: 150 10*3/uL (ref 150–400)
RBC: 4.09 MIL/uL — ABNORMAL LOW (ref 4.22–5.81)
RDW: 13.1 % (ref 11.5–15.5)
WBC: 8.2 10*3/uL (ref 4.0–10.5)
nRBC: 0 % (ref 0.0–0.2)

## 2021-09-15 LAB — BASIC METABOLIC PANEL
Anion gap: 9 (ref 5–15)
BUN: 57 mg/dL — ABNORMAL HIGH (ref 8–23)
CO2: 25 mmol/L (ref 22–32)
Calcium: 8.8 mg/dL — ABNORMAL LOW (ref 8.9–10.3)
Chloride: 102 mmol/L (ref 98–111)
Creatinine, Ser: 2.15 mg/dL — ABNORMAL HIGH (ref 0.61–1.24)
GFR, Estimated: 30 mL/min — ABNORMAL LOW (ref >60)
Glucose, Bld: 130 mg/dL — ABNORMAL HIGH (ref 70–99)
Potassium: 4.3 mmol/L (ref 3.5–5.1)
Sodium: 136 mmol/L (ref 135–145)

## 2021-09-15 LAB — MAGNESIUM: Magnesium: 2.5 mg/dL — ABNORMAL HIGH (ref 1.7–2.4)

## 2021-09-15 LAB — GLUCOSE, CAPILLARY
Glucose-Capillary: 130 mg/dL — ABNORMAL HIGH (ref 70–99)
Glucose-Capillary: 226 mg/dL — ABNORMAL HIGH (ref 70–99)
Glucose-Capillary: 230 mg/dL — ABNORMAL HIGH (ref 70–99)

## 2021-09-15 NOTE — Discharge Summary (Signed)
Physician Discharge Summary  John [name] SAY:[id number] DOB: 03-Jan-1941 DOA: 09/13/2021  PCP: [name], DO  Admit date: 09/13/2021  Discharge date: 09/15/2021  Admitted From:Home  Disposition:  Home  Recommendations for Outpatient Follow-up:  Follow up with PCP in 1-2 weeks Recommend urology referral in the near future to evaluate BPH Follow-up with cardiology Dr. [name] which will be scheduled Continue on medications as noted below  Home Health: None  Equipment/Devices: None  Discharge Condition:Stable  CODE STATUS: Full  Diet recommendation: Heart Healthy/carb modified  Brief/Interim Summary:  John [name] is an 80 y.o. obese male with medical history significant for COPD, type 2 diabetes mellitus, OSA on CPAP, diabetic polyneuropathy, chronic diastolic heart failure, essential hypertension, CAD, PVD who presented to the emergency department due to shortness of breath.  He had been admitted with acute hypoxemic and hypercarbic respiratory failure in the setting of acute on chronic diastolic congestive heart failure.  He was also noted to have troponin elevation in the setting of CAD.  He was evaluated by cardiology and was initially started on some diuresis as well as full dose Lovenox for treatment of suspected ACS.  His 2D echocardiogram was reviewed and his LVEF is near 45%.  2D echocardiogram was reviewed by Dr. Harrington Challenger and there appear to be no significant new changes compared to his prior echocardiogram on 3/22.  It is recommended that he continue on medications as noted below and follow-up with his cardiologist Dr. [name] in the near future.  No other acute events noted throughout the course of the stay and he is overall stable for discharge today.  Discharge Diagnoses:  Principal Problem:   Acute exacerbation of CHF (congestive heart failure) (HCC) Active Problems:   CAD (coronary artery disease) of artery bypass graft   Mixed hyperlipidemia   Peripheral  vascular disease (HCC)   Essential hypertension   Acute respiratory failure with hypoxia and hypercapnia (HCC)   Leukocytosis   Hyperglycemia due to diabetes mellitus (HCC)   Hypocalcemia   Elevated troponin   Obstructive sleep apnea   BPH (benign prostatic hyperplasia)   CKD (chronic kidney disease), stage III (HCC)   Diabetic neuropathy ([place])  Principal discharge diagnosis: Acute on chronic hypoxemic and hypercarbic respiratory failure secondary to acute on chronic diastolic heart failure decompensation with elevated troponin.  Discharge Instructions  Discharge Instructions     Diet - low sodium heart healthy   Complete by: As directed    Increase activity slowly   Complete by: As directed       Allergies as of 09/15/2021       Reactions   Penicillins Hives   Ticagrelor Shortness Of Breath   Ezetimibe-simvastatin Other (See Comments)   Myalgia        Medication List     STOP taking these medications    amLODipine 5 MG tablet Commonly known as: NORVASC   candesartan 32 MG tablet Commonly known as: ATACAND       TAKE these medications    albuterol 108 (90 Base) MCG/ACT inhaler Commonly known as: VENTOLIN HFA Inhale 2 puffs into the lungs every 6 (six) hours as needed for wheezing or shortness of breath.   allopurinol 300 MG tablet Commonly known as: ZYLOPRIM Take 300 mg by mouth daily.   aspirin 81 MG tablet Take 81 mg by mouth daily. Take 1 tab daily   canagliflozin 100 MG Tabs tablet Commonly known as: INVOKANA Take 100 mg by mouth daily.   carvedilol 12.5 MG tablet Commonly  known as: COREG Take 1 tablet (12.5 mg total) by mouth 2 (two) times daily with a meal.   cholecalciferol 25 MCG (1000 UNIT) tablet Commonly known as: VITAMIN D3 Take 1 tablet by mouth daily.   furosemide 20 MG tablet Commonly known as: Lasix Take 1 tablet (20 mg total) by mouth every other day. May take extra tablet as needed for 2-3 lb weight gain What changed:  when to take this   gabapentin 300 MG capsule Commonly known as: NEURONTIN Take 300 mg by mouth 2 (two) times daily.   glimepiride 4 MG tablet Commonly known as: AMARYL Take 4 mg by mouth daily.   Lantus SoloStar 100 UNIT/ML Solostar Pen Generic drug: insulin glargine Inject 43 Units into the skin daily.   pantoprazole 40 MG tablet Commonly known as: PROTONIX Take 40 mg by mouth daily.   rosuvastatin 20 MG tablet Commonly known as: CRESTOR TAKE 1 TABLET BY MOUTH EVERY DAY IN THE EVENING What changed:  how much to take how to take this when to take this   senna 8.6 MG tablet Commonly known as: SENOKOT Take 1 tablet by mouth as needed for constipation.   sertraline 25 MG tablet Commonly known as: ZOLOFT Take 1 tablet (25 mg total) by mouth every evening.   tamsulosin 0.4 MG Caps capsule Commonly known as: FLOMAX Take 0.4 mg by mouth every evening.   VITAMIN B 12 PO Take 1 tablet by mouth daily.        Follow-up Information     Lelon Perla, MD Follow up.   Specialty: Cardiology Why: [facility] - the cardiology office will call you to arrange a follow-up appointment. Contact information: [address] [place] [address] [phone]         [name], DO. Schedule an appointment as soon as possible for a visit in 1 week(s).   Specialty: Internal Medicine Contact information: [place] [address] [phone]                Allergies  Allergen Reactions   Penicillins Hives   Ticagrelor Shortness Of Breath   Ezetimibe-Simvastatin Other (See Comments)    Myalgia     Consultations: Cardiology   Procedures/Studies: DG Chest Portable 1 View  Result Date: 09/13/2021 CLINICAL DATA:  Shortness of breath EXAM: PORTABLE CHEST 1 VIEW COMPARISON:  12/20/2020 FINDINGS: Cardiac shadow is stable. Aortic calcifications are noted. Increased vascular congestion is noted with mild interstitial edema  consistent with CHF. No sizable effusion is noted. IMPRESSION: Mild changes of CHF. Electronically Signed   By: Inez Catalina M.D.   On: 09/13/2021 02:50   ECHOCARDIOGRAM COMPLETE  Result Date: 09/14/2021    ECHOCARDIOGRAM REPORT   Patient Name:   John [name] Date of Exam: 09/14/2021 Medical Rec #:  [id number]     Height:       67.0 in Accession #:    [id number]    Weight:       195.5 lb Date of Birth:  12-27-1940      BSA:          2.003 m Patient Age:    38 years      BP:           137/73 mmHg Patient Gender: M             HR:           67 bpm. Exam Location:  [facility] Procedure: 2D Echo, Cardiac Doppler  and Color Doppler Indications:    CHF  History:        Patient has prior history of Echocardiogram examinations, most                 recent 12/20/2020. CHF, Previous Myocardial Infarction and CAD,                 Prior CABG, Signs/Symptoms:Chest Pain; Risk                 Factors:Hypertension, Diabetes and Former Smoker.  Sonographer:    Wenda Low Referring Phys: [id number] OLADAPO ADEFESO  Sonographer Comments: Image acquisition challenging due to respiratory motion. IMPRESSIONS  1. Left ventricular ejection fraction, by estimation, is 40 to 45%. The left ventricle has mildly decreased function. The left ventricle demonstrates regional wall motion abnormalities (see scoring diagram/findings for description). There is moderate left ventricular hypertrophy. Left ventricular diastolic parameters are consistent with Grade II diastolic dysfunction (pseudonormalization).  2. Right ventricular systolic function is normal. The right ventricular size is normal. Tricuspid regurgitation signal is inadequate for assessing PA pressure.  3. Left atrial size was mildly dilated.  4. The mitral valve is grossly normal. Trivial mitral valve regurgitation. No evidence of mitral stenosis.  5. The aortic valve is grossly normal. Aortic valve regurgitation is not visualized. No aortic stenosis is present.  6. The inferior  vena cava is normal in size with greater than 50% respiratory variability, suggesting right atrial pressure of 3 mmHg. FINDINGS  Left Ventricle: Left ventricular ejection fraction, by estimation, is 40 to 45%. The left ventricle has mildly decreased function. The left ventricle demonstrates regional wall motion abnormalities. Definity contrast agent was given IV to delineate the left ventricular endocardial borders. The left ventricular internal cavity size was normal in size. There is moderate left ventricular hypertrophy. Left ventricular diastolic parameters are consistent with Grade II diastolic dysfunction (pseudonormalization).  LV Wall Scoring: The entire lateral wall and entire inferior wall are hypokinetic. Right Ventricle: The right ventricular size is normal. No increase in right ventricular wall thickness. Right ventricular systolic function is normal. Tricuspid regurgitation signal is inadequate for assessing PA pressure. Left Atrium: Left atrial size was mildly dilated. Right Atrium: Right atrial size was normal in size. Pericardium: There is no evidence of pericardial effusion. Mitral Valve: The mitral valve is grossly normal. Trivial mitral valve regurgitation. No evidence of mitral valve stenosis. MV peak gradient, 5.2 mmHg. The mean mitral valve gradient is 2.0 mmHg. Tricuspid Valve: The tricuspid valve is normal in structure. Tricuspid valve regurgitation is trivial. No evidence of tricuspid stenosis. Aortic Valve: The aortic valve is grossly normal. Aortic valve regurgitation is not visualized. No aortic stenosis is present. Aortic valve mean gradient measures 4.0 mmHg. Aortic valve peak gradient measures 8.1 mmHg. Aortic valve area, by VTI measures 1.83 cm. Pulmonic Valve: The pulmonic valve was normal in structure. Pulmonic valve regurgitation is trivial. No evidence of pulmonic stenosis. Aorta: The aortic root is normal in size and structure. Venous: The inferior vena cava is normal in size  with greater than 50% respiratory variability, suggesting right atrial pressure of 3 mmHg. IAS/Shunts: No atrial level shunt detected by color flow Doppler.  LEFT VENTRICLE PLAX 2D LVIDd:         5.30 cm   Diastology LVIDs:         4.15 cm   LV e' medial:    5.50 cm/s LV PW:         1.40 cm  LV E/e' medial:  17.5 LV IVS:        1.40 cm   LV e' lateral:   6.94 cm/s LVOT diam:     2.00 cm   LV E/e' lateral: 13.8 LV SV:         67 LV SV Index:   33 LVOT Area:     3.14 cm  RIGHT VENTRICLE RV Basal diam:  3.10 cm RV Mid diam:    2.90 cm RV S prime:     12.10 cm/s TAPSE (M-mode): 2.6 cm LEFT ATRIUM             Index        RIGHT ATRIUM           Index LA diam:        4.40 cm 2.20 cm/m   RA Area:     16.30 cm LA Vol (A2C):   60.3 ml 30.10 ml/m  RA Volume:   38.60 ml  19.27 ml/m LA Vol (A4C):   70.3 ml 35.10 ml/m LA Biplane Vol: 70.3 ml 35.10 ml/m  AORTIC VALVE                    PULMONIC VALVE AV Area (Vmax):    1.85 cm     PV Vmax:       0.91 m/s AV Area (Vmean):   1.80 cm     PV Peak grad:  3.3 mmHg AV Area (VTI):     1.83 cm AV Vmax:           142.00 cm/s AV Vmean:          99.000 cm/s AV VTI:            0.364 m AV Peak Grad:      8.1 mmHg AV Mean Grad:      4.0 mmHg LVOT Vmax:         83.60 cm/s LVOT Vmean:        56.800 cm/s LVOT VTI:          0.212 m LVOT/AV VTI ratio: 0.58  AORTA Ao Root diam: 2.60 cm Ao Asc diam:  3.10 cm MITRAL VALVE MV Area (PHT): 3.17 cm    SHUNTS MV Area VTI:   1.54 cm    Systemic VTI:  0.21 m MV Peak grad:  5.2 mmHg    Systemic Diam: 2.00 cm MV Mean grad:  2.0 mmHg MV Vmax:       1.14 m/s MV Vmean:      68.9 cm/s MV Decel Time: 239 msec MV E velocity: 96.00 cm/s MV A velocity: 89.50 cm/s MV E/A ratio:  1.07 Cherlynn Kaiser MD Electronically signed by Cherlynn Kaiser MD Signature Date/Time: 09/14/2021/6:11:01 PM    Final      Discharge Exam: Vitals:   09/15/21 0459 09/15/21 0831  BP: (!) 117/40 (!) 152/70  Pulse: 70 73  Resp: 16   Temp: (!) 97.4 F (36.3 C)   SpO2: 95%     Vitals:   09/15/21 0100 09/15/21 0459 09/15/21 0500 09/15/21 0831  BP: (!) 130/50 (!) 117/40  (!) 152/70  Pulse: 72 70  73  Resp: 16 16    Temp: 98.3 F (36.8 C) (!) 97.4 F (36.3 C)    TempSrc: Oral Oral    SpO2: 99% 95%    Weight:   88.9 kg   Height:        General: Pt is alert, awake, not in acute distress  Cardiovascular: RRR, S1/S2 +, no rubs, no gallops Respiratory: CTA bilaterally, no wheezing, no rhonchi Abdominal: Soft, NT, moderately distended, bowel sounds + Extremities: no edema, no cyanosis    The results of significant diagnostics from this hospitalization (including imaging, microbiology, ancillary and laboratory) are listed below for reference.     Microbiology: Recent Results (from the past 240 hour(s))  Resp Panel by RT-PCR (Flu A&B, Covid) Nasopharyngeal Swab     Status: None   Collection Time: 09/13/21  2:14 AM   Specimen: Nasopharyngeal Swab; Nasopharyngeal(NP) swabs in vial transport medium  Result Value Ref Range Status   SARS Coronavirus 2 by RT PCR NEGATIVE NEGATIVE Final    Comment: (NOTE) SARS-CoV-2 target nucleic acids are NOT DETECTED.  The SARS-CoV-2 RNA is generally detectable in upper respiratory specimens during the acute phase of infection. The lowest concentration of SARS-CoV-2 viral copies this assay can detect is 138 copies/mL. A negative result does not preclude SARS-Cov-2 infection and should not be used as the sole basis for treatment or other patient management decisions. A negative result may occur with  improper specimen collection/handling, submission of specimen other than nasopharyngeal swab, presence of viral mutation(s) within the areas targeted by this assay, and inadequate number of viral copies(<138 copies/mL). A negative result must be combined with clinical observations, patient history, and epidemiological information. The expected result is Negative.  Fact Sheet for Patients:   EntrepreneurPulse.com.au  Fact Sheet for Healthcare Providers:  IncredibleEmployment.be  This test is no t yet approved or cleared by the [place] FDA and  has been authorized for detection and/or diagnosis of SARS-CoV-2 by FDA under an Emergency Use Authorization (EUA). This EUA will remain  in effect (meaning this test can be used) for the duration of the COVID-19 declaration under Section 564(b)(1) of the Act, 21 U.S.C.section 360bbb-3(b)(1), unless the authorization is terminated  or revoked sooner.       Influenza A by PCR NEGATIVE NEGATIVE Final   Influenza B by PCR NEGATIVE NEGATIVE Final    Comment: (NOTE) The Xpert Xpress SARS-CoV-2/FLU/RSV plus assay is intended as an aid in the diagnosis of influenza from Nasopharyngeal swab specimens and should not be used as a sole basis for treatment. Nasal washings and aspirates are unacceptable for Xpert Xpress SARS-CoV-2/FLU/RSV testing.  Fact Sheet for Patients: EntrepreneurPulse.com.au  Fact Sheet for Healthcare Providers: IncredibleEmployment.be  This test is not yet approved or cleared by the [place] FDA and has been authorized for detection and/or diagnosis of SARS-CoV-2 by FDA under an Emergency Use Authorization (EUA). This EUA will remain in effect (meaning this test can be used) for the duration of the COVID-19 declaration under Section 564(b)(1) of the Act, 21 U.S.C. section 360bbb-3(b)(1), unless the authorization is terminated or revoked.  Performed at [facility], [address], [place], [place] [address]   MRSA Next Gen by PCR, Nasal     Status: None   Collection Time: 09/13/21  4:24 PM   Specimen: Nasal Mucosa; Nasal Swab  Result Value Ref Range Status   MRSA by PCR Next Gen NOT DETECTED NOT DETECTED Final    Comment: (NOTE) The GeneXpert MRSA Assay (FDA approved for NASAL specimens only), is one component of a  comprehensive MRSA colonization surveillance program. It is not intended to diagnose MRSA infection nor to guide or monitor treatment for MRSA infections. Test performance is not FDA approved in patients less than 19 years old. Performed at [facility], [address] [place], [place] [address]  Labs: BNP (last 3 results) Recent Labs    12/19/20 1243 09/13/21 0222  BNP 495.2* 025.4*   Basic Metabolic Panel: Recent Labs  Lab 09/13/21 0222 09/14/21 0423 09/15/21 0523  NA 135 134* 136  K 4.8 4.5 4.3  CL 102 98 102  CO2 23 26 25   GLUCOSE 174* 190* 130*  BUN 30* 56* 57*  CREATININE 1.98* 2.35* 2.15*  CALCIUM 8.8* 8.7* 8.8*  MG  --  2.7* 2.5*  PHOS  --  4.0  --    Liver Function Tests: Recent Labs  Lab 09/13/21 0222 09/14/21 0423  AST 17 22  ALT 12 14  ALKPHOS 67 51  BILITOT 1.2 1.0  PROT 7.6 7.0  ALBUMIN 4.0 3.6   No results for input(s): LIPASE, AMYLASE in the last 168 hours. No results for input(s): AMMONIA in the last 168 hours. CBC: Recent Labs  Lab 09/13/21 0222 09/14/21 0423 09/15/21 0523  WBC 15.5* 12.0* 8.2  NEUTROABS 11.9*  --   --   HGB 14.7 12.3* 12.2*  HCT 45.8 38.8* 37.3*  MCV 91.6 91.9 91.2  PLT 162 160 150   Cardiac Enzymes: No results for input(s): CKTOTAL, CKMB, CKMBINDEX, TROPONINI in the last 168 hours. BNP: Invalid input(s): POCBNP CBG: Recent Labs  Lab 09/14/21 1209 09/14/21 1757 09/14/21 2146 09/15/21 0017 09/15/21 0727  GLUCAP 291* 227* 219* 226* 130*   D-Dimer No results for input(s): DDIMER in the last 72 hours. Hgb A1c Recent Labs    09/13/21 0756  HGBA1C 8.7*   Lipid Profile No results for input(s): CHOL, HDL, LDLCALC, TRIG, CHOLHDL, LDLDIRECT in the last 72 hours. Thyroid function studies No results for input(s): TSH, T4TOTAL, T3FREE, THYROIDAB in the last 72 hours.  Invalid input(s): FREET3 Anemia work up No results for input(s): VITAMINB12, FOLATE, FERRITIN, TIBC, IRON, RETICCTPCT in the last 72  hours. Urinalysis    Component Value Date/Time   COLORURINE YELLOW 12/19/2020 1212   APPEARANCEUR CLEAR 12/19/2020 1212   LABSPEC 1.021 12/19/2020 1212   PHURINE 6.0 12/19/2020 1212   GLUCOSEU >=500 (A) 12/19/2020 1212   HGBUR NEGATIVE 12/19/2020 1212   BILIRUBINUR NEGATIVE 12/19/2020 1212   KETONESUR NEGATIVE 12/19/2020 1212   PROTEINUR 100 (A) 12/19/2020 1212   NITRITE NEGATIVE 12/19/2020 1212   LEUKOCYTESUR NEGATIVE 12/19/2020 1212   Sepsis Labs Invalid input(s): PROCALCITONIN,  WBC,  LACTICIDVEN Microbiology Recent Results (from the past 240 hour(s))  Resp Panel by RT-PCR (Flu A&B, Covid) Nasopharyngeal Swab     Status: None   Collection Time: 09/13/21  2:14 AM   Specimen: Nasopharyngeal Swab; Nasopharyngeal(NP) swabs in vial transport medium  Result Value Ref Range Status   SARS Coronavirus 2 by RT PCR NEGATIVE NEGATIVE Final    Comment: (NOTE) SARS-CoV-2 target nucleic acids are NOT DETECTED.  The SARS-CoV-2 RNA is generally detectable in upper respiratory specimens during the acute phase of infection. The lowest concentration of SARS-CoV-2 viral copies this assay can detect is 138 copies/mL. A negative result does not preclude SARS-Cov-2 infection and should not be used as the sole basis for treatment or other patient management decisions. A negative result may occur with  improper specimen collection/handling, submission of specimen other than nasopharyngeal swab, presence of viral mutation(s) within the areas targeted by this assay, and inadequate number of viral copies(<138 copies/mL). A negative result must be combined with clinical observations, patient history, and epidemiological information. The expected result is Negative.  Fact Sheet for Patients:  EntrepreneurPulse.com.au  Fact Sheet for Healthcare Providers:  IncredibleEmployment.be  This test is no t yet approved or cleared by the [place] and  has been  authorized for detection and/or diagnosis of SARS-CoV-2 by FDA under an Emergency Use Authorization (EUA). This EUA will remain  in effect (meaning this test can be used) for the duration of the COVID-19 declaration under Section 564(b)(1) of the Act, 21 U.S.C.section 360bbb-3(b)(1), unless the authorization is terminated  or revoked sooner.       Influenza A by PCR NEGATIVE NEGATIVE Final   Influenza B by PCR NEGATIVE NEGATIVE Final    Comment: (NOTE) The Xpert Xpress SARS-CoV-2/FLU/RSV plus assay is intended as an aid in the diagnosis of influenza from Nasopharyngeal swab specimens and should not be used as a sole basis for treatment. Nasal washings and aspirates are unacceptable for Xpert Xpress SARS-CoV-2/FLU/RSV testing.  Fact Sheet for Patients: EntrepreneurPulse.com.au  Fact Sheet for Healthcare Providers: IncredibleEmployment.be  This test is not yet approved or cleared by the [place] FDA and has been authorized for detection and/or diagnosis of SARS-CoV-2 by FDA under an Emergency Use Authorization (EUA). This EUA will remain in effect (meaning this test can be used) for the duration of the COVID-19 declaration under Section 564(b)(1) of the Act, 21 U.S.C. section 360bbb-3(b)(1), unless the authorization is terminated or revoked.  Performed at [facility], [address], [place], [place] [address]   MRSA Next Gen by PCR, Nasal     Status: None   Collection Time: 09/13/21  4:24 PM   Specimen: Nasal Mucosa; Nasal Swab  Result Value Ref Range Status   MRSA by PCR Next Gen NOT DETECTED NOT DETECTED Final    Comment: (NOTE) The GeneXpert MRSA Assay (FDA approved for NASAL specimens only), is one component of a comprehensive MRSA colonization surveillance program. It is not intended to diagnose MRSA infection nor to guide or monitor treatment for MRSA infections. Test performance is not FDA approved in patients less than 16  years old. Performed at [facility], [address] [place], [place] [address]      Time coordinating discharge: 35 minutes  SIGNED:   Rodena Goldmann, DO [facility] 09/15/2021, 9:36 AM  If 7PM-7AM, please contact night-coverage [email or web address]

## 2021-09-15 NOTE — Progress Notes (Signed)
Dr. Harrington Challenger requested we arrange follow-up for this patient. I have sent a message to our office's scheduling team requesting a follow-up appointment, and our office will call the patient with this information.

## 2021-09-15 NOTE — Progress Notes (Signed)
Nsg Discharge Note  Admit Date:  09/13/2021 Discharge date: 09/15/2021   John [name] to be D/C'd Home per MD order.  AVS completed.   Patient/caregiver able to verbalize understanding.  Discharge Medication: Allergies as of 09/15/2021       Reactions   Penicillins Hives   Ticagrelor Shortness Of Breath   Ezetimibe-simvastatin Other (See Comments)   Myalgia        Medication List     STOP taking these medications    amLODipine 5 MG tablet Commonly known as: NORVASC   candesartan 32 MG tablet Commonly known as: ATACAND       TAKE these medications    albuterol 108 (90 Base) MCG/ACT inhaler Commonly known as: VENTOLIN HFA Inhale 2 puffs into the lungs every 6 (six) hours as needed for wheezing or shortness of breath.   allopurinol 300 MG tablet Commonly known as: ZYLOPRIM Take 300 mg by mouth daily.   aspirin 81 MG tablet Take 81 mg by mouth daily. Take 1 tab daily   canagliflozin 100 MG Tabs tablet Commonly known as: INVOKANA Take 100 mg by mouth daily.   carvedilol 12.5 MG tablet Commonly known as: COREG Take 1 tablet (12.5 mg total) by mouth 2 (two) times daily with a meal.   cholecalciferol 25 MCG (1000 UNIT) tablet Commonly known as: VITAMIN D3 Take 1 tablet by mouth daily.   furosemide 20 MG tablet Commonly known as: Lasix Take 1 tablet (20 mg total) by mouth every other day. May take extra tablet as needed for 2-3 lb weight gain What changed: when to take this   gabapentin 300 MG capsule Commonly known as: NEURONTIN Take 300 mg by mouth 2 (two) times daily.   glimepiride 4 MG tablet Commonly known as: AMARYL Take 4 mg by mouth daily.   Lantus SoloStar 100 UNIT/ML Solostar Pen Generic drug: insulin glargine Inject 43 Units into the skin daily.   pantoprazole 40 MG tablet Commonly known as: PROTONIX Take 40 mg by mouth daily.   rosuvastatin 20 MG tablet Commonly known as: CRESTOR TAKE 1 TABLET BY MOUTH EVERY DAY IN THE  EVENING What changed:  how much to take how to take this when to take this   senna 8.6 MG tablet Commonly known as: SENOKOT Take 1 tablet by mouth as needed for constipation.   sertraline 25 MG tablet Commonly known as: ZOLOFT Take 1 tablet (25 mg total) by mouth every evening.   tamsulosin 0.4 MG Caps capsule Commonly known as: FLOMAX Take 0.4 mg by mouth every evening.   VITAMIN B 12 PO Take 1 tablet by mouth daily.        Discharge Assessment: Vitals:   09/15/21 0459 09/15/21 0831  BP: (!) 117/40 (!) 152/70  Pulse: 70 73  Resp: 16   Temp: (!) 97.4 F (36.3 C)   SpO2: 95%    Skin clean, dry and intact without evidence of skin break down, no evidence of skin tears noted. IV catheter discontinued intact. Site without signs and symptoms of complications - no redness or edema noted at insertion site, patient denies c/o pain - only slight tenderness at site.  Dressing with slight pressure applied.  D/c Instructions-Education: Discharge instructions given to patient/family with verbalized understanding. D/c education completed with patient/family including follow up instructions, medication list, d/c activities limitations if indicated, with other d/c instructions as indicated by MD - patient able to verbalize understanding, all questions fully answered. Patient instructed to return to ED, call 911,  or call MD for any changes in condition.  Patient escorted via [place], and D/C home via private auto.  Kathie Rhodes, RN 09/15/2021 12:24 PM

## 2021-09-18 ENCOUNTER — Telehealth: Payer: Self-pay | Admitting: Cardiology

## 2021-09-18 MED ORDER — CARVEDILOL 12.5 MG PO TABS: 12.5000 mg | ORAL_TABLET | Freq: Two times a day (BID) | ORAL | 5 refills | Status: DC

## 2021-09-18 NOTE — Telephone Encounter (Signed)
*  STAT* If patient is at the pharmacy, call can be transferred to refill team.   1. Which medications need to be refilled? (please list name of each medication and dose if known) need a prescription to local pharmacy until his mail order comes- Carvedilol  2. Which pharmacy/location (including street and city if local pharmacy) is medication to be sent to? [facility]  [address] [place]  3. Do they need a 30 day or 90 day supply? #20

## 2021-09-21 NOTE — Progress Notes (Signed)
Cardiology Office Note   Date:  09/22/2021   ID:  John [name], DOB [birth year], MRN [id number]  PCP:  [name], DO  Cardiologist:  Dr.Crenshaw  CC: Hospital Follow Up     History of Present Illness: John [name] is a 80 y.o. male who presents for posthospitalization follow-up with known history of CAD, PAD, chronic diastolic heart failure, who was seen recently on consultation by Dr. Carlyle Dolly on 09/13/2021 after admission for shortness of breath.  John [name] has additional history of COPD, type 2 diabetes, OSA on CPAP, diabetic polyneuropathy, hypertension.  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  There was a 30% to 40% mid right coronary artery and a 30 to 40% mid PDA.  His left main was found to be normal.  There was an 80% proximal LAD and a 78% mid lesion, the circumflex had a 90% lesion after the origin of the first marginal but continued mainly is a moderate sized atrial branch.  Therefore the John [name] had an intervention using PCI of his LAD with 2 Ion drug-eluting stents.  His P[facility].  John [name] was diagnosed with acute on chronic diastolic heart failure echocardiogram was ordered to compare to 1 completed in March 2022 with a normal EF of 50 to 55% with grade 1 diastolic dysfunction.  His chest x-ray revealed pulmonary edema with a BNP of 596.  The John [name] was given IV diuretics.  John [name] was found to have elevated troponin in the setting of chronic kidney disease and diastolic CHF along with sequela of severe hypoxia.  John [name] was diagnosed with demand ischemia concerning his elevated troponin.  Repeat echocardiogram on 09/14/2021 revealed an EF of 40 to 45% with LV mildly decreased in function.  There is moderate left ventricular hypertrophy and grade 2 diastolic dysfunction.  There were no valvular abnormalities.  The  echocardiogram was reviewed by Dr. Harrington Challenger who stated that there appeared to be no significant new changes compared to prior echo in March 2022.  John [name] was recommended to continue medications and follow-up with Dr. [name].  Amlodipine 5 mg and candesartan 32 mg tablets were discontinued.  John [name] was started on carvedilol 12.5 mg twice daily Lasix 20 mg every other day but take extra tablet as needed for 2 to 3 pound weight gain, and continue medications as directed.  John [name] comes today fatigued but doing well.  John [name] has not gained any weight.  John [name] is concerned about his blood glucose fluctuations, and the possibility of recurrent CHF.  John [name] is medically compliant.  John [name] is trying to avoid salt and John [name] is weighing daily.   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)    BPH (benign prostatic hyperplasia)    CAD (coronary artery disease)    Cholesteatoma of attic    Depression    Diabetes mellitus ([place])    Ear disease    GERD (gastroesophageal reflux disease)    Hyperlipidemia    Hypertension    PVD (peripheral vascular disease) ([place])    Stroke [facility])     Past Surgical History:  Procedure Laterality Date   BONE ANCHORED HEARING AID IMPLANT Left 06/25/2013   Procedure: BONE ANCHORED HEARING AID (BAHA) IMPLANT;  Surgeon: Fannie Knee, MD;  Location: [place];  Service: ENT;  Laterality: Left;   CAROTID STENT INSERTION Left    COCHLEAR IMPLANT     CORONARY STENT PLACEMENT     MASS EXCISION Left 06/25/2013   Procedure: EXCISION LEFT TEMPORAL MASS;  Surgeon: Fannie Knee, MD;  Location: [place];  Service: ENT;  Laterality: Left;   TONSILLECTOMY       Current Outpatient Medications  Medication Sig Dispense Refill   albuterol (VENTOLIN HFA) 108 (90 Base) MCG/ACT inhaler Inhale 2 puffs into the lungs every 6 (six) hours as needed for wheezing or shortness of breath. 8 g 2   allopurinol (ZYLOPRIM) 300 MG tablet Take 300 mg by mouth daily.     aspirin 81 MG  tablet Take 81 mg by mouth daily. Take 1 tab daily     canagliflozin (INVOKANA) 100 MG TABS tablet Take 100 mg by mouth daily.     carvedilol (COREG) 12.5 MG tablet Take 1 tablet (12.5 mg total) by mouth 2 (two) times daily with a meal. 30 tablet 5   cholecalciferol (VITAMIN D3) 25 MCG (1000 UNIT) tablet Take 1 tablet by mouth daily.     Cyanocobalamin (VITAMIN B 12 PO) Take 1 tablet by mouth daily.     furosemide (LASIX) 20 MG tablet Take 1 tablet (20 mg total) by mouth every other day. May take extra tablet as needed for 2-3 lb weight gain (John [name] taking differently: Take 20 mg by mouth daily. May take extra tablet as needed for 2-3 lb weight gain) 30 tablet 2   gabapentin (NEURONTIN) 300 MG capsule Take 300 mg by mouth 2 (two) times daily.     glimepiride (AMARYL) 4 MG tablet Take 4 mg by mouth daily.     insulin glargine (LANTUS SOLOSTAR) 100 UNIT/ML Solostar Pen Inject 43 Units into the skin daily.     pantoprazole (PROTONIX) 40 MG tablet Take 40 mg by mouth daily.     rosuvastatin (CRESTOR) 20 MG tablet TAKE 1 TABLET BY MOUTH EVERY DAY IN THE EVENING (John [name] taking differently: Take 20 mg by mouth daily.) 90 tablet 1   senna (SENOKOT) 8.6 MG tablet Take 1 tablet by mouth as needed for constipation.     sertraline (ZOLOFT) 25 MG tablet Take 1 tablet (25 mg total) by mouth every evening. 90 tablet 0   tamsulosin (FLOMAX) 0.4 MG CAPS capsule Take 0.4 mg by mouth every evening.     No current facility-administered medications for this visit.    Allergies:   Penicillins, Ticagrelor, and Ezetimibe-simvastatin    Social History:  The John [name]  reports that John [name] quit smoking about 32 years ago. His smoking use included cigarettes. John [name] has never used smokeless tobacco. John [name] reports that John [name] does not currently use alcohol. John [name] reports that John [name] does not use drugs.   Family History:  The John [name]'s family history includes Depression in some other family members; Diabetes in his daughter, maternal aunt,  mother, and another family member; Drug abuse in his daughter.    ROS: All other systems are reviewed and negative. Unless otherwise mentioned in H&P    PHYSICAL EXAM: VS:  BP (!) 142/70   Pulse 65   Ht 5\' 6"  (1.676 m)   Wt 196 lb 9.6 oz (89.2 kg)   SpO2 97%   BMI 31.73 kg/m  , BMI Body mass index is 31.73 kg/m. GEN: Well nourished, well developed, in no acute distress HEENT: normal Neck: no JVD, carotid bruits, or masses Cardiac: RRR; no murmurs, rubs, or gallops,no edema  Respiratory:  Clear to auscultation bilaterally, normal work of breathing GI: soft, nontender, nondistended, + BS MS: no deformity or atrophy Skin: warm and dry, no rash Neuro:  Strength and sensation are intact Psych: euthymic mood, full affect   EKG:  EKG is ordered today. The ekg ordered today demonstrates (personally reviewed) normal sinus rhythm with a prolonged QT of 482 ms.  Heart rate of 65 bpm.   Recent Labs: 09/13/2021: B Natriuretic Peptide 596.0 16-Sep-2021: ALT 14 09/15/2021: BUN 57; Creatinine, Ser 2.15; Hemoglobin 12.2; Magnesium 2.5; Platelets 150; Potassium 4.3; Sodium 136    Lipid Panel No results found for: CHOL, TRIG, HDL, CHOLHDL, VLDL, LDLCALC, LDLDIRECT    Wt Readings from Last 3 Encounters:  09/22/21 196 lb 9.6 oz (89.2 kg)  09/15/21 195 lb 15.8 oz (88.9 kg)  05/05/21 194 lb 9.6 oz (88.3 kg)      Other studies Reviewed: Echocardiogram 09/16/21  1. Left ventricular ejection fraction, by estimation, is 40 to 45%. The  left ventricle has mildly decreased function. The left ventricle  demonstrates regional wall motion abnormalities (see scoring  diagram/findings for description). There is moderate  left ventricular hypertrophy. Left ventricular diastolic parameters are  consistent with Grade II diastolic dysfunction (pseudonormalization).   2. Right ventricular systolic function is normal. The right ventricular  size is normal. Tricuspid regurgitation signal is  inadequate for assessing  PA pressure.   3. Left atrial size was mildly dilated.   4. The mitral valve is grossly normal. Trivial mitral valve  regurgitation. No evidence of mitral stenosis.   5. The aortic valve is grossly normal. Aortic valve regurgitation is not  visualized. No aortic stenosis is present.   6. The inferior vena cava is normal in size with greater than 50%  respiratory variability, suggesting right atrial pressure of 3 mmHg.   Echocardiogram 12/20/2020 1. Mid septal and inferior basal hypokinesis . Left ventricular ejection  fraction, by estimation, is 50 to 55%. The left ventricle has low normal  function. The left ventricle has no regional wall motion abnormalities.  There is moderate left ventricular  hypertrophy. Left ventricular diastolic parameters are consistent with  Grade I diastolic dysfunction (impaired relaxation).   2. Right ventricular systolic function is normal. The right ventricular  size is normal.   3. The mitral valve is abnormal. Trivial mitral valve regurgitation. No  evidence of mitral stenosis. Moderate mitral annular calcification.   4. The aortic valve was not well visualized. There is moderate  calcification of the aortic valve. There is moderate thickening of the  aortic valve. Aortic valve regurgitation is not visualized. Mild to  moderate aortic valve sclerosis/calcification is  present, without any evidence of aortic stenosis.   5. The inferior vena cava is normal in size with greater than 50%  respiratory variability, suggesting right atrial pressure of 3 mmHg.   ASSESSMENT AND PLAN:  1.  Chronic systolic CHF: Currently euvolemic.  Blood pressure slightly elevated today.  This is not optimal for current ejection fraction.  We will follow-up on next appointment to evaluate his blood pressure at that time.  May need to consider adding Entresto 2426 to medication regimen on follow-up.  (Last creatinine on 09/15/2021 2.15) we will continue  John [name] on daily weights, additional Lasix for weight gain.  John [name] will continue carvedilol and aspirin as directed.  Close monitoring of weight and blood pressure.   I have given John [name] instructions concerning his reduced EF as well as avoiding salt and weighing daily.  John [name] is very nervous about having recurrent CHF and I have explained John [name] all of the warning signs that can occur leading to fluid overload that can be avoided with extra doses of Lasix.  John [name] verbalizes understanding.  I have given John [name] a month "salty 6"  diet instruction and a weight chart to bring with John [name] on next office visit.  John [name] is due to see Dr. [name] on previously scheduled appointment in 1 month.  I want John [name] to keep it so that John [name] can have close follow-up concerning his CHF management.  2.  Insulin-dependent diabetes: The John [name] is concerned that his diabetes is not well controlled as his blood sugars have been very labile.  Some blood sugars have been as low as 60 and then have gone high later in the day.  John [name] will continue his current medication regimen with his Lantus and glimepiride.  Dosing adjustments per PCP on follow-up appointment.  Consider SGLT2 inhibitor for cardioprotective properties, CHF, and diabetes management.  3.  Hypercholesterolemia: Remains on rosuvastatin.  John [name] will need to have some follow-up labs in 3 months if not completed by PCP.   Current medicines are reviewed at length with the John [name] today.  I have spent 25 min's  dedicated to the care of this John [name] on the date of this encounter to include pre-visit review of records, assessment, management and diagnostic testing,with shared decision making.  Labs/ tests ordered today include: None.  Follow-up lipids LFTs and BMET to be ordered. Phill Myron. [place] Pugh, ANP, AACC   09/22/2021 5:47 PM    [facility] [place] [address] Office [phone] Fax [phone]  Notice: This dictation was prepared with Dragon dictation along  with smaller phrase technology. Any transcriptional errors that result from this process are unintentional and may not be corrected upon review.

## 2021-09-22 ENCOUNTER — Encounter: Payer: Self-pay | Admitting: Adult Health

## 2021-09-22 ENCOUNTER — Ambulatory Visit (INDEPENDENT_AMBULATORY_CARE_PROVIDER_SITE_OTHER): Payer: Medicare Other | Admitting: Adult Health

## 2021-09-22 ENCOUNTER — Other Ambulatory Visit: Payer: Self-pay

## 2021-09-22 VITALS — BP 142/70 | HR 65 | Ht 66.0 in | Wt 196.6 lb

## 2021-09-22 DIAGNOSIS — Z794 Long term (current) use of insulin: Secondary | ICD-10-CM

## 2021-09-22 DIAGNOSIS — I5022 Chronic systolic (congestive) heart failure: Secondary | ICD-10-CM | POA: Diagnosis not present

## 2021-09-22 DIAGNOSIS — I25810 Atherosclerosis of coronary artery bypass graft(s) without angina pectoris: Secondary | ICD-10-CM

## 2021-09-22 DIAGNOSIS — E7800 Pure hypercholesterolemia, unspecified: Secondary | ICD-10-CM | POA: Diagnosis not present

## 2021-09-22 DIAGNOSIS — E1169 Type 2 diabetes mellitus with other specified complication: Secondary | ICD-10-CM

## 2021-09-22 DIAGNOSIS — I10 Essential (primary) hypertension: Secondary | ICD-10-CM | POA: Diagnosis not present

## 2021-09-22 NOTE — Patient Instructions (Signed)
Medication Instructions:  No Changes *If you need a refill on your cardiac medications before your next appointment, please call your pharmacy*   Lab Work: No Labs If you have labs (blood work) drawn today and your tests are completely normal, you will receive your results only by: [place] (if you have MyChart) OR A paper copy in the mail If you have any lab test that is abnormal or we need to change your treatment, we will call you to review the results.   Testing/Procedures: No Testing   Follow-Up: At [facility], you and your health needs are our priority.  As part of our continuing mission to provide you with exceptional heart care, we have created designated Provider Care Teams.  These Care Teams include your primary Cardiologist (physician) and Advanced Practice Providers (APPs -  Physician Assistants and Nurse Practitioners) who all work together to provide you with the care you need, when you need it.  We recommend signing up for the patient portal called "MyChart".  Sign up information is provided on this After Visit Summary.  MyChart is used to connect with patients for Virtual Visits (Telemedicine).  Patients are able to view lab/test results, encounter notes, upcoming appointments, etc.  Non-urgent messages can be sent to your provider as well.   To learn more about what you can do with MyChart, go to [email or web address].    Your next appointment:   November 07, 2021  The format for your next appointment:   In Person  Provider:   Kirk Ruths, MD     Other Instructions

## 2021-10-25 NOTE — Progress Notes (Deleted)
HPI:FU CAD and chronic diastolic CHF. Patient states he suffered a myocardial infarction at age 81 in [place]. He had a cardiac catheterization but medical therapy was recommended. Cardiac catheterization in June of 2013 in [place] showed EF 40% and there was hypokinesis of the anterior wall. There was a 30-40% mid right coronary artery and a 30-40% mid PDA. The left main was normal. There was an 80% proximal LAD and a 70-80% mid lesion. The circumflex had a 90% lesion after the origin of a first marginal but continued mainly as a moderate size atrial branch. The patient had PCI of his LAD with 2 Ion drug-eluting stents. Medical therapy recommended for the circumflex.  Also with peripheral vascular disease with history of femoropopliteal August 2019.  Patient admitted November 2022 with acute on chronic diastolic congestive heart failure.  He was diuresed with improvement.  Troponin mildly elevated at 34, 82, 484 and 489 felt to be demand ischemia.  Creatinine 2.15.  Echocardiogram November 2022 showed ejection fraction 40 to 45%, moderate left ventricular hypertrophy, grade 2 diastolic dysfunction, mild left atrial enlargement.  Since last seen   Current Outpatient Medications  Medication Sig Dispense Refill   albuterol (VENTOLIN HFA) 108 (90 Base) MCG/ACT inhaler Inhale 2 puffs into the lungs every 6 (six) hours as needed for wheezing or shortness of breath. 8 g 2   allopurinol (ZYLOPRIM) 300 MG tablet Take 300 mg by mouth daily.     aspirin 81 MG tablet Take 81 mg by mouth daily. Take 1 tab daily     canagliflozin (INVOKANA) 100 MG TABS tablet Take 100 mg by mouth daily.     carvedilol (COREG) 12.5 MG tablet Take 1 tablet (12.5 mg total) by mouth 2 (two) times daily with a meal. 30 tablet 5   cholecalciferol (VITAMIN D3) 25 MCG (1000 UNIT) tablet Take 1 tablet by mouth daily.     Cyanocobalamin (VITAMIN B 12 PO) Take 1 tablet by mouth daily.     furosemide (LASIX) 20 MG tablet Take 1  tablet (20 mg total) by mouth every other day. May take extra tablet as needed for 2-3 lb weight gain (Patient taking differently: Take 20 mg by mouth daily. May take extra tablet as needed for 2-3 lb weight gain) 30 tablet 2   gabapentin (NEURONTIN) 300 MG capsule Take 300 mg by mouth 2 (two) times daily.     glimepiride (AMARYL) 4 MG tablet Take 4 mg by mouth daily.     insulin glargine (LANTUS SOLOSTAR) 100 UNIT/ML Solostar Pen Inject 43 Units into the skin daily.     pantoprazole (PROTONIX) 40 MG tablet Take 40 mg by mouth daily.     rosuvastatin (CRESTOR) 20 MG tablet TAKE 1 TABLET BY MOUTH EVERY DAY IN THE EVENING (Patient taking differently: Take 20 mg by mouth daily.) 90 tablet 1   senna (SENOKOT) 8.6 MG tablet Take 1 tablet by mouth as needed for constipation.     sertraline (ZOLOFT) 25 MG tablet Take 1 tablet (25 mg total) by mouth every evening. 90 tablet 0   tamsulosin (FLOMAX) 0.4 MG CAPS capsule Take 0.4 mg by mouth every evening.     No current facility-administered medications for this visit.     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)    BPH (benign prostatic hyperplasia)    CAD (coronary artery disease)    Cholesteatoma of attic    Depression  Diabetes mellitus ([place])    Ear disease    GERD (gastroesophageal reflux disease)    Hyperlipidemia    Hypertension    PVD (peripheral vascular disease) ([place])    Stroke [facility])     Past Surgical History:  Procedure Laterality Date   BONE ANCHORED HEARING AID IMPLANT Left 06/25/2013   Procedure: BONE ANCHORED HEARING AID (BAHA) IMPLANT;  Surgeon: Fannie Knee, MD;  Location: [place];  Service: ENT;  Laterality: Left;   CAROTID STENT INSERTION Left    COCHLEAR IMPLANT     CORONARY STENT PLACEMENT     MASS EXCISION Left 06/25/2013   Procedure: EXCISION LEFT TEMPORAL MASS;  Surgeon: Fannie Knee, MD;  Location: [place];  Service: ENT;  Laterality: Left;    TONSILLECTOMY      Social History   Socioeconomic History   Marital status: Widowed    Spouse name: Not on file   Number of children: 3   Years of education: Not on file   Highest education level: Not on file  Occupational History   Not on file  Tobacco Use   Smoking status: Former    Types: Cigarettes    Quit date: 05/19/1989    Years since quitting: 32.4   Smokeless tobacco: Never  Substance and Sexual Activity   Alcohol use: Not Currently   Drug use: No   Sexual activity: Not on file  Other Topics Concern   Not on file  Social History Narrative   Not on file   Social Determinants of Health   Financial Resource Strain: Not on file  Food Insecurity: Not on file  Transportation Needs: Not on file  Physical Activity: Not on file  Stress: Not on file  Social Connections: Not on file  Intimate Partner Violence: Not on file    Family History  Problem Relation Age of Onset   Diabetes Other    Diabetes Mother    Diabetes Maternal Aunt    Depression Other    Depression Other    Drug abuse Daughter    Diabetes Daughter     ROS: no fevers or chills, productive cough, hemoptysis, dysphasia, odynophagia, melena, hematochezia, dysuria, hematuria, rash, seizure activity, orthopnea, PND, pedal edema, claudication. Remaining systems are negative.  Physical Exam: Well-developed well-nourished in no acute distress.  Skin is warm and dry.  HEENT is normal.  Neck is supple.  Chest is clear to auscultation with normal expansion.  Cardiovascular exam is regular rate and rhythm.  Abdominal exam nontender or distended. No masses palpated. Extremities show no edema. neuro grossly intact  ECG- personally reviewed  A/P  1 diastolic congestive heart failure-patient appears to be euvolemic today.  We will continue diuretic at present dose.  2 hypertension-blood pressure controlled.  Continue present medical regimen.  3 coronary artery disease-patient denies chest pain.   Continue aspirin and statin.  4 hyperlipidemia-continue Crestor.  5 peripheral vascular disease-continue medical therapy.  He is followed at [facility].  6 obstructive sleep apnea-  Kirk Ruths, MD

## 2021-11-07 ENCOUNTER — Ambulatory Visit: Payer: Medicare Other | Admitting: Cardiology

## 2021-12-14 ENCOUNTER — Other Ambulatory Visit: Payer: Self-pay

## 2021-12-14 MED ORDER — ROSUVASTATIN CALCIUM 20 MG PO TABS: 20.0000 mg | ORAL_TABLET | Freq: Every evening | ORAL | 1 refills | Status: DC

## 2021-12-24 ENCOUNTER — Emergency Department (HOSPITAL_COMMUNITY): Payer: Medicare Other

## 2021-12-24 ENCOUNTER — Encounter (HOSPITAL_COMMUNITY): Payer: Self-pay | Admitting: Emergency Medicine

## 2021-12-24 ENCOUNTER — Inpatient Hospital Stay (HOSPITAL_COMMUNITY)
Admission: EM | Admit: 2021-12-24 | Discharge: 2021-12-29 | DRG: 280 | Disposition: A | Discharge status: Home Health | Payer: Medicare Other | Attending: Internal Medicine | Admitting: Internal Medicine

## 2021-12-24 ENCOUNTER — Inpatient Hospital Stay (HOSPITAL_COMMUNITY): Payer: Medicare Other

## 2021-12-24 DIAGNOSIS — I130 Hypertensive heart and chronic kidney disease with heart failure and stage 1 through stage 4 chronic kidney disease, or unspecified chronic kidney disease: Secondary | ICD-10-CM | POA: Diagnosis present

## 2021-12-24 DIAGNOSIS — E1122 Type 2 diabetes mellitus with diabetic chronic kidney disease: Secondary | ICD-10-CM | POA: Diagnosis present

## 2021-12-24 DIAGNOSIS — E1151 Type 2 diabetes mellitus with diabetic peripheral angiopathy without gangrene: Secondary | ICD-10-CM | POA: Diagnosis present

## 2021-12-24 DIAGNOSIS — E782 Mixed hyperlipidemia: Secondary | ICD-10-CM | POA: Diagnosis present

## 2021-12-24 DIAGNOSIS — K219 Gastro-esophageal reflux disease without esophagitis: Secondary | ICD-10-CM | POA: Diagnosis present

## 2021-12-24 DIAGNOSIS — I25110 Atherosclerotic heart disease of native coronary artery with unstable angina pectoris: Secondary | ICD-10-CM | POA: Diagnosis present

## 2021-12-24 DIAGNOSIS — Z7984 Long term (current) use of oral hypoglycemic drugs: Secondary | ICD-10-CM

## 2021-12-24 DIAGNOSIS — R0902 Hypoxemia: Secondary | ICD-10-CM | POA: Diagnosis present

## 2021-12-24 DIAGNOSIS — Z8673 Personal history of transient ischemic attack (TIA), and cerebral infarction without residual deficits: Secondary | ICD-10-CM

## 2021-12-24 DIAGNOSIS — I509 Heart failure, unspecified: Secondary | ICD-10-CM

## 2021-12-24 DIAGNOSIS — D631 Anemia in chronic kidney disease: Secondary | ICD-10-CM | POA: Diagnosis present

## 2021-12-24 DIAGNOSIS — J810 Acute pulmonary edema: Secondary | ICD-10-CM | POA: Diagnosis present

## 2021-12-24 DIAGNOSIS — N1832 Chronic kidney disease, stage 3b: Secondary | ICD-10-CM | POA: Diagnosis present

## 2021-12-24 DIAGNOSIS — I25810 Atherosclerosis of coronary artery bypass graft(s) without angina pectoris: Secondary | ICD-10-CM | POA: Diagnosis present

## 2021-12-24 DIAGNOSIS — F1010 Alcohol abuse, uncomplicated: Secondary | ICD-10-CM | POA: Diagnosis present

## 2021-12-24 DIAGNOSIS — Z95820 Peripheral vascular angioplasty status with implants and grafts: Secondary | ICD-10-CM | POA: Diagnosis not present

## 2021-12-24 DIAGNOSIS — I5023 Acute on chronic systolic (congestive) heart failure: Secondary | ICD-10-CM

## 2021-12-24 DIAGNOSIS — J9611 Chronic respiratory failure with hypoxia: Secondary | ICD-10-CM | POA: Diagnosis present

## 2021-12-24 DIAGNOSIS — N401 Enlarged prostate with lower urinary tract symptoms: Secondary | ICD-10-CM | POA: Diagnosis present

## 2021-12-24 DIAGNOSIS — I255 Ischemic cardiomyopathy: Secondary | ICD-10-CM | POA: Diagnosis not present

## 2021-12-24 DIAGNOSIS — I493 Ventricular premature depolarization: Secondary | ICD-10-CM | POA: Diagnosis present

## 2021-12-24 DIAGNOSIS — Z7982 Long term (current) use of aspirin: Secondary | ICD-10-CM

## 2021-12-24 DIAGNOSIS — Z880 Allergy status to penicillin: Secondary | ICD-10-CM

## 2021-12-24 DIAGNOSIS — G4733 Obstructive sleep apnea (adult) (pediatric): Secondary | ICD-10-CM | POA: Diagnosis not present

## 2021-12-24 DIAGNOSIS — N170 Acute kidney failure with tubular necrosis: Secondary | ICD-10-CM | POA: Diagnosis not present

## 2021-12-24 DIAGNOSIS — N400 Enlarged prostate without lower urinary tract symptoms: Secondary | ICD-10-CM | POA: Diagnosis present

## 2021-12-24 DIAGNOSIS — E1140 Type 2 diabetes mellitus with diabetic neuropathy, unspecified: Secondary | ICD-10-CM | POA: Diagnosis present

## 2021-12-24 DIAGNOSIS — J9601 Acute respiratory failure with hypoxia: Secondary | ICD-10-CM | POA: Diagnosis present

## 2021-12-24 DIAGNOSIS — I10 Essential (primary) hypertension: Secondary | ICD-10-CM | POA: Diagnosis not present

## 2021-12-24 DIAGNOSIS — R7989 Other specified abnormal findings of blood chemistry: Secondary | ICD-10-CM | POA: Diagnosis present

## 2021-12-24 DIAGNOSIS — Z79899 Other long term (current) drug therapy: Secondary | ICD-10-CM

## 2021-12-24 DIAGNOSIS — Z91199 Patient's noncompliance with other medical treatment and regimen due to unspecified reason: Secondary | ICD-10-CM

## 2021-12-24 DIAGNOSIS — N179 Acute kidney failure, unspecified: Secondary | ICD-10-CM

## 2021-12-24 DIAGNOSIS — Z794 Long term (current) use of insulin: Secondary | ICD-10-CM | POA: Diagnosis not present

## 2021-12-24 DIAGNOSIS — R778 Other specified abnormalities of plasma proteins: Secondary | ICD-10-CM | POA: Diagnosis present

## 2021-12-24 DIAGNOSIS — Z888 Allergy status to other drugs, medicaments and biological substances status: Secondary | ICD-10-CM

## 2021-12-24 DIAGNOSIS — J440 Chronic obstructive pulmonary disease with acute lower respiratory infection: Secondary | ICD-10-CM | POA: Diagnosis present

## 2021-12-24 DIAGNOSIS — J180 Bronchopneumonia, unspecified organism: Secondary | ICD-10-CM | POA: Diagnosis present

## 2021-12-24 DIAGNOSIS — I214 Non-ST elevation (NSTEMI) myocardial infarction: Principal | ICD-10-CM | POA: Diagnosis present

## 2021-12-24 DIAGNOSIS — Z20822 Contact with and (suspected) exposure to covid-19: Secondary | ICD-10-CM | POA: Diagnosis present

## 2021-12-24 DIAGNOSIS — Z87891 Personal history of nicotine dependence: Secondary | ICD-10-CM

## 2021-12-24 DIAGNOSIS — I4891 Unspecified atrial fibrillation: Secondary | ICD-10-CM

## 2021-12-24 DIAGNOSIS — I447 Left bundle-branch block, unspecified: Secondary | ICD-10-CM | POA: Diagnosis present

## 2021-12-24 DIAGNOSIS — I480 Paroxysmal atrial fibrillation: Secondary | ICD-10-CM | POA: Diagnosis not present

## 2021-12-24 DIAGNOSIS — I5043 Acute on chronic combined systolic (congestive) and diastolic (congestive) heart failure: Secondary | ICD-10-CM | POA: Diagnosis present

## 2021-12-24 DIAGNOSIS — I25700 Atherosclerosis of coronary artery bypass graft(s), unspecified, with unstable angina pectoris: Secondary | ICD-10-CM | POA: Diagnosis present

## 2021-12-24 DIAGNOSIS — I739 Peripheral vascular disease, unspecified: Secondary | ICD-10-CM | POA: Diagnosis not present

## 2021-12-24 DIAGNOSIS — J9602 Acute respiratory failure with hypercapnia: Secondary | ICD-10-CM | POA: Diagnosis present

## 2021-12-24 DIAGNOSIS — T508X5A Adverse effect of diagnostic agents, initial encounter: Secondary | ICD-10-CM | POA: Diagnosis not present

## 2021-12-24 DIAGNOSIS — Z833 Family history of diabetes mellitus: Secondary | ICD-10-CM

## 2021-12-24 DIAGNOSIS — Z955 Presence of coronary angioplasty implant and graft: Secondary | ICD-10-CM

## 2021-12-24 DIAGNOSIS — I5041 Acute combined systolic (congestive) and diastolic (congestive) heart failure: Secondary | ICD-10-CM | POA: Diagnosis not present

## 2021-12-24 DIAGNOSIS — I2510 Atherosclerotic heart disease of native coronary artery without angina pectoris: Secondary | ICD-10-CM | POA: Diagnosis present

## 2021-12-24 DIAGNOSIS — N1411 Contrast-induced nephropathy: Secondary | ICD-10-CM | POA: Diagnosis not present

## 2021-12-24 DIAGNOSIS — N183 Chronic kidney disease, stage 3 unspecified: Secondary | ICD-10-CM | POA: Diagnosis present

## 2021-12-24 LAB — COMPREHENSIVE METABOLIC PANEL
ALT: 25 U/L (ref 0–44)
AST: 32 U/L (ref 15–41)
Albumin: 4.2 g/dL (ref 3.5–5.0)
Alkaline Phosphatase: 74 U/L (ref 38–126)
Anion gap: 10 (ref 5–15)
BUN: 38 mg/dL — ABNORMAL HIGH (ref 8–23)
CO2: 26 mmol/L (ref 22–32)
Calcium: 9 mg/dL (ref 8.9–10.3)
Chloride: 101 mmol/L (ref 98–111)
Creatinine, Ser: 2.52 mg/dL — ABNORMAL HIGH (ref 0.61–1.24)
GFR, Estimated: 25 mL/min — ABNORMAL LOW (ref >60)
Glucose, Bld: 263 mg/dL — ABNORMAL HIGH (ref 70–99)
Potassium: 4.2 mmol/L (ref 3.5–5.1)
Sodium: 137 mmol/L (ref 135–145)
Total Bilirubin: 0.9 mg/dL (ref 0.3–1.2)
Total Protein: 8.4 g/dL — ABNORMAL HIGH (ref 6.5–8.1)

## 2021-12-24 LAB — BLOOD GAS, VENOUS
Acid-base deficit: 0.7 mmol/L (ref 0.0–2.0)
Bicarbonate: 28.5 mmol/L — ABNORMAL HIGH (ref 20.0–28.0)
Drawn by: 5678
FIO2: 80 %
O2 Saturation: 45.2 %
Patient temperature: 35.9
pCO2, Ven: 65 mmHg — ABNORMAL HIGH (ref 44–60)
pH, Ven: 7.24 — ABNORMAL LOW (ref 7.25–7.43)
pO2, Ven: 31 mmHg — CL (ref 32–45)

## 2021-12-24 LAB — LIPID PANEL
Cholesterol: 99 mg/dL (ref 0–200)
HDL: 51 mg/dL (ref >40)
LDL Cholesterol: 29 mg/dL (ref 0–99)
Total CHOL/HDL Ratio: 1.9 RATIO
Triglycerides: 95 mg/dL (ref <150)
VLDL: 19 mg/dL (ref 0–40)

## 2021-12-24 LAB — URINALYSIS, ROUTINE W REFLEX MICROSCOPIC
Bilirubin Urine: NEGATIVE
Glucose, UA: >=500 mg/dL — AB
Ketones, ur: NEGATIVE mg/dL
Leukocytes,Ua: NEGATIVE
Nitrite: NEGATIVE
Protein, ur: 100 mg/dL — AB
Specific Gravity, Urine: 1.011 (ref 1.005–1.030)
pH: 6 (ref 5.0–8.0)

## 2021-12-24 LAB — GLUCOSE, CAPILLARY
Glucose-Capillary: 112 mg/dL — ABNORMAL HIGH (ref 70–99)
Glucose-Capillary: 120 mg/dL — ABNORMAL HIGH (ref 70–99)

## 2021-12-24 LAB — CBC
HCT: 48.2 % (ref 39.0–52.0)
Hemoglobin: 14.9 g/dL (ref 13.0–17.0)
MCH: 29.3 pg (ref 26.0–34.0)
MCHC: 30.9 g/dL (ref 30.0–36.0)
MCV: 94.9 fL (ref 80.0–100.0)
Platelets: 179 10*3/uL (ref 150–400)
RBC: 5.08 MIL/uL (ref 4.22–5.81)
RDW: 16 % — ABNORMAL HIGH (ref 11.5–15.5)
WBC: 13.3 10*3/uL — ABNORMAL HIGH (ref 4.0–10.5)
nRBC: 0.4 % — ABNORMAL HIGH (ref 0.0–0.2)

## 2021-12-24 LAB — TROPONIN I (HIGH SENSITIVITY)
Troponin I (High Sensitivity): 39 ng/L — ABNORMAL HIGH (ref <18)
Troponin I (High Sensitivity): 313 ng/L (ref <18)
Troponin I (High Sensitivity): >24000 ng/L (ref <18)

## 2021-12-24 LAB — MAGNESIUM: Magnesium: 2.2 mg/dL (ref 1.7–2.4)

## 2021-12-24 LAB — RESP PANEL BY RT-PCR (FLU A&B, COVID) ARPGX2
Influenza A by PCR: NEGATIVE
Influenza B by PCR: NEGATIVE
SARS Coronavirus 2 by RT PCR: NEGATIVE

## 2021-12-24 LAB — HEMOGLOBIN A1C
Hgb A1c MFr Bld: 7.1 % — ABNORMAL HIGH (ref 4.8–5.6)
Mean Plasma Glucose: 157.07 mg/dL

## 2021-12-24 LAB — BLOOD GAS, ARTERIAL
Acid-Base Excess: 2.2 mmol/L — ABNORMAL HIGH (ref 0.0–2.0)
Acid-base deficit: 1.9 mmol/L (ref 0.0–2.0)
Bicarbonate: 28.4 mmol/L — ABNORMAL HIGH (ref 20.0–28.0)
Bicarbonate: 27.3 mmol/L (ref 20.0–28.0)
Drawn by: 38235
Drawn by: 22179
FIO2: 65 %
FIO2: 44 %
O2 Saturation: 95.7 %
O2 Saturation: 96.3 %
Patient temperature: 37
Patient temperature: 36.4
pCO2 arterial: 76 mmHg (ref 32–48)
pCO2 arterial: 42 mmHg (ref 32–48)
pH, Arterial: 7.18 — CL (ref 7.35–7.45)
pH, Arterial: 7.42 (ref 7.35–7.45)
pO2, Arterial: 90 mmHg (ref 83–108)
pO2, Arterial: 77 mmHg — ABNORMAL LOW (ref 83–108)

## 2021-12-24 LAB — CBG MONITORING, ED
Glucose-Capillary: 254 mg/dL — ABNORMAL HIGH (ref 70–99)
Glucose-Capillary: 197 mg/dL — ABNORMAL HIGH (ref 70–99)

## 2021-12-24 LAB — BRAIN NATRIURETIC PEPTIDE: B Natriuretic Peptide: 480 pg/mL — ABNORMAL HIGH (ref 0.0–100.0)

## 2021-12-24 LAB — LACTIC ACID, PLASMA
Lactic Acid, Venous: 2.1 mmol/L (ref 0.5–1.9)
Lactic Acid, Venous: 1 mmol/L (ref 0.5–1.9)

## 2021-12-24 LAB — TSH: TSH: 2.221 u[IU]/mL (ref 0.350–4.500)

## 2021-12-24 LAB — D-DIMER, QUANTITATIVE: D-Dimer, Quant: 2.19 ug/mL-FEU — ABNORMAL HIGH (ref 0.00–0.50)

## 2021-12-24 MED ORDER — TECHNETIUM TO 99M ALBUMIN AGGREGATED
4.4000 | Freq: Once | INTRAVENOUS | Status: AC | PRN
  Administered 2021-12-24: 4.4 via INTRAVENOUS

## 2021-12-24 MED ORDER — HEPARIN (PORCINE) 25000 UT/250ML-% IV SOLN
1500.0000 [IU]/h | INTRAVENOUS | Status: DC
  Administered 2021-12-24: 17:00:00 1000 [IU]/h via INTRAVENOUS
  Administered 2021-12-25: 1300 [IU]/h via INTRAVENOUS
  Filled 2021-12-24 (×2): qty 250

## 2021-12-24 MED ORDER — ALLOPURINOL 300 MG PO TABS
300.0000 mg | ORAL_TABLET | Freq: Every day | ORAL | Status: DC
  Administered 2021-12-25 – 2021-12-26 (×2): 300 mg via ORAL
  Filled 2021-12-24 (×2): qty 1

## 2021-12-24 MED ORDER — ONDANSETRON HCL 4 MG PO TABS: 4.0000 mg | ORAL_TABLET | Freq: Four times a day (QID) | ORAL | Status: DC | PRN

## 2021-12-24 MED ORDER — NITROGLYCERIN IN D5W 200-5 MCG/ML-% IV SOLN: 5.0000 ug/min | INTRAVENOUS | Status: DC

## 2021-12-24 MED ORDER — HYDROMORPHONE HCL 1 MG/ML IJ SOLN
0.5000 mg | INTRAMUSCULAR | Status: DC | PRN
  Administered 2021-12-24 – 2021-12-28 (×3): 1 mg via INTRAVENOUS
  Filled 2021-12-24 (×3): qty 1

## 2021-12-24 MED ORDER — SODIUM CHLORIDE 0.9% FLUSH
3.0000 mL | Freq: Two times a day (BID) | INTRAVENOUS | Status: DC
  Administered 2021-12-24 – 2021-12-29 (×7): 3 mL via INTRAVENOUS

## 2021-12-24 MED ORDER — SODIUM CHLORIDE 0.9% FLUSH
3.0000 mL | Freq: Two times a day (BID) | INTRAVENOUS | Status: DC
  Administered 2021-12-24 – 2021-12-27 (×6): 3 mL via INTRAVENOUS

## 2021-12-24 MED ORDER — FUROSEMIDE 10 MG/ML IJ SOLN: 40.0000 mg | Freq: Once | INTRAMUSCULAR | Status: AC

## 2021-12-24 MED ORDER — HEPARIN SODIUM (PORCINE) 5000 UNIT/ML IJ SOLN: 5000.0000 [IU] | Freq: Three times a day (TID) | INTRAMUSCULAR | Status: DC

## 2021-12-24 MED ORDER — LEVALBUTEROL HCL 0.63 MG/3ML IN NEBU
INHALATION_SOLUTION | RESPIRATORY_TRACT | Status: AC
  Administered 2021-12-24: 0.63 mg
  Filled 2021-12-24: qty 3

## 2021-12-24 MED ORDER — NITROGLYCERIN IN D5W 200-5 MCG/ML-% IV SOLN
INTRAVENOUS | Status: AC
  Administered 2021-12-24: 5 ug/min via INTRAVENOUS
  Filled 2021-12-24: qty 250

## 2021-12-24 MED ORDER — FUROSEMIDE 10 MG/ML IJ SOLN
40.0000 mg | Freq: Two times a day (BID) | INTRAMUSCULAR | Status: DC
Start: 2021-12-24 — End: 2021-12-25
  Administered 2021-12-24 – 2021-12-25 (×3): 40 mg via INTRAVENOUS
  Filled 2021-12-24 (×3): qty 4

## 2021-12-24 MED ORDER — LEVOFLOXACIN IN D5W 750 MG/150ML IV SOLN
750.0000 mg | Freq: Once | INTRAVENOUS | Status: DC
  Administered 2021-12-24: 750 mg via INTRAVENOUS
  Filled 2021-12-24: qty 150

## 2021-12-24 MED ORDER — PANTOPRAZOLE SODIUM 40 MG PO TBEC
40.0000 mg | DELAYED_RELEASE_TABLET | Freq: Every day | ORAL | Status: DC
  Administered 2021-12-25 – 2021-12-29 (×5): 40 mg via ORAL
  Filled 2021-12-24 (×5): qty 1

## 2021-12-24 MED ORDER — INSULIN ASPART 100 UNIT/ML IJ SOLN
0.0000 [IU] | Freq: Three times a day (TID) | INTRAMUSCULAR | Status: DC
  Administered 2021-12-24: 4 [IU] via SUBCUTANEOUS
  Administered 2021-12-25: 7 [IU] via SUBCUTANEOUS
  Administered 2021-12-25 (×2): 4 [IU] via SUBCUTANEOUS
  Administered 2021-12-26: 7 [IU] via SUBCUTANEOUS
  Administered 2021-12-26 – 2021-12-27 (×3): 11 [IU] via SUBCUTANEOUS
  Administered 2021-12-27: 4 [IU] via SUBCUTANEOUS
  Administered 2021-12-27: 7 [IU] via SUBCUTANEOUS
  Administered 2021-12-28: 17:00:00 4 [IU] via SUBCUTANEOUS
  Filled 2021-12-24: qty 1

## 2021-12-24 MED ORDER — TRAZODONE HCL 50 MG PO TABS
25.0000 mg | ORAL_TABLET | Freq: Every evening | ORAL | Status: DC | PRN
  Administered 2021-12-24 – 2021-12-28 (×3): 25 mg via ORAL
  Filled 2021-12-24 (×3): qty 1

## 2021-12-24 MED ORDER — ACETAMINOPHEN 650 MG RE SUPP: 650.0000 mg | Freq: Four times a day (QID) | RECTAL | Status: DC | PRN

## 2021-12-24 MED ORDER — INSULIN GLARGINE-YFGN 100 UNIT/ML ~~LOC~~ SOLN
43.0000 [IU] | Freq: Every day | SUBCUTANEOUS | Status: DC
  Administered 2021-12-24 – 2021-12-27 (×4): 43 [IU] via SUBCUTANEOUS
  Filled 2021-12-24 (×6): qty 0.43

## 2021-12-24 MED ORDER — ASPIRIN 325 MG PO TABS
325.0000 mg | ORAL_TABLET | Freq: Once | ORAL | Status: AC
  Administered 2021-12-24: 325 mg via ORAL
  Filled 2021-12-24: qty 1

## 2021-12-24 MED ORDER — METHYLPREDNISOLONE SODIUM SUCC 125 MG IJ SOLR: 125.0000 mg | Freq: Two times a day (BID) | INTRAMUSCULAR | Status: DC

## 2021-12-24 MED ORDER — ROSUVASTATIN CALCIUM 20 MG PO TABS
20.0000 mg | ORAL_TABLET | Freq: Every evening | ORAL | Status: DC
Start: 2021-12-24 — End: 2021-12-29
  Administered 2021-12-24 – 2021-12-28 (×5): 20 mg via ORAL
  Filled 2021-12-24 (×5): qty 1

## 2021-12-24 MED ORDER — IPRATROPIUM BROMIDE 0.02 % IN SOLN: 0.5000 mg | Freq: Four times a day (QID) | RESPIRATORY_TRACT | Status: DC | PRN

## 2021-12-24 MED ORDER — SENNOSIDES-DOCUSATE SODIUM 8.6-50 MG PO TABS: 1.0000 | ORAL_TABLET | Freq: Every evening | ORAL | Status: DC | PRN

## 2021-12-24 MED ORDER — ONDANSETRON HCL 4 MG/2ML IJ SOLN
4.0000 mg | Freq: Once | INTRAMUSCULAR | Status: AC
  Administered 2021-12-24: 4 mg via INTRAVENOUS
  Filled 2021-12-24: qty 2

## 2021-12-24 MED ORDER — TAMSULOSIN HCL 0.4 MG PO CAPS
0.4000 mg | ORAL_CAPSULE | Freq: Every evening | ORAL | Status: DC
  Administered 2021-12-24 – 2021-12-28 (×5): 0.4 mg via ORAL
  Filled 2021-12-24 (×5): qty 1

## 2021-12-24 MED ORDER — SERTRALINE HCL 25 MG PO TABS
25.0000 mg | ORAL_TABLET | Freq: Every evening | ORAL | Status: DC
  Administered 2021-12-24 – 2021-12-28 (×5): 25 mg via ORAL
  Filled 2021-12-24 (×5): qty 1

## 2021-12-24 MED ORDER — INSULIN ASPART 100 UNIT/ML IJ SOLN
0.0000 [IU] | Freq: Three times a day (TID) | INTRAMUSCULAR | Status: DC
  Administered 2021-12-24: 5 [IU] via SUBCUTANEOUS
  Filled 2021-12-24: qty 1

## 2021-12-24 MED ORDER — LEVALBUTEROL HCL 0.63 MG/3ML IN NEBU: 0.6300 mg | INHALATION_SOLUTION | Freq: Four times a day (QID) | RESPIRATORY_TRACT | Status: DC | PRN

## 2021-12-24 MED ORDER — LEVOFLOXACIN IN D5W 500 MG/100ML IV SOLN
500.0000 mg | INTRAVENOUS | Status: DC
  Administered 2021-12-26 – 2021-12-28 (×2): 500 mg via INTRAVENOUS
  Filled 2021-12-24 (×2): qty 100

## 2021-12-24 MED ORDER — SODIUM CHLORIDE 0.9 % IV SOLN
250.0000 mL | INTRAVENOUS | Status: DC | PRN
  Administered 2021-12-24: 250 mL via INTRAVENOUS

## 2021-12-24 MED ORDER — IPRATROPIUM BROMIDE 0.02 % IN SOLN
RESPIRATORY_TRACT | Status: AC
  Administered 2021-12-24: 0.5 mg
  Filled 2021-12-24: qty 2.5

## 2021-12-24 MED ORDER — VITAMIN D 25 MCG (1000 UNIT) PO TABS
1000.0000 [IU] | ORAL_TABLET | Freq: Every day | ORAL | Status: DC
  Administered 2021-12-25 – 2021-12-29 (×5): 1000 [IU] via ORAL
  Filled 2021-12-24 (×5): qty 1

## 2021-12-24 MED ORDER — METHYLPREDNISOLONE SODIUM SUCC 125 MG IJ SOLR
80.0000 mg | Freq: Two times a day (BID) | INTRAMUSCULAR | Status: DC
  Administered 2021-12-24 – 2021-12-26 (×4): 80 mg via INTRAVENOUS
  Filled 2021-12-24 (×4): qty 2

## 2021-12-24 MED ORDER — FUROSEMIDE 10 MG/ML IJ SOLN
INTRAMUSCULAR | Status: AC
  Administered 2021-12-24: 40 mg via INTRAVENOUS
  Filled 2021-12-24: qty 4

## 2021-12-24 MED ORDER — KETOROLAC TROMETHAMINE 15 MG/ML IJ SOLN
15.0000 mg | Freq: Once | INTRAMUSCULAR | Status: AC
  Administered 2021-12-24: 15 mg via INTRAVENOUS
  Filled 2021-12-24: qty 1

## 2021-12-24 MED ORDER — BISACODYL 5 MG PO TBEC: 5.0000 mg | DELAYED_RELEASE_TABLET | Freq: Every day | ORAL | Status: DC | PRN

## 2021-12-24 MED ORDER — OXYCODONE HCL 5 MG PO TABS
5.0000 mg | ORAL_TABLET | ORAL | Status: DC | PRN
  Administered 2021-12-24 – 2021-12-29 (×9): 5 mg via ORAL
  Filled 2021-12-24 (×9): qty 1

## 2021-12-24 MED ORDER — CARVEDILOL 12.5 MG PO TABS
12.5000 mg | ORAL_TABLET | Freq: Two times a day (BID) | ORAL | Status: DC
  Administered 2021-12-24 – 2021-12-29 (×10): 12.5 mg via ORAL
  Filled 2021-12-24 (×10): qty 1

## 2021-12-24 MED ORDER — HYDRALAZINE HCL 20 MG/ML IJ SOLN: 10.0000 mg | INTRAMUSCULAR | Status: DC | PRN

## 2021-12-24 MED ORDER — ASPIRIN 81 MG PO CHEW
81.0000 mg | CHEWABLE_TABLET | Freq: Every day | ORAL | Status: DC
  Administered 2021-12-25 – 2021-12-28 (×4): 81 mg via ORAL
  Filled 2021-12-24 (×4): qty 1

## 2021-12-24 MED ORDER — ACETAMINOPHEN 325 MG PO TABS: 650.0000 mg | ORAL_TABLET | Freq: Four times a day (QID) | ORAL | Status: DC | PRN

## 2021-12-24 MED ORDER — ONDANSETRON HCL 4 MG/2ML IJ SOLN: 4.0000 mg | Freq: Four times a day (QID) | INTRAMUSCULAR | Status: DC | PRN

## 2021-12-24 MED ORDER — GABAPENTIN 300 MG PO CAPS
300.0000 mg | ORAL_CAPSULE | Freq: Two times a day (BID) | ORAL | Status: DC
  Administered 2021-12-24 – 2021-12-27 (×6): 300 mg via ORAL
  Filled 2021-12-24 (×6): qty 1

## 2021-12-24 MED ORDER — HEPARIN BOLUS VIA INFUSION
4000.0000 [IU] | Freq: Once | INTRAVENOUS | Status: AC
  Administered 2021-12-24: 4000 [IU] via INTRAVENOUS

## 2021-12-24 NOTE — ED Notes (Signed)
Dr. Pearline Cables aware of troponin level.  ?

## 2021-12-24 NOTE — ED Notes (Signed)
ED TO INPATIENT HANDOFF REPORT  ED Nurse Name and Phone #: [phone]  S Name/Age/Gender Tama High 81 y.o. male Room/Bed: APA06/APA06  Code Status   Code Status: Full Code  Home/SNF/Other Home Patient oriented to: self, place, time, and situation Is this baseline? Yes   Triage Complete: Triage complete  Chief Complaint Hypoxia [R09.02] NSTEMI (non-ST elevated myocardial infarction) [facility]) [I21.4]  Triage Note Pt brought in by [facility] in resp distress. Pt arrive with NRB 15L with sats of 89%.    Allergies Allergies  Allergen Reactions   Penicillins Hives   Ticagrelor Shortness Of Breath   Ezetimibe-Simvastatin Other (See Comments)    Myalgia     Level of Care/Admitting Diagnosis ED Disposition     ED Disposition  Admit   Condition  --   Comment  Hospital Area: [place] [[id number]]  Level of Care: Progressive [102]  Admit to Progressive based on following criteria: CARDIOVASCULAR & THORACIC of moderate stability with acute coronary syndrome symptoms/low risk myocardial infarction/hypertensive urgency/arrhythmias/heart failure potentially compromising stability and stable post cardiovascular intervention patients.  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)    BPH (benign prostatic hyperplasia)    CAD (coronary artery disease)    Cholesteatoma of attic    Depression    Diabetes mellitus ([place])    Ear disease    GERD (gastroesophageal reflux  disease)    Hyperlipidemia    Hypertension    PVD (peripheral vascular disease) ([place])    Stroke [facility])    Past Surgical History:  Procedure Laterality Date   BONE ANCHORED HEARING AID IMPLANT Left 06/25/2013   Procedure: BONE ANCHORED HEARING AID (BAHA) IMPLANT;  Surgeon: Fannie Knee, MD;  Location: [place];  Service: ENT;  Laterality: Left;   CAROTID STENT INSERTION Left    COCHLEAR IMPLANT     CORONARY STENT PLACEMENT     MASS EXCISION Left 06/25/2013   Procedure: EXCISION LEFT TEMPORAL MASS;  Surgeon: Fannie Knee, MD;  Location: [place];  Service: ENT;  Laterality: Left;   TONSILLECTOMY       A IV Location/Drains/Wounds Patient Lines/Drains/Airways Status     Active Line/Drains/Airways     Name Placement date Placement time Site Days   Peripheral IV 09/13/21 18 G Anterior;Proximal;Right Forearm 09/13/21  0214  Forearm  102   Peripheral IV 12/24/21 20 G Posterior;Right Hand 12/24/21  0800  Hand  less than 1   Urethral Catheter Toni RN Coude 16 Fr. 12/24/21  0551  Coude  less than 1            Intake/Output Last 24 hours  Intake/Output Summary (Last 24 hours) at 12/24/2021 1704 Last data filed at 12/24/2021 1700 Gross per 24 hour  Intake 88.5 ml  Output --  Net 88.5 ml    Labs/Imaging Results for orders placed or performed during the hospital encounter of 12/24/21 (from the past 48 hour(s))  Brain natriuretic peptide  Status: Abnormal   Collection Time: 12/24/21  4:19 AM  Result Value Ref Range   B Natriuretic Peptide 480.0 (H) 0.0 - 100.0 pg/mL    Comment: Performed at [facility], [address] [place], [place] [address]  Comprehensive metabolic panel     Status: Abnormal   Collection Time: 12/24/21  4:19 AM  Result Value Ref Range   Sodium 137 135 - 145 mmol/L   Potassium 4.2 3.5 - 5.1 mmol/L   Chloride 101 98 - 111 mmol/L   CO2 26 22 - 32 mmol/L   Glucose, Bld 263 (H) 70 - 99 mg/dL    Comment: Glucose reference range  applies only to samples taken after fasting for at least 8 hours.   BUN 38 (H) 8 - 23 mg/dL   Creatinine, Ser 2.52 (H) 0.61 - 1.24 mg/dL   Calcium 9.0 8.9 - 10.3 mg/dL   Total Protein 8.4 (H) 6.5 - 8.1 g/dL   Albumin 4.2 3.5 - 5.0 g/dL   AST 32 15 - 41 U/L   ALT 25 0 - 44 U/L   Alkaline Phosphatase 74 38 - 126 U/L   Total Bilirubin 0.9 0.3 - 1.2 mg/dL   GFR, Estimated 25 (L) >60 mL/min    Comment: (NOTE) Calculated using the CKD-EPI Creatinine Equation (2021)    Anion gap 10 5 - 15    Comment: Performed at [facility], [address], [place], [place] [address]  CBC     Status: Abnormal   Collection Time: 12/24/21  4:19 AM  Result Value Ref Range   WBC 13.3 (H) 4.0 - 10.5 K/uL   RBC 5.08 4.22 - 5.81 MIL/uL   Hemoglobin 14.9 13.0 - 17.0 g/dL   HCT 48.2 39.0 - 52.0 %   MCV 94.9 80.0 - 100.0 fL   MCH 29.3 26.0 - 34.0 pg   MCHC 30.9 30.0 - 36.0 g/dL   RDW 16.0 (H) 11.5 - 15.5 %   Platelets 179 150 - 400 K/uL   nRBC 0.4 (H) 0.0 - 0.2 %    Comment: Performed at [facility], [address], [place], [place] [address]  Lactic acid, plasma     Status: Abnormal   Collection Time: 12/24/21  4:19 AM  Result Value Ref Range   Lactic Acid, Venous 2.1 (HH) 0.5 - 1.9 mmol/L    Comment: CRITICAL RESULT CALLED TO, READ BACK BY AND VERIFIED WITH: NICKOLS,K @ 6440 ON 12/24/21 BY JUW Performed at [facility], [address] [place], [place] [address]   Troponin I (High Sensitivity)     Status: Abnormal   Collection Time: 12/24/21  4:19 AM  Result Value Ref Range   Troponin I (High Sensitivity) 39 (H) <18 ng/L    Comment: (NOTE) Elevated high sensitivity troponin I (hsTnI) values and significant  changes across serial measurements may suggest ACS but many other  chronic and acute conditions are known to elevate hsTnI results.  Refer to the "Links" section for chest pain algorithms and additional  guidance. Performed at [facility], [address], [place], [place] [address]   D-dimer,  quantitative     Status: Abnormal   Collection Time: 12/24/21  4:19 AM  Result Value Ref Range   D-Dimer, Quant 2.19 (H) 0.00 - 0.50 ug/mL-FEU    Comment: (NOTE) At the manufacturer cut-off value of 0.5 g/mL FEU, this assay has a negative predictive value of 95-100%.This assay is intended for use in conjunction with a clinical pretest probability (PTP)  assessment model to exclude pulmonary embolism (PE) and deep venous thrombosis (DVT) in outpatients suspected of PE or DVT. Results should be correlated with clinical presentation. Performed at [facility], [address], [place], [place] [address]   Resp Panel by RT-PCR (Flu A&B, Covid) Nasopharyngeal Swab     Status: None   Collection Time: 12/24/21  4:27 AM   Specimen: Nasopharyngeal Swab; Nasopharyngeal(NP) swabs in vial transport medium  Result Value Ref Range   SARS Coronavirus 2 by RT PCR NEGATIVE NEGATIVE    Comment: (NOTE) SARS-CoV-2 target nucleic acids are NOT DETECTED.  The SARS-CoV-2 RNA is generally detectable in upper respiratory specimens during the acute phase of infection. The lowest concentration of SARS-CoV-2 viral copies this assay can detect is 138 copies/mL. A negative result does not preclude SARS-Cov-2 infection and should not be used as the sole basis for treatment or other patient management decisions. A negative result may occur with  improper specimen collection/handling, submission of specimen other than nasopharyngeal swab, presence of viral mutation(s) within the areas targeted by this assay, and inadequate number of viral copies(<138 copies/mL). A negative result must be combined with clinical observations, patient history, and epidemiological information. The expected result is Negative.  Fact Sheet for Patients:  EntrepreneurPulse.com.au  Fact Sheet for Healthcare Providers:  IncredibleEmployment.be  This test is no t yet approved or cleared by the [place] FDA and  has been authorized for detection and/or diagnosis of SARS-CoV-2 by FDA under an Emergency Use Authorization (EUA). This EUA will remain  in effect (meaning this test can be used) for the duration of the COVID-19 declaration under Section 564(b)(1) of the Act, 21 U.S.C.section 360bbb-3(b)(1), unless the authorization is terminated  or revoked sooner.       Influenza A by PCR NEGATIVE NEGATIVE   Influenza B by PCR NEGATIVE NEGATIVE    Comment: (NOTE) The Xpert Xpress SARS-CoV-2/FLU/RSV plus assay is intended as an aid in the diagnosis of influenza from Nasopharyngeal swab specimens and should not be used as a sole basis for treatment. Nasal washings and aspirates are unacceptable for Xpert Xpress SARS-CoV-2/FLU/RSV testing.  Fact Sheet for Patients: EntrepreneurPulse.com.au  Fact Sheet for Healthcare Providers: IncredibleEmployment.be  This test is not yet approved or cleared by the [place] FDA and has been authorized for detection and/or diagnosis of SARS-CoV-2 by FDA under an Emergency Use Authorization (EUA). This EUA will remain in effect (meaning this test can be used) for the duration of the COVID-19 declaration under Section 564(b)(1) of the Act, 21 U.S.C. section 360bbb-3(b)(1), unless the authorization is terminated or revoked.  Performed at [facility], [address], [place], [place] [address]   Blood gas, arterial (at [facility])     Status: Abnormal   Collection Time: 12/24/21  4:31 AM  Result Value Ref Range   FIO2 65.00 %   pH, Arterial 7.18 (LL) 7.35 - 7.45    Comment: CRITICAL RESULT CALLED TO, READ BACK BY AND VERIFIED WITH: SHREVE,C @ 0445 ON 12/24/21 BY JUW    pCO2 arterial 76 (HH) 32 - 48 mmHg    Comment: CRITICAL RESULT CALLED TO, READ BACK BY AND VERIFIED WITH: SHREVE,C @ 0445 ON 12/24/21 BY JUWW    pO2, Arterial 90 83 - 108 mmHg   Bicarbonate 28.4 (H) 20.0 - 28.0 mmol/L   Acid-base deficit 1.9  0.0 - 2.0 mmol/L   O2 Saturation 95.7 %   Patient temperature 37.0    Collection site RIGHT RADIAL  Drawn by [id number]    Allens test (pass/fail) PASS PASS    Comment: Performed at [facility], [address], [place], [place] [address]  Urinalysis, Routine w reflex microscopic Urine, Catheterized     Status: Abnormal   Collection Time: 12/24/21  5:53 AM  Result Value Ref Range   Color, Urine YELLOW YELLOW   APPearance CLEAR CLEAR   Specific Gravity, Urine 1.011 1.005 - 1.030   pH 6.0 5.0 - 8.0   Glucose, UA >=500 (A) NEGATIVE mg/dL   Hgb urine dipstick SMALL (A) NEGATIVE   Bilirubin Urine NEGATIVE NEGATIVE   Ketones, ur NEGATIVE NEGATIVE mg/dL   Protein, ur 100 (A) NEGATIVE mg/dL   Nitrite NEGATIVE NEGATIVE   Leukocytes,Ua NEGATIVE NEGATIVE   RBC / HPF 0-5 0 - 5 RBC/hpf   WBC, UA 0-5 0 - 5 WBC/hpf   Bacteria, UA RARE (A) NONE SEEN   Squamous Epithelial / LPF 0-5 0 - 5   Mucus PRESENT     Comment: Performed at [facility], [address] [place], [place] [address]  Lactic acid, plasma     Status: None   Collection Time: 12/24/21  6:35 AM  Result Value Ref Range   Lactic Acid, Venous 1.0 0.5 - 1.9 mmol/L    Comment: Performed at [facility], [address], [place], [place] [address]  Troponin I (High Sensitivity)     Status: Abnormal   Collection Time: 12/24/21  6:35 AM  Result Value Ref Range   Troponin I (High Sensitivity) 313 (HH) <18 ng/L    Comment: CRITICAL RESULT CALLED TO, READ BACK BY AND VERIFIED WITH:  C.BAIN @ 8453 12/24/21 BY STEPHTR (NOTE) Elevated high sensitivity troponin I (hsTnI) values and significant  changes across serial measurements may suggest ACS but many other  chronic and acute conditions are known to elevate hsTnI results.  Refer to the Links section for chest pain algorithms and additional  guidance. Performed at [facility], [address] [place], [place] [address]   Blood gas, venous     Status: Abnormal   Collection Time: 12/24/21  7:46  AM  Result Value Ref Range   FIO2 80.00 %   pH, Ven 7.24 (L) 7.25 - 7.43   pCO2, Ven 65 (H) 44 - 60 mmHg   pO2, Ven 31 (LL) 32 - 45 mmHg    Comment: CRITICAL RESULT CALLED TO, READ BACK BY AND VERIFIED WITH: LONG J @ 0804 ON [id number] BY HENDERSON L    Bicarbonate 28.5 (H) 20.0 - 28.0 mmol/L   Acid-base deficit 0.7 0.0 - 2.0 mmol/L   O2 Saturation 45.2 %   Patient temperature 35.9    Collection site LEFT ANTECUBITAL    Drawn by [id number]     Comment: Performed at [facility], [address] [place], [place] [address]  Hemoglobin A1c     Status: Abnormal   Collection Time: 12/24/21  7:46 AM  Result Value Ref Range   Hgb A1c MFr Bld 7.1 (H) 4.8 - 5.6 %    Comment: (NOTE) Pre diabetes:          5.7%-6.4%  Diabetes:              >6.4%  Glycemic control for   <7.0% adults with diabetes    Mean Plasma Glucose 157.07 mg/dL    Comment: Performed at [facility] [address] [place], [place] [address]  Culture, blood (routine x 2)     Status: None (Preliminary  result)   Collection Time: 12/24/21  8:05 AM   Specimen: Left Antecubital; Blood  Result Value Ref Range   Specimen Description      LEFT ANTECUBITAL BOTTLES DRAWN AEROBIC AND ANAEROBIC   Special Requests      Blood Culture adequate volume Performed at [facility], [address], [place], [place] [address]    Culture PENDING    Report Status PENDING   Culture, blood (routine x 2)     Status: None (Preliminary result)   Collection Time: 12/24/21  8:07 AM   Specimen: Left Antecubital; Blood  Result Value Ref Range   Specimen Description      BLOOD RIGHT HAND BOTTLES DRAWN AEROBIC AND ANAEROBIC   Special Requests      Blood Culture adequate volume Performed at [facility], [address], [place], [place] [address]    Culture PENDING    Report Status PENDING   CBG monitoring, ED     Status: Abnormal   Collection Time: 12/24/21  8:15 AM  Result Value Ref Range   Glucose-Capillary 254 (H) 70 - 99 mg/dL     Comment: Glucose reference range applies only to samples taken after fasting for at least 8 hours.  CBG monitoring, ED     Status: Abnormal   Collection Time: 12/24/21  1:12 PM  Result Value Ref Range   Glucose-Capillary 197 (H) 70 - 99 mg/dL    Comment: Glucose reference range applies only to samples taken after fasting for at least 8 hours.  Troponin I (High Sensitivity)     Status: Abnormal   Collection Time: 12/24/21  2:33 PM  Result Value Ref Range   Troponin I (High Sensitivity) >24,000 (HH) <18 ng/L    Comment: CRITICAL RESULT CALLED TO, READ BACK BY AND VERIFIED WITH: Benedetto Goad @ [id number] 12/24/21 BY STEPHTR DELTA CHECK NOTED DCTROP Performed at [facility], [address] [place], [place] [address]   Blood gas, arterial     Status: Abnormal   Collection Time: 12/24/21  2:58 PM  Result Value Ref Range   FIO2 44 %   pH, Arterial 7.42 7.35 - 7.45   pCO2 arterial 42 32 - 48 mmHg   pO2, Arterial 77 (L) 83 - 108 mmHg   Bicarbonate 27.3 20.0 - 28.0 mmol/L   Acid-Base Excess 2.2 (H) 0.0 - 2.0 mmol/L   O2 Saturation 96.3 %   Patient temperature 36.4    Collection site LEFT RADIAL    Drawn by [id number]    Allens test (pass/fail) PASS PASS    Comment: Performed at [facility], [address] [place], [place] [address]   NM Pulmonary Perfusion  Result Date: 12/24/2021 CLINICAL DATA:  81 year old male with respiratory distress, shortness of breath. EXAM: NUCLEAR MEDICINE PERFUSION LUNG SCAN TECHNIQUE: Perfusion images were obtained in multiple projections after intravenous injection of radiopharmaceutical. Ventilation scans intentionally deferred if perfusion scan and chest x-ray adequate for interpretation during COVID 19 epidemic. RADIOPHARMACEUTICALS:  4.4 mCi Tc-37m MAA IV COMPARISON:  Noncontrast CT Chest, Abdomen, and Pelvis 0627 hours today. FINDINGS: In conjunction with the chest CT appearance earlier today the bilateral pulmonary perfusion radiotracer activity is fairly homogeneous  and within normal limits. No convincing perfusion defect. IMPRESSION: No pulmonary perfusion abnormality, no evidence of pulmonary embolus. Electronically Signed   By: Genevie Ann M.D.   On: 12/24/2021 11:54   DG Chest Portable 1 View  Result Date: 12/24/2021 CLINICAL DATA:  81 year old male with shortness of breath, respiratory distress.  Former smoker. EXAM: PORTABLE CHEST 1 VIEW COMPARISON:  Portable chest 09/13/2021 and earlier. FINDINGS: Portable AP semi upright view at 0432 hours. Stable lung volumes from last year. Cardiac silhouette appears mildly increased. Coronary artery stent is visible. Calcified aortic atherosclerosis. Other mediastinal contours are within normal limits. Visualized tracheal air column is within normal limits. Chronic bilateral increased interstitial markings demonstrated last year, but superimposed acute basilar predominant increased pulmonary interstitial opacity and trace new pleural fluid in the right minor fissure. No pneumothorax, layering pleural effusion, or consolidation identified. No acute osseous abnormality identified. Partially visible gastric distension in the upper abdomen. IMPRESSION: 1. Acute on chronic pulmonary interstitial opacity with trace pleural fluid in the right minor fissure most compatible with acute pulmonary edema. 2. Mildly increased cardiomegaly from last year. 3. Gas distended stomach.  NG tube decompression might be valuable. Electronically Signed   By: Genevie Ann M.D.   On: 12/24/2021 04:47   CT CHEST ABDOMEN PELVIS WO CONTRAST  Result Date: 12/24/2021 CLINICAL DATA:  81 year old male with history of respiratory distress. Abdominal pain and abdominal distension. EXAM: CT CHEST, ABDOMEN AND PELVIS WITHOUT CONTRAST TECHNIQUE: Multidetector CT imaging of the chest, abdomen and pelvis was performed following the standard protocol without IV contrast. RADIATION DOSE REDUCTION: This exam was performed according to the departmental dose-optimization program  which includes automated exposure control, adjustment of the mA and/or kV according to patient size and/or use of iterative reconstruction technique. COMPARISON:  No priors. FINDINGS: CT CHEST FINDINGS Cardiovascular: Heart size is normal. There is no significant pericardial fluid, thickening or pericardial calcification. There is aortic atherosclerosis, as well as atherosclerosis of the great vessels of the mediastinum and the coronary arteries, including calcified atherosclerotic plaque in the left main, left anterior descending, left circumflex and right coronary arteries. Mild calcifications of the aortic valve. Mediastinum/Nodes: Mediastinal or no pathologically enlarged hilar lymph nodes. Esophagus is unremarkable in appearance. No axillary lymphadenopathy. Lungs/Pleura: Small bilateral pleural effusions lying dependently. Widespread areas of ground-glass attenuation and septal thickening noted in the lungs bilaterally, with areas of peribronchovascular airspace consolidation scattered throughout the lungs, most evident in the periphery of the right lower lobe and right middle lobe. Musculoskeletal: There are no aggressive appearing lytic or blastic lesions noted in the visualized portions of the skeleton. CT ABDOMEN PELVIS FINDINGS Hepatobiliary: Diffuse low attenuation throughout the hepatic parenchyma, indicative of a background of hepatic steatosis. Several small low-attenuation lesions are scattered throughout the liver, largest of which measures up to 1.3 cm in segment 4A, incompletely characterized on today's non-contrast CT examination, but statistically likely to represent small cysts. 6 mm calcified gallstone in the fundus of the gallbladder. Gallbladder is otherwise unremarkable in appearance. Pancreas: No definite pancreatic mass or peripancreatic fluid collections or inflammatory changes are noted on today's noncontrast CT examination. Spleen: Unremarkable. Adrenals/Urinary Tract: Low-attenuation  lesions in both kidneys, incompletely characterized on today's non-contrast CT examination, but statistically likely to represent cysts, largest of which is exophytic extending off the lower pole of the right kidney measuring 7.5 cm in diameter. Mild right and moderate left renal atrophy. No hydroureteronephrosis. Urinary bladder is nearly completely decompressed with a Foley balloon catheter in place. Gas non dependently in the lumen of the urinary bladder is presumably iatrogenic. Bilateral adrenal glands are normal in appearance. Stomach/Bowel: The unenhanced appearance of the stomach is normal. There is no pathologic dilatation of small bowel or colon. Normal appendix. Vascular/Lymphatic: Aortic atherosclerosis. No lymphadenopathy noted in the abdomen or pelvis. Reproductive: Prostate gland  and seminal vesicles are unremarkable in appearance. Other: No significant volume of ascites.  No pneumoperitoneum. Musculoskeletal: There are no aggressive appearing lytic or blastic lesions noted in the visualized portions of the skeleton. IMPRESSION: 1. The appearance the chest is concerning for severe multilobar bilateral bronchopneumonia. 2. Small bilateral pleural effusions lying dependently. 3. No acute findings are noted in the abdomen or pelvis. 4. Aortic atherosclerosis, in addition to left main and three-vessel coronary artery disease. 5. Cholelithiasis without evidence of acute cholecystitis at this time. 6. Additional incidental findings, as above. Electronically Signed   By: Vinnie Langton M.D.   On: 12/24/2021 06:52    Pending Labs Unresulted Labs (From admission, onward)     Start     Ordered   12/26/21 0500  Heparin level (unfractionated)  Daily,   R      12/24/21 1625   12/25/21 1829  Basic metabolic panel  Daily,   R      12/24/21 0729   12/25/21 0500  CBC  Daily,   R      12/24/21 0729   12/25/21 0500  Protime-INR  Tomorrow morning,   R        12/24/21 0729   12/25/21 0500  Brain  natriuretic peptide  Daily,   R      12/24/21 0731   12/25/21 0130  Heparin level (unfractionated)  ONCE - STAT,   STAT        12/24/21 1625   12/24/21 0728  Expectorated Sputum Assessment w Gram Stain, Rflx to Resp Cult  Once,   R        12/24/21 0729            Vitals/Pain Today's Vitals   12/24/21 1200 12/24/21 1406 12/24/21 1645 12/24/21 1700  BP: 138/81   (!) 144/83  Pulse: 85  83 86  Resp: 17  18 20   Temp:      TempSrc:      SpO2: 93% 93% 96% 92%  Weight:      Height:      PainSc:        Isolation Precautions No active isolations  Medications Medications  nitroGLYCERIN 50 mg in dextrose 5 % 250 mL (0.2 mg/mL) infusion (5 mcg/min Intravenous Infusion Verify 12/24/21 1332)  sodium chloride flush (NS) 0.9 % injection 3 mL (3 mLs Intravenous Not Given 12/24/21 1034)  sodium chloride flush (NS) 0.9 % injection 3 mL (3 mLs Intravenous Not Given 12/24/21 1034)  acetaminophen (TYLENOL) tablet 650 mg (has no administration in time range)    Or  acetaminophen (TYLENOL) suppository 650 mg (has no administration in time range)  oxyCODONE (Oxy IR/ROXICODONE) immediate release tablet 5 mg (has no administration in time range)  HYDROmorphone (DILAUDID) injection 0.5-1 mg (has no administration in time range)  traZODone (DESYREL) tablet 25 mg (has no administration in time range)  senna-docusate (Senokot-S) tablet 1 tablet (has no administration in time range)  bisacodyl (DULCOLAX) EC tablet 5 mg (has no administration in time range)  ondansetron (ZOFRAN) tablet 4 mg (has no administration in time range)    Or  ondansetron (ZOFRAN) injection 4 mg (has no administration in time range)  ipratropium (ATROVENT) nebulizer solution 0.5 mg (has no administration in time range)  levalbuterol (XOPENEX) nebulizer solution 0.63 mg (has no administration in time range)  hydrALAZINE (APRESOLINE) injection 10 mg (has no administration in time range)  0.9 %  sodium chloride infusion (0 mLs  Intravenous Stopped 12/24/21 1700)  furosemide (LASIX)  injection 40 mg (40 mg Intravenous Given 12/24/21 0811)  levofloxacin (LEVAQUIN) IVPB 500 mg (500 mg Intravenous Not Given 12/24/21 0906)  allopurinol (ZYLOPRIM) tablet 300 mg (300 mg Oral Not Given 12/24/21 1035)  aspirin chewable tablet 81 mg (81 mg Oral Not Given 12/24/21 1034)  carvedilol (COREG) tablet 12.5 mg (12.5 mg Oral Not Given 12/24/21 0906)  rosuvastatin (CRESTOR) tablet 20 mg (has no administration in time range)  sertraline (ZOLOFT) tablet 25 mg (has no administration in time range)  insulin glargine-yfgn (SEMGLEE) injection 43 Units (43 Units Subcutaneous Given 12/24/21 1310)  pantoprazole (PROTONIX) EC tablet 40 mg (40 mg Oral Not Given 12/24/21 1035)  tamsulosin (FLOMAX) capsule 0.4 mg (has no administration in time range)  gabapentin (NEURONTIN) capsule 300 mg (300 mg Oral Not Given 12/24/21 1036)  cholecalciferol (VITAMIN D3) tablet 1,000 Units (1,000 Units Oral Not Given 12/24/21 1035)  methylPREDNISolone sodium succinate (SOLU-MEDROL) 125 mg/2 mL injection 80 mg (has no administration in time range)  insulin aspart (novoLOG) injection 0-20 Units (4 Units Subcutaneous Given 12/24/21 1313)  heparin bolus via infusion 4,000 Units (4,000 Units Intravenous Bolus from Bag 12/24/21 1657)    Followed by  heparin ADULT infusion 100 units/mL (25000 units/248mL) (1,000 Units/hr Intravenous New Bag/Given 12/24/21 1657)  furosemide (LASIX) injection 40 mg (40 mg Intravenous Given 12/24/21 0426)  levalbuterol (XOPENEX) 0.63 MG/3ML nebulizer solution (0.63 mg  Given 12/24/21 0427)  ipratropium (ATROVENT) 0.02 % nebulizer solution (0.5 mg  Given 12/24/21 0427)  ondansetron (ZOFRAN) injection 4 mg (4 mg Intravenous Given 12/24/21 0521)  ketorolac (TORADOL) 15 MG/ML injection 15 mg (15 mg Intravenous Given 12/24/21 0727)  aspirin tablet 325 mg (325 mg Oral Given 12/24/21 0811)  technetium albumin aggregated (MAA) injection solution 4.4 millicurie (4.4 millicuries  Intravenous Contrast Given 12/24/21 1120)    Mobility walks Low fall risk   Focused Assessments   R Recommendations: See Admitting Provider Note  Report given to:   Additional Notes:

## 2021-12-24 NOTE — ED Triage Notes (Signed)
Pt brought in by [facility] in resp distress. Pt arrive with NRB 15L with sats of 89%.  ?

## 2021-12-24 NOTE — Assessment & Plan Note (Signed)
-   Patient is currently chest pain-free ?-PPI:RJJOA changes on EKG, with new left bundle branch block was noted ?-Serial troponin from this morning 39, 313 .. ?>24000 now ?-Initiating as needed nitroglycerin, morphine, aspirin ?-Initiating heparin drip ?-Transferring to [facility] ?Cardiology team Dr. Sallyanne Kuster called at [facility]  ?- NPO  ?

## 2021-12-24 NOTE — Assessment & Plan Note (Signed)
-   Likely demand ischemia ?-Recycling cardiac enzymes ?-Currently not complaining of chest pain ?-As needed nitroglycerin, continue home medication of aspirin, statins, beta-blockers ?-Last echo 08/2021 reviewed ?-No significant changes on EKG, repeating ?

## 2021-12-24 NOTE — ED Notes (Addendum)
Received critical report of Troponin greater than 24,000. Patient denies chest pain at this time. Hospitalist paged, awaiting return call.  ?

## 2021-12-24 NOTE — Assessment & Plan Note (Addendum)
-   Stable currently not on any medication ?-Strict glycemic control ?

## 2021-12-24 NOTE — Assessment & Plan Note (Signed)
-   Multifactorial likely diastolic CHF exacerbation, congestion, volume overload, possible pneumonia ?-Treating underlying cause including volume overload with diuretics, antibiotics for pneumonia ?-Continue nonrebreather mask, monitoring CBG ?-DuoNeb bronchodilators ?

## 2021-12-24 NOTE — Consult Note (Signed)
Cardiology Consultation:   Patient ID: John [name] MRN: [id number]; DOB: 12-11-1940  Admit date: 12/24/2021 Date of Consult: 12/24/2021  PCP:  [name], [facility] Cardiologist:  Kirk Ruths, MD        Patient Profile:   John [name] is a 81 y.o. 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 Roger Shelter.  History of Present Illness:   John [name] reports that since his hospitalization at [facility] from 11/23 to 11/25 for acute hypoxic and hypercarbic respiratory failure in the setting of decompensated CHF he has had ongoing fatigue.  Since being discharged, he states that he has had daily fatigue, but denies ongoing SOB.  His family members were present during this evaluation however, states that he has been progressively more SOB with ambulation.  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  At approximately 3 AM this morning the patient developed sudden onset severe chest pain with associated SOB, diaphoresis, and 1 episode of emesis in the ED.  He has not had chest pain like this before.  He denies palpitations, fevers, chills, diarrhea, abdominal pain, PND, orthopnea, swelling, or focal weakness.  Given his symptoms he presented to the ED.  The patient lives with his daughter and her spouse close to [place].  He is retired.  He endorses heavy prior tobacco use but he quit at the age of 64.  He endorses drinking 4 glasses of wine daily with his last drink being yesterday.  He denies illicit drug use.  In the ED his VS were afebrile, HR 108, BP 204/110, RR 29,, satting 89% on 15 L nonrebreather.  He was subsequently transitioned to BiPAP  due to acute hypoxic and hypercarbic respiratory failure.  This was not tolerated due to emesis.  He was also noted to go in and out of atrial fibrillation while in the ED which is new for this patient.  His labs were notable for WBC 13.3, Hbg 14.9, platelets 179, BUN 38, creatinine 2.52, glucose 263, lactate 2.1 -> 1.0, ABG: 7.18/76/90 --> 7.42/42/77, and BNP 480.  Troponins were obtained which were markedly elevated 39 -> 313 -> >24,000.  A CT c/a/p was performed which showed bilateral pulmonary infiltrates concerning for bronchopneumonia, small bilateral pleural effusions, and multivessel CAD including left main disease.  He was given heparin, Lasix 40 mg x2, Solu-Medrol and started on a nitro drip.  He was transferred to [facility] from [facility] for consideration of LHC.  On my assessment at the bedside the patient was chest pain-free, conversant, and had no complaints.   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)    BPH (benign prostatic hyperplasia)    CAD (coronary artery disease)    Cholesteatoma of attic    Depression    Diabetes mellitus ([place])    Ear disease    GERD (gastroesophageal reflux disease)    Hyperlipidemia    Hypertension    PVD (peripheral vascular disease) ([employer])    Stroke [facility])     Past Surgical History:  Procedure Laterality Date   BONE ANCHORED HEARING AID IMPLANT Left 06/25/2013  Procedure: BONE ANCHORED HEARING AID (BAHA) IMPLANT;  Surgeon: Fannie Knee, MD;  Location: [facility];  Service: ENT;  Laterality: Left;   CAROTID STENT INSERTION Left    COCHLEAR IMPLANT     CORONARY STENT PLACEMENT     MASS EXCISION Left 06/25/2013   Procedure: EXCISION LEFT TEMPORAL MASS;  Surgeon: Fannie Knee, MD;  Location: [facility];  Service: ENT;  Laterality: Left;   TONSILLECTOMY       Home Medications:  Prior to Admission medications   Medication Sig Start Date End Date Taking? Authorizing Provider  albuterol (VENTOLIN  HFA) 108 (90 Base) MCG/ACT inhaler Inhale 2 puffs into the lungs every 6 (six) hours as needed for wheezing or shortness of breath. 12/20/20   Pokhrel, Corrie Mckusick, MD  allopurinol (ZYLOPRIM) 300 MG tablet Take 300 mg by mouth daily. 06/21/12   [provider]  aspirin 81 MG tablet Take 81 mg by mouth daily. Take 1 tab daily    [provider]  canagliflozin (INVOKANA) 100 MG TABS tablet Take 100 mg by mouth daily. 04/28/20   [provider]  carvedilol (COREG) 12.5 MG tablet Take 1 tablet (12.5 mg total) by mouth 2 (two) times daily with a meal. 09/18/21   Crenshaw, Denice Bors, MD  cholecalciferol (VITAMIN D3) 25 MCG (1000 UNIT) tablet Take 1 tablet by mouth daily.    [provider]  Cyanocobalamin (VITAMIN B 12 PO) Take 1 tablet by mouth daily.    [provider]  furosemide (LASIX) 20 MG tablet Take 1 tablet (20 mg total) by mouth every other day. May take extra tablet as needed for 2-3 lb weight gain Patient taking differently: Take 20 mg by mouth daily. May take extra tablet as needed for 2-3 lb weight gain 05/08/21 05/08/22  Lelon Perla, MD  gabapentin (NEURONTIN) 300 MG capsule Take 300 mg by mouth 2 (two) times daily. 08/04/19   [provider]  glimepiride (AMARYL) 4 MG tablet Take 4 mg by mouth daily. 05/02/15   [provider]  insulin glargine (LANTUS SOLOSTAR) 100 UNIT/ML Solostar Pen Inject 43 Units into the skin daily. 10/01/19   [provider]  pantoprazole (PROTONIX) 40 MG tablet Take 40 mg by mouth daily. 08/19/14   [provider]  rosuvastatin (CRESTOR) 20 MG tablet Take 1 tablet (20 mg total) by mouth every evening. 12/14/21   Lelon Perla, MD  senna (SENOKOT) 8.6 MG tablet Take 1 tablet by mouth as needed for constipation.    [provider]  sertraline (ZOLOFT) 25 MG tablet Take 1 tablet (25 mg total) by mouth every evening. 01/09/21   Thayer Headings, PMHNP  tamsulosin (FLOMAX) 0.4 MG CAPS  capsule Take 0.4 mg by mouth every evening. 09/22/14   [provider]    Inpatient Medications: Scheduled Meds:  allopurinol  300 mg Oral Daily   aspirin  81 mg Oral Daily   carvedilol  12.5 mg Oral BID WC   cholecalciferol  1,000 Units Oral Daily   furosemide  40 mg Intravenous Q12H   gabapentin  300 mg Oral BID   insulin aspart  0-20 Units Subcutaneous TID WC   insulin glargine-yfgn  43 Units Subcutaneous Daily   methylPREDNISolone (SOLU-MEDROL) injection  80 mg Intravenous Q12H   pantoprazole  40 mg Oral Daily   rosuvastatin  20 mg Oral QPM   sertraline  25 mg Oral QPM   sodium chloride flush  3 mL Intravenous  Q12H   sodium chloride flush  3 mL Intravenous Q12H   tamsulosin  0.4 mg Oral QPM   Continuous Infusions:  sodium chloride Stopped (12/24/21 1700)   heparin 1,000 Units/hr (12/24/21 1657)   levofloxacin (LEVAQUIN) IV     nitroGLYCERIN 5 mcg/min (12/24/21 1332)   PRN Meds: sodium chloride, acetaminophen **OR** acetaminophen, bisacodyl, hydrALAZINE, HYDROmorphone (DILAUDID) injection, ipratropium, levalbuterol, ondansetron **OR** ondansetron (ZOFRAN) IV, oxyCODONE, senna-docusate, traZODone  Allergies:    Allergies  Allergen Reactions   Penicillins Hives   Ticagrelor Shortness Of Breath   Ezetimibe-Simvastatin Other (See Comments)    Myalgia     Social History:   Social History   Socioeconomic History   Marital status: Widowed    Spouse name: Not on file   Number of children: 3   Years of education: Not on file   Highest education level: Not on file  Occupational History   Not on file  Tobacco Use   Smoking status: Former    Types: Cigarettes    Quit date: 05/19/1989    Years since quitting: 32.6   Smokeless tobacco: Never  Substance and Sexual Activity   Alcohol use: Not Currently   Drug use: No   Sexual activity: Not on file  Other Topics Concern   Not on file  Social History Narrative   Not on file   Social Determinants of Health    Financial Resource Strain: Not on file  Food Insecurity: Not on file  Transportation Needs: Not on file  Physical Activity: Not on file  Stress: Not on file  Social Connections: Not on file  Intimate Partner Violence: Not on file    Family History:    Family History  Problem Relation Age of Onset   Diabetes Other    Diabetes Mother    Diabetes Maternal Aunt    Depression Other    Depression Other    Drug abuse Daughter    Diabetes Daughter      ROS:  Please see the history of present illness.  All other ROS reviewed and negative.     Physical Exam/Data:   Vitals:   12/24/21 1645 12/24/21 1651 12/24/21 1700 12/24/21 1730  BP:  (!) 151/85 (!) 144/83 (!) 154/81  Pulse: 83 84 86 84  Resp: 18 19 20 19   Temp:      TempSrc:      SpO2: 96% 97% 92% 95%  Weight:      Height:        Intake/Output Summary (Last 24 hours) at 12/24/2021 1840 Last data filed at 12/24/2021 1700 Gross per 24 hour  Intake 88.5 ml  Output --  Net 88.5 ml   Last 3 Weights 12/24/2021 09/22/2021 09/15/2021  Weight (lbs) 196 lb 10.4 oz 196 lb 9.6 oz 195 lb 15.8 oz  Weight (kg) 89.2 kg 89.177 kg 88.9 kg     Body mass index is 31.74 kg/m.  General: Elderly, chronically ill-appearing gentleman in NAD, very pleasant HEENT: Atraumatic, normocephalic, hearing aids in place, + Frank's sign Neck: no JVD but challenging to appreciate given body habitus Vascular: No carotid bruits; Distal pulses 2+ bilaterally Cardiac:  normal S1, S2; RRR; no murmur  Lungs: Bibasilar Rales, no wheezes or rhonchi Abd: soft, nontender, no hepatomegaly, obese Ext: Trace bilateral edema, WWP Musculoskeletal:  No deformities, BUE and BLE strength normal and equal Skin: warm and dry  Neuro: Grossly normal Psych:  Normal affect   EKG:  The EKG was personally reviewed and demonstrates: Atrial  fibrillation with frequent PVCs, ST segment depression in V2-V3    Telemetry:  Telemetry was personally reviewed and demonstrates:  NSR  Relevant CV Studies:  TTE 09/14/21:  IMPRESSIONS     1. Left ventricular ejection fraction, by estimation, is 40 to 45%. The  left ventricle has mildly decreased function. The left ventricle  demonstrates regional wall motion abnormalities (see scoring  diagram/findings for description). There is moderate  left ventricular hypertrophy. Left ventricular diastolic parameters are  consistent with Grade II diastolic dysfunction (pseudonormalization).   2. Right ventricular systolic function is normal. The right ventricular  size is normal. Tricuspid regurgitation signal is inadequate for assessing  PA pressure.   3. Left atrial size was mildly dilated.   4. The mitral valve is grossly normal. Trivial mitral valve  regurgitation. No evidence of mitral stenosis.   5. The aortic valve is grossly normal. Aortic valve regurgitation is not  visualized. No aortic stenosis is present.   6. The inferior vena cava is normal in size with greater than 50%  respiratory variability, suggesting right atrial pressure of 3 mmHg.   Laboratory Data:  High Sensitivity Troponin:   Recent Labs  Lab 12/24/21 0419 12/24/21 0635 12/24/21 1433  TROPONINIHS 39* 313* >24,000*     Chemistry Recent Labs  Lab 12/24/21 0419  NA 137  K 4.2  CL 101  CO2 26  GLUCOSE 263*  BUN 38*  CREATININE 2.52*  CALCIUM 9.0  GFRNONAA 25*  ANIONGAP 10    Recent Labs  Lab 12/24/21 0419  PROT 8.4*  ALBUMIN 4.2  AST 32  ALT 25  ALKPHOS 74  BILITOT 0.9   Lipids No results for input(s): CHOL, TRIG, HDL, LABVLDL, LDLCALC, CHOLHDL in the last 168 hours.  Hematology Recent Labs  Lab 12/24/21 0419  WBC 13.3*  RBC 5.08  HGB 14.9  HCT 48.2  MCV 94.9  MCH 29.3  MCHC 30.9  RDW 16.0*  PLT 179   Thyroid No results for input(s): TSH, FREET4 in the last 168 hours.  BNP Recent Labs  Lab 12/24/21 0419  BNP 480.0*    DDimer  Recent Labs  Lab 12/24/21 0419  DDIMER 2.19*     Radiology/Studies:   NM Pulmonary Perfusion  Result Date: 12/24/2021 CLINICAL DATA:  81 year old male with respiratory distress, shortness of breath. EXAM: NUCLEAR MEDICINE PERFUSION LUNG SCAN TECHNIQUE: Perfusion images were obtained in multiple projections after intravenous injection of radiopharmaceutical. Ventilation scans intentionally deferred if perfusion scan and chest x-ray adequate for interpretation during COVID 19 epidemic. RADIOPHARMACEUTICALS:  4.4 mCi Tc-53m MAA IV COMPARISON:  Noncontrast CT Chest, Abdomen, and Pelvis 0627 hours today. FINDINGS: In conjunction with the chest CT appearance earlier today the bilateral pulmonary perfusion radiotracer activity is fairly homogeneous and within normal limits. No convincing perfusion defect. IMPRESSION: No pulmonary perfusion abnormality, no evidence of pulmonary embolus. Electronically Signed   By: Genevie Ann M.D.   On: 12/24/2021 11:54   DG Chest Portable 1 View  Result Date: 12/24/2021 CLINICAL DATA:  81 year old male with shortness of breath, respiratory distress. Former smoker. EXAM: PORTABLE CHEST 1 VIEW COMPARISON:  Portable chest 09/13/2021 and earlier. FINDINGS: Portable AP semi upright view at 0432 hours. Stable lung volumes from last year. Cardiac silhouette appears mildly increased. Coronary artery stent is visible. Calcified aortic atherosclerosis. Other mediastinal contours are within normal limits. Visualized tracheal air column is within normal limits. Chronic bilateral increased interstitial markings demonstrated last year, but superimposed acute basilar predominant increased pulmonary interstitial opacity  and trace new pleural fluid in the right minor fissure. No pneumothorax, layering pleural effusion, or consolidation identified. No acute osseous abnormality identified. Partially visible gastric distension in the upper abdomen. IMPRESSION: 1. Acute on chronic pulmonary interstitial opacity with trace pleural fluid in the right minor fissure most  compatible with acute pulmonary edema. 2. Mildly increased cardiomegaly from last year. 3. Gas distended stomach.  NG tube decompression might be valuable. Electronically Signed   By: Genevie Ann M.D.   On: 12/24/2021 04:47   CT CHEST ABDOMEN PELVIS WO CONTRAST  Result Date: 12/24/2021 CLINICAL DATA:  81 year old male with history of respiratory distress. Abdominal pain and abdominal distension. EXAM: CT CHEST, ABDOMEN AND PELVIS WITHOUT CONTRAST TECHNIQUE: Multidetector CT imaging of the chest, abdomen and pelvis was performed following the standard protocol without IV contrast. RADIATION DOSE REDUCTION: This exam was performed according to the departmental dose-optimization program which includes automated exposure control, adjustment of the mA and/or kV according to patient size and/or use of iterative reconstruction technique. COMPARISON:  No priors. FINDINGS: CT CHEST FINDINGS Cardiovascular: Heart size is normal. There is no significant pericardial fluid, thickening or pericardial calcification. There is aortic atherosclerosis, as well as atherosclerosis of the great vessels of the mediastinum and the coronary arteries, including calcified atherosclerotic plaque in the left main, left anterior descending, left circumflex and right coronary arteries. Mild calcifications of the aortic valve. Mediastinum/Nodes: Mediastinal or no pathologically enlarged hilar lymph nodes. Esophagus is unremarkable in appearance. No axillary lymphadenopathy. Lungs/Pleura: Small bilateral pleural effusions lying dependently. Widespread areas of ground-glass attenuation and septal thickening noted in the lungs bilaterally, with areas of peribronchovascular airspace consolidation scattered throughout the lungs, most evident in the periphery of the right lower lobe and right middle lobe. Musculoskeletal: There are no aggressive appearing lytic or blastic lesions noted in the visualized portions of the skeleton. CT ABDOMEN PELVIS  FINDINGS Hepatobiliary: Diffuse low attenuation throughout the hepatic parenchyma, indicative of a background of hepatic steatosis. Several small low-attenuation lesions are scattered throughout the liver, largest of which measures up to 1.3 cm in segment 4A, incompletely characterized on today's non-contrast CT examination, but statistically likely to represent small cysts. 6 mm calcified gallstone in the fundus of the gallbladder. Gallbladder is otherwise unremarkable in appearance. Pancreas: No definite pancreatic mass or peripancreatic fluid collections or inflammatory changes are noted on today's noncontrast CT examination. Spleen: Unremarkable. Adrenals/Urinary Tract: Low-attenuation lesions in both kidneys, incompletely characterized on today's non-contrast CT examination, but statistically likely to represent cysts, largest of which is exophytic extending off the lower pole of the right kidney measuring 7.5 cm in diameter. Mild right and moderate left renal atrophy. No hydroureteronephrosis. Urinary bladder is nearly completely decompressed with a Foley balloon catheter in place. Gas non dependently in the lumen of the urinary bladder is presumably iatrogenic. Bilateral adrenal glands are normal in appearance. Stomach/Bowel: The unenhanced appearance of the stomach is normal. There is no pathologic dilatation of small bowel or colon. Normal appendix. Vascular/Lymphatic: Aortic atherosclerosis. No lymphadenopathy noted in the abdomen or pelvis. Reproductive: Prostate gland and seminal vesicles are unremarkable in appearance. Other: No significant volume of ascites.  No pneumoperitoneum. Musculoskeletal: There are no aggressive appearing lytic or blastic lesions noted in the visualized portions of the skeleton. IMPRESSION: 1. The appearance the chest is concerning for severe multilobar bilateral bronchopneumonia. 2. Small bilateral pleural effusions lying dependently. 3. No acute findings are noted in the  abdomen or pelvis. 4. Aortic atherosclerosis, in addition to left main  and three-vessel coronary artery disease. 5. Cholelithiasis without evidence of acute cholecystitis at this time. 6. Additional incidental findings, as above. Electronically Signed   By: Vinnie Langton M.D.   On: 12/24/2021 06:52     Assessment and Plan:   John [name] is a 81 y.o. 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 Roger Shelter.  #Type I NSTEMI :: Patient presented with acute onset chest pain, SOB, and diaphoresis and found to have markedly elevated troponins >24,000.  CT chest performed in the ED also revealed significant multivessel CAD involving the left main coronary as well.  All of these findings confirm the diagnosis of NSTEMI which is likely a type I NSTEMI.  The patient will ultimately need to undergo an LHC and perhaps would need CABG depending on the severity of his multivessel CAD.  Currently his serum creatinine is elevated above his baseline likely from cardiorenal syndrome.  We will have to be cautious of giving him contrast in the setting of his AKI and may need to first optimize his creatinine as best possible.  For now we will initiate medical management of NSTEMI and reassess his creatinine in the morning. -s/p ASA load, continue ASA 81 mg daily -Continue heparin gtt -Continue home rosuvastatin 20 mg daily -Continue Coreg 12.5 mg twice daily -Do not initiate ACE I or ARB given AKI -Continue nitro gtt titrated to chest pain-free -Tentatively plan for [facility] tomorrow pending reevaluation of serum creatinine -NPO at [place] -TTE -Check lipid panel, TSH -Maintain telemetry  #Acute on Chronic HFmrEF :: EF most recently 40-45%.  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  Recommend ongoing diuresis to achieve euvolemia.  We will also get a repeat echo given that he suffered an MI to reevaluate his EF.  -s/p lasiv 40 mg x2 -Spot dose Lasix to achieve net -1-2L -TTE -Daily weights -Strict I's and O's  #New Onset Atrial Fibrillation :: Noted to go in and out of atrial fibrillation while in the ED with his atrial fibrillation captured on EKG.  This is likely secondary to his acute MI.  Regardless, his CHA2DS2-VASc score is markedly elevated at 6 necessitating long-term anticoagulation.  For now we will heparinize and determine anticoagulation strategy pending LHC results. -Maintain telemetry -Continue heparin -Rate control with Coreg -TTE as above  #HTN #HLD -continue regimens as detailed above  #PVD -continue ASA, if started on a P2Y12 and OAC then would favor stopping ASA at some point in the future.  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 Will continue diuresis with the goal of getting him net neg 1-2 L. He may need escalating doses of diuretics given his AKI. -Daily RFP -diuresis as above -Strict I/O's  #Acute Hypoxic and Hypercapneic Respiratory Failure #Acute Pulmonary Edema -continue to wean O2 as tolerated -diuresis as above  #BPH -continue home flomax     Risk Assessment/Risk Scores:     TIMI Risk Score for Unstable Angina or Non-ST Elevation MI:   The patient's TIMI risk score is 7, which indicates a 41% risk of all cause mortality, new or recurrent  myocardial infarction or need for urgent revascularization in the next 14 days.  New York Heart Association (NYHA) Functional Class NYHA Class III  CHA2DS2-VASc Score = 6  This indicates a 9.7% annual risk of stroke. The patient's score is based upon: Age >75, diabetes, HTN, vascular disease, CHF       For questions or  updates, please contact [facility] Please consult [email or web address] for contact info under    Signed, Hershal Coria, MD  12/24/2021 6:40 PM

## 2021-12-24 NOTE — Assessment & Plan Note (Signed)
-   Elevated BNP, chest x-ray consistent with congestive ?Lower extremity edema --finding consistent with CHF exacerbation ?-Last echocardiogram from 08/2021 was reviewed, ejection fraction around 90%, with 2 diastolic dysfunction ?-We will not repeat echocardiogram unless patient progressed to worsening ?-Continue IV Lasix 40 twice daily, ?-We will monitor I's and O's, daily weight, creatinine function ?-We will continue home medication including beta-blockers, not on ACE inhibitors likely due to CKD ?

## 2021-12-24 NOTE — Assessment & Plan Note (Signed)
-   History of CABG, continue aspirin, continue beta-blockers continue statins ?

## 2021-12-24 NOTE — Assessment & Plan Note (Addendum)
-   Noncompliant with CPAP  ?-Not on supplemental O2 at home ?-Courage patient to be compliant with his home CPAP ?-Could not tolerate BiPAP currently on high flow O2 tolerating ?

## 2021-12-24 NOTE — Progress Notes (Signed)
Pt admitted to [facility] from [facility] ED.  Pt is A&O X4 and neuro intact.  Pt placed on telemetry and CCMD notified.  Vitals taken and within normal range with exception of BP: 148/83 (102).  Pt states he has no SOB at the moment on 6L O2.  Pt is oriented to room with call light in reach.  Pt is currently comfortable and not in pain.   ? 12/24/21 1836  ?Vitals  ?Temp 99.1 ?F (37.3 ?C)  ?Temp Source Oral  ?BP (!) 148/83  ?MAP (mmHg) 102  ?BP Location Left Arm  ?BP Method Automatic  ?Patient Position (if appropriate) Lying  ?Pulse Rate 84  ?Pulse Rate Source Monitor  ?ECG Heart Rate 85  ?Resp 19  ?Level of Consciousness  ?Level of Consciousness Alert  ?Oxygen Therapy  ?SpO2 95 %  ?O2 Device Nasal Cannula  ?O2 Flow Rate (L/min) 6 L/min  ?Pain Assessment  ?Pain Scale 0-10  ?Pain Score 0  ?POSS Scale ([place] Opioid Sedation Scale)  ?POSS *See Group Information* 1-Acceptable,Awake and alert  ?PCA/Epidural/Spinal Assessment  ?Respiratory Pattern Regular;Unlabored  ?Glasgow Coma Scale  ?Eye Opening 4  ?Best Verbal Response (NON-intubated) 5  ?Best Motor Response 6  ?Glasgow Coma Scale Score 15  ?MEWS Score  ?MEWS Temp 0  ?MEWS Systolic 0  ?MEWS Pulse 0  ?MEWS RR 0  ?MEWS LOC 0  ?MEWS Score 0  ?MEWS Score Color Green  ? ? ?

## 2021-12-24 NOTE — Assessment & Plan Note (Signed)
-   Multifactorial likely diastolic CHF exacerbation, volume overload, possible pneumonia ?-Treating aggressively with diuretics Lasix ?-Patient was initially placed on BiPAP, could not tolerate, due to vomiting now on nonrebreather Xarelto mask, satting 100% ?-Continue high flow oxygen via nonrebreather mask, will wean down slowly ?-Initiating IV steroids ?-Continue IV Lasix ?-DuoNeb bronchodilators ?-IV antibiotics ?-Repeating ABG, imaging including chest x-ray or CT scan if patient condition does not improve ?

## 2021-12-24 NOTE — Assessment & Plan Note (Signed)
-   Continue home medication ?-Last echo reviewed as above ?

## 2021-12-24 NOTE — Assessment & Plan Note (Signed)
-   Monitoring, PSA ?-Continue Flomax ?

## 2021-12-24 NOTE — Assessment & Plan Note (Signed)
Continue Crestor 

## 2021-12-24 NOTE — Progress Notes (Signed)
PROGRESS NOTE    Patient: John [name]                            PCP: [name], DO                    DOB: 11-27-1940            DOA: 12/24/2021 SEG:[id number]             DOS: 12/24/2021, 4:22 PM   LOS: 0 days   Date of Service: The patient was seen and examined on 12/24/2021  Subjective:   The patient was seen and examined this morning. Currently stable on BiPAP Not complaining of chest pain Noted for elevated BNP, changes on EKG  Agreed to transfer to [facility] for further evaluation by cardiology  Brief Narrative:   John [name] is a 81 yo M with PMH/o DMII, OSA, dCHF, BPH, CAD, HTN, HLD CVA, PVD presented with shortness of breath.    acute onset difficulty breathing for the past 2 to 3 hours.  He took his home albuterol without relief.  No home oxygen use.  EMS arrival patient was hypoxic, placed on 15 L nonrebreather with improvement to pulse ox into the low 90s.  90-92.  Patient reports cough, unclear what color the sputum is but does feel it has been productive.  No longer smoking.  Patient denies chest pain, fevers or chills, no abdominal pain, no rashes.  No recent change in medications or diet.    ED:  On arrival SBP> 200 Blood pressure (!) 156/85, pulse 87, temperature 97.6 F (36.4 C), temperature source Axillary, resp. rate (!) 23, height 5\' 6"  (1.676 m), weight 89.2 kg, SpO2 97 %.  ABG : PH 7.16, PCO2 76, bicarb 28.4 BNP 480, BUN 38, creatinine 2.52, WBC 13.3, lactic acid 2.1, 1.0, D-dimer 2.19, troponin 39 - 313  EKG: A-fib with a rate of 97 PVCs Chest x-ray: Pleural fluid, pulmonary edema, cardiomegaly,      Assessment & Plan:   Principal Problem:   NSTEMI (non-ST elevated myocardial infarction) (HCC) Active Problems:   Acute exacerbation of CHF (congestive heart failure) (HCC)   Acute respiratory failure with hypoxia and hypercapnia (HCC)   Hypoxia   Essential hypertension, malignant   D-dimer, elevated   CAD (coronary artery disease) of  artery bypass graft   Cardiomyopathy, ischemic   Mixed hyperlipidemia   Peripheral vascular disease (HCC)   Coronary atherosclerosis of native coronary artery   Elevated troponin   Obstructive sleep apnea   BPH (benign prostatic hyperplasia)   CKD (chronic kidney disease), stage III (HCC)   Diabetic neuropathy (HCC)     Assessment and Plan: * NSTEMI (non-ST elevated myocardial infarction) ([place]) - Patient is currently chest pain-free -VPX:TGGYI changes on EKG, with new left bundle branch block was noted -Serial troponin from this morning 39, 313 .. >24000 now -Initiating as needed nitroglycerin, morphine, aspirin -Initiating heparin drip -Transferring to [facility] Cardiology team Dr. Sallyanne Kuster called at [facility]  - NPO   Hypoxia - Multifactorial likely diastolic CHF exacerbation, congestion, volume overload, possible pneumonia -Treating underlying cause including volume overload with diuretics, antibiotics for pneumonia -Continue nonrebreather mask, monitoring CBG -DuoNeb bronchodilators  Acute respiratory failure with hypoxia and hypercapnia (HCC) - Multifactorial likely diastolic CHF exacerbation, volume overload, possible pneumonia -Treating aggressively with diuretics Lasix -Patient was initially placed on BiPAP, could not tolerate, due to vomiting now on nonrebreather  Xarelto mask, satting 100% -Continue [name] flow oxygen via nonrebreather mask, will wean down slowly -Initiating IV steroids -Continue IV Lasix -DuoNeb bronchodilators -IV antibiotics -Repeating ABG, imaging including chest x-ray or CT scan if patient condition does not improve  Acute exacerbation of CHF (congestive heart failure) (HCC) - Elevated BNP, chest x-ray consistent with congestive Lower extremity edema --finding consistent with CHF exacerbation -Last echocardiogram from 08/2021 was reviewed, ejection fraction around 94%, with 2 diastolic dysfunction -We will not repeat echocardiogram unless patient  progressed to worsening -Continue IV Lasix 40 twice daily, -We will monitor I's and O's, daily weight, creatinine function -We will continue home medication including beta-blockers, not on ACE inhibitors likely due to CKD  Essential hypertension, malignant - History of underlying hypertension currently accelerated hypertension  -On arrival SBP > 200 -Has been started on nitroglycerin drip, BP continue to improve... Planning to wean off nitroglycerin drip -Restarting home medication Coreg -Continue as needed hydralazine  D-dimer, elevated - Likely acute reactant -Due to CKD, obtaining V/Q nuclear scan -Shortness with hypoxia most likely due to CHF exacerbation, congestion, pneumonia, -We will monitor closely  Diabetic neuropathy (HCC) - Stable currently not on any medication -Strict glycemic control  CKD (chronic kidney disease), stage III (HCC) - Baseline creatinine around 2 -Mildly elevated now -Kidney function closely -Fortunately expecting creatinine function to worsen due to aggressive diuretics Lasix -Creatinine 2.35 >>>   BPH (benign prostatic hyperplasia) - Monitoring, PSA -Continue Flomax  Obstructive sleep apnea - Noncompliant with CPAP  -Not on supplemental O2 at home -Courage patient to be compliant with his home CPAP -Could not tolerate BiPAP currently on [name] flow O2 tolerating  Elevated troponin - Likely demand ischemia -Recycling cardiac enzymes -Currently not complaining of chest pain -As needed nitroglycerin, continue home medication of aspirin, statins, beta-blockers -Last echo 08/2021 reviewed -No significant changes on EKG, repeating  Coronary atherosclerosis of native coronary artery - Continue home medication -Last echo reviewed as above  Peripheral vascular disease ([place]) - Continue aspirin, continue statins  Mixed hyperlipidemia - Continue Crestor  Cardiomyopathy, ischemic - Last echo reviewed 08/2021 ejection fraction around 40%, grade  2 diastolic dysfunction -Elevated troponin due to accelerated hypertension, CHF exacerbation, no overt changes in EKG -Denies of any chest pain at this time, will continue to monitor -As needed nitroglycerin, continuing aspirin and statins,  CAD (coronary artery disease) of artery bypass graft - History of CABG, continue aspirin, continue beta-blockers continue statins     DVT prophylaxis:  heparin injection 5,000 Units Start: 12/24/21 1400 TED hose Start: 12/24/21 0727 SCDs Start: 12/24/21 0727   Code Status:   Code Status: Full Code  Family Communication: No family member present at bedside- attempt will be made to update daily The above findings and plan of care has been discussed with patient (and family)  in detail,  they expressed understanding and agreement of above. -Advance care planning has been discussed.   Admission status:   Status is: Inpatient Remains inpatient appropriate because: Needing aggressive intervention for respiratory failure, non-STEMI traumatic    Procedures:   No admission procedures for hospital encounter.   Antimicrobials:  Anti-infectives (From admission, onward)    Start     Dose/Rate Route Frequency Ordered Stop   12/24/21 0815  levofloxacin (LEVAQUIN) IVPB 500 mg        500 mg 100 mL/hr over 60 Minutes Intravenous Every 48 hours 12/24/21 0736 12/30/21 0759   12/24/21 0700  levofloxacin (LEVAQUIN) IVPB 750 mg  Status:  Discontinued  750 mg 100 mL/hr over 90 Minutes Intravenous  Once 12/24/21 0658 12/24/21 0800        Medication:   allopurinol  300 mg Oral Daily   aspirin  81 mg Oral Daily   carvedilol  12.5 mg Oral BID WC   cholecalciferol  1,000 Units Oral Daily   furosemide  40 mg Intravenous Q12H   gabapentin  300 mg Oral BID   heparin  5,000 Units Subcutaneous Q8H   insulin aspart  0-20 Units Subcutaneous TID WC   insulin glargine-yfgn  43 Units Subcutaneous Daily   methylPREDNISolone (SOLU-MEDROL) injection  80  mg Intravenous Q12H   pantoprazole  40 mg Oral Daily   rosuvastatin  20 mg Oral QPM   sertraline  25 mg Oral QPM   sodium chloride flush  3 mL Intravenous Q12H   sodium chloride flush  3 mL Intravenous Q12H   tamsulosin  0.4 mg Oral QPM    sodium chloride, acetaminophen **OR** acetaminophen, bisacodyl, hydrALAZINE, HYDROmorphone (DILAUDID) injection, ipratropium, levalbuterol, ondansetron **OR** ondansetron (ZOFRAN) IV, oxyCODONE, senna-docusate, traZODone   Objective:   Vitals:   12/24/21 1100 12/24/21 1130 12/24/21 1200 12/24/21 1406  BP: 137/79 124/70 138/81   Pulse: 82  85   Resp: 16 16 17    Temp:      TempSrc:      SpO2: 97%  93% 93%  Weight:      Height:       No intake or output data in the 24 hours ending 12/24/21 1622 Filed Weights   12/24/21 0429  Weight: 89.2 kg     Examination:   Constitution: In moderate distress, remains on BiPAP, Psychiatric:   Normal and stable mood and affect, cognition intact,   HEENT:        Normocephalic, PERRL, otherwise with in Normal limits  Chest:         Chest symmetric Cardio vascular:  S1/S2, mildly tachycardic, no murmure, No Rubs or Gallops  pulmonary: Mild-moderate labored breathing, on BiPAP, diffuse wheezing and rhonchi, negative crackles Abdomen: Soft, non-tender, non-distended, bowel sounds,no masses, no organomegaly Muscular skeletal: Limited exam - in bed, able to move all 4 extremities,   Neuro: CNII-XII intact. , normal motor and sensation, reflexes intact  Extremities: No pitting edema lower extremities, +2 pulses  Skin: Dry, warm to touch, negative for any Rashes, No open wounds Wounds: per nursing documentation   ------------------------------------------------------------------------------------------------------------------------------------------    LABs:  CBC Latest Ref Rng & Units 12/24/2021 09/15/2021 09/14/2021  WBC 4.0 - 10.5 K/uL 13.3(H) 8.2 12.0(H)  Hemoglobin 13.0 - 17.0 g/dL 14.9 12.2(L) 12.3(L)   Hematocrit 39.0 - 52.0 % 48.2 37.3(L) 38.8(L)  Platelets 150 - 400 K/uL 179 150 160   CMP Latest Ref Rng & Units 12/24/2021 09/15/2021 09/14/2021  Glucose 70 - 99 mg/dL 263(H) 130(H) 190(H)  BUN 8 - 23 mg/dL 38(H) 57(H) 56(H)  Creatinine 0.61 - 1.24 mg/dL 2.52(H) 2.15(H) 2.35(H)  Sodium 135 - 145 mmol/L 137 136 134(L)  Potassium 3.5 - 5.1 mmol/L 4.2 4.3 4.5  Chloride 98 - 111 mmol/L 101 102 98  CO2 22 - 32 mmol/L 26 25 26   Calcium 8.9 - 10.3 mg/dL 9.0 8.8(L) 8.7(L)  Total Protein 6.5 - 8.1 g/dL 8.4(H) - 7.0  Total Bilirubin 0.3 - 1.2 mg/dL 0.9 - 1.0  Alkaline Phos 38 - 126 U/L 74 - 51  AST 15 - 41 U/L 32 - 22  ALT 0 - 44 U/L 25 - 14  Micro Results Recent Results (from the past 240 hour(s))  Resp Panel by RT-PCR (Flu A&B, Covid) Nasopharyngeal Swab     Status: None   Collection Time: 12/24/21  4:27 AM   Specimen: Nasopharyngeal Swab; Nasopharyngeal(NP) swabs in vial transport medium  Result Value Ref Range Status   SARS Coronavirus 2 by RT PCR NEGATIVE NEGATIVE Final    Comment: (NOTE) SARS-CoV-2 target nucleic acids are NOT DETECTED.  The SARS-CoV-2 RNA is generally detectable in upper respiratory specimens during the acute phase of infection. The lowest concentration of SARS-CoV-2 viral copies this assay can detect is 138 copies/mL. A negative result does not preclude SARS-Cov-2 infection and should not be used as the sole basis for treatment or other patient management decisions. A negative result may occur with  improper specimen collection/handling, submission of specimen other than nasopharyngeal swab, presence of viral mutation(s) within the areas targeted by this assay, and inadequate number of viral copies(<138 copies/mL). A negative result must be combined with clinical observations, patient history, and epidemiological information. The expected result is Negative.  Fact Sheet for Patients:  EntrepreneurPulse.com.au  Fact Sheet for  Healthcare Providers:  IncredibleEmployment.be  This test is no t yet approved or cleared by the [place] FDA and  has been authorized for detection and/or diagnosis of SARS-CoV-2 by FDA under an Emergency Use Authorization (EUA). This EUA will remain  in effect (meaning this test can be used) for the duration of the COVID-19 declaration under Section 564(b)(1) of the Act, 21 U.S.C.section 360bbb-3(b)(1), unless the authorization is terminated  or revoked sooner.       Influenza A by PCR NEGATIVE NEGATIVE Final   Influenza B by PCR NEGATIVE NEGATIVE Final    Comment: (NOTE) The Xpert Xpress SARS-CoV-2/FLU/RSV plus assay is intended as an aid in the diagnosis of influenza from Nasopharyngeal swab specimens and should not be used as a sole basis for treatment. Nasal washings and aspirates are unacceptable for Xpert Xpress SARS-CoV-2/FLU/RSV testing.  Fact Sheet for Patients: EntrepreneurPulse.com.au  Fact Sheet for Healthcare Providers: IncredibleEmployment.be  This test is not yet approved or cleared by the [place] FDA and has been authorized for detection and/or diagnosis of SARS-CoV-2 by FDA under an Emergency Use Authorization (EUA). This EUA will remain in effect (meaning this test can be used) for the duration of the COVID-19 declaration under Section 564(b)(1) of the Act, 21 U.S.C. section 360bbb-3(b)(1), unless the authorization is terminated or revoked.  Performed at [facility], [address] [place], [place] [address]   Culture, blood (routine x 2)     Status: None (Preliminary result)   Collection Time: 12/24/21  8:05 AM   Specimen: Left Antecubital; Blood  Result Value Ref Range Status   Specimen Description   Final    LEFT ANTECUBITAL BOTTLES DRAWN AEROBIC AND ANAEROBIC   Special Requests   Final    Blood Culture adequate volume Performed at [facility], [address], [place], [place]  [address]    Culture PENDING  Incomplete   Report Status PENDING  Incomplete  Culture, blood (routine x 2)     Status: None (Preliminary result)   Collection Time: 12/24/21  8:07 AM   Specimen: Left Antecubital; Blood  Result Value Ref Range Status   Specimen Description   Final    BLOOD RIGHT HAND BOTTLES DRAWN AEROBIC AND ANAEROBIC   Special Requests   Final    Blood Culture adequate volume Performed at [facility], [address], [place], [place]  [address]    Culture PENDING  Incomplete   Report Status PENDING  Incomplete    Radiology Reports NM Pulmonary Perfusion  Result Date: 12/24/2021 CLINICAL DATA:  82 year old male with respiratory distress, shortness of breath. EXAM: NUCLEAR MEDICINE PERFUSION LUNG SCAN TECHNIQUE: Perfusion images were obtained in multiple projections after intravenous injection of radiopharmaceutical. Ventilation scans intentionally deferred if perfusion scan and chest x-ray adequate for interpretation during COVID 19 epidemic. RADIOPHARMACEUTICALS:  4.4 mCi Tc-39m MAA IV COMPARISON:  Noncontrast CT Chest, Abdomen, and Pelvis 0627 hours today. FINDINGS: In conjunction with the chest CT appearance earlier today the bilateral pulmonary perfusion radiotracer activity is fairly homogeneous and within normal limits. No convincing perfusion defect. IMPRESSION: No pulmonary perfusion abnormality, no evidence of pulmonary embolus. Electronically Signed   By: Genevie Ann M.D.   On: 12/24/2021 11:54   DG Chest Portable 1 View  Result Date: 12/24/2021 CLINICAL DATA:  81 year old male with shortness of breath, respiratory distress. Former smoker. EXAM: PORTABLE CHEST 1 VIEW COMPARISON:  Portable chest 09/13/2021 and earlier. FINDINGS: Portable AP semi upright view at 0432 hours. Stable lung volumes from last year. Cardiac silhouette appears mildly increased. Coronary artery stent is visible. Calcified aortic atherosclerosis. Other mediastinal contours are within normal limits.  Visualized tracheal air column is within normal limits. Chronic bilateral increased interstitial markings demonstrated last year, but superimposed acute basilar predominant increased pulmonary interstitial opacity and trace new pleural fluid in the right minor fissure. No pneumothorax, layering pleural effusion, or consolidation identified. No acute osseous abnormality identified. Partially visible gastric distension in the upper abdomen. IMPRESSION: 1. Acute on chronic pulmonary interstitial opacity with trace pleural fluid in the right minor fissure most compatible with acute pulmonary edema. 2. Mildly increased cardiomegaly from last year. 3. Gas distended stomach.  NG tube decompression might be valuable. Electronically Signed   By: Genevie Ann M.D.   On: 12/24/2021 04:47   CT CHEST ABDOMEN PELVIS WO CONTRAST  Result Date: 12/24/2021 CLINICAL DATA:  81 year old male with history of respiratory distress. Abdominal pain and abdominal distension. EXAM: CT CHEST, ABDOMEN AND PELVIS WITHOUT CONTRAST TECHNIQUE: Multidetector CT imaging of the chest, abdomen and pelvis was performed following the standard protocol without IV contrast. RADIATION DOSE REDUCTION: This exam was performed according to the departmental dose-optimization program which includes automated exposure control, adjustment of the mA and/or kV according to patient size and/or use of iterative reconstruction technique. COMPARISON:  No priors. FINDINGS: CT CHEST FINDINGS Cardiovascular: Heart size is normal. There is no significant pericardial fluid, thickening or pericardial calcification. There is aortic atherosclerosis, as well as atherosclerosis of the great vessels of the mediastinum and the coronary arteries, including calcified atherosclerotic plaque in the left main, left anterior descending, left circumflex and right coronary arteries. Mild calcifications of the aortic valve. Mediastinum/Nodes: Mediastinal or no pathologically enlarged hilar  lymph nodes. Esophagus is unremarkable in appearance. No axillary lymphadenopathy. Lungs/Pleura: Small bilateral pleural effusions lying dependently. Widespread areas of ground-glass attenuation and septal thickening noted in the lungs bilaterally, with areas of peribronchovascular airspace consolidation scattered throughout the lungs, most evident in the periphery of the right lower lobe and right middle lobe. Musculoskeletal: There are no aggressive appearing lytic or blastic lesions noted in the visualized portions of the skeleton. CT ABDOMEN PELVIS FINDINGS Hepatobiliary: Diffuse low attenuation throughout the hepatic parenchyma, indicative of a background of hepatic steatosis. Several small low-attenuation lesions are scattered throughout the liver, largest of which measures up to 1.3 cm in segment 4A, incompletely characterized  on today's non-contrast CT examination, but statistically likely to represent small cysts. 6 mm calcified gallstone in the fundus of the gallbladder. Gallbladder is otherwise unremarkable in appearance. Pancreas: No definite pancreatic mass or peripancreatic fluid collections or inflammatory changes are noted on today's noncontrast CT examination. Spleen: Unremarkable. Adrenals/Urinary Tract: Low-attenuation lesions in both kidneys, incompletely characterized on today's non-contrast CT examination, but statistically likely to represent cysts, largest of which is exophytic extending off the lower pole of the right kidney measuring 7.5 cm in diameter. Mild right and moderate left renal atrophy. No hydroureteronephrosis. Urinary bladder is nearly completely decompressed with a Foley balloon catheter in place. Gas non dependently in the lumen of the urinary bladder is presumably iatrogenic. Bilateral adrenal glands are normal in appearance. Stomach/Bowel: The unenhanced appearance of the stomach is normal. There is no pathologic dilatation of small bowel or colon. Normal appendix.  Vascular/Lymphatic: Aortic atherosclerosis. No lymphadenopathy noted in the abdomen or pelvis. Reproductive: Prostate gland and seminal vesicles are unremarkable in appearance. Other: No significant volume of ascites.  No pneumoperitoneum. Musculoskeletal: There are no aggressive appearing lytic or blastic lesions noted in the visualized portions of the skeleton. IMPRESSION: 1. The appearance the chest is concerning for severe multilobar bilateral bronchopneumonia. 2. Small bilateral pleural effusions lying dependently. 3. No acute findings are noted in the abdomen or pelvis. 4. Aortic atherosclerosis, in addition to left main and three-vessel coronary artery disease. 5. Cholelithiasis without evidence of acute cholecystitis at this time. 6. Additional incidental findings, as above. Electronically Signed   By: Vinnie Langton M.D.   On: 12/24/2021 06:52    SIGNED: Deatra James, MD, FHM. [facility],  Pager (please use [email or web address] to page/text) Please use Epic Secure Chat for non-urgent communication (7AM-7PM)  If 7PM-7AM, please contact night-coverage [email or web address], 12/24/2021, 4:22 PM

## 2021-12-24 NOTE — Progress Notes (Signed)
Patient had to be taken off BIPAP due to vomiting. Placed back on NRB. MD ok with this for now. ?

## 2021-12-24 NOTE — Assessment & Plan Note (Addendum)
-   Baseline creatinine around 2 ?-Mildly elevated now ?-Kidney function closely ?-Fortunately expecting creatinine function to worsen due to aggressive diuretics Lasix ?-Creatinine 2.35 >>>  ?

## 2021-12-24 NOTE — Assessment & Plan Note (Signed)
-   Last echo reviewed 08/2021 ejection fraction around 18%, grade 2 diastolic dysfunction ?-Elevated troponin due to accelerated hypertension, CHF exacerbation, no overt changes in EKG ?-Denies of any chest pain at this time, will continue to monitor ?-As needed nitroglycerin, continuing aspirin and statins, ?

## 2021-12-24 NOTE — H&P (Signed)
History and Physical   Patient: John [name]                            PCP: [name], DO                    DOB: February 16, 1941            DOA: 12/24/2021 ZOX:[id number]             DOS: 12/24/2021, 8:11 AM  [name], DO  Patient coming from:   HOME  I have personally reviewed patient's medical records, in electronic medical records, including:  [facility], and care everywhere.    Chief Complaint:   Chief Complaint  Patient presents with   Respiratory Distress    History of present illness:    Therron Sells is a 81 yo M with PMH/o DMII, OSA, dCHF, BPH, CAD, HTN, HLD CVA, PVD presented with shortness of breath.    acute onset difficulty breathing for the past 2 to 3 hours.  He took his home albuterol without relief.  No home oxygen use.  EMS arrival patient was hypoxic, placed on 15 L nonrebreather with improvement to pulse ox into the low 90s.  90-92.  Patient reports cough, unclear what color the sputum is but does feel it has been productive.  No longer smoking.  Patient denies chest pain, fevers or chills, no abdominal pain, no rashes.  No recent change in medications or diet.    ED:  On arrival SBP> 200 Blood pressure (!) 156/85, pulse 87, temperature 97.6 F (36.4 C), temperature source Axillary, resp. rate (!) 23, height 5\' 6"  (1.676 m), weight 89.2 kg, SpO2 97 %.  ABG : PH 7.16, PCO2 76, bicarb 28.4 BNP 480, BUN 38, creatinine 2.52, WBC 13.3, lactic acid 2.1, 1.0, D-dimer 2.19, troponin 39 - 313  EKG: A-fib with a rate of 97 PVCs Chest x-ray: Pleural fluid, pulmonary edema, cardiomegaly,       Patient Denies having: Fever, Chills, Cough, SOB, Chest Pain, Abd pain, N/V/D, headache, dizziness, lightheadedness,  Dysuria, Joint pain, rash, open wounds  ED Course:   Blood pressure (!) 156/85, pulse 87, temperature 97.6 F (36.4 C), temperature source Axillary, resp. rate (!) 23, height 5\' 6"  (1.676 m), weight 89.2 kg, SpO2 97 %. Abnormal  labs;   Review of Systems: As per HPI, otherwise 10 point review of systems were negative.   ----------------------------------------------------------------------------------------------------------------------  Allergies  Allergen Reactions   Penicillins Hives   Ticagrelor Shortness Of Breath   Ezetimibe-Simvastatin Other (See Comments)    Myalgia     Home MEDs:  Prior to Admission medications   Medication Sig Start Date End Date Taking? Authorizing Provider  albuterol (VENTOLIN HFA) 108 (90 Base) MCG/ACT inhaler Inhale 2 puffs into the lungs every 6 (six) hours as needed for wheezing or shortness of breath. 12/20/20   Pokhrel, Corrie Mckusick, MD  allopurinol (ZYLOPRIM) 300 MG tablet Take 300 mg by mouth daily. 06/21/12   [provider]  aspirin 81 MG tablet Take 81 mg by mouth daily. Take 1 tab daily    [provider]  canagliflozin (INVOKANA) 100 MG TABS tablet Take 100 mg by mouth daily. 04/28/20   [provider]  carvedilol (COREG) 12.5 MG tablet Take 1 tablet (12.5 mg total) by mouth 2 (two) times daily with a meal. 09/18/21   Crenshaw, Denice Bors, MD  cholecalciferol (VITAMIN D3) 25 MCG (1000  UNIT) tablet Take 1 tablet by mouth daily.    [provider]  Cyanocobalamin (VITAMIN B 12 PO) Take 1 tablet by mouth daily.    [provider]  furosemide (LASIX) 20 MG tablet Take 1 tablet (20 mg total) by mouth every other day. May take extra tablet as needed for 2-3 lb weight gain Patient taking differently: Take 20 mg by mouth daily. May take extra tablet as needed for 2-3 lb weight gain 05/08/21 05/08/22  Lelon Perla, MD  gabapentin (NEURONTIN) 300 MG capsule Take 300 mg by mouth 2 (two) times daily. 08/04/19   [provider]  glimepiride (AMARYL) 4 MG tablet Take 4 mg by mouth daily. 05/02/15   [provider]  insulin glargine (LANTUS SOLOSTAR) 100 UNIT/ML Solostar Pen Inject 43 Units into the skin daily. 10/01/19   [provider]  pantoprazole (PROTONIX) 40 MG tablet Take 40 mg by mouth daily. 08/19/14   [provider]  rosuvastatin (CRESTOR) 20 MG tablet Take 1 tablet (20 mg total) by mouth every evening. 12/14/21   Lelon Perla, MD  senna (SENOKOT) 8.6 MG tablet Take 1 tablet by mouth as needed for constipation.    [provider]  sertraline (ZOLOFT) 25 MG tablet Take 1 tablet (25 mg total) by mouth every evening. 01/09/21   Thayer Headings, PMHNP  tamsulosin (FLOMAX) 0.4 MG CAPS capsule Take 0.4 mg by mouth every evening. 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)    BPH (benign prostatic hyperplasia)    CAD (coronary artery disease)    Cholesteatoma of attic    Depression    Diabetes mellitus ([place])    Ear disease    GERD (gastroesophageal reflux disease)    Hyperlipidemia    Hypertension    PVD (peripheral vascular disease) ([place])    Stroke [facility])     Past Surgical History:  Procedure Laterality Date   BONE ANCHORED HEARING AID IMPLANT Left 06/25/2013   Procedure: BONE ANCHORED HEARING AID (BAHA) IMPLANT;  Surgeon: Fannie Knee, MD;  Location: [place];  Service: ENT;  Laterality: Left;   CAROTID STENT INSERTION Left    COCHLEAR IMPLANT     CORONARY STENT PLACEMENT     MASS EXCISION Left 06/25/2013   Procedure: EXCISION LEFT TEMPORAL MASS;  Surgeon: Fannie Knee, MD;  Location: [place];  Service: ENT;  Laterality: Left;   TONSILLECTOMY       reports that he quit smoking about 32 years ago. His smoking use included cigarettes. He has never used smokeless tobacco. He reports that he does not currently use alcohol. He reports that he does not use  drugs.   Family History  Problem Relation Age of Onset   Diabetes Other    Diabetes Mother    Diabetes Maternal Aunt    Depression Other    Depression Other    Drug abuse Daughter    Diabetes Daughter     Physical Exam:   Vitals:   12/24/21 0600 12/24/21 0630 12/24/21 0700 12/24/21 0730  BP: (!) 152/75 (!) 153/94 138/68 (!) 156/85  Pulse: 91 89 89 87  Resp: (!) 29 (!) 38 (!) 41 (!) 23  Temp: 97.6 F (36.4 C)  TempSrc: Axillary     SpO2: 100% 100% 98% 97%  Weight:      Height:       Constitutional: Awake alert, in mild to moderate distress, on BiPAP Eyes: PERRL, lids and conjunctivae normal ENMT: Mucous membranes are moist. Posterior pharynx clear of any exudate or lesions.Normal dentition.  Neck: normal, supple, no masses, no thyromegaly Respiratory: Diffuse rhonchi, lower lobe crackles, positive breath sounds diffusely  cardiovascular: Regular rate and rhythm, no murmurs / rubs / gallops. No extremity edema. 2+ pedal pulses. No carotid bruits.  Abdomen: no tenderness, no masses palpated. No hepatosplenomegaly. Bowel sounds positive.  Musculoskeletal: no clubbing / cyanosis. No joint deformity upper and lower extremities. Good ROM, no contractures. Normal muscle tone.  Neurologic: CN II-XII grossly intact. Sensation intact, DTR normal. Strength 5/5 in all 4.  Psychiatric: Normal judgment and insight. Alert and oriented x 3. Normal mood.  Skin: no rashes, lesions, ulcers. No induration Decubitus/ulcers:  Wounds: per nursing documentation         Labs on admission:    I have personally reviewed following labs and imaging studies  CBC: Recent Labs  Lab 12/24/21 0419  WBC 13.3*  HGB 14.9  HCT 48.2  MCV 94.9  PLT 751   Basic Metabolic Panel: Recent Labs  Lab 12/24/21 0419  NA 137  K 4.2  CL 101  CO2 26  GLUCOSE 263*  BUN 38*  CREATININE 2.52*  CALCIUM 9.0   GFR: Estimated Creatinine Clearance: 23.7 mL/min (A) (by C-G formula based on SCr of  2.52 mg/dL (H)). Liver Function Tests: Recent Labs  Lab 12/24/21 0419  AST 32  ALT 25  ALKPHOS 74  BILITOT 0.9  PROT 8.4*  ALBUMIN 4.2       Component Value Date/Time   COLORURINE YELLOW 12/19/2020 1212   APPEARANCEUR CLEAR 12/19/2020 1212   LABSPEC 1.021 12/19/2020 1212   PHURINE 6.0 12/19/2020 1212   GLUCOSEU >=500 (A) 12/19/2020 1212   HGBUR NEGATIVE 12/19/2020 1212   BILIRUBINUR NEGATIVE 12/19/2020 1212   [place] 12/19/2020 1212   PROTEINUR 100 (A) 12/19/2020 1212   NITRITE NEGATIVE 12/19/2020 1212   [place] 12/19/2020 1212    Last A1C:  Lab Results  Component Value Date   HGBA1C 8.7 (H) 09/13/2021     Radiologic Exams on Admission:   DG Chest Portable 1 View  Result Date: 12/24/2021 CLINICAL DATA:  81 year old male with shortness of breath, respiratory distress. Former smoker. EXAM: PORTABLE CHEST 1 VIEW COMPARISON:  Portable chest 09/13/2021 and earlier. FINDINGS: Portable AP semi upright view at 0432 hours. Stable lung volumes from last year. Cardiac silhouette appears mildly increased. Coronary artery stent is visible. Calcified aortic atherosclerosis. Other mediastinal contours are within normal limits. Visualized tracheal air column is within normal limits. Chronic bilateral increased interstitial markings demonstrated last year, but superimposed acute basilar predominant increased pulmonary interstitial opacity and trace new pleural fluid in the right minor fissure. No pneumothorax, layering pleural effusion, or consolidation identified. No acute osseous abnormality identified. Partially visible gastric distension in the upper abdomen. IMPRESSION: 1. Acute on chronic pulmonary interstitial opacity with trace pleural fluid in the right minor fissure most compatible with acute pulmonary edema. 2. Mildly increased cardiomegaly from last year. 3. Gas distended stomach.  NG tube decompression might be valuable. Electronically Signed   By: Genevie Ann  M.D.   On: 12/24/2021 04:47   CT CHEST ABDOMEN PELVIS WO CONTRAST  Result Date: 12/24/2021 CLINICAL DATA:  81 year old male with  history of respiratory distress. Abdominal pain and abdominal distension. EXAM: CT CHEST, ABDOMEN AND PELVIS WITHOUT CONTRAST TECHNIQUE: Multidetector CT imaging of the chest, abdomen and pelvis was performed following the standard protocol without IV contrast. RADIATION DOSE REDUCTION: This exam was performed according to the departmental dose-optimization program which includes automated exposure control, adjustment of the mA and/or kV according to patient size and/or use of iterative reconstruction technique. COMPARISON:  No priors. FINDINGS: CT CHEST FINDINGS Cardiovascular: Heart size is normal. There is no significant pericardial fluid, thickening or pericardial calcification. There is aortic atherosclerosis, as well as atherosclerosis of the great vessels of the mediastinum and the coronary arteries, including calcified atherosclerotic plaque in the left main, left anterior descending, left circumflex and right coronary arteries. Mild calcifications of the aortic valve. Mediastinum/Nodes: Mediastinal or no pathologically enlarged hilar lymph nodes. Esophagus is unremarkable in appearance. No axillary lymphadenopathy. Lungs/Pleura: Small bilateral pleural effusions lying dependently. Widespread areas of ground-glass attenuation and septal thickening noted in the lungs bilaterally, with areas of peribronchovascular airspace consolidation scattered throughout the lungs, most evident in the periphery of the right lower lobe and right middle lobe. Musculoskeletal: There are no aggressive appearing lytic or blastic lesions noted in the visualized portions of the skeleton. CT ABDOMEN PELVIS FINDINGS Hepatobiliary: Diffuse low attenuation throughout the hepatic parenchyma, indicative of a background of hepatic steatosis. Several small low-attenuation lesions are scattered throughout the  liver, largest of which measures up to 1.3 cm in segment 4A, incompletely characterized on today's non-contrast CT examination, but statistically likely to represent small cysts. 6 mm calcified gallstone in the fundus of the gallbladder. Gallbladder is otherwise unremarkable in appearance. Pancreas: No definite pancreatic mass or peripancreatic fluid collections or inflammatory changes are noted on today's noncontrast CT examination. Spleen: Unremarkable. Adrenals/Urinary Tract: Low-attenuation lesions in both kidneys, incompletely characterized on today's non-contrast CT examination, but statistically likely to represent cysts, largest of which is exophytic extending off the lower pole of the right kidney measuring 7.5 cm in diameter. Mild right and moderate left renal atrophy. No hydroureteronephrosis. Urinary bladder is nearly completely decompressed with a Foley balloon catheter in place. Gas non dependently in the lumen of the urinary bladder is presumably iatrogenic. Bilateral adrenal glands are normal in appearance. Stomach/Bowel: The unenhanced appearance of the stomach is normal. There is no pathologic dilatation of small bowel or colon. Normal appendix. Vascular/Lymphatic: Aortic atherosclerosis. No lymphadenopathy noted in the abdomen or pelvis. Reproductive: Prostate gland and seminal vesicles are unremarkable in appearance. Other: No significant volume of ascites.  No pneumoperitoneum. Musculoskeletal: There are no aggressive appearing lytic or blastic lesions noted in the visualized portions of the skeleton. IMPRESSION: 1. The appearance the chest is concerning for severe multilobar bilateral bronchopneumonia. 2. Small bilateral pleural effusions lying dependently. 3. No acute findings are noted in the abdomen or pelvis. 4. Aortic atherosclerosis, in addition to left main and three-vessel coronary artery disease. 5. Cholelithiasis without evidence of acute cholecystitis at this time. 6. Additional  incidental findings, as above. Electronically Signed   By: Vinnie Langton M.D.   On: 12/24/2021 06:52    EKG:   Independently reviewed.  Orders placed or performed during the hospital encounter of 12/24/21   EKG 12-Lead   EKG 12-Lead   EKG 12-Lead   EKG 12-Lead   EKG 12-Lead   EKG 12-Lead   EKG 12-Lead   ---------------------------------------------------------------------------------------------------------------------------------------    Assessment / Plan:   Principal Problem:   Acute respiratory failure with hypoxia and hypercapnia (HCC)  Active Problems:   Acute exacerbation of CHF (congestive heart failure) (HCC)   Hypoxia   Essential hypertension, malignant   D-dimer, elevated   CAD (coronary artery disease) of artery bypass graft   Cardiomyopathy, ischemic   Mixed hyperlipidemia   Peripheral vascular disease (HCC)   Coronary atherosclerosis of native coronary artery   Elevated troponin   Obstructive sleep apnea   BPH (benign prostatic hyperplasia)   CKD (chronic kidney disease), stage III (HCC)   Diabetic neuropathy (HCC)   Assessment and Plan: * Acute respiratory failure with hypoxia and hypercapnia (HCC) - Multifactorial likely diastolic CHF exacerbation, volume overload, possible pneumonia -Treating aggressively with diuretics Lasix -Patient was initially placed on BiPAP, could not tolerate, due to vomiting now on nonrebreather Xarelto mask, satting 100% -Continue [name] flow oxygen via nonrebreather mask, will wean down slowly -Initiating IV steroids -Continue IV Lasix -DuoNeb bronchodilators -IV antibiotics -Repeating ABG, imaging including chest x-ray or CT scan if patient condition does not improve  Hypoxia - Multifactorial likely diastolic CHF exacerbation, congestion, volume overload, possible pneumonia -Treating underlying cause including volume overload with diuretics, antibiotics for pneumonia -Continue nonrebreather mask, monitoring  CBG -DuoNeb bronchodilators  Acute exacerbation of CHF (congestive heart failure) (HCC) - Elevated BNP, chest x-ray consistent with congestive Lower extremity edema --finding consistent with CHF exacerbation -Last echocardiogram from 08/2021 was reviewed, ejection fraction around 10%, with 2 diastolic dysfunction -We will not repeat echocardiogram unless patient progressed to worsening -Continue IV Lasix 40 twice daily, -We will monitor I's and O's, daily weight, creatinine function -We will continue home medication including beta-blockers, not on ACE inhibitors likely due to CKD  Essential hypertension, malignant - History of underlying hypertension currently accelerated hypertension  -On arrival SBP > 200 -Has been started on nitroglycerin drip, BP continue to improve... Planning to wean off nitroglycerin drip -Restarting home medication Coreg -Continue as needed hydralazine  D-dimer, elevated - Likely acute reactant -Due to CKD, obtaining V/Q nuclear scan -Shortness with hypoxia most likely due to CHF exacerbation, congestion, pneumonia, -We will monitor closely  Diabetic neuropathy (HCC) - Stable currently not on any medication -Strict glycemic control  CKD (chronic kidney disease), stage III (HCC) - Baseline creatinine around 2 -Mildly elevated now -Kidney function closely -Fortunately expecting creatinine function to worsen due to aggressive diuretics Lasix -Creatinine 2.35 >>>   BPH (benign prostatic hyperplasia) - Monitoring, PSA -Continue Flomax  Obstructive sleep apnea - Noncompliant with CPAP  -Not on supplemental O2 at home -Courage patient to be compliant with his home CPAP -Could not tolerate BiPAP currently on [name] flow O2 tolerating  Elevated troponin - Likely demand ischemia -Recycling cardiac enzymes -Currently not complaining of chest pain -As needed nitroglycerin, continue home medication of aspirin, statins, beta-blockers -Last echo 08/2021  reviewed -No significant changes on EKG, repeating  Coronary atherosclerosis of native coronary artery - Continue home medication -Last echo reviewed as above  Peripheral vascular disease ([place]) - Continue aspirin, continue statins  Mixed hyperlipidemia - Continue Crestor  Cardiomyopathy, ischemic - Last echo reviewed 08/2021 ejection fraction around 93%, grade 2 diastolic dysfunction -Elevated troponin due to accelerated hypertension, CHF exacerbation, no overt changes in EKG -Denies of any chest pain at this time, will continue to monitor -As needed nitroglycerin, continuing aspirin and statins,  CAD (coronary artery disease) of artery bypass graft - History of CABG, continue aspirin, continue beta-blockers continue statins       Consults called:  None  -------------------------------------------------------------------------------------------------------------------------------------------- DVT prophylaxis:  heparin injection 5,000 Units Start:  12/24/21 1400 TED hose Start: 12/24/21 0727 SCDs Start: 12/24/21 0727   Code Status:   Code Status: Full Code   Admission status: Patient will be admitted as Inpatient, with a greater than 2 midnight length of stay. Level of care: Stepdown   Family Communication:  none at bedside  (The above findings and plan of care has been discussed with patient in detail, the patient expressed understanding and agreement of above plan)  --------------------------------------------------------------------------------------------------------------------------------------------------  Disposition Plan: >3 days Status is: Inpatient Remains inpatient appropriate because: Needing ICU admission for acute respiratory failure, BiPAP, [name] flow oxygen, IV antibiotics, IV steroids, IV  diuretics              ----------------------------------------------------------------------------------------------------------------------------------------------------  Time spent: > than  75  Min.  Of critical time was spent evaluating and stabilizing this patient  SIGNED: Deatra James, MD, FHM. [facility],  Pager (Please use [email or web address] to page to text)  If 7PM-7AM, please contact night-coverage [email or web address],  12/24/2021, 8:11 AM

## 2021-12-24 NOTE — Assessment & Plan Note (Signed)
-   History of underlying hypertension currently accelerated hypertension  ?-On arrival SBP > 200 ?-Has been started on nitroglycerin drip, BP continue to improve... Planning to wean off nitroglycerin drip ?-Restarting home medication Coreg ?-Continue as needed hydralazine ?

## 2021-12-24 NOTE — Assessment & Plan Note (Signed)
-   Likely acute reactant ?-Due to CKD, obtaining V/Q nuclear scan ?-Shortness with hypoxia most likely due to CHF exacerbation, congestion, pneumonia, ?-We will monitor closely ?

## 2021-12-24 NOTE — ED Provider Notes (Signed)
[facility] EMERGENCY DEPARTMENT Provider Note   CSN: [id number] Arrival date & time: 12/24/21  0417     History  Chief Complaint  Patient presents with   Respiratory Distress    John [name] is a 81 y.o. male.  This is a 81 y.o. male with significant medical history as below, including BPH, CAD, diabetes, hyperlipidemia, hypertension, CVA, PVD who presents to the ED with complaint of respiratory distress.  Per patient acute onset difficulty breathing for the past 2 to 3 hours.  He took his home albuterol without relief.  No home oxygen use.  No home CPAP use.  On EMS arrival patient was hypoxic, placed on 15 L nonrebreather with improvement to pulse ox into the low 90s.  90-92.  Patient reports cough, unclear what color the sputum is but does feel it has been productive.  No longer smoking.  Patient denies chest pain, fevers or chills, no abdominal pain, no rashes.  No recent change in medications or diet.  No trauma.  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 No date: GERD (gastroesophageal reflux disease) No date: Hyperlipidemia No date: Hypertension No date: PVD (peripheral vascular disease) ([place]) No date: Stroke [facility])  Past Surgical History: 06/25/2013: BONE ANCHORED HEARING AID IMPLANT; Left     Comment:  Procedure: BONE ANCHORED HEARING AID (BAHA) IMPLANT;                Surgeon: Fannie Knee, MD;  Location: [place];  Service: ENT;  Laterality: Left; No date: CAROTID STENT INSERTION; Left No date: COCHLEAR IMPLANT No date: CORONARY STENT PLACEMENT 06/25/2013: MASS EXCISION; Left     Comment:  Procedure: EXCISION LEFT TEMPORAL MASS;  Surgeon: Fannie Knee, MD;  Location: [place];                Service: ENT;  Laterality: Left; No date: TONSILLECTOMY    The history is provided by the patient and the EMS personnel. The history is limited by the condition of the patient. No language interpreter was used.      Home Medications Prior to Admission medications   Medication Sig Start Date End Date Taking? Authorizing Provider  albuterol (VENTOLIN HFA) 108 (90 Base) MCG/ACT inhaler Inhale 2 puffs into the lungs every 6 (six) hours as needed for wheezing or shortness of breath. 12/20/20   Pokhrel, Corrie Mckusick, MD  allopurinol (ZYLOPRIM) 300 MG tablet Take 300 mg by mouth daily. 06/21/12   [provider]  aspirin 81 MG tablet Take 81 mg by mouth daily. Take 1 tab daily    [provider]  canagliflozin (INVOKANA) 100 MG TABS tablet Take 100 mg by mouth daily. 04/28/20   [provider]  carvedilol (COREG) 12.5 MG tablet Take 1 tablet (12.5 mg total) by mouth 2 (two) times daily with a meal. 09/18/21   Crenshaw, Denice Bors, MD  cholecalciferol (VITAMIN D3) 25 MCG (1000 UNIT) tablet Take 1 tablet by mouth daily.    [provider]  Cyanocobalamin (VITAMIN B 12 PO) Take 1 tablet by mouth daily.    [provider]  furosemide (LASIX) 20 MG tablet Take 1 tablet (20 mg total) by mouth every other day. May take extra tablet as needed for 2-3 lb weight gain Patient taking differently: Take 20 mg by mouth daily. May take extra tablet as needed for 2-3 lb weight gain 05/08/21 05/08/22  Lelon Perla, MD  gabapentin (NEURONTIN) 300 MG capsule Take 300 mg by mouth 2 (two) times daily. 08/04/19   [provider]  glimepiride (AMARYL) 4 MG tablet Take 4 mg by mouth daily. 05/02/15   [provider]  insulin glargine (LANTUS SOLOSTAR) 100 UNIT/ML Solostar Pen Inject 43 Units into the skin daily. 10/01/19   [provider]  pantoprazole (PROTONIX) 40 MG tablet Take 40 mg by mouth daily. 08/19/14   [provider]  rosuvastatin (CRESTOR) 20 MG tablet Take 1 tablet (20 mg total) by mouth every evening. 12/14/21   Lelon Perla, MD  senna (SENOKOT) 8.6 MG tablet Take 1 tablet by mouth as needed for constipation.    [provider]  sertraline (ZOLOFT) 25 MG tablet Take 1 tablet (25 mg total) by mouth every evening. 01/09/21   Thayer Headings, PMHNP  tamsulosin (FLOMAX) 0.4 MG CAPS capsule Take 0.4 mg by mouth every evening. 09/22/14   [provider]      Allergies    Penicillins, Ticagrelor, and Ezetimibe-simvastatin    Review of Systems   Review of Systems  Unable to perform ROS: Severe respiratory distress  Respiratory:  Positive for cough and shortness of breath.    Physical Exam Updated Vital Signs BP 138/68    Pulse 89    Temp 97.6 F (36.4 C) (Axillary)    Resp (!) 41    Ht 5\' 6"  (1.676 m)    Wt 89.2 kg    SpO2 98%    BMI 31.74 kg/m  Physical Exam Vitals and nursing note reviewed.  Constitutional:      General: He is in acute distress.     Appearance: He is well-developed. He is obese. He is ill-appearing and diaphoretic.  HENT:     Head: Normocephalic and atraumatic.     Right Ear: External ear normal.     Left Ear: External ear normal.     Mouth/Throat:     Mouth: Mucous membranes are dry.  Eyes:     General: No scleral icterus. Cardiovascular:     Rate and Rhythm: Regular rhythm. Tachycardia present.     Pulses: Normal pulses.     Heart sounds: Normal heart sounds.  Pulmonary:     Effort: Tachypnea, accessory muscle usage and respiratory distress present.     Breath sounds: Decreased air movement present. Decreased breath sounds and rales present.     Comments: Severely diminished breath sounds bilateral Abdominal:     General: Abdomen is flat. There is distension.     Palpations: Abdomen is soft.     Tenderness: There is no abdominal tenderness.  Musculoskeletal:        General: Normal range of motion.     Cervical back: Normal range of  motion.     Right lower leg: No edema.     Left lower leg: No edema.  Skin:    General: Skin is warm.     Capillary Refill: Capillary refill takes less than 2 seconds.  Neurological:     Mental Status: He is alert and oriented to person, place, and time.     GCS: GCS eye subscore is 4.  GCS verbal subscore is 5. GCS motor subscore is 6.  Psychiatric:        Mood and Affect: Mood normal.        Behavior: Behavior normal.    ED Results / Procedures / Treatments   Labs (all labs ordered are listed, but only abnormal results are displayed) Labs Reviewed  BLOOD GAS, ARTERIAL - Abnormal; Notable for the following components:      Result Value   pH, Arterial 7.18 (*)    pCO2 arterial 76 (*)    Bicarbonate 28.4 (*)    All other components within normal limits  BRAIN NATRIURETIC PEPTIDE - Abnormal; Notable for the following components:   B Natriuretic Peptide 480.0 (*)    All other components within normal limits  COMPREHENSIVE METABOLIC PANEL - Abnormal; Notable for the following components:   Glucose, Bld 263 (*)    BUN 38 (*)    Creatinine, Ser 2.52 (*)    Total Protein 8.4 (*)    GFR, Estimated 25 (*)    All other components within normal limits  CBC - Abnormal; Notable for the following components:   WBC 13.3 (*)    RDW 16.0 (*)    nRBC 0.4 (*)    All other components within normal limits  LACTIC ACID, PLASMA - Abnormal; Notable for the following components:   Lactic Acid, Venous 2.1 (*)    All other components within normal limits  D-DIMER, QUANTITATIVE - Abnormal; Notable for the following components:   D-Dimer, Quant 2.19 (*)    All other components within normal limits  TROPONIN I (HIGH SENSITIVITY) - Abnormal; Notable for the following components:   Troponin I (High Sensitivity) 39 (*)    All other components within normal limits  TROPONIN I (HIGH SENSITIVITY) - Abnormal; Notable for the following components:   Troponin I (High Sensitivity) 313 (*)    All other  components within normal limits  RESP PANEL BY RT-PCR (FLU A&B, COVID) ARPGX2  EXPECTORATED SPUTUM ASSESSMENT W GRAM STAIN, RFLX TO RESP C  LACTIC ACID, PLASMA  URINALYSIS, ROUTINE W REFLEX MICROSCOPIC  BLOOD GAS, VENOUS  HEMOGLOBIN A1C    EKG EKG Interpretation  Date/Time:  Sunday December 24 2021 05:23:25 EST Ventricular Rate:  90 PR Interval:  129 QRS Duration: 126 QT Interval:  416 QTC Calculation: 462 R Axis:   71 Text Interpretation: Atrial fibrillation Paired ventricular premature complexes Left bundle branch block Confirmed by Wynona Dove ([id number]) on 12/24/2021 5:30:39 AM  Radiology DG Chest Portable 1 View  Result Date: 12/24/2021 CLINICAL DATA:  81 year old male with shortness of breath, respiratory distress. Former smoker. EXAM: PORTABLE CHEST 1 VIEW COMPARISON:  Portable chest 09/13/2021 and earlier. FINDINGS: Portable AP semi upright view at 0432 hours. Stable lung volumes from last year. Cardiac silhouette appears mildly increased. Coronary artery stent is visible. Calcified aortic atherosclerosis. Other mediastinal contours are within normal limits. Visualized tracheal air column is within normal limits. Chronic bilateral increased interstitial markings demonstrated last year, but superimposed acute basilar predominant increased pulmonary interstitial opacity and trace new pleural fluid in the right minor fissure. No pneumothorax, layering pleural effusion, or consolidation identified. No acute osseous abnormality identified. Partially visible gastric distension in the upper abdomen. IMPRESSION: 1. Acute on chronic pulmonary interstitial opacity with trace pleural fluid in the right minor fissure most compatible with acute pulmonary edema. 2. Mildly increased cardiomegaly from last year. 3. Gas distended stomach.  NG tube decompression might be valuable. Electronically Signed   By:  Genevie Ann M.D.   On: 12/24/2021 04:47   CT CHEST ABDOMEN PELVIS WO CONTRAST  Result Date:  12/24/2021 CLINICAL DATA:  81 year old male with history of respiratory distress. Abdominal pain and abdominal distension. EXAM: CT CHEST, ABDOMEN AND PELVIS WITHOUT CONTRAST TECHNIQUE: Multidetector CT imaging of the chest, abdomen and pelvis was performed following the standard protocol without IV contrast. RADIATION DOSE REDUCTION: This exam was performed according to the departmental dose-optimization program which includes automated exposure control, adjustment of the mA and/or kV according to patient size and/or use of iterative reconstruction technique. COMPARISON:  No priors. FINDINGS: CT CHEST FINDINGS Cardiovascular: Heart size is normal. There is no significant pericardial fluid, thickening or pericardial calcification. There is aortic atherosclerosis, as well as atherosclerosis of the great vessels of the mediastinum and the coronary arteries, including calcified atherosclerotic plaque in the left main, left anterior descending, left circumflex and right coronary arteries. Mild calcifications of the aortic valve. Mediastinum/Nodes: Mediastinal or no pathologically enlarged hilar lymph nodes. Esophagus is unremarkable in appearance. No axillary lymphadenopathy. Lungs/Pleura: Small bilateral pleural effusions lying dependently. Widespread areas of ground-glass attenuation and septal thickening noted in the lungs bilaterally, with areas of peribronchovascular airspace consolidation scattered throughout the lungs, most evident in the periphery of the right lower lobe and right middle lobe. Musculoskeletal: There are no aggressive appearing lytic or blastic lesions noted in the visualized portions of the skeleton. CT ABDOMEN PELVIS FINDINGS Hepatobiliary: Diffuse low attenuation throughout the hepatic parenchyma, indicative of a background of hepatic steatosis. Several small low-attenuation lesions are scattered throughout the liver, largest of which measures up to 1.3 cm in segment 4A, incompletely  characterized on today's non-contrast CT examination, but statistically likely to represent small cysts. 6 mm calcified gallstone in the fundus of the gallbladder. Gallbladder is otherwise unremarkable in appearance. Pancreas: No definite pancreatic mass or peripancreatic fluid collections or inflammatory changes are noted on today's noncontrast CT examination. Spleen: Unremarkable. Adrenals/Urinary Tract: Low-attenuation lesions in both kidneys, incompletely characterized on today's non-contrast CT examination, but statistically likely to represent cysts, largest of which is exophytic extending off the lower pole of the right kidney measuring 7.5 cm in diameter. Mild right and moderate left renal atrophy. No hydroureteronephrosis. Urinary bladder is nearly completely decompressed with a Foley balloon catheter in place. Gas non dependently in the lumen of the urinary bladder is presumably iatrogenic. Bilateral adrenal glands are normal in appearance. Stomach/Bowel: The unenhanced appearance of the stomach is normal. There is no pathologic dilatation of small bowel or colon. Normal appendix. Vascular/Lymphatic: Aortic atherosclerosis. No lymphadenopathy noted in the abdomen or pelvis. Reproductive: Prostate gland and seminal vesicles are unremarkable in appearance. Other: No significant volume of ascites.  No pneumoperitoneum. Musculoskeletal: There are no aggressive appearing lytic or blastic lesions noted in the visualized portions of the skeleton. IMPRESSION: 1. The appearance the chest is concerning for severe multilobar bilateral bronchopneumonia. 2. Small bilateral pleural effusions lying dependently. 3. No acute findings are noted in the abdomen or pelvis. 4. Aortic atherosclerosis, in addition to left main and three-vessel coronary artery disease. 5. Cholelithiasis without evidence of acute cholecystitis at this time. 6. Additional incidental findings, as above. Electronically Signed   By: Vinnie Langton  M.D.   On: 12/24/2021 06:52    Procedures .Critical Care Performed by: Jeanell Sparrow, DO Authorized by: Jeanell Sparrow, DO   Critical care provider statement:    Critical care time (minutes):  84   Critical care time was exclusive of:  Separately billable procedures and treating other patients   Critical care was necessary to treat or prevent imminent or life-threatening deterioration of the following conditions:  Respiratory failure   Critical care was time spent personally by me on the following activities:  Development of treatment plan with patient or surrogate, discussions with consultants, evaluation of patient's response to treatment, examination of patient, ordering and review of laboratory studies, ordering and review of radiographic studies, ordering and performing treatments and interventions, pulse oximetry, re-evaluation of patient's condition, review of old charts and obtaining history from patient or surrogate   Care discussed with: admitting provider      Medications Ordered in ED Medications  nitroGLYCERIN 50 mg in dextrose 5 % 250 mL (0.2 mg/mL) infusion (5 mcg/min Intravenous New Bag/Given 12/24/21 0426)  levofloxacin (LEVAQUIN) IVPB 750 mg (has no administration in time range)  aspirin tablet 325 mg (has no administration in time range)  heparin injection 5,000 Units (has no administration in time range)  sodium chloride flush (NS) 0.9 % injection 3 mL (has no administration in time range)  sodium chloride flush (NS) 0.9 % injection 3 mL (has no administration in time range)  acetaminophen (TYLENOL) tablet 650 mg (has no administration in time range)    Or  acetaminophen (TYLENOL) suppository 650 mg (has no administration in time range)  oxyCODONE (Oxy IR/ROXICODONE) immediate release tablet 5 mg (has no administration in time range)  HYDROmorphone (DILAUDID) injection 0.5-1 mg (has no administration in time range)  traZODone (DESYREL) tablet 25 mg (has no  administration in time range)  senna-docusate (Senokot-S) tablet 1 tablet (has no administration in time range)  bisacodyl (DULCOLAX) EC tablet 5 mg (has no administration in time range)  ondansetron (ZOFRAN) tablet 4 mg (has no administration in time range)    Or  ondansetron (ZOFRAN) injection 4 mg (has no administration in time range)  ipratropium (ATROVENT) nebulizer solution 0.5 mg (has no administration in time range)  levalbuterol (XOPENEX) nebulizer solution 0.63 mg (has no administration in time range)  hydrALAZINE (APRESOLINE) injection 10 mg (has no administration in time range)  insulin aspart (novoLOG) injection 0-9 Units (has no administration in time range)  0.9 %  sodium chloride infusion (has no administration in time range)  furosemide (LASIX) injection 40 mg (has no administration in time range)  furosemide (LASIX) injection 40 mg (40 mg Intravenous Given 12/24/21 0426)  levalbuterol (XOPENEX) 0.63 MG/3ML nebulizer solution (0.63 mg  Given 12/24/21 0427)  ipratropium (ATROVENT) 0.02 % nebulizer solution (0.5 mg  Given 12/24/21 0427)  ondansetron (ZOFRAN) injection 4 mg (4 mg Intravenous Given 12/24/21 0521)  ketorolac (TORADOL) 15 MG/ML injection 15 mg (15 mg Intravenous Given 12/24/21 0960)    ED Course/ Medical Decision Making/ A&P                           Medical Decision Making Amount and/or Complexity of Data Reviewed Labs: ordered. Radiology: ordered.  Risk OTC drugs. Prescription drug management. Decision regarding hospitalization.    CC: Respiratory distress  This patient presents to the Emergency Department for the above complaint. This involves an extensive number of treatment options and is a complaint that carries with it a high risk of complications and morbidity. Vital signs were reviewed. Serious etiologies considered.  Serious etiology was considered, differential includes was not limited to, acute pulm edema, ACS, PE, infectious etiology, CHF  exacerbation  Patient with profound respiratory distress on arrival.  Conversational dyspnea,  hypoxic.  Arrived on 15 L nonrebreather.  Will transition to BiPAP.  Patient hypertensive, concern for volume overload and diminished air movement and Rales on exam.  Distended abdomen.   Start nitroglycerin infusion, give Lasix.  Patient tolerating BiPAP.  Chest x-ray obtained at bedside, preliminary read by myself concerning for pulmonary edema.  Awaiting official radiology interpretation.   Record review:  Previous records obtained and reviewed   Additional history obtained from EMS  Medical and surgical history as noted above.   Work up as above, notable for:  Labs & imaging results that were available during my care of the patient were visualized by me and considered in my medical decision making.   I ordered imaging studies which included CXR, CT abdomen pelvis and I visualized the imaging and I agree with radiologist interpretation.  Bronchopneumonia, cholelithiasis without cholecystitis  Cardiac monitoring reviewed and interpreted personally which shows sinus tachycardia  Social determinants of health include - N/a  Management: Nitroglycerin infusion, Lasix, BiPAP, nebulized breathing treatments, levaquin  Reassessment:  BP improving on nitro GTT  Has come off of the BiPAP.  Respiratory status much improved.  He is on non-rebreather and resp status is stable.  May potentially have to resume BiPAP, repeat blood gas pending.   He has elevated troponin, favor demand ischemia 2/2 respiratory distress. EKG stable. Would recommend to trend the troponin. ASA was given  Patient with likely acute pulmonary edema possibly secondary to medication noncompliance with Lasix versus acute infection.  I did start Levaquin for presumed bronchopneumonia noted on imaging given penicillin allergy.  Recommend admission.  Patient and family are agreeable. D/w hospitalist who accepts pt for admission.     This chart was dictated using voice recognition software.  Despite best efforts to proofread,  errors can occur which can change the documentation meaning.         Final Clinical Impression(s) / ED Diagnoses Final diagnoses:  Acute pulmonary edema ([place])  Bronchopneumonia  Acute respiratory failure with hypoxia and hypercapnia [facility])    Rx / DC Orders ED Discharge Orders     None         Jeanell Sparrow, DO 12/24/21 0732

## 2021-12-24 NOTE — Assessment & Plan Note (Signed)
-   Continue aspirin, continue statins ?

## 2021-12-24 NOTE — Hospital Course (Signed)
John [name] is a 81 yo M with PMH/o DMII, OSA, dCHF, BPH, CAD, HTN, HLD CVA, PVD presented with shortness of breath.  ? ? ?acute onset difficulty breathing for the past 2 to 3 hours.  He took his home albuterol without relief.  No home oxygen use. ? ?EMS arrival patient was hypoxic, placed on 15 L nonrebreather with improvement to pulse ox into the low 90s.  90-92.  Patient reports cough, unclear what color the sputum is but does feel it has been productive.  No longer smoking.  Patient denies chest pain, fevers or chills, no abdominal pain, no rashes.  No recent change in medications or diet. ? ? ? ?ED:  ?On arrival SBP> 200 ?Blood pressure (!) 156/85, pulse 87, temperature 97.6 ?F (36.4 ?C), temperature source Axillary, resp. rate (!) 23, height 5\' 6"  (1.676 m), weight 89.2 kg, SpO2 97 %. ? ?ABG : PH 7.16, PCO2 76, bicarb 28.4 ?BNP 480, BUN 38, creatinine 2.52, WBC 13.3, lactic acid 2.1, 1.0, D-dimer 2.19, troponin 39 - 313 ? ?EKG: A-fib with a rate of 97 PVCs ?Chest x-ray: Pleural fluid, pulmonary edema, cardiomegaly, ? ? ?

## 2021-12-24 NOTE — ED Notes (Signed)
Critical value lactic acid 2.1 reported to edp [facility] at this time.  ?

## 2021-12-24 NOTE — Progress Notes (Signed)
ANTICOAGULATION CONSULT NOTE - Initial Consult ? ?Pharmacy Consult for Heparin ?Indication: chest pain/ACS ? ?Allergies  ?Allergen Reactions  ? Penicillins Hives  ? Ticagrelor Shortness Of Breath  ? Ezetimibe-Simvastatin Other (See Comments)  ?  Myalgia ?  ? ? ?Patient Measurements: ?Height: 5\' 6"  (167.6 cm) ?Weight: 89.2 kg (196 lb 10.4 oz) ?IBW/kg (Calculated) : 63.8 ?HEPARIN DW (KG): 82.6  ? ?Vital Signs: ?Temp: 97.6 ?F (36.4 ?C) (03/05 0600) ?Temp Source: Axillary (03/05 0600) ?BP: 138/81 (03/05 1200) ?Pulse Rate: 85 (03/05 1200) ? ?Labs: ?Recent Labs  ?  12/24/21 ?0419 12/24/21 ?0635 12/24/21 ?1433  ?HGB 14.9  --   --   ?HCT 48.2  --   --   ?PLT 179  --   --   ?CREATININE 2.52*  --   --   ?TROPONINIHS 39* 313* >24,000*  ? ? ?Estimated Creatinine Clearance: 23.7 mL/min (A) (by C-G formula based on SCr of 2.52 mg/dL (H)). ? ? ?Medical History: ?Past Medical History:  ?Diagnosis Date  ? Anxiety   ? 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 BPH (benign prostatic hyperplasia)   ? CAD (coronary artery disease)   ? Cholesteatoma of attic   ? Depression   ? Diabetes mellitus ([place])   ? Ear disease   ? GERD (gastroesophageal reflux disease)   ? Hyperlipidemia   ? Hypertension   ? PVD (peripheral vascular disease) ([place])   ? Stroke [facility])   ? ? ?Medications:  ?See med rec ? ?Assessment: ?Patient presented with SOB. No chest pain. Troponins are elevated. Patient is not on oral anticoagulants. Pharmacy asked to start heparin. ? ?Goal of Therapy:  ?Heparin level 0.3-0.7 units/ml ?Monitor platelets by anticoagulation protocol: Yes ?  ?Plan:  ?Give 4000 units bolus x 1 ?Start heparin infusion at 1000 units/hr ?Check anti-Xa level in ~8 hours and daily while on heparin ?Continue to monitor H&H and platelets ? ?Isac Sarna, BS Pharm D, BCPS ?Clinical Pharmacist ?Pager [phone] ?12/24/2021,4:18 PM ? ? ?

## 2021-12-24 NOTE — ED Notes (Signed)
[facility] called to setup transport at this time.  

## 2021-12-25 ENCOUNTER — Inpatient Hospital Stay (HOSPITAL_COMMUNITY): Payer: Medicare Other

## 2021-12-25 DIAGNOSIS — I214 Non-ST elevation (NSTEMI) myocardial infarction: Secondary | ICD-10-CM

## 2021-12-25 LAB — CBC
HCT: 38.4 % — ABNORMAL LOW (ref 39.0–52.0)
Hemoglobin: 12.4 g/dL — ABNORMAL LOW (ref 13.0–17.0)
MCH: 29.5 pg (ref 26.0–34.0)
MCHC: 32.3 g/dL (ref 30.0–36.0)
MCV: 91.4 fL (ref 80.0–100.0)
Platelets: 133 10*3/uL — ABNORMAL LOW (ref 150–400)
RBC: 4.2 MIL/uL — ABNORMAL LOW (ref 4.22–5.81)
RDW: 15.8 % — ABNORMAL HIGH (ref 11.5–15.5)
WBC: 11.9 10*3/uL — ABNORMAL HIGH (ref 4.0–10.5)
nRBC: 0 % (ref 0.0–0.2)

## 2021-12-25 LAB — BASIC METABOLIC PANEL
Anion gap: 12 (ref 5–15)
Anion gap: 13 (ref 5–15)
BUN: 51 mg/dL — ABNORMAL HIGH (ref 8–23)
BUN: 57 mg/dL — ABNORMAL HIGH (ref 8–23)
CO2: 23 mmol/L (ref 22–32)
CO2: 23 mmol/L (ref 22–32)
Calcium: 8.6 mg/dL — ABNORMAL LOW (ref 8.9–10.3)
Calcium: 8.7 mg/dL — ABNORMAL LOW (ref 8.9–10.3)
Chloride: 103 mmol/L (ref 98–111)
Chloride: 99 mmol/L (ref 98–111)
Creatinine, Ser: 3.37 mg/dL — ABNORMAL HIGH (ref 0.61–1.24)
Creatinine, Ser: 3.54 mg/dL — ABNORMAL HIGH (ref 0.61–1.24)
GFR, Estimated: 17 mL/min — ABNORMAL LOW (ref >60)
GFR, Estimated: 16 mL/min — ABNORMAL LOW (ref >60)
Glucose, Bld: 75 mg/dL (ref 70–99)
Glucose, Bld: 196 mg/dL — ABNORMAL HIGH (ref 70–99)
Potassium: 4.3 mmol/L (ref 3.5–5.1)
Potassium: 5 mmol/L (ref 3.5–5.1)
Sodium: 138 mmol/L (ref 135–145)
Sodium: 135 mmol/L (ref 135–145)

## 2021-12-25 LAB — BRAIN NATRIURETIC PEPTIDE: B Natriuretic Peptide: 1009.5 pg/mL — ABNORMAL HIGH (ref 0.0–100.0)

## 2021-12-25 LAB — GLUCOSE, CAPILLARY
Glucose-Capillary: 156 mg/dL — ABNORMAL HIGH (ref 70–99)
Glucose-Capillary: 201 mg/dL — ABNORMAL HIGH (ref 70–99)
Glucose-Capillary: 171 mg/dL — ABNORMAL HIGH (ref 70–99)
Glucose-Capillary: 233 mg/dL — ABNORMAL HIGH (ref 70–99)

## 2021-12-25 LAB — HEPARIN LEVEL (UNFRACTIONATED)
Heparin Unfractionated: <0.1 IU/mL — ABNORMAL LOW (ref 0.30–0.70)
Heparin Unfractionated: 0.26 IU/mL — ABNORMAL LOW (ref 0.30–0.70)
Heparin Unfractionated: 0.28 IU/mL — ABNORMAL LOW (ref 0.30–0.70)

## 2021-12-25 LAB — ECHOCARDIOGRAM COMPLETE
AR max vel: 1.81 cm2
AV Peak grad: 8.1 mmHg
Ao pk vel: 1.42 m/s
Area-P 1/2: 4.77 cm2
Calc EF: 42.3 %
Height: 66 in
S' Lateral: 4.2 cm
Single Plane A2C EF: 39.5 %
Single Plane A4C EF: 43.7 %
Weight: 3238.12 oz

## 2021-12-25 LAB — PROTIME-INR
INR: 1.1 (ref 0.8–1.2)
Prothrombin Time: 14.2 seconds (ref 11.4–15.2)

## 2021-12-25 LAB — MAGNESIUM: Magnesium: 2.2 mg/dL (ref 1.7–2.4)

## 2021-12-25 MED ORDER — PERFLUTREN LIPID MICROSPHERE
1.0000 mL | INTRAVENOUS | Status: AC | PRN
  Administered 2021-12-25: 2 mL via INTRAVENOUS
  Filled 2021-12-25: qty 10

## 2021-12-25 MED ORDER — THIAMINE HCL 100 MG/ML IJ SOLN: 100.0000 mg | Freq: Every day | INTRAMUSCULAR | Status: DC

## 2021-12-25 MED ORDER — LORAZEPAM 1 MG PO TABS: 1.0000 mg | ORAL_TABLET | ORAL | Status: AC | PRN

## 2021-12-25 MED ORDER — ADULT MULTIVITAMIN W/MINERALS CH
1.0000 | ORAL_TABLET | Freq: Every day | ORAL | Status: DC
  Administered 2021-12-25 – 2021-12-29 (×5): 1 via ORAL
  Filled 2021-12-25 (×5): qty 1

## 2021-12-25 MED ORDER — FOLIC ACID 1 MG PO TABS
1.0000 mg | ORAL_TABLET | Freq: Every day | ORAL | Status: DC
  Administered 2021-12-25 – 2021-12-29 (×5): 1 mg via ORAL
  Filled 2021-12-25 (×5): qty 1

## 2021-12-25 MED ORDER — CHLORHEXIDINE GLUCONATE CLOTH 2 % EX PADS
6.0000 | MEDICATED_PAD | Freq: Every day | CUTANEOUS | Status: DC
  Administered 2021-12-27 – 2021-12-28 (×2): 6 via TOPICAL

## 2021-12-25 MED ORDER — THIAMINE HCL 100 MG PO TABS
100.0000 mg | ORAL_TABLET | Freq: Every day | ORAL | Status: DC
  Administered 2021-12-25 – 2021-12-29 (×5): 100 mg via ORAL
  Filled 2021-12-25 (×5): qty 1

## 2021-12-25 MED ORDER — LORAZEPAM 2 MG/ML IJ SOLN: 1.0000 mg | INTRAMUSCULAR | Status: AC | PRN

## 2021-12-25 MED ORDER — HEPARIN BOLUS VIA INFUSION
2000.0000 [IU] | Freq: Once | INTRAVENOUS | Status: AC
  Administered 2021-12-25: 2000 [IU] via INTRAVENOUS
  Filled 2021-12-25: qty 2000

## 2021-12-25 MED ORDER — HEPARIN (PORCINE) 25000 UT/250ML-% IV SOLN
1300.0000 [IU]/h | INTRAVENOUS | Status: DC
  Administered 2021-12-27: 1300 [IU]/h via INTRAVENOUS
  Filled 2021-12-25 (×2): qty 250

## 2021-12-25 NOTE — Progress Notes (Signed)
ANTICOAGULATION CONSULT NOTE ? ?Pharmacy Consult for IV heparin ?Indication: chest pain/ACS ? ?Allergies  ?Allergen Reactions  ? Penicillins Hives  ? Ticagrelor Shortness Of Breath  ? Ezetimibe-Simvastatin Other (See Comments)  ?  Myalgia ?  ? ? ?Patient Measurements: ?Height: 5\' 6"  (167.6 cm) ?Weight: 91.8 kg (202 lb 6.1 oz) ?IBW/kg (Calculated) : 63.8 ?Heparin Dosing Weight: 82.6 kg ? ?Vital Signs: ?Temp: 98.4 ?F (36.9 ?C) (03/06 2005) ?Temp Source: Oral (03/06 2005) ?BP: 123/65 (03/06 2005) ?Pulse Rate: 78 (03/06 2005) ? ?Labs: ?Recent Labs  ?  12/24/21 ?0419 12/24/21 ?0635 12/24/21 ?1433 12/25/21 ?0122 12/25/21 ?1052 12/25/21 ?2001  ?HGB 14.9  --   --  12.4*  --   --   ?HCT 48.2  --   --  38.4*  --   --   ?PLT 179  --   --  133*  --   --   ?LABPROT  --   --   --  14.2  --   --   ?INR  --   --   --  1.1  --   --   ?HEPARINUNFRC  --   --   --  <0.10* 0.26* 0.28*  ?CREATININE 2.52*  --   --  3.37* 3.54*  --   ?TROPONINIHS 39* 313* >24,000*  --   --   --   ? ? ? ?Estimated Creatinine Clearance: 17.1 mL/min (A) (by C-G formula based on SCr of 3.54 mg/dL (H)). ? ? ?Assessment: ?23 YOM presenting with chest pain, found to have an NSTEMI and started on IV heparin.  Heparin level increased to 0.28 units/mL on 1500 units/hr and remains subtherapeutic.  No complications with heparin infusion nor bleeding per RN. ? ?Goal of Therapy:  ?Heparin level 0.3-0.7 units/ml ?Monitor platelets by anticoagulation protocol: Yes ?  ?Plan:  ?Increase IV heparin to 1700 units/hr ?Check 8 hr heparin level ? ?Hellena Pridgen D. Mina Marble, PharmD, BCPS, BCCCP ?12/25/2021, 9:07 PM ? ? ? ?

## 2021-12-25 NOTE — Progress Notes (Signed)
Mobility Specialist Progress Note ? ? 12/25/21 1652  ?Mobility  ?Activity Ambulated with assistance in room  ?Level of Assistance Minimal assist, patient does 75% or more  ?Assistive Device Front wheel walker  ?Distance Ambulated (ft) 20 ft  ?Activity Response Tolerated well  ?$Mobility charge 1 Mobility  ? 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 Pt requiring min A to lift trunk to EOB but only requiring contact guard for the remainder of the session. x2 bouts of lateral steps along the side of the bed then x1 seated break d/t fatigue. X15 marches to complete session, no complaint of LE pain throughout. Left in bed w/ call bell by side and all needs met.   ? ?Holland Falling ?Mobility Specialist ?Phone Number [phone] ? ?

## 2021-12-25 NOTE — Progress Notes (Signed)
ANTICOAGULATION CONSULT NOTE - Follow Up Consult ? ?Pharmacy Consult for IV heparin ?Indication: chest pain/ACS ? ?Allergies  ?Allergen Reactions  ? Penicillins Hives  ? Ticagrelor Shortness Of Breath  ? Ezetimibe-Simvastatin Other (See Comments)  ?  Myalgia ?  ? ? ?Patient Measurements: ?Height: 5\' 6"  (167.6 cm) ?Weight: 91.8 kg (202 lb 6.1 oz) ?IBW/kg (Calculated) : 63.8 ?Heparin Dosing Weight: 82.6 kg ? ?Vital Signs: ?Temp: 98.4 ?F (36.9 ?C) (03/06 0700) ?Temp Source: Oral (03/06 0700) ?BP: 121/56 (03/06 0800) ?Pulse Rate: 79 (03/06 0800) ? ?Labs: ?Recent Labs  ?  12/24/21 ?0419 12/24/21 ?0635 12/24/21 ?1433 12/25/21 ?0122 12/25/21 ?1052  ?HGB 14.9  --   --  12.4*  --   ?HCT 48.2  --   --  38.4*  --   ?PLT 179  --   --  133*  --   ?LABPROT  --   --   --  14.2  --   ?INR  --   --   --  1.1  --   ?HEPARINUNFRC  --   --   --  <0.10* 0.26*  ?CREATININE 2.52*  --   --  3.37*  --   ?TROPONINIHS 39* 313* >24,000*  --   --   ? ? ?Estimated Creatinine Clearance: 17.9 mL/min (A) (by C-G formula based on SCr of 3.37 mg/dL (H)). ? ? ?Assessment: ?41 YOM presenting with chest pain, found to have NSTEMI. No history of anticoagulation prior to admission. Pharmacy to dose IV heparin.  ? ?Heparin level 0.26 and subtherapeutic after bolus and rate increase earlier in the AM. Hgb slightly decreased to 12 from 14, but still okay. Platelets slightly decreased. No signs of bleeding noted or IV site issues per RN.  ? ?Goal of Therapy:  ?Heparin level 0.3-0.7 units/ml ?Monitor platelets by anticoagulation protocol: Yes ?  ?Plan:  ?Increase IV heparin gtt to 1500 units/h ?8h heparin level ?Daily heparin level, CBC ?Monitor for signs and symptoms of bleeding ?Follow-up further cardiology recommendations ? ?Thank you for involving pharmacy in this patient's care. ? ?Elita Quick, PharmD ?PGY1 Ambulatory Care Pharmacy Resident ?12/25/2021 11:48 AM ? ?**Pharmacist phone directory can be found on [email or web address] listed under [place]** ?

## 2021-12-25 NOTE — Progress Notes (Signed)
PT Cancellation Note ? ?Patient Details ?Name: John [name] ?MRN: [id number] ?DOB: 1941/05/05 ? ? ?Cancelled Treatment:    Reason Eval/Treat Not Completed: Medical issues which prohibited therapy Holding PT evaluation as pt admitted with NSTEMI and started on Heparin but not therapeutic as of yet per PT protocol. MD aware. Awaiting cardiac cath. Will follow. ? ? ?[place] ?12/25/2021, 4:02 PM ?Marisa Severin, PT, DPT ?Acute Rehabilitation Services ?Pager [phone] ?Office [phone] ? ? ? ?

## 2021-12-25 NOTE — Progress Notes (Addendum)
Progress Note  Patient Name: John [name] Date of Encounter: 12/25/2021  Primary Cardiologist: Kirk Ruths MD  Subjective   Patient denies chest pain. Feels his breathing is improved.   Inpatient Medications    Scheduled Meds:  allopurinol  300 mg Oral Daily   aspirin  81 mg Oral Daily   carvedilol  12.5 mg Oral BID WC   Chlorhexidine Gluconate Cloth  6 each Topical Daily   cholecalciferol  1,000 Units Oral Daily   gabapentin  300 mg Oral BID   insulin aspart  0-20 Units Subcutaneous TID WC   insulin glargine-yfgn  43 Units Subcutaneous Daily   methylPREDNISolone (SOLU-MEDROL) injection  80 mg Intravenous Q12H   pantoprazole  40 mg Oral Daily   rosuvastatin  20 mg Oral QPM   sertraline  25 mg Oral QPM   sodium chloride flush  3 mL Intravenous Q12H   sodium chloride flush  3 mL Intravenous Q12H   tamsulosin  0.4 mg Oral QPM   Continuous Infusions:  sodium chloride Stopped (12/24/21 1700)   heparin 1,500 Units/hr (12/25/21 1250)   levofloxacin (LEVAQUIN) IV     nitroGLYCERIN 5 mcg/min (12/25/21 0614)   PRN Meds: sodium chloride, acetaminophen **OR** acetaminophen, bisacodyl, hydrALAZINE, HYDROmorphone (DILAUDID) injection, ipratropium, levalbuterol, ondansetron **OR** ondansetron (ZOFRAN) IV, oxyCODONE, senna-docusate, traZODone   Vital Signs    Vitals:   12/25/21 0521 12/25/21 0700 12/25/21 0800 12/25/21 1209  BP:  113/66 (!) 121/56 131/61  Pulse: 84 74 79 87  Resp: 10 14 16 18   Temp:  98.4 F (36.9 C)  98.4 F (36.9 C)  TempSrc:  Oral  Oral  SpO2: 95% 96% 99% 93%  Weight: 91.8 kg     Height:        Intake/Output Summary (Last 24 hours) at 12/25/2021 1300 Last data filed at 12/25/2021 1211 Gross per 24 hour  Intake 254.73 ml  Output 1350 ml  Net -1095.27 ml    I/O since admission:   Filed Weights   12/24/21 0429 12/25/21 0521  Weight: 89.2 kg 91.8 kg    Telemetry    Rare PVCs, NSR- Personally Reviewed  ECG    ECG (independently read by  me): AM EKG pending  Physical Exam    BP 131/61 (BP Location: Left Arm)    Pulse 87    Temp 98.4 F (36.9 C) (Oral)    Resp 18    Ht 5\' 6"  (1.676 m)    Wt 91.8 kg    SpO2 93%    BMI 32.67 kg/m  General: Alert, oriented, no distress.  Skin: normal turgor, no rashes, warm and dry HEENT: Normocephalic, atraumatic. sclera anicteric;  Nose without nasal septal hypertrophy Mouth/Parynx benign;  Neck: No JVD, no carotid bruits; normal carotid upstroke Lungs: clear to auscultation; no wheezing or rales, on 6L [place] = faint basilar crackles but mostly CTA B Chest wall: without tenderness to palpitation Heart: PMI not displaced, RRR, s1 s2 normal, no diastolic murmur, no rubs, gallops, thrills, or heaves Abdomen: soft, nontender; no hepatosplenomehaly, BS+; no evidence of ascites/abdominal swelling Back: no CVA tenderness Pulses 2+ Musculoskeletal: full range of motion, normal strength, no joint deformities Extremities: no clubbing cyanosis or edema,  Neurologic: grossly nonfocal; Cranial nerves grossly wnl Psychologic: Normal mood and affect    Labs    Chemistry Recent Labs  Lab 12/24/21 0419 12/25/21 0122 12/25/21 1052  NA 137 138 135  K 4.2 4.3 5.0  CL 101 103 99  CO2 26 23  23  GLUCOSE 263* 75 196*  BUN 38* 51* 57*  CREATININE 2.52* 3.37* 3.54*  CALCIUM 9.0 8.6* 8.7*  PROT 8.4*  --   --   ALBUMIN 4.2  --   --   AST 32  --   --   ALT 25  --   --   ALKPHOS 74  --   --   BILITOT 0.9  --   --   GFRNONAA 25* 17* 16*  ANIONGAP 10 12 13      Hematology Recent Labs  Lab 12/24/21 0419 12/25/21 0122  WBC 13.3* 11.9*  RBC 5.08 4.20*  HGB 14.9 12.4*  HCT 48.2 38.4*  MCV 94.9 91.4  MCH 29.3 29.5  MCHC 30.9 32.3  RDW 16.0* 15.8*  PLT 179 133*    Cardiac Enzymes Component Ref Range & Units 1 d ago (12/24/21) 1 d ago (12/24/21) 1 d ago (12/24/21) 3 mo ago (09/13/21) 3 mo ago (09/13/21) 3 mo ago (09/13/21) 3 mo ago (09/13/21)  Troponin I (High Sensitivity) <18 ng/L >24,000  High Panic   313 High Panic  CM  39 High  CM  484 High Panic  CM  489 High Panic  CM  82 High  CM  34 High  CM    BNP Recent Labs  Lab 12/24/21 0419 12/25/21 0122  BNP 480.0* 1,009.5*     DDimer  Recent Labs  Lab 12/24/21 0419  DDIMER 2.19*     Lipid Panel     Component Value Date/Time   CHOL 99 12/24/2021 2008   TRIG 95 12/24/2021 2008   HDL 51 12/24/2021 2008   CHOLHDL 1.9 12/24/2021 2008   VLDL 19 12/24/2021 2008   [place] 29 12/24/2021 2008     Radiology    NM Pulmonary Perfusion  Result Date: 12/24/2021 CLINICAL DATA:  81 year old male with respiratory distress, shortness of breath. EXAM: NUCLEAR MEDICINE PERFUSION LUNG SCAN TECHNIQUE: Perfusion images were obtained in multiple projections after intravenous injection of radiopharmaceutical. Ventilation scans intentionally deferred if perfusion scan and chest x-ray adequate for interpretation during COVID 19 epidemic. RADIOPHARMACEUTICALS:  4.4 mCi Tc-13m MAA IV COMPARISON:  Noncontrast CT Chest, Abdomen, and Pelvis 0627 hours today. FINDINGS: In conjunction with the chest CT appearance earlier today the bilateral pulmonary perfusion radiotracer activity is fairly homogeneous and within normal limits. No convincing perfusion defect. IMPRESSION: No pulmonary perfusion abnormality, no evidence of pulmonary embolus. Electronically Signed   By: Genevie Ann M.D.   On: 12/24/2021 11:54   DG Chest Portable 1 View  Result Date: 12/24/2021 CLINICAL DATA:  81 year old male with shortness of breath, respiratory distress. Former smoker. EXAM: PORTABLE CHEST 1 VIEW COMPARISON:  Portable chest 09/13/2021 and earlier. FINDINGS: Portable AP semi upright view at 0432 hours. Stable lung volumes from last year. Cardiac silhouette appears mildly increased. Coronary artery stent is visible. Calcified aortic atherosclerosis. Other mediastinal contours are within normal limits. Visualized tracheal air column is within normal limits. Chronic bilateral  increased interstitial markings demonstrated last year, but superimposed acute basilar predominant increased pulmonary interstitial opacity and trace new pleural fluid in the right minor fissure. No pneumothorax, layering pleural effusion, or consolidation identified. No acute osseous abnormality identified. Partially visible gastric distension in the upper abdomen. IMPRESSION: 1. Acute on chronic pulmonary interstitial opacity with trace pleural fluid in the right minor fissure most compatible with acute pulmonary edema. 2. Mildly increased cardiomegaly from last year. 3. Gas distended stomach.  NG tube decompression might be valuable. Electronically Signed  By: Genevie Ann M.D.   On: 12/24/2021 04:47   CT CHEST ABDOMEN PELVIS WO CONTRAST  Result Date: 12/24/2021 CLINICAL DATA:  81 year old male with history of respiratory distress. Abdominal pain and abdominal distension. EXAM: CT CHEST, ABDOMEN AND PELVIS WITHOUT CONTRAST TECHNIQUE: Multidetector CT imaging of the chest, abdomen and pelvis was performed following the standard protocol without IV contrast. RADIATION DOSE REDUCTION: This exam was performed according to the departmental dose-optimization program which includes automated exposure control, adjustment of the mA and/or kV according to patient size and/or use of iterative reconstruction technique. COMPARISON:  No priors. FINDINGS: CT CHEST FINDINGS Cardiovascular: Heart size is normal. There is no significant pericardial fluid, thickening or pericardial calcification. There is aortic atherosclerosis, as well as atherosclerosis of the great vessels of the mediastinum and the coronary arteries, including calcified atherosclerotic plaque in the left main, left anterior descending, left circumflex and right coronary arteries. Mild calcifications of the aortic valve. Mediastinum/Nodes: Mediastinal or no pathologically enlarged hilar lymph nodes. Esophagus is unremarkable in appearance. No axillary  lymphadenopathy. Lungs/Pleura: Small bilateral pleural effusions lying dependently. Widespread areas of ground-glass attenuation and septal thickening noted in the lungs bilaterally, with areas of peribronchovascular airspace consolidation scattered throughout the lungs, most evident in the periphery of the right lower lobe and right middle lobe. Musculoskeletal: There are no aggressive appearing lytic or blastic lesions noted in the visualized portions of the skeleton. CT ABDOMEN PELVIS FINDINGS Hepatobiliary: Diffuse low attenuation throughout the hepatic parenchyma, indicative of a background of hepatic steatosis. Several small low-attenuation lesions are scattered throughout the liver, largest of which measures up to 1.3 cm in segment 4A, incompletely characterized on today's non-contrast CT examination, but statistically likely to represent small cysts. 6 mm calcified gallstone in the fundus of the gallbladder. Gallbladder is otherwise unremarkable in appearance. Pancreas: No definite pancreatic mass or peripancreatic fluid collections or inflammatory changes are noted on today's noncontrast CT examination. Spleen: Unremarkable. Adrenals/Urinary Tract: Low-attenuation lesions in both kidneys, incompletely characterized on today's non-contrast CT examination, but statistically likely to represent cysts, largest of which is exophytic extending off the lower pole of the right kidney measuring 7.5 cm in diameter. Mild right and moderate left renal atrophy. No hydroureteronephrosis. Urinary bladder is nearly completely decompressed with a Foley balloon catheter in place. Gas non dependently in the lumen of the urinary bladder is presumably iatrogenic. Bilateral adrenal glands are normal in appearance. Stomach/Bowel: The unenhanced appearance of the stomach is normal. There is no pathologic dilatation of small bowel or colon. Normal appendix. Vascular/Lymphatic: Aortic atherosclerosis. No lymphadenopathy noted in the  abdomen or pelvis. Reproductive: Prostate gland and seminal vesicles are unremarkable in appearance. Other: No significant volume of ascites.  No pneumoperitoneum. Musculoskeletal: There are no aggressive appearing lytic or blastic lesions noted in the visualized portions of the skeleton. IMPRESSION: 1. The appearance the chest is concerning for severe multilobar bilateral bronchopneumonia. 2. Small bilateral pleural effusions lying dependently. 3. No acute findings are noted in the abdomen or pelvis. 4. Aortic atherosclerosis, in addition to left main and three-vessel coronary artery disease. 5. Cholelithiasis without evidence of acute cholecystitis at this time. 6. Additional incidental findings, as above. Electronically Signed   By: Vinnie Langton M.D.   On: 12/24/2021 06:52    Cardiac Studies   TTE 09/14/21: EF estimated 40 to 45% with mildly decreased function.  Moderate LVH.  GRII DD-with mildly dilated left atrium.  Normal RV size and function.  Unable to assess RVP.  Normal RAP.  Normal aortic and mitral valve.    TTE 12/25/2021: EF~35% with moderately reduced function.  Global HK.  Mild LV dilation.  Mild LVH.  GRII DD.  Normal RV function, but unable to assess RVP.  Mild aortic valve calcification but no stenosis.  Patient Profile     81 y.o. 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 He was subsequently transferred to [facility] for evaluation for LHC.  => Unfortunate, his renal function has worsened overnight with diuresis.  Assessment & Plan    #Type I NSTEMI  Patient presented with acute onset chest pain, SOB, and diaphoresis and found to have  markedly elevated troponins >24,000.  CT chest performed in the ED also revealed significant multivessel CAD involving the left main coronary as well. Patient was transferred to [facility] for evaluation for LHC. Given patient's AKI (apparent BL ~2, sCr 3.37)  in the setting of contrast load for Cta chest and diuresis will hold on catheterization. In addition, patient remains asymptomatic.  -Continue heparin gtt -Continue nitroglycerin gtt -Continue Coreg 12.5mg  BID  -Continue to hold ACE/ARB given AKI  -Continue crestor 20mg  qd, LDL 29 -Hold on catheterization at this time given renal function  #Acute on Chronic HFmrEF Most recent EF 40-45%. He does not appear hypervolemic on exam this AM with no rales and no lower extremity edema. He does however remain on 6L of O2 to maintain his oxygen saturations at ~94%. Is and Os not well recorded. Will hold on further diuresis for now given bump in sCr to 3.37. -F/u Echo => severe reduction in EF now 35% with global HK. -Daily weights  -Strict I's and O's  -> With progression of renal dysfunction, will hold IV Lasix today and reassess tomorrow.  #New Onset Atrial Fibrillation Noted to go in and out of atrial fibrillation while in the ED with his atrial fibrillation captured on EKG. Telemetry reviewed this AM with patient mostly being in NSR. pAFib is likely secondary to his acute MI and hypoxia/hypercapnia. CHA2DS2-VASc score is 6. He is currently on heparin gtt for NSTEMI.  -Continue telemetry -Continue heparin -Rate control with Coreg => question if this is due to ACS versus the cause of CHF.  If there are significant amount of recurrences, would consider amiodarone for rhythm control. -TTE as above  #PVD  s/p R fem-bk-pop in 2019 -Continue ASA for now and crestor   #AKI on CKD stage 3 BL sCr ~2 now 3.37. Thought to be due to cardiorenal syndrome initially given pulmonary edema on exam. Patient was diuresed with worsening of his sCr this AM.  Possible component of contrast induced AKI.  Appears to be euvolemic on exam though requiring 6L of oxygen.  -Will hold lasix and evaluate kidney function in the AM.   #Acute Hypoxic and Hypercapneic Respiratory Failure #Acute Pulmonary Edema #Possible CAP  Likely 2/2 pulmonary edema. Briefly tolerated BiPAP now breathing without distress on 6L O2.  -continue to wean O2 as tolerated -Aggressive pulm toilet  -diuresis as above -On levaquin for possible PNA  #BPH -continue home flomax, has foley catheter in place. Will likely  discontinue in the AM.   Rick Duff, MD PGY-2 Internal Medicine  Pager [phone] 12/25/2021, 1:00 PM      ATTENDING ATTESTATION  I have seen, examined and evaluated the patient this AM on Rounds along with Dr. Eulas Post, (R2) .  After reviewing all the available data and chart, we discussed the patients laboratory, study & physical findings as well as symptoms in detail. I agree with his findings, examination as well as impression recommendations as per our discussion.    Attending adjustments noted in italics.   Mr. Doverspike presented with non-STEMI-large troponin elevation with intermittent atrial fibrillation and multifocal PVCs, now has a significant reduction in EF on echocardiogram.  Unfortunately, renal function has gotten progressively worse.  Most renal function stabilizes, we can then consider ischemic evaluation with cardiac catheterization.  I do think that we probably would end up doing a staged PCI if indicated.  Otherwise, on stable regimen.    Glenetta Hew, M.D., M.S. Interventional Cardiologist   Pager # [phone] Phone # [phone] [address] [place], [place] [address]

## 2021-12-25 NOTE — Plan of Care (Signed)

## 2021-12-25 NOTE — Progress Notes (Signed)
PROGRESS NOTE    John [name]  AOZ:[id number] DOB: [birth year] DOA: 12/24/2021 PCP: [name], DO    Brief Narrative:  AP transfer to [facility] for further evaluation by cardiology  John [name] is a 81 yo M with PMH/o DMII, OSA, dCHF, BPH, CAD, HTN, HLD CVA, PVD presented with shortness of breath.      acute onset difficulty breathing for the past 2 to 3 hours.  He took his home albuterol without relief.  No home oxygen use.   EMS arrival patient was hypoxic, placed on 15 L nonrebreather with improvement to pulse ox into the low 90s.  90-92.  Patient reports cough, unclear what color the sputum is but does feel it has been productive.  No longer smoking.  Patient denies chest pain, fevers or chills, no abdominal pain, no rashes.  No recent change in medications or diet. ED:  On arrival SBP> 200 Blood pressure (!) 156/85, pulse 87, temperature 97.6 F (36.4 C), temperature source Axillary, resp. rate (!) 23, height 5\' 6"  (1.676 m), weight 89.2 kg, SpO2 97 %.   ABG : PH 7.16, PCO2 76, bicarb 28.4 BNP 480, BUN 38, creatinine 2.52, WBC 13.3, lactic acid 2.1, 1.0, D-dimer 2.19, troponin 39 - 313   EKG: A-fib with a rate of 97 PVCs Chest x-ray: Pleural fluid, pulmonary edema, cardiomegaly,  3/6 feeling a little better.  Less short of breath.  On nitro drip.    Consultants:  Cardiology  Procedures:   Antimicrobials:      Subjective: No chest pain or dizziness  Objective: Vitals:   12/25/21 0323 12/25/21 0521 12/25/21 0700 12/25/21 0800  BP: 122/67  113/66 (!) 121/56  Pulse: 76 84 74 79  Resp: 13 10 14 16   Temp: 98.8 F (37.1 C)  98.4 F (36.9 C)   TempSrc: Oral  Oral   SpO2: 97% 95% 96% 99%  Weight:  91.8 kg    Height:        Intake/Output Summary (Last 24 hours) at 12/25/2021 0849 Last data filed at 12/25/2021 9629 Gross per 24 hour  Intake 254.73 ml  Output 650 ml  Net -395.27 ml   Filed Weights   12/24/21 0429 12/25/21 0521  Weight: 89.2 kg 91.8 kg     Examination:  Calm, NAD Minimal scattered bibasilar rales, decrease bs Reg s1/s2 no gallop Soft benign +bs No edema Aaoxox3  Mood and affect appropriate in current setting    Data Reviewed: I have personally reviewed following labs and imaging studies  CBC: Recent Labs  Lab 12/24/21 0419 12/25/21 0122  WBC 13.3* 11.9*  HGB 14.9 12.4*  HCT 48.2 38.4*  MCV 94.9 91.4  PLT 179 528*   Basic Metabolic Panel: Recent Labs  Lab 12/24/21 0419 12/24/21 2008 12/25/21 0122  NA 137  --  138  K 4.2  --  4.3  CL 101  --  103  CO2 26  --  23  GLUCOSE 263*  --  75  BUN 38*  --  51*  CREATININE 2.52*  --  3.37*  CALCIUM 9.0  --  8.6*  MG  --  2.2  --    GFR: Estimated Creatinine Clearance: 17.9 mL/min (A) (by C-G formula based on SCr of 3.37 mg/dL (H)). Liver Function Tests: Recent Labs  Lab 12/24/21 0419  AST 32  ALT 25  ALKPHOS 74  BILITOT 0.9  PROT 8.4*  ALBUMIN 4.2   No results for input(s): LIPASE, AMYLASE in the last 168  hours. No results for input(s): AMMONIA in the last 168 hours. Coagulation Profile: Recent Labs  Lab 12/25/21 0122  INR 1.1   Cardiac Enzymes: No results for input(s): CKTOTAL, CKMB, CKMBINDEX, TROPONINI in the last 168 hours. BNP (last 3 results) No results for input(s): PROBNP in the last 8760 hours. HbA1C: Recent Labs    12/24/21 0746  HGBA1C 7.1*   CBG: Recent Labs  Lab 12/24/21 0815 12/24/21 1312 12/24/21 1908 12/24/21 2102 12/25/21 0612  GLUCAP 254* 197* 112* 120* 156*   Lipid Profile: Recent Labs    12/24/21 2008  CHOL 99  HDL 51  LDLCALC 29  TRIG 95  CHOLHDL 1.9   Thyroid Function Tests: Recent Labs    12/24/21 2008  TSH 2.221   Anemia Panel: No results for input(s): VITAMINB12, FOLATE, FERRITIN, TIBC, IRON, RETICCTPCT in the last 72 hours. Sepsis Labs: Recent Labs  Lab 12/24/21 0419 12/24/21 0635  LATICACIDVEN 2.1* 1.0    Recent Results (from the past 240 hour(s))  Resp Panel by RT-PCR (Flu  A&B, Covid) Nasopharyngeal Swab     Status: None   Collection Time: 12/24/21  4:27 AM   Specimen: Nasopharyngeal Swab; Nasopharyngeal(NP) swabs in vial transport medium  Result Value Ref Range Status   SARS Coronavirus 2 by RT PCR NEGATIVE NEGATIVE Final    Comment: (NOTE) SARS-CoV-2 target nucleic acids are NOT DETECTED.  The SARS-CoV-2 RNA is generally detectable in upper respiratory specimens during the acute phase of infection. The lowest concentration of SARS-CoV-2 viral copies this assay can detect is 138 copies/mL. A negative result does not preclude SARS-Cov-2 infection and should not be used as the sole basis for treatment or other patient management decisions. A negative result may occur with  improper specimen collection/handling, submission of specimen other than nasopharyngeal swab, presence of viral mutation(s) within the areas targeted by this assay, and inadequate number of viral copies(<138 copies/mL). A negative result must be combined with clinical observations, patient history, and epidemiological information. The expected result is Negative.  Fact Sheet for Patients:  EntrepreneurPulse.com.au  Fact Sheet for Healthcare Providers:  IncredibleEmployment.be  This test is no t yet approved or cleared by the [place] FDA and  has been authorized for detection and/or diagnosis of SARS-CoV-2 by FDA under an Emergency Use Authorization (EUA). This EUA will remain  in effect (meaning this test can be used) for the duration of the COVID-19 declaration under Section 564(b)(1) of the Act, 21 U.S.C.section 360bbb-3(b)(1), unless the authorization is terminated  or revoked sooner.       Influenza A by PCR NEGATIVE NEGATIVE Final   Influenza B by PCR NEGATIVE NEGATIVE Final    Comment: (NOTE) The Xpert Xpress SARS-CoV-2/FLU/RSV plus assay is intended as an aid in the diagnosis of influenza from Nasopharyngeal swab specimens  and should not be used as a sole basis for treatment. Nasal washings and aspirates are unacceptable for Xpert Xpress SARS-CoV-2/FLU/RSV testing.  Fact Sheet for Patients: EntrepreneurPulse.com.au  Fact Sheet for Healthcare Providers: IncredibleEmployment.be  This test is not yet approved or cleared by the [place] FDA and has been authorized for detection and/or diagnosis of SARS-CoV-2 by FDA under an Emergency Use Authorization (EUA). This EUA will remain in effect (meaning this test can be used) for the duration of the COVID-19 declaration under Section 564(b)(1) of the Act, 21 U.S.C. section 360bbb-3(b)(1), unless the authorization is terminated or revoked.  Performed at [facility], [address], [place], [place] [address]   Culture, blood (  routine x 2)     Status: None (Preliminary result)   Collection Time: 12/24/21  8:05 AM   Specimen: Left Antecubital; Blood  Result Value Ref Range Status   Specimen Description   Final    LEFT ANTECUBITAL BOTTLES DRAWN AEROBIC AND ANAEROBIC   Special Requests   Final    Blood Culture adequate volume Performed at [facility], [address], [place], [place] [address]    Culture PENDING  Incomplete   Report Status PENDING  Incomplete  Culture, blood (routine x 2)     Status: None (Preliminary result)   Collection Time: 12/24/21  8:07 AM   Specimen: Left Antecubital; Blood  Result Value Ref Range Status   Specimen Description   Final    BLOOD RIGHT HAND BOTTLES DRAWN AEROBIC AND ANAEROBIC   Special Requests   Final    Blood Culture adequate volume Performed at [facility], [address], [place], [place] [address]    Culture PENDING  Incomplete   Report Status PENDING  Incomplete         Radiology Studies: NM Pulmonary Perfusion  Result Date: 12/24/2021 CLINICAL DATA:  82 year old male with respiratory distress, shortness of breath. EXAM: NUCLEAR MEDICINE PERFUSION LUNG SCAN  TECHNIQUE: Perfusion images were obtained in multiple projections after intravenous injection of radiopharmaceutical. Ventilation scans intentionally deferred if perfusion scan and chest x-ray adequate for interpretation during COVID 19 epidemic. RADIOPHARMACEUTICALS:  4.4 mCi Tc-8m MAA IV COMPARISON:  Noncontrast CT Chest, Abdomen, and Pelvis 0627 hours today. FINDINGS: In conjunction with the chest CT appearance earlier today the bilateral pulmonary perfusion radiotracer activity is fairly homogeneous and within normal limits. No convincing perfusion defect. IMPRESSION: No pulmonary perfusion abnormality, no evidence of pulmonary embolus. Electronically Signed   By: Genevie Ann M.D.   On: 12/24/2021 11:54   DG Chest Portable 1 View  Result Date: 12/24/2021 CLINICAL DATA:  81 year old male with shortness of breath, respiratory distress. Former smoker. EXAM: PORTABLE CHEST 1 VIEW COMPARISON:  Portable chest 09/13/2021 and earlier. FINDINGS: Portable AP semi upright view at 0432 hours. Stable lung volumes from last year. Cardiac silhouette appears mildly increased. Coronary artery stent is visible. Calcified aortic atherosclerosis. Other mediastinal contours are within normal limits. Visualized tracheal air column is within normal limits. Chronic bilateral increased interstitial markings demonstrated last year, but superimposed acute basilar predominant increased pulmonary interstitial opacity and trace new pleural fluid in the right minor fissure. No pneumothorax, layering pleural effusion, or consolidation identified. No acute osseous abnormality identified. Partially visible gastric distension in the upper abdomen. IMPRESSION: 1. Acute on chronic pulmonary interstitial opacity with trace pleural fluid in the right minor fissure most compatible with acute pulmonary edema. 2. Mildly increased cardiomegaly from last year. 3. Gas distended stomach.  NG tube decompression might be valuable. Electronically Signed   By:  Genevie Ann M.D.   On: 12/24/2021 04:47   CT CHEST ABDOMEN PELVIS WO CONTRAST  Result Date: 12/24/2021 CLINICAL DATA:  81 year old male with history of respiratory distress. Abdominal pain and abdominal distension. EXAM: CT CHEST, ABDOMEN AND PELVIS WITHOUT CONTRAST TECHNIQUE: Multidetector CT imaging of the chest, abdomen and pelvis was performed following the standard protocol without IV contrast. RADIATION DOSE REDUCTION: This exam was performed according to the departmental dose-optimization program which includes automated exposure control, adjustment of the mA and/or kV according to patient size and/or use of iterative reconstruction technique. COMPARISON:  No priors. FINDINGS: CT CHEST FINDINGS Cardiovascular: Heart size is normal. There is no significant  pericardial fluid, thickening or pericardial calcification. There is aortic atherosclerosis, as well as atherosclerosis of the great vessels of the mediastinum and the coronary arteries, including calcified atherosclerotic plaque in the left main, left anterior descending, left circumflex and right coronary arteries. Mild calcifications of the aortic valve. Mediastinum/Nodes: Mediastinal or no pathologically enlarged hilar lymph nodes. Esophagus is unremarkable in appearance. No axillary lymphadenopathy. Lungs/Pleura: Small bilateral pleural effusions lying dependently. Widespread areas of ground-glass attenuation and septal thickening noted in the lungs bilaterally, with areas of peribronchovascular airspace consolidation scattered throughout the lungs, most evident in the periphery of the right lower lobe and right middle lobe. Musculoskeletal: There are no aggressive appearing lytic or blastic lesions noted in the visualized portions of the skeleton. CT ABDOMEN PELVIS FINDINGS Hepatobiliary: Diffuse low attenuation throughout the hepatic parenchyma, indicative of a background of hepatic steatosis. Several small low-attenuation lesions are scattered  throughout the liver, largest of which measures up to 1.3 cm in segment 4A, incompletely characterized on today's non-contrast CT examination, but statistically likely to represent small cysts. 6 mm calcified gallstone in the fundus of the gallbladder. Gallbladder is otherwise unremarkable in appearance. Pancreas: No definite pancreatic mass or peripancreatic fluid collections or inflammatory changes are noted on today's noncontrast CT examination. Spleen: Unremarkable. Adrenals/Urinary Tract: Low-attenuation lesions in both kidneys, incompletely characterized on today's non-contrast CT examination, but statistically likely to represent cysts, largest of which is exophytic extending off the lower pole of the right kidney measuring 7.5 cm in diameter. Mild right and moderate left renal atrophy. No hydroureteronephrosis. Urinary bladder is nearly completely decompressed with a Foley balloon catheter in place. Gas non dependently in the lumen of the urinary bladder is presumably iatrogenic. Bilateral adrenal glands are normal in appearance. Stomach/Bowel: The unenhanced appearance of the stomach is normal. There is no pathologic dilatation of small bowel or colon. Normal appendix. Vascular/Lymphatic: Aortic atherosclerosis. No lymphadenopathy noted in the abdomen or pelvis. Reproductive: Prostate gland and seminal vesicles are unremarkable in appearance. Other: No significant volume of ascites.  No pneumoperitoneum. Musculoskeletal: There are no aggressive appearing lytic or blastic lesions noted in the visualized portions of the skeleton. IMPRESSION: 1. The appearance the chest is concerning for severe multilobar bilateral bronchopneumonia. 2. Small bilateral pleural effusions lying dependently. 3. No acute findings are noted in the abdomen or pelvis. 4. Aortic atherosclerosis, in addition to left main and three-vessel coronary artery disease. 5. Cholelithiasis without evidence of acute cholecystitis at this time. 6.  Additional incidental findings, as above. Electronically Signed   By: Vinnie Langton M.D.   On: 12/24/2021 06:52        Scheduled Meds:  allopurinol  300 mg Oral Daily   aspirin  81 mg Oral Daily   carvedilol  12.5 mg Oral BID WC   cholecalciferol  1,000 Units Oral Daily   furosemide  40 mg Intravenous Q12H   gabapentin  300 mg Oral BID   insulin aspart  0-20 Units Subcutaneous TID WC   insulin glargine-yfgn  43 Units Subcutaneous Daily   methylPREDNISolone (SOLU-MEDROL) injection  80 mg Intravenous Q12H   pantoprazole  40 mg Oral Daily   rosuvastatin  20 mg Oral QPM   sertraline  25 mg Oral QPM   sodium chloride flush  3 mL Intravenous Q12H   sodium chloride flush  3 mL Intravenous Q12H   tamsulosin  0.4 mg Oral QPM   Continuous Infusions:  sodium chloride Stopped (12/24/21 1700)   heparin 1,300 Units/hr (12/25/21 0321)  levofloxacin (LEVAQUIN) IV     nitroGLYCERIN 5 mcg/min (12/25/21 4098)    Assessment & Plan:   Principal Problem:   NSTEMI (non-ST elevated myocardial infarction) ([place]) Active Problems:   Acute exacerbation of CHF (congestive heart failure) (HCC)   Acute respiratory failure with hypoxia and hypercapnia (HCC)   Hypoxia   Essential hypertension, malignant   D-dimer, elevated   CAD (coronary artery disease) of artery bypass graft   Cardiomyopathy, ischemic   Mixed hyperlipidemia   Peripheral vascular disease (HCC)   Coronary atherosclerosis of native coronary artery   Elevated troponin   Obstructive sleep apnea   BPH (benign prostatic hyperplasia)   CKD (chronic kidney disease), stage III (HCC)   Diabetic neuropathy (HCC)   NSTEMI (non-ST elevated myocardial infarction) ([place]) Elevated troponin Currently chest pain-free Cardiology was consulted Status post aspirin load, continue aspirin 81 mg daily Continue heparin drip, statin, beta-blockers No ACE or ARB given AKI Continue nitrate drip titrated to chest pain Tentatively plan for LHC today  pending reevaluation of his creatinine Currently n.p.o. Check echocardiogram, fasting lipid panel Continue telemetry       Acute respiratory failure with hypoxia and hypercapnia (HCC) - Multifactorial likely diastolic CHF exacerbation, volume overload, Pna 3/5 continue diuresis Initially was placed on BiPAP could not tolerate due to vomiting then placed on NRB. Weaning O2 as tolerated to keep O2 sats above 92% Continue steroids, inhalers Continue IV antibiotics Was receiving Lasix x2, on hold now  Acute on chronic combined systolic and diastolic heart failure  (HCC) - Elevated BNP, chest x-ray consistent with congestive 3/6 check repeat echo status post Lasix 40 mg IV x2 Repeat echo, I's and O's and daily   Essential hypertension, malignant - History of underlying hypertension currently accelerated hypertension  -On arrival SBP > 200 3/6 on nitro gtt    Alcohol abuse Patient reported having multiple drinks of wine and shot of wiskey several days of the week for many years. As cautionary , will place on CIWA protocal He was counseled about alcohol cessation   D-dimer, elevated - Likely acute reactant -Due to CKD, obtaining V/Q nuclear scan -Shortness with hypoxia most likely due to CHF exacerbation, congestion, pneumonia, -We will monitor closely      Diabetic neuropathy (HCC) - Stable currently not on any medication -Strict glycemic control   CKD (chronic kidney disease), stage III (HCC) - Baseline creatinine around 2 -Mildly elevated now -Kidney function closely -Fortunately expecting creatinine function to worsen due to aggressive diuretics Lasix 3/6 if renal function worsens will consult nephrology   BPH (benign prostatic hyperplasia) - Monitoring, PSA -Continue Flomax   Obstructive sleep apnea - Noncompliant with CPAP  -Not on supplemental O2 at home -Courage patient to be compliant with his home CPAP -Could not tolerate BiPAP currently on high flow  O2 tolerating    CAD H/xo cabg Ischemic Cm - Continue home medication    Peripheral vascular disease ([place]) - Continue aspirin, continue statins   Mixed hyperlipidemia - Continue Crestor           DVT prophylaxis: Heparin Code Status: Full Family Communication: None at bedside Disposition Plan:  Status is: Inpatient Remains inpatient appropriate because: IV treatment.  Plan for cardiac cath.        LOS: 1 day   Time spent: 50 min with >50% on coc    Nolberto Hanlon, MD [facility] Pager [phone]  If 7PM-7AM, please contact night-coverage 12/25/2021, 8:49 AM

## 2021-12-25 NOTE — Progress Notes (Signed)
?  12/25/21 1057  ?Clinical Encounter Type  ?Visited With Health care provider;Patient and family together  ?Visit Type Spiritual support;Initial  ?Referral From Nurse  ?Consult/Referral To Chaplain  ?Spiritual Encounters  ?Spiritual Needs Prayer;Sacred text  ? ?Met with Mr. Kunal Levario, his son, and granddaughter at patient's bedside. Patient's son stated that his father was questioning his salvation. Mr. Stroebel explained that he has not maintained close relationship with Jesus Christ during his adult years. Mr. Adkison freely professed his belief in [place] as his [place]. Mr. Aspinall also told me that he had baptized as an adult. I assured Kharee of Christ's promise of eternal life to whoever believes and Baptized. I advised Mr. Heatherly that he cannot change his past relationship, but that he can go forth and build his relationship with Christ through study and prayer for the rest of his life. I presented Mr. Bernards with a Bible and high-lighter so that he may begin his study of Scripture. Patient invited me to visit him again. [employer], [phone].  ?

## 2021-12-25 NOTE — Progress Notes (Signed)
ANTICOAGULATION CONSULT NOTE - Follow Up Consult ? ?Pharmacy Consult for heparin ?Indication:  NSTEMI ? ?Labs: ?Recent Labs  ?  12/24/21 ?0419 12/24/21 ?0635 12/24/21 ?1433  ?HGB 14.9  --   --   ?HCT 48.2  --   --   ?PLT 179  --   --   ?CREATININE 2.52*  --   --   ?TROPONINIHS 39* 313* >24,000*  ? ? ?Assessment: ?81yo male subtherapeutic on heparin with initial dosing for NSTEMI (note: heparin level <0.1 but did not cross into Epic); no infusion issues or signs of bleeding per RN. ? ?Goal of Therapy:  ?Heparin level 0.3-0.7 units/ml ?  ?Plan:  ?Will rebolus with heparin 2000 units and increase heparin infusion by 4 units/kg/hr to 1300 units/hr and check level in 8 hours.   ? ?Wynona Neat, PharmD, BCPS  ?12/25/2021,3:17 AM ? ? ?

## 2021-12-26 ENCOUNTER — Inpatient Hospital Stay (HOSPITAL_COMMUNITY): Payer: Medicare Other

## 2021-12-26 DIAGNOSIS — I214 Non-ST elevation (NSTEMI) myocardial infarction: Secondary | ICD-10-CM | POA: Diagnosis not present

## 2021-12-26 LAB — URINALYSIS, COMPLETE (UACMP) WITH MICROSCOPIC
Bilirubin Urine: NEGATIVE
Glucose, UA: >=500 mg/dL — AB
Ketones, ur: NEGATIVE mg/dL
Nitrite: NEGATIVE
Protein, ur: 30 mg/dL — AB
Specific Gravity, Urine: 1.015 (ref 1.005–1.030)
pH: 5 (ref 5.0–8.0)

## 2021-12-26 LAB — CBC
HCT: 36.6 % — ABNORMAL LOW (ref 39.0–52.0)
Hemoglobin: 11.7 g/dL — ABNORMAL LOW (ref 13.0–17.0)
MCH: 29 pg (ref 26.0–34.0)
MCHC: 32 g/dL (ref 30.0–36.0)
MCV: 90.8 fL (ref 80.0–100.0)
Platelets: 133 10*3/uL — ABNORMAL LOW (ref 150–400)
RBC: 4.03 MIL/uL — ABNORMAL LOW (ref 4.22–5.81)
RDW: 15.6 % — ABNORMAL HIGH (ref 11.5–15.5)
WBC: 10.5 10*3/uL (ref 4.0–10.5)
nRBC: 0.2 % (ref 0.0–0.2)

## 2021-12-26 LAB — GLUCOSE, CAPILLARY
Glucose-Capillary: 240 mg/dL — ABNORMAL HIGH (ref 70–99)
Glucose-Capillary: 265 mg/dL — ABNORMAL HIGH (ref 70–99)
Glucose-Capillary: 285 mg/dL — ABNORMAL HIGH (ref 70–99)
Glucose-Capillary: 247 mg/dL — ABNORMAL HIGH (ref 70–99)

## 2021-12-26 LAB — HEPARIN LEVEL (UNFRACTIONATED)
Heparin Unfractionated: 0.85 IU/mL — ABNORMAL HIGH (ref 0.30–0.70)
Heparin Unfractionated: 0.66 IU/mL (ref 0.30–0.70)
Heparin Unfractionated: 0.75 IU/mL — ABNORMAL HIGH (ref 0.30–0.70)

## 2021-12-26 LAB — RENAL FUNCTION PANEL
Albumin: 3.3 g/dL — ABNORMAL LOW (ref 3.5–5.0)
Anion gap: 15 (ref 5–15)
BUN: 77 mg/dL — ABNORMAL HIGH (ref 8–23)
CO2: 23 mmol/L (ref 22–32)
Calcium: 9 mg/dL (ref 8.9–10.3)
Chloride: 97 mmol/L — ABNORMAL LOW (ref 98–111)
Creatinine, Ser: 3.55 mg/dL — ABNORMAL HIGH (ref 0.61–1.24)
GFR, Estimated: 16 mL/min — ABNORMAL LOW (ref >60)
Glucose, Bld: 234 mg/dL — ABNORMAL HIGH (ref 70–99)
Phosphorus: 6.2 mg/dL — ABNORMAL HIGH (ref 2.5–4.6)
Potassium: 4.9 mmol/L (ref 3.5–5.1)
Sodium: 135 mmol/L (ref 135–145)

## 2021-12-26 LAB — MAGNESIUM: Magnesium: 2.3 mg/dL (ref 1.7–2.4)

## 2021-12-26 LAB — BRAIN NATRIURETIC PEPTIDE: B Natriuretic Peptide: 885.5 pg/mL — ABNORMAL HIGH (ref 0.0–100.0)

## 2021-12-26 MED ORDER — BISACODYL 10 MG RE SUPP
10.0000 mg | Freq: Every day | RECTAL | Status: DC | PRN
  Administered 2021-12-29: 10 mg via RECTAL
  Filled 2021-12-26: qty 1

## 2021-12-26 MED ORDER — ALLOPURINOL 100 MG PO TABS
200.0000 mg | ORAL_TABLET | Freq: Every day | ORAL | Status: DC
Start: 2021-12-27 — End: 2021-12-27
  Administered 2021-12-27: 200 mg via ORAL
  Filled 2021-12-26: qty 2

## 2021-12-26 MED ORDER — PREDNISONE 10 MG PO TABS
50.0000 mg | ORAL_TABLET | Freq: Every day | ORAL | Status: DC
  Administered 2021-12-27 – 2021-12-29 (×3): 50 mg via ORAL
  Filled 2021-12-26 (×3): qty 1

## 2021-12-26 NOTE — Progress Notes (Signed)
Inpatient Diabetes Program Recommendations ? ?AACE/ADA: New Consensus Statement on Inpatient Glycemic Control (2015) ? ?Target Ranges:  Prepandial:   less than 140 mg/dL ?     Peak postprandial:   less than 180 mg/dL (1-2 hours) ?     Critically ill patients:  140 - 180 mg/dL  ? ?Lab Results  ?Component Value Date  ? GLUCAP 240 (H) 12/26/2021  ? HGBA1C 7.1 (H) 12/24/2021  ? ? ?Review of Glycemic Control ? Latest Reference Range & Units 12/24/21 19:08 12/24/21 21:02 12/25/21 06:12 12/25/21 12:05 12/25/21 15:58 12/25/21 21:25 12/26/21 06:14  ?Glucose-Capillary 70 - 99 mg/dL 112 (H) 120 (H) 156 (H) 201 (H) 171 (H) 233 (H) 240 (H)  ? ?Diabetes history: DM 2 ?Outpatient Diabetes medications:  ?Lantus 43 units daily ?Amaryl 4 mg daily ?Current orders for Inpatient glycemic control:  ?Novolog 0-20 units tid with meals ?Semglee 43 units daily ?Solumedrol 80 mg IV bid ? ?Inpatient Diabetes Program Recommendations:   ? ?Consider adding Novolog bedtime scale (0-5 units).  ? ?Thanks,  ?Adah Perl, RN, BC-ADM ?Inpatient Diabetes Coordinator ?Pager [phone]  (8a-5p) ? ? ?

## 2021-12-26 NOTE — Progress Notes (Signed)
ANTICOAGULATION CONSULT NOTE ? ?Pharmacy Consult for IV heparin ?Indication: chest pain/ACS ? ?Allergies  ?Allergen Reactions  ? Brilinta [Ticagrelor] Shortness Of Breath  ? Penicillins Hives  ? Ezetimibe-Simvastatin Other (See Comments)  ?  Myalgia ?  ? ? ?Patient Measurements: ?Height: 5\' 6"  (167.6 cm) ?Weight: 91.8 kg (202 lb 6.1 oz) ?IBW/kg (Calculated) : 63.8 ?Heparin Dosing Weight: 82.6 kg ? ?Vital Signs: ?Temp: 97.6 ?F (36.4 ?C) (03/07 1959) ?Temp Source: Oral (03/07 1959) ?BP: 129/73 (03/07 1959) ?Pulse Rate: 76 (03/07 1959) ? ?Labs: ?Recent Labs  ?  12/24/21 ?0419 12/24/21 ?0419 12/24/21 ?0635 12/24/21 ?1433 12/25/21 ?0122 12/25/21 ?1052 12/25/21 ?2001 12/26/21 ?0559 12/26/21 ?1433 12/26/21 ?2051  ?HGB 14.9  --   --   --  12.4*  --   --  11.7*  --   --   ?HCT 48.2  --   --   --  38.4*  --   --  36.6*  --   --   ?PLT 179  --   --   --  133*  --   --  133*  --   --   ?LABPROT  --   --   --   --  14.2  --   --   --   --   --   ?INR  --   --   --   --  1.1  --   --   --   --   --   ?HEPARINUNFRC  --    < >  --   --  <0.10* 0.26*   < > 0.85* 0.66 0.75*  ?CREATININE 2.52*  --   --   --  3.37* 3.54*  --  3.55*  --   --   ?TROPONINIHS 39*  --  313* >24,000*  --   --   --   --   --   --   ? < > = values in this interval not displayed.  ? ? ? ?Estimated Creatinine Clearance: 17 mL/min (A) (by C-G formula based on SCr of 3.55 mg/dL (H)). ? ? ?Assessment: ?60 YOM presenting with chest pain, found to have an NSTEMI and started on IV heparin. Plan is for cath if renal function stabilizes, pharmacy to dose heparin.  ? ?Heparin level 0.86 and supratherapeutic after IV heparin gtt increase to 1700 units/h. Hgb and platelets remain stable this morning. No signs of bleeding or IV site issues per RN.  ? ?HL came back supratherapeutic tonight at 0.75. We will reduce rate and check in AM.  ?Goal of Therapy:  ?Heparin level 0.3-0.7 units/ml ?Monitor platelets by anticoagulation protocol: Yes ?  ?Plan:  ?Decrease IV heparin gtt to  1450 units/h ?AM heparin level ?Daily heparin level, CBC ?Monitor for signs/symptoms of bleeding ?F/u plans for oral [facility] after cath ? ?Onnie Boer, PharmD, BCIDP, AAHIVP, CPP ?Infectious Disease Pharmacist ?12/26/2021 9:46 PM ? ? ? ? ? ?

## 2021-12-26 NOTE — Progress Notes (Signed)
PROGRESS NOTE    John [name]  QBH:[id number] DOB: 05-Aug-1941 DOA: 12/24/2021 PCP: [name], DO    Brief Narrative:  AP transfer to [facility] for further evaluation by cardiology  John [name] is a 81 yo M with PMH/o DMII, OSA, dCHF, BPH, CAD, HTN, HLD CVA, PVD presented with shortness of breath.      acute onset difficulty breathing for the past 2 to 3 hours.  He took his home albuterol without relief.  No home oxygen use.   EMS arrival patient was hypoxic, placed on 15 L nonrebreather with improvement to pulse ox into the low 90s.  90-92.  Patient reports cough, unclear what color the sputum is but does feel it has been productive.  No longer smoking.  Patient denies chest pain, fevers or chills, no abdominal pain, no rashes.  No recent change in medications or diet. ED:  On arrival SBP> 200 Blood pressure (!) 156/85, pulse 87, temperature 97.6 F (36.4 C), temperature source Axillary, resp. rate (!) 23, height 5\' 6"  (1.676 m), weight 89.2 kg, SpO2 97 %.   ABG : PH 7.16, PCO2 76, bicarb 28.4 BNP 480, BUN 38, creatinine 2.52, WBC 13.3, lactic acid 2.1, 1.0, D-dimer 2.19, troponin 39 - 313   EKG: A-fib with a rate of 97 PVCs Chest x-ray: Pleural fluid, pulmonary edema, cardiomegaly,  Admitted for NSTEMI.  3/7 on heparin and nitro gtt.   Consultants:  Cardiology  Procedures:   Antimicrobials:      Subjective: Less short of breath.  No chest pain.  Has no new complaints  Objective: Vitals:   12/25/21 2350 12/26/21 0005 12/26/21 0256 12/26/21 0432  BP:  121/62  (!) 147/76  Pulse: 78 75 68 89  Resp: 17 17 15 15   Temp:    98 F (36.7 C)  TempSrc:    Oral  SpO2: 98% 96% 96% 100%  Weight:    91.8 kg  Height:        Intake/Output Summary (Last 24 hours) at 12/26/2021 0816 Last data filed at 12/26/2021 0973 Gross per 24 hour  Intake 435.28 ml  Output 1700 ml  Net -1264.72 ml   Filed Weights   12/24/21 0429 12/25/21 0521 12/26/21 0432  Weight: 89.2 kg 91.8  kg 91.8 kg    Examination:  Calm, NAD Scattered fine Rales at the bases Reg-irrg s1/s2 no gallop Soft benign +bs No edema Aaoxox3  Mood and affect appropriate in current setting    Data Reviewed: I have personally reviewed following labs and imaging studies  CBC: Recent Labs  Lab 12/24/21 0419 12/25/21 0122 12/26/21 0559  WBC 13.3* 11.9* 10.5  HGB 14.9 12.4* 11.7*  HCT 48.2 38.4* 36.6*  MCV 94.9 91.4 90.8  PLT 179 133* 532*   Basic Metabolic Panel: Recent Labs  Lab 12/24/21 0419 12/24/21 2008 12/25/21 0122 12/25/21 1052 12/26/21 0559 12/26/21 0619  NA 137  --  138 135 135  --   K 4.2  --  4.3 5.0 4.9  --   CL 101  --  103 99 97*  --   CO2 26  --  23 23 23   --   GLUCOSE 263*  --  75 196* 234*  --   BUN 38*  --  51* 57* 77*  --   CREATININE 2.52*  --  3.37* 3.54* 3.55*  --   CALCIUM 9.0  --  8.6* 8.7* 9.0  --   MG  --  2.2  --  2.2  --  2.3  PHOS  --   --   --   --  6.2*  --    GFR: Estimated Creatinine Clearance: 17 mL/min (A) (by C-G formula based on SCr of 3.55 mg/dL (H)). Liver Function Tests: Recent Labs  Lab 12/24/21 0419 12/26/21 0559  AST 32  --   ALT 25  --   ALKPHOS 74  --   BILITOT 0.9  --   PROT 8.4*  --   ALBUMIN 4.2 3.3*   No results for input(s): LIPASE, AMYLASE in the last 168 hours. No results for input(s): AMMONIA in the last 168 hours. Coagulation Profile: Recent Labs  Lab 12/25/21 0122  INR 1.1   Cardiac Enzymes: No results for input(s): CKTOTAL, CKMB, CKMBINDEX, TROPONINI in the last 168 hours. BNP (last 3 results) No results for input(s): PROBNP in the last 8760 hours. HbA1C: Recent Labs    12/24/21 0746  HGBA1C 7.1*   CBG: Recent Labs  Lab 12/25/21 0612 12/25/21 1205 12/25/21 1558 12/25/21 2125 12/26/21 0614  GLUCAP 156* 201* 171* 233* 240*   Lipid Profile: Recent Labs    12/24/21 2008  CHOL 99  HDL 51  LDLCALC 29  TRIG 95  CHOLHDL 1.9   Thyroid Function Tests: Recent Labs    12/24/21 2008   TSH 2.221   Anemia Panel: No results for input(s): VITAMINB12, FOLATE, FERRITIN, TIBC, IRON, RETICCTPCT in the last 72 hours. Sepsis Labs: Recent Labs  Lab 12/24/21 0419 12/24/21 0635  LATICACIDVEN 2.1* 1.0    Recent Results (from the past 240 hour(s))  Resp Panel by RT-PCR (Flu A&B, Covid) Nasopharyngeal Swab     Status: None   Collection Time: 12/24/21  4:27 AM   Specimen: Nasopharyngeal Swab; Nasopharyngeal(NP) swabs in vial transport medium  Result Value Ref Range Status   SARS Coronavirus 2 by RT PCR NEGATIVE NEGATIVE Final    Comment: (NOTE) SARS-CoV-2 target nucleic acids are NOT DETECTED.  The SARS-CoV-2 RNA is generally detectable in upper respiratory specimens during the acute phase of infection. The lowest concentration of SARS-CoV-2 viral copies this assay can detect is 138 copies/mL. A negative result does not preclude SARS-Cov-2 infection and should not be used as the sole basis for treatment or other patient management decisions. A negative result may occur with  improper specimen collection/handling, submission of specimen other than nasopharyngeal swab, presence of viral mutation(s) within the areas targeted by this assay, and inadequate number of viral copies(<138 copies/mL). A negative result must be combined with clinical observations, patient history, and epidemiological information. The expected result is Negative.  Fact Sheet for Patients:  EntrepreneurPulse.com.au  Fact Sheet for Healthcare Providers:  IncredibleEmployment.be  This test is no t yet approved or cleared by the [place] FDA and  has been authorized for detection and/or diagnosis of SARS-CoV-2 by FDA under an Emergency Use Authorization (EUA). This EUA will remain  in effect (meaning this test can be used) for the duration of the COVID-19 declaration under Section 564(b)(1) of the Act, 21 U.S.C.section 360bbb-3(b)(1), unless the authorization  is terminated  or revoked sooner.       Influenza A by PCR NEGATIVE NEGATIVE Final   Influenza B by PCR NEGATIVE NEGATIVE Final    Comment: (NOTE) The Xpert Xpress SARS-CoV-2/FLU/RSV plus assay is intended as an aid in the diagnosis of influenza from Nasopharyngeal swab specimens and should not be used as a sole basis for treatment. Nasal washings and aspirates are unacceptable for Xpert Xpress SARS-CoV-2/FLU/RSV testing.  Fact  Sheet for Patients: EntrepreneurPulse.com.au  Fact Sheet for Healthcare Providers: IncredibleEmployment.be  This test is not yet approved or cleared by the [place] FDA and has been authorized for detection and/or diagnosis of SARS-CoV-2 by FDA under an Emergency Use Authorization (EUA). This EUA will remain in effect (meaning this test can be used) for the duration of the COVID-19 declaration under Section 564(b)(1) of the Act, 21 U.S.C. section 360bbb-3(b)(1), unless the authorization is terminated or revoked.  Performed at [facility], [address], [place], [place] [address]   Culture, blood (routine x 2)     Status: None (Preliminary result)   Collection Time: 12/24/21  8:05 AM   Specimen: Left Antecubital; Blood  Result Value Ref Range Status   Specimen Description   Final    LEFT ANTECUBITAL BOTTLES DRAWN AEROBIC AND ANAEROBIC   Special Requests Blood Culture adequate volume  Final   Culture   Final    NO GROWTH 1 DAY Performed at [facility], [address], [place], [place] [address]    Report Status PENDING  Incomplete  Culture, blood (routine x 2)     Status: None (Preliminary result)   Collection Time: 12/24/21  8:07 AM   Specimen: BLOOD RIGHT HAND  Result Value Ref Range Status   Specimen Description   Final    BLOOD RIGHT HAND BOTTLES DRAWN AEROBIC AND ANAEROBIC   Special Requests Blood Culture adequate volume  Final   Culture   Final    NO GROWTH 1 DAY Performed at [facility],  [address], [place], [place] [address]    Report Status PENDING  Incomplete         Radiology Studies: NM Pulmonary Perfusion  Result Date: 12/24/2021 CLINICAL DATA:  81 year old male with respiratory distress, shortness of breath. EXAM: NUCLEAR MEDICINE PERFUSION LUNG SCAN TECHNIQUE: Perfusion images were obtained in multiple projections after intravenous injection of radiopharmaceutical. Ventilation scans intentionally deferred if perfusion scan and chest x-ray adequate for interpretation during COVID 19 epidemic. RADIOPHARMACEUTICALS:  4.4 mCi Tc-50m MAA IV COMPARISON:  Noncontrast CT Chest, Abdomen, and Pelvis 0627 hours today. FINDINGS: In conjunction with the chest CT appearance earlier today the bilateral pulmonary perfusion radiotracer activity is fairly homogeneous and within normal limits. No convincing perfusion defect. IMPRESSION: No pulmonary perfusion abnormality, no evidence of pulmonary embolus. Electronically Signed   By: Genevie Ann M.D.   On: 12/24/2021 11:54   ECHOCARDIOGRAM COMPLETE  Result Date: 12/25/2021    ECHOCARDIOGRAM REPORT   Patient Name:   DONTRAE MORINI Date of Exam: 12/25/2021 Medical Rec #:  [id number]     Height:       66.0 in Accession #:    [id number]    Weight:       202.4 lb Date of Birth:  [birth year]      BSA:          2.010 m Patient Age:    11 years      BP:           122/67 mmHg Patient Gender: M             HR:           85 bpm. Exam Location:  Inpatient Procedure: 2D Echo, Cardiac Doppler, Color Doppler and Intracardiac            Opacification Agent Indications:    NSTEMI  History:        Patient has prior history of Echocardiogram examinations. CHF,  CAD; Risk Factors:Hypertension and Sleep Apnea.  Sonographer:    Jyl Heinz Referring Phys: [phone] SEYED A SHAHMEHDI IMPRESSIONS  1. Left ventricular ejection fraction, by estimation, is 35%. The left ventricle has moderately decreased function. The left ventricle demonstrates global hypokinesis. The left  ventricular internal cavity size was mildly dilated. There is mild left ventricular hypertrophy. Left ventricular diastolic parameters are consistent with Grade II diastolic dysfunction (pseudonormalization).  2. Right ventricular systolic function is normal. The right ventricular size is normal. Tricuspid regurgitation signal is inadequate for assessing PA pressure.  3. The mitral valve is normal in structure. Trivial mitral valve regurgitation. No evidence of mitral stenosis.  4. The aortic valve is tricuspid. There is mild calcification of the aortic valve. Aortic valve regurgitation is not visualized. No aortic stenosis is present. FINDINGS  Left Ventricle: Left ventricular ejection fraction, by estimation, is 35%. The left ventricle has moderately decreased function. The left ventricle demonstrates global hypokinesis. The left ventricular internal cavity size was mildly dilated. There is mild left ventricular hypertrophy. Left ventricular diastolic parameters are consistent with Grade II diastolic dysfunction (pseudonormalization). Right Ventricle: The right ventricular size is normal. No increase in right ventricular wall thickness. Right ventricular systolic function is normal. Tricuspid regurgitation signal is inadequate for assessing PA pressure. Left Atrium: Left atrial size was normal in size. Right Atrium: Right atrial size was normal in size. Pericardium: Trivial pericardial effusion is present. Mitral Valve: The mitral valve is normal in structure. There is mild calcification of the mitral valve leaflet(s). Mild mitral annular calcification. Trivial mitral valve regurgitation. No evidence of mitral valve stenosis. Tricuspid Valve: The tricuspid valve is normal in structure. Tricuspid valve regurgitation is not demonstrated. Aortic Valve: The aortic valve is tricuspid. There is mild calcification of the aortic valve. Aortic valve regurgitation is not visualized. No aortic stenosis is present. Aortic  valve peak gradient measures 8.1 mmHg. Pulmonic Valve: The pulmonic valve was normal in structure. Pulmonic valve regurgitation is not visualized. Aorta: The aortic root is normal in size and structure. IAS/Shunts: No atrial level shunt detected by color flow Doppler.  LEFT VENTRICLE PLAX 2D LVIDd:         5.60 cm      Diastology LVIDs:         4.20 cm      LV e' medial:    3.92 cm/s LV PW:         1.20 cm      LV E/e' medial:  26.0 LV IVS:        1.20 cm      LV e' lateral:   4.68 cm/s LVOT diam:     2.00 cm      LV E/e' lateral: 21.8 LV SV:         52 LV SV Index:   26 LVOT Area:     3.14 cm  LV Volumes (MOD) LV vol d, MOD A2C: 141.0 ml LV vol d, MOD A4C: 121.0 ml LV vol s, MOD A2C: 85.3 ml LV vol s, MOD A4C: 68.1 ml LV SV MOD A2C:     55.7 ml LV SV MOD A4C:     121.0 ml LV SV MOD BP:      56.8 ml RIGHT VENTRICLE RV Basal diam:  2.60 cm RV Mid diam:    1.70 cm RV S prime:     10.90 cm/s TAPSE (M-mode): 2.4 cm LEFT ATRIUM             Index  RIGHT ATRIUM           Index LA diam:        4.40 cm 2.19 cm/m   RA Area:     13.50 cm LA Vol (A2C):   54.5 ml 27.12 ml/m  RA Volume:   28.60 ml  14.23 ml/m LA Vol (A4C):   43.1 ml 21.44 ml/m LA Biplane Vol: 49.5 ml 24.63 ml/m  AORTIC VALVE AV Area (Vmax): 1.81 cm AV Vmax:        142.00 cm/s AV Peak Grad:   8.1 mmHg LVOT Vmax:      81.60 cm/s LVOT Vmean:     63.600 cm/s LVOT VTI:       0.165 m  AORTA Ao Root diam: 3.10 cm Ao Asc diam:  3.00 cm MITRAL VALVE MV Area (PHT): 4.77 cm     SHUNTS MV Decel Time: 159 msec     Systemic VTI:  0.16 m MV E velocity: 102.00 cm/s  Systemic Diam: 2.00 cm MV A velocity: 72.00 cm/s MV E/A ratio:  1.42 Dalton McleanMD Electronically signed by Franki Monte Signature Date/Time: 12/25/2021/5:52:41 PM    Final         Scheduled Meds:  allopurinol  300 mg Oral Daily   aspirin  81 mg Oral Daily   carvedilol  12.5 mg Oral BID WC   Chlorhexidine Gluconate Cloth  6 each Topical Daily   cholecalciferol  1,000 Units Oral Daily    folic acid  1 mg Oral Daily   gabapentin  300 mg Oral BID   insulin aspart  0-20 Units Subcutaneous TID WC   insulin glargine-yfgn  43 Units Subcutaneous Daily   methylPREDNISolone (SOLU-MEDROL) injection  80 mg Intravenous Q12H   multivitamin with minerals  1 tablet Oral Daily   pantoprazole  40 mg Oral Daily   rosuvastatin  20 mg Oral QPM   sertraline  25 mg Oral QPM   sodium chloride flush  3 mL Intravenous Q12H   sodium chloride flush  3 mL Intravenous Q12H   tamsulosin  0.4 mg Oral QPM   thiamine  100 mg Oral Daily   Or   thiamine  100 mg Intravenous Daily   Continuous Infusions:  sodium chloride Stopped (12/24/21 1700)   heparin 1,550 Units/hr (12/26/21 0716)   levofloxacin (LEVAQUIN) IV     nitroGLYCERIN 5 mcg/min (12/25/21 7782)    Assessment & Plan:   Principal Problem:   NSTEMI (non-ST elevated myocardial infarction) (HCC) Active Problems:   Acute exacerbation of CHF (congestive heart failure) (HCC)   Acute respiratory failure with hypoxia and hypercapnia (HCC)   Hypoxia   Essential hypertension, malignant   D-dimer, elevated   CAD (coronary artery disease) of artery bypass graft   Cardiomyopathy, ischemic   Mixed hyperlipidemia   Peripheral vascular disease (HCC)   Coronary atherosclerosis of native coronary artery   Elevated troponin   Obstructive sleep apnea   BPH (benign prostatic hyperplasia)   CKD (chronic kidney disease), stage III (HCC)   Diabetic neuropathy (HCC)   NSTEMI (non-ST elevated myocardial infarction) (HCC) Elevated troponin Currently chest pain-free Cardiology was following Status post aspirin load, continue aspirin 81 mg daily Continue heparin drip, statin, beta-blockers No ACE or ARB given AKI Continue nitrate drip titrated to chest pain 3/7  Echo with EF 35% with global hypokinesis.  Grade 2 diastolic dysfunction.  hold off on catheterization at this time given renal function       Acute respiratory failure with  hypoxia and  hypercapnia (HCC) - Multifactorial likely diastolic CHF exacerbation, volume overload, Pna Initially was placed on BiPAP could not tolerate due to vomiting then placed on NRB. Weaning O2 as tolerated to keep O2 sats above 92% 3/7 transition IV steroids to p.o. 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 As cautionary , will place on CIWA protocal He was counseled about alcohol cessation   D-dimer, elevated - Likely acute reactant -Due to CKD, obtaining V/Q nuclear scan -Shortness with hypoxia most likely due to CHF exacerbation, congestion, pneumonia, -We will monitor closely      Diabetic neuropathy (HCC) - Stable currently not on any medication -Strict glycemic control      BPH (benign prostatic hyperplasia) - Monitoring, PSA -Continue Flomax   Obstructive sleep apnea - Noncompliant with CPAP  -Not on supplemental O2 at home -Courage patient to be compliant with his home CPAP -Could not tolerate BiPAP currently on high flow O2 tolerating    CAD H/xo cabg Ischemic Cm - Continue home  medication    Peripheral vascular disease (HCC) - Continue aspirin, continue statins   Mixed hyperlipidemia - Continue Crestor           DVT prophylaxis: Heparin Code Status: Full Family Communication: None at bedside Disposition Plan:  Status is: Inpatient Remains inpatient appropriate because: IV treatment.  Plan for cardiac cath.        LOS: 2 days   Time spent: 80min with >50% on coc    Nolberto Hanlon, MD [facility] Pager [phone]  If 7PM-7AM, please contact night-coverage 12/26/2021, 8:16 AM

## 2021-12-26 NOTE — Evaluation (Signed)
Physical Therapy Evaluation ?Patient Details ?Name: John [name] ?MRN: [id number] ?DOB: December 20, 1940 ?Today's Date: 12/26/2021 ? ?History of Present Illness ? Patient is an 81 yo male presenting to [facility] 3/[facility] with NSTEMI. Significantly elevated troponin levels in ED and new onset of A-fib. 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 Does use oxygen at home PRN.  ?Clinical Impression ? Pt presents with decreased endurance, functional strength, and overall vital stability secondary to diagnosis above. These impairments are limiting his ability to safely and independently transfer, get into his home, perform all adls/iadls, and ambulate in the community. Pt to benefit from acute PT to address deficits. Functional bed mobility, transfers, and gait was performed and pt ambulated 100 feet with rolling walker. The pt.'s vital signs remained stable throughout the session but required 5-6L of O2.  Pt. Responded well but was limited secondary to overall endurance and strength. SPT recommends home health follow up to further improve his functional capacity once medically stable for discharge. PT to progress mobility as tolerated, and will continue to follow acutely.  ?   ?   ? ?Recommendations for follow up therapy are one component of a multi-disciplinary discharge planning process, led by the attending physician.  Recommendations may be updated based on patient status, additional functional criteria and insurance authorization. ? ?Follow Up Recommendations Home health PT ? ?  ?Assistance Recommended at Discharge PRN  ?Patient can return home with the following ? A little help with walking and/or transfers;A little help with bathing/dressing/bathroom;Assist for transportation;Assistance with cooking/housework ? ?  ?Equipment Recommendations    ?Recommendations for Other Services ?    ?   ?Functional Status Assessment Patient has had a recent decline in their functional status and demonstrates the ability to make significant improvements in function in a reasonable and predictable amount of time.  ? ?  ?Precautions / Restrictions Precautions ?Precautions: Fall;Other (comment) ?Precaution Comments: monitor O2 (does not wear at baseline) ?Restrictions ?Weight Bearing Restrictions: No  ? ?  ? ?Mobility ? Bed Mobility ?Overal bed mobility: Modified Independent ?  ?  ?  ?  ?  ?  ?General bed mobility comments: Increased time and management of lines ?  ? ?Transfers ?Overall transfer level: Needs assistance ?Equipment used: Rolling walker (2 wheels) ?Transfers: Sit to/from Stand ?Sit to Stand: Supervision ?  ?  ?  ?  ?  ?General transfer comment: Pt. able to come to full stand and self steady ?  ? ?Ambulation/Gait ?Ambulation/Gait assistance: Supervision ?Gait Distance (Feet): 100 Feet ?Assistive device: Rolling walker (2 wheels) ?Gait Pattern/deviations: WFL(Within Functional Limits) ?Gait velocity: decreased ?  ?  ?General Gait Details: Monitored O2 saturation throughout, maintained saturation on 6L SPO2 >90%. Pt. limited secondary to fatigue but states that this is close to his baseline capacity. ? ?Stairs ?  ?  ?  ?  ?  ? ?Wheelchair Mobility ?  ? ?Modified Rankin (Stroke Patients Only) ?  ? ?  ? ?Balance Overall balance assessment: Needs assistance ?Sitting-balance support: Feet supported, No upper extremity supported ?Sitting balance-Leahy Scale: Good ?  ?  ?Standing balance support: Bilateral upper extremity supported, No upper extremity supported, During functional activity ?Standing balance-Leahy Scale: Fair ?Standing balance comment: BUE use for standing and while walking ?  ?  ?  ?  ?  ?  ?  ?  ?  ?  ?  ?   ? ? ? ?  Pertinent Vitals/Pain Pain Assessment ?Pain Assessment: No/denies pain  ? ? ?Home Living Family/patient expects to be discharged to:: Private residence ?Living Arrangements: Children  (daughter and son-in-law) ?Available Help at Discharge: Available PRN/intermittently ?Type of Home: House ?Home Access: Stairs to enter ?Entrance Stairs-Rails: Left ?Entrance Stairs-Number of Steps: 3 ?  ?Home Layout: One level ?Home Equipment: Cane - single point;Rollator (4 wheels);Shower seat;Wheelchair - manual ?Additional Comments: Daughter and SIL work  ?  ?Prior Function Prior Level of Function : Independent/Modified Independent ?  ?  ?  ?  ?  ?  ?Mobility Comments: use of Rollator for mobility ?ADLs Comments: Modified Independent with ADLs (uses shower chair); can do basic IADLs though family usually assists with meals, etc ?  ? ? ?Hand Dominance  ? Dominant Hand: Right ? ?  ?Extremity/Trunk Assessment  ? Upper Extremity Assessment ?Upper Extremity Assessment: Defer to OT evaluation ?  ? ?Lower Extremity Assessment ?Lower Extremity Assessment: RLE deficits/detail;LLE deficits/detail ?RLE Deficits / Details: Reports R ankle pain is the limiting factor usually to his mobility at home ?RLE Sensation: history of peripheral neuropathy ?LLE Sensation: history of peripheral neuropathy ?  ? ?Cervical / Trunk Assessment ?Cervical / Trunk Assessment: Normal  ?Communication  ? Communication: No difficulties  ?Cognition Arousal/Alertness: Awake/alert ?Behavior During Therapy: [facility] for tasks assessed/performed ?Overall Cognitive Status: Within Functional Limits for tasks assessed ?  ?  ?  ?  ?  ?  ?  ?  ?  ?  ?  ?  ?  ?  ?  ?  ?  ?  ?  ? ?  ?General Comments General comments (skin integrity, edema, etc.): SP02>90% on 5L, then turned to 6L for mobility ? ?  ?Exercises    ? ?Assessment/Plan  ?  ?PT Assessment Patient needs continued PT services  ?PT Problem List Decreased strength;Decreased mobility;Decreased range of motion;Decreased coordination;Decreased activity tolerance;Decreased cognition;Decreased balance;Impaired sensation;Cardiopulmonary status limiting activity ? ?   ?  ?PT Treatment Interventions DME  instruction;Therapeutic exercise;Gait training;Balance training;Stair training;Neuromuscular re-education;Functional mobility training;Therapeutic activities   ? ?PT Goals (Current goals can be found in the Care Plan section)  ?Acute Rehab PT Goals ?Patient Stated Goal: Reduce dependance on external O2 to return home ?PT Goal Formulation: With patient ?Time For Goal Achievement: 01/09/22 ?Potential to Achieve Goals: Good ? ?  ?Frequency Min 3X/week ?  ? ? ?Co-evaluation   ?  ?  ?  ?  ? ? ?  ?AM-PAC PT "6 Clicks" Mobility  ?Outcome Measure Help needed turning from your back to your side while in a flat bed without using bedrails?: None ?Help needed moving from lying on your back to sitting on the side of a flat bed without using bedrails?: None ?Help needed moving to and from a bed to a chair (including a wheelchair)?: None ?Help needed standing up from a chair using your arms (e.g., wheelchair or bedside chair)?: A Little ?Help needed to walk in hospital room?: A Little ?Help needed climbing 3-5 steps with a railing? : A Little ?6 Click Score: 21 ? ?  ?End of Session Equipment Utilized During Treatment: Oxygen ?Activity Tolerance: Patient tolerated treatment well ?Patient left: in chair;with call bell/phone within reach ?Nurse Communication: Mobility status ?PT Visit Diagnosis: Other abnormalities of gait and mobility (R26.89);Muscle weakness (generalized) (M62.81) ?  ? ?Time: 5427-0623 ?PT Time Calculation (min) (ACUTE ONLY): 27 min ? ? ?Charges:   PT Evaluation ?$PT Eval Moderate Complexity: 1 Mod ?PT Treatments ?$Therapeutic Activity: 8-22 mins ?  ?   ? ? ?  Thermon Leyland, SPT ?Acute Rehab Services ? ? ?Thermon Leyland ?12/26/2021, 12:15 PM ? ?

## 2021-12-26 NOTE — Evaluation (Signed)
Occupational Therapy Evaluation ?Patient Details ?Name: John [name] ?MRN: [id number] ?DOB: 10-Sep-1941 ?Today's Date: 12/26/2021 ? ? ?History of Present Illness Demoni Gergen is a 81 yo M presented with shortness of breath, acute onset difficulty breathing for the past 2 to 3 hours.Found to have acute respiratory failure with hypoxia and hypercapnia-Multifactorial likely diastolic CHF exacerbation, volume overload, possible pneumonia and Type I NSTEMI. PMH/o DMII, OSA, dCHF, BPH, CAD, HTN, HLD CVA, PVD  ? ?Clinical Impression ?  ?PTA, pt lives with family, typically Modified Independent with ADLs and mobility using Rollator. Family assist with IADLs prn. Pt presents now with deficits in cardiopulmonary endurance with new supplemental O2 requirements (5-6 L O2). Pt overall Setup-Supervision for ADLs/transfers using RW. Emphasis on energy conservation education (handout provided) with pt actively implementing some of these strategies already due to baseline SOB. Plan to follow acutely to progress ADL endurance though anticipate no OT needs at DC.   ?   ? ?Recommendations for follow up therapy are one component of a multi-disciplinary discharge planning process, led by the attending physician.  Recommendations may be updated based on patient status, additional functional criteria and insurance authorization.  ? ?Follow Up Recommendations ? No OT follow up  ?  ?Assistance Recommended at Discharge Set up Supervision/Assistance  ?Patient can return home with the following Assistance with cooking/housework;Assist for transportation;Help with stairs or ramp for entrance ? ?  ?Functional Status Assessment ? Patient has had a recent decline in their functional status and demonstrates the ability to make significant improvements in function in a reasonable and predictable amount of time.  ?Equipment Recommendations ? None recommended by OT  ?  ?Recommendations for Other Services   ? ? ?  ?Precautions / Restrictions  Precautions ?Precautions: Fall;Other (comment) ?Precaution Comments: monitor O2 (does not wear at baseline) ?Restrictions ?Weight Bearing Restrictions: No  ? ?  ? ?Mobility Bed Mobility ?  ?  ?  ?  ?  ?  ?  ?General bed mobility comments: up in chair on entry ?  ? ?Transfers ?Overall transfer level: Needs assistance ?Equipment used: Rolling walker (2 wheels) ?Transfers: Sit to/from Stand ?Sit to Stand: Supervision ?  ?  ?  ?  ?  ?General transfer comment: Pt reports using walker to "counterbalance" in standing, able to demo lifting RW up in air and rocking forward to stand. Pt also able to return demo safely pushing from armrests to stand; educated on using this method for more bodily control/safety ?  ? ?  ?Balance Overall balance assessment: Needs assistance ?Sitting-balance support: Feet supported, No upper extremity supported ?Sitting balance-Leahy Scale: Good ?  ?  ?Standing balance support: Bilateral upper extremity supported, No upper extremity supported, During functional activity ?Standing balance-Leahy Scale: Fair ?Standing balance comment: fair static standing without UE support, BUE helpful for mobility ?  ?  ?  ?  ?  ?  ?  ?  ?  ?  ?  ?   ? ?ADL either performed or assessed with clinical judgement  ? ?ADL Overall ADL's : Needs assistance/impaired ?Eating/Feeding: Independent ?  ?Grooming: Set up;Sitting ?  ?Upper Body Bathing: Set up;Sitting ?  ?Lower Body Bathing: Supervison/ safety;Sit to/from stand ?  ?Upper Body Dressing : Set up;Sitting ?  ?Lower Body Dressing: Supervision/safety;Sit to/from stand ?  ?Toilet Transfer: Supervision/safety;Ambulation;Rolling walker (2 wheels) ?  ?Toileting- Clothing Manipulation and Hygiene: Supervision/safety;Sit to/from stand ?  ?  ?  ?  ?General ADL Comments: Pt limited by decreased cardiopulmonary tolerance (increased from  baseline). Focus on energy conservation education for ADLs, mobility with pt actively engaged in education  ? ? ? ?Vision Baseline  Vision/History: 1 Wears glasses ?Ability to See in Adequate Light: 0 Adequate ?Patient Visual Report: No change from baseline ?Vision Assessment?: No apparent visual deficits  ?   ?Perception   ?  ?Praxis   ?  ? ?Pertinent Vitals/Pain Pain Assessment ?Pain Assessment: No/denies pain  ? ? ? ?Hand Dominance Right ?  ?Extremity/Trunk Assessment Upper Extremity Assessment ?Upper Extremity Assessment: Overall WFL for tasks assessed ?  ?Lower Extremity Assessment ?Lower Extremity Assessment: Defer to PT evaluation ?  ?Cervical / Trunk Assessment ?Cervical / Trunk Assessment: Normal ?  ?Communication Communication ?Communication: No difficulties ?  ?Cognition Arousal/Alertness: Awake/alert ?Behavior During Therapy: [facility] for tasks assessed/performed ?Overall Cognitive Status: Within Functional Limits for tasks assessed ?  ?  ?  ?  ?  ?  ?  ?  ?  ?  ?  ?  ?  ?  ?  ?  ?  ?  ?  ?General Comments  SpO2 >95% on 5-6 L O2 with basic standing/ADL tasks ? ?  ?Exercises   ?  ?Shoulder Instructions    ? ? ?Home Living Family/patient expects to be discharged to:: Private residence ?Living Arrangements: Children (daughter and son-in-law) ?Available Help at Discharge: Available PRN/intermittently ?Type of Home: House ?Home Access: Stairs to enter ?Entrance Stairs-Number of Steps: 3 ?Entrance Stairs-Rails: Left ?Home Layout: One level ?  ?  ?Bathroom Shower/Tub: Walk-in shower ?  ?Bathroom Toilet: Standard ?Bathroom Accessibility: Yes ?  ?Home Equipment: Cane - single point;Rollator (4 wheels);Shower seat;Wheelchair - manual ?  ?Additional Comments: Daughter and SIL work ?  ? ?  ?Prior Functioning/Environment Prior Level of Function : Independent/Modified Independent ?  ?  ?  ?  ?  ?  ?Mobility Comments: use of Rollator for mobility ?ADLs Comments: Modified Independent with ADLs (uses shower chair); can do basic IADLs though family usually assists with meals, etc ?  ? ?  ?  ?OT Problem List: Decreased activity tolerance;Cardiopulmonary  status limiting activity ?  ?   ?OT Treatment/Interventions: Self-care/ADL training;Energy conservation;DME and/or AE instruction;Therapeutic exercise;Therapeutic activities;Patient/family education;Balance training  ?  ?OT Goals(Current goals can be found in the care plan section) Acute Rehab OT Goals ?Patient Stated Goal: go home soon ?OT Goal Formulation: With patient ?Time For Goal Achievement: 01/09/22 ?Potential to Achieve Goals: Good  ?OT Frequency: Min 2X/week ?  ? ?Co-evaluation   ?  ?  ?  ?  ? ?  ?AM-PAC OT "6 Clicks" Daily Activity     ?Outcome Measure Help from another person eating meals?: None ?Help from another person taking care of personal grooming?: A Little ?Help from another person toileting, which includes using toliet, bedpan, or urinal?: A Little ?Help from another person bathing (including washing, rinsing, drying)?: A Little ?Help from another person to put on and taking off regular upper body clothing?: A Little ?Help from another person to put on and taking off regular lower body clothing?: A Little ?6 Click Score: 19 ?  ?End of Session Equipment Utilized During Treatment: Rolling walker (2 wheels);Oxygen ?Nurse Communication: Mobility status (RN present) ? ?Activity Tolerance: Patient tolerated treatment well ?Patient left: in chair;with call bell/phone within reach;with nursing/sitter in room ? ?OT Visit Diagnosis: Other (comment) (decreased cardiopulmonary tolerance)  ?              ?Time: 7425-9563 ?OT Time Calculation (min): 17 min ?Charges:  OT  General Charges ?$OT Visit: 1 Visit ?OT Evaluation ?$OT Eval Moderate Complexity: 1 Mod ? ?Almyra Free B, OTR/L ?Acute Rehab Services ?Office: [phone]  ? ?Layla Maw ?12/26/2021, 9:23 AM ?

## 2021-12-26 NOTE — Progress Notes (Addendum)
ANTICOAGULATION CONSULT NOTE ? ?Pharmacy Consult for IV heparin ?Indication: chest pain/ACS ? ?Allergies  ?Allergen Reactions  ? Penicillins Hives  ? Ticagrelor Shortness Of Breath  ? Ezetimibe-Simvastatin Other (See Comments)  ?  Myalgia ?  ? ? ?Patient Measurements: ?Height: 5\' 6"  (167.6 cm) ?Weight: 91.8 kg (202 lb 6.1 oz) ?IBW/kg (Calculated) : 63.8 ?Heparin Dosing Weight: 82.6 kg ? ?Vital Signs: ?Temp: 98 ?F (36.7 ?C) (03/07 4665) ?Temp Source: Oral (03/07 0432) ?BP: 147/76 (03/07 0432) ?Pulse Rate: 89 (03/07 0432) ? ?Labs: ?Recent Labs  ?  12/24/21 ?0419 12/24/21 ?0419 12/24/21 ?9935 12/24/21 ?1433 12/25/21 ?0122 12/25/21 ?1052 12/25/21 ?2001 12/26/21 ?0559  ?HGB 14.9  --   --   --  12.4*  --   --  11.7*  ?HCT 48.2  --   --   --  38.4*  --   --  36.6*  ?PLT 179  --   --   --  133*  --   --  133*  ?LABPROT  --   --   --   --  14.2  --   --   --   ?INR  --   --   --   --  1.1  --   --   --   ?HEPARINUNFRC  --    < >  --   --  <0.10* 0.26* 0.28* 0.85*  ?CREATININE 2.52*  --   --   --  3.37* 3.54*  --  3.55*  ?TROPONINIHS 39*  --  313* >24,000*  --   --   --   --   ? < > = values in this interval not displayed.  ? ? ? ?Estimated Creatinine Clearance: 17 mL/min (A) (by C-G formula based on SCr of 3.55 mg/dL (H)). ? ? ?Assessment: ?56 YOM presenting with chest pain, found to have an NSTEMI and started on IV heparin. Plan is for cath if renal function stabilizes, pharmacy to dose heparin.  ? ?Heparin level 0.86 and supratherapeutic after IV heparin gtt increase to 1700 units/h. Hgb and platelets remain stable this morning. No signs of bleeding or IV site issues per RN.  ? ?Goal of Therapy:  ?Heparin level 0.3-0.7 units/ml ?Monitor platelets by anticoagulation protocol: Yes ?  ?Plan:  ?Decrease IV heparin gtt to 1550 units/h ?8h heparin level check ?Daily heparin level, CBC ?Monitor for signs/symptoms of bleeding ?F/u plans for oral [facility] after cath ? ?Thank you for involving pharmacy in this patient's care. ? ?Elita Quick, PharmD ?PGY1 Ambulatory Care Pharmacy Resident ?12/26/2021 7:12 AM ? ? ?ADDENDUM: ?Heparin level 0.66 and within therapeutic goal on 8 hour check. No signs of bleeding or IV site issues noted. Hemoglobin and platelets remain stable.  ? ?Plan: ?Continue IV heparin gtt at 1550 units/h ?8h confirmatory heparin level check ?Daily heparin level, CBC ?Monitor for signs/symptoms of bleeding ?F/u plans for oral [facility] after cath ? ?Thank you for involving pharmacy in this patient's care. ? ?Elita Quick, PharmD ?PGY1 Ambulatory Care Pharmacy Resident ?12/26/2021 3:49 PM ? ?**Pharmacist phone directory can be found on [email or web address] listed under [place]** ?

## 2021-12-26 NOTE — Progress Notes (Addendum)
Progress Note  Patient Name: Teresa Lemmerman Date of Encounter: 12/26/2021  Primary Cardiologist: Kirk Ruths MD  Subjective   Continues to deny chest pain. Able to ambulate on the floor, was short of breath returning to his room but states he did not have any chest pain with this. -  Inpatient Medications    Scheduled Meds:  allopurinol  300 mg Oral Daily   aspirin  81 mg Oral Daily   carvedilol  12.5 mg Oral BID WC   Chlorhexidine Gluconate Cloth  6 each Topical Daily   cholecalciferol  1,000 Units Oral Daily   folic acid  1 mg Oral Daily   gabapentin  300 mg Oral BID   insulin aspart  0-20 Units Subcutaneous TID WC   insulin glargine-yfgn  43 Units Subcutaneous Daily   methylPREDNISolone (SOLU-MEDROL) injection  80 mg Intravenous Q12H   multivitamin with minerals  1 tablet Oral Daily   pantoprazole  40 mg Oral Daily   rosuvastatin  20 mg Oral QPM   sertraline  25 mg Oral QPM   sodium chloride flush  3 mL Intravenous Q12H   sodium chloride flush  3 mL Intravenous Q12H   tamsulosin  0.4 mg Oral QPM   thiamine  100 mg Oral Daily   Or   thiamine  100 mg Intravenous Daily   Continuous Infusions:  sodium chloride Stopped (12/24/21 1700)   heparin 1,550 Units/hr (12/26/21 0716)   levofloxacin (LEVAQUIN) IV 500 mg (12/26/21 1150)   nitroGLYCERIN 5 mcg/min (12/25/21 0614)   PRN Meds: sodium chloride, acetaminophen **OR** acetaminophen, bisacodyl, hydrALAZINE, HYDROmorphone (DILAUDID) injection, ipratropium, levalbuterol, LORazepam **OR** LORazepam, ondansetron **OR** ondansetron (ZOFRAN) IV, oxyCODONE, senna-docusate, traZODone   Vital Signs    Vitals:   12/26/21 0005 12/26/21 0256 12/26/21 0432 12/26/21 0905  BP: 121/62  (!) 147/76 132/66  Pulse: 75 68 89 71  Resp: 17 15 15 18   Temp:   98 F (36.7 C) 97.8 F (36.6 C)  TempSrc:   Oral Oral  SpO2: 96% 96% 100% 96%  Weight:   91.8 kg   Height:        Intake/Output Summary (Last 24 hours) at 12/26/2021 1238 Last  data filed at 12/26/2021 8828 Gross per 24 hour  Intake 435.28 ml  Output 1000 ml  Net -564.72 ml     I/O since admission:   Filed Weights   12/24/21 0429 12/25/21 0521 12/26/21 0432  Weight: 89.2 kg 91.8 kg 91.8 kg    Telemetry    Few PVCs, NSR- Personally Reviewed  ECG    ECG (independently read by me): AM EKG pending  Physical Exam    BP 132/66 (BP Location: Left Arm)    Pulse 71    Temp 97.8 F (36.6 C) (Oral)    Resp 18    Ht 5\' 6"  (1.676 m)    Wt 91.8 kg    SpO2 96%    BMI 32.67 kg/m  Physical Exam  Constitutional: Well-developed, well-nourished, and in no distress.  HENT:  Head: Normocephalic and atraumatic.  Eyes: EOM are normal.  Neck: Normal range of motion.  Cardiovascular: Normal rate, regular rhythm, intact distal pulses. No gallop and no friction rub.  No murmur heard. No lower extremity edema  Pulmonary: Non labored breathing on 6L [place]  no wheezing or rales  Abdominal: Soft. Normal bowel sounds. Non distended and non tender Musculoskeletal: Normal range of motion.        General: No tenderness or edema.  Neurological: Alert and oriented to person, place, and time. Non focal  Skin: Skin is warm and dry.   Labs    Chemistry Recent Labs  Lab 12/24/21 0419 12/25/21 0122 12/25/21 1052 12/26/21 0559  NA 137 138 135 135  K 4.2 4.3 5.0 4.9  CL 101 103 99 97*  CO2 26 23 23 23   GLUCOSE 263* 75 196* 234*  BUN 38* 51* 57* 77*  CREATININE 2.52* 3.37* 3.54* 3.55*  CALCIUM 9.0 8.6* 8.7* 9.0  PROT 8.4*  --   --   --   ALBUMIN 4.2  --   --  3.3*  AST 32  --   --   --   ALT 25  --   --   --   ALKPHOS 74  --   --   --   BILITOT 0.9  --   --   --   GFRNONAA 25* 17* 16* 16*  ANIONGAP 10 12 13 15       Hematology Recent Labs  Lab 12/24/21 0419 12/25/21 0122 12/26/21 0559  WBC 13.3* 11.9* 10.5  RBC 5.08 4.20* 4.03*  HGB 14.9 12.4* 11.7*  HCT 48.2 38.4* 36.6*  MCV 94.9 91.4 90.8  MCH 29.3 29.5 29.0  MCHC 30.9 32.3 32.0  RDW 16.0* 15.8* 15.6*   PLT 179 133* 133*     Cardiac Enzymes Component Ref Range & Units 1 d ago (12/24/21) 1 d ago (12/24/21) 1 d ago (12/24/21) 3 mo ago (09/13/21) 3 mo ago (09/13/21) 3 mo ago (09/13/21) 3 mo ago (09/13/21)  Troponin I (High Sensitivity) <18 ng/L >24,000 High Panic   313 High Panic  CM  39 High  CM  484 High Panic  CM  489 High Panic  CM  82 High  CM  34 High  CM    BNP Recent Labs  Lab 12/24/21 0419 12/25/21 0122 12/26/21 0559  BNP 480.0* 1,009.5* 885.5*      DDimer  Recent Labs  Lab 12/24/21 0419  DDIMER 2.19*      Lipid Panel     Component Value Date/Time   CHOL 99 12/24/2021 2008   TRIG 95 12/24/2021 2008   HDL 51 12/24/2021 2008   CHOLHDL 1.9 12/24/2021 2008   VLDL 19 12/24/2021 2008   Harris 29 12/24/2021 2008      Radiology    ECHOCARDIOGRAM COMPLETE  Result Date: 12/25/2021    ECHOCARDIOGRAM REPORT   Patient Name:   EURIAH MATLACK Date of Exam: 12/25/2021 Medical Rec #:  [id number]     Height:       66.0 in Accession #:    [id number]    Weight:       202.4 lb Date of Birth:  1941/05/03      BSA:          2.010 m Patient Age:    81 years      BP:           122/67 mmHg Patient Gender: M             HR:           85 bpm. Exam Location:  Inpatient Procedure: 2D Echo, Cardiac Doppler, Color Doppler and Intracardiac            Opacification Agent Indications:    NSTEMI  History:        Patient has prior history of Echocardiogram examinations. CHF,  CAD; Risk Factors:Hypertension and Sleep Apnea.  Sonographer:    Jyl Heinz Referring Phys: [id number] SEYED A SHAHMEHDI IMPRESSIONS  1. Left ventricular ejection fraction, by estimation, is 35%. The left ventricle has moderately decreased function. The left ventricle demonstrates global hypokinesis. The left ventricular internal cavity size was mildly dilated. There is mild left ventricular hypertrophy. Left ventricular diastolic parameters are consistent with Grade II diastolic dysfunction (pseudonormalization).  2.  Right ventricular systolic function is normal. The right ventricular size is normal. Tricuspid regurgitation signal is inadequate for assessing PA pressure.  3. The mitral valve is normal in structure. Trivial mitral valve regurgitation. No evidence of mitral stenosis.  4. The aortic valve is tricuspid. There is mild calcification of the aortic valve. Aortic valve regurgitation is not visualized. No aortic stenosis is present. FINDINGS  Left Ventricle: Left ventricular ejection fraction, by estimation, is 35%. The left ventricle has moderately decreased function. The left ventricle demonstrates global hypokinesis. The left ventricular internal cavity size was mildly dilated. There is mild left ventricular hypertrophy. Left ventricular diastolic parameters are consistent with Grade II diastolic dysfunction (pseudonormalization). Right Ventricle: The right ventricular size is normal. No increase in right ventricular wall thickness. Right ventricular systolic function is normal. Tricuspid regurgitation signal is inadequate for assessing PA pressure. Left Atrium: Left atrial size was normal in size. Right Atrium: Right atrial size was normal in size. Pericardium: Trivial pericardial effusion is present. Mitral Valve: The mitral valve is normal in structure. There is mild calcification of the mitral valve leaflet(s). Mild mitral annular calcification. Trivial mitral valve regurgitation. No evidence of mitral valve stenosis. Tricuspid Valve: The tricuspid valve is normal in structure. Tricuspid valve regurgitation is not demonstrated. Aortic Valve: The aortic valve is tricuspid. There is mild calcification of the aortic valve. Aortic valve regurgitation is not visualized. No aortic stenosis is present. Aortic valve peak gradient measures 8.1 mmHg. Pulmonic Valve: The pulmonic valve was normal in structure. Pulmonic valve regurgitation is not visualized. Aorta: The aortic root is normal in size and structure. IAS/Shunts:  No atrial level shunt detected by color flow Doppler.  LEFT VENTRICLE PLAX 2D LVIDd:         5.60 cm      Diastology LVIDs:         4.20 cm      LV e' medial:    3.92 cm/s LV PW:         1.20 cm      LV E/e' medial:  26.0 LV IVS:        1.20 cm      LV e' lateral:   4.68 cm/s LVOT diam:     2.00 cm      LV E/e' lateral: 21.8 LV SV:         52 LV SV Index:   26 LVOT Area:     3.14 cm  LV Volumes (MOD) LV vol d, MOD A2C: 141.0 ml LV vol d, MOD A4C: 121.0 ml LV vol s, MOD A2C: 85.3 ml LV vol s, MOD A4C: 68.1 ml LV SV MOD A2C:     55.7 ml LV SV MOD A4C:     121.0 ml LV SV MOD BP:      56.8 ml RIGHT VENTRICLE RV Basal diam:  2.60 cm RV Mid diam:    1.70 cm RV S prime:     10.90 cm/s TAPSE (M-mode): 2.4 cm LEFT ATRIUM             Index  RIGHT ATRIUM           Index LA diam:        4.40 cm 2.19 cm/m   RA Area:     13.50 cm LA Vol (A2C):   54.5 ml 27.12 ml/m  RA Volume:   28.60 ml  14.23 ml/m LA Vol (A4C):   43.1 ml 21.44 ml/m LA Biplane Vol: 49.5 ml 24.63 ml/m  AORTIC VALVE AV Area (Vmax): 1.81 cm AV Vmax:        142.00 cm/s AV Peak Grad:   8.1 mmHg LVOT Vmax:      81.60 cm/s LVOT Vmean:     63.600 cm/s LVOT VTI:       0.165 m  AORTA Ao Root diam: 3.10 cm Ao Asc diam:  3.00 cm MITRAL VALVE MV Area (PHT): 4.77 cm     SHUNTS MV Decel Time: 159 msec     Systemic VTI:  0.16 m MV E velocity: 102.00 cm/s  Systemic Diam: 2.00 cm MV A velocity: 72.00 cm/s MV E/A ratio:  1.42 Dalton McleanMD Electronically signed by Franki Monte Signature Date/Time: 12/25/2021/5:52:41 PM    Final     Cardiac Studies         TTE 12/26/2021: EF 35%, LV with moderately decreased function. Mild LVH. Grade II diastolic dysfxn. RV Sys Fx nl. RV normal. MV is normal. Trivial MR. No MS. AV tricuspid mild calcification of AV. No AR.  TTE 09/14/21: EF estimated 40 to 45% with mildly decreased function.  Moderate LVH.  GRII DD-with mildly dilated left atrium.  Normal RV size and function.  Unable to assess RVP.  Normal RAP. Normal aortic  and mitral valve.    TTE 12/25/2021: EF~35% with moderately reduced function.  Global HK.  Mild LV dilation.  Mild LVH.  GRII DD.  Normal RV function, but unable to assess RVP.  Mild aortic valve calcification but no stenosis.  Patient Profile     81 y.o. 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 He was subsequently transferred to [facility] for evaluation for LHC.  => Unfortunate, his renal function has worsened overnight with diuresis.  Assessment & Plan    #Type I NSTEMI  Patient presented with acute onset chest pain, SOB, and diaphoresis and found to have markedly elevated troponins >24,000.  CT chest performed in the ED also revealed significant multivessel CAD involving the left main coronary as well. Patient was transferred to [facility] for evaluation for LHC. Echo 3/6 shows worsening EF compared with 05/2021 ECHO. Given patient's AKI (apparent BL ~2, sCr 3.55)  in the setting of contrast load for Cta chest and diuresis will hold on catheterization. In addition, patient remains asymptomatic.  -Continue heparin gtt -Continue nitroglycerin gtt -Continue Coreg 12.5mg  BID  -Continue to hold ACE/ARB given AKI  -Continue crestor 20mg  qd, LDL 29 -Hold on catheterization at this time given renal function  #Acute on Chronic HFrEF Most recent EF 35%. He continues to be euvolemic on exam. He does however remain on 6L of O2 to maintain his oxygen saturations at ~94%. Is and Os not  well recorded. Will continue to hold on further diuresis given elevated sCr although now appears to be the same as yesterday.  -Daily weights  -Strict I's and O's  Renal function  seems to have plateaued.  Hopefully this will begin his return of function.  Need to monitor for post ATN diuresis and  #New Onset Atrial Fibrillation Noted to go in and out of atrial fibrillation while in the ED with his atrial fibrillation captured on EKG. Telemetry reviewed this AM with patient mostly being in NSR. pAFib is likely secondary to his acute MI and hypoxia/hypercapnia. CHA2DS2-VASc score is 6. He is currently on heparin gtt for NSTEMI.  -Continue telemetry -Continue heparin -Rate control with Coreg => if there is significant recurrence, would have low threshold to consider chemical cardioversion with amiodarone, to avoid exacerbation CHF. -TTE as above indicating reduced EF from baseline.  #PVD  s/p R fem-bk-pop in 2019 -Continue ASA for now and crestor   #AKI on CKD stage 3 BL sCr ~2 now 3.37. Thought to be due to cardiorenal syndrome initially given pulmonary edema on exam. Patient was diuresed with worsening of his sCr this AM. Possible component of contrast induced AKI.  Appears to be euvolemic on exam though requiring 6L of oxygen.  -Will hold lasix and evaluate kidney function in the AM.   #Acute Hypoxic and Hypercapneic Respiratory Failure #Acute Pulmonary Edema #Possible CAP  Likely 2/2 pulmonary edema. Briefly tolerated BiPAP now breathing without distress on 6L O2.  -continue to wean O2 as tolerated -Aggressive pulm toilet  -diuresis as above -On levaquin for possible PNA  #BPH -continue home flomax, has foley catheter in place. Will likely discontinue in the AM.   Rick Duff, MD PGY-2 Internal Medicine  Pager [phone] 12/26/2021, 12:38 PM      ATTENDING ATTESTATION  I have seen, examined and evaluated the patient this morning on rounds along with Dr. Eulas Post (Resident Physician).  After reviewing all the available data and chart, we discussed the patients laboratory, study & physical findings as well as symptoms in detail. I agree with his findings,  examination as well as impression recommendations as per our discussion.    Attending adjustments noted in italics.  We basically performed the interview together.  He is doing fairly well, but renal function is still starting to plateau.  Hopefully will start seeing return of renal function.  Unfortunately this will delay his cardiac catheterization potentially even to the point of considering stabilization of his CHF and delayed cardiac catheterization in the outpatient setting.  Medication adjustments have been made.  No significant changes besides holding his diuretic at this point.  Continue nitroglycerin and heparin infusion, but will likely start to come off the nitroglycerin infusion and placed on Imdur.  We would then discontinue heparin to reassess symptoms.  We will continue to follow.    Glenetta Hew, M.D., M.S. Interventional Cardiologist   Pager # [phone] Phone # [phone] [address] [place], [place] [address]

## 2021-12-26 NOTE — Progress Notes (Signed)
Ok to reduce allopurinol to 200mg /day due to renal function per Dr Kurtis Bushman. ? ? ?Onnie Boer, PharmD, BCIDP, AAHIVP, CPP ?Infectious Disease Pharmacist ?12/26/2021 1:34 PM ? ? ?

## 2021-12-27 ENCOUNTER — Other Ambulatory Visit (HOSPITAL_COMMUNITY): Payer: Self-pay

## 2021-12-27 ENCOUNTER — Telehealth: Payer: Self-pay | Admitting: Cardiology

## 2021-12-27 ENCOUNTER — Other Ambulatory Visit: Payer: Self-pay

## 2021-12-27 DIAGNOSIS — I5041 Acute combined systolic (congestive) and diastolic (congestive) heart failure: Secondary | ICD-10-CM

## 2021-12-27 DIAGNOSIS — Z0279 Encounter for issue of other medical certificate: Secondary | ICD-10-CM

## 2021-12-27 DIAGNOSIS — I214 Non-ST elevation (NSTEMI) myocardial infarction: Secondary | ICD-10-CM | POA: Diagnosis not present

## 2021-12-27 LAB — CBC
HCT: 34.3 % — ABNORMAL LOW (ref 39.0–52.0)
Hemoglobin: 10.7 g/dL — ABNORMAL LOW (ref 13.0–17.0)
MCH: 28.5 pg (ref 26.0–34.0)
MCHC: 31.2 g/dL (ref 30.0–36.0)
MCV: 91.2 fL (ref 80.0–100.0)
Platelets: 126 10*3/uL — ABNORMAL LOW (ref 150–400)
RBC: 3.76 MIL/uL — ABNORMAL LOW (ref 4.22–5.81)
RDW: 15.5 % (ref 11.5–15.5)
WBC: 11.2 10*3/uL — ABNORMAL HIGH (ref 4.0–10.5)
nRBC: 0.2 % (ref 0.0–0.2)

## 2021-12-27 LAB — GLUCOSE, CAPILLARY
Glucose-Capillary: 195 mg/dL — ABNORMAL HIGH (ref 70–99)
Glucose-Capillary: 234 mg/dL — ABNORMAL HIGH (ref 70–99)
Glucose-Capillary: 254 mg/dL — ABNORMAL HIGH (ref 70–99)
Glucose-Capillary: 178 mg/dL — ABNORMAL HIGH (ref 70–99)

## 2021-12-27 LAB — BASIC METABOLIC PANEL
Anion gap: 10 (ref 5–15)
BUN: 91 mg/dL — ABNORMAL HIGH (ref 8–23)
CO2: 25 mmol/L (ref 22–32)
Calcium: 8.7 mg/dL — ABNORMAL LOW (ref 8.9–10.3)
Chloride: 98 mmol/L (ref 98–111)
Creatinine, Ser: 3.79 mg/dL — ABNORMAL HIGH (ref 0.61–1.24)
GFR, Estimated: 15 mL/min — ABNORMAL LOW (ref >60)
Glucose, Bld: 192 mg/dL — ABNORMAL HIGH (ref 70–99)
Potassium: 4.7 mmol/L (ref 3.5–5.1)
Sodium: 133 mmol/L — ABNORMAL LOW (ref 135–145)

## 2021-12-27 LAB — HEPARIN LEVEL (UNFRACTIONATED)
Heparin Unfractionated: 0.79 IU/mL — ABNORMAL HIGH (ref 0.30–0.70)
Heparin Unfractionated: 1.06 IU/mL — ABNORMAL HIGH (ref 0.30–0.70)
Heparin Unfractionated: 0.35 IU/mL (ref 0.30–0.70)

## 2021-12-27 LAB — BRAIN NATRIURETIC PEPTIDE: B Natriuretic Peptide: 649.9 pg/mL — ABNORMAL HIGH (ref 0.0–100.0)

## 2021-12-27 LAB — PHOSPHORUS: Phosphorus: 5.5 mg/dL — ABNORMAL HIGH (ref 2.5–4.6)

## 2021-12-27 MED ORDER — CLOPIDOGREL BISULFATE 75 MG PO TABS: 300.0000 mg | ORAL_TABLET | Freq: Every day | ORAL | Status: DC

## 2021-12-27 MED ORDER — CLOPIDOGREL BISULFATE 75 MG PO TABS
300.0000 mg | ORAL_TABLET | Freq: Once | ORAL | Status: AC
  Administered 2021-12-27: 300 mg via ORAL
  Filled 2021-12-27: qty 4

## 2021-12-27 MED ORDER — GABAPENTIN 300 MG PO CAPS
300.0000 mg | ORAL_CAPSULE | Freq: Every day | ORAL | Status: DC
  Administered 2021-12-28: 20:00:00 300 mg via ORAL
  Filled 2021-12-27: qty 1

## 2021-12-27 MED ORDER — ISOSORBIDE DINITRATE 10 MG PO TABS: 20.0000 mg | ORAL_TABLET | Freq: Three times a day (TID) | ORAL | Status: DC

## 2021-12-27 MED ORDER — HEPARIN (PORCINE) 25000 UT/250ML-% IV SOLN
1100.0000 [IU]/h | INTRAVENOUS | Status: DC
  Administered 2021-12-27 – 2021-12-28 (×2): 1100 [IU]/h via INTRAVENOUS
  Filled 2021-12-27: qty 250

## 2021-12-27 MED ORDER — INSULIN GLARGINE-YFGN 100 UNIT/ML ~~LOC~~ SOLN
50.0000 [IU] | Freq: Every day | SUBCUTANEOUS | Status: DC
  Administered 2021-12-28 – 2021-12-29 (×2): 50 [IU] via SUBCUTANEOUS
  Filled 2021-12-27 (×2): qty 0.5

## 2021-12-27 MED ORDER — ALLOPURINOL 100 MG PO TABS
100.0000 mg | ORAL_TABLET | Freq: Every day | ORAL | Status: DC
  Administered 2021-12-28 – 2021-12-29 (×2): 100 mg via ORAL
  Filled 2021-12-27 (×2): qty 1

## 2021-12-27 MED ORDER — HYDRALAZINE HCL 10 MG PO TABS: 10.0000 mg | ORAL_TABLET | Freq: Three times a day (TID) | ORAL | Status: DC

## 2021-12-27 MED ORDER — HYDRALAZINE HCL 10 MG PO TABS
10.0000 mg | ORAL_TABLET | Freq: Two times a day (BID) | ORAL | Status: DC
Start: 2021-12-27 — End: 2021-12-28
  Administered 2021-12-27 – 2021-12-28 (×3): 10 mg via ORAL
  Filled 2021-12-27 (×3): qty 1

## 2021-12-27 MED ORDER — LACTULOSE 10 GM/15ML PO SOLN
10.0000 g | Freq: Once | ORAL | Status: AC
  Administered 2021-12-27: 10 g via ORAL
  Filled 2021-12-27: qty 15

## 2021-12-27 MED ORDER — ISOSORBIDE DINITRATE 10 MG PO TABS
10.0000 mg | ORAL_TABLET | Freq: Two times a day (BID) | ORAL | Status: DC
  Administered 2021-12-27 – 2021-12-28 (×3): 10 mg via ORAL
  Filled 2021-12-27 (×3): qty 1

## 2021-12-27 NOTE — Consult Note (Signed)
[facility] Renal Consultation Note  Requesting MD:  Indication for Consultation: AKI on CKDIIIb  HPI:  John [name] is a 81 yo M with PMH of CKD-IIIb, T2DM, HFrEF, CAD s/p multiple stents, HTN, HLD, PVD, BPH who presented to [facility] with CP, SOB, and diaphoresis was found to have NSTEMI w/ troponins 24K. His acute respiratory failure required BiPAP then NRB and aggressive diuresis. CXR + for pulmonary edema. He was subsequently transferred to [facility] for evaluation for LHC. However, his renal function has worsened after his ACS event and after receiving aggressive diuresis. Cardiology has deferred [place] in the setting of his improvement of ACS symptoms and AKI. 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   On exam today, his CP has improved greatly as well as his SOB and hypoxemia. Currently sating well on RA. UOP adequate with foley cath. Afebrile. BP 137/71. EKG shows Afib with rate of 97. He states he has been out of his flomax for 2-3 weeks now.    [id number] potassium 4.7 chloride 98 CO2 25 BUN 91 creatinine 3.79 glucose 192 calcium 8.7 Phos 5.5 Hgb 10.7  12/26/21 Renal US: Bilateral renal cysts similar to that seen on recent CT examination. No acute abnormality noted.  Mild increased echogenicity consistent with medical renal disease.   Creatinine, Ser  Date/Time Value Ref Range Status  12/27/2021 01:23 AM 3.79 (H) 0.61 - 1.24 mg/dL Final  12/26/2021 05:59 AM 3.55 (H) 0.61 - 1.24 mg/dL Final  12/25/2021 10:52 AM 3.54 (H) 0.61 - 1.24 mg/dL Final  12/25/2021 01:22 AM 3.37 (H) 0.61 - 1.24 mg/dL Final  12/24/2021 04:19 AM 2.52 (H) 0.61 - 1.24 mg/dL Final  09/15/2021 05:23 AM 2.15 (H) 0.61 - 1.24 mg/dL Final  09/14/2021 04:23 AM 2.35 (H) 0.61 - 1.24 mg/dL Final  09/13/2021 02:22 AM 1.98 (H) 0.61 - 1.24 mg/dL Final  05/05/2021 02:38 PM 2.30 (H) 0.76 - 1.27 mg/dL Final  12/20/2020 02:46 AM 1.76 (H) 0.61 - 1.24 mg/dL Final  12/19/2020 12:43 PM 1.52 (H) 0.61  - 1.24 mg/dL Final  06/25/2013 09:36 AM 1.60 (H) 0.50 - 1.35 mg/dL Final     PMHx:   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)    BPH (benign prostatic hyperplasia)    CAD (coronary artery disease)    Cholesteatoma of attic    Depression    Diabetes mellitus ([place])    Ear disease    GERD (gastroesophageal reflux disease)    Hyperlipidemia    Hypertension    PVD (peripheral vascular disease) ([place])    Stroke [facility])     Past Surgical History:  Procedure Laterality Date   BONE ANCHORED HEARING AID IMPLANT Left 06/25/2013   Procedure: BONE ANCHORED HEARING AID (BAHA) IMPLANT;  Surgeon: Fannie Knee, MD;  Location: [place];  Service: ENT;  Laterality: Left;   CAROTID STENT INSERTION Left    COCHLEAR IMPLANT     CORONARY STENT PLACEMENT     MASS EXCISION Left 06/25/2013   Procedure: EXCISION LEFT TEMPORAL MASS;  Surgeon: Fannie Knee, MD;  Location: [place];  Service: ENT;  Laterality: Left;   TONSILLECTOMY      Family Hx:  Family History  Problem Relation Age of Onset   Diabetes Other    Diabetes Mother    Diabetes Maternal Aunt    Depression Other    Depression Other    Drug abuse Daughter  Diabetes Daughter     Social History:  reports that he quit smoking about 32 years ago. His smoking use included cigarettes. He has never used smokeless tobacco. He reports that he does not currently use alcohol. He reports that he does not use drugs.  Allergies:  Allergies  Allergen Reactions   Brilinta [Ticagrelor] Shortness Of Breath   Penicillins Hives   Ezetimibe-Simvastatin Other (See Comments)    Myalgia     Medications: Prior to Admission medications   Medication Sig Start Date End Date Taking? Authorizing Provider  albuterol (VENTOLIN HFA) 108 (90 Base) MCG/ACT inhaler Inhale 2 puffs into the lungs every 6 (six) hours as needed for wheezing or shortness of breath. 12/20/20  Yes Pokhrel, Laxman, MD   allopurinol (ZYLOPRIM) 300 MG tablet Take 300 mg by mouth daily. 06/21/12  Yes [provider]  aspirin 81 MG tablet Take 81 mg by mouth daily.   Yes [provider]  canagliflozin (INVOKANA) 100 MG TABS tablet Take 100 mg by mouth daily. 04/28/20  Yes [provider]  candesartan (ATACAND) 16 MG tablet Take 16 mg by mouth daily. 10/02/21  Yes [provider]  carvedilol (COREG) 12.5 MG tablet Take 1 tablet (12.5 mg total) by mouth 2 (two) times daily with a meal. 09/18/21  Yes Crenshaw, Denice Bors, MD  cholecalciferol (VITAMIN D3) 25 MCG (1000 UNIT) tablet Take 1,000 Units by mouth daily.   Yes [provider]  Cyanocobalamin (VITAMIN B 12 PO) Take 1 tablet by mouth daily.   Yes [provider]  furosemide (LASIX) 40 MG tablet Take 40 mg by mouth daily. 10/02/21  Yes [provider]  gabapentin (NEURONTIN) 300 MG capsule Take 300 mg by mouth 2 (two) times daily. 08/04/19  Yes [provider]  glimepiride (AMARYL) 4 MG tablet Take 4 mg by mouth daily with breakfast. 05/02/15  Yes [provider]  insulin glargine (LANTUS SOLOSTAR) 100 UNIT/ML Solostar Pen Inject 0-45 Units into the skin daily with supper. Dose per sliding scale. 10/01/19  Yes [provider]  pantoprazole (PROTONIX) 40 MG tablet Take 40 mg by mouth daily with breakfast. 08/19/14  Yes [provider]  rosuvastatin (CRESTOR) 20 MG tablet Take 1 tablet (20 mg total) by mouth every evening. 12/14/21  Yes Crenshaw, Denice Bors, MD  senna (SENOKOT) 8.6 MG tablet Take 2-3 tablets by mouth daily as needed for constipation.   Yes [provider]  sertraline (ZOLOFT) 25 MG tablet Take 1 tablet (25 mg total) by mouth every evening. 01/09/21  Yes Thayer Headings, PMHNP  tamsulosin (FLOMAX) 0.4 MG CAPS capsule Take 0.4 mg by mouth daily. 09/22/14  Yes [provider]      Labs:  Results for orders placed or performed during the hospital  encounter of 12/24/21 (from the past 48 hour(s))  Glucose, capillary     Status: Abnormal   Collection Time: 12/25/21 12:05 PM  Result Value Ref Range   Glucose-Capillary 201 (H) 70 - 99 mg/dL    Comment: Glucose reference range applies only to samples taken after fasting for at least 8 hours.  Glucose, capillary     Status: Abnormal   Collection Time: 12/25/21  3:58 PM  Result Value Ref Range   Glucose-Capillary 171 (H) 70 - 99 mg/dL    Comment: Glucose reference range applies only to samples taken after fasting for at least 8 hours.  Heparin level (unfractionated)     Status: Abnormal   Collection Time: 12/25/21  8:01 PM  Result Value Ref Range   Heparin Unfractionated 0.28 (L) 0.30 - 0.70 IU/mL    Comment: (NOTE) The clinical reportable range upper limit is being lowered to >1.10 to align with the FDA approved guidance for the current laboratory assay.  If heparin results are below expected values, and patient dosage has  been confirmed, suggest follow up testing of antithrombin III levels. Performed at [facility], [place] [address], [place], [place] [address]   Glucose, capillary     Status: Abnormal   Collection Time: 12/25/21  9:25 PM  Result Value Ref Range   Glucose-Capillary 233 (H) 70 - 99 mg/dL    Comment: Glucose reference range applies only to samples taken after fasting for at least 8 hours.   Comment 1 Notify RN    Comment 2 Document in Chart   CBC     Status: Abnormal   Collection Time: 12/26/21  5:59 AM  Result Value Ref Range   WBC 10.5 4.0 - 10.5 K/uL   RBC 4.03 (L) 4.22 - 5.81 MIL/uL   Hemoglobin 11.7 (L) 13.0 - 17.0 g/dL   HCT 36.6 (L) 39.0 - 52.0 %   MCV 90.8 80.0 - 100.0 fL   MCH 29.0 26.0 - 34.0 pg   MCHC 32.0 30.0 - 36.0 g/dL   RDW 15.6 (H) 11.5 - 15.5 %   Platelets 133 (L) 150 - 400 K/uL   nRBC 0.2 0.0 - 0.2 %    Comment: Performed at [facility], [place] [address], [place], [place] [address]  Brain natriuretic peptide     Status:  Abnormal   Collection Time: 12/26/21  5:59 AM  Result Value Ref Range   B Natriuretic Peptide 885.5 (H) 0.0 - 100.0 pg/mL    Comment: Performed at [facility] [address], [place], [place] [address]  Renal function panel     Status: Abnormal   Collection Time: 12/26/21  5:59 AM  Result Value Ref Range   Sodium 135 135 - 145 mmol/L   Potassium 4.9 3.5 - 5.1 mmol/L   Chloride 97 (L) 98 - 111 mmol/L   CO2 23 22 - 32 mmol/L   Glucose, Bld 234 (H) 70 - 99 mg/dL    Comment: Glucose reference range applies only to samples taken after fasting for at least 8 hours.   BUN 77 (H) 8 - 23 mg/dL   Creatinine, Ser 3.55 (H) 0.61 - 1.24 mg/dL   Calcium 9.0 8.9 - 10.3 mg/dL   Phosphorus 6.2 (H) 2.5 - 4.6 mg/dL   Albumin 3.3 (L) 3.5 - 5.0 g/dL   GFR, Estimated 16 (L) >60 mL/min    Comment: (NOTE) Calculated using the CKD-EPI Creatinine Equation (2021)    Anion gap 15 5 - 15    Comment: Performed at [facility] [address], [place], [place] [address]  Heparin level (unfractionated)     Status: Abnormal   Collection Time: 12/26/21  5:59 AM  Result Value Ref Range   Heparin Unfractionated 0.85 (H) 0.30 - 0.70 IU/mL    Comment: (NOTE) The clinical reportable range upper limit is being lowered to >1.10 to align with the FDA approved guidance for the current laboratory assay.  If heparin results are below expected values, and patient dosage has  been confirmed, suggest follow up testing of antithrombin III levels. Performed at [facility], [place] [address], [place], [place] [address]   Glucose, capillary  Status: Abnormal   Collection Time: 12/26/21  6:14 AM  Result Value Ref Range   Glucose-Capillary 240 (H) 70 - 99 mg/dL    Comment: Glucose reference range applies only to samples taken after fasting for at least 8 hours.   Comment 1 Notify RN    Comment 2 Document in Chart   Magnesium     Status: None   Collection Time: 12/26/21  6:19 AM  Result Value Ref Range    Magnesium 2.3 1.7 - 2.4 mg/dL    Comment: Performed at [facility], [place] [address], [place], [place] [address]  Glucose, capillary     Status: Abnormal   Collection Time: 12/26/21 12:37 PM  Result Value Ref Range   Glucose-Capillary 265 (H) 70 - 99 mg/dL    Comment: Glucose reference range applies only to samples taken after fasting for at least 8 hours.  Heparin level (unfractionated)     Status: None   Collection Time: 12/26/21  2:33 PM  Result Value Ref Range   Heparin Unfractionated 0.66 0.30 - 0.70 IU/mL    Comment: (NOTE) The clinical reportable range upper limit is being lowered to >1.10 to align with the FDA approved guidance for the current laboratory assay.  If heparin results are below expected values, and patient dosage has  been confirmed, suggest follow up testing of antithrombin III levels. Performed at [facility], [place] [address] [place], [place] [address]   Glucose, capillary     Status: Abnormal   Collection Time: 12/26/21  4:51 PM  Result Value Ref Range   Glucose-Capillary 285 (H) 70 - 99 mg/dL    Comment: Glucose reference range applies only to samples taken after fasting for at least 8 hours.  Urinalysis, Complete w Microscopic Urine, Catheterized     Status: Abnormal   Collection Time: 12/26/21  6:25 PM  Result Value Ref Range   Color, Urine YELLOW YELLOW   APPearance HAZY (A) CLEAR   Specific Gravity, Urine 1.015 1.005 - 1.030   pH 5.0 5.0 - 8.0   Glucose, UA >=500 (A) NEGATIVE mg/dL   Hgb urine dipstick MODERATE (A) NEGATIVE   Bilirubin Urine NEGATIVE NEGATIVE   Ketones, ur NEGATIVE NEGATIVE mg/dL   Protein, ur 30 (A) NEGATIVE mg/dL   Nitrite NEGATIVE NEGATIVE   Leukocytes,Ua TRACE (A) NEGATIVE   RBC / HPF 11-20 0 - 5 RBC/hpf   WBC, UA 6-10 0 - 5 WBC/hpf   Bacteria, UA FEW (A) NONE SEEN   Squamous Epithelial / LPF 0-5 0 - 5   Mucus PRESENT    Hyaline Casts, UA PRESENT    Granular Casts, UA PRESENT     Comment: Performed at  [facility], [address] [place], [place] [address]  Heparin level (unfractionated)     Status: Abnormal   Collection Time: 12/26/21  8:51 PM  Result Value Ref Range   Heparin Unfractionated 0.75 (H) 0.30 - 0.70 IU/mL    Comment: (NOTE) The clinical reportable range upper limit is being lowered to >1.10 to align with the FDA approved guidance for the current laboratory assay.  If heparin results are below expected values, and patient dosage has  been confirmed, suggest follow up testing of antithrombin III levels. Performed at [facility], [place] [address], [place], [place] [address]   Glucose, capillary     Status: Abnormal   Collection Time: 12/26/21  9:13 PM  Result Value Ref Range   Glucose-Capillary  247 (H) 70 - 99 mg/dL    Comment: Glucose reference range applies only to samples taken after fasting for at least 8 hours.   Comment 1 Notify RN    Comment 2 Document in Chart   CBC     Status: Abnormal   Collection Time: 12/27/21  1:23 AM  Result Value Ref Range   WBC 11.2 (H) 4.0 - 10.5 K/uL   RBC 3.76 (L) 4.22 - 5.81 MIL/uL   Hemoglobin 10.7 (L) 13.0 - 17.0 g/dL   HCT 34.3 (L) 39.0 - 52.0 %   MCV 91.2 80.0 - 100.0 fL   MCH 28.5 26.0 - 34.0 pg   MCHC 31.2 30.0 - 36.0 g/dL   RDW 15.5 11.5 - 15.5 %   Platelets 126 (L) 150 - 400 K/uL   nRBC 0.2 0.0 - 0.2 %    Comment: Performed at [facility], [place] [address], [place], [place] [address]  Brain natriuretic peptide     Status: Abnormal   Collection Time: 12/27/21  1:23 AM  Result Value Ref Range   B Natriuretic Peptide 649.9 (H) 0.0 - 100.0 pg/mL    Comment: Performed at [facility] [address] [place], [place] [address]  Heparin level (unfractionated)     Status: Abnormal   Collection Time: 12/27/21  1:23 AM  Result Value Ref Range   Heparin Unfractionated 0.79 (H) 0.30 - 0.70 IU/mL    Comment: (NOTE) The clinical reportable range upper limit is being lowered to >1.10 to align with the  FDA approved guidance for the current laboratory assay.  If heparin results are below expected values, and patient dosage has  been confirmed, suggest follow up testing of antithrombin III levels. Performed at [facility], [place] [address] [place], [place] [address]   Phosphorus     Status: Abnormal   Collection Time: 12/27/21  1:23 AM  Result Value Ref Range   Phosphorus 5.5 (H) 2.5 - 4.6 mg/dL    Comment: Performed at [facility] [address], [place], [place] [address]  Basic metabolic panel     Status: Abnormal   Collection Time: 12/27/21  1:23 AM  Result Value Ref Range   Sodium 133 (L) 135 - 145 mmol/L   Potassium 4.7 3.5 - 5.1 mmol/L   Chloride 98 98 - 111 mmol/L   CO2 25 22 - 32 mmol/L   Glucose, Bld 192 (H) 70 - 99 mg/dL    Comment: Glucose reference range applies only to samples taken after fasting for at least 8 hours.   BUN 91 (H) 8 - 23 mg/dL   Creatinine, Ser 3.79 (H) 0.61 - 1.24 mg/dL   Calcium 8.7 (L) 8.9 - 10.3 mg/dL   GFR, Estimated 15 (L) >60 mL/min    Comment: (NOTE) Calculated using the CKD-EPI Creatinine Equation (2021)    Anion gap 10 5 - 15    Comment: Performed at [facility] [address], [place], [place] [address]  Glucose, capillary     Status: Abnormal   Collection Time: 12/27/21  6:13 AM  Result Value Ref Range   Glucose-Capillary 195 (H) 70 - 99 mg/dL    Comment: Glucose reference range applies only to samples taken after fasting for at least 8 hours.   Comment 1 Notify RN    Comment 2 Document in Chart   Glucose, capillary     Status: Abnormal   Collection Time: 12/27/21 11:04 AM  Result Value Ref Range   Glucose-Capillary 234 (H) 70 - 99 mg/dL    Comment: Glucose reference range applies only to samples taken after fasting for at least 8 hours.     ROS: A comprehensive ROS was performed and all were negative.  Physical Exam: Vitals:   12/27/21 0741 12/27/21 1101  BP: 140/75 137/71  Pulse: 61 60  Resp: 19  18  Temp: 97.6 F (36.4 C) 97.6 F (36.4 C)  SpO2:       General exam: Pleasant male lying in bed, AAOx3 HEENT: No JVD CVS: S1-S2, irregular rhythm Lungs: Trace rales Abdomen: Soft, nontender, bowel sounds present Extremities: no edema  Skin: No rashes Psychiatry: Judgement and insight appear normal. Mood & affect appropriate.   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 May have a cardiorenal component as well; recommend avoiding contrast exposure. Delay non-urgent cardiac cath for now.   -No urgent need for dialysis  -Baseline Cr around 2; Cr currently 3.79; BUN:Cr of 24 -Avoids NSAIDs, nephrotoxins -Monitor UOP, BMP  2. Hypertension/volume  -  -Appears euvolemic on exam. Hold diuretics while AKI improves. -Continue HTN management with Coreg  4. Anemia  - Mild normocytic anemia. Could consider iron panel for further workup.   I have seen and examined this patient and agree with the plan of care  .  Episode of acute on chronic kidney disease.  Baseline creatinine around about 2 mg/dL.  Slow increase in serum creatinine since admission to the hospital with no i identifiable nephrotoxins.  We shall continue to follow on a daily basis.  Bladder outlet obstruction possible.  SGLT2 inhibitor discontinued on admission.  No evidence of any hemodynamic instability.  Continues on a heparin drip for NSTEMI.  DATON SZILAGYI 12/27/2021, 3:22 PM    Abelardo Diesel Penninger 12/27/2021, 11:58 AM

## 2021-12-27 NOTE — Care Management Important Message (Signed)
Important Message ? ?Patient Details  ?Name: John [name] ?MRN: [id number] ?Date of Birth: 1941/04/03 ? ? ?Medicare Important Message Given:  Yes ? ? ? ? ?[name] ?12/27/2021, 8:48 AM ?

## 2021-12-27 NOTE — TOC Benefit Eligibility Note (Signed)
Patient Advocate Encounter ?  ?Insurance verification completed.   ?  ?The patient is currently admitted and upon discharge could be taking ELIQUIS. ?  ?The current 30 day co-pay is, $163.74.  ? ?The patient is insured through [facility]. ? ? ?  ? ?

## 2021-12-27 NOTE — Progress Notes (Signed)
ANTICOAGULATION CONSULT NOTE ? ?Pharmacy Consult for IV heparin ?Indication: chest pain/ACS ? ?Allergies  ?Allergen Reactions  ? Brilinta [Ticagrelor] Shortness Of Breath  ? Penicillins Hives  ? Ezetimibe-Simvastatin Other (See Comments)  ?  Myalgia ?  ? ? ?Patient Measurements: ?Height: 5\' 6"  (167.6 cm) ?Weight: 90.1 kg (198 lb 11.2 oz) ?IBW/kg (Calculated) : 63.8 ?Heparin Dosing Weight: 82.6 kg ? ?Vital Signs: ?Temp: 97.6 ?F (36.4 ?C) (03/08 1101) ?Temp Source: Oral (03/08 1101) ?BP: 137/71 (03/08 1101) ?Pulse Rate: 60 (03/08 1101) ? ?Labs: ?Recent Labs  ?  12/24/21 ?1433 12/25/21 ?0122 12/25/21 ?0122 12/25/21 ?1052 12/25/21 ?2001 12/26/21 ?0559 12/26/21 ?1433 12/26/21 ?2051 12/27/21 ?0123 12/27/21 ?1147  ?HGB  --  12.4*   < >  --   --  11.7*  --   --  10.7*  --   ?HCT  --  38.4*  --   --   --  36.6*  --   --  34.3*  --   ?PLT  --  133*  --   --   --  133*  --   --  126*  --   ?LABPROT  --  14.2  --   --   --   --   --   --   --   --   ?INR  --  1.1  --   --   --   --   --   --   --   --   ?HEPARINUNFRC  --  <0.10*   < > 0.26*   < > 0.85*   < > 0.75* 0.79* 1.06*  ?CREATININE  --  3.37*   < > 3.54*  --  3.55*  --   --  3.79*  --   ?TROPONINIHS >24,000*  --   --   --   --   --   --   --   --   --   ? < > = values in this interval not displayed.  ? ? ? ?Estimated Creatinine Clearance: 15.8 mL/min (A) (by C-G formula based on SCr of 3.79 mg/dL (H)). ? ? ?Assessment: ?40 YOM presenting with chest pain, found to have an NSTEMI and started on IV heparin. Plan is for cath if renal function stabilizes, pharmacy to dose heparin.  ? ?Heparin level supratherapeutic on check. Hgb and platelets remain stable. ? ?Goal of Therapy:  ?Heparin level 0.3-0.7 units/ml ?Monitor platelets by anticoagulation protocol: Yes ?  ?Plan:  ?Hold heparin IV gtt x 1 hour ?Restart IV heparin gtt at 1100 units/h ?Recheck level in 8 hours ?Daily heparin level, CBC ?Monitor for signs/symptoms of bleeding ?Plan to stop IV heparin AM tomorrow, 3/9 ?F/u  plans for oral [facility] after cath ? ?Thank you for involving pharmacy in this patient's care. ? ?Elita Quick, PharmD ?PGY1 Ambulatory Care Pharmacy Resident ?12/27/2021 12:23 PM ? ?**Pharmacist phone directory can be found on [email or web address] listed under [place]** ?

## 2021-12-27 NOTE — Plan of Care (Signed)
?  Problem: Health Behavior/Discharge Planning: ?Goal: Ability to manage health-related needs will improve ?Outcome: Progressing ?  ?Problem: Clinical Measurements: ?Goal: Ability to maintain clinical measurements within normal limits will improve ?Outcome: Progressing ?Goal: Diagnostic test results will improve ?Outcome: Progressing ?Goal: Cardiovascular complication will be avoided ?Outcome: Progressing ?  ?

## 2021-12-27 NOTE — Telephone Encounter (Signed)
12/27/2021  Forms brought in by patients daughter to be completed by Dr.Crenshaw. Forms put in Dr.Crenshaw box.  Release, Billing, and Payment COMPLETE

## 2021-12-27 NOTE — Progress Notes (Signed)
RT placed patient on CPAP HS. 4L O2 Bleed in needed. Patient tolerating well at this time. ?

## 2021-12-27 NOTE — Progress Notes (Signed)
ANTICOAGULATION CONSULT NOTE ? ?Pharmacy Consult for IV heparin ?Indication: chest pain/ACS ? ?Allergies  ?Allergen Reactions  ? Brilinta [Ticagrelor] Shortness Of Breath  ? Penicillins Hives  ? Ezetimibe-Simvastatin Other (See Comments)  ?  Myalgia ?  ? ? ?Patient Measurements: ?Height: 5\' 6"  (167.6 cm) ?Weight: 90.1 kg (198 lb 11.2 oz) ?IBW/kg (Calculated) : 63.8 ?Heparin Dosing Weight: 82.6 kg ? ?Vital Signs: ?Temp: 98.1 ?F (36.7 ?C) (03/08 2023) ?Temp Source: Oral (03/08 2023) ?BP: 152/82 (03/08 2023) ?Pulse Rate: 76 (03/08 2023) ? ?Labs: ?Recent Labs  ?  12/25/21 ?0122 12/25/21 ?1052 12/25/21 ?2001 12/26/21 ?0559 12/26/21 ?1433 12/27/21 ?0123 12/27/21 ?1147 12/27/21 ?2204  ?HGB 12.4*  --   --  11.7*  --  10.7*  --   --   ?HCT 38.4*  --   --  36.6*  --  34.3*  --   --   ?PLT 133*  --   --  133*  --  126*  --   --   ?LABPROT 14.2  --   --   --   --   --   --   --   ?INR 1.1  --   --   --   --   --   --   --   ?HEPARINUNFRC <0.10* 0.26*   < > 0.85*   < > 0.79* 1.06* 0.35  ?CREATININE 3.37* 3.54*  --  3.55*  --  3.79*  --   --   ? < > = values in this interval not displayed.  ? ? ? ?Estimated Creatinine Clearance: 15.8 mL/min (A) (by C-G formula based on SCr of 3.79 mg/dL (H)). ? ? ?Assessment: ?13 YOM presenting with chest pain, found to have an NSTEMI and started on IV heparin. Plan is for cath if renal function stabilizes, pharmacy to dose heparin.  ? ?Heparin level supratherapeutic on check. Hgb and platelets remain stable. ? ?Heparin level came back therapeutic tonight. We will continue with the same rate until noon 3/9.  ?Goal of Therapy:  ?Heparin level 0.3-0.7 units/ml ?Monitor platelets by anticoagulation protocol: Yes ?  ?Plan:  ?Cont IV heparin gtt at 1100 units/h ?Daily heparin level, CBC ?Monitor for signs/symptoms of bleeding ?Plan to stop IV heparin AM tomorrow, 3/9 ?F/u plans for oral [facility] after cath ? ?Onnie Boer, PharmD, BCIDP, AAHIVP, CPP ?Infectious Disease Pharmacist ?12/27/2021 10:41 PM ? ? ?

## 2021-12-27 NOTE — Progress Notes (Addendum)
Progress Note  Patient Name: John [name] Date of Encounter: 12/27/2021  Primary Cardiologist: Kirk Ruths MD  Subjective   Continues to deny chest pain and feels his breathing has improved.   Inpatient Medications    Scheduled Meds:  [START ON 12/28/2021] allopurinol  100 mg Oral Daily   aspirin  81 mg Oral Daily   carvedilol  12.5 mg Oral BID WC   Chlorhexidine Gluconate Cloth  6 each Topical Daily   cholecalciferol  1,000 Units Oral Daily   clopidogrel  300 mg Oral Once   folic acid  1 mg Oral Daily   [START ON 12/28/2021] gabapentin  300 mg Oral QHS   hydrALAZINE  10 mg Oral BID   insulin aspart  0-20 Units Subcutaneous TID WC   [START ON 12/28/2021] insulin glargine-yfgn  50 Units Subcutaneous Daily   isosorbide dinitrate  10 mg Oral BID   multivitamin with minerals  1 tablet Oral Daily   pantoprazole  40 mg Oral Daily   predniSONE  50 mg Oral Q breakfast   rosuvastatin  20 mg Oral QPM   sertraline  25 mg Oral QPM   sodium chloride flush  3 mL Intravenous Q12H   sodium chloride flush  3 mL Intravenous Q12H   tamsulosin  0.4 mg Oral QPM   thiamine  100 mg Oral Daily   Or   thiamine  100 mg Intravenous Daily   Continuous Infusions:  sodium chloride Stopped (12/24/21 1700)   heparin     levofloxacin (LEVAQUIN) IV 500 mg (12/26/21 1150)   PRN Meds: sodium chloride, acetaminophen **OR** acetaminophen, bisacodyl, hydrALAZINE, HYDROmorphone (DILAUDID) injection, ipratropium, levalbuterol, LORazepam **OR** LORazepam, ondansetron **OR** ondansetron (ZOFRAN) IV, oxyCODONE, senna-docusate, traZODone   Vital Signs    Vitals:   12/26/21 2351 12/27/21 0432 12/27/21 0741 12/27/21 1101  BP:  128/77 140/75 137/71  Pulse: 60 60 61 60  Resp: 19 16 19 18   Temp:  97.6 F (36.4 C) 97.6 F (36.4 C) 97.6 F (36.4 C)  TempSrc:  Oral Oral Oral  SpO2: 100% 94%    Weight:  90.1 kg    Height:        Intake/Output Summary (Last 24 hours) at 12/27/2021 1329 Last data filed at  12/27/2021 0900 Gross per 24 hour  Intake 856.17 ml  Output 900 ml  Net -43.83 ml     I/O since admission:   Filed Weights   12/25/21 0521 12/26/21 0432 12/27/21 0432  Weight: 91.8 kg 91.8 kg 90.1 kg    Telemetry    Few PVCs, NSR- Personally Reviewed  ECG    ECG (independently read by me): AM EKG pending  Physical Exam    BP 137/71 (BP Location: Left Arm)    Pulse 60    Temp 97.6 F (36.4 C) (Oral)    Resp 18    Ht 5\' 6"  (1.676 m)    Wt 90.1 kg    SpO2 94%    BMI 32.07 kg/m  Physical Exam  Constitutional: Well-developed, well-nourished, and in no distress.  HENT:  Head: Normocephalic and atraumatic.  Eyes: EOM are normal.  Neck: Normal range of motion.  Cardiovascular: Normal rate, regular rhythm, intact distal pulses. No gallop and no friction rub.  No murmur heard. No lower extremity edema  Pulmonary: Non labored breathing on room air, no wheezing or rales  Abdominal: Soft. Normal bowel sounds. minimally distended and non tender Musculoskeletal: Normal range of motion.  General: No tenderness or edema.  Neurological: Alert and oriented to person, place, and time. Non focal  Skin: Skin is warm and dry.     Labs    Chemistry Recent Labs  Lab 12/24/21 0419 12/25/21 0122 12/25/21 1052 12/26/21 0559 12/27/21 0123  NA 137   < > 135 135 133*  K 4.2   < > 5.0 4.9 4.7  CL 101   < > 99 97* 98  CO2 26   < > 23 23 25   GLUCOSE 263*   < > 196* 234* 192*  BUN 38*   < > 57* 77* 91*  CREATININE 2.52*   < > 3.54* 3.55* 3.79*  CALCIUM 9.0   < > 8.7* 9.0 8.7*  PROT 8.4*  --   --   --   --   ALBUMIN 4.2  --   --  3.3*  --   AST 32  --   --   --   --   ALT 25  --   --   --   --   ALKPHOS 74  --   --   --   --   BILITOT 0.9  --   --   --   --   GFRNONAA 25*   < > 16* 16* 15*  ANIONGAP 10   < > 13 15 10    < > = values in this interval not displayed.      Hematology Recent Labs  Lab 12/25/21 0122 12/26/21 0559 12/27/21 0123  WBC 11.9* 10.5 11.2*  RBC  4.20* 4.03* 3.76*  HGB 12.4* 11.7* 10.7*  HCT 38.4* 36.6* 34.3*  MCV 91.4 90.8 91.2  MCH 29.5 29.0 28.5  MCHC 32.3 32.0 31.2  RDW 15.8* 15.6* 15.5  PLT 133* 133* 126*     Cardiac Enzymes Component Ref Range & Units 1 d ago (12/24/21) 1 d ago (12/24/21) 1 d ago (12/24/21) 3 mo ago (09/13/21) 3 mo ago (09/13/21) 3 mo ago (09/13/21) 3 mo ago (09/13/21)  Troponin I (High Sensitivity) <18 ng/L >24,000 High Panic   313 High Panic  CM  39 High  CM  484 High Panic  CM  489 High Panic  CM  82 High  CM  34 High  CM    BNP Recent Labs  Lab 12/25/21 0122 12/26/21 0559 12/27/21 0123  BNP 1,009.5* 885.5* 649.9*      DDimer  Recent Labs  Lab 12/24/21 0419  DDIMER 2.19*      Lipid Panel     Component Value Date/Time   CHOL 99 12/24/2021 2008   TRIG 95 12/24/2021 2008   HDL 51 12/24/2021 2008   CHOLHDL 1.9 12/24/2021 2008   VLDL 19 12/24/2021 2008   [place] 29 12/24/2021 2008      Radiology    US RENAL  Result Date: 12/26/2021 CLINICAL DATA:  Acute renal injury EXAM: RENAL / URINARY TRACT ULTRASOUND COMPLETE COMPARISON:  12/24/2021 CT FINDINGS: Right Kidney: Renal measurements: 11.3 x 6.0 x 5.0 cm. = volume: 77 mL. 7 cm exophytic lower pole cyst is noted similar to that seen on prior CT examination. Mild increased echogenicity is noted. Left Kidney: Renal measurements: 11.3 x 4.9 x 5.0 cm. = volume: 146 mL. 4.8 cm mid left renal cyst is noted which appears simple in nature. Mild increased echogenicity is noted. Bladder: Compressed by Foley catheter. Other: None. IMPRESSION: Bilateral renal cysts similar to that seen on recent CT examination. No acute abnormality noted. Mild  increased echogenicity consistent with medical renal disease. Electronically Signed   By: Inez Catalina M.D.   On: 12/26/2021 20:07   ECHOCARDIOGRAM COMPLETE  Result Date: 12/25/2021    ECHOCARDIOGRAM REPORT   Patient Name:   John [name] Date of Exam: 12/25/2021 Medical Rec #:  [id number]     Height:       66.0  in Accession #:    [id number]    Weight:       202.4 lb Date of Birth:  1941/09/01      BSA:          2.010 m Patient Age:    81 years      BP:           122/67 mmHg Patient Gender: M             HR:           85 bpm. Exam Location:  Inpatient Procedure: 2D Echo, Cardiac Doppler, Color Doppler and Intracardiac            Opacification Agent Indications:    NSTEMI  History:        Patient has prior history of Echocardiogram examinations. CHF,                 CAD; Risk Factors:Hypertension and Sleep Apnea.  Sonographer:    Jyl Heinz Referring Phys: [phone] SEYED A SHAHMEHDI IMPRESSIONS  1. Left ventricular ejection fraction, by estimation, is 35%. The left ventricle has moderately decreased function. The left ventricle demonstrates global hypokinesis. The left ventricular internal cavity size was mildly dilated. There is mild left ventricular hypertrophy. Left ventricular diastolic parameters are consistent with Grade II diastolic dysfunction (pseudonormalization).  2. Right ventricular systolic function is normal. The right ventricular size is normal. Tricuspid regurgitation signal is inadequate for assessing PA pressure.  3. The mitral valve is normal in structure. Trivial mitral valve regurgitation. No evidence of mitral stenosis.  4. The aortic valve is tricuspid. There is mild calcification of the aortic valve. Aortic valve regurgitation is not visualized. No aortic stenosis is present. FINDINGS  Left Ventricle: Left ventricular ejection fraction, by estimation, is 35%. The left ventricle has moderately decreased function. The left ventricle demonstrates global hypokinesis. The left ventricular internal cavity size was mildly dilated. There is mild left ventricular hypertrophy. Left ventricular diastolic parameters are consistent with Grade II diastolic dysfunction (pseudonormalization). Right Ventricle: The right ventricular size is normal. No increase in right ventricular wall thickness. Right ventricular  systolic function is normal. Tricuspid regurgitation signal is inadequate for assessing PA pressure. Left Atrium: Left atrial size was normal in size. Right Atrium: Right atrial size was normal in size. Pericardium: Trivial pericardial effusion is present. Mitral Valve: The mitral valve is normal in structure. There is mild calcification of the mitral valve leaflet(s). Mild mitral annular calcification. Trivial mitral valve regurgitation. No evidence of mitral valve stenosis. Tricuspid Valve: The tricuspid valve is normal in structure. Tricuspid valve regurgitation is not demonstrated. Aortic Valve: The aortic valve is tricuspid. There is mild calcification of the aortic valve. Aortic valve regurgitation is not visualized. No aortic stenosis is present. Aortic valve peak gradient measures 8.1 mmHg. Pulmonic Valve: The pulmonic valve was normal in structure. Pulmonic valve regurgitation is not visualized. Aorta: The aortic root is normal in size and structure. IAS/Shunts: No atrial level shunt detected by color flow Doppler.  LEFT VENTRICLE PLAX 2D LVIDd:         5.60 cm  Diastology LVIDs:         4.20 cm      LV e' medial:    3.92 cm/s LV PW:         1.20 cm      LV E/e' medial:  26.0 LV IVS:        1.20 cm      LV e' lateral:   4.68 cm/s LVOT diam:     2.00 cm      LV E/e' lateral: 21.8 LV SV:         52 LV SV Index:   26 LVOT Area:     3.14 cm  LV Volumes (MOD) LV vol d, MOD A2C: 141.0 ml LV vol d, MOD A4C: 121.0 ml LV vol s, MOD A2C: 85.3 ml LV vol s, MOD A4C: 68.1 ml LV SV MOD A2C:     55.7 ml LV SV MOD A4C:     121.0 ml LV SV MOD BP:      56.8 ml RIGHT VENTRICLE RV Basal diam:  2.60 cm RV Mid diam:    1.70 cm RV S prime:     10.90 cm/s TAPSE (M-mode): 2.4 cm LEFT ATRIUM             Index        RIGHT ATRIUM           Index LA diam:        4.40 cm 2.19 cm/m   RA Area:     13.50 cm LA Vol (A2C):   54.5 ml 27.12 ml/m  RA Volume:   28.60 ml  14.23 ml/m LA Vol (A4C):   43.1 ml 21.44 ml/m LA Biplane Vol:  49.5 ml 24.63 ml/m  AORTIC VALVE AV Area (Vmax): 1.81 cm AV Vmax:        142.00 cm/s AV Peak Grad:   8.1 mmHg LVOT Vmax:      81.60 cm/s LVOT Vmean:     63.600 cm/s LVOT VTI:       0.165 m  AORTA Ao Root diam: 3.10 cm Ao Asc diam:  3.00 cm MITRAL VALVE MV Area (PHT): 4.77 cm     SHUNTS MV Decel Time: 159 msec     Systemic VTI:  0.16 m MV E velocity: 102.00 cm/s  Systemic Diam: 2.00 cm MV A velocity: 72.00 cm/s MV E/A ratio:  1.42 Dalton McleanMD Electronically signed by Franki Monte Signature Date/Time: 12/25/2021/5:52:41 PM    Final     Cardiac Studies   TTE 09/14/21: EF estimated 40 to 45% with mildly decreased function.  Moderate LVH.  GRII DD-with mildly dilated left atrium.  Normal RV size and function.  Unable to assess RVP.  Normal RAP. Normal aortic and mitral valve.    TTE 12/25/2021: EF~35% with moderately reduced function.  Global HK.  Mild LV dilation.  Mild LVH.  GRII DD.  Normal RV function, but unable to assess RVP.  Mild aortic valve calcification but no stenosis.  Patient Profile     81 y.o. 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 He was subsequently transferred  to [facility] for evaluation for LHC.  => Unfortunately, worsening renal function this hospitalization has precluded invasive ischemic evaluation.   Assessment & Plan    #Type I NSTEMI  Patient presented with acute onset chest pain, SOB, and diaphoresis and found to have markedly elevated troponins >24,000.  CT chest performed in the ED also revealed significant multivessel CAD involving the left main coronary as well. Patient was transferred to [facility] for evaluation for LHC.  Echo 3/6 shows worsening EF compared with 05/2021 Echo. Given patient's AKI (apparent BL ~2, sCr 3.79)  in the setting of diuresis will continue to hold on catheterization. In addition, patient remains asymptomatic.  -Continue heparin gtt for one more day =- 72 hrs would be later this PM -- would continue overnight & d/c in AM -> start Plavix  -Discontinue nitroglycerin gtt and transition to oral isordil 10mg  BID -Will also add oral hydralazine 10mg  BID, since patient cannot be on ACE/ARB -Continue Coreg 12.5mg  BID  -Continue to hold ACE/ARB given AKI  -Continue crestor 20mg  qd, LDL 29 -Hold on catheterization at this time given renal function, will likely need evaluation in the outpatient setting once renal function normalizes   #Acute on Chronic HFrEF Most recent EF 35%. He continues to be euvolemic on exam. He is also on room air this morning. O2 to maintain his oxygen saturations at ~94%. Making adequate urine per chart. Will continue to hold on further diuresis. BUN also worsened. -Daily weights  -Strict I's and O's  Renal function seems to have plateaued.  Hopefully this will begin his return of function.  Need to monitor for post ATN diuresis  -ACE/ARB, aldactone held due to renal function  #New Onset Atrial Fibrillation Noted to go in and out of atrial fibrillation while in the ED with his atrial fibrillation captured on EKG. Telemetry reviewed this AM with patient mostly being in NSR. pAFib is likely secondary to his acute MI and hypoxia/hypercapnia. CHA2DS2-VASc score is 6. He is currently on heparin gtt for NSTEMI.  -Continue telemetry -Continue heparin can transition to [place] in the AM. -Rate control with Coreg => if there is significant recurrence, would have low threshold to consider chemical cardioversion with amiodarone, to avoid exacerbation CHF. -TTE as above indicating reduced EF from baseline.  #PVD  s/p R fem-bk-pop in 2019 - with NSTEMI presentation - convert ASA to  Plavix  - Continue crestor   #AKI on CKD stage 3 BL sCr ~2 now 3.79. Thought to be due to cardiorenal syndrome initially given pulmonary edema on exam. Patient was diuresed with worsening of his sCr.  Appears to be euvolemic and is now on room air.  Renal U/S c/w some degree of medicorenal disease. Urinalysis yesterday with HC and granular casts suggesting patient may be intravascularly down.  -Will continue to hold lasix -Would consider gentle IV hydration, and urine studies to further assess patient's AKI   #Acute Hypoxic and Hypercapneic Respiratory Failure #Acute Pulmonary Edema #Possible CAP  Likely 2/2 pulmonary edema. Briefly tolerated BiPAP now breathing without distress on RA. -Aggressive pulm toilet  -On levaquin for possible PNA  #BPH -continue home flomax, has foley catheter in place. Per primary.   Rick Duff, MD PGY-2 Internal Medicine  Pager [phone] 12/27/2021, 1:29 PM     ATTENDING ATTESTATION  I have seen, examined and evaluated the patient this AM on rounds along with Dr. Eulas Post.  After reviewing all the available data and chart, we discussed the patients laboratory, study &  physical findings as well as symptoms in detail. I agree with his findings, examination as well as impression recommendations as per our discussion.    Attending adjustments noted in italics.   Unfortunately, kidney function continues to worsen.  This essentially will preclude [place] from considering cardiac catheterization.  As such we are simply treating his myocardial infarction medically.  He is now off of oxygen and breathing well.  No rales on exam. He is due to complete 72 hours of IV heparin this evening.  With reduced EF, will need to add afterload reduction.   Thankfully, he is making urine so we would anticipate some recovery of renal function.  At this point, he is well outside of the 48-hour window from his cardiac event -- essentially a completed infarct (with Troponin > 24 K  - suspect major vessel occlusion).  With this being unable to take him to the Cath Lab due to worsening renal function, recommend delayed ischemic evaluation once renal function stabilizes-likely in the outpatient setting..  Cardiac recommendations going forward: Plan to discontinue heparin tomorrow morning,  Will Load with clopidogrel tonight 300 mg and 75 mg daily starting tomorrow. DC IV NTG today and convert to Isordil 10 mg twice daily in addition to hydralazine 10 mg twice daily for afterload reduction as ARB/Arni/ACE I or contraindicated with his progressively worsening renal function. Continue to hold diuretic. Perhaps once renal function returns to baseline, we can consider Ischemic Evaluation - (potentially non-invasive with Stress MRI - which allows evaluation of viability & ischemia as well as function)     Will follow.     Glenetta Hew, M.D., M.S. Interventional Cardiologist   Pager # [phone] Phone # [phone] [address] [place], [place] [address]

## 2021-12-27 NOTE — Progress Notes (Addendum)
? ?  Brief cardiology note: Full note to follow ? ?Patient seen and examined the day.  Unfortunately, kidney function continues to worsen.  This would probably preclude [place] from considering cardiac catheterization.  As such we are simply treating his myocardial infarction medically.  He is now off of oxygen and breathing well.  No rales on exam. ?He is due to complete 72 hours of IV heparin this evening. ? ?With reduced EF, will need to add afterload reduction. ? ?Cardiac recommendations going forward: ?Plan to discontinue heparin tomorrow morning, but will load with clopidogrel tonight 300 mg and 7\5 mg daily starting tomorrow. ?DC IV NTG today and convert to Isordil 10 mg twice daily in addition to hydralazine 10 mg twice daily for afterload reduction as ARB/Arni/ACE I or contraindicated with his progressively worsening renal function. ? ?Continue to hold diuretic. ? ?Thankfully, he is making urine so we would anticipate some recovery of renal function. ?At this point, he is well outside of the 48-hour window from his cardiac event.  With this being unable to take him to the Cath Lab due to worsening renal function, recommend delayed ischemic evaluation once renal function stabilizes-likely in the outpatient setting.. ? ? ? ?Glenetta Hew, MD ? ? ?

## 2021-12-27 NOTE — Progress Notes (Signed)
PROGRESS NOTE    John [name]  WNI:[id number] DOB: 05-Nov-1940 DOA: 12/24/2021 PCP: [name], DO   Brief Narrative:  John [name] is a 81 yo M with PMH/o DMII, OSA, dCHF, BPH, CAD, HTN, HLD CVA, PVD presented with shortness of breath.  Symptoms appear to be acute onset only a few hours prior to admission, requiring upwards of 15 L nonrebreather with sats in the low 90s with questionable cough but denies fevers chills or chest pain.    Assessment & Plan:   Principal Problem:   NSTEMI (non-ST elevated myocardial infarction) ([place]) Active Problems:   Acute exacerbation of CHF (congestive heart failure) (HCC)   Acute respiratory failure with hypoxia and hypercapnia (HCC)   Hypoxia   Essential hypertension, malignant   D-dimer, elevated   CAD (coronary artery disease) of artery bypass graft   Cardiomyopathy, ischemic   Mixed hyperlipidemia   Peripheral vascular disease (HCC)   Coronary atherosclerosis of native coronary artery   Elevated troponin   Obstructive sleep apnea   BPH (benign prostatic hyperplasia)   CKD (chronic kidney disease), stage III ([place])   Diabetic neuropathy (HCC)  NSTEMI (non-ST elevated myocardial infarction) [facility]) Cardiology following, evaluation limited by AKI as below Discontinue nitro -start Isordil and hydralazine Plan to discontinue heparin 12/28/2021 Will follow outpatient for ischemic evaluation and cath 3/7  Echo with EF 35% with global hypokinesis.  Grade 2 diastolic dysfunction.  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 Follow CIWA protocol   D-dimer, elevated - Likely acute phase reactant - V/Q negative - Shortness with hypoxia most likely due to CHF exacerbation, congestion, pneumonia, - We will monitor closely   Diabetic neuropathy (HCC) - Stable currently not on any medication - Strict glycemic control  BPH (benign prostatic hyperplasia) - Monitoring, PSA - Continue Flomax  Obstructive sleep apnea - Noncompliant with CPAP at home    Peripheral vascular disease (HCC) - Continue aspirin, continue statins   Mixed hyperlipidemia - Continue Crestor  DVT prophylaxis: Heparin drip Code Status: Full Family Communication: At bedside  Status is: Inpatient  Dispo: The patient is from: Home              Anticipated d/c is to: To be determined, likely SNF              Anticipated d/c date is: 40 to 72 hours pending clinical course and renal recovery              Patient currently not medically stable for discharge  Consultants:  Cardiology, nephrology  Procedures:  None  Antimicrobials:  None  Subjective: Acute issues or events overnight  Objective: Vitals:   12/26/21 2317 12/26/21 2351 12/27/21 0432 12/27/21 0741  BP: (!) 110/51  128/77 140/75  Pulse: 60 60 60 61  Resp: 15 19 16 19   Temp: 97.6 F (36.4 C)  97.6 F (36.4 C) 97.6 F (36.4 C)  TempSrc: Oral  Oral Oral  SpO2: 100% 100% 94%   Weight:   90.1 kg   Height:        Intake/Output Summary (Last 24 hours) at 12/27/2021 0743 Last data filed at 12/27/2021 7035 Gross per 24 hour  Intake 616.17 ml  Output 1200 ml  Net  -583.83 ml   Filed Weights   12/25/21 0521 12/26/21 0432 12/27/21 0432  Weight: 91.8 kg 91.8 kg 90.1 kg    Examination:  General:  Pleasantly resting in bed, No acute distress. HEENT:  Normocephalic atraumatic.  Sclerae nonicteric, noninjected.  Extraocular movements intact bilaterally. Neck:  Without mass or deformity.  Trachea is midline. Lungs:  Clear to auscultate bilaterally without rhonchi, wheeze, or rales. Heart:  Regular rate and rhythm.  Without murmurs, rubs, or gallops. Abdomen:  Soft, nontender, nondistended.  Without guarding or rebound.  Data Reviewed: I have personally reviewed following labs and imaging studies  CBC: Recent Labs  Lab 12/24/21 0419 12/25/21 0122 12/26/21 0559 12/27/21 0123  WBC 13.3* 11.9* 10.5 11.2*  HGB 14.9 12.4* 11.7* 10.7*  HCT 48.2 38.4* 36.6* 34.3*  MCV 94.9 91.4 90.8 91.2  PLT 179 133* 133* 009*   Basic Metabolic Panel: Recent Labs  Lab 12/24/21 0419 12/24/21 2008 12/25/21 0122 12/25/21 1052 12/26/21 0559 12/26/21 0619 12/27/21 0123  NA 137  --  138 135 135  --   --   K 4.2  --  4.3 5.0 4.9  --   --   CL 101  --  103 99 97*  --   --   CO2 26  --  23 23 23   --   --   GLUCOSE 263*  --  75 196* 234*  --   --   BUN 38*  --  51* 57* 77*  --   --   CREATININE 2.52*  --  3.37* 3.54* 3.55*  --   --   CALCIUM 9.0  --  8.6* 8.7* 9.0  --   --   MG  --  2.2  --  2.2  --  2.3  --   PHOS  --   --   --   --  6.2*  --  5.5*   GFR: Estimated Creatinine Clearance: 16.9 mL/min (A) (by C-G formula based on SCr of 3.55 mg/dL (H)). Liver Function Tests: Recent Labs  Lab 12/24/21 0419 12/26/21 0559  AST 32  --   ALT 25  --   ALKPHOS 74  --   BILITOT 0.9  --   PROT 8.4*  --   ALBUMIN 4.2 3.3*   No results for input(s): LIPASE, AMYLASE in the last 168 hours. No results for input(s): AMMONIA in the last 168 hours. Coagulation Profile: Recent Labs  Lab 12/25/21 0122  INR 1.1   Cardiac Enzymes: No results for input(s): CKTOTAL,  CKMB, CKMBINDEX, TROPONINI in the last 168 hours. BNP (last 3 results) No results for input(s): PROBNP in the last 8760 hours. HbA1C: Recent Labs    12/24/21 0746  HGBA1C 7.1*   CBG: Recent Labs  Lab 12/26/21 0614 12/26/21 1237 12/26/21 1651 12/26/21 2113 12/27/21 0613  GLUCAP 240* 265* 285* 247* 195*   Lipid Profile: Recent Labs    12/24/21 2008  CHOL 99  HDL 51  LDLCALC 29  John 95  CHOLHDL  1.9   Thyroid Function Tests: Recent Labs    12/24/21 2008  TSH 2.221   Anemia Panel: No results for input(s): VITAMINB12, FOLATE, FERRITIN, TIBC, IRON, RETICCTPCT in the last 72 hours. Sepsis Labs: Recent Labs  Lab 12/24/21 0419 12/24/21 0635  LATICACIDVEN 2.1* 1.0    Recent Results (from the past 240 hour(s))  Resp Panel by RT-PCR (Flu A&B, Covid) Nasopharyngeal Swab     Status: None   Collection Time: 12/24/21  4:27 AM   Specimen: Nasopharyngeal Swab; Nasopharyngeal(NP) swabs in vial transport medium  Result Value Ref Range Status   SARS Coronavirus 2 by RT PCR NEGATIVE NEGATIVE Final    Comment: (NOTE) SARS-CoV-2 target nucleic acids are NOT DETECTED.  The SARS-CoV-2 RNA is generally detectable in upper respiratory specimens during the acute phase of infection. The lowest concentration of SARS-CoV-2 viral copies this assay can detect is 138 copies/mL. A negative result does not preclude SARS-Cov-2 infection and should not be used as the sole basis for treatment or other patient management decisions. A negative result may occur with  improper specimen collection/handling, submission of specimen other than nasopharyngeal swab, presence of viral mutation(s) within the areas targeted by this assay, and inadequate number of viral copies(<138 copies/mL). A negative result must be combined with clinical observations, patient history, and epidemiological information. The expected result is Negative.  Fact Sheet for Patients:   EntrepreneurPulse.com.au  Fact Sheet for Healthcare Providers:  IncredibleEmployment.be  This test is no t yet approved or cleared by the [place] FDA and  has been authorized for detection and/or diagnosis of SARS-CoV-2 by FDA under an Emergency Use Authorization (EUA). This EUA will remain  in effect (meaning this test can be used) for the duration of the COVID-19 declaration under Section 564(b)(1) of the Act, 21 U.S.C.section 360bbb-3(b)(1), unless the authorization is terminated  or revoked sooner.       Influenza A by PCR NEGATIVE NEGATIVE Final   Influenza B by PCR NEGATIVE NEGATIVE Final    Comment: (NOTE) The Xpert Xpress SARS-CoV-2/FLU/RSV plus assay is intended as an aid in the diagnosis of influenza from Nasopharyngeal swab specimens and should not be used as a sole basis for treatment. Nasal washings and aspirates are unacceptable for Xpert Xpress SARS-CoV-2/FLU/RSV testing.  Fact Sheet for Patients: EntrepreneurPulse.com.au  Fact Sheet for Healthcare Providers: IncredibleEmployment.be  This test is not yet approved or cleared by the [place] FDA and has been authorized for detection and/or diagnosis of SARS-CoV-2 by FDA under an Emergency Use Authorization (EUA). This EUA will remain in effect (meaning this test can be used) for the duration of the COVID-19 declaration under Section 564(b)(1) of the Act, 21 U.S.C. section 360bbb-3(b)(1), unless the authorization is terminated or revoked.  Performed at [facility], [address] [place], [place] [address]   Culture, blood (routine x 2)     Status: None (Preliminary result)   Collection Time: 12/24/21  8:05 AM   Specimen: Left Antecubital; Blood  Result Value Ref Range Status   Specimen Description   Final    LEFT ANTECUBITAL BOTTLES DRAWN AEROBIC AND ANAEROBIC   Special Requests Blood Culture adequate volume  Final   Culture    Final    NO GROWTH 2 DAYS Performed at [facility], [address] [place], [place] [address]    Report Status PENDING  Incomplete  Culture, blood (routine x 2)     Status: None (Preliminary result)   Collection Time: 12/24/21  8:07 AM  Specimen: BLOOD RIGHT HAND  Result Value Ref Range Status   Specimen Description   Final    BLOOD RIGHT HAND BOTTLES DRAWN AEROBIC AND ANAEROBIC   Special Requests Blood Culture adequate volume  Final   Culture   Final    NO GROWTH 2 DAYS Performed at [facility], [address] [place], [place] [address]    Report Status PENDING  Incomplete         Radiology Studies: US RENAL  Result Date: 12/26/2021 CLINICAL DATA:  Acute renal injury EXAM: RENAL / URINARY TRACT ULTRASOUND COMPLETE COMPARISON:  12/24/2021 CT FINDINGS: Right Kidney: Renal measurements: 11.3 x 6.0 x 5.0 cm. = volume: 77 mL. 7 cm exophytic lower pole cyst is noted similar to that seen on prior CT examination. Mild increased echogenicity is noted. Left Kidney: Renal measurements: 11.3 x 4.9 x 5.0 cm. = volume: 146 mL. 4.8 cm mid left renal cyst is noted which appears simple in nature. Mild increased echogenicity is noted. Bladder: Compressed by Foley catheter. Other: None. IMPRESSION: Bilateral renal cysts similar to that seen on recent CT examination. No acute abnormality noted. Mild increased echogenicity consistent with medical renal disease. Electronically Signed   By: Inez Catalina M.D.   On: 12/26/2021 20:07   ECHOCARDIOGRAM COMPLETE  Result Date: 12/25/2021    ECHOCARDIOGRAM REPORT   Patient Name:   NYSHAUN STANDAGE Date of Exam: 12/25/2021 Medical Rec #:  [id number]     Height:       66.0 in Accession #:    [id number]    Weight:       202.4 lb Date of Birth:  Oct 07, 1941      BSA:          2.010 m Patient Age:    80 years      BP:           122/67 mmHg Patient Gender: M             HR:           85 bpm. Exam Location:  Inpatient Procedure: 2D Echo, Cardiac Doppler, Color Doppler and  Intracardiac            Opacification Agent Indications:    NSTEMI  History:        Patient has prior history of Echocardiogram examinations. CHF,                 CAD; Risk Factors:Hypertension and Sleep Apnea.  Sonographer:    Jyl Heinz Referring Phys: [phone] SEYED A SHAHMEHDI IMPRESSIONS  1. Left ventricular ejection fraction, by estimation, is 35%. The left ventricle has moderately decreased function. The left ventricle demonstrates global hypokinesis. The left ventricular internal cavity size was mildly dilated. There is mild left ventricular hypertrophy. Left ventricular diastolic parameters are consistent with Grade II diastolic dysfunction (pseudonormalization).  2. Right ventricular systolic function is normal. The right ventricular size is normal. Tricuspid regurgitation signal is inadequate for assessing PA pressure.  3. The mitral valve is normal in structure. Trivial mitral valve regurgitation. No evidence of mitral stenosis.  4. The aortic valve is tricuspid. There is mild calcification of the aortic valve. Aortic valve regurgitation is not visualized. No aortic stenosis is present. FINDINGS  Left Ventricle: Left ventricular ejection fraction, by estimation, is 35%. The left ventricle has moderately decreased function. The left ventricle demonstrates global hypokinesis. The left ventricular internal cavity size was mildly dilated. There is mild left ventricular hypertrophy. Left ventricular diastolic parameters are consistent  with Grade II diastolic dysfunction (pseudonormalization). Right Ventricle: The right ventricular size is normal. No increase in right ventricular wall thickness. Right ventricular systolic function is normal. Tricuspid regurgitation signal is inadequate for assessing PA pressure. Left Atrium: Left atrial size was normal in size. Right Atrium: Right atrial size was normal in size. Pericardium: Trivial pericardial effusion is present. Mitral Valve: The mitral valve is normal in  structure. There is mild calcification of the mitral valve leaflet(s). Mild mitral annular calcification. Trivial mitral valve regurgitation. No evidence of mitral valve stenosis. Tricuspid Valve: The tricuspid valve is normal in structure. Tricuspid valve regurgitation is not demonstrated. Aortic Valve: The aortic valve is tricuspid. There is mild calcification of the aortic valve. Aortic valve regurgitation is not visualized. No aortic stenosis is present. Aortic valve peak gradient measures 8.1 mmHg. Pulmonic Valve: The pulmonic valve was normal in structure. Pulmonic valve regurgitation is not visualized. Aorta: The aortic root is normal in size and structure. IAS/Shunts: No atrial level shunt detected by color flow Doppler.  LEFT VENTRICLE PLAX 2D LVIDd:         5.60 cm      Diastology LVIDs:         4.20 cm      LV e' medial:    3.92 cm/s LV PW:         1.20 cm      LV E/e' medial:  26.0 LV IVS:        1.20 cm      LV e' lateral:   4.68 cm/s LVOT diam:     2.00 cm      LV E/e' lateral: 21.8 LV SV:         52 LV SV Index:   26 LVOT Area:     3.14 cm  LV Volumes (MOD) LV vol d, MOD A2C: 141.0 ml LV vol d, MOD A4C: 121.0 ml LV vol s, MOD A2C: 85.3 ml LV vol s, MOD A4C: 68.1 ml LV SV MOD A2C:     55.7 ml LV SV MOD A4C:     121.0 ml LV SV MOD BP:      56.8 ml RIGHT VENTRICLE RV Basal diam:  2.60 cm RV Mid diam:    1.70 cm RV S prime:     10.90 cm/s TAPSE (M-mode): 2.4 cm LEFT ATRIUM             Index        RIGHT ATRIUM           Index LA diam:        4.40 cm 2.19 cm/m   RA Area:     13.50 cm LA Vol (A2C):   54.5 ml 27.12 ml/m  RA Volume:   28.60 ml  14.23 ml/m LA Vol (A4C):   43.1 ml 21.44 ml/m LA Biplane Vol: 49.5 ml 24.63 ml/m  AORTIC VALVE AV Area (Vmax): 1.81 cm AV Vmax:        142.00 cm/s AV Peak Grad:   8.1 mmHg LVOT Vmax:      81.60 cm/s LVOT Vmean:     63.600 cm/s LVOT VTI:       0.165 m  AORTA Ao Root diam: 3.10 cm Ao Asc diam:  3.00 cm MITRAL VALVE MV Area (PHT): 4.77 cm     SHUNTS MV Decel  Time: 159 msec     Systemic VTI:  0.16 m MV E velocity: 102.00 cm/s  Systemic Diam: 2.00 cm MV A velocity:  72.00 cm/s MV E/A ratio:  1.42 John McleanMD Electronically signed by Franki Monte Signature Date/Time: 12/25/2021/5:52:41 PM    Final         Scheduled Meds:  allopurinol  200 mg Oral Daily   aspirin  81 mg Oral Daily   carvedilol  12.5 mg Oral BID WC   Chlorhexidine Gluconate Cloth  6 each Topical Daily   cholecalciferol  1,000 Units Oral Daily   folic acid  1 mg Oral Daily   gabapentin  300 mg Oral BID   insulin aspart  0-20 Units Subcutaneous TID WC   insulin glargine-yfgn  43 Units Subcutaneous Daily   multivitamin with minerals  1 tablet Oral Daily   pantoprazole  40 mg Oral Daily   predniSONE  50 mg Oral Q breakfast   rosuvastatin  20 mg Oral QPM   sertraline  25 mg Oral QPM   sodium chloride flush  3 mL Intravenous Q12H   sodium chloride flush  3 mL Intravenous Q12H   tamsulosin  0.4 mg Oral QPM   thiamine  100 mg Oral Daily   Or   thiamine  100 mg Intravenous Daily   Continuous Infusions:  sodium chloride Stopped (12/24/21 1700)   heparin 1,300 Units/hr (12/27/21 0347)   levofloxacin (LEVAQUIN) IV 500 mg (12/26/21 1150)   nitroGLYCERIN 5 mcg/min (12/25/21 0614)     LOS: 3 days   Time spent: 18min  Sharee Sturdy C Yves Fodor, DO [facility]  If 7PM-7AM, please contact night-coverage [email or web address]  12/27/2021, 7:43 AM

## 2021-12-27 NOTE — Progress Notes (Signed)
ANTICOAGULATION CONSULT NOTE ? ?Pharmacy Consult for IV heparin ?Indication: chest pain/ACS ? ?Allergies  ?Allergen Reactions  ? Brilinta [Ticagrelor] Shortness Of Breath  ? Penicillins Hives  ? Ezetimibe-Simvastatin Other (See Comments)  ?  Myalgia ?  ? ? ?Patient Measurements: ?Height: 5\' 6"  (167.6 cm) ?Weight: 91.8 kg (202 lb 6.1 oz) ?IBW/kg (Calculated) : 63.8 ?Heparin Dosing Weight: 82.6 kg ? ?Vital Signs: ?Temp: 97.6 ?F (36.4 ?C) (03/07 2317) ?Temp Source: Oral (03/07 2317) ?BP: 110/51 (03/07 2317) ?Pulse Rate: 60 (03/07 2351) ? ?Labs: ?Recent Labs  ?  12/24/21 ?0419 12/24/21 ?0419 12/24/21 ?0635 12/24/21 ?1433 12/25/21 ?0122 12/25/21 ?1052 12/25/21 ?2001 12/26/21 ?0559 12/26/21 ?1433 12/26/21 ?2051 12/27/21 ?0123  ?HGB 14.9  --   --   --  12.4*  --   --  11.7*  --   --  10.7*  ?HCT 48.2  --   --   --  38.4*  --   --  36.6*  --   --  34.3*  ?PLT 179  --   --   --  133*  --   --  133*  --   --  126*  ?LABPROT  --   --   --   --  14.2  --   --   --   --   --   --   ?INR  --   --   --   --  1.1  --   --   --   --   --   --   ?HEPARINUNFRC  --    < >  --   --  <0.10* 0.26*   < > 0.85* 0.66 0.75* 0.79*  ?CREATININE 2.52*  --   --   --  3.37* 3.54*  --  3.55*  --   --   --   ?TROPONINIHS 39*  --  313* >24,000*  --   --   --   --   --   --   --   ? < > = values in this interval not displayed.  ? ? ? ?Estimated Creatinine Clearance: 17 mL/min (A) (by C-G formula based on SCr of 3.55 mg/dL (H)). ? ? ?Assessment: ?89 YOM presenting with chest pain, found to have an NSTEMI and started on IV heparin. Plan is for cath if renal function stabilizes, pharmacy to dose heparin.  ? ?Heparin level again came back supratherapeutic at 0.79. We will reduce rate and check later this morning.   ? ?Goal of Therapy:  ?Heparin level 0.3-0.7 units/ml ?Monitor platelets by anticoagulation protocol: Yes ?  ?Plan:  ?Decrease IV heparin gtt to 1300 units/hr ?Recheck level in 8 hours ?Daily heparin level, CBC ?Monitor for signs/symptoms of  bleeding ?F/u plans for oral [facility] after cath ? ?Erin Hearing PharmD., BCPS ?Clinical Pharmacist ?12/27/2021 3:40 AM ? ?

## 2021-12-27 NOTE — Progress Notes (Signed)
Inpatient Diabetes Program Recommendations ? ?AACE/ADA: New Consensus Statement on Inpatient Glycemic Control (2015) ? ?Target Ranges:  Prepandial:   less than 140 mg/dL ?     Peak postprandial:   less than 180 mg/dL (1-2 hours) ?     Critically ill patients:  140 - 180 mg/dL  ? ?Lab Results  ?Component Value Date  ? GLUCAP 234 (H) 12/27/2021  ? HGBA1C 7.1 (H) 12/24/2021  ? ? ?Review of Glycemic Control ? Latest Reference Range & Units 12/25/21 21:25 12/26/21 06:14 12/26/21 12:37 12/26/21 16:51 12/26/21 21:13 12/27/21 06:13 12/27/21 11:04  ?Glucose-Capillary 70 - 99 mg/dL 233 (H) 240 (H) 265 (H) 285 (H) 247 (H) 195 (H) 234 (H)  ?Diabetes history: DM 2 ?Outpatient Diabetes medications:  ?Lantus 43 units daily ?Amaryl 4 mg daily ?Current orders for Inpatient glycemic control:  ?Novolog 0-20 units tid with meals ?Semglee 43 units daily ?Prednisone 50 mg with breakfast ?Inpatient Diabetes Program Recommendations:   ? ?Please consider adding Novolog 4 units tid with meals (hold if patient eats less than 50% or NPO).  ? ?Thanks,  ?Adah Perl, RN, BC-ADM ?Inpatient Diabetes Coordinator ?Pager [phone]  (8a-5p) ? ? ? ?

## 2021-12-28 DIAGNOSIS — I255 Ischemic cardiomyopathy: Secondary | ICD-10-CM

## 2021-12-28 DIAGNOSIS — I25700 Atherosclerosis of coronary artery bypass graft(s), unspecified, with unstable angina pectoris: Secondary | ICD-10-CM

## 2021-12-28 DIAGNOSIS — J810 Acute pulmonary edema: Secondary | ICD-10-CM | POA: Diagnosis not present

## 2021-12-28 DIAGNOSIS — I5043 Acute on chronic combined systolic (congestive) and diastolic (congestive) heart failure: Secondary | ICD-10-CM

## 2021-12-28 DIAGNOSIS — I25110 Atherosclerotic heart disease of native coronary artery with unstable angina pectoris: Secondary | ICD-10-CM

## 2021-12-28 DIAGNOSIS — N179 Acute kidney failure, unspecified: Secondary | ICD-10-CM

## 2021-12-28 DIAGNOSIS — E782 Mixed hyperlipidemia: Secondary | ICD-10-CM

## 2021-12-28 DIAGNOSIS — J9601 Acute respiratory failure with hypoxia: Secondary | ICD-10-CM | POA: Diagnosis not present

## 2021-12-28 DIAGNOSIS — G4733 Obstructive sleep apnea (adult) (pediatric): Secondary | ICD-10-CM

## 2021-12-28 DIAGNOSIS — I10 Essential (primary) hypertension: Secondary | ICD-10-CM

## 2021-12-28 LAB — BASIC METABOLIC PANEL
Anion gap: 9 (ref 5–15)
BUN: 94 mg/dL — ABNORMAL HIGH (ref 8–23)
CO2: 26 mmol/L (ref 22–32)
Calcium: 8.7 mg/dL — ABNORMAL LOW (ref 8.9–10.3)
Chloride: 100 mmol/L (ref 98–111)
Creatinine, Ser: 3.31 mg/dL — ABNORMAL HIGH (ref 0.61–1.24)
GFR, Estimated: 18 mL/min — ABNORMAL LOW (ref >60)
Glucose, Bld: 160 mg/dL — ABNORMAL HIGH (ref 70–99)
Potassium: 4.5 mmol/L (ref 3.5–5.1)
Sodium: 135 mmol/L (ref 135–145)

## 2021-12-28 LAB — GLUCOSE, CAPILLARY
Glucose-Capillary: 99 mg/dL (ref 70–99)
Glucose-Capillary: 92 mg/dL (ref 70–99)
Glucose-Capillary: 139 mg/dL — ABNORMAL HIGH (ref 70–99)
Glucose-Capillary: 179 mg/dL — ABNORMAL HIGH (ref 70–99)
Glucose-Capillary: 189 mg/dL — ABNORMAL HIGH (ref 70–99)

## 2021-12-28 LAB — CBC
HCT: 35.1 % — ABNORMAL LOW (ref 39.0–52.0)
Hemoglobin: 11.5 g/dL — ABNORMAL LOW (ref 13.0–17.0)
MCH: 29.8 pg (ref 26.0–34.0)
MCHC: 32.8 g/dL (ref 30.0–36.0)
MCV: 90.9 fL (ref 80.0–100.0)
Platelets: 129 10*3/uL — ABNORMAL LOW (ref 150–400)
RBC: 3.86 MIL/uL — ABNORMAL LOW (ref 4.22–5.81)
RDW: 15.7 % — ABNORMAL HIGH (ref 11.5–15.5)
WBC: 9.8 10*3/uL (ref 4.0–10.5)
nRBC: 0 % (ref 0.0–0.2)

## 2021-12-28 LAB — HEPARIN LEVEL (UNFRACTIONATED): Heparin Unfractionated: 0.39 IU/mL (ref 0.30–0.70)

## 2021-12-28 MED ORDER — APIXABAN 2.5 MG PO TABS
2.5000 mg | ORAL_TABLET | Freq: Two times a day (BID) | ORAL | Status: DC
  Administered 2021-12-28 – 2021-12-29 (×3): 2.5 mg via ORAL
  Filled 2021-12-28 (×3): qty 1

## 2021-12-28 MED ORDER — ISOSORBIDE DINITRATE 10 MG PO TABS
20.0000 mg | ORAL_TABLET | Freq: Two times a day (BID) | ORAL | Status: DC
  Administered 2021-12-28 – 2021-12-29 (×2): 20 mg via ORAL
  Filled 2021-12-28 (×2): qty 2

## 2021-12-28 MED ORDER — CLOPIDOGREL BISULFATE 75 MG PO TABS
75.0000 mg | ORAL_TABLET | Freq: Every day | ORAL | Status: DC
  Administered 2021-12-28 – 2021-12-29 (×2): 75 mg via ORAL
  Filled 2021-12-28 (×2): qty 1

## 2021-12-28 MED ORDER — HYDRALAZINE HCL 25 MG PO TABS: 25.0000 mg | ORAL_TABLET | Freq: Every day | ORAL | Status: DC

## 2021-12-28 MED ORDER — HYDRALAZINE HCL 10 MG PO TABS: 20.0000 mg | ORAL_TABLET | Freq: Every day | ORAL | Status: DC

## 2021-12-28 MED ORDER — HYDRALAZINE HCL 25 MG PO TABS
25.0000 mg | ORAL_TABLET | Freq: Two times a day (BID) | ORAL | Status: DC
  Administered 2021-12-28 – 2021-12-29 (×3): 25 mg via ORAL
  Filled 2021-12-28 (×3): qty 1

## 2021-12-28 NOTE — Discharge Instructions (Signed)
Information on my medicine - ELIQUIS? (apixaban) ? ?This medication education was reviewed with me or my healthcare representative as part of my discharge preparation.  The pharmacist that spoke with me during my hospital stay was:  Wendee Copp, [facility] ? ?Why was Eliquis? prescribed for you? ?Eliquis? was prescribed for you to reduce the risk of a blood clot forming that can cause a stroke if you have a medical condition called atrial fibrillation (a type of irregular heartbeat). ? ?What do You need to know about Eliquis? ? ?Take your Eliquis? TWICE DAILY - one tablet in the morning and one tablet in the evening with or without food. If you have difficulty swallowing the tablet whole please discuss with your pharmacist how to take the medication safely. ? ?Take Eliquis? exactly as prescribed by your doctor and DO NOT stop taking Eliquis? without talking to the doctor who prescribed the medication.  Stopping may increase your risk of developing a stroke.  Refill your prescription before you run out. ? ?After discharge, you should have regular check-up appointments with your healthcare provider that is prescribing your Eliquis?.  In the future your dose may need to be changed if your kidney function or weight changes by a significant amount or as you get older. ? ?What do you do if you miss a dose? ?If you miss a dose, take it as soon as you remember on the same day and resume taking twice daily.  Do not take more than one dose of ELIQUIS at the same time to make up a missed dose. ? ?Important Safety Information ?A possible side effect of Eliquis? is bleeding. You should call your healthcare provider right away if you experience any of the following: ?Bleeding from an injury or your nose that does not stop. ?Unusual colored urine (red or dark brown) or unusual colored stools (red or black). ?Unusual bruising for unknown reasons. ?A serious fall or if you hit your head (even if there is no bleeding). ? ?Some  medicines may interact with Eliquis? and might increase your risk of bleeding or clotting while on Eliquis?Marland Kitchen To help avoid this, consult your healthcare provider or pharmacist prior to using any new prescription or non-prescription medications, including herbals, vitamins, non-steroidal anti-inflammatory drugs (NSAIDs) and supplements. ? ?This website has more information on Eliquis? (apixaban): http://www.eliquis.com/eliquis/home ? ? ? ?___________________________________________________________________________________________________________________ ? ?Information about your medication: Plavix (anti-platelet agent) ? ?Generic Name (Brand): clopidogrel (Plavix), once daily medication ? ?PURPOSE: You are taking this medication along with aspirin to lower your chance of having a heart attack, stroke, or blood clots in your heart stent. These can be fatal. Plavix and aspirin help prevent platelets from sticking together and forming a clot that can block an artery or your stent.  ? ?Common SIDE EFFECTS you may experience include: bruising or bleeding more easily, shortness of breath ? ?Do not stop taking PLAVIX without talking to the doctor who prescribes it for you. People who are treated with a stent and stop taking Plavix too soon, have a higher risk of getting a blood clot in the stent, having a heart attack, or dying. If you stop Plavix because of bleeding, or for other reasons, your risk of a heart attack or stroke may increase.  ? ?Avoid taking NSAID agents or anti-inflammatory medications such as ibuprofen, naproxen given increased bleed risk with plavix - can use acetaminophen (Tylenol) if needed for pain. ? ?Avoid taking over the counter stomach medications omeprazole (Prilosec) or esomeprazole (Nexium)  since these do interact and make plavix less effective - ask your pharmacist or doctor for alterative agents if needed for heartburn or GERD.  ? ?Tell all of your doctors and dentists that you are taking  Plavix. They should talk to the doctor who prescribed Plavix for you before you have any surgery or invasive procedure.  ? ?Contact your health care provider if you experience: severe or uncontrollable bleeding, pink/red/brown urine, vomiting blood or vomit that looks like "coffee grounds", red or black stools (looks like tar), coughing up blood or blood clots ?---------------------------------------------------------------------------------------------------------------------- ? ?

## 2021-12-28 NOTE — Progress Notes (Signed)
[name] Progress note  Requesting MD:  Indication for Consultation: AKI on CKDIIIb  HPI:  John [name] is a 81 yo M with PMH of John [name], John John [name], John John [name], John John [name], John John [name] found to have NSTEMI w/ troponins 24K. His acute respiratory failure required BiPAP then NRB John aggressive diuresis. CXR + for pulmonary edema. He [name] subsequently transferred to [facility] for evaluation for LHC. However, his renal function has worsened after his ACS event John after receiving aggressive diuresis. Cardiology has deferred [place] in the setting of his improvement of ACS symptoms John AKI. Nephrology has been asked to follow for AKI on [name]. He stated he [name] out of his flomax for 2-3 weeks before his admission.     He completed his heparin John NTG drips yesterday evening. Transitioned to clopidogrel John Isodril John hydralazine. Holding ARB/ACE John diuretics d/t renal function. On exam today, his [name] has improved greatly as well as his John [name] John hypoxemia. Currently sating well on RA. UOP adequate. Afebrile. BP 147/87.   [id number] potassium 4.5 chloride 100 CO2 26 BUN 95 creatinine 3.31 glucose 160 calcium 8.7 Phos 5.5 Hgb 11.5  12/26/21 Renal US: Bilateral renal cysts similar to that seen on recent CT examination. No acute abnormality noted.  Mild increased echogenicity consistent with medical renal disease.  Creatinine, Ser  Date/Time Value Ref Range Status  12/28/2021 01:48 AM 3.31 (H) 0.61 - 1.24 mg/dL Final  12/27/2021 01:23 AM 3.79 (H) 0.61 - 1.24 mg/dL Final  12/26/2021 05:59 AM 3.55 (H) 0.61 - 1.24 mg/dL Final  12/25/2021 10:52 AM 3.54 (H) 0.61 - 1.24 mg/dL Final  12/25/2021 01:22 AM 3.37 (H) 0.61 - 1.24 mg/dL Final  12/24/2021 04:19 AM 2.52 (H) 0.61 - 1.24 mg/dL Final  09/15/2021 05:23 AM 2.15 (H) 0.61 - 1.24 mg/dL Final  09/14/2021 04:23 AM 2.35 (H) 0.61 - 1.24 mg/dL Final  09/13/2021 02:22 AM 1.98  (H) 0.61 - 1.24 mg/dL Final  05/05/2021 02:38 PM 2.30 (H) 0.76 - 1.27 mg/dL Final  12/20/2020 02:46 AM 1.76 (H) 0.61 - 1.24 mg/dL Final  12/19/2020 12:43 PM 1.52 (H) 0.61 - 1.24 mg/dL Final  06/25/2013 09:36 AM 1.60 (H) 0.50 - 1.35 mg/dL Final     PMHx:   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)    John (benign prostatic hyperplasia)    John (coronary artery disease)    Cholesteatoma of attic    Depression    Diabetes mellitus ([place])    Ear disease    GERD (gastroesophageal reflux disease)    Hyperlipidemia    Hypertension    John [name] (peripheral vascular disease) ([place])    Stroke [facility])     Past Surgical History:  Procedure Laterality Date   BONE ANCHORED HEARING AID IMPLANT Left 06/25/2013   Procedure: BONE ANCHORED HEARING AID (BAHA) IMPLANT;  Surgeon: Fannie Knee, MD;  Location: [place];  Service: ENT;  Laterality: Left;   CAROTID STENT INSERTION Left    COCHLEAR IMPLANT     CORONARY STENT PLACEMENT     MASS EXCISION Left 06/25/2013   Procedure: EXCISION LEFT TEMPORAL MASS;  Surgeon: Fannie Knee, MD;  Location: [place];  Service: ENT;  Laterality: Left;   TONSILLECTOMY      Family Hx:  Family History  Problem Relation Age of Onset   Diabetes Other    Diabetes  Mother    Diabetes Maternal Aunt    Depression Other    Depression Other    Drug abuse Daughter    Diabetes Daughter     Social History:  reports that he quit smoking about 32 years ago. His smoking use included cigarettes. He has never used smokeless tobacco. He reports that he does not currently use alcohol. He reports that he does not use drugs.  Allergies:  Allergies  Allergen Reactions   Brilinta [Ticagrelor] Shortness Of Breath   Penicillins Hives   Ezetimibe-Simvastatin Other (See Comments)    Myalgia     Medications: Prior to Admission medications   Medication Sig Start Date End Date Taking? Authorizing Provider  albuterol (VENTOLIN  HFA) 108 (90 Base) MCG/ACT inhaler Inhale 2 puffs into the lungs every 6 (six) hours as needed for wheezing or shortness of breath. 12/20/20  Yes Pokhrel, Laxman, MD  allopurinol (ZYLOPRIM) 300 MG tablet Take 300 mg by mouth daily. 06/21/12  Yes [provider]  aspirin 81 MG tablet Take 81 mg by mouth daily.   Yes [provider]  canagliflozin (INVOKANA) 100 MG TABS tablet Take 100 mg by mouth daily. 04/28/20  Yes [provider]  candesartan (ATACAND) 16 MG tablet Take 16 mg by mouth daily. 10/02/21  Yes [provider]  carvedilol (COREG) 12.5 MG tablet Take 1 tablet (12.5 mg total) by mouth 2 (two) times daily with a meal. 09/18/21  Yes Crenshaw, Denice Bors, MD  cholecalciferol (VITAMIN D3) 25 MCG (1000 UNIT) tablet Take 1,000 Units by mouth daily.   Yes [provider]  Cyanocobalamin (VITAMIN B 12 PO) Take 1 tablet by mouth daily.   Yes [provider]  furosemide (LASIX) 40 MG tablet Take 40 mg by mouth daily. 10/02/21  Yes [provider]  gabapentin (NEURONTIN) 300 MG capsule Take 300 mg by mouth 2 (two) times daily. 08/04/19  Yes [provider]  glimepiride (AMARYL) 4 MG tablet Take 4 mg by mouth daily with breakfast. 05/02/15  Yes [provider]  insulin glargine (LANTUS SOLOSTAR) 100 UNIT/ML Solostar Pen Inject 0-45 Units into the skin daily with supper. Dose per sliding scale. 10/01/19  Yes [provider]  pantoprazole (PROTONIX) 40 MG tablet Take 40 mg by mouth daily with breakfast. 08/19/14  Yes [provider]  rosuvastatin (CRESTOR) 20 MG tablet Take 1 tablet (20 mg total) by mouth every evening. 12/14/21  Yes Crenshaw, Denice Bors, MD  senna (SENOKOT) 8.6 MG tablet Take 2-3 tablets by mouth daily as needed for constipation.   Yes [provider]  sertraline (ZOLOFT) 25 MG tablet Take 1 tablet (25 mg total) by mouth every evening. 01/09/21  Yes Thayer Headings, PMHNP  tamsulosin (FLOMAX)  0.4 MG CAPS capsule Take 0.4 mg by mouth daily. 09/22/14  Yes [provider]      Labs:  Results for orders placed or performed during the hospital encounter of 12/24/21 (from the past 48 hour(s))  Glucose, capillary     Status: Abnormal   Collection Time: 12/26/21 12:37 PM  Result Value Ref Range   Glucose-Capillary 265 (H) 70 - 99 mg/dL    Comment: Glucose reference range applies only to samples taken after fasting for at least 8 hours.  Heparin level (unfractionated)     Status: None   Collection Time: 12/26/21  2:33 PM  Result Value Ref Range   Heparin Unfractionated 0.66 0.30 - 0.70 IU/mL    Comment: (NOTE) The clinical reportable  range upper limit is being lowered to >1.10 to align with the FDA approved guidance for the current laboratory assay.  If heparin results are below expected values, John patient dosage has  been confirmed, suggest follow up testing of antithrombin III levels. Performed at [facility], [place] [address] [place], [place] [address]   Glucose, capillary     Status: Abnormal   Collection Time: 12/26/21  4:51 PM  Result Value Ref Range   Glucose-Capillary 285 (H) 70 - 99 mg/dL    Comment: Glucose reference range applies only to samples taken after fasting for at least 8 hours.  Urinalysis, Complete w Microscopic Urine, Catheterized     Status: Abnormal   Collection Time: 12/26/21  6:25 PM  Result Value Ref Range   Color, Urine YELLOW YELLOW   APPearance HAZY (A) CLEAR   Specific Gravity, Urine 1.015 1.005 - 1.030   pH 5.0 5.0 - 8.0   Glucose, UA >=500 (A) NEGATIVE mg/dL   Hgb urine dipstick MODERATE (A) NEGATIVE   Bilirubin Urine NEGATIVE NEGATIVE   Ketones, ur NEGATIVE NEGATIVE mg/dL   Protein, ur 30 (A) NEGATIVE mg/dL   Nitrite NEGATIVE NEGATIVE   Leukocytes,Ua TRACE (A) NEGATIVE   RBC / HPF 11-20 0 - 5 RBC/hpf   WBC, UA 6-10 0 - 5 WBC/hpf   Bacteria, UA FEW (A) NONE SEEN   Squamous Epithelial / LPF 0-5 0 - 5   Mucus PRESENT     Hyaline Casts, UA PRESENT    Granular Casts, UA PRESENT     Comment: Performed at [facility], [address], [place], [place] [address]  Heparin level (unfractionated)     Status: Abnormal   Collection Time: 12/26/21  8:51 PM  Result Value Ref Range   Heparin Unfractionated 0.75 (H) 0.30 - 0.70 IU/mL    Comment: (NOTE) The clinical reportable range upper limit is being lowered to >1.10 to align with the FDA approved guidance for the current laboratory assay.  If heparin results are below expected values, John patient dosage has  been confirmed, suggest follow up testing of antithrombin III levels. Performed at [facility], [place] [address] [place], [place] [address]   Glucose, capillary     Status: Abnormal   Collection Time: 12/26/21  9:13 PM  Result Value Ref Range   Glucose-Capillary 247 (H) 70 - 99 mg/dL    Comment: Glucose reference range applies only to samples taken after fasting for at least 8 hours.   Comment 1 Notify RN    Comment 2 Document in Chart   CBC     Status: Abnormal   Collection Time: 12/27/21  1:23 AM  Result Value Ref Range   WBC 11.2 (H) 4.0 - 10.5 K/uL   RBC 3.76 (L) 4.22 - 5.81 MIL/uL   Hemoglobin 10.7 (L) 13.0 - 17.0 g/dL   HCT 34.3 (L) 39.0 - 52.0 %   MCV 91.2 80.0 - 100.0 fL   MCH 28.5 26.0 - 34.0 pg   MCHC 31.2 30.0 - 36.0 g/dL   RDW 15.5 11.5 - 15.5 %   Platelets 126 (L) 150 - 400 K/uL   nRBC 0.2 0.0 - 0.2 %    Comment: Performed at [facility], [place] [address] [place], [place] [address]  Brain natriuretic peptide     Status: Abnormal   Collection Time: 12/27/21  1:23 AM  Result Value Ref Range   B Natriuretic Peptide 649.9 (H) 0.0 -  100.0 pg/mL    Comment: Performed at [facility], [place] [address], [place], [place] [address]  Heparin level (unfractionated)     Status: Abnormal   Collection Time: 12/27/21  1:23 AM  Result Value Ref Range   Heparin Unfractionated 0.79 (H) 0.30 - 0.70 IU/mL    Comment: (NOTE) The  clinical reportable range upper limit is being lowered to >1.10 to align with the FDA approved guidance for the current laboratory assay.  If heparin results are below expected values, John patient dosage has  been confirmed, suggest follow up testing of antithrombin III levels. Performed at [facility], [name] [address] [place], [place] [address]   Phosphorus     Status: Abnormal   Collection Time: 12/27/21  1:23 AM  Result Value Ref Range   Phosphorus 5.5 (H) 2.5 - 4.6 mg/dL    Comment: Performed at [facility] [address] [place], [place] [address]  Basic metabolic panel     Status: Abnormal   Collection Time: 12/27/21  1:23 AM  Result Value Ref Range   Sodium 133 (L) 135 - 145 mmol/L   Potassium 4.7 3.5 - 5.1 mmol/L   Chloride 98 98 - 111 mmol/L   CO2 25 22 - 32 mmol/L   Glucose, Bld 192 (H) 70 - 99 mg/dL    Comment: Glucose reference range applies only to samples taken after fasting for at least 8 hours.   BUN 91 (H) 8 - 23 mg/dL   Creatinine, Ser 3.79 (H) 0.61 - 1.24 mg/dL   Calcium 8.7 (L) 8.9 - 10.3 mg/dL   GFR, Estimated 15 (L) >60 mL/min    Comment: (NOTE) Calculated using the CKD-EPI Creatinine Equation (2021)    Anion gap 10 5 - 15    Comment: Performed at [facility] [address], [place], [place] [address]  Glucose, capillary     Status: Abnormal   Collection Time: 12/27/21  6:13 AM  Result Value Ref Range   Glucose-Capillary 195 (H) 70 - 99 mg/dL    Comment: Glucose reference range applies only to samples taken after fasting for at least 8 hours.   Comment 1 Notify RN    Comment 2 Document in Chart   Glucose, capillary     Status: Abnormal   Collection Time: 12/27/21 11:04 AM  Result Value Ref Range   Glucose-Capillary 234 (H) 70 - 99 mg/dL    Comment: Glucose reference range applies only to samples taken after fasting for at least 8 hours.  Heparin level (unfractionated)     Status: Abnormal   Collection Time: 12/27/21 11:47 AM   Result Value Ref Range   Heparin Unfractionated 1.06 (H) 0.30 - 0.70 IU/mL    Comment: (NOTE) The clinical reportable range upper limit is being lowered to >1.10 to align with the FDA approved guidance for the current laboratory assay.  If heparin results are below expected values, John patient dosage has  been confirmed, suggest follow up testing of antithrombin III levels. Performed at [facility], [place] [address], [place], [place] [address]   Glucose, capillary     Status: Abnormal   Collection Time: 12/27/21  4:03 PM  Result Value Ref Range   Glucose-Capillary 254 (H) 70 - 99 mg/dL    Comment: Glucose reference range applies only to samples taken after fasting for at least 8 hours.  Glucose, capillary     Status: Abnormal   Collection Time: 12/27/21  9:25 PM  Result Value Ref Range   Glucose-Capillary 178 (H) 70 - 99 mg/dL    Comment: Glucose reference range applies only to samples taken after fasting for at least 8 hours.  Heparin level (unfractionated)     Status: None   Collection Time: 12/27/21 10:04 PM  Result Value Ref Range   Heparin Unfractionated 0.35 0.30 - 0.70 IU/mL    Comment: (NOTE) The clinical reportable range upper limit is being lowered to >1.10 to align with the FDA approved guidance for the current laboratory assay.  If heparin results are below expected values, John patient dosage has  been confirmed, suggest follow up testing of antithrombin III levels. Performed at [facility], [place] [address] [place], [place] [address]   CBC     Status: Abnormal   Collection Time: 12/28/21  1:48 AM  Result Value Ref Range   WBC 9.8 4.0 - 10.5 K/uL   RBC 3.86 (L) 4.22 - 5.81 MIL/uL   Hemoglobin 11.5 (L) 13.0 - 17.0 g/dL   HCT 35.1 (L) 39.0 - 52.0 %   MCV 90.9 80.0 - 100.0 fL   MCH 29.8 26.0 - 34.0 pg   MCHC 32.8 30.0 - 36.0 g/dL   RDW 15.7 (H) 11.5 - 15.5 %   Platelets 129 (L) 150 - 400 K/uL   nRBC 0.0 0.0 - 0.2 %    Comment: Performed at [facility], [place] [address] [place] [address], [place], [place] [address]  Heparin level (unfractionated)     Status: None   Collection Time: 12/28/21  1:48 AM  Result Value Ref Range   Heparin Unfractionated 0.39 0.30 - 0.70 IU/mL    Comment: (NOTE) The clinical reportable range upper limit is being lowered to >1.10 to align with the FDA approved guidance for the current laboratory assay.  If heparin results are below expected values, John patient dosage has  been confirmed, suggest follow up testing of antithrombin III levels. Performed at [facility], [place] [address] [place], [place] [address]   Basic metabolic panel     Status: Abnormal   Collection Time: 12/28/21  1:48 AM  Result Value Ref Range   Sodium 135 135 - 145 mmol/L   Potassium 4.5 3.5 - 5.1 mmol/L   Chloride 100 98 - 111 mmol/L   CO2 26 22 - 32 mmol/L   Glucose, Bld 160 (H) 70 - 99 mg/dL    Comment: Glucose reference range applies only to samples taken after fasting for at least 8 hours.   BUN 94 (H) 8 - 23 mg/dL   Creatinine, Ser 3.31 (H) 0.61 - 1.24 mg/dL   Calcium 8.7 (L) 8.9 - 10.3 mg/dL   GFR, Estimated 18 (L) >60 mL/min    Comment: (NOTE) Calculated using the CKD-EPI Creatinine Equation (2021)    Anion gap 9 5 - 15    Comment: Performed at [facility] [address], [place], [place] [address]  Glucose, capillary     Status: None   Collection Time: 12/28/21  6:08 AM  Result Value Ref Range   Glucose-Capillary 99 70 - 99 mg/dL    Comment: Glucose reference range applies only to samples taken after fasting for at least 8 hours.  Glucose, capillary     Status: None   Collection Time: 12/28/21  8:10 AM  Result Value Ref Range   Glucose-Capillary 92 70 - 99 mg/dL    Comment: Glucose reference range applies only to samples  taken after fasting for at least 8 hours.   Comment 1 Notify RN    Comment 2 Document in Chart      ROS: A comprehensive ROS [name] performed John all were negative.  Physical  Exam: Vitals:   12/28/21 0438 12/28/21 0817  BP: (!) 133/58 (!) 147/87  Pulse: 65 68  Resp: 15 16  Temp: 97.6 F (36.4 C) 97.7 F (36.5 C)  SpO2: 98% 98%     General exam: Pleasant male lying in bed, AAOx3 HEENT: No JVD CVS: S1-S2, irregular rhythm Lungs: Trace rales Abdomen: Soft, nontender, bowel sounds present Extremities: no edema  Skin: No rashes Psychiatry: Judgement John insight appear normal. Mood & affect appropriate.   Assessment/Plan: 1.AKI on [name], improving - Likely multifactorial from aggressive diuresis, bladder obstruction 2/2 John. May have a cardiorenal component as well; recommend avoiding contrast exposure. Delay non-urgent cardiac cath for now.   -No urgent need for dialysis  -Baseline Cr around 2; Cr currently 3.31; BUN:Cr of 28.4 -Electrolytes stable -Avoids NSAIDs, nephrotoxins -Monitor UOP, BMP   2. Hypertension/volume  -  -Appears euvolemic on exam. Hold diuretics while AKI improves. -Continue John [name] management with Coreg, Hydralazine    4. Anemia  - Mild normocytic anemia. Could consider iron panel for further workup.   I have seen John examined this patient John agree with the plan of care.  Patient with significant improvement  DEVONTAY CELAYA 12/28/2021, 11:10 AM    Abelardo Diesel Penninger 12/28/2021, 8:38 AM

## 2021-12-28 NOTE — Progress Notes (Signed)
Tele called RN and informed about patient in bigeminy and having 5 runs of V tach. Assessed patient and completely asymptomatic. Paged Cardiology and they stated they are aware of the rhythm. No new orders at this time. Will continue to monitor.  ?John [name]  ?

## 2021-12-28 NOTE — Progress Notes (Addendum)
Progress Note  Patient Name: John [name] Date of Encounter: 12/28/2021  Primary Cardiologist: Kirk Ruths MD  Subjective   Patient reports he is not having any chest pain. He did become short of breath after ambulating to the restroom, but no SOB with sitting.   Inpatient Medications    Scheduled Meds:  allopurinol  100 mg Oral Daily   apixaban  2.5 mg Oral BID   carvedilol  12.5 mg Oral BID WC   Chlorhexidine Gluconate Cloth  6 each Topical Daily   cholecalciferol  1,000 Units Oral Daily   clopidogrel  75 mg Oral Daily   folic acid  1 mg Oral Daily   gabapentin  300 mg Oral QHS   [START ON 12/29/2021] hydrALAZINE  25 mg Oral Daily   And   hydrALAZINE  20 mg Oral QHS   insulin aspart  0-20 Units Subcutaneous TID WC   insulin glargine-yfgn  50 Units Subcutaneous Daily   isosorbide dinitrate  10 mg Oral BID   multivitamin with minerals  1 tablet Oral Daily   pantoprazole  40 mg Oral Daily   predniSONE  50 mg Oral Q breakfast   rosuvastatin  20 mg Oral QPM   sertraline  25 mg Oral QPM   sodium chloride flush  3 mL Intravenous Q12H   sodium chloride flush  3 mL Intravenous Q12H   tamsulosin  0.4 mg Oral QPM   thiamine  100 mg Oral Daily   Or   thiamine  100 mg Intravenous Daily   Continuous Infusions:  sodium chloride Stopped (12/24/21 1700)   levofloxacin (LEVAQUIN) IV 500 mg (12/28/21 0851)   PRN Meds: sodium chloride, acetaminophen **OR** acetaminophen, bisacodyl, hydrALAZINE, HYDROmorphone (DILAUDID) injection, ipratropium, levalbuterol, LORazepam **OR** LORazepam, ondansetron **OR** ondansetron (ZOFRAN) IV, oxyCODONE, senna-docusate, traZODone   Vital Signs    Vitals:   12/27/21 2318 12/28/21 0438 12/28/21 0817 12/28/21 1146  BP: 140/65 (!) 133/58 (!) 147/87 (!) 146/70  Pulse: 66 65 68 66  Resp: 12 15 16 18   Temp: 98 F (36.7 C) 97.6 F (36.4 C) 97.7 F (36.5 C) 97.8 F (36.6 C)  TempSrc: Oral Oral Oral Oral  SpO2: 100% 98% 98% 94%  Weight:  90.5  kg    Height:        Intake/Output Summary (Last 24 hours) at 12/28/2021 1230 Last data filed at 12/28/2021 1217 Gross per 24 hour  Intake 871.02 ml  Output 1045 ml  Net -173.98 ml     I/O since admission:   Filed Weights   12/26/21 0432 12/27/21 0432 12/28/21 0438  Weight: 91.8 kg 90.1 kg 90.5 kg    Telemetry    Few PVCs, short run of bigeminy, otherwise NSR- Personally Reviewed  ECG    ECG (independently read by me): No new EKG  Physical Exam   BP (!) 146/70 (BP Location: Left Arm)    Pulse 66    Temp 97.8 F (36.6 C) (Oral)    Resp 18    Ht 5\' 6"  (1.676 m)    Wt 90.5 kg    SpO2 94%    BMI 32.22 kg/m  Constitutional: Well-developed, well-nourished, sitting in his chair in no distress.  HENT:  Head: Normocephalic and atraumatic.  Neck: Normal range of motion.  Cardiovascular: Normal rate, regular rhythm, intact distal pulses. No gallop and no friction rub.  No murmur heard. No lower extremity edema  Pulmonary: Non labored breathing on 6L [place], no wheezing or rales  Abdominal: Soft.  Normal bowel sounds. Non distended and non tender Musculoskeletal: Normal range of motion.        General: No tenderness or edema.  Neurological: Alert and oriented to person, place, and time. Non focal  Skin: Skin is warm and dry.    Labs    Chemistry Recent Labs  Lab 12/24/21 0419 12/25/21 0122 12/26/21 0559 12/27/21 0123 12/28/21 0148  NA 137   < > 135 133* 135  K 4.2   < > 4.9 4.7 4.5  CL 101   < > 97* 98 100  CO2 26   < > 23 25 26   GLUCOSE 263*   < > 234* 192* 160*  BUN 38*   < > 77* 91* 94*  CREATININE 2.52*   < > 3.55* 3.79* 3.31*  CALCIUM 9.0   < > 9.0 8.7* 8.7*  PROT 8.4*  --   --   --   --   ALBUMIN 4.2  --  3.3*  --   --   AST 32  --   --   --   --   ALT 25  --   --   --   --   ALKPHOS 74  --   --   --   --   BILITOT 0.9  --   --   --   --   GFRNONAA 25*   < > 16* 15* 18*  ANIONGAP 10   < > 15 10 9    < > = values in this interval not displayed.       Hematology Recent Labs  Lab 12/26/21 0559 12/27/21 0123 12/28/21 0148  WBC 10.5 11.2* 9.8  RBC 4.03* 3.76* 3.86*  HGB 11.7* 10.7* 11.5*  HCT 36.6* 34.3* 35.1*  MCV 90.8 91.2 90.9  MCH 29.0 28.5 29.8  MCHC 32.0 31.2 32.8  RDW 15.6* 15.5 15.7*  PLT 133* 126* 129*     Cardiac Enzymes Component Ref Range & Units 1 d ago (12/24/21) 1 d ago (12/24/21) 1 d ago (12/24/21) 3 mo ago (09/13/21) 3 mo ago (09/13/21) 3 mo ago (09/13/21) 3 mo ago (09/13/21)  Troponin I (High Sensitivity) <18 ng/L >24,000 High Panic   313 High Panic  CM  39 High  CM  484 High Panic  CM  489 High Panic  CM  82 High  CM  34 High  CM    BNP Recent Labs  Lab 12/25/21 0122 12/26/21 0559 12/27/21 0123  BNP 1,009.5* 885.5* 649.9*      DDimer  Recent Labs  Lab 12/24/21 0419  DDIMER 2.19*      Lipid Panel     Component Value Date/Time   CHOL 99 12/24/2021 2008   TRIG 95 12/24/2021 2008   HDL 51 12/24/2021 2008   CHOLHDL 1.9 12/24/2021 2008   VLDL 19 12/24/2021 2008   [place] 29 12/24/2021 2008      Radiology    US RENAL  Result Date: 12/26/2021 CLINICAL DATA:  Acute renal injury EXAM: RENAL / URINARY TRACT ULTRASOUND COMPLETE COMPARISON:  12/24/2021 CT FINDINGS: Right Kidney: Renal measurements: 11.3 x 6.0 x 5.0 cm. = volume: 77 mL. 7 cm exophytic lower pole cyst is noted similar to that seen on prior CT examination. Mild increased echogenicity is noted. Left Kidney: Renal measurements: 11.3 x 4.9 x 5.0 cm. = volume: 146 mL. 4.8 cm mid left renal cyst is noted which appears simple in nature. Mild increased echogenicity is noted. Bladder: Compressed by Foley catheter.  Other: None. IMPRESSION: Bilateral renal cysts similar to that seen on recent CT examination. No acute abnormality noted. Mild increased echogenicity consistent with medical renal disease. Electronically Signed   By: Inez Catalina M.D.   On: 12/26/2021 20:07    Cardiac Studies   TTE 09/14/21: EF estimated 40 to 45% with mildly  decreased function.  Moderate LVH.  GRII DD-with mildly dilated left atrium.  Normal RV size and function.  Unable to assess RVP.  Normal RAP. Normal aortic and mitral valve.    TTE 12/25/2021: EF~35% with moderately reduced function.  Global HK.  Mild LV dilation.  Mild LVH.  GRII DD.  Normal RV function, but unable to assess RVP.  Mild aortic valve calcification but no stenosis.  Patient Profile     81 y.o. 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 He was subsequently transferred to [facility] for evaluation for LHC.  => Unfortunately, worsening renal function this hospitalization has precluded invasive ischemic evaluation.   Assessment & Plan    #Type I NSTEMI  Patient presented with acute onset chest pain, SOB, and diaphoresis and found to have markedly elevated troponins >24,000.  CT chest performed in the ED also revealed significant multivessel CAD involving the left main coronary as well. Patient was transferred to [facility] for evaluation for LHC. Echo 3/6 shows worsening EF compared with 08/2021 Echo. Given patient's AKI (apparent BL ~2)  in the setting of diuresis will continue to hold on catheterization. In addition, patient remains asymptomatic.  Heparin infusion d/c'd this AM and patient transitioned to [place] for afib below.  Also converted from aspirin 81 mg to Plavix 75 mg -Asx on isordil 10mg  BID/hydralazine 10 mg twice daily-->-Will increase oral hydralazine to 25mg  twice daily and Isordil 20 mg twice daily.  (Isordil/hydralazine used since patient cannot be on ACE/ARB due to acute on chronic kidney injury) -Continue Coreg 12.5mg  BID   -Continue to hold ACE/ARB given AKI -using hydralazine/nitrate -Continue crestor 20mg  qd, LDL 29 -Hold on catheterization at this time given renal function, will likely need evaluation in the outpatient setting once renal function normalizes. Follows closely with Dr. [name] has appointment 4/4 will likely need an appointment in 2 weeks.  => Can consider outpatient ischemic evaluation in 4 to 6 weeks if symptoms warrant.  Good option given the fact that he does have LAD stents, and recent infarction would be cardiac stress MRI (with Dr. Gardiner Rhyme)  #Acute on Chronic HFrEF Most recent EF 35%. He continues to be euvolemic on exam. O2 requirement increased this AM. No rales on exam.  -Daily weights  -Strict I's and O's  Renal function seems to have plateaued.  Hopefully this will begin his return of function.  Need to monitor for post ATN diuresis  -ACE/ARB, aldactone held due to renal function  #New Onset Atrial Fibrillation Noted to go in and out of atrial fibrillation while in the ED with his atrial fibrillation captured on EKG. Telemetry mostly being in NSR. pAFib is likely secondary to his acute MI and hypoxia/hypercapnia. CHA2DS2-VASc score is 6. His heparin gtt for NSTEMI was stopped  this AM and DOAC started.  -Continue telemetry -DOAC started this AM I-Eliquis 10 mg twice daily -Rate control with Coreg => if there is significant recurrence, would have low threshold to consider chemical cardioversion with amiodarone, to avoid exacerbation of CHF. -TTE as above indicating reduced EF from baseline.  #PVD  s/p R fem-bk-pop in 2019 - with NSTEMI presentation - convert ASA to Plavix  - Continue crestor   #AKI on CKD stage 3 BL sCr ~2 now 3.31. Thought to be due to cardiorenal syndrome initially given rales and c/f pulmonary edema on exam. Patient was however diuresed with worsening of his sCr. Potentially ATN as well.   Appears to be euvolemic and is now on room air. Renal U/S c/w some  degree of medicorenal disease. Urinalysis 3/7 with HC and granular casts suggesting patient may be intravascularly down.  -Will continue to hold lasix, will need close follow up with Dr. [name] to decide when to resume this. -Renal function improving though BUN increasing, could be related to patient being intravascularly down, he is also on prednisone. Would hold off on IV fluids and continue to see if patient's kidney function improves with oral rehydration only --Monitor for signs of post ATN diuresis. (With Foley catheter out, also need to monitor for degree obstruction)  #Acute Hypoxic and Hypercapneic Respiratory Failure / #Acute Pulmonary Edema -> resolved  #Possible CAP  Likely 2/2 pulmonary edema. Briefly tolerated BiPAP now breathing without distress on RA. -Aggressive pulm toilet  -On levaquin for possible PNA  #BPH -continue home flomax, has foley catheter in place. Per primary.   Rick Duff, MD PGY-2 Internal Medicine  Pager [phone] 12/28/2021, 12:31 PM     ATTENDING ATTESTATION  I have seen, examined and evaluated the patient this AM along with Dr. Eulas Post (R2) on rounds.  After reviewing all the available data and chart, we discussed the patients laboratory, study & physical findings as well as symptoms in detail. I agree with his findings, examination as well as impression recommendations as per our discussion.    Attending adjustments noted in italics.   Overall looks pretty good.  We have not completed 72 hours of IV heparin for treatment of his non-ST ovation MI.  With evidence of A-fib on initial EKG, we are converting to Plavix plus DOAC without aspirin. We are unable to use ACE inhibitor/Arni/ARB because of renal insufficiency (acute on chronic)-therefore using hydralazine nitrate.  Titrating dose of 25 mg twice daily hydralazine and 20 mg twice daily Isordil.  We will potentially titrate further to 37.5 mg twice daily if BPs tolerate tomorrow.  (This is  consistent with the dosing of BiDil) Continue statin    Glenetta Hew, M.D., M.S. Interventional Cardiologist   Pager # [phone] Phone # [phone] [address] [place], [place] [address]

## 2021-12-28 NOTE — Progress Notes (Signed)
SATURATION QUALIFICATIONS: (This note is used to comply with regulatory documentation for home oxygen) ? ?Patient Saturations on Room Air at Rest = 84% ? ?Patient Saturations on Room Air while Ambulating = 82% ? ?Patient Saturations on 4 Liters of oxygen while Ambulating = 98% ? ?Please briefly explain why patient needs home oxygen: N/A ?

## 2021-12-28 NOTE — Progress Notes (Addendum)
Occupational Therapy Treatment/Discharge ?Patient Details ?Name: John [name] ?MRN: [id number] ?DOB: November 18, 1940 ?Today's Date: 12/28/2021 ? ? ?History of present illness Patient is an 81 yo male presenting to [facility] on 3/[facility] with NSTEMI. Significantly elevated troponin levels in ED and new onset of A-fib. 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 Does use oxygen at home PRN. ?  ?OT comments ? Pt progressing well functionally, meeting 3/3 OT goals. Pt able to mobilize to/from bathroom using RW, complete toileting task and LB dressing tasks with assist for line mgmt only. Reinforced energy conservation strategies (emphasis on breathing techniques, rest breaks as needed), wearing O2 with bathroom mobility (reported decreased SOB with this technique today), O2 cord mgmt if discharging home with O2, and CHF mgmt strategies (emphasis on daily weights/logging weights; handout provided) with pt/daughter verbalizing understanding. Anticipate pt will do well in home environment with prn assist for IADLs and without need for OT follow-up. OT to sign off at acute level.   ? ?Recommendations for follow up therapy are one component of a multi-disciplinary discharge planning process, led by the attending physician.  Recommendations may be updated based on patient status, additional functional criteria and insurance authorization. ?   ?Follow Up Recommendations ? No OT follow up  ?  ?Assistance Recommended at Discharge Set up Supervision/Assistance  ?Patient can return home with the following ? Assistance with cooking/housework;Assist for transportation;Help with stairs or ramp for entrance ?  ?Equipment Recommendations ? None recommended by OT  ?  ?Recommendations for Other Services   ? ?  ?Precautions / Restrictions Precautions ?Precautions: Fall;Other (comment) ?Precaution Comments: monitor O2  (does not wear at baseline) ?Restrictions ?Weight Bearing Restrictions: No  ? ? ?  ? ?Mobility Bed Mobility ?Overal bed mobility: Modified Independent ?  ?  ?  ?  ?  ?  ?  ?  ? ?Transfers ?Overall transfer level: Modified independent ?Equipment used: Rolling walker (2 wheels) ?Transfers: Sit to/from Stand ?Sit to Stand: Modified independent (Device/Increase time) ?  ?  ?  ?  ?  ?General transfer comment: no assist or safety concerns; noted to use RW for counter balance as previously stated he does at home ?  ?  ?Balance Overall balance assessment: Needs assistance ?Sitting-balance support: Feet supported, No upper extremity supported ?Sitting balance-Leahy Scale: Good ?  ?  ?Standing balance support: Bilateral upper extremity supported, No upper extremity supported, During functional activity ?Standing balance-Leahy Scale: Fair ?  ?  ?  ?  ?  ?  ?  ?  ?  ?  ?  ?  ?   ? ?ADL either performed or assessed with clinical judgement  ? ?ADL Overall ADL's : Needs assistance/impaired ?  ?  ?  ?  ?  ?  ?  ?  ?Upper Body Dressing : Modified independent;Sitting ?Upper Body Dressing Details (indicate cue type and reason): to don gown around back ?Lower Body Dressing: Modified independent;Sit to/from stand ?Lower Body Dressing Details (indicate cue type and reason): to doff socks sitting EOB and don shoes ?Toilet Transfer: Modified Independent;Ambulation;Rolling walker (2 wheels) ?Toilet Transfer Details (indicate cue type and reason): assist for lines only ?Toileting- Clothing Manipulation and Hygiene: Modified independent ?Toileting - Clothing Manipulation Details (indicate cue type and reason): urination seated on toilet ?  ?  ?  ?General ADL Comments: Pt with reported SOB  with bathroom mobility for urination overnight and repositioning in bed. Pt reports not wearing O2 to bathroom. With O2 and cues for breathing techniques, pt reports no SOB with these tasks during session. Provided O2 extension line.Reinforced energy  conservation, CHF mgmt strategies (emphasis on weighing self). Pt reports urinating in toilet during admission; collab with RN if pt needs to have urine output measured (hat placed in toilet). Pt's daughter present, supportive ?  ? ?Extremity/Trunk Assessment Upper Extremity Assessment ?Upper Extremity Assessment: Overall WFL for tasks assessed ?  ?Lower Extremity Assessment ?Lower Extremity Assessment: Defer to PT evaluation ?  ?  ?  ? ?Vision   ?Vision Assessment?: No apparent visual deficits ?  ?Perception   ?  ?Praxis   ?  ? ?Cognition Arousal/Alertness: Awake/alert ?Behavior During Therapy: [place] for tasks assessed/performed ?Overall Cognitive Status: Within Functional Limits for tasks assessed ?  ?  ?  ?  ?  ?  ?  ?  ?  ?  ?  ?  ?  ?  ?  ?  ?  ?  ?  ?   ?Exercises   ? ?  ?Shoulder Instructions   ? ? ?  ?General Comments pulse ox malfunctioning and unable to read. pt reports staff have been placing to assess vitals and then removed. educated on need for consistent monitoring to titrate as appropriate if O2 reading high as pt says staff reports when assessed.  ? ? ?Pertinent Vitals/ Pain       Pain Assessment ?Pain Assessment: No/denies pain ? ?Home Living   ?  ?  ?  ?  ?  ?  ?  ?  ?  ?  ?  ?  ?  ?  ?  ?  ?  ?  ? ?  ?Prior Functioning/Environment    ?  ?  ?  ?   ? ?Frequency ? Min 2X/week  ? ? ? ? ?  ?Progress Toward Goals ? ?OT Goals(current goals can now be found in the care plan section) ? Progress towards OT goals: Progressing toward goals;Goals met/education completed, patient discharged from OT ? ?Acute Rehab OT Goals ?Patient Stated Goal: go home soon; decrease SOB ?OT Goal Formulation: With patient ?Time For Goal Achievement: 01/09/22 ?Potential to Achieve Goals: Good ?ADL Goals ?Additional ADL Goal #1: Pt to increase activity tolerance > 10 min during functional tasks with no more than one rest break to maximize endurance ?Additional ADL Goal #2: Pt to verbalize 3 energy conservation strategies to  implement during ADLs/IADLs/mobility ?Additional ADL Goal #3: Pt to demonstrate ability to gather ADL/IADL items MOD I without LOB or safety concerns  ?Plan Discharge plan remains appropriate;All goals met and education completed, patient discharged from OT services   ? ?Co-evaluation ? ? ?   ?  ?  ?  ?  ? ?  ?AM-PAC OT "6 Clicks" Daily Activity     ?Outcome Measure ? ? Help from another person eating meals?: None ?Help from another person taking care of personal grooming?: None ?Help from another person toileting, which includes using toliet, bedpan, or urinal?: None ?Help from another person bathing (including washing, rinsing, drying)?: A Little ?Help from another person to put on and taking off regular upper body clothing?: None ?Help from another person to put on and taking off regular lower body clothing?: None ?6 Click Score: 23 ? ?  ?End of Session Equipment Utilized During Treatment: Rolling walker (2 wheels);Oxygen ? ?OT Visit Diagnosis: Other (comment) (decreased  cardiopulmonary tolerance) ?  ?Activity Tolerance Patient tolerated treatment well ?  ?Patient Left in chair;with call bell/phone within reach;with family/visitor present ?  ?Nurse Communication Mobility status ?  ? ?   ? ?Time: 0722-0749 ?OT Time Calculation (min): 27 min ? ?Charges: OT General Charges ?$OT Visit: 1 Visit ?OT Treatments ?$Self Care/Home Management : 23-37 mins ? ?Malachy Chamber, OTR/L ?Acute Rehab Services ?Office: [phone]  ? ?Layla Maw ?12/28/2021, 8:04 AM ?

## 2021-12-28 NOTE — Progress Notes (Signed)
Mobility Specialist Progress Note ? ? 12/28/21 1500  ?Mobility  ?Activity Ambulated with assistance in hallway  ?Level of Assistance Contact guard assist, steadying assist  ?Assistive Device Front wheel walker  ?Distance Ambulated (ft) 220 ft  ?Activity Response Tolerated well  ?$Mobility charge 1 Mobility  ? ?Received pt in bed on 2LO2 having no complaints and agreeable. X1 standing rest break d/t SOB during ambulation. Pt returned to the BR where they felt like SOB was getting worse, SpO2 sitting at 86%. Momentarily increase pt to 4LO2 per advisement of RN and pt sat at 96% SpO2. After a successful void. Pt returned back to bed placed on 3LO2 per advisement and call bell was placed by side.  ? ?Pre Mobility: 76 HR, 94% SpO2 on 2LO2 ?During Mobility: 94 HR, 96% SpO2 on 4LO2  ?Post Mobility: 79 HR, 98% SpO2 on 3LO2 ? ?Holland Falling ?Mobility Specialist ?Phone Number [phone] ? ?

## 2021-12-28 NOTE — Progress Notes (Signed)
PROGRESS NOTE    John [name]  TKP:[id number] DOB: 20-Jul-1941 DOA: 12/24/2021 PCP: [name], DO   Brief Narrative:  John [name] is a 81 yo M with PMH/o DMII, OSA, dCHF, BPH, CAD, HTN, HLD CVA, PVD presented with shortness of breath.  Symptoms appear to be acute onset only a few hours prior to admission, requiring upwards of 15 L nonrebreather with sats in the low 90s with questionable cough but denies fevers chills or chest pain.   Assessment & Plan:   Principal Problem:   NSTEMI (non-ST elevated myocardial infarction) ([place]) Active Problems:   Acute exacerbation of CHF (congestive heart failure) (HCC)   Acute respiratory failure with hypoxia and hypercapnia (HCC)   Hypoxia   Essential hypertension, malignant   D-dimer, elevated   CAD (coronary artery disease) of artery bypass graft   Cardiomyopathy, ischemic   Mixed hyperlipidemia   Peripheral vascular disease (HCC)   Coronary atherosclerosis of native coronary artery   Elevated troponin   Obstructive sleep apnea   BPH (benign prostatic hyperplasia)   CKD (chronic kidney disease), stage III ([place])   Diabetic neuropathy (HCC)  NSTEMI (non-ST elevated myocardial infarction) [facility]) Cardiology following, evaluation limited by AKI as below Discontinue nitro -start Isordil and hydralazine Plan to discontinue heparin 12/28/2021 Will follow outpatient for ischemic evaluation and cath 3/7  Echo with EF 35% with global hypokinesis.  Grade 2 diastolic dysfunction.  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 - Follow CIWA protocol   D-dimer, elevated - Likely acute phase reactant - V/Q negative - Shortness with hypoxia most likely due to CHF exacerbation, congestion, pneumonia, - We will monitor closely   Diabetic neuropathy (HCC) - Stable currently not on any medication - Strict glycemic control  BPH (benign prostatic hyperplasia) - Monitoring, PSA - Continue Flomax  Obstructive sleep apnea - Noncompliant with CPAP at home    Peripheral vascular disease (HCC) - Continue aspirin, continue statins   Mixed hyperlipidemia - Continue Crestor  DVT prophylaxis: Heparin drip Code Status: Full Family Communication: At bedside  Status is: Inpatient  Dispo: The patient is from: Home              Anticipated d/c is to: To be determined, likely SNF              Anticipated d/c date is: 40 to 72 hours pending clinical course and renal recovery              Patient currently not medically stable for discharge  Consultants:  Cardiology, nephrology  Procedures:  None  Antimicrobials:  None  Subjective: Acute issues or events overnight  Objective: Vitals:   12/27/21 2023 12/27/21 2301 12/27/21 2318 12/28/21 0438  BP: (!) 152/82  140/65 Marland Kitchen)  133/58  Pulse: 76  66 65  Resp:  12 12 15   Temp: 98.1 F (36.7 C)  98 F (36.7 C) 97.6 F (36.4 C)  TempSrc: Oral  Oral Oral  SpO2: 96%  100% 98%  Weight:    90.5 kg  Height:        Intake/Output Summary (Last 24 hours) at 12/28/2021 0742 Last data filed at 12/28/2021 5465 Gross per 24 hour  Intake 991.02 ml  Output 745 ml  Net 246.02 ml     Filed Weights   12/26/21 0432 12/27/21 0432 12/28/21 0438  Weight: 91.8 kg 90.1 kg 90.5 kg    Examination:  General:  Pleasantly resting in bed, No acute distress. HEENT:  Normocephalic atraumatic.  Sclerae nonicteric, noninjected.  Extraocular movements intact bilaterally. Neck:  Without mass or deformity.  Trachea is midline. Lungs:  Clear to auscultate bilaterally without rhonchi, wheeze, or rales. Heart:  Regular rate and rhythm.  Without murmurs, rubs, or gallops. Abdomen:  Soft, nontender, nondistended.  Without guarding or rebound.  Data Reviewed: I have personally reviewed following labs and imaging studies  CBC: Recent Labs  Lab 12/24/21 0419 12/25/21 0122 12/26/21 0559 12/27/21 0123 12/28/21 0148  WBC 13.3* 11.9* 10.5 11.2* 9.8  HGB 14.9 12.4* 11.7* 10.7* 11.5*  HCT 48.2 38.4* 36.6* 34.3* 35.1*  MCV 94.9 91.4 90.8 91.2 90.9  PLT 179 133* 133* 126* 129*    Basic Metabolic Panel: Recent Labs  Lab 12/24/21 2008 12/25/21 0122 12/25/21 1052 12/26/21 0559 12/26/21 0619 12/27/21 0123 12/28/21 0148  NA  --  138 135 135  --  133* 135  K  --  4.3 5.0 4.9  --  4.7 4.5  CL  --  103 99 97*  --  98 100  CO2  --  23 23 23   --  25 26  GLUCOSE  --  75 196* 234*  --  192* 160*  BUN  --  51* 57* 77*  --  91* 94*  CREATININE  --  3.37* 3.54* 3.55*  --  3.79* 3.31*  CALCIUM  --  8.6* 8.7* 9.0  --  8.7* 8.7*  MG 2.2  --  2.2  --  2.3  --   --   PHOS  --   --   --  6.2*  --  5.5*  --     GFR: Estimated Creatinine Clearance: 18.1 mL/min (A) (by C-G formula based on SCr of 3.31 mg/dL (H)). Liver Function Tests: Recent Labs  Lab 12/24/21 0419 12/26/21 0559  AST 32  --   ALT 25  --   ALKPHOS 74  --   BILITOT 0.9  --   PROT 8.4*  --   ALBUMIN 4.2 3.3*    No results for input(s): LIPASE, AMYLASE in the last 168 hours. No results for input(s): AMMONIA in the last 168 hours. Coagulation Profile: Recent Labs  Lab 12/25/21 0122  INR 1.1    Cardiac  Enzymes: No results for input(s): CKTOTAL, CKMB, CKMBINDEX, TROPONINI in the last 168 hours. BNP (last 3 results) No results for input(s): PROBNP in the last 8760 hours. HbA1C: No results for input(s): HGBA1C in the last 72 hours.  CBG: Recent Labs  Lab 12/27/21 0613 12/27/21 1104 12/27/21 1603 12/27/21 2125 12/28/21 0608  GLUCAP 195* 234* 254* 178* 99    Lipid Profile: No results for input(s): CHOL, HDL, LDLCALC, TRIG, CHOLHDL, LDLDIRECT in the last 72 hours.  Thyroid Function Tests: No results for input(s):  TSH, T4TOTAL, FREET4, T3FREE, THYROIDAB in the last 72 hours.  Anemia Panel: No results for input(s): VITAMINB12, FOLATE, FERRITIN, TIBC, IRON, RETICCTPCT in the last 72 hours. Sepsis Labs: Recent Labs  Lab 12/24/21 0419 12/24/21 0635  LATICACIDVEN 2.1* 1.0     Recent Results (from the past 240 hour(s))  Resp Panel by RT-PCR (Flu A&B, Covid) Nasopharyngeal Swab     Status: None   Collection Time: 12/24/21  4:27 AM   Specimen: Nasopharyngeal Swab; Nasopharyngeal(NP) swabs in vial transport medium  Result Value Ref Range Status   SARS Coronavirus 2 by RT PCR NEGATIVE NEGATIVE Final    Comment: (NOTE) SARS-CoV-2 target nucleic acids are NOT DETECTED.  The SARS-CoV-2 RNA is generally detectable in upper respiratory specimens during the acute phase of infection. The lowest concentration of SARS-CoV-2 viral copies this assay can detect is 138 copies/mL. A negative result does not preclude SARS-Cov-2 infection and should not be used as the sole basis for treatment or other patient management decisions. A negative result may occur with  improper specimen collection/handling, submission of specimen other than nasopharyngeal swab, presence of viral mutation(s) within the areas targeted by this assay, and inadequate number of viral copies(<138 copies/mL). A negative result must be combined with clinical observations, patient history, and epidemiological information.  The expected result is Negative.  Fact Sheet for Patients:  EntrepreneurPulse.com.au  Fact Sheet for Healthcare Providers:  IncredibleEmployment.be  This test is no t yet approved or cleared by the [place] FDA and  has been authorized for detection and/or diagnosis of SARS-CoV-2 by FDA under an Emergency Use Authorization (EUA). This EUA will remain  in effect (meaning this test can be used) for the duration of the COVID-19 declaration under Section 564(b)(1) of the Act, 21 U.S.C.section 360bbb-3(b)(1), unless the authorization is terminated  or revoked sooner.       Influenza A by PCR NEGATIVE NEGATIVE Final   Influenza B by PCR NEGATIVE NEGATIVE Final    Comment: (NOTE) The Xpert Xpress SARS-CoV-2/FLU/RSV plus assay is intended as an aid in the diagnosis of influenza from Nasopharyngeal swab specimens and should not be used as a sole basis for treatment. Nasal washings and aspirates are unacceptable for Xpert Xpress SARS-CoV-2/FLU/RSV testing.  Fact Sheet for Patients: EntrepreneurPulse.com.au  Fact Sheet for Healthcare Providers: IncredibleEmployment.be  This test is not yet approved or cleared by the [place] FDA and has been authorized for detection and/or diagnosis of SARS-CoV-2 by FDA under an Emergency Use Authorization (EUA). This EUA will remain in effect (meaning this test can be used) for the duration of the COVID-19 declaration under Section 564(b)(1) of the Act, 21 U.S.C. section 360bbb-3(b)(1), unless the authorization is terminated or revoked.  Performed at [facility], [address], [place], [place] [address]   Culture, blood (routine x 2)     Status: None (Preliminary result)   Collection Time: 12/24/21  8:05 AM   Specimen: Left Antecubital; Blood  Result Value Ref Range Status   Specimen Description   Final    LEFT ANTECUBITAL BOTTLES DRAWN AEROBIC AND ANAEROBIC    Special Requests Blood Culture adequate volume  Final   Culture   Final    NO GROWTH 3 DAYS Performed at [facility], [address], [place], [place] [address]    Report Status PENDING  Incomplete  Culture, blood (routine x 2)     Status: None (Preliminary result)   Collection Time: 12/24/21  8:07 AM   Specimen: BLOOD RIGHT HAND  Result Value Ref Range Status   Specimen Description   Final    BLOOD RIGHT HAND BOTTLES DRAWN AEROBIC AND ANAEROBIC   Special Requests Blood Culture adequate volume  Final   Culture   Final    NO GROWTH 3 DAYS Performed at [facility], [address] [place], [place] [address]    Report Status PENDING  Incomplete          Radiology Studies: US RENAL  Result Date: 12/26/2021 CLINICAL DATA:  Acute renal injury EXAM: RENAL / URINARY TRACT ULTRASOUND COMPLETE COMPARISON:  12/24/2021 CT FINDINGS: Right Kidney: Renal measurements: 11.3 x 6.0 x 5.0 cm. = volume: 77 mL. 7 cm exophytic lower pole cyst is noted similar to that seen on prior CT examination. Mild increased echogenicity is noted. Left Kidney: Renal measurements: 11.3 x 4.9 x 5.0 cm. = volume: 146 mL. 4.8 cm mid left renal cyst is noted which appears simple in nature. Mild increased echogenicity is noted. Bladder: Compressed by Foley catheter. Other: None. IMPRESSION: Bilateral renal cysts similar to that seen on recent CT examination. No acute abnormality noted. Mild increased echogenicity consistent with medical renal disease. Electronically Signed   By: Inez Catalina M.D.   On: 12/26/2021 20:07        Scheduled Meds:  allopurinol  100 mg Oral Daily   aspirin  81 mg Oral Daily   carvedilol  12.5 mg Oral BID WC   Chlorhexidine Gluconate Cloth  6 each Topical Daily   cholecalciferol  1,000 Units Oral Daily   clopidogrel  75 mg Oral Daily   folic acid  1 mg Oral Daily   gabapentin  300 mg Oral QHS   hydrALAZINE  10 mg Oral BID   insulin aspart  0-20 Units Subcutaneous TID WC   insulin  glargine-yfgn  50 Units Subcutaneous Daily   isosorbide dinitrate  10 mg Oral BID   multivitamin with minerals  1 tablet Oral Daily   pantoprazole  40 mg Oral Daily   predniSONE  50 mg Oral Q breakfast   rosuvastatin  20 mg Oral QPM   sertraline  25 mg Oral QPM   sodium chloride flush  3 mL Intravenous Q12H   sodium chloride flush  3 mL Intravenous Q12H   tamsulosin  0.4 mg Oral QPM   thiamine  100 mg Oral Daily   Or   thiamine  100 mg Intravenous Daily   Continuous Infusions:  sodium chloride Stopped (12/24/21 1700)   levofloxacin (LEVAQUIN) IV 500 mg (12/26/21 1150)     LOS: 4 days   Time spent: 15min  Keylah Darwish C Zniyah Midkiff, DO [facility]  If 7PM-7AM, please contact night-coverage [email or web address]  12/28/2021, 7:42 AM

## 2021-12-29 ENCOUNTER — Other Ambulatory Visit (HOSPITAL_COMMUNITY): Payer: Self-pay

## 2021-12-29 DIAGNOSIS — I739 Peripheral vascular disease, unspecified: Secondary | ICD-10-CM

## 2021-12-29 DIAGNOSIS — I214 Non-ST elevation (NSTEMI) myocardial infarction: Secondary | ICD-10-CM | POA: Diagnosis not present

## 2021-12-29 LAB — BASIC METABOLIC PANEL
Anion gap: 9 (ref 5–15)
BUN: 96 mg/dL — ABNORMAL HIGH (ref 8–23)
CO2: 24 mmol/L (ref 22–32)
Calcium: 8.8 mg/dL — ABNORMAL LOW (ref 8.9–10.3)
Chloride: 103 mmol/L (ref 98–111)
Creatinine, Ser: 3.18 mg/dL — ABNORMAL HIGH (ref 0.61–1.24)
GFR, Estimated: 19 mL/min — ABNORMAL LOW (ref >60)
Glucose, Bld: 92 mg/dL (ref 70–99)
Potassium: 4.3 mmol/L (ref 3.5–5.1)
Sodium: 136 mmol/L (ref 135–145)

## 2021-12-29 LAB — CULTURE, BLOOD (ROUTINE X 2)
Culture: NO GROWTH
Culture: NO GROWTH
Special Requests: ADEQUATE
Special Requests: ADEQUATE

## 2021-12-29 LAB — CBC
HCT: 37 % — ABNORMAL LOW (ref 39.0–52.0)
Hemoglobin: 12.2 g/dL — ABNORMAL LOW (ref 13.0–17.0)
MCH: 29.8 pg (ref 26.0–34.0)
MCHC: 33 g/dL (ref 30.0–36.0)
MCV: 90.2 fL (ref 80.0–100.0)
Platelets: 125 10*3/uL — ABNORMAL LOW (ref 150–400)
RBC: 4.1 MIL/uL — ABNORMAL LOW (ref 4.22–5.81)
RDW: 15.9 % — ABNORMAL HIGH (ref 11.5–15.5)
WBC: 8.4 10*3/uL (ref 4.0–10.5)
nRBC: 0.2 % (ref 0.0–0.2)

## 2021-12-29 LAB — GLUCOSE, CAPILLARY
Glucose-Capillary: 73 mg/dL (ref 70–99)
Glucose-Capillary: 155 mg/dL — ABNORMAL HIGH (ref 70–99)

## 2021-12-29 MED ORDER — THIAMINE HCL 100 MG PO TABS
100.0000 mg | ORAL_TABLET | Freq: Every day | ORAL | 0 refills | Status: DC
  Filled 2021-12-29: qty 30, 30d supply, fill #0

## 2021-12-29 MED ORDER — ROSUVASTATIN CALCIUM 10 MG PO TABS
10.0000 mg | ORAL_TABLET | Freq: Every evening | ORAL | 0 refills | Status: DC
  Filled 2021-12-29: qty 30, 30d supply, fill #0

## 2021-12-29 MED ORDER — PREDNISONE 10 MG PO TABS
ORAL_TABLET | ORAL | 0 refills | Status: AC
  Filled 2021-12-29: qty 30, 12d supply, fill #0

## 2021-12-29 MED ORDER — INSULIN PEN NEEDLE 32G X 4 MM MISC
0 refills | Status: DC
  Filled 2021-12-29: qty 100, 25d supply, fill #0

## 2021-12-29 MED ORDER — HYDROCODONE-ACETAMINOPHEN 5-325 MG PO TABS
1.0000 | ORAL_TABLET | ORAL | 0 refills | Status: DC | PRN
  Filled 2021-12-29: qty 20, 4d supply, fill #0

## 2021-12-29 MED ORDER — CERTAVITE/ANTIOXIDANTS PO TABS
1.0000 | ORAL_TABLET | Freq: Every day | ORAL | 0 refills | Status: DC
  Filled 2021-12-29: qty 30, 30d supply, fill #0

## 2021-12-29 MED ORDER — FOLIC ACID 1 MG PO TABS
1.0000 mg | ORAL_TABLET | Freq: Every day | ORAL | 0 refills | Status: DC
  Filled 2021-12-29: qty 30, 30d supply, fill #0

## 2021-12-29 MED ORDER — HYDRALAZINE HCL 25 MG PO TABS: 37.5000 mg | ORAL_TABLET | Freq: Two times a day (BID) | ORAL | Status: DC

## 2021-12-29 MED ORDER — ROSUVASTATIN CALCIUM 5 MG PO TABS
10.0000 mg | ORAL_TABLET | Freq: Every evening | ORAL | Status: DC
Start: 2021-12-29 — End: 2021-12-29

## 2021-12-29 MED ORDER — GABAPENTIN 300 MG PO CAPS
300.0000 mg | ORAL_CAPSULE | Freq: Every day | ORAL | 0 refills | Status: DC
  Filled 2021-12-29: qty 30, 30d supply, fill #0

## 2021-12-29 MED ORDER — HYDRALAZINE HCL 25 MG PO TABS
25.0000 mg | ORAL_TABLET | Freq: Two times a day (BID) | ORAL | 0 refills | Status: DC
  Filled 2021-12-29: qty 60, 30d supply, fill #0

## 2021-12-29 MED ORDER — HYDROCODONE-ACETAMINOPHEN 5-325 MG PO TABS: 1.0000 | ORAL_TABLET | ORAL | 0 refills | Status: DC | PRN

## 2021-12-29 MED ORDER — APIXABAN 2.5 MG PO TABS
2.5000 mg | ORAL_TABLET | Freq: Two times a day (BID) | ORAL | 0 refills | Status: DC
  Filled 2021-12-29: qty 60, 30d supply, fill #0

## 2021-12-29 MED ORDER — ISOSORBIDE DINITRATE 20 MG PO TABS
20.0000 mg | ORAL_TABLET | Freq: Two times a day (BID) | ORAL | 0 refills | Status: DC
  Filled 2021-12-29: qty 60, 30d supply, fill #0

## 2021-12-29 MED ORDER — CLOPIDOGREL BISULFATE 75 MG PO TABS
75.0000 mg | ORAL_TABLET | Freq: Every day | ORAL | 0 refills | Status: DC
Start: 2021-12-29 — End: 2022-05-21
  Filled 2021-12-29: qty 30, 30d supply, fill #0

## 2021-12-29 MED ORDER — HYDRALAZINE HCL 25 MG PO TABS
37.5000 mg | ORAL_TABLET | Freq: Two times a day (BID) | ORAL | 0 refills | Status: DC
  Filled 2021-12-29: qty 90, 30d supply, fill #0

## 2021-12-29 MED ORDER — LANTUS SOLOSTAR 100 UNIT/ML ~~LOC~~ SOPN
50.0000 [IU] | PEN_INJECTOR | Freq: Every day | SUBCUTANEOUS | 11 refills | Status: DC
  Filled 2021-12-29: qty 15, 30d supply, fill #0

## 2021-12-29 NOTE — Discharge Summary (Addendum)
Physician Discharge Summary  John [name] GUY:[id number] DOB: [birth year] DOA: 12/24/2021  PCP: [name], DO  Admit date: 12/24/2021 Discharge date: 12/29/2021  Admitted From: Home Disposition: Home  Recommendations for Outpatient Follow-up:  Follow up with PCP in 1-2 weeks Please obtain BMP/CBC in one week Please follow up with cardiology and nephrology as scheduled  Home Health: Home health PT Equipment/Devices: None  Discharge Condition: Stable CODE STATUS: Full Diet recommendation: Low-salt low-fat diabetic diet  Brief/Interim Summary: John [name] is a 81 yo M with PMH/o DMII, OSA, dCHF, BPH, CAD, HTN, HLD CVA, PVD presented with shortness of breath.  Symptoms appear to be acute onset only a few hours prior to admission, requiring upwards of 15 L nonrebreather with sats in the low 90s with questionable cough but denies fevers chills or chest pain.  Discharge Diagnoses:  Principal Problem:   NSTEMI (non-ST elevated myocardial infarction) ([place]) Active Problems:   Acute exacerbation of CHF (congestive heart failure) (HCC)   Acute respiratory failure with hypoxia and hypercapnia (HCC)   Hypoxia   Essential hypertension, malignant   D-dimer, elevated   CAD (coronary artery disease) of artery bypass graft   Cardiomyopathy, ischemic   Mixed hyperlipidemia   Peripheral vascular disease (HCC)   Coronary atherosclerosis of native coronary artery   Elevated troponin   Obstructive sleep apnea   BPH (benign prostatic hyperplasia)   CKD (chronic kidney disease), stage III ([place])   Diabetic neuropathy (HCC)  NSTEMI (non-ST elevated myocardial infarction) [facility]) Cardiology following, evaluation limited by AKI as below Will follow outpatient for ischemic evaluation and cath 3/7  Echo with EF 35% with global hypokinesis.  Grade 2 diastolic dysfunction.   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 - Follow CIWA protocol   D-dimer, elevated - Likely acute phase reactant - V/Q negative - Shortness with hypoxia most likely due to CHF exacerbation, congestion, pneumonia, - We will monitor closely   Diabetic neuropathy (HCC) - Stable currently not on any medication - Strict glycemic control   BPH (benign prostatic hyperplasia) - Monitoring, PSA - Continue Flomax   Obstructive sleep apnea - Noncompliant with CPAP at home    Peripheral vascular disease ([place]) - Continue aspirin, continue statins   Mixed hyperlipidemia - Continue Crestor  Discharge Instructions  Discharge Instructions     Discharge patient   Complete by: As directed    Discharge disposition: 06-Home-Health Care Svc   Discharge patient date: 12/29/2021   Face-to-face encounter (required for Medicare/Medicaid patients)   Complete by: As directed    I Little Ishikawa certify that this patient is under my care and that I, or a nurse practitioner or physician's assistant working with me, had a face-to-face encounter that meets the physician face-to-face encounter requirements with this  patient on 12/29/2021. The encounter with the patient was in whole, or in part for the following medical condition(s) which  is the primary reason for home health care (List medical condition): ambulatory dysfunction   The encounter with the patient was in whole, or in part, for the following medical condition, which is the primary reason for home health care: ambulatory dysfunction   I certify that, based on my findings, the following services are medically necessary home health services: Physical therapy   Reason for Medically Necessary Home Health Services:  Skilled Nursing- Change/Decline in Patient Status Therapy- Personnel officer, Transfer Training and Stair Training     My clinical findings support the need for the above services: Unable to leave home safely without assistance and/or assistive device   Further, I certify that my clinical findings support that this patient is homebound due to: Unable to leave home safely without assistance   Home Health   Complete by: As directed    To provide the following care/treatments: PT      Allergies as of 12/29/2021       Reactions   Brilinta [ticagrelor] Shortness Of Breath   Penicillins Hives   Ezetimibe-simvastatin Other (See Comments)   Myalgia        Medication List     STOP taking these medications    aspirin 81 MG tablet   canagliflozin 100 MG Tabs tablet Commonly known as: INVOKANA   candesartan 16 MG tablet Commonly known as: ATACAND   furosemide 40 MG tablet Commonly known as: LASIX   glimepiride 4 MG tablet Commonly known as: AMARYL       TAKE these medications    albuterol 108 (90 Base) MCG/ACT inhaler Commonly known as: VENTOLIN HFA Inhale 2 puffs into the lungs every 6 (six) hours as needed for wheezing or shortness of breath.   allopurinol 300 MG tablet Commonly known as: ZYLOPRIM Take 300 mg by mouth daily.   apixaban 2.5 MG Tabs tablet Commonly known as: ELIQUIS Take 1 tablet (2.5 mg total) by mouth 2 (two) times daily.   carvedilol 12.5 MG tablet Commonly known as: COREG Take 1 tablet (12.5 mg total) by mouth 2  (two) times daily with a meal.   CertaVite/Antioxidants Tabs Take 1 tablet by mouth daily.   cholecalciferol 25 MCG (1000 UNIT) tablet Commonly known as: VITAMIN D3 Take 1,000 Units by mouth daily.   clopidogrel 75 MG tablet Commonly known as: PLAVIX Take 1 tablet (75 mg total) by mouth daily.   folic acid 1 MG tablet Commonly known as: FOLVITE Take 1 tablet (1 mg total) by mouth daily.   gabapentin 300 MG capsule Commonly known as: NEURONTIN Take 1 capsule (300 mg total) by mouth at bedtime. What changed: when to take this   hydrALAZINE 25 MG tablet Commonly known as: APRESOLINE Take 1.5 tablets (37.5 mg total) by mouth 2 (two) times daily.   HYDROcodone-acetaminophen 5-325 MG tablet Commonly known as: NORCO/VICODIN Take 1 tablet by mouth every 4 (four) hours as needed for moderate pain.   Insulin Pen Needle 32G X 4 MM Misc Use to inject insulin up to 4 times daily.   isosorbide dinitrate 20 MG tablet Commonly known as: ISORDIL Take 1 tablet (20 mg total) by mouth 2 (two) times daily.   Lantus SoloStar 100 UNIT/ML Solostar Pen Generic drug: insulin glargine Inject 50 Units into the skin daily. Dose per sliding scale. What changed:  how much to take when to take this   pantoprazole  40 MG tablet Commonly known as: PROTONIX Take 40 mg by mouth daily with breakfast.   predniSONE 10 MG tablet Commonly known as: DELTASONE Take 4 tablets (40 mg total) by mouth daily for 3 days, THEN 3 tablets (30 mg total) daily for 3 days, THEN 2 tablets (20 mg total) daily for 3 days, THEN 1 tablet (10 mg total) daily for 3 days. Start taking on: December 29, 2021   rosuvastatin 10 MG tablet Commonly known as: CRESTOR Take 1 tablet (10 mg total) by mouth every evening. What changed:  medication strength how much to take   senna 8.6 MG tablet Commonly known as: SENOKOT Take 2-3 tablets by mouth daily as needed for constipation.   sertraline 25 MG tablet Commonly known as:  ZOLOFT Take 1 tablet (25 mg total) by mouth every evening.   tamsulosin 0.4 MG Caps capsule Commonly known as: FLOMAX Take 0.4 mg by mouth daily.   thiamine 100 MG tablet Take 1 tablet (100 mg total) by mouth daily. Start taking on: December 30, 2021   VITAMIN B 12 PO Take 1 tablet by mouth daily.               Durable Medical Equipment  (From admission, onward)           Start     Ordered   12/29/21 1035  For home use only DME oxygen  Once       Question Answer Comment  Length of Need 6 Months   Mode or (Route) Nasal cannula   Liters per Minute 4   Frequency Continuous (stationary and portable oxygen unit needed)   Oxygen conserving device No   Oxygen delivery system Gas      12/29/21 1034            Follow-up Information     Lelon Perla, MD Follow up.   Specialty: Cardiology Why: keep your current appt with Dr. [name] on 4/4 at [facility], cardiology scheduler will contact you to add on a sooner visit as well. Contact information: [address] [place] [address] [phone]                Allergies  Allergen Reactions   Brilinta [Ticagrelor] Shortness Of Breath   Penicillins Hives   Ezetimibe-Simvastatin Other (See Comments)    Myalgia     Consultations: Cardiology, nephrology  Procedures/Studies: NM Pulmonary Perfusion  Result Date: 12/24/2021 CLINICAL DATA:  81 year old male with respiratory distress, shortness of breath. EXAM: NUCLEAR MEDICINE PERFUSION LUNG SCAN TECHNIQUE: Perfusion images were obtained in multiple projections after intravenous injection of radiopharmaceutical. Ventilation scans intentionally deferred if perfusion scan and chest x-ray adequate for interpretation during COVID 19 epidemic. RADIOPHARMACEUTICALS:  4.4 mCi Tc-47m MAA IV COMPARISON:  Noncontrast CT Chest, Abdomen, and Pelvis 0627 hours today. FINDINGS: In conjunction with the chest CT appearance earlier today the bilateral pulmonary  perfusion radiotracer activity is fairly homogeneous and within normal limits. No convincing perfusion defect. IMPRESSION: No pulmonary perfusion abnormality, no evidence of pulmonary embolus. Electronically Signed   By: Genevie Ann M.D.   On: 12/24/2021 11:54   US RENAL  Result Date: 12/26/2021 CLINICAL DATA:  Acute renal injury EXAM: RENAL / URINARY TRACT ULTRASOUND COMPLETE COMPARISON:  12/24/2021 CT FINDINGS: Right Kidney: Renal measurements: 11.3 x 6.0 x 5.0 cm. = volume: 77 mL. 7 cm exophytic lower pole cyst is noted similar to that seen on prior CT examination. Mild increased echogenicity is noted. Left Kidney: Renal  measurements: 11.3 x 4.9 x 5.0 cm. = volume: 146 mL. 4.8 cm mid left renal cyst is noted which appears simple in nature. Mild increased echogenicity is noted. Bladder: Compressed by Foley catheter. Other: None. IMPRESSION: Bilateral renal cysts similar to that seen on recent CT examination. No acute abnormality noted. Mild increased echogenicity consistent with medical renal disease. Electronically Signed   By: Inez Catalina M.D.   On: 12/26/2021 20:07   DG Chest Portable 1 View  Result Date: 12/24/2021 CLINICAL DATA:  81 year old male with shortness of breath, respiratory distress. Former smoker. EXAM: PORTABLE CHEST 1 VIEW COMPARISON:  Portable chest 09/13/2021 and earlier. FINDINGS: Portable AP semi upright view at 0432 hours. Stable lung volumes from last year. Cardiac silhouette appears mildly increased. Coronary artery stent is visible. Calcified aortic atherosclerosis. Other mediastinal contours are within normal limits. Visualized tracheal air column is within normal limits. Chronic bilateral increased interstitial markings demonstrated last year, but superimposed acute basilar predominant increased pulmonary interstitial opacity and trace new pleural fluid in the right minor fissure. No pneumothorax, layering pleural effusion, or consolidation identified. No acute osseous abnormality  identified. Partially visible gastric distension in the upper abdomen. IMPRESSION: 1. Acute on chronic pulmonary interstitial opacity with trace pleural fluid in the right minor fissure most compatible with acute pulmonary edema. 2. Mildly increased cardiomegaly from last year. 3. Gas distended stomach.  NG tube decompression might be valuable. Electronically Signed   By: Genevie Ann M.D.   On: 12/24/2021 04:47   ECHOCARDIOGRAM COMPLETE  Result Date: 12/25/2021    ECHOCARDIOGRAM REPORT   Patient Name:   MIKE HAMRE Date of Exam: 12/25/2021 Medical Rec #:  [id number]     Height:       66.0 in Accession #:    [id number]    Weight:       202.4 lb Date of Birth:  [birth year]      BSA:          2.010 m Patient Age:    22 years      BP:           122/67 mmHg Patient Gender: M             HR:           85 bpm. Exam Location:  Inpatient Procedure: 2D Echo, Cardiac Doppler, Color Doppler and Intracardiac            Opacification Agent Indications:    NSTEMI  History:        Patient has prior history of Echocardiogram examinations. CHF,                 CAD; Risk Factors:Hypertension and Sleep Apnea.  Sonographer:    Jyl Heinz Referring Phys: [phone] SEYED A SHAHMEHDI IMPRESSIONS  1. Left ventricular ejection fraction, by estimation, is 35%. The left ventricle has moderately decreased function. The left ventricle demonstrates global hypokinesis. The left ventricular internal cavity size was mildly dilated. There is mild left ventricular hypertrophy. Left ventricular diastolic parameters are consistent with Grade II diastolic dysfunction (pseudonormalization).  2. Right ventricular systolic function is normal. The right ventricular size is normal. Tricuspid regurgitation signal is inadequate for assessing PA pressure.  3. The mitral valve is normal in structure. Trivial mitral valve regurgitation. No evidence of mitral stenosis.  4. The aortic valve is tricuspid. There is mild calcification of the aortic valve. Aortic valve  regurgitation is not visualized. No aortic stenosis is present. FINDINGS  Left Ventricle: Left  ventricular ejection fraction, by estimation, is 35%. The left ventricle has moderately decreased function. The left ventricle demonstrates global hypokinesis. The left ventricular internal cavity size was mildly dilated. There is mild left ventricular hypertrophy. Left ventricular diastolic parameters are consistent with Grade II diastolic dysfunction (pseudonormalization). Right Ventricle: The right ventricular size is normal. No increase in right ventricular wall thickness. Right ventricular systolic function is normal. Tricuspid regurgitation signal is inadequate for assessing PA pressure. Left Atrium: Left atrial size was normal in size. Right Atrium: Right atrial size was normal in size. Pericardium: Trivial pericardial effusion is present. Mitral Valve: The mitral valve is normal in structure. There is mild calcification of the mitral valve leaflet(s). Mild mitral annular calcification. Trivial mitral valve regurgitation. No evidence of mitral valve stenosis. Tricuspid Valve: The tricuspid valve is normal in structure. Tricuspid valve regurgitation is not demonstrated. Aortic Valve: The aortic valve is tricuspid. There is mild calcification of the aortic valve. Aortic valve regurgitation is not visualized. No aortic stenosis is present. Aortic valve peak gradient measures 8.1 mmHg. Pulmonic Valve: The pulmonic valve was normal in structure. Pulmonic valve regurgitation is not visualized. Aorta: The aortic root is normal in size and structure. IAS/Shunts: No atrial level shunt detected by color flow Doppler.  LEFT VENTRICLE PLAX 2D LVIDd:         5.60 cm      Diastology LVIDs:         4.20 cm      LV e' medial:    3.92 cm/s LV PW:         1.20 cm      LV E/e' medial:  26.0 LV IVS:        1.20 cm      LV e' lateral:   4.68 cm/s LVOT diam:     2.00 cm      LV E/e' lateral: 21.8 LV SV:         52 LV SV Index:   26 LVOT  Area:     3.14 cm  LV Volumes (MOD) LV vol d, MOD A2C: 141.0 ml LV vol d, MOD A4C: 121.0 ml LV vol s, MOD A2C: 85.3 ml LV vol s, MOD A4C: 68.1 ml LV SV MOD A2C:     55.7 ml LV SV MOD A4C:     121.0 ml LV SV MOD BP:      56.8 ml RIGHT VENTRICLE RV Basal diam:  2.60 cm RV Mid diam:    1.70 cm RV S prime:     10.90 cm/s TAPSE (M-mode): 2.4 cm LEFT ATRIUM             Index        RIGHT ATRIUM           Index LA diam:        4.40 cm 2.19 cm/m   RA Area:     13.50 cm LA Vol (A2C):   54.5 ml 27.12 ml/m  RA Volume:   28.60 ml  14.23 ml/m LA Vol (A4C):   43.1 ml 21.44 ml/m LA Biplane Vol: 49.5 ml 24.63 ml/m  AORTIC VALVE AV Area (Vmax): 1.81 cm AV Vmax:        142.00 cm/s AV Peak Grad:   8.1 mmHg LVOT Vmax:      81.60 cm/s LVOT Vmean:     63.600 cm/s LVOT VTI:       0.165 m  AORTA Ao Root diam: 3.10 cm Ao Asc diam:  3.00  cm MITRAL VALVE MV Area (PHT): 4.77 cm     SHUNTS MV Decel Time: 159 msec     Systemic VTI:  0.16 m MV E velocity: 102.00 cm/s  Systemic Diam: 2.00 cm MV A velocity: 72.00 cm/s MV E/A ratio:  1.42 Dalton McleanMD Electronically signed by Franki Monte Signature Date/Time: 12/25/2021/5:52:41 PM    Final    CT CHEST ABDOMEN PELVIS WO CONTRAST  Result Date: 12/24/2021 CLINICAL DATA:  81 year old male with history of respiratory distress. Abdominal pain and abdominal distension. EXAM: CT CHEST, ABDOMEN AND PELVIS WITHOUT CONTRAST TECHNIQUE: Multidetector CT imaging of the chest, abdomen and pelvis was performed following the standard protocol without IV contrast. RADIATION DOSE REDUCTION: This exam was performed according to the departmental dose-optimization program which includes automated exposure control, adjustment of the mA and/or kV according to patient size and/or use of iterative reconstruction technique. COMPARISON:  No priors. FINDINGS: CT CHEST FINDINGS Cardiovascular: Heart size is normal. There is no significant pericardial fluid, thickening or pericardial calcification. There is aortic  atherosclerosis, as well as atherosclerosis of the great vessels of the mediastinum and the coronary arteries, including calcified atherosclerotic plaque in the left main, left anterior descending, left circumflex and right coronary arteries. Mild calcifications of the aortic valve. Mediastinum/Nodes: Mediastinal or no pathologically enlarged hilar lymph nodes. Esophagus is unremarkable in appearance. No axillary lymphadenopathy. Lungs/Pleura: Small bilateral pleural effusions lying dependently. Widespread areas of ground-glass attenuation and septal thickening noted in the lungs bilaterally, with areas of peribronchovascular airspace consolidation scattered throughout the lungs, most evident in the periphery of the right lower lobe and right middle lobe. Musculoskeletal: There are no aggressive appearing lytic or blastic lesions noted in the visualized portions of the skeleton. CT ABDOMEN PELVIS FINDINGS Hepatobiliary: Diffuse low attenuation throughout the hepatic parenchyma, indicative of a background of hepatic steatosis. Several small low-attenuation lesions are scattered throughout the liver, largest of which measures up to 1.3 cm in segment 4A, incompletely characterized on today's non-contrast CT examination, but statistically likely to represent small cysts. 6 mm calcified gallstone in the fundus of the gallbladder. Gallbladder is otherwise unremarkable in appearance. Pancreas: No definite pancreatic mass or peripancreatic fluid collections or inflammatory changes are noted on today's noncontrast CT examination. Spleen: Unremarkable. Adrenals/Urinary Tract: Low-attenuation lesions in both kidneys, incompletely characterized on today's non-contrast CT examination, but statistically likely to represent cysts, largest of which is exophytic extending off the lower pole of the right kidney measuring 7.5 cm in diameter. Mild right and moderate left renal atrophy. No hydroureteronephrosis. Urinary bladder is  nearly completely decompressed with a Foley balloon catheter in place. Gas non dependently in the lumen of the urinary bladder is presumably iatrogenic. Bilateral adrenal glands are normal in appearance. Stomach/Bowel: The unenhanced appearance of the stomach is normal. There is no pathologic dilatation of small bowel or colon. Normal appendix. Vascular/Lymphatic: Aortic atherosclerosis. No lymphadenopathy noted in the abdomen or pelvis. Reproductive: Prostate gland and seminal vesicles are unremarkable in appearance. Other: No significant volume of ascites.  No pneumoperitoneum. Musculoskeletal: There are no aggressive appearing lytic or blastic lesions noted in the visualized portions of the skeleton. IMPRESSION: 1. The appearance the chest is concerning for severe multilobar bilateral bronchopneumonia. 2. Small bilateral pleural effusions lying dependently. 3. No acute findings are noted in the abdomen or pelvis. 4. Aortic atherosclerosis, in addition to left main and three-vessel coronary artery disease. 5. Cholelithiasis without evidence of acute cholecystitis at this time. 6. Additional incidental findings, as above. Electronically Signed  By: Vinnie Langton M.D.   On: 12/24/2021 06:52     Subjective: No acute issues or events overnight   Discharge Exam: Vitals:   12/29/21 0713 12/29/21 1232  BP: (!) 148/83 (!) 146/57  Pulse:  75  Resp: 14 18  Temp: 97.6 F (36.4 C) 98 F (36.7 C)  SpO2: 93% 94%   Vitals:   12/29/21 0351 12/29/21 0355 12/29/21 0713 12/29/21 1232  BP: (!) 158/89  (!) 148/83 (!) 146/57  Pulse: 100   75  Resp: 15  14 18   Temp: 97.6 F (36.4 C)  97.6 F (36.4 C) 98 F (36.7 C)  TempSrc: Oral  Oral Oral  SpO2: 94%  93% 94%  Weight:  89.3 kg    Height:        General: Pt is alert, awake, not in acute distress Cardiovascular: RRR, S1/S2 +, no rubs, no gallops Respiratory: CTA bilaterally, no wheezing, no rhonchi Abdominal: Soft, NT, ND, bowel sounds  + Extremities: no edema, no cyanosis    The results of significant diagnostics from this hospitalization (including imaging, microbiology, ancillary and laboratory) are listed below for reference.     Microbiology: Recent Results (from the past 240 hour(s))  Resp Panel by RT-PCR (Flu A&B, Covid) Nasopharyngeal Swab     Status: None   Collection Time: 12/24/21  4:27 AM   Specimen: Nasopharyngeal Swab; Nasopharyngeal(NP) swabs in vial transport medium  Result Value Ref Range Status   SARS Coronavirus 2 by RT PCR NEGATIVE NEGATIVE Final    Comment: (NOTE) SARS-CoV-2 target nucleic acids are NOT DETECTED.  The SARS-CoV-2 RNA is generally detectable in upper respiratory specimens during the acute phase of infection. The lowest concentration of SARS-CoV-2 viral copies this assay can detect is 138 copies/mL. A negative result does not preclude SARS-Cov-2 infection and should not be used as the sole basis for treatment or other patient management decisions. A negative result may occur with  improper specimen collection/handling, submission of specimen other than nasopharyngeal swab, presence of viral mutation(s) within the areas targeted by this assay, and inadequate number of viral copies(<138 copies/mL). A negative result must be combined with clinical observations, patient history, and epidemiological information. The expected result is Negative.  Fact Sheet for Patients:  EntrepreneurPulse.com.au  Fact Sheet for Healthcare Providers:  IncredibleEmployment.be  This test is no t yet approved or cleared by the [place] FDA and  has been authorized for detection and/or diagnosis of SARS-CoV-2 by FDA under an Emergency Use Authorization (EUA). This EUA will remain  in effect (meaning this test can be used) for the duration of the COVID-19 declaration under Section 564(b)(1) of the Act, 21 U.S.C.section 360bbb-3(b)(1), unless the authorization  is terminated  or revoked sooner.       Influenza A by PCR NEGATIVE NEGATIVE Final   Influenza B by PCR NEGATIVE NEGATIVE Final    Comment: (NOTE) The Xpert Xpress SARS-CoV-2/FLU/RSV plus assay is intended as an aid in the diagnosis of influenza from Nasopharyngeal swab specimens and should not be used as a sole basis for treatment. Nasal washings and aspirates are unacceptable for Xpert Xpress SARS-CoV-2/FLU/RSV testing.  Fact Sheet for Patients: EntrepreneurPulse.com.au  Fact Sheet for Healthcare Providers: IncredibleEmployment.be  This test is not yet approved or cleared by the [place] FDA and has been authorized for detection and/or diagnosis of SARS-CoV-2 by FDA under an Emergency Use Authorization (EUA). This EUA will remain in effect (meaning this test can be used) for the duration of the  COVID-19 declaration under Section 564(b)(1) of the Act, 21 U.S.C. section 360bbb-3(b)(1), unless the authorization is terminated or revoked.  Performed at [facility], [address] [place], [place] [address]   Culture, blood (routine x 2)     Status: None   Collection Time: 12/24/21  8:05 AM   Specimen: Left Antecubital; Blood  Result Value Ref Range Status   Specimen Description   Final    LEFT ANTECUBITAL BOTTLES DRAWN AEROBIC AND ANAEROBIC   Special Requests Blood Culture adequate volume  Final   Culture   Final    NO GROWTH 5 DAYS Performed at [facility], [address], [place], [place] [address]    Report Status 12/29/2021 FINAL  Final  Culture, blood (routine x 2)     Status: None   Collection Time: 12/24/21  8:07 AM   Specimen: BLOOD RIGHT HAND  Result Value Ref Range Status   Specimen Description   Final    BLOOD RIGHT HAND BOTTLES DRAWN AEROBIC AND ANAEROBIC   Special Requests Blood Culture adequate volume  Final   Culture   Final    NO GROWTH 5 DAYS Performed at [facility], [address], [place], [place] [address]     Report Status 12/29/2021 FINAL  Final     Labs: BNP (last 3 results) Recent Labs    12/25/21 0122 12/26/21 0559 12/27/21 0123  BNP 1,009.5* 885.5* 720.9*   Basic Metabolic Panel: Recent Labs  Lab 12/24/21 2008 12/25/21 0122 12/25/21 1052 12/26/21 0559 12/26/21 0619 12/27/21 0123 12/28/21 0148 12/29/21 0400  NA  --    < > 135 135  --  133* 135 136  K  --    < > 5.0 4.9  --  4.7 4.5 4.3  CL  --    < > 99 97*  --  98 100 103  CO2  --    < > 23 23  --  25 26 24   GLUCOSE  --    < > 196* 234*  --  192* 160* 92  BUN  --    < > 57* 77*  --  91* 94* 96*  CREATININE  --    < > 3.54* 3.55*  --  3.79* 3.31* 3.18*  CALCIUM  --    < > 8.7* 9.0  --  8.7* 8.7* 8.8*  MG 2.2  --  2.2  --  2.3  --   --   --   PHOS  --   --   --  6.2*  --  5.5*  --   --    < > = values in this interval not displayed.   Liver Function Tests: Recent Labs  Lab 12/24/21 0419 12/26/21 0559  AST 32  --   ALT 25  --   ALKPHOS 74  --   BILITOT 0.9  --   PROT 8.4*  --   ALBUMIN 4.2 3.3*   No results for input(s): LIPASE, AMYLASE in the last 168 hours. No results for input(s): AMMONIA in the last 168 hours. CBC: Recent Labs  Lab 12/25/21 0122 12/26/21 0559 12/27/21 0123 12/28/21 0148 12/29/21 0400  WBC 11.9* 10.5 11.2* 9.8 8.4  HGB 12.4* 11.7* 10.7* 11.5* 12.2*  HCT 38.4* 36.6* 34.3* 35.1* 37.0*  MCV 91.4 90.8 91.2 90.9 90.2  PLT 133* 133* 126* 129* 125*   Cardiac Enzymes: No results for input(s): CKTOTAL, CKMB, CKMBINDEX, TROPONINI in the last 168 hours. BNP: Invalid input(s): POCBNP CBG: Recent  Labs  Lab 12/28/21 1142 12/28/21 1645 12/28/21 2150 12/29/21 0609 12/29/21 1228  GLUCAP 139* 179* 189* 73 155*   D-Dimer No results for input(s): DDIMER in the last 72 hours. Hgb A1c No results for input(s): HGBA1C in the last 72 hours. Lipid Profile No results for input(s): CHOL, HDL, LDLCALC, TRIG, CHOLHDL, LDLDIRECT in the last 72 hours. Thyroid function studies No results for  input(s): TSH, T4TOTAL, T3FREE, THYROIDAB in the last 72 hours.  Invalid input(s): FREET3 Anemia work up No results for input(s): VITAMINB12, FOLATE, FERRITIN, TIBC, IRON, RETICCTPCT in the last 72 hours. Urinalysis    Component Value Date/Time   COLORURINE YELLOW 12/26/2021 1825   APPEARANCEUR HAZY (A) 12/26/2021 1825   LABSPEC 1.015 12/26/2021 1825   PHURINE 5.0 12/26/2021 1825   GLUCOSEU >=500 (A) 12/26/2021 1825   HGBUR MODERATE (A) 12/26/2021 1825   BILIRUBINUR NEGATIVE 12/26/2021 1825   KETONESUR NEGATIVE 12/26/2021 1825   PROTEINUR 30 (A) 12/26/2021 1825   NITRITE NEGATIVE 12/26/2021 1825   LEUKOCYTESUR TRACE (A) 12/26/2021 1825   Sepsis Labs Invalid input(s): PROCALCITONIN,  WBC,  LACTICIDVEN Microbiology Recent Results (from the past 240 hour(s))  Resp Panel by RT-PCR (Flu A&B, Covid) Nasopharyngeal Swab     Status: None   Collection Time: 12/24/21  4:27 AM   Specimen: Nasopharyngeal Swab; Nasopharyngeal(NP) swabs in vial transport medium  Result Value Ref Range Status   SARS Coronavirus 2 by RT PCR NEGATIVE NEGATIVE Final    Comment: (NOTE) SARS-CoV-2 target nucleic acids are NOT DETECTED.  The SARS-CoV-2 RNA is generally detectable in upper respiratory specimens during the acute phase of infection. The lowest concentration of SARS-CoV-2 viral copies this assay can detect is 138 copies/mL. A negative result does not preclude SARS-Cov-2 infection and should not be used as the sole basis for treatment or other patient management decisions. A negative result may occur with  improper specimen collection/handling, submission of specimen other than nasopharyngeal swab, presence of viral mutation(s) within the areas targeted by this assay, and inadequate number of viral copies(<138 copies/mL). A negative result must be combined with clinical observations, patient history, and epidemiological information. The expected result is Negative.  Fact Sheet for Patients:   EntrepreneurPulse.com.au  Fact Sheet for Healthcare Providers:  IncredibleEmployment.be  This test is no t yet approved or cleared by the [place] FDA and  has been authorized for detection and/or diagnosis of SARS-CoV-2 by FDA under an Emergency Use Authorization (EUA). This EUA will remain  in effect (meaning this test can be used) for the duration of the COVID-19 declaration under Section 564(b)(1) of the Act, 21 U.S.C.section 360bbb-3(b)(1), unless the authorization is terminated  or revoked sooner.       Influenza A by PCR NEGATIVE NEGATIVE Final   Influenza B by PCR NEGATIVE NEGATIVE Final    Comment: (NOTE) The Xpert Xpress SARS-CoV-2/FLU/RSV plus assay is intended as an aid in the diagnosis of influenza from Nasopharyngeal swab specimens and should not be used as a sole basis for treatment. Nasal washings and aspirates are unacceptable for Xpert Xpress SARS-CoV-2/FLU/RSV testing.  Fact Sheet for Patients: EntrepreneurPulse.com.au  Fact Sheet for Healthcare Providers: IncredibleEmployment.be  This test is not yet approved or cleared by the [place] FDA and has been authorized for detection and/or diagnosis of SARS-CoV-2 by FDA under an Emergency Use Authorization (EUA). This EUA will remain in effect (meaning this test can be used) for the duration of the COVID-19 declaration under Section 564(b)(1) of the Act, 21 U.S.C.  section 360bbb-3(b)(1), unless the authorization is terminated or revoked.  Performed at [facility], [address], [place], [place] [address]   Culture, blood (routine x 2)     Status: None   Collection Time: 12/24/21  8:05 AM   Specimen: Left Antecubital; Blood  Result Value Ref Range Status   Specimen Description   Final    LEFT ANTECUBITAL BOTTLES DRAWN AEROBIC AND ANAEROBIC   Special Requests Blood Culture adequate volume  Final   Culture   Final    NO  GROWTH 5 DAYS Performed at [facility], [address] [place], [place] [address]    Report Status 12/29/2021 FINAL  Final  Culture, blood (routine x 2)     Status: None   Collection Time: 12/24/21  8:07 AM   Specimen: BLOOD RIGHT HAND  Result Value Ref Range Status   Specimen Description   Final    BLOOD RIGHT HAND BOTTLES DRAWN AEROBIC AND ANAEROBIC   Special Requests Blood Culture adequate volume  Final   Culture   Final    NO GROWTH 5 DAYS Performed at [facility], [address] [place], [place] [address]    Report Status 12/29/2021 FINAL  Final     Time coordinating discharge: Over 30 minutes  SIGNED:   Little Ishikawa, DO [facility] 12/29/2021, 1:54 PM Pager   If 7PM-7AM, please contact night-coverage [email or web address]

## 2021-12-29 NOTE — Care Management Important Message (Signed)
Important Message ? ?Patient Details  ?Name: John [name] ?MRN: [id number] ?Date of Birth: Apr 28, 1941 ? ? ?Medicare Important Message Given:  Yes ? ? ? ? ?[name] ?12/29/2021, 9:32 AM ?

## 2021-12-29 NOTE — TOC Transition Note (Addendum)
Transition of Care (TOC) - CM/SW Discharge Note ?Marvetta Gibbons Therapist, sports, BSN ?Transitions of Care ?Unit 4E- RN Case Manager ?See Treatment Team for direct phone #  ? ? ?Patient Details  ?Name: John [name] ?MRN: [id number] ?Date of Birth: [birth year] ? ?Transition of Care (TOC) CM/SW Contact:  ?Dahlia Client, Romeo Rabon, RN ?Phone Number: ?12/29/2021, 12:55 PM ? ? ?Clinical Narrative:    ?Pt for transition home today, HHPT and DME order for home 02 placed. CM in to speak with pt at bedside to discussed [facility] and home 02 needs.  ?Per pt he does not feel he is stable for discharge and wants to speak with his family. Further explained his Medicare IM and rights to him.  ?Discussed HH and list provided for choice Per CMS guidelines from medicare.gov website with star ratings (copy placed in shadow chart) - pt states he wants to speak with his son and daughter about the [facility] agencies- CM will f/u after lunch.  ?Spoke with pt about home 02- per pt he has a cpap and home 02 already in the home- pt thinks his home 02 is with [facility]now [facility]). He reports that he has a concentrator and one portable tank at home. Pt also reports that he does not use 02 at home all the time just PRN.  ? ?Call made to [facility] with [facility] to check on home 02 and referral for new liter flow.  ?1225- per RN note today- pt does not need home 02- [employer] updated.  ? ?18- received call from pt's son- Yanky and SIL- Glendell Docker- to discuss concerns about pt returning home. Questions answered about HH and recommendations by PT. Son to review Medicare list online. Family would also like to speak with MD about medical concerns- will have them reach out to bedside RN to try to coordinate getting in touch with attending. Also discussed home 02- and per son- pt does not have concentrator at home only tank and cpap-  will have [facility] look into current home 02 orders and set up to see if we can verify what pt is suppose to have at home. CM to f/u with [employer].  ? ?1555- have not heard  back from son- call made to follow up on [facility] choice- per Jeral Fruit. Patient's son they have not yet decided and he will have his sister [name] call to discuss. Son did state that MD has called and they were able to have question answered.  ? ?1630- received call back from Daughter Westly Pam- [phone]- for [facility] choice- they have decided on [facility] for [facility] needs- referral called to [facility] with [facility]- referral has been accepted.  ? ? ?Final next level of care: [place] ?Barriers to Discharge: No Barriers Identified ? ? ?Patient Goals and CMS Choice ?Patient states their goals for this hospitalization and ongoing recovery are:: return home w/ daughter ?CMS Medicare.gov Compare Post Acute Care list provided to:: Patient ?Choice offered to / list presented to : Patient, Adult Children ? ?Discharge Placement ?  ?           ? Home w/ HH ?  ?  ?  ? ?Discharge Plan and Services ?  ?Discharge Planning Services: CM Consult ?Post Acute Care Choice: Durable Medical Equipment, Home Health          ?DME Arranged: Oxygen ?DME Agency: [facility] ?Date DME Agency Contacted: 12/29/21 ?Time DME Agency Contacted: [phone] ?Representative spoke with at DME Agency: Thedore Mins ?HH Arranged: PT ?  ?Date  HH Agency Contacted: 12/29/21 ?  ?  ? ?Social Determinants of Health (SDOH) Interventions ?  ? ? ?Readmission Risk Interventions ?Readmission Risk Prevention Plan 12/29/2021  ?Transportation Screening Complete  ?PCP or Specialist Appt within 3-5 Days Complete  ?[place] or Home Care Consult Complete  ?Social Work Consult for [place] Planning/Counseling Complete  ?Palliative Care Screening Not Applicable  ?Medication Review Press photographer) Complete  ?Some recent data might be hidden  ? ? ? ? ? ?

## 2021-12-29 NOTE — Progress Notes (Signed)
Family at bedside for discharge instructions.  Information reviewed.  Questions addressed.  Understanding expressed by patient and family. ?

## 2021-12-29 NOTE — Progress Notes (Incomplete)
[facility] Renal Progress Note  Requesting MD:  Indication for Consultation: AKI on CKDIIIb  HPI:  Ireland Chagnon is a 81 yo M with PMH of CKD-IIIb, T2DM, HFrEF, CAD s/p multiple stents, HTN, HLD, PVD, BPH who presented to [facility] with CP, SOB, and diaphoresis was found to have NSTEMI w/ troponins 24K. His acute respiratory failure required BiPAP then NRB and aggressive diuresis. CXR + for pulmonary edema. He was subsequently transferred to [facility] for evaluation for LHC. However, his renal function has worsened after his ACS event and after receiving aggressive diuresis. Cardiology has deferred [place] in the setting of his improvement of ACS symptoms and AKI. 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 He stated he was out of his flomax for 2-3 weeks before his admission.     Creatinine continuing to improve. 3.18, down from 3.31. Issues maintaining sats over night. Currently sating 98 on 2L [place]. No rales on exam. May need gentle diuresis if condition worsens.  Has not urinated since last night. Nurse tried condom cath with no success. Finally urinated around 1100 this am. UOP approx 49ml. Bladder scan was 67ml.    Afebrile. BP 148/83. Cardiology has transitioned to clopidogrel + eliquis and Isodril and hydralazine with plans to increase hydral and iso dosing today if BP allows. Still holding ARB/ACE d/t renal function. Continues to be chest pain free.  Sodium 136 Potassium 4.3 Chloride 103 CO2 24 BUN 96 Creatinine 3.18 Glu 92 Calcium 8.8 Hgb 12.2   12/26/21 Renal US: Bilateral renal cysts similar to that seen on recent CT examination. No acute abnormality noted.  Mild increased echogenicity consistent with medical renal disease.  Creatinine, Ser  Date/Time Value Ref Range Status  12/29/2021 04:00 AM 3.18 (H) 0.61 - 1.24 mg/dL Final  12/28/2021 01:48 AM 3.31 (H) 0.61 - 1.24 mg/dL Final  12/27/2021 01:23 AM 3.79 (H) 0.61 - 1.24 mg/dL Final  12/26/2021 05:59 AM  3.55 (H) 0.61 - 1.24 mg/dL Final  12/25/2021 10:52 AM 3.54 (H) 0.61 - 1.24 mg/dL Final  12/25/2021 01:22 AM 3.37 (H) 0.61 - 1.24 mg/dL Final  12/24/2021 04:19 AM 2.52 (H) 0.61 - 1.24 mg/dL Final  09/15/2021 05:23 AM 2.15 (H) 0.61 - 1.24 mg/dL Final  09/14/2021 04:23 AM 2.35 (H) 0.61 - 1.24 mg/dL Final  09/13/2021 02:22 AM 1.98 (H) 0.61 - 1.24 mg/dL Final  05/05/2021 02:38 PM 2.30 (H) 0.76 - 1.27 mg/dL Final  12/20/2020 02:46 AM 1.76 (H) 0.61 - 1.24 mg/dL Final  12/19/2020 12:43 PM 1.52 (H) 0.61 - 1.24 mg/dL Final  06/25/2013 09:36 AM 1.60 (H) 0.50 - 1.35 mg/dL Final   PMHx:   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)    BPH (benign prostatic hyperplasia)    CAD (coronary artery disease)    Cholesteatoma of attic    Depression    Diabetes mellitus ([place])    Ear disease    GERD (gastroesophageal reflux disease)    Hyperlipidemia    Hypertension    PVD (peripheral vascular disease) ([place])    Stroke [facility])     Past Surgical History:  Procedure Laterality Date   BONE ANCHORED HEARING AID IMPLANT Left 06/25/2013   Procedure: BONE ANCHORED HEARING AID (BAHA) IMPLANT;  Surgeon: Fannie Knee, MD;  Location: [facility];  Service: ENT;  Laterality: Left;   CAROTID STENT INSERTION Left    COCHLEAR IMPLANT     CORONARY STENT PLACEMENT  MASS EXCISION Left 06/25/2013   Procedure: EXCISION LEFT TEMPORAL MASS;  Surgeon: Fannie Knee, MD;  Location: [facility];  Service: ENT;  Laterality: Left;   TONSILLECTOMY      Family Hx:  Family History  Problem Relation Age of Onset   Diabetes Other    Diabetes Mother    Diabetes Maternal Aunt    Depression Other    Depression Other    Drug abuse Daughter    Diabetes Daughter     Social History:  reports that he quit smoking about 32 years ago. His smoking use included cigarettes. He has never used smokeless tobacco. He reports that he does not currently use alcohol. He reports that he does  not use drugs.  Allergies:  Allergies  Allergen Reactions   Brilinta [Ticagrelor] Shortness Of Breath   Penicillins Hives   Ezetimibe-Simvastatin Other (See Comments)    Myalgia     Medications: Prior to Admission medications   Medication Sig Start Date End Date Taking? Authorizing Provider  albuterol (VENTOLIN HFA) 108 (90 Base) MCG/ACT inhaler Inhale 2 puffs into the lungs every 6 (six) hours as needed for wheezing or shortness of breath. 12/20/20  Yes Pokhrel, Laxman, MD  allopurinol (ZYLOPRIM) 300 MG tablet Take 300 mg by mouth daily. 06/21/12  Yes [provider]  aspirin 81 MG tablet Take 81 mg by mouth daily.   Yes [provider]  canagliflozin (INVOKANA) 100 MG TABS tablet Take 100 mg by mouth daily. 04/28/20  Yes [provider]  candesartan (ATACAND) 16 MG tablet Take 16 mg by mouth daily. 10/02/21  Yes [provider]  carvedilol (COREG) 12.5 MG tablet Take 1 tablet (12.5 mg total) by mouth 2 (two) times daily with a meal. 09/18/21  Yes Crenshaw, Denice Bors, MD  cholecalciferol (VITAMIN D3) 25 MCG (1000 UNIT) tablet Take 1,000 Units by mouth daily.   Yes [provider]  Cyanocobalamin (VITAMIN B 12 PO) Take 1 tablet by mouth daily.   Yes [provider]  furosemide (LASIX) 40 MG tablet Take 40 mg by mouth daily. 10/02/21  Yes [provider]  gabapentin (NEURONTIN) 300 MG capsule Take 300 mg by mouth 2 (two) times daily. 08/04/19  Yes [provider]  glimepiride (AMARYL) 4 MG tablet Take 4 mg by mouth daily with breakfast. 05/02/15  Yes [provider]  insulin glargine (LANTUS SOLOSTAR) 100 UNIT/ML Solostar Pen Inject 0-45 Units into the skin daily with supper. Dose per sliding scale. 10/01/19  Yes [provider]  pantoprazole (PROTONIX) 40 MG tablet Take 40 mg by mouth daily with breakfast. 08/19/14  Yes [provider]  rosuvastatin (CRESTOR) 20 MG tablet Take 1 tablet (20 mg total)  by mouth every evening. 12/14/21  Yes Crenshaw, Denice Bors, MD  senna (SENOKOT) 8.6 MG tablet Take 2-3 tablets by mouth daily as needed for constipation.   Yes [provider]  sertraline (ZOLOFT) 25 MG tablet Take 1 tablet (25 mg total) by mouth every evening. 01/09/21  Yes Thayer Headings, PMHNP  tamsulosin (FLOMAX) 0.4 MG CAPS capsule Take 0.4 mg by mouth daily. 09/22/14  Yes [provider]    I have reviewed the patient's current medications.  Labs:  Results for orders placed or performed during the hospital encounter of 12/24/21 (from the past 48 hour(s))  Glucose, capillary     Status: Abnormal   Collection Time: 12/27/21 11:04 AM  Result Value Ref Range   Glucose-Capillary 234 (H) 70 -  99 mg/dL    Comment: Glucose reference range applies only to samples taken after fasting for at least 8 hours.  Heparin level (unfractionated)     Status: Abnormal   Collection Time: 12/27/21 11:47 AM  Result Value Ref Range   Heparin Unfractionated 1.06 (H) 0.30 - 0.70 IU/mL    Comment: (NOTE) The clinical reportable range upper limit is being lowered to >1.10 to align with the FDA approved guidance for the current laboratory assay.  If heparin results are below expected values, and patient dosage has  been confirmed, suggest follow up testing of antithrombin III levels. Performed at [facility], [place] [address] [place], [place] [address]   Glucose, capillary     Status: Abnormal   Collection Time: 12/27/21  4:03 PM  Result Value Ref Range   Glucose-Capillary 254 (H) 70 - 99 mg/dL    Comment: Glucose reference range applies only to samples taken after fasting for at least 8 hours.  Glucose, capillary     Status: Abnormal   Collection Time: 12/27/21  9:25 PM  Result Value Ref Range   Glucose-Capillary 178 (H) 70 - 99 mg/dL    Comment: Glucose reference range applies only to samples taken after fasting for at least 8 hours.  Heparin level (unfractionated)     Status: None    Collection Time: 12/27/21 10:04 PM  Result Value Ref Range   Heparin Unfractionated 0.35 0.30 - 0.70 IU/mL    Comment: (NOTE) The clinical reportable range upper limit is being lowered to >1.10 to align with the FDA approved guidance for the current laboratory assay.  If heparin results are below expected values, and patient dosage has  been confirmed, suggest follow up testing of antithrombin III levels. Performed at [facility], [place] [address], [place], [place] [address]   CBC     Status: Abnormal   Collection Time: 12/28/21  1:48 AM  Result Value Ref Range   WBC 9.8 4.0 - 10.5 K/uL   RBC 3.86 (L) 4.22 - 5.81 MIL/uL   Hemoglobin 11.5 (L) 13.0 - 17.0 g/dL   HCT 35.1 (L) 39.0 - 52.0 %   MCV 90.9 80.0 - 100.0 fL   MCH 29.8 26.0 - 34.0 pg   MCHC 32.8 30.0 - 36.0 g/dL   RDW 15.7 (H) 11.5 - 15.5 %   Platelets 129 (L) 150 - 400 K/uL   nRBC 0.0 0.0 - 0.2 %    Comment: Performed at [facility], [place] [address], [place], [place] [address]  Heparin level (unfractionated)     Status: None   Collection Time: 12/28/21  1:48 AM  Result Value Ref Range   Heparin Unfractionated 0.39 0.30 - 0.70 IU/mL    Comment: (NOTE) The clinical reportable range upper limit is being lowered to >1.10 to align with the FDA approved guidance for the current laboratory assay.  If heparin results are below expected values, and patient dosage has  been confirmed, suggest follow up testing of antithrombin III levels. Performed at [facility], [place] [address], [place], [place] [address]   Basic metabolic panel     Status: Abnormal   Collection Time: 12/28/21  1:48 AM  Result Value Ref Range   Sodium 135 135 - 145 mmol/L   Potassium 4.5 3.5 - 5.1 mmol/L   Chloride 100 98 - 111 mmol/L   CO2 26 22 - 32 mmol/L   Glucose, Bld 160 (H) 70 - 99 mg/dL  Comment: Glucose reference range applies only to samples taken after fasting for at least 8 hours.   BUN 94 (H) 8 - 23 mg/dL    Creatinine, Ser 3.31 (H) 0.61 - 1.24 mg/dL   Calcium 8.7 (L) 8.9 - 10.3 mg/dL   GFR, Estimated 18 (L) >60 mL/min    Comment: (NOTE) Calculated using the CKD-EPI Creatinine Equation (2021)    Anion gap 9 5 - 15    Comment: Performed at [facility] [address] [place], [place] [address]  Glucose, capillary     Status: None   Collection Time: 12/28/21  6:08 AM  Result Value Ref Range   Glucose-Capillary 99 70 - 99 mg/dL    Comment: Glucose reference range applies only to samples taken after fasting for at least 8 hours.  Glucose, capillary     Status: None   Collection Time: 12/28/21  8:10 AM  Result Value Ref Range   Glucose-Capillary 92 70 - 99 mg/dL    Comment: Glucose reference range applies only to samples taken after fasting for at least 8 hours.   Comment 1 Notify RN    Comment 2 Document in Chart   Glucose, capillary     Status: Abnormal   Collection Time: 12/28/21 11:42 AM  Result Value Ref Range   Glucose-Capillary 139 (H) 70 - 99 mg/dL    Comment: Glucose reference range applies only to samples taken after fasting for at least 8 hours.   Comment 1 Notify RN    Comment 2 Document in Chart   Glucose, capillary     Status: Abnormal   Collection Time: 12/28/21  4:45 PM  Result Value Ref Range   Glucose-Capillary 179 (H) 70 - 99 mg/dL    Comment: Glucose reference range applies only to samples taken after fasting for at least 8 hours.   Comment 1 Notify RN    Comment 2 Document in Chart   Glucose, capillary     Status: Abnormal   Collection Time: 12/28/21  9:50 PM  Result Value Ref Range   Glucose-Capillary 189 (H) 70 - 99 mg/dL    Comment: Glucose reference range applies only to samples taken after fasting for at least 8 hours.   Comment 1 Notify RN    Comment 2 Document in Chart   CBC     Status: Abnormal   Collection Time: 12/29/21  4:00 AM  Result Value Ref Range   WBC 8.4 4.0 - 10.5 K/uL   RBC 4.10 (L) 4.22 - 5.81 MIL/uL   Hemoglobin 12.2 (L) 13.0 -  17.0 g/dL   HCT 37.0 (L) 39.0 - 52.0 %   MCV 90.2 80.0 - 100.0 fL   MCH 29.8 26.0 - 34.0 pg   MCHC 33.0 30.0 - 36.0 g/dL   RDW 15.9 (H) 11.5 - 15.5 %   Platelets 125 (L) 150 - 400 K/uL   nRBC 0.2 0.0 - 0.2 %    Comment: Performed at [facility] [address], [place], [place] [address]  Basic metabolic panel     Status: Abnormal   Collection Time: 12/29/21  4:00 AM  Result Value Ref Range   Sodium 136 135 - 145 mmol/L   Potassium 4.3 3.5 - 5.1 mmol/L   Chloride 103 98 - 111 mmol/L   CO2 24 22 - 32 mmol/L   Glucose, Bld 92 70 - 99 mg/dL    Comment: Glucose reference range applies only to samples taken after fasting  for at least 8 hours.   BUN 96 (H) 8 - 23 mg/dL   Creatinine, Ser 3.18 (H) 0.61 - 1.24 mg/dL   Calcium 8.8 (L) 8.9 - 10.3 mg/dL   GFR, Estimated 19 (L) >60 mL/min    Comment: (NOTE) Calculated using the CKD-EPI Creatinine Equation (2021)    Anion gap 9 5 - 15    Comment: Performed at [facility] [address] [place], [place] [address]  Glucose, capillary     Status: None   Collection Time: 12/29/21  6:09 AM  Result Value Ref Range   Glucose-Capillary 73 70 - 99 mg/dL    Comment: Glucose reference range applies only to samples taken after fasting for at least 8 hours.   Comment 1 Notify RN    Comment 2 Document in Chart      ROS:  As per HPI. A comprehensive review of systems was negative.   Physical Exam: Vitals:   12/29/21 0351 12/29/21 0713  BP: (!) 158/89 (!) 148/83  Pulse: 100   Resp: 15 14  Temp: 97.6 F (36.4 C) 97.6 F (36.4 C)  SpO2: 94% 93%     General: General exam: Pleasant elderly white male sitting in chair, AAOx3 HEENT: No JVD CVS: S1-S2, irregular rhythm Lungs: CTABL Abdomen: Soft, nontender, bowel sounds present Extremities: no edema  Skin: No rashes Psychiatry: Judgement and insight appear normal. Mood & affect appropriate.   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 Will need to  follow up with [place] in clinic after discharge - Likely multifactorial from aggressive diuresis, bladder obstruction 2/2 BPH. May have a cardiorenal component as well; recommend avoiding contrast exposure. Delay non-urgent cardiac cath for now.   -No urgent need for dialysis  -Baseline Cr around 2; Cr currently 3.18 -Electrolytes stable -Avoids NSAIDs, nephrotoxins -Monitor UOP, BMP --> may need to place foley back in if urinary retention continues to be a problem; avoid anticholinergics    2. Hypertension/volume  -  -Appears slightly hypervolemic on exam on exam given dyspnea. Continue to hold diuretics for now to allow more time for kidney function to improve. However, if hypervolemia and respiratory status worsens, okay to proceed with 80mg  IV lasix.  -Continue HTN management with Coreg, Hydralazine    4. Anemia  - Mild normocytic anemia.  Could consider iron panel for further workup.   Abelardo Diesel Aadit Hagood 12/29/2021, 8:52 AM

## 2021-12-29 NOTE — Progress Notes (Signed)
Physical Therapy Treatment ?Patient Details ?Name: John [name] ?MRN: [id number] ?DOB: [birth year] ?Today's Date: 12/29/2021 ? ? ?History of Present Illness Patient is an 81 yo male presenting to [facility] on 3/5 with NSTEMI. Significantly elevated troponin levels in ED and new onset of A-fib. 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 Does use oxygen at home PRN. ? ?  ?PT Comments  ? ? Pt received in supine, agreeable to therapy session with emphasis on bed mobility, transfers and gait progression. Gait distance limited due to pt need for toileting, pt up in bathroom and able to demo back use of call bell, RN aware. Plan to continue session again once pt complete -he had a suppository ~1hr prior. SpO2 WFL on RA with mobility. Pt continues to benefit from PT services to progress toward functional mobility goals.   ?Recommendations for follow up therapy are one component of a multi-disciplinary discharge planning process, led by the attending physician.  Recommendations may be updated based on patient status, additional functional criteria and insurance authorization. ? ?Follow Up Recommendations ? Home health PT ?  ?  ?Assistance Recommended at Discharge PRN  ?Patient can return home with the following A little help with walking and/or transfers;A little help with bathing/dressing/bathroom;Assist for transportation;Assistance with cooking/housework ?  ?Equipment Recommendations ? None recommended by PT  ?  ?Recommendations for Other Services   ? ? ?  ?Precautions / Restrictions Precautions ?Precautions: Fall;Other (comment) ?Precaution Comments: monitor O2 (does not wear at baseline) ?Restrictions ?Weight Bearing Restrictions: No  ?  ? ?Mobility ? Bed Mobility ?Overal bed mobility: Needs Assistance ?Bed Mobility: Rolling, Sidelying to Sit ?Rolling: Modified independent (Device/Increase  time) ?Sidelying to sit: Supervision ?  ?  ?  ?General bed mobility comments: Increased time and management of lines, to R EOB; pt able to perform from flat [facility] without rail and cues for propping on R elbow to raise trunk and log roll technique ?  ? ?Transfers ?Overall transfer level: Needs assistance ?Equipment used: Rolling walker (2 wheels) ?Transfers: Sit to/from Stand ?Sit to Stand: Supervision ?  ?  ?  ?  ?  ?General transfer comment: needs reminder for use of brakes occasionally, otherwise modI ?  ? ?Ambulation/Gait ?Ambulation/Gait assistance: Supervision ?Gait Distance (Feet): 20 Feet ?Assistive device: Rollator (4 wheels) ?Gait Pattern/deviations: Trunk flexed ?  ?  ?  ?General Gait Details: good  use of rollator, cues for upright posture and line awareness and to use brakes prior to sitting ? ? ?Stairs ?  ?  ?  ?  ?  ? ? ?Wheelchair Mobility ?  ? ?Modified Rankin (Stroke Patients Only) ?  ? ? ?  ?Balance Overall balance assessment: Needs assistance ?Sitting-balance support: Feet supported, No upper extremity supported ?Sitting balance-Leahy Scale: Good ?  ?  ?Standing balance support: Bilateral upper extremity supported, No upper extremity supported, During functional activity ?Standing balance-Leahy Scale: Fair ?  ?  ?  ?  ?  ?  ?  ?  ?  ?  ?  ?  ?  ? ?  ?Cognition Arousal/Alertness: Awake/alert ?Behavior During Therapy: [facility] for tasks assessed/performed ?Overall Cognitive Status: Within Functional Limits for tasks assessed ?  ?  ?  ?  ?  ?  ?  ?  ?  ?  ?  ?  ?  ?  ?  ?  ?  General Comments: anxious regarding O2 needs, SpO2 WFL and pt reassured of this, discussion on activity pacing. ?  ?  ? ?  ?Exercises   ? ?  ?General Comments General comments (skin integrity, edema, etc.): pt urgent to use toilet, distance limited plan to continue after toileting ?  ?  ? ?Pertinent Vitals/Pain Pain Assessment ?Pain Assessment: No/denies pain ?Breathing:  (reports occasional neuropathy at baseline)  ? ? ? ?PT Goals  (current goals can now be found in the care plan section) Acute Rehab PT Goals ?Patient Stated Goal: to feel ready and go home ?PT Goal Formulation: With patient ?Time For Goal Achievement: 01/09/22 ?Progress towards PT goals: Progressing toward goals ? ?  ?Frequency ? ? ? Min 3X/week ? ? ? ?  ?PT Plan Current plan remains appropriate  ? ? ?   ?AM-PAC PT "6 Clicks" Mobility   ?Outcome Measure ? Help needed turning from your back to your side while in a flat bed without using bedrails?: None ?Help needed moving from lying on your back to sitting on the side of a flat bed without using bedrails?: A Little ?Help needed moving to and from a bed to a chair (including a wheelchair)?: A Little ?Help needed standing up from a chair using your arms (e.g., wheelchair or bedside chair)?: A Little ?Help needed to walk in hospital room?: A Little ?Help needed climbing 3-5 steps with a railing? : A Little ?6 Click Score: 19 ? ?  ?End of Session Equipment Utilized During Treatment: Gait belt ?Activity Tolerance: Patient tolerated treatment well ?Patient left: in chair;with call bell/phone within reach;Other (comment) (on toilet, pt aware of pull bell on wall, pt needing increased time to attempt BM) ?Nurse Communication: Mobility status ?PT Visit Diagnosis: Other abnormalities of gait and mobility (R26.89);Muscle weakness (generalized) (M62.81) ?  ? ? ?Time: 2620-3559 ?PT Time Calculation (min) (ACUTE ONLY): 17 min ? ?Charges:  $Therapeutic Activity: 8-22 mins          ?          ? ?Marvis Saefong P., PTA ?Acute Rehabilitation Services ?Pager: [phone] ?Office: [phone]  ? ? ?Kara Pacer Nanda Bittick ?12/29/2021, 12:23 PM ? ?

## 2021-12-29 NOTE — Progress Notes (Signed)
Discharge orders received earlier this afternoon.  Pt's son called concerned that pt is scared to go home as he continues to experience dyspnea especially with exertion.  Dr. Avon Gully notified and asked to call family at son's request.   ? ?As per secure chat with Dr. Avon Gully, he has spoken to pt's family and they understand that dyspnea is consistent with pt's EF and, all other assessment findings considered, pt is stable for discharge to home with home health.  Case mgt is awaiting son's call back with selected [facility] agency.  ? ?Pt has contacted family for transport to home. ?

## 2021-12-29 NOTE — Progress Notes (Signed)
SATURATION QUALIFICATIONS: (This note is used to comply with regulatory documentation for home oxygen) ? ?Patient Saturations on Room Air at Rest = 94% ? ?Patient Saturations on Room Air while Ambulating = 95% ? ?Patient Saturations on 0 Liters of oxygen while Ambulating = 95% ? ?Please briefly explain why patient needs home oxygen: ?

## 2021-12-29 NOTE — Progress Notes (Addendum)
SATURATION QUALIFICATIONS: (This note is used to comply with regulatory documentation for home oxygen) ? ?Patient Saturations on Room Air at Rest = 94% ? ?Patient Saturations on Room Air while Ambulating = 88% ? ?Patient Saturations on 2 Liters of oxygen while Ambulating = 94% ? ?Please briefly explain why patient needs home oxygen: Patient SOB, dyspneic as he was getting dressed, reassessed walking O2 sats and found SpO2<90% ?

## 2021-12-29 NOTE — Progress Notes (Addendum)
Progress Note  Patient Name: John [name] Date of Encounter: 12/29/2021  Primary Cardiologist: Kirk Ruths MD  Subjective   Patient continues to deny any chest pain. He did have some shortness of breath with ambulation.  Inpatient Medications    Scheduled Meds:  allopurinol  100 mg Oral Daily   apixaban  2.5 mg Oral BID   carvedilol  12.5 mg Oral BID WC   Chlorhexidine Gluconate Cloth  6 each Topical Daily   cholecalciferol  1,000 Units Oral Daily   clopidogrel  75 mg Oral Daily   folic acid  1 mg Oral Daily   gabapentin  300 mg Oral QHS   hydrALAZINE  37.5 mg Oral BID   insulin aspart  0-20 Units Subcutaneous TID WC   insulin glargine-yfgn  50 Units Subcutaneous Daily   isosorbide dinitrate  20 mg Oral BID   multivitamin with minerals  1 tablet Oral Daily   pantoprazole  40 mg Oral Daily   predniSONE  50 mg Oral Q breakfast   rosuvastatin  10 mg Oral QPM   sertraline  25 mg Oral QPM   sodium chloride flush  3 mL Intravenous Q12H   sodium chloride flush  3 mL Intravenous Q12H   tamsulosin  0.4 mg Oral QPM   thiamine  100 mg Oral Daily   Or   thiamine  100 mg Intravenous Daily   Continuous Infusions:  sodium chloride Stopped (12/24/21 1700)   levofloxacin (LEVAQUIN) IV Stopped (12/28/21 0951)   PRN Meds: sodium chloride, acetaminophen **OR** acetaminophen, bisacodyl, hydrALAZINE, HYDROmorphone (DILAUDID) injection, ipratropium, levalbuterol, ondansetron **OR** ondansetron (ZOFRAN) IV, oxyCODONE, senna-docusate, traZODone   Vital Signs    Vitals:   12/29/21 0351 12/29/21 0355 12/29/21 0713 12/29/21 1232  BP: (!) 158/89  (!) 148/83 (!) 146/57  Pulse: 100   75  Resp: 15  14 18   Temp: 97.6 F (36.4 C)  97.6 F (36.4 C) 98 F (36.7 C)  TempSrc: Oral  Oral Oral  SpO2: 94%  93% 94%  Weight:  89.3 kg    Height:        Intake/Output Summary (Last 24 hours) at 12/29/2021 1319 Last data filed at 12/29/2021 1100 Gross per 24 hour  Intake 340 ml  Output 2000  ml  Net -1660 ml     I/O since admission:   Filed Weights   12/27/21 0432 12/28/21 0438 12/29/21 0355  Weight: 90.1 kg 90.5 kg 89.3 kg    Telemetry    Few PVCs, short run of bigeminy, otherwise NSR- Personally Reviewed  ECG    ECG (independently read by me): No new EKG  Physical Exam   BP (!) 146/57 (BP Location: Left Arm)    Pulse 75    Temp 98 F (36.7 C) (Oral)    Resp 18    Ht 5\' 6"  (1.676 m)    Wt 89.3 kg    SpO2 94%    BMI 31.78 kg/m  Constitutional: Well-developed, well-nourished, sitting in his chair in no distress.  HENT:  Head: Normocephalic and atraumatic.  Neck: Normal range of motion.  Cardiovascular: Normal rate, regular rhythm, intact distal pulses. No gallop and no friction rub.  No murmur heard. No lower extremity edema  Pulmonary: Non labored breathing on 2L , mild bibasilar rales, no wheezing  Abdominal: Soft. Normal bowel sounds. Non distended and non tender Musculoskeletal: Normal range of motion.        General: No tenderness or edema.  Neurological: Alert and oriented  to person, place, and time. Non focal  Skin: Skin is warm and dry.    Labs    Chemistry Recent Labs  Lab 12/24/21 0419 12/25/21 0122 12/26/21 0559 12/27/21 0123 12/28/21 0148 12/29/21 0400  NA 137   < > 135 133* 135 136  K 4.2   < > 4.9 4.7 4.5 4.3  CL 101   < > 97* 98 100 103  CO2 26   < > 23 25 26 24   GLUCOSE 263*   < > 234* 192* 160* 92  BUN 38*   < > 77* 91* 94* 96*  CREATININE 2.52*   < > 3.55* 3.79* 3.31* 3.18*  CALCIUM 9.0   < > 9.0 8.7* 8.7* 8.8*  PROT 8.4*  --   --   --   --   --   ALBUMIN 4.2  --  3.3*  --   --   --   AST 32  --   --   --   --   --   ALT 25  --   --   --   --   --   ALKPHOS 74  --   --   --   --   --   BILITOT 0.9  --   --   --   --   --   GFRNONAA 25*   < > 16* 15* 18* 19*  ANIONGAP 10   < > 15 10 9 9    < > = values in this interval not displayed.      Hematology Recent Labs  Lab 12/27/21 0123 12/28/21 0148 12/29/21 0400  WBC  11.2* 9.8 8.4  RBC 3.76* 3.86* 4.10*  HGB 10.7* 11.5* 12.2*  HCT 34.3* 35.1* 37.0*  MCV 91.2 90.9 90.2  MCH 28.5 29.8 29.8  MCHC 31.2 32.8 33.0  RDW 15.5 15.7* 15.9*  PLT 126* 129* 125*     Cardiac Enzymes Component Ref Range & Units 1 d ago (12/24/21) 1 d ago (12/24/21) 1 d ago (12/24/21) 3 mo ago (09/13/21) 3 mo ago (09/13/21) 3 mo ago (09/13/21) 3 mo ago (09/13/21)  Troponin I (High Sensitivity) <18 ng/L >24,000 High Panic   313 High Panic  CM  39 High  CM  484 High Panic  CM  489 High Panic  CM  82 High  CM  34 High  CM    BNP Recent Labs  Lab 12/25/21 0122 12/26/21 0559 12/27/21 0123  BNP 1,009.5* 885.5* 649.9*      DDimer  Recent Labs  Lab 12/24/21 0419  DDIMER 2.19*      Lipid Panel     Component Value Date/Time   CHOL 99 12/24/2021 2008   TRIG 95 12/24/2021 2008   HDL 51 12/24/2021 2008   CHOLHDL 1.9 12/24/2021 2008   VLDL 19 12/24/2021 2008   [place] 29 12/24/2021 2008      Radiology    No results found.  Cardiac Studies   TTE 09/14/21: EF estimated 40 to 45% with mildly decreased function.  Moderate LVH.  GRII DD-with mildly dilated left atrium.  Normal RV size and function.  Unable to assess RVP.  Normal RAP. Normal aortic and mitral valve.    TTE 12/25/2021: EF~35% with moderately reduced function.  Global HK.  Mild LV dilation.  Mild LVH.  GRII DD.  Normal RV function, but unable to assess RVP.  Mild aortic valve calcification but no stenosis.  Patient Profile     81  y.o. 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 He was subsequently transferred to [facility] for evaluation for LHC.  => Unfortunately,  worsening renal function this hospitalization has precluded invasive ischemic evaluation.   Assessment & Plan    #Type I NSTEMI  Patient presented with acute onset chest pain, SOB, and diaphoresis and found to have markedly elevated troponins >24,000.  CT chest performed in the ED also revealed significant multivessel CAD involving the left main coronary as well. Patient was transferred to [facility] for evaluation for LHC. Echo 3/6 shows worsening EF compared with 08/2021 Echo. Given patient's AKI (apparent BL ~2)  in the setting of diuresis will continue to hold on catheterization. In addition, patient remains asymptomatic.  Heparin infusion d/c'd this AM and patient transitioned to [place] for afib below.  Also converted from aspirin 81 mg to Plavix 75 mg -Asx on isordil 10mg  BID/hydralazine 25 mg twice daily-->-Will increase oral hydralazine to 37.5mg  twice daily and Isordil 20 mg twice daily for better blood pressure control.  (Isordil/hydralazine used since patient cannot be on ACE/ARB due to acute on chronic kidney injury) -Continue Coreg 12.5mg  BID  -Continue to hold ACE/ARB given AKI -using hydralazine/nitrate -Continue crestor 20mg  qd, LDL 29 -Hold on catheterization at this time given renal function, will likely need evaluation in the outpatient setting once renal function normalizes. Follows closely with Dr. [name] has appointment 4/4 will likely need an appointment in 2 weeks.  => Can consider outpatient ischemic evaluation in 4 to 6 weeks if symptoms warrant.  Good option given the fact that he does have LAD stents, and recent infarction would be cardiac stress MRI (with Dr. Gardiner Rhyme)  #Acute on Chronic HFrEF Most recent EF 35%. He continues to be euvolemic on exam. O2 requirement increased this AM. No rales on exam.  -Daily weights  -Strict I's and O's  Renal function seems to have plateaued.  & is trending down. Need to monitor for post ATN diuresis  -ACE/ARB, aldactone held due to  renal function Will likely need Low dose Diuretic to be started - potentially when seen in TCM f/u. (If not started on d/c by [facility] IM MD)  #New Onset Atrial Fibrillation Noted to go in and out of atrial fibrillation while in the ED with his atrial fibrillation captured on EKG. Telemetry mostly being in NSR. pAFib is likely secondary to his acute MI and hypoxia/hypercapnia. CHA2DS2-VASc score is 6. His heparin gtt for NSTEMI was stopped this AM and DOAC started.  -Continue telemetry -DOAC started = Eliquis 10 mg twice daily -Rate control with Coreg => if there is significant recurrence, would have low threshold to consider chemical cardioversion with amiodarone, to avoid exacerbation of CHF. -TTE as above indicating reduced EF from baseline.  #PVD  s/p R fem-bk-pop in 2019 - with NSTEMI presentation - convert ASA to Plavix  - Continue crestor   #AKI on CKD stage 3 BL sCr ~2. Continue to improve.. Thought to be due to cardiorenal syndrome initially given rales and c/f pulmonary edema on exam. Patient was however diuresed with worsening of his sCr. Potentially ATN  as well.   Appears to be euvolemic and is now on room air. Renal U/S c/w some degree of medicorenal disease. Urinalysis 3/7 with HC and granular casts suggesting patient may be intravascularly down.  -Will continue to hold lasix, will need close follow up with Dr. [name] to decide when to resume this. -Renal function improving though BUN increasing, could be related to patient being intravascularly down, he is also on prednisone. Would hold off on IV fluids and continue to see if patient's kidney function improves with oral rehydration only --Monitor for signs of post ATN diuresis. (With Foley catheter out, also need to monitor for degree obstruction) Reduce dose of rosuvastatin until renal Fxn improves (decrease to 10 mg)  #Acute Hypoxic and Hypercapneic Respiratory Failure / #Acute Pulmonary Edema -> resolved #Possible CAP  Likely  2/2 pulmonary edema. Briefly tolerated BiPAP now breathing without distress on RA. -Aggressive pulm toilet  -s/p levaquin for possible PNA -Will need to resume oral lasix in the outpatient setting, will have appointment with Dr. Jacalyn Lefevre office within the next week.   #BPH -continue home flomax, has foley catheter in place. Per primary.   Rick Duff, MD PGY-2 Internal Medicine  Pager [phone] 12/29/2021, 1:19 PM    ATTENDING ATTESTATION  I have seen, examined and evaluated the patient this AM along with Dr.Carter (R2).  After reviewing all the available data and chart, we discussed the patients laboratory, study & physical findings as well as symptoms in detail. I agree with his findings, examination as well as impression recommendations as per our discussion.    Attending adjustments noted in italics.   Feeling little bit better today.  But Bhagat little bit short of breath after walking.  Put his oxygen back on again. My understanding is he probably is pending discharge today, if so he may want to consider low-dose diuretic either now or when seen in TCM.  We have arranged for him to be called about TCM follow-up.  Agree with plan as above.   Glenetta Hew, M.D., M.S. Interventional Cardiologist   Pager # [phone] Phone # [phone] [place], [place] [address] '

## 2021-12-29 NOTE — Progress Notes (Addendum)
Physical Therapy Treatment ?Patient Details ?Name: John [name] ?MRN: [id number] ?DOB: 1941/07/18 ?Today's Date: 12/29/2021 ? ? ?History of Present Illness Patient is an 81 yo male presenting to [facility] on 3/5 with NSTEMI. Significantly elevated troponin levels in ED and new onset of A-fib. 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 Does use oxygen at home PRN. ? ?  ?PT Comments  ? ? Pt received up on toilet, agreeable to therapy session and pt reports he was unable to have BM after suppository. Pt able to progress to household distance gait trials x3 with seated breaks when fatigued on rollator. Pt needs Supervision at most for assistance and also able to perform 3 steps per home setup with L rail to ascend and min guard for safety. Pt returned to toilet at end of session, RN notified, pt aware of call pull bell. SpO2 94-100% on RA with exertion and 94-97% resting in seated posture. Pt continues to benefit from PT services to progress toward functional mobility goals. Continue to recommend HHPT.   ?Recommendations for follow up therapy are one component of a multi-disciplinary discharge planning process, led by the attending physician.  Recommendations may be updated based on patient status, additional functional criteria and insurance authorization. ? ?Follow Up Recommendations ? Home health PT ?  ?  ?Assistance Recommended at Discharge PRN  ?Patient can return home with the following A little help with walking and/or transfers;A little help with bathing/dressing/bathroom;Assist for transportation;Assistance with cooking/housework ?  ?Equipment Recommendations ? None recommended by PT  ?  ?Recommendations for Other Services   ? ? ?  ?Precautions / Restrictions Precautions ?Precautions: Fall;Other (comment) ?Precaution Comments: monitor O2 (does not wear at  baseline) ?Restrictions ?Weight Bearing Restrictions: No  ?  ? ?Mobility ? Bed Mobility ?Overal bed mobility: Needs Assistance ?Bed Mobility: Rolling, Sidelying to Sit ?Rolling: Modified independent (Device/Increase time) ?Sidelying to sit: Supervision ?  ?  ?  ?General bed mobility comments: received up on toilet ?  ? ?Transfers ?Overall transfer level: Needs assistance ?Equipment used: Rolling walker (2 wheels) ?Transfers: Sit to/from Stand ?Sit to Stand: Modified independent (Device/Increase time) ?  ?  ?  ?  ?  ?General transfer comment: pt recalls use of brakes for second session ?  ? ?Ambulation/Gait ?Ambulation/Gait assistance: Supervision ?Gait Distance (Feet): 150 Feet (x3 trials, seated breaks on rollator between) ?Assistive device: Rollator (4 wheels) ?Gait Pattern/deviations: Trunk flexed, Decreased stride length ?Gait velocity: decreased ?  ?  ?General Gait Details: good use of rollator, cues for upright posture and line awareness; SpO2 WFL on RA ? ? ?Stairs ?Stairs: Yes ?Stairs assistance: Min guard ?Stair Management: One rail Left, Forwards, Step to pattern ?Number of Stairs: 3 ?General stair comments: cues for step sequencing, pt needs repetition of cues each step; no overt LOB or buckling, SpO2 WFL on RA ? ? ?Wheelchair Mobility ?  ? ?Modified Rankin (Stroke Patients Only) ?  ? ? ?  ?Balance Overall balance assessment: Needs assistance ?Sitting-balance support: Feet supported, No upper extremity supported ?Sitting balance-Leahy Scale: Good ?  ?  ?Standing balance support: Bilateral upper extremity supported, No upper extremity supported, During functional activity ?Standing balance-Leahy Scale: Fair ?Standing balance comment: Needs at least U UE support for dynamic standing tasks but prefers BUE support of rollator for longer gait trials ?  ?  ?  ?  ?  ? ?  ?  Cognition Arousal/Alertness: Awake/alert ?Behavior During Therapy: [facility] for tasks assessed/performed ?Overall Cognitive Status: Within  Functional Limits for tasks assessed ?  ?  ?  ?General Comments: anxious regarding O2 needs, SpO2 WFL and pt reassured of this, discussion on activity pacing. ?  ?  ? ?  ?Exercises   ? ?  ?General Comments General comments (skin integrity, edema, etc.): HR 70-80's bpm with exertion, SpO2 WFL on RA >92% ?  ?  ? ?Pertinent Vitals/Pain Pain Assessment ?Pain Assessment: No/denies pain ?Breathing:  (reports occasional neuropathy at baseline)  ? ? ? ?PT Goals (current goals can now be found in the care plan section) Acute Rehab PT Goals ?Patient Stated Goal: to feel ready and go home ?PT Goal Formulation: With patient ?Time For Goal Achievement: 01/09/22 ?Progress towards PT goals: Progressing toward goals ? ?  ?Frequency ? ? ? Min 3X/week ? ? ? ?  ?PT Plan Current plan remains appropriate  ? ? ?   ?AM-PAC PT "6 Clicks" Mobility   ?Outcome Measure ? Help needed turning from your back to your side while in a flat bed without using bedrails?: None ?Help needed moving from lying on your back to sitting on the side of a flat bed without using bedrails?: A Little ?Help needed moving to and from a bed to a chair (including a wheelchair)?: A Little ?Help needed standing up from a chair using your arms (e.g., wheelchair or bedside chair)?: A Little ?Help needed to walk in hospital room?: A Little ?Help needed climbing 3-5 steps with a railing? : A Little ?6 Click Score: 19 ? ?  ?End of Session Equipment Utilized During Treatment: Gait belt ?Activity Tolerance: Patient tolerated treatment well ?Patient left: in chair;with call bell/phone within reach;Other (comment) (on toilet again, pt aware of pull bell on wall, pt needing increased time to attempt BM) ?Nurse Communication: Mobility status ?PT Visit Diagnosis: Other abnormalities of gait and mobility (R26.89);Muscle weakness (generalized) (M62.81) ?  ? ? ?Time: 1152-1208 ?PT Time Calculation (min) (ACUTE ONLY): 16 min ? ?Charges:  $Gait Training: 8-22 mins ?         ?           ? ?Gwendoline Judy P., PTA ?Acute Rehabilitation Services ?Pager: [phone] ?Office: [phone]  ? ? ?Kara Pacer Yamna Mackel ?12/29/2021, 12:35 PM ? ?

## 2022-01-03 ENCOUNTER — Telehealth: Payer: Self-pay | Admitting: Cardiology

## 2022-01-03 NOTE — Telephone Encounter (Signed)
Spoke with pt, he would like a portable concentrator so he is able to travel. Spoke with [employer] and they report he would need a walk test for him to be able to get a concentrator. Patient reports when he walks around his home his oxygen level will drop to 90%. Aware it would need to fall below 88% before he would qualify. The patient has decided he will wait until after his follow up appointment before getting a concentrator. ?

## 2022-01-03 NOTE — Telephone Encounter (Signed)
Pt was seen in hosp by DR Ellyn Hack, He stated he was giving oxgyen and would like to know if a portable tank would work for him because he would like to travel.  If so he would like to know if a script could be called in for it?  He is a pt of Dr [name]  ? ?Best number [phone] ?

## 2022-01-05 ENCOUNTER — Telehealth (HOSPITAL_BASED_OUTPATIENT_CLINIC_OR_DEPARTMENT_OTHER): Payer: Self-pay | Admitting: Pharmacist

## 2022-01-05 ENCOUNTER — Other Ambulatory Visit (HOSPITAL_COMMUNITY): Payer: Self-pay

## 2022-01-05 NOTE — Telephone Encounter (Signed)
Called patient daughter, she states she had missed Debra's call from yesterday in regards to the FMLA forms. I advised that she was off today and to see if I could assist, but she states that she just wanted to answer her questions about these forms. I advised it would be sometime next week before she could call back- patient verbalized understanding, thankful for return call.  ? ?

## 2022-01-05 NOTE — Telephone Encounter (Signed)
Pharmacy Transitions of Care Follow-up Telephone Call ? ?Date of discharge: 12/29/2021   ?Discharge Diagnosis: NSTEMI ? ?How have you been since you were released from the hospital? Doing well, getting energy back, breathing has been better. Patient only had refills at [place] on Lantus and clopidogrel. He only wanted the insulin transferred to home pharmacy at this time. He had a visit with PCP today, who discussed potentially setting him up for medication blister packaging. No concerns of questions today.  ?  ? ?Medication Accessibility: ? ?Home Pharmacy: [facility] on [address]  ? ?Was the patient provided with refills on discharged medications? Yes  ? ?Have all prescriptions been transferred from [facility] to home pharmacy? Only on Lantus script (sent to [facility] - Mail order) ? ?Remer Macho, PharmD ?Clinical Pharmacist ?Engineer, production at [employer] ?Pharmacy: [phone]  ?01/05/2022 2:06 PM ? ?

## 2022-01-05 NOTE — Telephone Encounter (Signed)
Daughter of patient called to speak to Hilda Blades regarding the patient's FMLA Forms  ?

## 2022-01-08 ENCOUNTER — Emergency Department (HOSPITAL_COMMUNITY): Payer: Medicare Other

## 2022-01-08 ENCOUNTER — Encounter (HOSPITAL_COMMUNITY): Payer: Self-pay | Admitting: Emergency Medicine

## 2022-01-08 ENCOUNTER — Other Ambulatory Visit (HOSPITAL_COMMUNITY): Payer: Self-pay

## 2022-01-08 ENCOUNTER — Inpatient Hospital Stay (HOSPITAL_COMMUNITY)
Admission: EM | Admit: 2022-01-08 | Discharge: 2022-01-10 | DRG: 280 | Disposition: A | Discharge status: Home Health | Payer: Medicare Other | Attending: Internal Medicine | Admitting: Internal Medicine

## 2022-01-08 ENCOUNTER — Other Ambulatory Visit: Payer: Self-pay

## 2022-01-08 DIAGNOSIS — Z955 Presence of coronary angioplasty implant and graft: Secondary | ICD-10-CM

## 2022-01-08 DIAGNOSIS — Z833 Family history of diabetes mellitus: Secondary | ICD-10-CM

## 2022-01-08 DIAGNOSIS — I130 Hypertensive heart and chronic kidney disease with heart failure and stage 1 through stage 4 chronic kidney disease, or unspecified chronic kidney disease: Secondary | ICD-10-CM | POA: Diagnosis not present

## 2022-01-08 DIAGNOSIS — I214 Non-ST elevation (NSTEMI) myocardial infarction: Secondary | ICD-10-CM | POA: Diagnosis present

## 2022-01-08 DIAGNOSIS — N400 Enlarged prostate without lower urinary tract symptoms: Secondary | ICD-10-CM | POA: Diagnosis present

## 2022-01-08 DIAGNOSIS — Z87891 Personal history of nicotine dependence: Secondary | ICD-10-CM

## 2022-01-08 DIAGNOSIS — I255 Ischemic cardiomyopathy: Secondary | ICD-10-CM | POA: Diagnosis present

## 2022-01-08 DIAGNOSIS — E1122 Type 2 diabetes mellitus with diabetic chronic kidney disease: Secondary | ICD-10-CM | POA: Diagnosis present

## 2022-01-08 DIAGNOSIS — Z7901 Long term (current) use of anticoagulants: Secondary | ICD-10-CM

## 2022-01-08 DIAGNOSIS — Z880 Allergy status to penicillin: Secondary | ICD-10-CM

## 2022-01-08 DIAGNOSIS — Z818 Family history of other mental and behavioral disorders: Secondary | ICD-10-CM

## 2022-01-08 DIAGNOSIS — I480 Paroxysmal atrial fibrillation: Secondary | ICD-10-CM | POA: Diagnosis present

## 2022-01-08 DIAGNOSIS — G4733 Obstructive sleep apnea (adult) (pediatric): Secondary | ICD-10-CM | POA: Diagnosis present

## 2022-01-08 DIAGNOSIS — N1832 Chronic kidney disease, stage 3b: Secondary | ICD-10-CM | POA: Diagnosis present

## 2022-01-08 DIAGNOSIS — E1121 Type 2 diabetes mellitus with diabetic nephropathy: Secondary | ICD-10-CM

## 2022-01-08 DIAGNOSIS — F419 Anxiety disorder, unspecified: Secondary | ICD-10-CM | POA: Diagnosis present

## 2022-01-08 DIAGNOSIS — I5043 Acute on chronic combined systolic (congestive) and diastolic (congestive) heart failure: Secondary | ICD-10-CM | POA: Insufficient documentation

## 2022-01-08 DIAGNOSIS — N179 Acute kidney failure, unspecified: Secondary | ICD-10-CM | POA: Diagnosis not present

## 2022-01-08 DIAGNOSIS — K219 Gastro-esophageal reflux disease without esophagitis: Secondary | ICD-10-CM | POA: Diagnosis present

## 2022-01-08 DIAGNOSIS — T380X5A Adverse effect of glucocorticoids and synthetic analogues, initial encounter: Secondary | ICD-10-CM | POA: Diagnosis present

## 2022-01-08 DIAGNOSIS — Z20822 Contact with and (suspected) exposure to covid-19: Secondary | ICD-10-CM | POA: Diagnosis present

## 2022-01-08 DIAGNOSIS — D72829 Elevated white blood cell count, unspecified: Secondary | ICD-10-CM | POA: Diagnosis present

## 2022-01-08 DIAGNOSIS — I700 Atherosclerosis of aorta: Secondary | ICD-10-CM | POA: Diagnosis present

## 2022-01-08 DIAGNOSIS — J9601 Acute respiratory failure with hypoxia: Secondary | ICD-10-CM

## 2022-01-08 DIAGNOSIS — I252 Old myocardial infarction: Secondary | ICD-10-CM

## 2022-01-08 DIAGNOSIS — J9621 Acute and chronic respiratory failure with hypoxia: Secondary | ICD-10-CM | POA: Diagnosis present

## 2022-01-08 DIAGNOSIS — E119 Type 2 diabetes mellitus without complications: Secondary | ICD-10-CM

## 2022-01-08 DIAGNOSIS — E11649 Type 2 diabetes mellitus with hypoglycemia without coma: Secondary | ICD-10-CM | POA: Diagnosis not present

## 2022-01-08 DIAGNOSIS — F32A Depression, unspecified: Secondary | ICD-10-CM | POA: Diagnosis present

## 2022-01-08 DIAGNOSIS — I2510 Atherosclerotic heart disease of native coronary artery without angina pectoris: Secondary | ICD-10-CM | POA: Diagnosis present

## 2022-01-08 DIAGNOSIS — I509 Heart failure, unspecified: Principal | ICD-10-CM

## 2022-01-08 DIAGNOSIS — Z794 Long term (current) use of insulin: Secondary | ICD-10-CM

## 2022-01-08 DIAGNOSIS — Z8673 Personal history of transient ischemic attack (TIA), and cerebral infarction without residual deficits: Secondary | ICD-10-CM

## 2022-01-08 DIAGNOSIS — Z7902 Long term (current) use of antithrombotics/antiplatelets: Secondary | ICD-10-CM

## 2022-01-08 DIAGNOSIS — Z888 Allergy status to other drugs, medicaments and biological substances status: Secondary | ICD-10-CM

## 2022-01-08 DIAGNOSIS — E782 Mixed hyperlipidemia: Secondary | ICD-10-CM | POA: Diagnosis present

## 2022-01-08 DIAGNOSIS — Z79899 Other long term (current) drug therapy: Secondary | ICD-10-CM

## 2022-01-08 DIAGNOSIS — I493 Ventricular premature depolarization: Secondary | ICD-10-CM | POA: Diagnosis present

## 2022-01-08 DIAGNOSIS — J9622 Acute and chronic respiratory failure with hypercapnia: Secondary | ICD-10-CM | POA: Diagnosis present

## 2022-01-08 DIAGNOSIS — I10 Essential (primary) hypertension: Secondary | ICD-10-CM | POA: Diagnosis present

## 2022-01-08 DIAGNOSIS — I25810 Atherosclerosis of coronary artery bypass graft(s) without angina pectoris: Secondary | ICD-10-CM | POA: Diagnosis present

## 2022-01-08 LAB — BASIC METABOLIC PANEL
Anion gap: 9 (ref 5–15)
BUN: 48 mg/dL — ABNORMAL HIGH (ref 8–23)
CO2: 26 mmol/L (ref 22–32)
Calcium: 8.7 mg/dL — ABNORMAL LOW (ref 8.9–10.3)
Chloride: 103 mmol/L (ref 98–111)
Creatinine, Ser: 1.8 mg/dL — ABNORMAL HIGH (ref 0.61–1.24)
GFR, Estimated: 37 mL/min — ABNORMAL LOW (ref >60)
Glucose, Bld: 166 mg/dL — ABNORMAL HIGH (ref 70–99)
Potassium: 4.6 mmol/L (ref 3.5–5.1)
Sodium: 138 mmol/L (ref 135–145)

## 2022-01-08 LAB — CBC WITH DIFFERENTIAL/PLATELET
Abs Immature Granulocytes: 0.13 10*3/uL — ABNORMAL HIGH (ref 0.00–0.07)
Basophils Absolute: 0 10*3/uL (ref 0.0–0.1)
Basophils Relative: 0 %
Eosinophils Absolute: 0 10*3/uL (ref 0.0–0.5)
Eosinophils Relative: 0 %
HCT: 43 % (ref 39.0–52.0)
Hemoglobin: 13.2 g/dL (ref 13.0–17.0)
Immature Granulocytes: 1 %
Lymphocytes Relative: 11 %
Lymphs Abs: 2.3 10*3/uL (ref 0.7–4.0)
MCH: 29.5 pg (ref 26.0–34.0)
MCHC: 30.7 g/dL (ref 30.0–36.0)
MCV: 96 fL (ref 80.0–100.0)
Monocytes Absolute: 1 10*3/uL (ref 0.1–1.0)
Monocytes Relative: 5 %
Neutro Abs: 18 10*3/uL — ABNORMAL HIGH (ref 1.7–7.7)
Neutrophils Relative %: 83 %
Platelets: 231 10*3/uL (ref 150–400)
RBC: 4.48 MIL/uL (ref 4.22–5.81)
RDW: 17.1 % — ABNORMAL HIGH (ref 11.5–15.5)
WBC: 21.5 10*3/uL — ABNORMAL HIGH (ref 4.0–10.5)
nRBC: 0 % (ref 0.0–0.2)

## 2022-01-08 LAB — RESP PANEL BY RT-PCR (FLU A&B, COVID) ARPGX2
Influenza A by PCR: NEGATIVE
Influenza B by PCR: NEGATIVE
SARS Coronavirus 2 by RT PCR: NEGATIVE

## 2022-01-08 LAB — TROPONIN I (HIGH SENSITIVITY): Troponin I (High Sensitivity): 104 ng/L (ref <18)

## 2022-01-08 LAB — BRAIN NATRIURETIC PEPTIDE: B Natriuretic Peptide: 826 pg/mL — ABNORMAL HIGH (ref 0.0–100.0)

## 2022-01-08 MED ORDER — FUROSEMIDE 10 MG/ML IJ SOLN
40.0000 mg | Freq: Once | INTRAMUSCULAR | Status: AC
  Administered 2022-01-08: 40 mg via INTRAVENOUS
  Filled 2022-01-08: qty 4

## 2022-01-08 MED ORDER — METOPROLOL TARTRATE 5 MG/5ML IV SOLN
5.0000 mg | Freq: Once | INTRAVENOUS | Status: AC
  Administered 2022-01-08: 5 mg via INTRAVENOUS
  Filled 2022-01-08: qty 5

## 2022-01-08 MED ORDER — LIDOCAINE HCL (PF) 1 % IJ SOLN
INTRAMUSCULAR | Status: AC
  Filled 2022-01-08: qty 30

## 2022-01-08 NOTE — ED Triage Notes (Signed)
Pt was given 125mg  [facility]rol from [facility] ?

## 2022-01-08 NOTE — ED Provider Notes (Signed)
?[facility] ?Provider Note ? ? ?CSN: [id number] ?Arrival date & time: 01/08/22  2122 ? ?  ? ?History ? ?Chief Complaint  ?Patient presents with  ? Shortness of Breath  ? ? ?John [name] is a 81 y.o. male. ? ? Pt is an 81 yo with a pmhx significant for dm2, osa, chf, bph, cad, htn, hyperlipidemia, cva, and pvd.  He was admitted to [facility] from 3/5-3/10 for a NSTEMI and CHF.  Pt was d/c with 2 L oxygen via [place].  Pt also had aki and diuretics were held.  He has not been put back on them yet.  Pt has been very sob today.  He can't walk without getting very sob.  EMS gave him 125 mg solumedrol and put him on 6L oxygen via [place] to get O2 sats to the mid 90s.  No fever.  5 lb weight gain today. ?  ? ? ?  ? ?Home Medications ?Prior to Admission medications   ?Medication Sig Start Date End Date Taking? Authorizing Provider  ?albuterol (VENTOLIN HFA) 108 (90 Base) MCG/ACT inhaler Inhale 2 puffs into the lungs every 6 (six) hours as needed for wheezing or shortness of breath. 12/20/20   Pokhrel, Corrie Mckusick, MD  ?allopurinol (ZYLOPRIM) 300 MG tablet Take 300 mg by mouth daily. 06/21/12   [provider]  ?apixaban (ELIQUIS) 2.5 MG TABS tablet Take 1 tablet (2.5 mg total) by mouth 2 (two) times daily. 12/29/21   Little Ishikawa, MD  ?carvedilol (COREG) 12.5 MG tablet Take 1 tablet (12.5 mg total) by mouth 2 (two) times daily with a meal. 09/18/21   Lelon Perla, MD  ?cholecalciferol (VITAMIN D3) 25 MCG (1000 UNIT) tablet Take 1,000 Units by mouth daily.    [provider]  ?clopidogrel (PLAVIX) 75 MG tablet Take 1 tablet (75 mg total) by mouth daily. 12/29/21   Little Ishikawa, MD  ?Cyanocobalamin (VITAMIN B 12 PO) Take 1 tablet by mouth daily.    [provider]  ?folic acid (FOLVITE) 1 MG tablet Take 1 tablet (1 mg total) by mouth daily. 12/29/21   Little Ishikawa, MD  ?gabapentin (NEURONTIN) 300 MG capsule Take 1 capsule (300 mg total) by mouth at bedtime. 12/29/21    Little Ishikawa, MD  ?hydrALAZINE (APRESOLINE) 25 MG tablet Take 1.5 tablets (37.5 mg total) by mouth 2 (two) times daily. 12/29/21   Little Ishikawa, MD  ?HYDROcodone-acetaminophen (NORCO/VICODIN) 5-325 MG tablet Take 1 tablet by mouth every 4 (four) hours as needed for moderate pain. 12/29/21 12/29/22  Little Ishikawa, MD  ?insulin glargine (LANTUS SOLOSTAR) 100 UNIT/ML Solostar Pen Inject 50 Units into the skin daily. Dose per sliding scale. 12/29/21   Little Ishikawa, MD  ?Insulin Pen Needle 32G X 4 MM MISC Use to inject insulin up to 4 times daily. 12/29/21   Little Ishikawa, MD  ?isosorbide dinitrate (ISORDIL) 20 MG tablet Take 1 tablet (20 mg total) by mouth 2 (two) times daily. 12/29/21   Little Ishikawa, MD  ?Multiple Vitamins-Minerals (CERTAVITE/ANTIOXIDANTS) TABS Take 1 tablet by mouth daily. 12/29/21   Little Ishikawa, MD  ?pantoprazole (PROTONIX) 40 MG tablet Take 40 mg by mouth daily with breakfast. 08/19/14   [provider]  ?predniSONE (DELTASONE) 10 MG tablet Take 4 tablets (40 mg total) by mouth daily for 3 days, THEN 3 tablets (30 mg total) daily for 3 days, THEN 2 tablets (20 mg total) daily for 3 days, THEN  1 tablet (10 mg total) daily for 3 days. 12/29/21 01/10/22  Little Ishikawa, MD  ?rosuvastatin (CRESTOR) 10 MG tablet Take 1 tablet (10 mg total) by mouth every evening. 12/29/21   Little Ishikawa, MD  ?senna (SENOKOT) 8.6 MG tablet Take 2-3 tablets by mouth daily as needed for constipation.    [provider]  ?sertraline (ZOLOFT) 25 MG tablet Take 1 tablet (25 mg total) by mouth every evening. 01/09/21   Thayer Headings, PMHNP  ?tamsulosin (FLOMAX) 0.4 MG CAPS capsule Take 0.4 mg by mouth daily. 09/22/14   [provider]  ?thiamine 100 MG tablet Take 1 tablet (100 mg total) by mouth daily. 12/30/21   Little Ishikawa, MD  ?   ? ?Allergies    ?Brilinta [ticagrelor], Penicillins, and Ezetimibe-simvastatin   ? ?Review of Systems    ?Review of Systems  ?Respiratory:  Positive for shortness of breath.   ?All other systems reviewed and are negative. ? ?Physical Exam ?Updated Vital Signs ?BP (!) 147/87   Pulse 86   Temp 97.7 ?F (36.5 ?C) (Oral)   Resp (!) 26   Ht 5\' 6"  (1.676 m)   Wt 89.3 kg   SpO2 94%   BMI 31.78 kg/m?  ?Physical Exam ?Vitals and nursing note reviewed.  ?Constitutional:   ?   General: He is in acute distress.  ?   Appearance: He is well-developed. He is ill-appearing.  ?HENT:  ?   Head: Normocephalic and atraumatic.  ?   Mouth/Throat:  ?   Mouth: Mucous membranes are moist.  ?   Pharynx: Oropharynx is clear.  ?Eyes:  ?   Extraocular Movements: Extraocular movements intact.  ?   Pupils: Pupils are equal, round, and reactive to light.  ?Cardiovascular:  ?   Rate and Rhythm: Normal rate and regular rhythm.  ?Pulmonary:  ?   Effort: Tachypnea present.  ?   Breath sounds: Rhonchi present.  ?Abdominal:  ?   General: Bowel sounds are normal.  ?   Palpations: Abdomen is soft.  ?Musculoskeletal:     ?   General: Normal range of motion.  ?   Cervical back: Normal range of motion and neck supple.  ?Skin: ?   General: Skin is warm.  ?   Capillary Refill: Capillary refill takes less than 2 seconds.  ?Neurological:  ?   General: No focal deficit present.  ?   Mental Status: He is alert and oriented to person, place, and time.  ?Psychiatric:     ?   Mood and Affect: Mood normal.     ?   Behavior: Behavior normal.  ? ? ?ED Results / Procedures / Treatments   ?Labs ?(all labs ordered are listed, but only abnormal results are displayed) ?Labs Reviewed  ?BASIC METABOLIC PANEL - Abnormal; Notable for the following components:  ?    Result Value  ? Glucose, Bld 166 (*)   ? BUN 48 (*)   ? Creatinine, Ser 1.80 (*)   ? Calcium 8.7 (*)   ? GFR, Estimated 37 (*)   ? All other components within normal limits  ?BRAIN NATRIURETIC PEPTIDE - Abnormal; Notable for the following components:  ? B Natriuretic Peptide 826.0 (*)   ? All other components  within normal limits  ?CBC WITH DIFFERENTIAL/PLATELET - Abnormal; Notable for the following components:  ? WBC 21.5 (*)   ? RDW 17.1 (*)   ? Neutro Abs 18.0 (*)   ? Abs Immature Granulocytes 0.13 (*)   ?  All other components within normal limits  ?TROPONIN I (HIGH SENSITIVITY) - Abnormal; Notable for the following components:  ? Troponin I (High Sensitivity) 104 (*)   ? All other components within normal limits  ?RESP PANEL BY RT-PCR (FLU A&B, COVID) ARPGX2  ?URINALYSIS, ROUTINE W REFLEX MICROSCOPIC  ?COMPREHENSIVE METABOLIC PANEL  ?CBC WITH DIFFERENTIAL/PLATELET  ?MAGNESIUM  ?TROPONIN I (HIGH SENSITIVITY)  ? ? ?EKG ?EKG Interpretation ? ?Date/Time:  Monday January 08 2022 21:26:35 EDT ?Ventricular Rate:  100 ?PR Interval:  148 ?QRS Duration: 103 ?QT Interval:  327 ?QTC Calculation: 422 ?R Axis:   84 ?Text Interpretation: Sinus tachycardia Ventricular trigeminy Borderline right axis deviation Anteroseptal infarct, old Nonspecific repol abnormality, diffuse leads Confirmed by Isla Pence [phone]) on 01/08/2022 10:30:54 PM ? ?Radiology ?DG Chest Port 1 View ? ?Result Date: 01/08/2022 ?CLINICAL DATA:  sob EXAM: PORTABLE CHEST 1 VIEW COMPARISON:  Chest x-ray 12/24/2021. CT chest 12/24/2021 FINDINGS: The heart and mediastinal contours are unchanged. Aortic calcification. Coronary artery stent. No focal consolidation. Chronic coarsened intra markings with superimposed a patchy interstitial and airspace opacities. Pulmonary edema. Trace left and trace to small volume right pleural effusions. No pneumothorax. No acute osseous abnormality. IMPRESSION: 1. Chronic coarsened intra markings with superimposed a patchy interstitial and airspace opacities. Finding could represent infection in relation versus pulmonary edema. Followup PA and lateral chest X-ray is recommended in 3-4 weeks following therapy to ensure resolution. 2. Trace left and trace to small volume right pleural effusions. 3.  Aortic Atherosclerosis (ICD10-I70.0).  Electronically Signed   By: Iven Finn M.D.   On: 01/08/2022 22:43   ? ?Procedures ?Procedures  ? ? ?Medications Ordered in ED ?Medications  ?lidocaine (PF) (XYLOCAINE) 1 % injection (  Not Given 01/08/22 2336

## 2022-01-08 NOTE — ED Triage Notes (Signed)
Pt c/o increased sob tonight.  ?

## 2022-01-08 NOTE — Telephone Encounter (Signed)
Spoke with pt daughter, questions answered and form to be signed. ?

## 2022-01-09 ENCOUNTER — Encounter (HOSPITAL_COMMUNITY): Payer: Self-pay | Admitting: Family Medicine

## 2022-01-09 DIAGNOSIS — I255 Ischemic cardiomyopathy: Secondary | ICD-10-CM | POA: Diagnosis present

## 2022-01-09 DIAGNOSIS — I493 Ventricular premature depolarization: Secondary | ICD-10-CM | POA: Diagnosis present

## 2022-01-09 DIAGNOSIS — I25700 Atherosclerosis of coronary artery bypass graft(s), unspecified, with unstable angina pectoris: Secondary | ICD-10-CM | POA: Diagnosis not present

## 2022-01-09 DIAGNOSIS — E11649 Type 2 diabetes mellitus with hypoglycemia without coma: Secondary | ICD-10-CM | POA: Diagnosis not present

## 2022-01-09 DIAGNOSIS — I700 Atherosclerosis of aorta: Secondary | ICD-10-CM | POA: Diagnosis present

## 2022-01-09 DIAGNOSIS — D72829 Elevated white blood cell count, unspecified: Secondary | ICD-10-CM

## 2022-01-09 DIAGNOSIS — Z20822 Contact with and (suspected) exposure to covid-19: Secondary | ICD-10-CM | POA: Diagnosis present

## 2022-01-09 DIAGNOSIS — Z8673 Personal history of transient ischemic attack (TIA), and cerebral infarction without residual deficits: Secondary | ICD-10-CM | POA: Diagnosis not present

## 2022-01-09 DIAGNOSIS — N179 Acute kidney failure, unspecified: Secondary | ICD-10-CM | POA: Diagnosis not present

## 2022-01-09 DIAGNOSIS — K219 Gastro-esophageal reflux disease without esophagitis: Secondary | ICD-10-CM | POA: Diagnosis present

## 2022-01-09 DIAGNOSIS — N183 Chronic kidney disease, stage 3 unspecified: Secondary | ICD-10-CM

## 2022-01-09 DIAGNOSIS — F419 Anxiety disorder, unspecified: Secondary | ICD-10-CM | POA: Diagnosis present

## 2022-01-09 DIAGNOSIS — I10 Essential (primary) hypertension: Secondary | ICD-10-CM | POA: Diagnosis not present

## 2022-01-09 DIAGNOSIS — E1122 Type 2 diabetes mellitus with diabetic chronic kidney disease: Secondary | ICD-10-CM | POA: Diagnosis present

## 2022-01-09 DIAGNOSIS — I5021 Acute systolic (congestive) heart failure: Secondary | ICD-10-CM | POA: Diagnosis not present

## 2022-01-09 DIAGNOSIS — E785 Hyperlipidemia, unspecified: Secondary | ICD-10-CM | POA: Diagnosis not present

## 2022-01-09 DIAGNOSIS — E782 Mixed hyperlipidemia: Secondary | ICD-10-CM

## 2022-01-09 DIAGNOSIS — I5020 Unspecified systolic (congestive) heart failure: Secondary | ICD-10-CM | POA: Diagnosis not present

## 2022-01-09 DIAGNOSIS — I130 Hypertensive heart and chronic kidney disease with heart failure and stage 1 through stage 4 chronic kidney disease, or unspecified chronic kidney disease: Secondary | ICD-10-CM | POA: Diagnosis present

## 2022-01-09 DIAGNOSIS — I480 Paroxysmal atrial fibrillation: Secondary | ICD-10-CM | POA: Diagnosis present

## 2022-01-09 DIAGNOSIS — I214 Non-ST elevation (NSTEMI) myocardial infarction: Secondary | ICD-10-CM | POA: Diagnosis present

## 2022-01-09 DIAGNOSIS — N400 Enlarged prostate without lower urinary tract symptoms: Secondary | ICD-10-CM | POA: Diagnosis present

## 2022-01-09 DIAGNOSIS — I25810 Atherosclerosis of coronary artery bypass graft(s) without angina pectoris: Secondary | ICD-10-CM | POA: Diagnosis present

## 2022-01-09 DIAGNOSIS — E119 Type 2 diabetes mellitus without complications: Secondary | ICD-10-CM

## 2022-01-09 DIAGNOSIS — T380X5A Adverse effect of glucocorticoids and synthetic analogues, initial encounter: Secondary | ICD-10-CM | POA: Diagnosis present

## 2022-01-09 DIAGNOSIS — I5043 Acute on chronic combined systolic (congestive) and diastolic (congestive) heart failure: Secondary | ICD-10-CM

## 2022-01-09 DIAGNOSIS — G4733 Obstructive sleep apnea (adult) (pediatric): Secondary | ICD-10-CM | POA: Diagnosis present

## 2022-01-09 DIAGNOSIS — J9601 Acute respiratory failure with hypoxia: Secondary | ICD-10-CM | POA: Diagnosis present

## 2022-01-09 DIAGNOSIS — I5033 Acute on chronic diastolic (congestive) heart failure: Secondary | ICD-10-CM | POA: Diagnosis not present

## 2022-01-09 DIAGNOSIS — J9621 Acute and chronic respiratory failure with hypoxia: Secondary | ICD-10-CM

## 2022-01-09 DIAGNOSIS — N1832 Chronic kidney disease, stage 3b: Secondary | ICD-10-CM | POA: Diagnosis present

## 2022-01-09 DIAGNOSIS — I2510 Atherosclerotic heart disease of native coronary artery without angina pectoris: Secondary | ICD-10-CM | POA: Diagnosis present

## 2022-01-09 DIAGNOSIS — Z794 Long term (current) use of insulin: Secondary | ICD-10-CM

## 2022-01-09 DIAGNOSIS — I252 Old myocardial infarction: Secondary | ICD-10-CM | POA: Diagnosis not present

## 2022-01-09 DIAGNOSIS — F32A Depression, unspecified: Secondary | ICD-10-CM | POA: Diagnosis present

## 2022-01-09 LAB — COMPREHENSIVE METABOLIC PANEL
ALT: 27 U/L (ref 0–44)
AST: 19 U/L (ref 15–41)
Albumin: 3.4 g/dL — ABNORMAL LOW (ref 3.5–5.0)
Alkaline Phosphatase: 47 U/L (ref 38–126)
Anion gap: 9 (ref 5–15)
BUN: 52 mg/dL — ABNORMAL HIGH (ref 8–23)
CO2: 28 mmol/L (ref 22–32)
Calcium: 8.8 mg/dL — ABNORMAL LOW (ref 8.9–10.3)
Chloride: 103 mmol/L (ref 98–111)
Creatinine, Ser: 1.96 mg/dL — ABNORMAL HIGH (ref 0.61–1.24)
GFR, Estimated: 34 mL/min — ABNORMAL LOW (ref >60)
Glucose, Bld: 134 mg/dL — ABNORMAL HIGH (ref 70–99)
Potassium: 5.4 mmol/L — ABNORMAL HIGH (ref 3.5–5.1)
Sodium: 140 mmol/L (ref 135–145)
Total Bilirubin: 0.7 mg/dL (ref 0.3–1.2)
Total Protein: 6.3 g/dL — ABNORMAL LOW (ref 6.5–8.1)

## 2022-01-09 LAB — CBC WITH DIFFERENTIAL/PLATELET
Abs Immature Granulocytes: 0.07 10*3/uL (ref 0.00–0.07)
Basophils Absolute: 0 10*3/uL (ref 0.0–0.1)
Basophils Relative: 0 %
Eosinophils Absolute: 0 10*3/uL (ref 0.0–0.5)
Eosinophils Relative: 0 %
HCT: 41 % (ref 39.0–52.0)
Hemoglobin: 12.1 g/dL — ABNORMAL LOW (ref 13.0–17.0)
Immature Granulocytes: 1 %
Lymphocytes Relative: 3 %
Lymphs Abs: 0.3 10*3/uL — ABNORMAL LOW (ref 0.7–4.0)
MCH: 28.3 pg (ref 26.0–34.0)
MCHC: 29.5 g/dL — ABNORMAL LOW (ref 30.0–36.0)
MCV: 95.8 fL (ref 80.0–100.0)
Monocytes Absolute: 0.1 10*3/uL (ref 0.1–1.0)
Monocytes Relative: 1 %
Neutro Abs: 12.2 10*3/uL — ABNORMAL HIGH (ref 1.7–7.7)
Neutrophils Relative %: 95 %
Platelets: 185 10*3/uL (ref 150–400)
RBC: 4.28 MIL/uL (ref 4.22–5.81)
RDW: 17.2 % — ABNORMAL HIGH (ref 11.5–15.5)
WBC: 12.7 10*3/uL — ABNORMAL HIGH (ref 4.0–10.5)
nRBC: 0 % (ref 0.0–0.2)

## 2022-01-09 LAB — MAGNESIUM: Magnesium: 2.3 mg/dL (ref 1.7–2.4)

## 2022-01-09 LAB — GLUCOSE, CAPILLARY
Glucose-Capillary: 135 mg/dL — ABNORMAL HIGH (ref 70–99)
Glucose-Capillary: 158 mg/dL — ABNORMAL HIGH (ref 70–99)
Glucose-Capillary: 177 mg/dL — ABNORMAL HIGH (ref 70–99)
Glucose-Capillary: 266 mg/dL — ABNORMAL HIGH (ref 70–99)
Glucose-Capillary: 301 mg/dL — ABNORMAL HIGH (ref 70–99)
Glucose-Capillary: 375 mg/dL — ABNORMAL HIGH (ref 70–99)

## 2022-01-09 LAB — URINALYSIS, ROUTINE W REFLEX MICROSCOPIC
Bilirubin Urine: NEGATIVE
Glucose, UA: 150 mg/dL — AB
Hgb urine dipstick: NEGATIVE
Ketones, ur: NEGATIVE mg/dL
Leukocytes,Ua: NEGATIVE
Nitrite: NEGATIVE
Protein, ur: 100 mg/dL — AB
Specific Gravity, Urine: 1.021 (ref 1.005–1.030)
pH: 5 (ref 5.0–8.0)

## 2022-01-09 LAB — TROPONIN I (HIGH SENSITIVITY): Troponin I (High Sensitivity): 118 ng/L (ref <18)

## 2022-01-09 MED ORDER — APIXABAN 2.5 MG PO TABS
2.5000 mg | ORAL_TABLET | Freq: Two times a day (BID) | ORAL | Status: DC
  Administered 2022-01-09 – 2022-01-10 (×4): 2.5 mg via ORAL
  Filled 2022-01-09 (×4): qty 1

## 2022-01-09 MED ORDER — ROSUVASTATIN CALCIUM 10 MG PO TABS
10.0000 mg | ORAL_TABLET | Freq: Every evening | ORAL | Status: DC
  Administered 2022-01-09: 10 mg via ORAL
  Filled 2022-01-09: qty 1

## 2022-01-09 MED ORDER — ALUM & MAG HYDROXIDE-SIMETH 200-200-20 MG/5ML PO SUSP
30.0000 mL | Freq: Once | ORAL | Status: AC
  Administered 2022-01-09: 30 mL via ORAL
  Filled 2022-01-09: qty 30

## 2022-01-09 MED ORDER — SENNA 8.6 MG PO TABS: 1.0000 | ORAL_TABLET | Freq: Every day | ORAL | Status: DC | PRN

## 2022-01-09 MED ORDER — OXYCODONE HCL 5 MG PO TABS
5.0000 mg | ORAL_TABLET | ORAL | Status: DC | PRN
  Administered 2022-01-09 (×2): 5 mg via ORAL
  Filled 2022-01-09 (×2): qty 1

## 2022-01-09 MED ORDER — ALUM & MAG HYDROXIDE-SIMETH 200-200-20 MG/5ML PO SUSP: 30.0000 mL | Freq: Four times a day (QID) | ORAL | Status: DC | PRN

## 2022-01-09 MED ORDER — THIAMINE HCL 100 MG/ML IJ SOLN: 100.0000 mg | Freq: Every day | INTRAMUSCULAR | Status: DC

## 2022-01-09 MED ORDER — ADULT MULTIVITAMIN W/MINERALS CH
1.0000 | ORAL_TABLET | Freq: Every day | ORAL | Status: DC
  Administered 2022-01-09 – 2022-01-10 (×2): 1 via ORAL
  Filled 2022-01-09 (×2): qty 1

## 2022-01-09 MED ORDER — ISOSORBIDE DINITRATE 20 MG PO TABS
20.0000 mg | ORAL_TABLET | Freq: Two times a day (BID) | ORAL | Status: DC
  Administered 2022-01-09 – 2022-01-10 (×4): 20 mg via ORAL
  Filled 2022-01-09 (×8): qty 1

## 2022-01-09 MED ORDER — INSULIN ASPART 100 UNIT/ML IJ SOLN
0.0000 [IU] | Freq: Three times a day (TID) | INTRAMUSCULAR | Status: DC
  Administered 2022-01-09: 11 [IU] via SUBCUTANEOUS
  Administered 2022-01-09: 2 [IU] via SUBCUTANEOUS
  Administered 2022-01-09: 3 [IU] via SUBCUTANEOUS

## 2022-01-09 MED ORDER — FUROSEMIDE 10 MG/ML IJ SOLN
40.0000 mg | Freq: Two times a day (BID) | INTRAMUSCULAR | Status: DC
  Administered 2022-01-09: 40 mg via INTRAVENOUS
  Filled 2022-01-09: qty 4

## 2022-01-09 MED ORDER — POLYETHYLENE GLYCOL 3350 17 G PO PACK: 17.0000 g | PACK | Freq: Every day | ORAL | Status: DC | PRN

## 2022-01-09 MED ORDER — SERTRALINE HCL 50 MG PO TABS
25.0000 mg | ORAL_TABLET | Freq: Every evening | ORAL | Status: DC
  Administered 2022-01-09: 25 mg via ORAL
  Filled 2022-01-09: qty 1

## 2022-01-09 MED ORDER — PANTOPRAZOLE SODIUM 40 MG PO TBEC
40.0000 mg | DELAYED_RELEASE_TABLET | Freq: Every day | ORAL | Status: DC
  Administered 2022-01-09 – 2022-01-10 (×2): 40 mg via ORAL
  Filled 2022-01-09 (×2): qty 1

## 2022-01-09 MED ORDER — LIDOCAINE VISCOUS HCL 2 % MT SOLN: 15.0000 mL | Freq: Four times a day (QID) | OROMUCOSAL | Status: DC | PRN

## 2022-01-09 MED ORDER — MELATONIN 3 MG PO TABS: 6.0000 mg | ORAL_TABLET | Freq: Every day | ORAL | Status: DC

## 2022-01-09 MED ORDER — LORAZEPAM 1 MG PO TABS: 1.0000 mg | ORAL_TABLET | ORAL | Status: DC | PRN

## 2022-01-09 MED ORDER — ALBUTEROL SULFATE (2.5 MG/3ML) 0.083% IN NEBU: 2.5000 mg | INHALATION_SOLUTION | RESPIRATORY_TRACT | Status: DC | PRN

## 2022-01-09 MED ORDER — INSULIN DETEMIR 100 UNIT/ML ~~LOC~~ SOLN
40.0000 [IU] | Freq: Every day | SUBCUTANEOUS | Status: DC
  Administered 2022-01-09: 40 [IU] via SUBCUTANEOUS
  Filled 2022-01-09 (×3): qty 0.4

## 2022-01-09 MED ORDER — ACETAMINOPHEN 650 MG RE SUPP: 650.0000 mg | Freq: Four times a day (QID) | RECTAL | Status: DC | PRN

## 2022-01-09 MED ORDER — ONDANSETRON HCL 4 MG/2ML IJ SOLN: 4.0000 mg | Freq: Four times a day (QID) | INTRAMUSCULAR | Status: DC | PRN

## 2022-01-09 MED ORDER — LORAZEPAM 2 MG/ML IJ SOLN: 1.0000 mg | INTRAMUSCULAR | Status: DC | PRN

## 2022-01-09 MED ORDER — ALLOPURINOL 100 MG PO TABS
300.0000 mg | ORAL_TABLET | Freq: Every day | ORAL | Status: DC
  Administered 2022-01-09 – 2022-01-10 (×2): 300 mg via ORAL
  Filled 2022-01-09 (×2): qty 3

## 2022-01-09 MED ORDER — ACETAMINOPHEN 325 MG PO TABS: 650.0000 mg | ORAL_TABLET | Freq: Four times a day (QID) | ORAL | Status: DC | PRN

## 2022-01-09 MED ORDER — CLOPIDOGREL BISULFATE 75 MG PO TABS
75.0000 mg | ORAL_TABLET | Freq: Every day | ORAL | Status: DC
  Administered 2022-01-09 – 2022-01-10 (×2): 75 mg via ORAL
  Filled 2022-01-09 (×2): qty 1

## 2022-01-09 MED ORDER — CARVEDILOL 12.5 MG PO TABS
12.5000 mg | ORAL_TABLET | Freq: Two times a day (BID) | ORAL | Status: DC
  Administered 2022-01-09 – 2022-01-10 (×3): 12.5 mg via ORAL
  Filled 2022-01-09 (×3): qty 1

## 2022-01-09 MED ORDER — HYDRALAZINE HCL 25 MG PO TABS
37.5000 mg | ORAL_TABLET | Freq: Two times a day (BID) | ORAL | Status: DC
  Administered 2022-01-09 – 2022-01-10 (×4): 37.5 mg via ORAL
  Filled 2022-01-09 (×4): qty 2

## 2022-01-09 MED ORDER — THIAMINE HCL 100 MG PO TABS
100.0000 mg | ORAL_TABLET | Freq: Every day | ORAL | Status: DC
  Filled 2022-01-09: qty 1

## 2022-01-09 MED ORDER — CHLORHEXIDINE GLUCONATE CLOTH 2 % EX PADS
6.0000 | MEDICATED_PAD | Freq: Every day | CUTANEOUS | Status: DC
  Administered 2022-01-10: 6 via TOPICAL

## 2022-01-09 MED ORDER — LIDOCAINE VISCOUS HCL 2 % MT SOLN
15.0000 mL | Freq: Once | OROMUCOSAL | Status: AC
  Administered 2022-01-09: 15 mL via ORAL
  Filled 2022-01-09: qty 15

## 2022-01-09 MED ORDER — FOLIC ACID 1 MG PO TABS
1.0000 mg | ORAL_TABLET | Freq: Every day | ORAL | Status: DC
  Administered 2022-01-09 – 2022-01-10 (×2): 1 mg via ORAL
  Filled 2022-01-09 (×2): qty 1

## 2022-01-09 MED ORDER — GABAPENTIN 300 MG PO CAPS
300.0000 mg | ORAL_CAPSULE | Freq: Every day | ORAL | Status: DC
  Administered 2022-01-09 (×2): 300 mg via ORAL
  Filled 2022-01-09 (×2): qty 1

## 2022-01-09 MED ORDER — LIVING BETTER WITH HEART FAILURE BOOK: Freq: Once | Status: AC

## 2022-01-09 MED ORDER — THIAMINE HCL 100 MG PO TABS
100.0000 mg | ORAL_TABLET | Freq: Every day | ORAL | Status: DC
  Administered 2022-01-09 – 2022-01-10 (×2): 100 mg via ORAL
  Filled 2022-01-09: qty 1

## 2022-01-09 MED ORDER — PREDNISONE 20 MG PO TABS
20.0000 mg | ORAL_TABLET | Freq: Every day | ORAL | Status: AC
  Administered 2022-01-09: 20 mg via ORAL
  Filled 2022-01-09: qty 1

## 2022-01-09 MED ORDER — ONDANSETRON HCL 4 MG PO TABS: 4.0000 mg | ORAL_TABLET | Freq: Four times a day (QID) | ORAL | Status: DC | PRN

## 2022-01-09 MED ORDER — TAMSULOSIN HCL 0.4 MG PO CAPS
0.4000 mg | ORAL_CAPSULE | Freq: Every day | ORAL | Status: DC
  Administered 2022-01-09 – 2022-01-10 (×2): 0.4 mg via ORAL
  Filled 2022-01-09 (×2): qty 1

## 2022-01-09 NOTE — Assessment & Plan Note (Signed)
Continue Isodril, Crestor, Coreg ?Continue to monitor ?

## 2022-01-09 NOTE — Assessment & Plan Note (Addendum)
Continue hydralazine and Coreg ?-Has had torsemide added ?Continue monitor blood pressures per protocol and with PCP ? ?

## 2022-01-09 NOTE — Assessment & Plan Note (Addendum)
50 units basal insulin at home and was receiving 40 units here but will cut down to 30.  Patient does not take 50 units of time. ?Reduced dose while in hospital ?Monitor CBG carefully as he was hypoglycemic this morning.  Repeat CBG is improved and ranged from 78-131 ?-Resume home meds in the outpatient setting and continue with freestyle libre monitoring ?

## 2022-01-09 NOTE — Assessment & Plan Note (Addendum)
Secondary to CHF exacerbation ?Patient wears 2 L nasal cannula at home since last hospitalization ?Was initially requiring 6 L nasal cannula in the ER to maintain oxygen sats ?-SpO2: 98 % ?O2 Flow Rate (L/min): 6 L/min; was weaned down to 3 L and ambulated and did well ?Continue oxygen supplementation as needed ?Wean off as tolerated and continue home O2 regimen ?-Continue with torsemide started by cardiology ?Continue to monitor carefully and repeat chest x-ray in 3 to 6 weeks ?

## 2022-01-09 NOTE — Progress Notes (Signed)
I have seen and assessed patient and I agree with Dr.Zierle-Ghosh assessment and plan.  Patient is a 81 year old gentleman history of BPH, anxiety, CAD, depression, type 2 diabetes, GERD, hyperlipidemia, hypertension with recent diagnosis of a non-STEMI and CHF and discharged on 12/29/2021 presented back to the ED with dyspnea on exertion, orthopnea.  Patient noted to have elevated BNP and in an acute CHF exacerbation.  Patient noted on discharge to have had his diuretics discontinued secondary to acute renal failure.  Has not had an appointment yet to follow-up with cardiology for resumption of diuretics.  Continue diuresis with IV Lasix.  Due to patient's complex cardiac history cardiology consulted for further evaluation and management. ? ?No charge. ?

## 2022-01-09 NOTE — Assessment & Plan Note (Signed)
Continue Crestor 

## 2022-01-09 NOTE — Evaluation (Signed)
Occupational Therapy Evaluation ?Patient Details ?Name: John [name] ?MRN: [id number] ?DOB: 08-14-1941 ?Today's Date: 01/09/2022 ? ? ?History of Present Illness John [name] is a 81 y.o. male with medical history significant of anxiety, BPH, coronary artery disease, depression, diabetes mellitus type 2, GERD, hyperlipidemia, hypertension, and more presents to ED with a chief complaint of dyspnea.  Patient reports it was gradual onset and started around 3 PM this afternoon.  He had 5 pounds of weight gain during the day.  He he has dyspnea on exertion and orthopnea.  Patient denies cough, chest pain.  He reports he does have palpitations at times but they are vague and the duration is unknown.  Patient denies any peripheral edema, but reports it is normal for him to get swelling in his abdomen which he does feel.  He denies any chest pain.  He reports that since being taken off of Lasix his urine output is decreased.  Patient has no other complaints at this time. (taken per md note)  ? ?Clinical Impression ?  ?Pt agreeable to OT evaluation. Pt presents on 5 L supplemental O2 and was able to stay above 90% SpO2 throughout session. Pt is operating at level of modified independence for mobility but with labored movement and reports that ADL's take extended time and labored effort. Pt demonstrates good B UE strength and fair balance using RW. Pt is recommended to continue with Home health OT as pt reported he was already having OT come to the house prior to admission. Pt is not recommended for further acute OT services and will be discharged to care of nursing staff for remaining length of stay.  ?   ? ?Recommendations for follow up therapy are one component of a multi-disciplinary discharge planning process, led by the attending physician.  Recommendations may be updated based on patient status, additional functional criteria and insurance authorization.  ? ?Follow Up Recommendations ? Home health OT  ?  ?Assistance  Recommended at Discharge PRN  ?Patient can return home with the following Assistance with cooking/housework;Assist for transportation;Help with stairs or ramp for entrance ? ?  ?Functional Status Assessment ? Patient has had a recent decline in their functional status and demonstrates the ability to make significant improvements in function in a reasonable and predictable amount of time.  ?Equipment Recommendations ? None recommended by OT  ?  ?Recommendations for Other Services   ? ? ?  ?Precautions / Restrictions Precautions ?Precautions: Fall ?Restrictions ?Weight Bearing Restrictions: No  ? ?  ? ?Mobility Bed Mobility ?Overal bed mobility: Modified Independent ?  ?  ?  ?  ?  ?  ?General bed mobility comments: supine to sit ?  ? ?Transfers ?Overall transfer level: Modified independent ?  ?  ?  ?  ?  ?  ?  ?  ?General transfer comment: Pt able to transfer to toilet and back to chair with modified independence. using RW ?  ? ?  ?Balance Overall balance assessment: Needs assistance ?Sitting-balance support: Feet supported, No upper extremity supported ?Sitting balance-Leahy Scale: Good ?Sitting balance - Comments: seated EOB ?  ?Standing balance support: During functional activity, Bilateral upper extremity supported, Reliant on assistive device for balance ?Standing balance-Leahy Scale: Fair ?Standing balance comment: using RW ?  ?  ?  ?  ?  ?  ?  ?  ?  ?  ?  ?   ? ?ADL either performed or assessed with clinical judgement  ? ?ADL Overall ADL's : Modified independent ?  ?  ?  ?  ?  ?  ?  ?  ?  ?  ?  ?  ?  ?  ?  ?  ?  ?  ?  ?  General ADL Comments: Pt able to complete toilet transfer and donning of socks with modified indepdnence via mild labored movement and use of RW.  ? ? ? ?Vision Baseline Vision/History: 1 Wears glasses ?Ability to See in Adequate Light: 0 Adequate ?Patient Visual Report: No change from baseline ?Vision Assessment?: No apparent visual deficits  ?   ?   ?  ?   ?  ? ?Pertinent Vitals/Pain Pain  Assessment ?Pain Assessment: No/denies pain  ? ? ? ?Hand Dominance Right ?  ?Extremity/Trunk Assessment Upper Extremity Assessment ?Upper Extremity Assessment: Overall WFL for tasks assessed ?  ?Lower Extremity Assessment ?Lower Extremity Assessment: Defer to PT evaluation ?  ?Cervical / Trunk Assessment ?Cervical / Trunk Assessment: Normal ?  ?Communication Communication ?Communication: No difficulties ?  ?Cognition Arousal/Alertness: Awake/alert ?Behavior During Therapy: [facility] for tasks assessed/performed ?Overall Cognitive Status: Within Functional Limits for tasks assessed ?  ?  ?  ?  ?  ?  ?  ?  ?  ?  ?  ?  ?  ?  ?  ?  ?  ?  ?  ?   ? ?  ?   ?  ?    ? ? ?Home Living Family/patient expects to be discharged to:: Private residence ?Living Arrangements: Children ?Available Help at Discharge: Available PRN/intermittently ?Type of Home: House ?Home Access: Stairs to enter ?Entrance Stairs-Number of Steps: 3 ?Entrance Stairs-Rails: Left ?Home Layout: One level ?  ?  ?Bathroom Shower/Tub: Walk-in shower ?  ?Bathroom Toilet: Standard ?Bathroom Accessibility: Yes ?  ?Home Equipment: Cane - single point;Rollator (4 wheels);Shower seat;Wheelchair - manual ?  ?Additional Comments: Daughter and SIL work (Pt reported no change from recent admission. Information carried over.) ?  ? ?  ?Prior Functioning/Environment Prior Level of Function : Needs assist ?  ?  ?  ?Physical Assist : ADLs (physical) ?  ?ADLs (physical): IADLs ?Mobility Comments: use of Rollator for mobility; pt can drive ?ADLs Comments: Modified independent for ADL's ; family assisting with IADL's. ?  ? ?  ?  ?   ?  ?   ?    ?  ?OT Goals(Current goals can be found in the care plan section) Acute Rehab OT Goals ?Patient Stated Goal: return home  ?OT Frequency:   ?  ? ?   ?  ?  ?  ?  ? ?  ?AM-PAC OT "6 Clicks" Daily Activity     ?Outcome Measure Help from another person eating meals?: None ?Help from another person taking care of personal grooming?: None ?Help from  another person toileting, which includes using toliet, bedpan, or urinal?: None ?Help from another person bathing (including washing, rinsing, drying)?: None ?Help from another person to put on and taking off regular upper body clothing?: None ?Help from another person to put on and taking off regular lower body clothing?: None ?6 Click Score: 24 ?  ?End of Session Equipment Utilized During Treatment: Rolling walker (2 wheels);Oxygen ? ?Activity Tolerance: Patient tolerated treatment well ?Patient left: in chair;with call bell/phone within reach ? ?OT Visit Diagnosis: Unsteadiness on feet (R26.81);Other abnormalities of gait and mobility (R26.89);Other (comment) (shortness of breath)  ?              ?Time: 1013-1030 ?OT Time Calculation (min): 17 min ?Charges:  OT General Charges ?$OT Visit: 1 Visit ?OT Evaluation ?$OT Eval Low Complexity: 1 Low ? ?Larey Seat OT, MOT ? ?Larey Seat ?01/09/2022, 10:54 AM ?

## 2022-01-09 NOTE — Assessment & Plan Note (Addendum)
Last echo earlier this month showed an ejection fraction of 35% ?Strict I's and O's patient is -3.14 L since admission ?Daily weights: Weight was not done ?Fluid restriction ?Patient was taking 40 mg of Lasix daily at home, but that medication was discontinued at the last admission ?Continue 40 mg of IV Lasix twice daily this was held given his renal function slightly going up ?Chest x-ray shows infection versus pulmonary edema ?Patient reports dyspnea on exertion and orthopnea ?Continue to monitor ?-Discharged home on p.o. torsemide as he is improved ?

## 2022-01-09 NOTE — Progress Notes (Addendum)
Inpatient Diabetes Program Recommendations ? ?AACE/ADA: New Consensus Statement on Inpatient Glycemic Control (2015) ? ?Target Ranges:  Prepandial:   less than 140 mg/dL ?     Peak postprandial:   less than 180 mg/dL (1-2 hours) ?     Critically ill patients:  140 - 180 mg/dL  ? ?Lab Results  ?Component Value Date  ? GLUCAP 375 (H) 01/09/2022  ? HGBA1C 7.1 (H) 12/24/2021  ? ? ?Review of Glycemic Control ? Latest Reference Range & Units 01/09/22 00:24 01/09/22 07:09 01/09/22 11:36  ?Glucose-Capillary 70 - 99 mg/dL 158 (H) 135 (H) 375 (H)  ?(H): Data is abnormally high ?Diabetes history: DM 2 ?Outpatient Diabetes medications:  ?Lantus 43 units daily ?Amaryl 4 mg daily ?Current orders for Inpatient glycemic control:  ?Novolog 0-15 units tid with meals ?Levemir 40 units QHS ?Prednisone 20 mg with breakfast ? ?Inpatient Diabetes Program Recommendations:   ?  ?If to continue with steroids and if post prandials continue to exceed 200's mg/dL, Consider adding Novolog 4 units tid with meals (hold if patient eats less than 50% or NPO). ? ?Thanks, ?Bronson Curb, MSN, RNC-OB ?Diabetes Coordinator ?[phone] (8a-5p) ? ? ? ?

## 2022-01-09 NOTE — Consult Note (Addendum)
?Cardiology Consultation:  ? ?Patient ID: John [name] ?MRN: [id number]; DOB: January 31, 1941 ? ?Admit date: 01/08/2022 ?Date of Consult: 01/09/2022 ? ?PCP:  [name], DO ?  ?[facility] Providers ?Cardiologist:  Kirk Ruths, MD      ? ? ?Patient Profile:  ? ?John [name] is a 81 y.o. 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 Grandville Silos. ? ?History of Present Illness:  ? ?John [name] was recently admitted to [facility] from 3/5 - 12/29/2021 for an acute CHF exacerbation and NSTEMI. Presented with worsening dyspnea and chest pain with Hs Troponin values found to be > 24,000. Was also in new-onset atrial fibrillation at the time of admission. Repeat echo showed his EF was reduced at 35% with global HK and Grade 2 DD. RV function was normal. Given his renal function with creatinine peaking at 3.79 during admission, a cardiac catheterization was not pursued. He was treated with IV Heparin and started on Plavix with loading dose of 300mg  daily followed by 75mg  daily. Was also started on Eliquis 2.5mg  BID for anticoagulation given his atrial fibrillation with no concurrent use of ASA. Was continued on Crestor 10mg  daily and Coreg 12.5mg  BID with Hydralazine 37.5mg  BID and Isordil 20mg  BID being added to his medication regimen. He was not discharged on a diuretic given his AKI and possible ATN. Was recommended to review possible ischemic testing with a catheterization or cardiac stress MRI as an outpatient pending reassessment of his renal function. Weight at the time of discharge was 199 lbs and creatinine was at 3.18 on 12/29/2021. ? ?He presented back to [facility] ED on 01/08/2022 for evaluation of worsening dyspnea. In talking with the patient  today, he reports using supplemental oxygen since hospital discharge but says his breathing has been stable until yesterday when he developed worsening abdominal distention and dyspnea on exertion.  Says that his weight was at 193 lbs that morning but had increased to 198 lbs in the afternoon hours. He does report orthopnea since hospital discharge and has been sleeping in his recliner. Denies any exertional chest pain resembling his prior angina. Does experience occasional acid reflux which occurs after food consumption. No recent lower extremity edema. ? ?Initial labs show WBC 21.5, Hgb 13.2, platelets 231, Na+ 138, K+ 4.6 and creatinine 1.80 (baseline prior to recent admission around 1.9 - 2.1). BNP 826. Hs Troponin 104 with repeat of 118. CXR showing chronic interstitial markings with superimposed patchy interstitial and airspace opacities which may represent infection or edema with follow-up CXR recommended in 3-4 weeks. Also noted to have trace left and a small right pleural effusion. EKG shows baseline artifact but appears most consistent with sinus tachycardia, HR 100 with frequent PVC's and episodes of ventricular trigeminy.  ? ?He received IV Lasix 40mg  while in the ED and has been started on IV Lasix 40mg  BID with a recorded output of -1.3 L thus far. Weight listed as 196 lbs on admission. Labs this AM show creatinine has trended up to 1.96 and K+ at 5.4. ? ?Past Medical History:  ?Diagnosis Date  ? Anxiety   ? 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 BPH (benign prostatic hyperplasia)   ? CAD (coronary  artery disease)   ? Cholesteatoma of attic   ? Depression   ? Diabetes mellitus ([place])   ? Ear disease   ? GERD (gastroesophageal reflux disease)   ? Hyperlipidemia   ? Hypertension   ? PVD (peripheral vascular disease) ([place])   ? Stroke [facility])   ? ? ?Past Surgical History:  ?Procedure Laterality Date  ? BONE ANCHORED HEARING AID IMPLANT Left 06/25/2013  ? Procedure: BONE ANCHORED HEARING AID (BAHA) IMPLANT;  Surgeon: Fannie Knee,  MD;  Location: [facility];  Service: ENT;  Laterality: Left;  ? CAROTID STENT INSERTION Left   ? COCHLEAR IMPLANT    ? CORONARY STENT PLACEMENT    ? MASS EXCISION Left 06/25/2013  ? Procedure: EXCISION LEFT TEMPORAL MASS;  Surgeon: Fannie Knee, MD;  Location: [place];  Service: ENT;  Laterality: Left;  ? TONSILLECTOMY    ?  ? ?Home Medications:  ?Prior to Admission medications   ?Medication Sig Start Date End Date Taking? Authorizing Provider  ?albuterol (VENTOLIN HFA) 108 (90 Base) MCG/ACT inhaler Inhale 2 puffs into the lungs every 6 (six) hours as needed for wheezing or shortness of breath. 12/20/20  Yes Pokhrel, L[place]man, MD  ?allopurinol (ZYLOPRIM) 300 MG tablet Take 300 mg by mouth daily. 06/21/12  Yes [provider]  ?apixaban (ELIQUIS) 2.5 MG TABS tablet Take 1 tablet (2.5 mg total) by mouth 2 (two) times daily. 12/29/21  Yes Little Ishikawa, MD  ?carvedilol (COREG) 12.5 MG tablet Take 1 tablet (12.5 mg total) by mouth 2 (two) times daily with a meal. 09/18/21  Yes Crenshaw, Denice Bors, MD  ?cholecalciferol (VITAMIN D3) 25 MCG (1000 UNIT) tablet Take 1,000 Units by mouth daily.   Yes [provider]  ?clopidogrel (PLAVIX) 75 MG tablet Take 1 tablet (75 mg total) by mouth daily. 12/29/21  Yes Little Ishikawa, MD  ?Cyanocobalamin (VITAMIN B 12 PO) Take 1 tablet by mouth daily.   Yes [provider]  ?folic acid (FOLVITE) 1 MG tablet Take 1 tablet (1 mg total) by mouth daily. 12/29/21  Yes Little Ishikawa, MD  ?gabapentin (NEURONTIN) 300 MG capsule Take 1 capsule (300 mg total) by mouth at bedtime. 12/29/21  Yes Little Ishikawa, MD  ?hydrALAZINE (APRESOLINE) 25 MG tablet Take 1.5 tablets (37.5 mg total) by mouth 2 (two) times daily. 12/29/21  Yes Little Ishikawa, MD  ?HYDROcodone-acetaminophen (NORCO/VICODIN) 5-325 MG tablet Take 1 tablet by mouth every 4 (four) hours as needed for moderate pain. 12/29/21 12/29/22 Yes Little Ishikawa, MD   ?insulin glargine (LANTUS SOLOSTAR) 100 UNIT/ML Solostar Pen Inject 50 Units into the skin daily. Dose per sliding scale. 12/29/21  Yes Little Ishikawa, MD  ?isosorbide dinitrate (ISORDIL) 20 MG tablet Take 1 tablet (20 mg total) by mouth 2 (two) times daily. 12/29/21  Yes Little Ishikawa, MD  ?pantoprazole (PROTONIX) 40 MG tablet Take 40 mg by mouth daily with breakfast. 08/19/14  Yes [provider]  ?rosuvastatin (CRESTOR) 10 MG tablet Take 1 tablet (10 mg total) by mouth every evening. 12/29/21  Yes Little Ishikawa, MD  ?senna (SENOKOT) 8.6 MG tablet Take 2-3 tablets by mouth daily as needed for constipation.   Yes [provider]  ?sertraline (ZOLOFT) 25 MG tablet Take 1 tablet (25 mg total) by mouth every evening. 01/09/21  Yes Thayer Headings, PMHNP  ?tamsulosin (FLOMAX) 0.4 MG CAPS capsule Take 0.4 mg by mouth daily. 09/22/14  Yes [provider]  ?thiamine 100 MG tablet Take 1 tablet (100 mg total) by mouth daily. 12/30/21  Yes Little Ishikawa, MD  ?Insulin Pen Needle 32G X 4 MM MISC Use to inject insulin up to 4 times daily. 12/29/21   Little Ishikawa, MD  ?Multiple Vitamins-Minerals (CERTAVITE/ANTIOXIDANTS) TABS Take 1 tablet by mouth daily. ?Patient not taking: Reported on 01/09/2022 12/29/21   Little Ishikawa, MD  ?predniSONE (DELTASONE) 10 MG tablet Take 4 tablets (40 mg total) by mouth daily for 3 days, THEN 3 tablets (30 mg total) daily for 3 days, THEN 2 tablets (20 mg total) daily for 3 days, THEN 1 tablet (10 mg total) daily for 3 days. ?Patient not taking: Reported on 01/09/2022 12/29/21 01/10/22  Little Ishikawa, MD  ? ? ?Inpatient Medications: ?Scheduled Meds: ? allopurinol  300 mg Oral Daily  ? alum & mag hydroxide-simeth  30 mL Oral Once  ? And  ? lidocaine  15 mL Oral Once  ? apixaban  2.5 mg Oral BID  ? carvedilol  12.5 mg Oral BID WC  ? clopidogrel  75 mg Oral Daily  ? folic acid  1 mg Oral Daily  ? gabapentin  300 mg Oral QHS  ?  hydrALAZINE  37.5 mg Oral BID  ? insulin aspart  0-15 Units Subcutaneous TID WC  ? insulin detemir  40 Units Subcutaneous QHS  ? isosorbide dinitrate  20 mg Oral BID  ? lidocaine (PF)      ? melatonin  6 mg

## 2022-01-09 NOTE — Assessment & Plan Note (Addendum)
No infectious signs or symptoms ?Likely secondary to prednisone and will continue to monitor. ?-Patient's WBC went from 12.7 and is now 12.3 ?Continue to trend in the outpatient setting ?

## 2022-01-09 NOTE — H&P (Signed)
?History and Physical  ? ? ?Patient: John [name] UVO:[id number] DOB: June 18, 1941 ?DOA: 01/08/2022 ?DOS: the patient was seen and examined on 01/09/2022 ?PCP: [name], DO  ?Patient coming from: Home ? ?Chief Complaint:  ?Chief Complaint  ?Patient presents with  ? Shortness of Breath  ? ?HPI: John [name] is a 81 y.o. male with medical history significant of anxiety, BPH, coronary artery disease, depression, diabetes mellitus type 2, GERD, hyperlipidemia, hypertension, and more presents to ED with a chief complaint of dyspnea.  Patient reports it was gradual onset and started around 3 PM this afternoon.  He had 5 pounds of weight gain during the day.  He he has dyspnea on exertion and orthopnea.  Patient denies cough, chest pain.  He reports he does have palpitations at times but they are vague and the duration is unknown.  Patient denies any peripheral edema, but reports it is normal for him to get swelling in his abdomen which he does feel.  He denies any chest pain.  He reports that since being taken off of Lasix his urine output is decreased.  Patient has no other complaints at this time. ? ?Patient does not smoke, does drink alcohol, reports its 2 glasses of wine, and not every day.  Does not do illicit drugs.  He is vaccinated for COVID.  Patient is full code. ?Review of Systems: As mentioned in the history of present illness. All other systems reviewed and are negative. ?Past Medical History:  ?Diagnosis Date  ? Anxiety   ? 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 BPH (benign prostatic hyperplasia)   ? CAD (coronary artery disease)   ? Cholesteatoma of attic   ? Depression   ? Diabetes mellitus ([place])   ? Ear disease   ? GERD (gastroesophageal reflux disease)   ? Hyperlipidemia   ? Hypertension   ? PVD (peripheral vascular disease) ([place])   ? Stroke [facility])   ? ?Past Surgical History:  ?Procedure Laterality Date  ? BONE ANCHORED HEARING AID IMPLANT Left 06/25/2013  ? Procedure: BONE ANCHORED HEARING AID (BAHA) IMPLANT;  Surgeon:  Fannie Knee, MD;  Location: [place];  Service: ENT;  Laterality: Left;  ? CAROTID STENT INSERTION Left   ? COCHLEAR IMPLANT    ? CORONARY STENT PLACEMENT    ? MASS EXCISION Left 06/25/2013  ? Procedure: EXCISION LEFT TEMPORAL MASS;  Surgeon: Fannie Knee, MD;  Location: [place];  Service: ENT;  Laterality: Left;  ? TONSILLECTOMY    ? ?Social History:  reports that he quit smoking about 32 years ago. His smoking use included cigarettes. He has never used smokeless tobacco. He reports that he does not currently use alcohol. He reports that he does not use drugs. ? ?Allergies  ?Allergen Reactions  ? Brilinta [Ticagrelor] Shortness Of Breath  ? Penicillins Hives  ? Ezetimibe-Simvastatin Other (See Comments)  ?  Myalgia ?  ? ? ?Family History  ?Problem Relation Age of Onset  ? Diabetes Other   ? Diabetes Mother   ? Diabetes Maternal Aunt   ? Depression Other   ? Depression Other   ? Drug abuse Daughter   ? Diabetes Daughter   ? ? ?Prior to Admission medications   ?Medication Sig Start Date End Date Taking? Authorizing Provider  ?albuterol (VENTOLIN HFA) 108 (90 Base) MCG/ACT inhaler Inhale 2 puffs into the lungs every 6 (six) hours as needed for wheezing or shortness of breath. 12/20/20   Pokhrel, Corrie Mckusick, MD  ?allopurinol (ZYLOPRIM)  300 MG tablet Take 300 mg by mouth daily. 06/21/12   [provider]  ?apixaban (ELIQUIS) 2.5 MG TABS tablet Take 1 tablet (2.5 mg total) by mouth 2 (two) times daily. 12/29/21   Little Ishikawa, MD  ?carvedilol (COREG) 12.5 MG tablet Take 1 tablet (12.5 mg total) by mouth 2 (two) times daily with a meal. 09/18/21   Lelon Perla, MD  ?cholecalciferol (VITAMIN D3) 25 MCG (1000 UNIT) tablet Take 1,000 Units by mouth daily.    [provider]  ?clopidogrel (PLAVIX) 75 MG tablet Take 1 tablet (75 mg total) by mouth daily. 12/29/21   Little Ishikawa, MD  ?Cyanocobalamin (VITAMIN B 12 PO) Take 1 tablet by mouth daily.    [provider]  ?folic acid (FOLVITE) 1 MG tablet Take 1 tablet (1 mg total) by mouth daily. 12/29/21   Little Ishikawa, MD  ?gabapentin (NEURONTIN) 300 MG capsule Take 1 capsule (300 mg total) by mouth at bedtime. 12/29/21   Little Ishikawa, MD  ?hydrALAZINE (APRESOLINE) 25 MG tablet Take 1.5 tablets (37.5 mg total) by mouth 2 (two) times daily. 12/29/21   Little Ishikawa, MD  ?HYDROcodone-acetaminophen (NORCO/VICODIN) 5-325 MG tablet Take 1 tablet by mouth every 4 (four) hours as needed for moderate pain. 12/29/21 12/29/22  Little Ishikawa, MD  ?insulin glargine (LANTUS SOLOSTAR) 100 UNIT/ML Solostar Pen Inject 50 Units into the skin daily. Dose per sliding scale. 12/29/21   Little Ishikawa, MD  ?Insulin Pen Needle 32G X 4 MM MISC Use to inject insulin up to 4 times daily. 12/29/21   Little Ishikawa, MD  ?isosorbide dinitrate (ISORDIL) 20 MG tablet Take 1 tablet (20 mg total) by mouth 2 (two) times daily. 12/29/21   Little Ishikawa, MD  ?Multiple Vitamins-Minerals (CERTAVITE/ANTIOXIDANTS) TABS Take 1 tablet by mouth daily. 12/29/21   Little Ishikawa, MD  ?pantoprazole (PROTONIX) 40 MG tablet Take 40 mg by mouth daily with breakfast. 08/19/14   [provider]  ?predniSONE (DELTASONE) 10 MG tablet Take 4 tablets (40 mg total) by mouth daily for 3 days, THEN 3 tablets (30 mg total) daily for 3 days, THEN 2 tablets (20 mg total) daily for 3 days, THEN 1 tablet (10 mg total) daily for 3 days. 12/29/21 01/10/22  Little Ishikawa, MD  ?rosuvastatin (CRESTOR) 10 MG tablet Take 1 tablet (10 mg total) by mouth every evening. 12/29/21   Little Ishikawa, MD  ?senna (SENOKOT) 8.6 MG tablet Take 2-3 tablets by mouth daily as needed for constipation.    [provider]  ?sertraline (ZOLOFT) 25 MG tablet Take 1 tablet (25 mg total) by mouth every evening. 01/09/21   Thayer Headings, PMHNP  ?tamsulosin (FLOMAX) 0.4 MG CAPS capsule Take 0.4 mg by mouth daily. 09/22/14   [provider]  ?thiamine 100 MG tablet Take 1 tablet (100 mg total) by mouth daily. 12/30/21   Little Ishikawa, MD  ? ? ?Physical Exam: ?Vitals:  ? 01/08/22 2357 01/09/22 0025 01/09/22 0044 01/09/22 0146  ?BP: (!) 147/87  (!) 189/81 (!) 156/62  ?Pulse: 86  91 84  ?Resp: (!) 26  (!) 24 (!) 22  ?Temp:   97.7 ?F (36.5 ?C)   ?TempSrc:      ?SpO2: 94%  95% 97%  ?Weight:      ?Height:  5\' 7"  (1.702 m)    ? ?1.  General: ?Patient lying supine in bed,  no acute distress ?  ?  2. Psychiatric: ?Alert and oriented x 3, mood and behavior normal for situation, pleasant and cooperative with exam ?  ?3. Neurologic: ?Speech and language are normal, face is symmetric, moves all 4 extremities voluntarily, at baseline without acute deficits on limited exam ?  ?4. HEENMT:  ?Head is atraumatic, normocephalic, pupils reactive to light, neck is supple, trachea is midline, mucous membranes are moist ?  ?5. Respiratory : ?Lungs are clear to auscultation bilaterally without wheezing, rhonchi, rales, no cyanosis, no increase in work of breathing or accessory muscle use, 3 L nasal cannula in place ?  ?6. Cardiovascular : ?Heart rate normal, rhythm is regular, no murmurs, rubs or gallops, no peripheral edema, peripheral pulses palpated ?  ?7. Gastrointestinal:  ?Abdomen is soft, nondistended, nontender to palpation bowel sounds active, no masses or organomegaly palpated ?  ?8. Skin:  ?Skin is warm, dry and intact without rashes, acute lesions, or ulcers on limited exam ?  ?9.Musculoskeletal:  ?No acute deformities or trauma, no asymmetry in tone, no peripheral edema, peripheral pulses palpated, no tenderness to palpation in the extremities ? ?Data Reviewed: ?In the ED ?Temp 97.7, heart rate 49-102, respiratory rate 19-26, blood pressure 196/83, satting at 99% ?Leukocytosis 21.5, hemoglobin 13.2, platelets 231 ?Creatinine 1.8 but this is down from 3.1 at last admission ?Troponin 104 but this is down from greater than 24,000 at the last  admission -patient was not able to have a cardiac cath at last admission due to renal function ?Negative respiratory panel ?Chest x-ray shows coarsened intra markings with superimposed patchy interstitial airspace

## 2022-01-09 NOTE — TOC Initial Note (Signed)
Transition of Care (TOC) - Initial/Assessment Note  ? ? ?Patient Details  ?Name: John [name] ?MRN: [id number] ?Date of Birth: [birth year] ? ?Transition of Care (TOC) CM/SW Contact:    ?Salome Arnt, LCSW ?Phone Number: ?01/09/2022, 10:59 AM ? ?Clinical Narrative:  Pt admitted due to CHF exacerbation. TOC received consult for CHF screening and substance abuse counseling. Pt reports he lives with his daughter and son-in-law. He is independent with ADLs. Pt ambulates with either a cane or walker. He is on 4L home O2 ([employer]). Pt recently admitted due to CHF/NSTEMI. He reports HHPT was arranged with [facility] after that admission. Pt agreeable to add RN. Cory with [facility] notified. Pt plans to return home when medically stable. CHF screening completed. Pt indicates he was diagnosed with CHF during last admission. He states he has been weighing himself daily and is "trying to" follow heart healthy diet. Pt said he takes medications as prescribed. Living Well with CHF booklet ordered. Substance abuse counseling completed. Pt indicates when he drinks, it's about 2 glasses of wine, but this is not every day. Pt does not feel this is a problem for him and declines resources. TOC will continue to follow and assist with d/c planning.               ? ? ?Expected Discharge Plan: [place] ?Barriers to Discharge: Continued Medical Work up ? ? ?Patient Goals and CMS Choice ?Patient states their goals for this hospitalization and ongoing recovery are:: return home ?  ?Choice offered to / list presented to : Patient ? ?Expected Discharge Plan and Services ?Expected Discharge Plan: [place] ?In-house Referral: Clinical Social Work ?  ?Post Acute Care Choice: Resumption of Svcs/PTA Provider ?Living arrangements for the past 2 months: [place] ?                ?  ?  ?  ?  ?  ?HH Arranged: PT, RN ?[place] Agency: [facility] ?Date HH Agency Contacted: 01/09/22 ?Time [place]: 3903 ?Representative spoke with at [place]: Tommi Rumps ? ?Prior Living Arrangements/Services ?Living arrangements for the past 2 months: [place] ?Lives with:: Adult Children ?Patient language and need for interpreter reviewed:: Yes ?Do you feel safe going back to the place where you live?: Yes      ?Need for Family Participation in Patient Care: No (Comment) ?  ?Current home services: DME (cane, walker, O2) ?Criminal Activity/Legal Involvement Pertinent to Current Situation/Hospitalization: No - Comment as needed ? ?Activities of Daily Living ?Home Assistive Devices/Equipment: Oxygen, Eyeglasses, Dentures (specify type), Cane (specify quad or straight), Walker (specify type), Scales, CBG Meter ?ADL Screening (condition at time of admission) ?Patient's cognitive ability adequate to safely complete daily activities?: Yes ?Is the patient deaf or have difficulty hearing?: No ?Does the patient have difficulty seeing, even when wearing glasses/contacts?: Yes ?Does the patient have difficulty concentrating, remembering, or making decisions?: No ?Patient able to express need for assistance with ADLs?: Yes ?Does the patient have difficulty dressing or bathing?: Yes (per patient, difficulty only due to shortness of breath) ?Independently performs ADLs?: Yes (appropriate for developmental age) ?Does the patient have difficulty walking or climbing stairs?: Yes ?Weakness of Legs: Both ?Weakness of Arms/Hands: None ? ?Permission Sought/Granted ?  ?  ?   ?   ?   ?   ? ?Emotional Assessment ?  ?Attitude/Demeanor/Rapport: Engaged ?Affect (typically observed): Pleasant ?Orientation: : Oriented to Self, Oriented  to Place, Oriented to  Time, Oriented to Situation ?  ?Psych Involvement: No (comment) ? ?Admission diagnosis:  Acute respiratory failure with hypoxia ([place]) [J96.01] ?Acute on chronic respiratory failure with hypoxia ([facility]) [J96.21] ?Acute on chronic congestive heart failure, unspecified heart failure type  ([place]) [I50.9] ?Patient Active Problem List  ? Diagnosis Date Noted  ? Diabetes mellitus, type 2 ([place]) 01/09/2022  ? Acute on chronic respiratory failure with hypoxia ([place]) 01/08/2022  ? Hypoxia 12/24/2021  ? D-dimer, elevated 12/24/2021  ?  Class: Acute  ? NSTEMI (non-ST elevated myocardial infarction) ([place]) 12/24/2021  ? Acute exacerbation of CHF (congestive heart failure) ([place]) 09/13/2021  ? Acute respiratory failure with hypoxia and hypercapnia ([place]) 09/13/2021  ? Leukocytosis 09/13/2021  ? Elevated troponin 09/13/2021  ? Obstructive sleep apnea 09/13/2021  ? BPH (benign prostatic hyperplasia) 09/13/2021  ? CKD (chronic kidney disease), stage III ([place]) 09/13/2021  ? Diabetic neuropathy ([place]) 09/13/2021  ? Chest pain 12/20/2020  ? Acute diastolic CHF (congestive heart failure) ([place]) 12/19/2020  ? Essential hypertension 10/03/2012  ? CAD (coronary artery disease) of artery bypass graft 06/26/2012  ? Cardiomyopathy, 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  ? Essential hypertension, malignant 06/26/2012  ? Mixed hyperlipidemia 06/26/2012  ? Peripheral vascular disease ([place]) 06/26/2012  ? Coronary atherosclerosis of native coronary artery 06/26/2012  ? ?PCP:  [name], DO ?Pharmacy:   ?[facility] ?[address] ?[place] [address] ?Phone: [phone] Fax: [phone] ? ?[id number] - [place], [place] - [address] ?[address] [place] [address] ?Phone: [phone] Fax: [phone] ? ?[place] ?[address] ?[place] [address] ?Phone: [phone] Fax: [phone] ? ? ? ? ?Social Determinants of Health (SDOH) Interventions ?  ? ?Readmission Risk Interventions ?Readmission Risk Prevention Plan 12/29/2021  ?Transportation Screening Complete  ?PCP or Specialist Appt within 3-5 Days Complete  ?[facility] or Home Care Consult Complete  ?Social Work Consult for [place]  Planning/Counseling Complete  ?Palliative Care Screening Not Applicable  ?Medication Review Press photographer) Complete  ?Some recent data might be hidden  ? ? ? ?

## 2022-01-10 DIAGNOSIS — D72829 Elevated white blood cell count, unspecified: Secondary | ICD-10-CM | POA: Diagnosis not present

## 2022-01-10 DIAGNOSIS — I10 Essential (primary) hypertension: Secondary | ICD-10-CM | POA: Diagnosis not present

## 2022-01-10 DIAGNOSIS — J9621 Acute and chronic respiratory failure with hypoxia: Secondary | ICD-10-CM | POA: Diagnosis not present

## 2022-01-10 DIAGNOSIS — I5020 Unspecified systolic (congestive) heart failure: Secondary | ICD-10-CM

## 2022-01-10 DIAGNOSIS — I5033 Acute on chronic diastolic (congestive) heart failure: Secondary | ICD-10-CM | POA: Diagnosis not present

## 2022-01-10 LAB — CBC
HCT: 38.2 % — ABNORMAL LOW (ref 39.0–52.0)
Hemoglobin: 11.8 g/dL — ABNORMAL LOW (ref 13.0–17.0)
MCH: 29.4 pg (ref 26.0–34.0)
MCHC: 30.9 g/dL (ref 30.0–36.0)
MCV: 95.3 fL (ref 80.0–100.0)
Platelets: 180 10*3/uL (ref 150–400)
RBC: 4.01 MIL/uL — ABNORMAL LOW (ref 4.22–5.81)
RDW: 17.2 % — ABNORMAL HIGH (ref 11.5–15.5)
WBC: 12.3 10*3/uL — ABNORMAL HIGH (ref 4.0–10.5)
nRBC: 0 % (ref 0.0–0.2)

## 2022-01-10 LAB — RENAL FUNCTION PANEL
Albumin: 3.3 g/dL — ABNORMAL LOW (ref 3.5–5.0)
Anion gap: 8 (ref 5–15)
BUN: 62 mg/dL — ABNORMAL HIGH (ref 8–23)
CO2: 32 mmol/L (ref 22–32)
Calcium: 8.8 mg/dL — ABNORMAL LOW (ref 8.9–10.3)
Chloride: 100 mmol/L (ref 98–111)
Creatinine, Ser: 2.28 mg/dL — ABNORMAL HIGH (ref 0.61–1.24)
GFR, Estimated: 28 mL/min — ABNORMAL LOW (ref >60)
Glucose, Bld: 47 mg/dL — ABNORMAL LOW (ref 70–99)
Phosphorus: 2.9 mg/dL (ref 2.5–4.6)
Potassium: 4.8 mmol/L (ref 3.5–5.1)
Sodium: 140 mmol/L (ref 135–145)

## 2022-01-10 LAB — GLUCOSE, CAPILLARY
Glucose-Capillary: 49 mg/dL — ABNORMAL LOW (ref 70–99)
Glucose-Capillary: 78 mg/dL (ref 70–99)
Glucose-Capillary: 88 mg/dL (ref 70–99)
Glucose-Capillary: 131 mg/dL — ABNORMAL HIGH (ref 70–99)

## 2022-01-10 LAB — MAGNESIUM: Magnesium: 2.4 mg/dL (ref 1.7–2.4)

## 2022-01-10 MED ORDER — INSULIN ASPART 100 UNIT/ML IJ SOLN: 0.0000 [IU] | Freq: Three times a day (TID) | INTRAMUSCULAR | Status: DC

## 2022-01-10 MED ORDER — TORSEMIDE 20 MG PO TABS
20.0000 mg | ORAL_TABLET | Freq: Every day | ORAL | Status: DC
Start: 2022-01-10 — End: 2022-01-10
  Administered 2022-01-10: 20 mg via ORAL
  Filled 2022-01-10: qty 1

## 2022-01-10 MED ORDER — INSULIN DETEMIR 100 UNIT/ML ~~LOC~~ SOLN
30.0000 [IU] | Freq: Every day | SUBCUTANEOUS | Status: DC
  Filled 2022-01-10: qty 0.3

## 2022-01-10 MED ORDER — TORSEMIDE 20 MG PO TABS: 20.0000 mg | ORAL_TABLET | Freq: Every day | ORAL | 0 refills | Status: DC

## 2022-01-10 NOTE — Progress Notes (Signed)
? ?Progress Note ? ?Patient Name: John [name] ?Date of Encounter: 01/10/2022 ? ?Primary Cardiologist: Kirk Ruths, MD ? ?Subjective  ? ?No chest pain or shortness of breath at rest.  Abdomen less tight.  No leg swelling. ? ?Inpatient Medications  ?  ?Scheduled Meds: ? allopurinol  300 mg Oral Daily  ? apixaban  2.5 mg Oral BID  ? carvedilol  12.5 mg Oral BID WC  ? Chlorhexidine Gluconate Cloth  6 each Topical Daily  ? clopidogrel  75 mg Oral Daily  ? folic acid  1 mg Oral Daily  ? gabapentin  300 mg Oral QHS  ? hydrALAZINE  37.5 mg Oral BID  ? insulin aspart  0-15 Units Subcutaneous TID WC  ? insulin detemir  40 Units Subcutaneous QHS  ? isosorbide dinitrate  20 mg Oral BID  ? melatonin  6 mg Oral QHS  ? multivitamin with minerals  1 tablet Oral Daily  ? pantoprazole  40 mg Oral Q breakfast  ? rosuvastatin  10 mg Oral QPM  ? sertraline  25 mg Oral QPM  ? tamsulosin  0.4 mg Oral Daily  ? thiamine  100 mg Oral Daily  ? Or  ? thiamine  100 mg Intravenous Daily  ? ? ?PRN Meds: ?acetaminophen **OR** acetaminophen, albuterol, alum & mag hydroxide-simeth **AND** lidocaine, LORazepam **OR** LORazepam, ondansetron **OR** ondansetron (ZOFRAN) IV, oxyCODONE, polyethylene glycol, senna  ? ?Vital Signs  ?  ?Vitals:  ? 01/09/22 1227 01/09/22 2043 01/10/22 0510 01/10/22 0820  ?BP: (!) 154/66 (!) 153/62 (!) 142/51 (!) 146/70  ?Pulse: 76 76 69   ?Resp: 16 15 15    ?Temp: 98.4 ?F (36.9 ?C) 98.4 ?F (36.9 ?C) 97.8 ?F (36.6 ?C)   ?TempSrc: Oral     ?SpO2: 96% 94% 98%   ?Weight:      ?Height:      ? ? ?Intake/Output Summary (Last 24 hours) at 01/10/2022 0925 ?Last data filed at 01/10/2022 0516 ?Gross per 24 hour  ?Intake 600 ml  ?Output 2800 ml  ?Net -2200 ml  ? ?Filed Weights  ? 01/08/22 2124  ?Weight: 89.3 kg  ? ? ?Telemetry  ?  ?Sinus rhythm with PVCs.  Personally reviewed. ? ?ECG  ?  ?No ECG reviewed. ? ?Physical Exam  ? ?GEN: No acute distress.   ?Neck: No JVD. ?Cardiac: RRR with ectopy, 1/6 systolic murmur.  ?Respiratory:  Nonlabored.  Decreased breath sounds at the bases. ?GI: Soft, nontender, bowel sounds present. ?MS: No edema. ? ?Labs  ?  ?Chemistry ?Recent Labs  ?Lab 01/08/22 ?2145 01/09/22 ?0453 01/10/22 ?0430  ?NA 138 140 140  ?K 4.6 5.4* 4.8  ?CL 103 103 100  ?CO2 26 28 32  ?GLUCOSE 166* 134* 47*  ?BUN 48* 52* 62*  ?CREATININE 1.80* 1.96* 2.28*  ?CALCIUM 8.7* 8.8* 8.8*  ?PROT  --  6.3*  --   ?ALBUMIN  --  3.4* 3.3*  ?AST  --  19  --   ?ALT  --  27  --   ?ALKPHOS  --  47  --   ?BILITOT  --  0.7  --   ?GFRNONAA 37* 34* 28*  ?ANIONGAP 9 9 8   ?  ? ?Hematology ?Recent Labs  ?Lab 01/08/22 ?2145 01/09/22 ?0453 01/10/22 ?0430  ?WBC 21.5* 12.7* 12.3*  ?RBC 4.48 4.28 4.01*  ?HGB 13.2 12.1* 11.8*  ?HCT 43.0 41.0 38.2*  ?MCV 96.0 95.8 95.3  ?MCH 29.5 28.3 29.4  ?MCHC 30.7 29.5* 30.9  ?RDW 17.1* 17.2* 17.2*  ?  PLT 231 185 180  ? ? ?Cardiac Enzymes ?Recent Labs  ?Lab 12/24/21 ?0419 12/24/21 ?9470 12/24/21 ?1433 01/08/22 ?2145 01/08/22 ?2352  ?TROPONINIHS 39* 313* >24,000* 104* 118*  ? ? ?BNP ?Recent Labs  ?Lab 01/08/22 ?2145  ?BNP 826.0*  ?  ? ?Radiology  ?  ?DG Chest Port 1 View ? ?Result Date: 01/08/2022 ?CLINICAL DATA:  sob EXAM: PORTABLE CHEST 1 VIEW COMPARISON:  Chest x-ray 12/24/2021. CT chest 12/24/2021 FINDINGS: The heart and mediastinal contours are unchanged. Aortic calcification. Coronary artery stent. No focal consolidation. Chronic coarsened intra markings with superimposed a patchy interstitial and airspace opacities. Pulmonary edema. Trace left and trace to small volume right pleural effusions. No pneumothorax. No acute osseous abnormality. IMPRESSION: 1. Chronic coarsened intra markings with superimposed a patchy interstitial and airspace opacities. Finding could represent infection in relation versus pulmonary edema. Followup PA and lateral chest X-ray is recommended in 3-4 weeks following therapy to ensure resolution. 2. Trace left and trace to small volume right pleural effusions. 3.  Aortic Atherosclerosis (ICD10-I70.0).  Electronically Signed   By: Iven Finn M.D.   On: 01/08/2022 22:43   ? ?Cardiac Studies  ? ?Echocardiogram 12/25/2021: ? 1. Left ventricular ejection fraction, by estimation, is 35%. The left  ?ventricle has moderately decreased function. The left ventricle  ?demonstrates global hypokinesis. The left ventricular internal cavity size  ?was mildly dilated. There is mild left  ?ventricular hypertrophy. Left ventricular diastolic parameters are  ?consistent with Grade II diastolic dysfunction (pseudonormalization).  ? 2. Right ventricular systolic function is normal. The right ventricular  ?size is normal. Tricuspid regurgitation signal is inadequate for assessing  ?PA pressure.  ? 3. The mitral valve is normal in structure. Trivial mitral valve  ?regurgitation. No evidence of mitral stenosis.  ? 4. The aortic valve is tricuspid. There is mild calcification of the  ?aortic valve. Aortic valve regurgitation is not visualized. No aortic  ?stenosis is present.  ? ?Assessment & Plan  ?  ?1.  HFrEF with ischemic cardiomyopathy and acute exacerbation in the absence of diuretics as an outpatient and with known renal insufficiency.  Clinically improved with IV Lasix.  Net urine output of approximately 3500 cc during stay. ? ?2.  Known CAD status post prior multiple PCI/stent intervention and recent medically managed NSTEMI in the setting of acute on chronic renal failure.  No active chest pain and appropriate high-sensitivity troponin I trend noted.  He continues on Plavix and Crestor. ? ?3.  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  Plan to transition to oral Demadex for discharge. ? ?4.  Paroxysmal atrial fibrillation with CHA2DS2-VASc score of 7. ? ?Discussed with patient and hospitalist.  Plan to start Demadex 20 mg daily.  Otherwise continue Plavix, Eliquis, Coreg, hydralazine, Isordil, and Crestor.  Would have him ambulate with plan for potential discharge home today and close  outpatient follow-up. ? ?Signed, ?Rozann Lesches, MD  ?01/10/2022, 9:25 AM    ?

## 2022-01-10 NOTE — Progress Notes (Signed)
Inpatient Diabetes Program Recommendations ? ?AACE/ADA: New Consensus Statement on Inpatient Glycemic Control (2015) ? ?Target Ranges:  Prepandial:   less than 140 mg/dL ?     Peak postprandial:   less than 180 mg/dL (1-2 hours) ?     Critically ill patients:  140 - 180 mg/dL  ? ?Lab Results  ?Component Value Date  ? GLUCAP 88 01/10/2022  ? HGBA1C 7.1 (H) 12/24/2021  ? ? ?Review of Glycemic Control ? Latest Reference Range & Units 01/09/22 16:20 01/09/22 20:45 01/10/22 04:40 01/10/22 05:01 01/10/22 08:00  ?Glucose-Capillary 70 - 99 mg/dL 177 (H) 266 (H) 49 (L) 78 88  ?(H): Data is abnormally high ?(L): Data is abnormally low ? ?Diabetes history: DM 2 ?Outpatient Diabetes medications:  ?Lantus 43 units daily ?Amaryl 4 mg daily ?Current orders for Inpatient glycemic control:  ?Novolog 0-15 units tid with meals ?Levemir 40 units QHS ? ?Inpatient Diabetes Program Recommendations:   ?Patient had hypoglycemia this am 4:40 am of 49. ?Please consider: ?-Decrease Levemir to 30 units daily ?-Decrease Novolog correction to 0-9 units tid ?Secure chat to Dr. Alfredia Ferguson. ? ?Thank you, ?Nani Gasser Rashada Klontz, RN, MSN, CDE  ?Diabetes Coordinator ?Inpatient Glycemic Control Team ?Team Pager [phone] (8am-5pm) ?01/10/2022 9:50 AM ? ? ? ? ?

## 2022-01-10 NOTE — Evaluation (Signed)
Physical Therapy Evaluation ?Patient Details ?Name: John [name] ?MRN: [id number] ?DOB: 1941-07-09 ?Today's Date: 01/10/2022 ? ?History of Present Illness ? Standley Bargo is a 81 y.o. male with medical history significant of anxiety, BPH, coronary artery disease, depression, diabetes mellitus type 2, GERD, hyperlipidemia, hypertension, and more presents to ED with a chief complaint of dyspnea.  Patient reports it was gradual onset and started around 3 PM this afternoon.  He had 5 pounds of weight gain during the day.  He he has dyspnea on exertion and orthopnea.  Patient denies cough, chest pain.  He reports he does have palpitations at times but they are vague and the duration is unknown.  Patient denies any peripheral edema, but reports it is normal for him to get swelling in his abdomen which he does feel.  He denies any chest pain.  He reports that since being taken off of Lasix his urine output is decreased.  Patient has no other complaints at this time. ?  ?Clinical Impression ? Patient functioning at baseline for functional mobility and gait demonstrating good return for ambulation in room and hallway without loss of balance.  Plan:  Patient discharged from physical therapy to care of nursing for ambulation daily as tolerated for length of stay.  ?   ?   ? ?Recommendations for follow up therapy are one component of a multi-disciplinary discharge planning process, led by the attending physician.  Recommendations may be updated based on patient status, additional functional criteria and insurance authorization. ? ?Follow Up Recommendations No PT follow up ? ?  ?Assistance Recommended at Discharge PRN  ?Patient can return home with the following ? Other (comment) (patient at baseline) ? ?  ?Equipment Recommendations None recommended by PT  ?Recommendations for Other Services ?    ?  ?Functional Status Assessment    ? ?  ?Precautions / Restrictions Precautions ?Precautions: Fall ?Precaution Comments: monitor O2  (does not wear at baseline) ?Restrictions ?Weight Bearing Restrictions: No  ? ?  ? ?Mobility ? Bed Mobility ?Overal bed mobility: Modified Independent ?  ?  ?  ?  ?  ?  ?  ?  ? ?Transfers ?Overall transfer level: Modified independent ?  ?  ?  ?  ?  ?  ?  ?  ?  ?  ? ?Ambulation/Gait ?Ambulation/Gait assistance: Modified independent (Device/Increase time) ?Gait Distance (Feet): 150 Feet ?Assistive device: Rolling walker (2 wheels) ?Gait Pattern/deviations: Decreased step length - right, Decreased step length - left, Decreased stride length ?Gait velocity: decreased ?  ?  ?General Gait Details: good return for ambulation in room and hallway without loss of balance, on 3 LPM with SpO2 at 98% ? ?Stairs ?  ?  ?  ?  ?  ? ?Wheelchair Mobility ?  ? ?Modified Rankin (Stroke Patients Only) ?  ? ?  ? ?Balance Overall balance assessment: Needs assistance ?Sitting-balance support: Feet supported, No upper extremity supported ?Sitting balance-Leahy Scale: Good ?Sitting balance - Comments: seated EOB ?  ?Standing balance support: During functional activity, Bilateral upper extremity supported ?Standing balance-Leahy Scale: Fair ?Standing balance comment: fair/good using RW ?  ?  ?  ?  ?  ?  ?  ?  ?  ?  ?  ?   ? ? ? ?Pertinent Vitals/Pain Pain Assessment ?Pain Assessment: No/denies pain  ? ? ?Home Living Family/patient expects to be discharged to:: Private residence ?Living Arrangements: Children ?Available Help at Discharge: Available PRN/intermittently ?Type of Home: House ?Home Access: Stairs to enter ?  Entrance Stairs-Rails: Left ?Entrance Stairs-Number of Steps: 3 ?  ?Home Layout: One level ?Home Equipment: Cane - single point;Rollator (4 wheels);Shower seat;Wheelchair - manual ?   ?  ?Prior Function Prior Level of Function : Needs assist ?  ?  ?  ?Physical Assist : ADLs (physical);Mobility (physical) ?Mobility (physical): Bed mobility;Transfers;Gait;Stairs ?  ?Mobility Comments: Household and short distanced ambulator using RW  or SPC ?ADLs Comments: Modified independent for ADL's ; family assisting with IADL's. ?  ? ? ?Hand Dominance  ? Dominant Hand: Right ? ?  ?Extremity/Trunk Assessment  ? Upper Extremity Assessment ?Upper Extremity Assessment: Defer to OT evaluation ?  ? ?Lower Extremity Assessment ?Lower Extremity Assessment: Overall WFL for tasks assessed ?  ? ?Cervical / Trunk Assessment ?Cervical / Trunk Assessment: Normal  ?Communication  ? Communication: No difficulties  ?Cognition Arousal/Alertness: Awake/alert ?Behavior During Therapy: [facility] for tasks assessed/performed ?Overall Cognitive Status: Within Functional Limits for tasks assessed ?  ?  ?  ?  ?  ?  ?  ?  ?  ?  ?  ?  ?  ?  ?  ?  ?  ?  ?  ? ?  ?General Comments   ? ?  ?Exercises    ? ?Assessment/Plan  ?  ?PT Assessment Patient does not need any further PT services  ?PT Problem List   ? ?   ?  ?PT Treatment Interventions     ? ?PT Goals (Current goals can be found in the Care Plan section)  ?Acute Rehab PT Goals ?Patient Stated Goal: return home ?PT Goal Formulation: With patient ?Potential to Achieve Goals: Good ? ?  ?Frequency   ?  ? ? ?Co-evaluation   ?  ?  ?  ?  ? ? ?  ?AM-PAC PT "6 Clicks" Mobility  ?Outcome Measure Help needed turning from your back to your side while in a flat bed without using bedrails?: None ?Help needed moving from lying on your back to sitting on the side of a flat bed without using bedrails?: None ?Help needed moving to and from a bed to a chair (including a wheelchair)?: None ?Help needed standing up from a chair using your arms (e.g., wheelchair or bedside chair)?: None ?Help needed to walk in hospital room?: None ?Help needed climbing 3-5 steps with a railing? : A Little ?6 Click Score: 23 ? ?  ?End of Session Equipment Utilized During Treatment: Oxygen ?Activity Tolerance: Patient tolerated treatment well;Patient limited by fatigue ?Patient left: in chair;with call bell/phone within reach ?Nurse Communication: Mobility status ?PT Visit  Diagnosis: Other abnormalities of gait and mobility (R26.89);Muscle weakness (generalized) (M62.81);Unsteadiness on feet (R26.81) ?  ? ?Time: 4967-5916 ?PT Time Calculation (min) (ACUTE ONLY): 20 min ? ? ?Charges:   PT Evaluation ?$PT Eval Moderate Complexity: 1 Mod ?PT Treatments ?$Therapeutic Activity: 8-22 mins ?  ?   ? ? ?2:43 PM, 01/10/22 ?Lonell Grandchild, MPT ?Physical Therapist with [place] ?[facility] ?[phone] office ?[phone] mobile phone ? ? ?

## 2022-01-10 NOTE — TOC Transition Note (Signed)
Transition of Care (TOC) - CM/SW Discharge Note ? ? ?Patient Details  ?Name: John [name] ?MRN: [id number] ?Date of Birth: 1941-01-28 ? ?Transition of Care (TOC) CM/SW Contact:  ?Salome Arnt, LCSW ?Phone Number: ?01/10/2022, 1:07 PM ? ? ?Clinical Narrative:  Pt d/c today. LCSW notified Tommi Rumps with [facility] of d/c. Home health orders in. Pt states he has a ride this afternoon. No other needs reported.   ? ? ? ?Final next level of care: [place] ?Barriers to Discharge: Barriers Resolved ? ? ?Patient Goals and CMS Choice ?Patient states their goals for this hospitalization and ongoing recovery are:: return home ?  ?Choice offered to / list presented to : Patient ? ?Discharge Placement ?  ?           ?  ?  ?Name of family member notified: pt only ?Patient and family notified of of transfer: 01/10/22 ? ?Discharge Plan and Services ?In-house Referral: Clinical Social Work ?  ?Post Acute Care Choice: Resumption of Svcs/PTA Provider          ?  ?  ?  ?  ?  ?HH Arranged: PT, RN, Nurse's Aide, Social Work ?[place] Agency: [place] ?Date HH Agency Contacted: 01/10/22 ?Time [place]: [phone] ?Representative spoke with at [place]: Tommi Rumps ? ?Social Determinants of Health (SDOH) Interventions ?  ? ? ?Readmission Risk Interventions ? ?  12/29/2021  ? 11:45 AM  ?Readmission Risk Prevention Plan  ?Transportation Screening Complete  ?PCP or Specialist Appt within 3-5 Days Complete  ?[place] or Home Care Consult Complete  ?Social Work Consult for [place] Planning/Counseling Complete  ?Palliative Care Screening Not Applicable  ?Medication Review Press photographer) Complete  ? ? ? ? ? ?

## 2022-01-10 NOTE — Progress Notes (Signed)
Patients foley removed without difficulty  ?

## 2022-01-10 NOTE — Progress Notes (Addendum)
0435 Patient called out and stated he felt that his blood sugar was low. Blood sugar was 49. One soda and two packs of crackers given. Rechecked blood sugar and he was up to 78. Patient stated he still felt low. He also stated he was use to waking up and his blood sugar 130s- 160s. Another canned soda was given to help make patient feel better about his blood sugar levels.  ?MD notifed ?

## 2022-01-10 NOTE — Care Management Important Message (Signed)
Important Message ? ?Patient Details  ?Name: John [name] ?MRN: [id number] ?Date of Birth: 1941-05-18 ? ? ?Medicare Important Message Given:  N/A - LOS <3 / Initial given by admissions ? ? ? ? ?Tommy Medal ?01/10/2022, 1:24 PM ?

## 2022-01-10 NOTE — Assessment & Plan Note (Signed)
-  He has stage IIIb normally and now has an AKI ?-Patient's BUN/creatinine slightly worsened and went from 52/1.96 is now 62/2.28. ?-Patient will be placed on torsemide at discharge ?-Avoid further nephrotoxic medications, contrast dyes, hypotension and renally dose medications ?-Repeat CMP within 1 week ? ?

## 2022-01-10 NOTE — Progress Notes (Signed)
SATURATION QUALIFICATIONS: (This note is used to comply with regulatory documentation for home oxygen) ? ?Patient Saturations on Room Air at Rest = 97% ? ?Patient Saturations on Room Air while Ambulating = 89% ? ?Patient Saturations on 3 Liters of oxygen while Ambulating = 94% ? ?Please briefly explain why patient needs home oxygen:  ?

## 2022-01-10 NOTE — Discharge Summary (Addendum)
?Physician Discharge Summary ?  ?Patient: John [name] MRN: [id number] DOB: Oct 19, 1941  ?Admit date:     01/08/2022  ?Discharge date: 01/10/22  ?Discharge Physician: Raiford Noble, DO  ? ?PCP: [name], DO  ? ?Recommendations at discharge:  ? ?Follow-up with PCP within 1 to 2 weeks and repeat CBC, CMP, mag, Phos ?Follow-up with Cardiology Dr. [name] within 1 week and continue torsemide and weigh yourself daily and if has greater than 5 pound weight gain call the cardiology office for further recommendations ? ?Discharge Diagnoses: ?Principal Problem: ?  Acute on chronic respiratory failure with hypoxia (HCC) ?Active Problems: ?  Acute on chronic combined systolic and diastolic CHF (congestive heart failure) ([place]) ?  Essential hypertension, malignant ?  CAD (coronary artery disease) of artery bypass graft ?  Mixed hyperlipidemia ?  Leukocytosis ?  Diabetes mellitus, type 2 ([place]) ? ?Resolved Problems: ?  * No resolved hospital problems. * ? ?Hospital Course: ?HPI per Dr. Carlynn Purl on 01/09/22 ? John [name] is a 81 y.o. male with medical history significant of anxiety, BPH, coronary artery disease, depression, diabetes mellitus type 2, GERD, hyperlipidemia, hypertension, and more presents to ED with a chief complaint of dyspnea.  Patient reports it was gradual onset and started around 3 PM this afternoon.  He had 5 pounds of weight gain during the day.  He he has dyspnea on exertion and orthopnea.  Patient denies cough, chest pain.  He reports he does have palpitations at times but they are vague and the duration is unknown.  Patient denies any peripheral edema, but reports it is normal for him to get swelling in his abdomen which he does feel.  He denies any chest pain.  He reports that since being taken off of Lasix his urine output is decreased.  Patient has no other complaints at this time. ?  ?Patient does not smoke, does drink alcohol, reports its 2 glasses of wine, and not every day.  Does not do  illicit drugs.  He is vaccinated for COVID.  Patient is full code. ? ?**Interim History ?Patient was given diuresis and improved and his diuresis was held given that his renal function went up slowly.  Cardiology recommended transitioning to oral torsemide and discharging home.  PT OT recommended no follow-up.  He was a little hypoglycemic this morning so his insulin regimen was adjusted.  He is deemed medically stable to be discharged home at this time and continue to follow-up with his PCP and cardiologist in outpatient setting. ? ?Assessment and Plan: ?* Acute on chronic respiratory failure with hypoxia (HCC) ?Secondary to CHF exacerbation ?Patient wears 2 L nasal cannula at home since last hospitalization ?Was initially requiring 6 L nasal cannula in the ER to maintain oxygen sats ?-SpO2: 98 % ?O2 Flow Rate (L/min): 6 L/min; was weaned down to 3 L and ambulated and did well ?Continue oxygen supplementation as needed ?Wean off as tolerated and continue home O2 regimen ?-Continue with torsemide started by cardiology ?Continue to monitor carefully and repeat chest x-ray in 3 to 6 weeks ? ?Acute on chronic combined systolic and diastolic CHF (congestive heart failure) ([place]) ?Last echo earlier this month showed an ejection fraction of 35% ?Strict I's and O's patient is -3.14 L since admission ?Daily weights: Weight was not done ?Fluid restriction ?Patient was taking 40 mg of Lasix daily at home, but that medication was discontinued at the last admission ?Continue 40 mg of IV Lasix twice daily this was held given his  renal function slightly going up ?Chest x-ray shows infection versus pulmonary edema ?Patient reports dyspnea on exertion and orthopnea ?Continue to monitor ?-Discharged home on p.o. torsemide as he is improved ? ?Essential hypertension, malignant ?Continue hydralazine and Coreg ?-Has had torsemide added ?Continue monitor blood pressures per protocol and with PCP ? ? ?Diabetes mellitus, type 2 ([place]) ?50  units basal insulin at home and was receiving 40 units here but will cut down to 30.  Patient does not take 50 units of time. ?Reduced dose while in hospital ?Monitor CBG carefully as he was hypoglycemic this morning.  Repeat CBG is improved and ranged from 78-131 ?-Resume home meds in the outpatient setting and continue with freestyle libre monitoring ? ?CKD (chronic kidney disease), stage III ([place]) ?-He has stage IIIb normally and now has an AKI ?-Patient's BUN/creatinine slightly worsened and went from 52/1.96 is now 62/2.28. ?-Patient will be placed on torsemide at discharge ?-Avoid further nephrotoxic medications, contrast dyes, hypotension and renally dose medications ?-Repeat CMP within 1 week ? ? ?Leukocytosis ?No infectious signs or symptoms ?Likely secondary to prednisone and will continue to monitor. ?-Patient's WBC went from 12.7 and is now 12.3 ?Continue to trend in the outpatient setting ? ?Mixed hyperlipidemia ?Continue Crestor ? ?CAD (coronary artery disease) of artery bypass graft ?Continue Isodril, Crestor, Coreg ?Continue to monitor ? ? ?Consultants: Cardiology ?Procedures performed: None ?Disposition: Home health ?Diet recommendation:  ?Discharge Diet Orders (From admission, onward)  ? ?  Start     Ordered  ? 01/10/22 0000  Diet - low sodium heart healthy       ? 01/10/22 1202  ? 01/10/22 0000  Diet Carb Modified       ? 01/10/22 1202  ? ?  ?  ? ?  ? ?Cardiac and Carb modified diet ?DISCHARGE MEDICATION: ?Allergies as of 01/10/2022   ? ?   Reactions  ? Brilinta [ticagrelor] Shortness Of Breath  ? Penicillins Hives  ? Ezetimibe-simvastatin Other (See Comments)  ? Myalgia  ? ?  ? ?  ?Medication List  ?  ? ?TAKE these medications   ? ?albuterol 108 (90 Base) MCG/ACT inhaler ?Commonly known as: VENTOLIN HFA ?Inhale 2 puffs into the lungs every 6 (six) hours as needed for wheezing or shortness of breath. ?  ?allopurinol 300 MG tablet ?Commonly known as: ZYLOPRIM ?Take 300 mg by mouth daily. ?  ?BD Pen  Needle Nano U/F 32G X 4 MM Misc ?Generic drug: Insulin Pen Needle ?Use to inject insulin up to 4 times daily. ?  ?carvedilol 12.5 MG tablet ?Commonly known as: COREG ?Take 1 tablet (12.5 mg total) by mouth 2 (two) times daily with a meal. ?  ?CertaVite/Antioxidants Tabs ?Take 1 tablet by mouth daily. ?  ?cholecalciferol 25 MCG (1000 UNIT) tablet ?Commonly known as: VITAMIN D3 ?Take 1,000 Units by mouth daily. ?  ?clopidogrel 75 MG tablet ?Commonly known as: PLAVIX ?Take 1 tablet (75 mg total) by mouth daily. ?  ?Eliquis 2.5 MG Tabs tablet ?Generic drug: apixaban ?Take 1 tablet (2.5 mg total) by mouth 2 (two) times daily. ?  ?folic acid 1 MG tablet ?Commonly known as: FOLVITE ?Take 1 tablet (1 mg total) by mouth daily. ?  ?gabapentin 300 MG capsule ?Commonly known as: NEURONTIN ?Take 1 capsule (300 mg total) by mouth at bedtime. ?  ?hydrALAZINE 25 MG tablet ?Commonly known as: APRESOLINE ?Take 1.5 tablets (37.5 mg total) by mouth 2 (two) times daily. ?  ?HYDROcodone-acetaminophen 5-325 MG tablet ?Commonly known  as: NORCO/VICODIN ?Take 1 tablet by mouth every 4 (four) hours as needed for moderate pain. ?  ?isosorbide dinitrate 20 MG tablet ?Commonly known as: ISORDIL ?Take 1 tablet (20 mg total) by mouth 2 (two) times daily. ?  ?Lantus SoloStar 100 UNIT/ML Solostar Pen ?Generic drug: insulin glargine ?Inject 50 Units into the skin daily. Dose per sliding scale. ?  ?pantoprazole 40 MG tablet ?Commonly known as: PROTONIX ?Take 40 mg by mouth daily with breakfast. ?  ?predniSONE 10 MG tablet ?Commonly known as: DELTASONE ?Take 4 tablets (40 mg total) by mouth daily for 3 days, THEN 3 tablets (30 mg total) daily for 3 days, THEN 2 tablets (20 mg total) daily for 3 days, THEN 1 tablet (10 mg total) daily for 3 days. ?Start taking on: December 29, 2021 ?  ?rosuvastatin 10 MG tablet ?Commonly known as: CRESTOR ?Take 1 tablet (10 mg total) by mouth every evening. ?  ?senna 8.6 MG tablet ?Commonly known as: SENOKOT ?Take 2-3  tablets by mouth daily as needed for constipation. ?  ?sertraline 25 MG tablet ?Commonly known as: ZOLOFT ?Take 1 tablet (25 mg total) by mouth every evening. ?  ?tamsulosin 0.4 MG Caps capsule ?Commonly kn

## 2022-01-10 NOTE — Hospital Course (Signed)
HPI per Dr. Carlynn Purl on 01/09/22 ? John [name] is a 81 y.o. male with medical history significant of anxiety, BPH, coronary artery disease, depression, diabetes mellitus type 2, GERD, hyperlipidemia, hypertension, and more presents to ED with a chief complaint of dyspnea.  Patient reports it was gradual onset and started around 3 PM this afternoon.  He had 5 pounds of weight gain during the day.  He he has dyspnea on exertion and orthopnea.  Patient denies cough, chest pain.  He reports he does have palpitations at times but they are vague and the duration is unknown.  Patient denies any peripheral edema, but reports it is normal for him to get swelling in his abdomen which he does feel.  He denies any chest pain.  He reports that since being taken off of Lasix his urine output is decreased.  Patient has no other complaints at this time. ?  ?Patient does not smoke, does drink alcohol, reports its 2 glasses of wine, and not every day.  Does not do illicit drugs.  He is vaccinated for COVID.  Patient is full code. ? ?**Interim History ?Patient was given diuresis and improved and his diuresis was held given that his renal function went up slowly.  Cardiology recommended transitioning to oral torsemide and discharging home.  PT OT recommended no follow-up.  He was a little hypoglycemic this morning so his insulin regimen was adjusted.  He is deemed medically stable to be discharged home at this time and continue to follow-up with his PCP and cardiologist in outpatient setting. ?

## 2022-01-16 NOTE — Progress Notes (Signed)
? ? ? ? ?HPI:FU CAD and chronic diastolic CHF. Patient states he suffered a myocardial infarction at age 81 in [place]. He had a cardiac catheterization but medical therapy was recommended. Cardiac catheterization in June of 2013 in [place] showed EF 40% and there was hypokinesis of the anterior wall. There was a 30-40% mid right coronary artery and a 30-40% mid PDA. The left main was normal. There was an 80% proximal LAD and a 70-80% mid lesion. The circumflex had a 90% lesion after the origin of a first marginal but continued mainly as a moderate size atrial branch. The patient had PCI of his LAD with 2 Ion drug-eluting stents. Medical therapy recommended for the circumflex.  Also with peripheral vascular disease with history of femoropopliteal August 2019.  Patient admitted March 2023 with non-ST elevation myocardial infarction and congestive heart failure.  Also new onset atrial fibrillation.  Because of significant renal insufficiency cardiac catheterization was not pursued.  He was readmitted with recurrent CHF later in the same month.  He was diuresed with some improvement.  Last echocardiogram March 2023 showed ejection fraction 35%, mild left ventricular enlargement, mild left ventricular hypertrophy, grade 2 diastolic dysfunction.  Since last seen he has some dyspnea on exertion.  He denies orthopnea, PND, pedal edema, chest pain or syncope. ? ?Current Outpatient Medications  ?Medication Sig Dispense Refill  ? albuterol (VENTOLIN HFA) 108 (90 Base) MCG/ACT inhaler Inhale 2 puffs into the lungs every 6 (six) hours as needed for wheezing or shortness of breath. 8 g 2  ? allopurinol (ZYLOPRIM) 300 MG tablet Take 300 mg by mouth daily.    ? apixaban (ELIQUIS) 2.5 MG TABS tablet Take 1 tablet (2.5 mg total) by mouth 2 (two) times daily. 60 tablet 0  ? carvedilol (COREG) 12.5 MG tablet Take 1 tablet (12.5 mg total) by mouth 2 (two) times daily with a meal. 30 tablet 5  ? cholecalciferol (VITAMIN D3) 25  MCG (1000 UNIT) tablet Take 1,000 Units by mouth daily.    ? clopidogrel (PLAVIX) 75 MG tablet Take 1 tablet (75 mg total) by mouth daily. 30 tablet 0  ? Cyanocobalamin (VITAMIN B 12 PO) Take 1 tablet by mouth daily.    ? folic acid (FOLVITE) 1 MG tablet Take 1 tablet (1 mg total) by mouth daily. 30 tablet 0  ? gabapentin (NEURONTIN) 300 MG capsule Take 1 capsule (300 mg total) by mouth at bedtime. 30 capsule 0  ? hydrALAZINE (APRESOLINE) 25 MG tablet Take 1.5 tablets (37.5 mg total) by mouth 2 (two) times daily. 90 tablet 0  ? HYDROcodone-acetaminophen (NORCO/VICODIN) 5-325 MG tablet Take 1 tablet by mouth every 4 (four) hours as needed for moderate pain. 20 tablet 0  ? insulin glargine (LANTUS SOLOSTAR) 100 UNIT/ML Solostar Pen Inject 50 Units into the skin daily. Dose per sliding scale. 15 mL 11  ? Insulin Pen Needle 32G X 4 MM MISC Use to inject insulin up to 4 times daily. 100 each 0  ? isosorbide dinitrate (ISORDIL) 20 MG tablet Take 1 tablet (20 mg total) by mouth 2 (two) times daily. 60 tablet 0  ? Multiple Vitamins-Minerals (CERTAVITE/ANTIOXIDANTS) TABS Take 1 tablet by mouth daily. 30 tablet 0  ? pantoprazole (PROTONIX) 40 MG tablet Take 40 mg by mouth daily with breakfast.    ? rosuvastatin (CRESTOR) 10 MG tablet Take 1 tablet (10 mg total) by mouth every evening. 30 tablet 0  ? senna (SENOKOT) 8.6 MG tablet Take 2-3 tablets by mouth  daily as needed for constipation.    ? sertraline (ZOLOFT) 25 MG tablet Take 1 tablet (25 mg total) by mouth every evening. 90 tablet 0  ? tamsulosin (FLOMAX) 0.4 MG CAPS capsule Take 0.4 mg by mouth daily.    ? thiamine 100 MG tablet Take 1 tablet (100 mg total) by mouth daily. 30 tablet 0  ? torsemide (DEMADEX) 20 MG tablet Take 1 tablet (20 mg total) by mouth daily. 30 tablet 0  ? ?No current facility-administered medications for this visit.  ? ? ? ?Past Medical History:  ?Diagnosis Date  ? Anxiety   ? 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 BPH (benign prostatic hyperplasia)   ? CAD  (coronary artery disease)   ? Cholesteatoma of attic   ? Depression   ? Diabetes mellitus ([place])   ? Ear disease   ? GERD (gastroesophageal reflux disease)   ? Hyperlipidemia   ? Hypertension   ? PVD (peripheral vascular disease) ([place])   ? Stroke [facility])   ? ? ?Past Surgical History:  ?Procedure Laterality Date  ? BONE ANCHORED HEARING AID IMPLANT Left 06/25/2013  ? Procedure: BONE ANCHORED HEARING AID (BAHA) IMPLANT;  Surgeon: Fannie Knee, MD;  Location: [facility];  Service: ENT;  Laterality: Left;  ? CAROTID STENT INSERTION Left   ? COCHLEAR IMPLANT    ? CORONARY STENT PLACEMENT    ? MASS EXCISION Left 06/25/2013  ? Procedure: EXCISION LEFT TEMPORAL MASS;  Surgeon: Fannie Knee, MD;  Location: [facility];  Service: ENT;  Laterality: Left;  ? TONSILLECTOMY    ? ? ?Social History  ? ?Socioeconomic History  ? Marital status: Widowed  ?  Spouse name: Not on file  ? Number of children: 3  ? Years of education: Not on file  ? Highest education level: Not on file  ?Occupational History  ? Not on file  ?Tobacco Use  ? Smoking status: Former  ?  Types: Cigarettes  ?  Quit date: 05/19/1989  ?  Years since quitting: 32.7  ? Smokeless tobacco: Never  ?Substance and Sexual Activity  ? Alcohol use: Not Currently  ? Drug use: No  ? Sexual activity: Not on file  ?Other Topics Concern  ? Not on file  ?Social History Narrative  ? Not on file  ? ?Social Determinants of Health  ? ?Financial Resource Strain: Not on file  ?Food Insecurity: Not on file  ?Transportation Needs: Not on file  ?Physical Activity: Not on file  ?Stress: Not on file  ?Social Connections: Not on file  ?Intimate Partner Violence: Not on file  ? ? ?Family History  ?Problem Relation Age of Onset  ? Diabetes Other   ? Diabetes Mother   ? Diabetes Maternal Aunt   ? Depression Other   ? Depression Other   ? Drug abuse Daughter   ? Diabetes Daughter   ? ? ?ROS: no fevers or chills, productive cough, hemoptysis, dysphasia, odynophagia, melena,  hematochezia, dysuria, hematuria, rash, seizure activity, orthopnea, PND, pedal edema, claudication. Remaining systems are negative. ? ?Physical Exam: ?Well-developed well-nourished in no acute distress.  ?Skin is warm and dry.  ?HEENT is normal.  ?Neck is supple.  ?Chest is clear to auscultation with normal expansion.  ?Cardiovascular exam is regular rate and rhythm.  ?Abdominal exam nontender or distended. No masses palpated. ?Extremities show no edema. ?neuro grossly intact ? ?ECG-normal sinus rhythm with PACs and PVCs, septal infarct, lateral T wave inversion.  Personally reviewed ? ?A/P ? ?  1 chronic systolic congestive heart failure-patient appears to be euvolemic today.  We will continue Demadex at present dose.  Check potassium and renal function.  We have not added Farxiga due to poor GFR. ? ?2 ischemic cardiomyopathy-continue hydralazine/nitrates and carvedilol.  No ARB or Entresto due to renal insufficiency. ? ?3 coronary artery disease status post recent non-ST elevation myocardial infarction-He is not having chest pain.  We discussed options today of medical therapy versus cardiac catheterization.  I explained the risk of contrast nephropathy particularly in light of his underlying diabetes mellitus and renal insufficiency.  He would prefer conservative measures for now.  We will continue medical therapy including Plavix, statin and beta-blocker. ? ?4 paroxysmal atrial fibrillation-plan to continue apixaban.  Continue carvedilol. ? ?5 chronic stage IIIb kidney disease-recheck renal function with recent addition of Demadex.  In 2 weeks we will check potassium, renal function and BNP. ? ?6 hyperlipidemia-continue statin. ? ?7 hypertension-patient's blood pressure is controlled.  Continue present medical regimen. ? ?8 history of peripheral vascular disease-followed at [facility]. ? ?9 anxiety-he is having some anxiety at home.  We will give him Xanax 0.25 mg twice daily as needed. ? ?Kirk Ruths, MD ? ? ? ?

## 2022-01-18 ENCOUNTER — Other Ambulatory Visit (HOSPITAL_COMMUNITY): Payer: Self-pay

## 2022-01-18 NOTE — Telephone Encounter (Signed)
01/18/22  ? ?PAYMENT PROCESSED - COMPLETE ?

## 2022-01-23 ENCOUNTER — Encounter: Payer: Self-pay | Admitting: Cardiology

## 2022-01-23 ENCOUNTER — Ambulatory Visit (INDEPENDENT_AMBULATORY_CARE_PROVIDER_SITE_OTHER): Payer: Medicare Other | Admitting: Cardiology

## 2022-01-23 VITALS — BP 140/60 | HR 83 | Resp 20 | Ht 66.0 in | Wt 193.6 lb

## 2022-01-23 DIAGNOSIS — I5022 Chronic systolic (congestive) heart failure: Secondary | ICD-10-CM | POA: Diagnosis not present

## 2022-01-23 DIAGNOSIS — I25810 Atherosclerosis of coronary artery bypass graft(s) without angina pectoris: Secondary | ICD-10-CM | POA: Diagnosis not present

## 2022-01-23 DIAGNOSIS — E7800 Pure hypercholesterolemia, unspecified: Secondary | ICD-10-CM

## 2022-01-23 DIAGNOSIS — I10 Essential (primary) hypertension: Secondary | ICD-10-CM | POA: Diagnosis not present

## 2022-01-23 MED ORDER — ALPRAZOLAM 0.25 MG PO TABS: 0.2500 mg | ORAL_TABLET | Freq: Two times a day (BID) | ORAL | 3 refills | Status: DC | PRN

## 2022-01-23 NOTE — Patient Instructions (Signed)
Medication Instructions:  ? ?START ALPRAZOLAM 0.25 MG ONE TABLET TWICE DAILY AS NEEDED ? ?*If you need a refill on your cardiac medications before your next appointment, please call your pharmacy* ? ? ?Lab Work: ? ?Your physician recommends that you return for lab work in: 2 [place] ? ?If you have labs (blood work) drawn today and your tests are completely normal, you will receive your results only by: ?MyChart Message (if you have MyChart) OR ?A paper copy in the mail ?If you have any lab test that is abnormal or we need to change your treatment, we will call you to review the results. ? ? ?Follow-Up: ?At [facility], you and your health needs are our priority.  As part of our continuing mission to provide you with exceptional heart care, we have created designated Provider Care Teams.  These Care Teams include your primary Cardiologist (physician) and Advanced Practice Providers (APPs -  Physician Assistants and Nurse Practitioners) who all work together to provide you with the care you need, when you need it. ? ?We recommend signing up for the patient portal called "MyChart".  Sign up information is provided on this After Visit Summary.  MyChart is used to connect with patients for Virtual Visits (Telemedicine).  Patients are able to view lab/test results, encounter notes, upcoming appointments, etc.  Non-urgent messages can be sent to your provider as well.   ?To learn more about what you can do with MyChart, go to [email or web address].   ? ?Your next appointment:   ?3 month(s) ? ?The format for your next appointment:   ?In Person ? ?Provider:   ?Kirk Ruths, MD   ? ? ? ?

## 2022-01-24 ENCOUNTER — Other Ambulatory Visit (HOSPITAL_COMMUNITY): Payer: Self-pay

## 2022-01-26 ENCOUNTER — Inpatient Hospital Stay (HOSPITAL_COMMUNITY)
Admission: EM | Admit: 2022-01-26 | Discharge: 2022-02-06 | DRG: 190 | Disposition: A | Discharge status: Skilled Nursing Facility | Payer: Medicare Other | Attending: Internal Medicine | Admitting: Internal Medicine

## 2022-01-26 ENCOUNTER — Encounter (HOSPITAL_COMMUNITY): Payer: Self-pay | Admitting: Emergency Medicine

## 2022-01-26 ENCOUNTER — Other Ambulatory Visit: Payer: Self-pay

## 2022-01-26 ENCOUNTER — Inpatient Hospital Stay (HOSPITAL_COMMUNITY): Payer: Medicare Other

## 2022-01-26 ENCOUNTER — Emergency Department (HOSPITAL_COMMUNITY): Payer: Medicare Other

## 2022-01-26 ENCOUNTER — Other Ambulatory Visit (HOSPITAL_COMMUNITY): Payer: Self-pay | Admitting: *Deleted

## 2022-01-26 DIAGNOSIS — E1169 Type 2 diabetes mellitus with other specified complication: Secondary | ICD-10-CM

## 2022-01-26 DIAGNOSIS — Z23 Encounter for immunization: Secondary | ICD-10-CM

## 2022-01-26 DIAGNOSIS — I739 Peripheral vascular disease, unspecified: Secondary | ICD-10-CM | POA: Diagnosis present

## 2022-01-26 DIAGNOSIS — F419 Anxiety disorder, unspecified: Secondary | ICD-10-CM | POA: Diagnosis present

## 2022-01-26 DIAGNOSIS — I358 Other nonrheumatic aortic valve disorders: Secondary | ICD-10-CM | POA: Diagnosis present

## 2022-01-26 DIAGNOSIS — N184 Chronic kidney disease, stage 4 (severe): Secondary | ICD-10-CM | POA: Diagnosis present

## 2022-01-26 DIAGNOSIS — N400 Enlarged prostate without lower urinary tract symptoms: Secondary | ICD-10-CM | POA: Diagnosis not present

## 2022-01-26 DIAGNOSIS — I480 Paroxysmal atrial fibrillation: Secondary | ICD-10-CM | POA: Diagnosis not present

## 2022-01-26 DIAGNOSIS — I130 Hypertensive heart and chronic kidney disease with heart failure and stage 1 through stage 4 chronic kidney disease, or unspecified chronic kidney disease: Secondary | ICD-10-CM | POA: Diagnosis present

## 2022-01-26 DIAGNOSIS — G4733 Obstructive sleep apnea (adult) (pediatric): Secondary | ICD-10-CM | POA: Diagnosis present

## 2022-01-26 DIAGNOSIS — N401 Enlarged prostate with lower urinary tract symptoms: Secondary | ICD-10-CM | POA: Diagnosis not present

## 2022-01-26 DIAGNOSIS — Z20822 Contact with and (suspected) exposure to covid-19: Secondary | ICD-10-CM | POA: Diagnosis present

## 2022-01-26 DIAGNOSIS — J449 Chronic obstructive pulmonary disease, unspecified: Secondary | ICD-10-CM

## 2022-01-26 DIAGNOSIS — J90 Pleural effusion, not elsewhere classified: Secondary | ICD-10-CM | POA: Diagnosis not present

## 2022-01-26 DIAGNOSIS — I248 Other forms of acute ischemic heart disease: Secondary | ICD-10-CM | POA: Diagnosis present

## 2022-01-26 DIAGNOSIS — J9621 Acute and chronic respiratory failure with hypoxia: Secondary | ICD-10-CM | POA: Diagnosis present

## 2022-01-26 DIAGNOSIS — R0602 Shortness of breath: Secondary | ICD-10-CM | POA: Diagnosis present

## 2022-01-26 DIAGNOSIS — E1122 Type 2 diabetes mellitus with diabetic chronic kidney disease: Secondary | ICD-10-CM | POA: Diagnosis present

## 2022-01-26 DIAGNOSIS — D631 Anemia in chronic kidney disease: Secondary | ICD-10-CM | POA: Diagnosis present

## 2022-01-26 DIAGNOSIS — Z955 Presence of coronary angioplasty implant and graft: Secondary | ICD-10-CM | POA: Diagnosis not present

## 2022-01-26 DIAGNOSIS — Z794 Long term (current) use of insulin: Secondary | ICD-10-CM

## 2022-01-26 DIAGNOSIS — N189 Chronic kidney disease, unspecified: Secondary | ICD-10-CM

## 2022-01-26 DIAGNOSIS — Z8673 Personal history of transient ischemic attack (TIA), and cerebral infarction without residual deficits: Secondary | ICD-10-CM

## 2022-01-26 DIAGNOSIS — E782 Mixed hyperlipidemia: Secondary | ICD-10-CM | POA: Diagnosis present

## 2022-01-26 DIAGNOSIS — Z66 Do not resuscitate: Secondary | ICD-10-CM | POA: Diagnosis present

## 2022-01-26 DIAGNOSIS — Z9861 Coronary angioplasty status: Secondary | ICD-10-CM | POA: Diagnosis not present

## 2022-01-26 DIAGNOSIS — Z7902 Long term (current) use of antithrombotics/antiplatelets: Secondary | ICD-10-CM

## 2022-01-26 DIAGNOSIS — R338 Other retention of urine: Secondary | ICD-10-CM | POA: Diagnosis present

## 2022-01-26 DIAGNOSIS — I5023 Acute on chronic systolic (congestive) heart failure: Secondary | ICD-10-CM | POA: Diagnosis not present

## 2022-01-26 DIAGNOSIS — I447 Left bundle-branch block, unspecified: Secondary | ICD-10-CM | POA: Diagnosis present

## 2022-01-26 DIAGNOSIS — R778 Other specified abnormalities of plasma proteins: Secondary | ICD-10-CM | POA: Diagnosis present

## 2022-01-26 DIAGNOSIS — N183 Chronic kidney disease, stage 3 unspecified: Secondary | ICD-10-CM | POA: Diagnosis not present

## 2022-01-26 DIAGNOSIS — Z833 Family history of diabetes mellitus: Secondary | ICD-10-CM

## 2022-01-26 DIAGNOSIS — I471 Supraventricular tachycardia: Secondary | ICD-10-CM | POA: Diagnosis present

## 2022-01-26 DIAGNOSIS — I5021 Acute systolic (congestive) heart failure: Secondary | ICD-10-CM

## 2022-01-26 DIAGNOSIS — J918 Pleural effusion in other conditions classified elsewhere: Secondary | ICD-10-CM | POA: Diagnosis present

## 2022-01-26 DIAGNOSIS — I252 Old myocardial infarction: Secondary | ICD-10-CM

## 2022-01-26 DIAGNOSIS — R32 Unspecified urinary incontinence: Secondary | ICD-10-CM | POA: Diagnosis present

## 2022-01-26 DIAGNOSIS — J9622 Acute and chronic respiratory failure with hypercapnia: Secondary | ICD-10-CM | POA: Diagnosis present

## 2022-01-26 DIAGNOSIS — K219 Gastro-esophageal reflux disease without esophagitis: Secondary | ICD-10-CM | POA: Diagnosis present

## 2022-01-26 DIAGNOSIS — Z888 Allergy status to other drugs, medicaments and biological substances status: Secondary | ICD-10-CM

## 2022-01-26 DIAGNOSIS — Z91199 Patient's noncompliance with other medical treatment and regimen due to unspecified reason: Secondary | ICD-10-CM

## 2022-01-26 DIAGNOSIS — Z7901 Long term (current) use of anticoagulants: Secondary | ICD-10-CM

## 2022-01-26 DIAGNOSIS — I472 Ventricular tachycardia, unspecified: Secondary | ICD-10-CM | POA: Diagnosis not present

## 2022-01-26 DIAGNOSIS — I2510 Atherosclerotic heart disease of native coronary artery without angina pectoris: Secondary | ICD-10-CM | POA: Diagnosis present

## 2022-01-26 DIAGNOSIS — R231 Pallor: Secondary | ICD-10-CM | POA: Diagnosis not present

## 2022-01-26 DIAGNOSIS — J441 Chronic obstructive pulmonary disease with (acute) exacerbation: Secondary | ICD-10-CM | POA: Diagnosis not present

## 2022-01-26 DIAGNOSIS — I5043 Acute on chronic combined systolic (congestive) and diastolic (congestive) heart failure: Secondary | ICD-10-CM | POA: Diagnosis present

## 2022-01-26 DIAGNOSIS — Z79899 Other long term (current) drug therapy: Secondary | ICD-10-CM

## 2022-01-26 DIAGNOSIS — Z880 Allergy status to penicillin: Secondary | ICD-10-CM

## 2022-01-26 DIAGNOSIS — F32A Depression, unspecified: Secondary | ICD-10-CM | POA: Diagnosis present

## 2022-01-26 DIAGNOSIS — E1151 Type 2 diabetes mellitus with diabetic peripheral angiopathy without gangrene: Secondary | ICD-10-CM | POA: Diagnosis present

## 2022-01-26 DIAGNOSIS — Z87891 Personal history of nicotine dependence: Secondary | ICD-10-CM

## 2022-01-26 DIAGNOSIS — R339 Retention of urine, unspecified: Secondary | ICD-10-CM

## 2022-01-26 DIAGNOSIS — I10 Essential (primary) hypertension: Secondary | ICD-10-CM | POA: Diagnosis present

## 2022-01-26 DIAGNOSIS — D72829 Elevated white blood cell count, unspecified: Secondary | ICD-10-CM | POA: Diagnosis present

## 2022-01-26 DIAGNOSIS — E1165 Type 2 diabetes mellitus with hyperglycemia: Secondary | ICD-10-CM | POA: Diagnosis present

## 2022-01-26 DIAGNOSIS — Z818 Family history of other mental and behavioral disorders: Secondary | ICD-10-CM

## 2022-01-26 DIAGNOSIS — T502X5A Adverse effect of carbonic-anhydrase inhibitors, benzothiadiazides and other diuretics, initial encounter: Secondary | ICD-10-CM | POA: Diagnosis not present

## 2022-01-26 LAB — BLOOD GAS, VENOUS
Acid-Base Excess: 3.7 mmol/L — ABNORMAL HIGH (ref 0.0–2.0)
Bicarbonate: 34.8 mmol/L — ABNORMAL HIGH (ref 20.0–28.0)
Drawn by: 442
FIO2: 80 %
O2 Saturation: 95.5 %
Patient temperature: 36.9
pCO2, Ven: 89 mmHg (ref 44–60)
pH, Ven: 7.2 — ABNORMAL LOW (ref 7.25–7.43)
pO2, Ven: 78 mmHg — ABNORMAL HIGH (ref 32–45)

## 2022-01-26 LAB — COMPREHENSIVE METABOLIC PANEL
ALT: 25 U/L (ref 0–44)
AST: 24 U/L (ref 15–41)
Albumin: 3.5 g/dL (ref 3.5–5.0)
Alkaline Phosphatase: 77 U/L (ref 38–126)
Anion gap: 7 (ref 5–15)
BUN: 33 mg/dL — ABNORMAL HIGH (ref 8–23)
CO2: 31 mmol/L (ref 22–32)
Calcium: 8.5 mg/dL — ABNORMAL LOW (ref 8.9–10.3)
Chloride: 98 mmol/L (ref 98–111)
Creatinine, Ser: 2.17 mg/dL — ABNORMAL HIGH (ref 0.61–1.24)
GFR, Estimated: 29 mL/min — ABNORMAL LOW (ref >60)
Glucose, Bld: 228 mg/dL — ABNORMAL HIGH (ref 70–99)
Potassium: 4.1 mmol/L (ref 3.5–5.1)
Sodium: 136 mmol/L (ref 135–145)
Total Bilirubin: 0.8 mg/dL (ref 0.3–1.2)
Total Protein: 6.8 g/dL (ref 6.5–8.1)

## 2022-01-26 LAB — CBC WITH DIFFERENTIAL/PLATELET
Abs Immature Granulocytes: 0.06 10*3/uL (ref 0.00–0.07)
Basophils Absolute: 0 10*3/uL (ref 0.0–0.1)
Basophils Relative: 0 %
Eosinophils Absolute: 0.4 10*3/uL (ref 0.0–0.5)
Eosinophils Relative: 5 %
HCT: 35.5 % — ABNORMAL LOW (ref 39.0–52.0)
Hemoglobin: 10.8 g/dL — ABNORMAL LOW (ref 13.0–17.0)
Immature Granulocytes: 1 %
Lymphocytes Relative: 13 %
Lymphs Abs: 1.2 10*3/uL (ref 0.7–4.0)
MCH: 29.2 pg (ref 26.0–34.0)
MCHC: 30.4 g/dL (ref 30.0–36.0)
MCV: 95.9 fL (ref 80.0–100.0)
Monocytes Absolute: 0.6 10*3/uL (ref 0.1–1.0)
Monocytes Relative: 7 %
Neutro Abs: 6.6 10*3/uL (ref 1.7–7.7)
Neutrophils Relative %: 74 %
Platelets: 212 10*3/uL (ref 150–400)
RBC: 3.7 MIL/uL — ABNORMAL LOW (ref 4.22–5.81)
RDW: 15.8 % — ABNORMAL HIGH (ref 11.5–15.5)
WBC: 9 10*3/uL (ref 4.0–10.5)
nRBC: 0.3 % — ABNORMAL HIGH (ref 0.0–0.2)

## 2022-01-26 LAB — RESPIRATORY PANEL BY PCR

## 2022-01-26 LAB — GLUCOSE, CAPILLARY
Glucose-Capillary: 204 mg/dL — ABNORMAL HIGH (ref 70–99)
Glucose-Capillary: 180 mg/dL — ABNORMAL HIGH (ref 70–99)
Glucose-Capillary: 323 mg/dL — ABNORMAL HIGH (ref 70–99)

## 2022-01-26 LAB — ECHOCARDIOGRAM LIMITED
Height: 67 in
S' Lateral: 4.9 cm
Weight: 3061.75 oz

## 2022-01-26 LAB — TROPONIN I (HIGH SENSITIVITY)
Troponin I (High Sensitivity): 40 ng/L — ABNORMAL HIGH (ref <18)
Troponin I (High Sensitivity): 39 ng/L — ABNORMAL HIGH (ref <18)

## 2022-01-26 LAB — URINALYSIS, ROUTINE W REFLEX MICROSCOPIC
Bilirubin Urine: NEGATIVE
Glucose, UA: 150 mg/dL — AB
Hgb urine dipstick: NEGATIVE
Ketones, ur: NEGATIVE mg/dL
Leukocytes,Ua: NEGATIVE
Nitrite: NEGATIVE
Protein, ur: NEGATIVE mg/dL
Specific Gravity, Urine: 1.011 (ref 1.005–1.030)
pH: 5 (ref 5.0–8.0)

## 2022-01-26 LAB — CBG MONITORING, ED: Glucose-Capillary: 201 mg/dL — ABNORMAL HIGH (ref 70–99)

## 2022-01-26 LAB — BRAIN NATRIURETIC PEPTIDE: B Natriuretic Peptide: 710 pg/mL — ABNORMAL HIGH (ref 0.0–100.0)

## 2022-01-26 LAB — MRSA NEXT GEN BY PCR, NASAL: MRSA by PCR Next Gen: NOT DETECTED

## 2022-01-26 LAB — MAGNESIUM: Magnesium: 2.2 mg/dL (ref 1.7–2.4)

## 2022-01-26 LAB — PHOSPHORUS: Phosphorus: 4.5 mg/dL (ref 2.5–4.6)

## 2022-01-26 LAB — PROCALCITONIN: Procalcitonin: <0.1 ng/mL

## 2022-01-26 MED ORDER — ACETAMINOPHEN 325 MG PO TABS
650.0000 mg | ORAL_TABLET | Freq: Four times a day (QID) | ORAL | Status: DC | PRN
Start: 2022-01-26 — End: 2022-02-06
  Administered 2022-01-28 – 2022-02-05 (×6): 650 mg via ORAL
  Filled 2022-01-26 (×6): qty 2

## 2022-01-26 MED ORDER — FUROSEMIDE 10 MG/ML IJ SOLN
40.0000 mg | Freq: Two times a day (BID) | INTRAMUSCULAR | Status: DC
  Administered 2022-01-26 – 2022-01-30 (×8): 40 mg via INTRAVENOUS
  Filled 2022-01-26 (×8): qty 4

## 2022-01-26 MED ORDER — AZITHROMYCIN 250 MG PO TABS
500.0000 mg | ORAL_TABLET | Freq: Every day | ORAL | Status: DC
  Filled 2022-01-26: qty 2

## 2022-01-26 MED ORDER — TAMSULOSIN HCL 0.4 MG PO CAPS
0.4000 mg | ORAL_CAPSULE | Freq: Every day | ORAL | Status: DC
Start: 2022-01-26 — End: 2022-02-06
  Administered 2022-01-26 – 2022-02-06 (×12): 0.4 mg via ORAL
  Filled 2022-01-26 (×12): qty 1

## 2022-01-26 MED ORDER — IPRATROPIUM BROMIDE 0.02 % IN SOLN
0.5000 mg | Freq: Once | RESPIRATORY_TRACT | Status: AC
  Administered 2022-01-26: 0.5 mg via RESPIRATORY_TRACT
  Filled 2022-01-26: qty 2.5

## 2022-01-26 MED ORDER — AZITHROMYCIN 250 MG PO TABS: 250.0000 mg | ORAL_TABLET | Freq: Every day | ORAL | Status: DC

## 2022-01-26 MED ORDER — METHYLPREDNISOLONE SODIUM SUCC 125 MG IJ SOLR
125.0000 mg | Freq: Once | INTRAMUSCULAR | Status: AC
  Administered 2022-01-26: 125 mg via INTRAVENOUS
  Filled 2022-01-26: qty 2

## 2022-01-26 MED ORDER — FUROSEMIDE 10 MG/ML IJ SOLN
40.0000 mg | Freq: Once | INTRAMUSCULAR | Status: AC
  Administered 2022-01-26: 40 mg via INTRAVENOUS
  Filled 2022-01-26: qty 4

## 2022-01-26 MED ORDER — ALBUTEROL SULFATE (2.5 MG/3ML) 0.083% IN NEBU
10.0000 mg | INHALATION_SOLUTION | Freq: Once | RESPIRATORY_TRACT | Status: AC
  Administered 2022-01-26: 10 mg via RESPIRATORY_TRACT
  Filled 2022-01-26: qty 12

## 2022-01-26 MED ORDER — INSULIN ASPART 100 UNIT/ML IJ SOLN
0.0000 [IU] | Freq: Three times a day (TID) | INTRAMUSCULAR | Status: DC
  Administered 2022-01-26 (×2): 3 [IU] via SUBCUTANEOUS
  Administered 2022-01-26: 2 [IU] via SUBCUTANEOUS
  Administered 2022-01-27: 7 [IU] via SUBCUTANEOUS
  Administered 2022-01-27: 3 [IU] via SUBCUTANEOUS
  Administered 2022-01-27: 5 [IU] via SUBCUTANEOUS
  Administered 2022-01-28: 9 [IU] via SUBCUTANEOUS
  Administered 2022-01-28 (×2): 5 [IU] via SUBCUTANEOUS
  Administered 2022-01-29: 7 [IU] via SUBCUTANEOUS
  Administered 2022-01-29: 5 [IU] via SUBCUTANEOUS
  Administered 2022-01-29: 7 [IU] via SUBCUTANEOUS
  Administered 2022-01-30: 3 [IU] via SUBCUTANEOUS
  Administered 2022-01-30 – 2022-01-31 (×2): 2 [IU] via SUBCUTANEOUS
  Administered 2022-01-31: 3 [IU] via SUBCUTANEOUS
  Administered 2022-02-01: 2 [IU] via SUBCUTANEOUS
  Administered 2022-02-01: 1 [IU] via SUBCUTANEOUS
  Administered 2022-02-01: 5 [IU] via SUBCUTANEOUS
  Administered 2022-02-02: 3 [IU] via SUBCUTANEOUS
  Administered 2022-02-03 (×2): 2 [IU] via SUBCUTANEOUS
  Administered 2022-02-03: 9 [IU] via SUBCUTANEOUS
  Administered 2022-02-04 (×2): 5 [IU] via SUBCUTANEOUS
  Administered 2022-02-04: 3 [IU] via SUBCUTANEOUS
  Administered 2022-02-05 (×2): 1 [IU] via SUBCUTANEOUS
  Administered 2022-02-05: 3 [IU] via SUBCUTANEOUS
  Administered 2022-02-06: 2 [IU] via SUBCUTANEOUS
  Filled 2022-01-26: qty 1

## 2022-01-26 MED ORDER — INSULIN ASPART 100 UNIT/ML IJ SOLN
0.0000 [IU] | Freq: Every day | INTRAMUSCULAR | Status: DC
  Administered 2022-01-26: 4 [IU] via SUBCUTANEOUS
  Administered 2022-01-28: 3 [IU] via SUBCUTANEOUS
  Administered 2022-01-30: 2 [IU] via SUBCUTANEOUS
  Administered 2022-01-31 – 2022-02-01 (×2): 3 [IU] via SUBCUTANEOUS
  Administered 2022-02-03: 4 [IU] via SUBCUTANEOUS
  Administered 2022-02-04 – 2022-02-05 (×2): 2 [IU] via SUBCUTANEOUS

## 2022-01-26 MED ORDER — METHYLPREDNISOLONE SODIUM SUCC 40 MG IJ SOLR
40.0000 mg | Freq: Two times a day (BID) | INTRAMUSCULAR | Status: DC
  Administered 2022-01-26 – 2022-01-30 (×8): 40 mg via INTRAVENOUS
  Filled 2022-01-26 (×8): qty 1

## 2022-01-26 MED ORDER — INSULIN GLARGINE-YFGN 100 UNIT/ML ~~LOC~~ SOLN
10.0000 [IU] | Freq: Every day | SUBCUTANEOUS | Status: DC
  Administered 2022-01-26 – 2022-01-27 (×2): 10 [IU] via SUBCUTANEOUS
  Filled 2022-01-26 (×3): qty 0.1

## 2022-01-26 MED ORDER — IPRATROPIUM-ALBUTEROL 0.5-2.5 (3) MG/3ML IN SOLN
3.0000 mL | RESPIRATORY_TRACT | Status: DC | PRN
  Administered 2022-01-31: 3 mL via RESPIRATORY_TRACT
  Filled 2022-01-26: qty 3

## 2022-01-26 MED ORDER — ENOXAPARIN SODIUM 30 MG/0.3ML IJ SOSY
30.0000 mg | PREFILLED_SYRINGE | INTRAMUSCULAR | Status: DC
  Administered 2022-01-26: 30 mg via SUBCUTANEOUS
  Filled 2022-01-26: qty 0.3

## 2022-01-26 MED ORDER — CARVEDILOL 12.5 MG PO TABS
12.5000 mg | ORAL_TABLET | Freq: Two times a day (BID) | ORAL | Status: DC
  Administered 2022-01-26 – 2022-01-30 (×8): 12.5 mg via ORAL
  Filled 2022-01-26 (×8): qty 1

## 2022-01-26 MED ORDER — PANTOPRAZOLE SODIUM 40 MG PO TBEC
40.0000 mg | DELAYED_RELEASE_TABLET | Freq: Every day | ORAL | Status: DC
  Administered 2022-01-27 – 2022-02-06 (×11): 40 mg via ORAL
  Filled 2022-01-26 (×11): qty 1

## 2022-01-26 MED ORDER — SODIUM CHLORIDE 0.9 % IV SOLN
500.0000 mg | INTRAVENOUS | Status: DC
  Administered 2022-01-26 – 2022-01-31 (×6): 500 mg via INTRAVENOUS
  Filled 2022-01-26 (×6): qty 5

## 2022-01-26 MED ORDER — SODIUM CHLORIDE 0.9 % IV SOLN: INTRAVENOUS | Status: DC | PRN

## 2022-01-26 MED ORDER — CHLORHEXIDINE GLUCONATE CLOTH 2 % EX PADS
6.0000 | MEDICATED_PAD | Freq: Every day | CUTANEOUS | Status: DC
  Administered 2022-01-26: 6 via TOPICAL

## 2022-01-26 MED ORDER — CHLORHEXIDINE GLUCONATE 0.12 % MT SOLN
15.0000 mL | Freq: Two times a day (BID) | OROMUCOSAL | Status: DC
  Administered 2022-01-26 – 2022-02-06 (×21): 15 mL via OROMUCOSAL
  Filled 2022-01-26 (×22): qty 15

## 2022-01-26 MED ORDER — ROSUVASTATIN CALCIUM 10 MG PO TABS
10.0000 mg | ORAL_TABLET | Freq: Every evening | ORAL | Status: DC
  Administered 2022-01-26 – 2022-02-05 (×11): 10 mg via ORAL
  Filled 2022-01-26 (×11): qty 1

## 2022-01-26 MED ORDER — ORAL CARE MOUTH RINSE
15.0000 mL | Freq: Two times a day (BID) | OROMUCOSAL | Status: DC
  Administered 2022-01-26 – 2022-02-03 (×12): 15 mL via OROMUCOSAL

## 2022-01-26 MED ORDER — ISOSORBIDE DINITRATE 20 MG PO TABS
20.0000 mg | ORAL_TABLET | Freq: Two times a day (BID) | ORAL | Status: DC
  Administered 2022-01-26 – 2022-02-06 (×23): 20 mg via ORAL
  Filled 2022-01-26 (×23): qty 1

## 2022-01-26 MED ORDER — PERFLUTREN LIPID MICROSPHERE
1.0000 mL | INTRAVENOUS | Status: AC | PRN
  Administered 2022-01-26: 5 mL via INTRAVENOUS

## 2022-01-26 MED ORDER — CHLORHEXIDINE GLUCONATE CLOTH 2 % EX PADS
6.0000 | MEDICATED_PAD | Freq: Every day | CUTANEOUS | Status: DC
  Administered 2022-01-28 – 2022-02-03 (×7): 6 via TOPICAL

## 2022-01-26 MED ORDER — FUROSEMIDE 10 MG/ML IJ SOLN: 40.0000 mg | Freq: Two times a day (BID) | INTRAMUSCULAR | Status: DC

## 2022-01-26 MED ORDER — IPRATROPIUM-ALBUTEROL 0.5-2.5 (3) MG/3ML IN SOLN
3.0000 mL | Freq: Four times a day (QID) | RESPIRATORY_TRACT | Status: DC
  Administered 2022-01-26 – 2022-01-29 (×12): 3 mL via RESPIRATORY_TRACT
  Filled 2022-01-26 (×8): qty 3
  Filled 2022-01-26: qty 6
  Filled 2022-01-26 (×2): qty 3

## 2022-01-26 MED ORDER — APIXABAN 2.5 MG PO TABS
2.5000 mg | ORAL_TABLET | Freq: Two times a day (BID) | ORAL | Status: DC
  Administered 2022-01-26 – 2022-01-31 (×11): 2.5 mg via ORAL
  Filled 2022-01-26 (×11): qty 1

## 2022-01-26 MED ORDER — CLOPIDOGREL BISULFATE 75 MG PO TABS
75.0000 mg | ORAL_TABLET | Freq: Every day | ORAL | Status: DC
  Administered 2022-01-26 – 2022-02-06 (×12): 75 mg via ORAL
  Filled 2022-01-26 (×12): qty 1

## 2022-01-26 MED ORDER — BUDESONIDE 0.5 MG/2ML IN SUSP
0.5000 mg | Freq: Two times a day (BID) | RESPIRATORY_TRACT | Status: DC
  Administered 2022-01-26 – 2022-02-06 (×23): 0.5 mg via RESPIRATORY_TRACT
  Filled 2022-01-26 (×23): qty 2

## 2022-01-26 NOTE — Assessment & Plan Note (Addendum)
Continue apixaban ?Continue carvedilol>>changed to metoprolol ?Optimize potassium ?Currently in sinus ?

## 2022-01-26 NOTE — Progress Notes (Signed)
Notified by nurse that patient did NOT want DNR and that he wanted to be full code. ? ?I came to the bedside to discussed goals or care and advanced directives. ? ?Advance care planning, including the explanation and discussion of advance directives was carried out with the patient and family.  Code status including explanations of "Full Code" and "DNR" and alternatives were discussed in detail.  Discussion of end-of-life issues including but not limited palliative care, hospice care and the concept of hospice, other end-of-life care options, power of attorney for health care decisions, living wills, and physician orders for life-sustaining treatment were also discussed with the patient and family.  Total face to face time 16 minutes. ? ?Patient confirmed understanding using teach back method.  He confirmed DNR.  I discussed that we would place and arm band indicating he is "DNR" and he agrees with this. ? ?Orson Eva, DO ? ?

## 2022-01-26 NOTE — ED Notes (Signed)
Pt daughter, [name], reports pt was diagnosed with A-fib the last time he was in hospital around 3/20, f/u with his cardiologists, but not started on any medications as pt was not in A-fib rhythm at time of f/u appt. Dr Matilde Sprang made aware.  ?

## 2022-01-26 NOTE — Assessment & Plan Note (Addendum)
3623 echo EF 35%, global HK, grade 2 DD ?Continue IV furosemide per cardiology ?Daily weights--not accurate ?Accurate I's and O's-=-incomplete ?4/11 High Res CT chest>>Persistent bilateral bronchial wall thickening and interlobularseptal thickening, likely due to pulmonary edema. ?Appreciate cardiology consult>>increase lasix to 80 mg IV bid ?

## 2022-01-26 NOTE — ED Triage Notes (Signed)
Pt arrives [facility] from home c/o SOB that increased during the night. Pt has CPAP at home but refuses to wear it. Pt was 65% on RA, placed on NRB15L sats improved to 100%. ?

## 2022-01-26 NOTE — Hospital Course (Addendum)
81 year old male with a history of recent NSTEMI 12/2021, diabetes mellitus type 2, hyperlipidemia, hypertension, paroxysmal atrial fibrillation, GERD, BPH, and CKD stage III presenting with shortness of breath and respiratory distress.  The patient presented with oxygen saturation 65% on room air.  He was initially placed on nonrebreather.  He was subsequently placed on BiPAP in the emergency department. ?He was recently admitted to the hospital from 01/08/2022 to 01/10/2022 for acute on chronic systolic and diastolic CHF.  He was discharged home with torsemide 20 mg daily.  He has been compliant with his medications and fluid restriction.  In fact, he has had decreased oral intake.  He has only gained 1 pound since discharge from the hospital.  His last weight was 194 pounds on the day of admission.  In addition, the patient also had a hospital admission from 12/24/2021 to 12/29/2021 for which he was treated medically for NSTEMI.  His troponin peaked >24K.  he was discharged home on Isordil, hydralazine, carvedilol, Crestor, and Plavix. ?Since discharge home from the hospital on 01/11/2022, the patient had remained short of breath with exertion.  His daughter states that he has essentially gradually declined since discharge.  After participating with occupational therapy on 01/25/2022, the patient became more short of breath as the day progressed.  He has not had any fevers, chills, headache, neck pain, chest pain, coughing, hemoptysis, nausea, vomiting or diarrhea. ?In the ED, the patient was afebrile hemodynamically stable with oxygen saturation 98-100% on BiPAP.  Troponins were flat 39>>40.  Initial labs showed WBC 9.0, Hgb 10.8, platelets 212, Na+ 136, K+ 4.1 and creatinine 2.17. AST 24 and ALT 25. Initial and repeat Hs Troponin flat at 40 and 39. BNP not yet ordered but will add-on to AM labs. 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 EKG showed sinus rhythm with PVCs.  The patient was started on IV furosemide,  bronchodilators, IV steroids, and azithromycin.  Cardiology and PCCM were consulted to assist ?

## 2022-01-26 NOTE — Assessment & Plan Note (Addendum)
Trends are flat ?No chest pain ?Personally reviewed EKG--sinus, LBBB, nonspecific STT ?Secondary to demand ischemia ?4/7 limited echo--EF 30-35%, trivial pericardial effusion, global HK ?Appreciate cardiology consult ?

## 2022-01-26 NOTE — Progress Notes (Signed)
?  ?       ?PROGRESS NOTE ? ?John [name] FYB:[id number] DOB: 1941/10/03 DOA: 01/26/2022 ?PCP: [name], DO ? ?Brief History:  ?81 year old male with a history of recent NSTEMI 12/2021, diabetes mellitus type 2, hyperlipidemia, hypertension, paroxysmal atrial fibrillation, GERD, BPH, and CKD stage III presenting with shortness of breath and respiratory distress.  The patient presented with oxygen saturation 65% on room air.  He was initially placed on nonrebreather.  He was subsequently placed on BiPAP in the emergency department. ?He was recently admitted to the hospital from 01/08/2022 to 01/10/2022 for acute on chronic systolic and diastolic CHF.  He was discharged home with torsemide 20 mg daily.  He has been compliant with his medications and fluid restriction.  In fact, he has had decreased oral intake.  He has only gained 1 pound since discharge from the hospital.  His last weight was 194 pounds on the day of admission.  In addition, the patient also had a hospital admission from 12/24/2021 to 12/29/2021 for which he was treated medically for NSTEMI.  His troponin peaked >24K.  he was discharged home on Isordil, hydralazine, carvedilol, Crestor, and Plavix. ?Since discharge home from the hospital on 01/11/2022, the patient had remained short of breath with exertion.  His daughter states that he has essentially gradually declined since discharge.  After participating with occupational therapy on 01/25/2022, the patient became more short of breath as the day progressed.  He has not had any fevers, chills, headache, neck pain, chest pain, coughing, hemoptysis, nausea, vomiting or diarrhea. ?In the ED, the patient was afebrile hemodynamically stable with oxygen saturation 98-100% on BiPAP.  Troponins were flat 39>>40.  Initial labs showed WBC 9.0, Hgb 10.8, platelets 212, Na+ 136, K+ 4.1 and creatinine 2.17. AST 24 and ALT 25. Initial and repeat Hs Troponin flat at 40 and 39. BNP not yet ordered but will add-on to AM  labs. 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 EKG showed sinus rhythm with PVCs.  The patient was started on IV furosemide, bronchodilators, IV steroids, and azithromycin.  ? ? ? ?Assessment and Plan: ?* Acute on chronic respiratory failure with hypoxia and hypercapnia (HCC) ?Secondary to COPD exacerbation and CHF exacerbation ?At baseline, patient is on 3 L nasal cannula ?Wean off BiPAP ?Wean oxygen for saturation greater 90% ? ?COPD with acute exacerbation ([place]) ?Start Pulmicort ?Continue DuoNebs ?Continue IV Solu-Medrol ? ?Acute on chronic combined systolic and diastolic CHF (congestive heart failure) ([place]) ?3623 echo EF 35%, global HK, grade 2 DD ?Continue IV furosemide ?Daily weights ?Accurate I's and O's ?Appreciate cardiology consult ? ?CKD (chronic kidney disease), stage IV ([place]) ?Baseline creatinine 2.0-2.3 ?Monitor with diuresis ? ?Paroxysmal atrial fibrillation (HCC) ?Continue apixaban ?Continue carvedilol ?Optimize potassium ? ?Coronary atherosclerosis of native coronary artery ?No chest pain presently ?Troponins are flat 39>>40 ?Continue Plavix, Crestor, carvedilol ? ?BPH (benign prostatic hyperplasia) ?Continue tamulosin ? ?Elevated troponin ?Trends are flat ?Secondary to demand ischemia ?Appreciate cardiology consult ? ?Type 2 diabetes mellitus with hyperglycemia (HCC) ?12/24/2021 hemoglobin A1c 7.1 ?NovoLog sliding scale ?Reduced dose Semglee ? ? ?Essential hypertension ?Continue coreg, hydralazine, isordil ? ?Peripheral vascular disease ([place]) ?- He is s/p right fem-pop bypass at [facility] in 05/2018. Remains on Plavix and statin therapy.  ? ?Mixed hyperlipidemia ?Continue statin ? ? ? ? ? ? ? ? ?Status is: Inpatient ?Remains inpatient appropriate because: severity of illness requiring BiPAP and IV lasix ? ? ? ?Family Communication:   daughter updated at bedside 4/7 ? ?  Consultants:  cardiology ? ?Code Status:  DNR ? ?DVT Prophylaxis: apixaban ? ? ?Procedures: ?As Listed in Progress Note  Above ? ?Antibiotics: ?Azthro 4/7>> ? ? ? ?The patient is critically ill with multiple organ systems failure and requires high complexity decision making for assessment and support, frequent evaluation and titration of therapies, application of advanced monitoring technologies and extensive interpretation of multiple databases.  ?Critical care time - 35 mins.  ? ? ? ? ? ?Subjective: ?Patient denies fevers, chills, headache, chest pain, dyspnea, nausea, vomiting, diarrhea, abdominal pain, dysuria, hematuria, hematochezia, and melena. ? ? ?Objective: ?Vitals:  ? 01/26/22 0830 01/26/22 0835 01/26/22 0900 01/26/22 0930  ?BP: (!) 144/79 (!) 144/79 138/77 (!) 151/84  ?Pulse: 76 79 75 76  ?Resp: 14 (!) 27 15 15   ?Temp:      ?TempSrc:      ?SpO2: 99% 99% 98% 98%  ?Weight:      ?Height:      ? ?No intake or output data in the 24 hours ending 01/26/22 1004 ?Weight change:  ?Exam: ? ?General:  Pt is alert, follows commands appropriately, not in acute distress ?HEENT: No icterus, No thrush, No neck mass, [place]/AT ?Cardiovascular: RRR, S1/S2, no rubs, no gallops ?Respiratory: scattered bilateral rales.  No wheeze ?Abdomen: Soft/+BS, non tender, non distended, no guarding ?Extremities: No edema, No lymphangitis, No petechiae, No rashes, no synovitis ? ? ?Data Reviewed: ?I have personally reviewed following labs and imaging studies ?Basic Metabolic Panel: ?Recent Labs  ?Lab 01/26/22 ?0432 01/26/22 ?3559  ?NA 136  --   ?K 4.1  --   ?CL 98  --   ?CO2 31  --   ?GLUCOSE 228*  --   ?BUN 33*  --   ?CREATININE 2.17*  --   ?CALCIUM 8.5*  --   ?MG  --  2.2  ?PHOS  --  4.5  ? ?Liver Function Tests: ?Recent Labs  ?Lab 01/26/22 ?7416  ?AST 24  ?ALT 25  ?ALKPHOS 77  ?BILITOT 0.8  ?PROT 6.8  ?ALBUMIN 3.5  ? ?No results for input(s): LIPASE, AMYLASE in the last 168 hours. ?No results for input(s): AMMONIA in the last 168 hours. ?Coagulation Profile: ?No results for input(s): INR, PROTIME in the last 168 hours. ?CBC: ?Recent Labs  ?Lab  01/26/22 ?3845  ?WBC 9.0  ?NEUTROABS 6.6  ?HGB 10.8*  ?HCT 35.5*  ?MCV 95.9  ?PLT 212  ? ?Cardiac Enzymes: ?No results for input(s): CKTOTAL, CKMB, CKMBINDEX, TROPONINI in the last 168 hours. ?BNP: ?Invalid input(s): POCBNP ?CBG: ?Recent Labs  ?Lab 01/26/22 ?3646  ?GLUCAP 201*  ? ?HbA1C: ?No results for input(s): HGBA1C in the last 72 hours. ?Urine analysis: ?   ?Component Value Date/Time  ? COLORURINE YELLOW 01/08/2022 2330  ? APPEARANCEUR CLEAR 01/08/2022 2330  ? LABSPEC 1.021 01/08/2022 2330  ? PHURINE 5.0 01/08/2022 2330  ? GLUCOSEU 150 (A) 01/08/2022 2330  ? [place] NEGATIVE 01/08/2022 2330  ? [place] NEGATIVE 01/08/2022 2330  ? Benjamin Stain NEGATIVE 01/08/2022 2330  ? PROTEINUR 100 (A) 01/08/2022 2330  ? NITRITE NEGATIVE 01/08/2022 2330  ? LEUKOCYTESUR NEGATIVE 01/08/2022 2330  ? ?Sepsis Labs: ?@LABRCNTIP (procalcitonin:4,lacticidven:4) ?)No results found for this or any previous visit (from the past 240 hour(s)).  ? ?Scheduled Meds: ? azithromycin  500 mg Oral Daily  ? Followed by  ? [START ON 01/27/2022] azithromycin  250 mg Oral Daily  ? budesonide (PULMICORT) nebulizer solution  0.5 mg Nebulization BID  ? furosemide  40 mg Intravenous Q12H  ? insulin aspart  0-5 Units Subcutaneous QHS  ? insulin aspart  0-9 Units Subcutaneous TID WC  ? insulin glargine-yfgn  10 Units Subcutaneous QHS  ? ipratropium-albuterol  3 mL Nebulization Q6H  ? methylPREDNISolone (SOLU-MEDROL) injection  40 mg Intravenous Q12H  ? ?Continuous Infusions: ? ?Procedures/Studies: ?DG Chest Portable 1 View ? ?Result Date: 01/26/2022 ?CLINICAL DATA:  Dyspnea. EXAM: PORTABLE CHEST 1 VIEW COMPARISON:  01/08/2022 FINDINGS: Diffuse interstitial coarsening with cephalized blood flow and Kerley lines. Small right pleural effusion. Cardiomegaly. IMPRESSION: CHF superimposed on COPD. Electronically Signed   By: Jorje Guild M.D.   On: 01/26/2022 05:29  ? ?DG Chest Port 1 View ? ?Result Date: 01/08/2022 ?CLINICAL DATA:  sob EXAM: PORTABLE CHEST 1 VIEW  COMPARISON:  Chest x-ray 12/24/2021. CT chest 12/24/2021 FINDINGS: The heart and mediastinal contours are unchanged. Aortic calcification. Coronary artery stent. No focal consolidation. Chronic coarsened intra markings with superimp

## 2022-01-26 NOTE — Assessment & Plan Note (Signed)
Continue statin. 

## 2022-01-26 NOTE — Progress Notes (Addendum)
Spoke with Dr. Carles Collet via secure chat regarding patient's code status. Patient expressed to this RN he would want CPR in the event of cardiac arrest. Awaiting new orders.  ? ?Edit at 1329 hrs: MD has confirm code status with patient. Patient remains DNR.  ?

## 2022-01-26 NOTE — Assessment & Plan Note (Addendum)
Secondary to COPD exacerbation and CHF exacerbation ?At baseline, patient is on 3 L nasal cannula ?Weaned off BiPAP>>4L ?Wean oxygen for saturation greater 90% ?Intermittently requiring BiPAP throughout the day and at hs ?Little functional reserve ?--noncompliant with CPAP at home ?Appreciate PCCM consult>>use BiPAP for increase WOB prn and wean ?4/11 High Res CT chest>>Persistent bilateral bronchial wall thickening and interlobularseptal thickening, likely due to pulmonary edema. ?

## 2022-01-26 NOTE — ED Notes (Signed)
RT at bedside placing pt on bi-pap and administering neb tx ?

## 2022-01-26 NOTE — Consult Note (Addendum)
?Cardiology Consultation:  ? ?Patient ID: John [name] ?MRN: [id number]; DOB: Sep 29, 1941 ? ?Admit date: 01/26/2022 ?Date of Consult: 01/26/2022 ? ?PCP:  John Margarita, DO ?  ?[facility] ?Cardiologist:  John Ruths, MD      ? ? ?Patient Profile:  ? ?John [name] is a 81 y.o. male with a hx of CAD (s/p multiple stents with most recent cath in 2013 showing EF of 40% with DESx2 to LAD, NSTEMI in 12/2021 with EF of 35% and medical management recommended given his AKI), HFrEF (EF 40-45% in 08/2021, at 35% in 12/2021), paroxysmal atrial fibrillation, Type 2 DM, HTN, HLD, PAD (s/p right fem-pop bypass at [facility] in 05/2018), Stage 3-4 CKD and prior CVA who is being seen 01/26/2022 for the evaluation of CHF at the request of Dr. Carles [name]. ? ?History of Present Illness:  ? ?John [name] has experienced multiple hospitalizations over the past few months. He was initially admitted for an NSTEMI in 12/2021 at [facility] Troponin values peaked at greater than 24,000 but a cardiac catheterization was not pursued as his creatinine peaked at 3.79. Medical management was recommended of his cardiomyopathy given his EF was reduced at 35%. He was again admitted to [facility] later that month for a recurrent CHF exacerbation and responded well with IV Lasix and was discharged on Torsemide 20 mg daily along with continuing Plavix, Eliquis, Coreg, Hydralazine, Isordil and Crestor. A discharge weight was not recorded but his creatinine had improved to 2.28 at the time of discharge. ? ?He did follow-up with Dr. Stanford [name] on 01/23/2022 and reported dyspnea on exertion but denied any orthopnea, PND, chest pain or pitting edema. Options were reviewed in regards to medical therapy versus cardiac catheterization and given his high risk of contrast-induced nephropathy, conservative measures were recommended and he was continued on medical therapy for his CAD. ? ?He presented to [facility] ED during the early morning hours of 01/26/2022  for evaluation of worsening dyspnea. Oxygen saturation was initially at 65% on room air. In talking with the patient and his daughter today, most history is provided by his daughter as he is currently on BiPAP. She reports he had overall been doing well at home until yesterday when he started to experience more shortness of breath. He did work with OT and felt okay with this but developed worsening dyspnea throughout the day and significant shortness of breath at night. He did not report any chest pain or palpitations. They have been tracking his weights daily and this has only increased by 1 pound. No recent lower extremity edema or abdominal distention. ? ?Initial labs showed WBC 9.0, Hgb 10.8, platelets 212, Na+ 136, K+ 4.1 and creatinine 2.17. AST 24 and ALT 25. Initial and repeat Hs Troponin flat at 40 and 39. BNP not yet ordered but will add-on to AM labs. 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 EKG showed a computer-generated read of atrial fibrillation but appears most consistent with normal sinus rhythm with frequent PVCs and baseline artifact. ? ?He has been admitted for further management of acute on chronic hypoxic respiratory failure in the setting of a CHF and COPD exacerbation.  He was started on IV Lasix 40 mg twice daily along with scheduled nebulizers, Azithromycin and IV steroids. ? ? ?Past Medical History:  ?Diagnosis Date  ? Anxiety   ? 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 BPH (benign prostatic hyperplasia)   ? CAD (coronary artery disease)   ? Cholesteatoma of attic   ? Depression   ?  Diabetes mellitus ([place])   ? Ear disease   ? GERD (gastroesophageal reflux disease)   ? Hyperlipidemia   ? Hypertension   ? PVD (peripheral vascular disease) ([place])   ? Stroke [facility])   ? ? ?Past Surgical History:  ?Procedure Laterality Date  ? BONE ANCHORED HEARING AID IMPLANT Left 06/25/2013  ? Procedure: BONE ANCHORED HEARING AID (BAHA) IMPLANT;  Surgeon: John Knee, MD;  Location: [place];  Service: ENT;  Laterality:  Left;  ? CAROTID STENT INSERTION Left   ? COCHLEAR IMPLANT    ? CORONARY STENT PLACEMENT    ? MASS EXCISION Left 06/25/2013  ? Procedure: EXCISION LEFT TEMPORAL MASS;  Surgeon: John Knee, MD;  Location: [place];  Service: ENT;  Laterality: Left;  ? TONSILLECTOMY    ?  ? ?Home Medications:  ?Prior to Admission medications   ?Medication Sig Start Date End Date Taking? Authorizing Provider  ?albuterol (VENTOLIN HFA) 108 (90 Base) MCG/ACT inhaler Inhale 2 puffs into the lungs every 6 (six) hours as needed for wheezing or shortness of breath. 12/20/20  Yes [name], [place], MD  ?allopurinol (ZYLOPRIM) 300 MG tablet Take 300 mg by mouth daily. 06/21/12  Yes [provider]  ?ALPRAZolam (XANAX) 0.25 MG tablet Take 1 tablet (0.25 mg total) by mouth 2 (two) times daily as needed for anxiety. 01/23/22  Yes John Perla, MD  ?apixaban (ELIQUIS) 2.5 MG TABS tablet Take 1 tablet (2.5 mg total) by mouth 2 (two) times daily. 12/29/21  Yes John Ishikawa, MD  ?carvedilol (COREG) 12.5 MG tablet Take 1 tablet (12.5 mg total) by mouth 2 (two) times daily with a meal. 09/18/21  Yes [name], John Bors, MD  ?cholecalciferol (VITAMIN D3) 25 MCG (1000 UNIT) tablet Take 1,000 Units by mouth daily.   Yes [provider]  ?clopidogrel (PLAVIX) 75 MG tablet Take 1 tablet (75 mg total) by mouth daily. 12/29/21  Yes John Ishikawa, MD  ?Cyanocobalamin (VITAMIN B 12 PO) Take 1 tablet by mouth daily.   Yes [provider]  ?folic acid (FOLVITE) 1 MG tablet Take 1 tablet (1 mg total) by mouth daily. 12/29/21  Yes John Ishikawa, MD  ?gabapentin (NEURONTIN) 300 MG capsule Take 1 capsule (300 mg total) by mouth at bedtime. 12/29/21  Yes John Ishikawa, MD  ?hydrALAZINE (APRESOLINE) 25 MG tablet Take 1.5 tablets (37.5 mg total) by mouth 2 (two) times daily. 12/29/21  Yes John Ishikawa, MD  ?HYDROcodone-acetaminophen (NORCO/VICODIN) 5-325 MG tablet Take 1 tablet by mouth every 4  (four) hours as needed for moderate pain. 12/29/21 12/29/22 Yes John Ishikawa, MD  ?insulin glargine (LANTUS SOLOSTAR) 100 UNIT/ML Solostar Pen Inject 50 Units into the skin daily. Dose per sliding scale. 12/29/21  Yes John Ishikawa, MD  ?isosorbide dinitrate (ISORDIL) 20 MG tablet Take 1 tablet (20 mg total) by mouth 2 (two) times daily. 12/29/21  Yes John Ishikawa, MD  ?Multiple Vitamins-Minerals (CERTAVITE/ANTIOXIDANTS) TABS Take 1 tablet by mouth daily. 12/29/21  Yes John Ishikawa, MD  ?pantoprazole (PROTONIX) 40 MG tablet Take 40 mg by mouth daily with breakfast. 08/19/14  Yes [provider]  ?rosuvastatin (CRESTOR) 10 MG tablet Take 1 tablet (10 mg total) by mouth every evening. 12/29/21  Yes John Ishikawa, MD  ?senna (SENOKOT) 8.6 MG tablet Take 2-3 tablets by mouth daily as needed for constipation.   Yes [provider]  ?sertraline (ZOLOFT) 25 MG tablet Take 1  tablet (25 mg total) by mouth every evening. 01/09/21  Yes Thayer Headings, PMHNP  ?tamsulosin (FLOMAX) 0.4 MG CAPS capsule Take 0.4 mg by mouth daily. 09/22/14  Yes [provider]  ?thiamine 100 MG tablet Take 1 tablet (100 mg total) by mouth daily. 12/30/21  Yes John Ishikawa, MD  ?torsemide (DEMADEX) 20 MG tablet Take 1 tablet (20 mg total) by mouth daily. 01/11/22  Yes Raiford Noble Inwood, DO  ?Insulin Pen Needle 32G X 4 MM MISC Use to inject insulin up to 4 times daily. 12/29/21   John Ishikawa, MD  ? ? ?Inpatient Medications: ?Scheduled Meds: ? azithromycin  500 mg Oral Daily  ? Followed by  ? [START ON 01/27/2022] azithromycin  250 mg Oral Daily  ? furosemide  40 mg Intravenous Q12H  ? insulin aspart  0-5 Units Subcutaneous QHS  ? insulin aspart  0-9 Units Subcutaneous TID WC  ? insulin glargine-yfgn  10 Units Subcutaneous QHS  ? methylPREDNISolone (SOLU-MEDROL) injection  40 mg Intravenous Q12H  ? ?Continuous Infusions: ? ?PRN Meds: ?ipratropium-albuterol ? ?Allergies:     ?Allergies  ?Allergen Reactions  ? Brilinta [Ticagrelor] Shortness Of Breath  ? Penicillins Hives  ? Ezetimibe-Simvastatin Other (See Comments)  ?  Myalgia ?  ? ? ?Social History:   ?Social History  ? ?[employer]

## 2022-01-26 NOTE — H&P (Addendum)
?History and Physical  ? ? ?Patient: John [name] NAT:[id number] DOB: [birth year] ?DOA: 01/26/2022 ?DOS: the patient was seen and examined on 01/26/2022 ?PCP: [name], DO  ?Patient coming from: Home ? ?Chief Complaint:  ?Chief Complaint  ?Patient presents with  ? Shortness of Breath  ? ?HPI: John [name] is a 81 y.o. male with medical history significant for T2DM, CAD s/p stent placement, hypertension, combined diastolic and systolic CHF, Hyperlipidemia, GERD, BPH, depression and anxiety who presents to the emergency department via EMS due to worsening shortness of breath which increased overnight.  Patient had BiPAP on and was unable to provide history, most of the history was obtained from daughter at bedside, some of the report was obtained from the ED physician and ED medical record.  Per report, patient lives with the daughter, he complained of increasing shortness of breath yesterday, this worsened overnight and he called the daughter who activated EMS.  On arrival of EMS team, it was noted to have an O2 sat of 65% on room air, he was placed on NRB at 15 L with improved O2 sat to 100%.  Patient followed up with his cardiologist on 4/4 (Dr. [name]) and his Demadex was continued. ?He was recently admitted from 3/20 to 3/22 due to acute on chronic combined systolic and diastolic CHF ? ?ED Course: ?In the emergency department, patient was tachypneic, BP 159/83 and was placed on BiPAP with FiO2 of 100%.  Work-up in the ED showed normocytic anemia hyperglycemia, BUN/creatinine 33/2.17 (creatinine is within baseline range).  Troponin x1 -40. ?Chest x-ray showed CHF superimposed on COPD ?He was treated with breathing treatment and Solu-Medrol, IV Lasix 40 mg x 1 was given.  Hospitalist was asked to admit patient for further evaluation and management. ? ?Review of Systems: ?Review of systems as noted in the HPI. All other systems reviewed and are negative. ? ? ?Past Medical History:  ?Diagnosis Date  ? Anxiety   ?  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 BPH (benign prostatic hyperplasia)   ? CAD (coronary artery disease)   ? Cholesteatoma of attic   ? Depression   ? Diabetes mellitus ([place])   ? Ear disease   ? GERD (gastroesophageal reflux disease)   ? Hyperlipidemia   ? Hypertension   ? PVD (peripheral vascular disease) ([place])   ? Stroke [facility])   ? ?Past Surgical History:  ?Procedure Laterality Date  ? BONE ANCHORED HEARING AID IMPLANT Left 06/25/2013  ? Procedure: BONE ANCHORED HEARING AID (BAHA) IMPLANT;  Surgeon: Fannie Knee, MD;  Location: [place];  Service: ENT;  Laterality: Left;  ? CAROTID STENT INSERTION Left   ? COCHLEAR IMPLANT    ? CORONARY STENT PLACEMENT    ? MASS EXCISION Left 06/25/2013  ? Procedure: EXCISION LEFT TEMPORAL MASS;  Surgeon: Fannie Knee, MD;  Location: [place];  Service: ENT;  Laterality: Left;  ? TONSILLECTOMY    ? ? ?Social History:  reports that he quit smoking about 32 years ago. His smoking use included cigarettes. He has never used smokeless tobacco. He reports that he does not currently use alcohol. He reports that he does not use drugs. ? ? ?Allergies  ?Allergen Reactions  ? Brilinta [Ticagrelor] Shortness Of Breath  ? Penicillins Hives  ? Ezetimibe-Simvastatin Other (See Comments)  ?  Myalgia ?  ? ? ?Family History  ?Problem Relation Age of Onset  ? Diabetes Other   ? Diabetes Mother   ? Diabetes Maternal Aunt   ?  Depression Other   ? Depression Other   ? Drug abuse Daughter   ? Diabetes Daughter   ?  ? ? ?Prior to Admission medications   ?Medication Sig Start Date End Date Taking? Authorizing Provider  ?albuterol (VENTOLIN HFA) 108 (90 Base) MCG/ACT inhaler Inhale 2 puffs into the lungs every 6 (six) hours as needed for wheezing or shortness of breath. 12/20/20   Pokhrel, Corrie Mckusick, MD  ?allopurinol (ZYLOPRIM) 300 MG tablet Take 300 mg by mouth daily. 06/21/12   [provider]  ?ALPRAZolam Duanne Moron) 0.25 MG tablet Take 1 tablet (0.25 mg total) by mouth 2 (two) times daily  as needed for anxiety. 01/23/22   Lelon Perla, MD  ?apixaban (ELIQUIS) 2.5 MG TABS tablet Take 1 tablet (2.5 mg total) by mouth 2 (two) times daily. 12/29/21   Little Ishikawa, MD  ?carvedilol (COREG) 12.5 MG tablet Take 1 tablet (12.5 mg total) by mouth 2 (two) times daily with a meal. 09/18/21   Lelon Perla, MD  ?cholecalciferol (VITAMIN D3) 25 MCG (1000 UNIT) tablet Take 1,000 Units by mouth daily.    [provider]  ?clopidogrel (PLAVIX) 75 MG tablet Take 1 tablet (75 mg total) by mouth daily. 12/29/21   Little Ishikawa, MD  ?Cyanocobalamin (VITAMIN B 12 PO) Take 1 tablet by mouth daily.    [provider]  ?folic acid (FOLVITE) 1 MG tablet Take 1 tablet (1 mg total) by mouth daily. 12/29/21   Little Ishikawa, MD  ?gabapentin (NEURONTIN) 300 MG capsule Take 1 capsule (300 mg total) by mouth at bedtime. 12/29/21   Little Ishikawa, MD  ?hydrALAZINE (APRESOLINE) 25 MG tablet Take 1.5 tablets (37.5 mg total) by mouth 2 (two) times daily. 12/29/21   Little Ishikawa, MD  ?HYDROcodone-acetaminophen (NORCO/VICODIN) 5-325 MG tablet Take 1 tablet by mouth every 4 (four) hours as needed for moderate pain. 12/29/21 12/29/22  Little Ishikawa, MD  ?insulin glargine (LANTUS SOLOSTAR) 100 UNIT/ML Solostar Pen Inject 50 Units into the skin daily. Dose per sliding scale. 12/29/21   Little Ishikawa, MD  ?Insulin Pen Needle 32G X 4 MM MISC Use to inject insulin up to 4 times daily. 12/29/21   Little Ishikawa, MD  ?isosorbide dinitrate (ISORDIL) 20 MG tablet Take 1 tablet (20 mg total) by mouth 2 (two) times daily. 12/29/21   Little Ishikawa, MD  ?Multiple Vitamins-Minerals (CERTAVITE/ANTIOXIDANTS) TABS Take 1 tablet by mouth daily. 12/29/21   Little Ishikawa, MD  ?pantoprazole (PROTONIX) 40 MG tablet Take 40 mg by mouth daily with breakfast. 08/19/14   [provider]  ?rosuvastatin (CRESTOR) 10 MG tablet Take 1 tablet (10 mg total) by mouth every evening.  12/29/21   Little Ishikawa, MD  ?senna (SENOKOT) 8.6 MG tablet Take 2-3 tablets by mouth daily as needed for constipation.    [provider]  ?sertraline (ZOLOFT) 25 MG tablet Take 1 tablet (25 mg total) by mouth every evening. 01/09/21   Thayer Headings, PMHNP  ?tamsulosin (FLOMAX) 0.4 MG CAPS capsule Take 0.4 mg by mouth daily. 09/22/14   [provider]  ?thiamine 100 MG tablet Take 1 tablet (100 mg total) by mouth daily. 12/30/21   Little Ishikawa, MD  ?torsemide (DEMADEX) 20 MG tablet Take 1 tablet (20 mg total) by mouth daily. 01/11/22   Kerney Elbe, DO  ? ? ?Physical Exam: ?BP 131/69   Pulse 78   Temp 98.4 ?F (36.9 ?C) (Axillary)  Resp 16   Ht 5\' 6"  (1.676 m)   Wt 87.8 kg   SpO2 100%   BMI 31.25 kg/m?  ? ?General: 81 y.o. year-old male well developed well nourished in no acute distress.  Alert and oriented x3. ?HEENT: NCAT, EOMI ?Neck: Supple, trachea medial ?Cardiovascular: Regular rate and rhythm with no rubs or gallops.  No thyromegaly or JVD noted.  No lower extremity edema. 2/4 pulses in all 4 extremities. ?Respiratory: Rales in RLL on  auscultation with mild scattered wheezes.  ?Abdomen: Soft, nontender nondistended with normal bowel sounds x4 quadrants. ?Muskuloskeletal: No cyanosis, clubbing or edema noted bilaterally ?Neuro: CN II-XII intact, strength 5/5 x 4, sensation, reflexes intact ?Skin: No ulcerative lesions noted or rashes ?Psychiatry: Judgement and insight appear normal. Mood is appropriate for condition and setting ?   ?   ?   ?Labs on Admission:  ?Basic Metabolic Panel: ?Recent Labs  ?Lab 01/26/22 ?0432 01/26/22 ?2248  ?NA 136  --   ?K 4.1  --   ?CL 98  --   ?CO2 31  --   ?GLUCOSE 228*  --   ?BUN 33*  --   ?CREATININE 2.17*  --   ?CALCIUM 8.5*  --   ?MG  --  2.2  ?PHOS  --  4.5  ? ?Liver Function Tests: ?Recent Labs  ?Lab 01/26/22 ?2500  ?AST 24  ?ALT 25  ?ALKPHOS 77  ?BILITOT 0.8  ?PROT 6.8  ?ALBUMIN 3.5  ? ?No results for input(s): LIPASE, AMYLASE  in the last 168 hours. ?No results for input(s): AMMONIA in the last 168 hours. ?CBC: ?Recent Labs  ?Lab 01/26/22 ?3704  ?WBC 9.0  ?NEUTROABS 6.6  ?HGB 10.8*  ?HCT 35.5*  ?MCV 95.9  ?PLT 212  ? ?Cardiac Enzym

## 2022-01-26 NOTE — Assessment & Plan Note (Addendum)
Continue Pulmicort ?Continue brovana ?Continue DuoNebs ?Continue IV Solu-Medrol>>decreased to once daily ?Intermittently requiring BiPAP during day and at hs ?Appreciate PCCM ?

## 2022-01-26 NOTE — Assessment & Plan Note (Signed)
-   He is s/p right fem-pop bypass at [facility] in 05/2018. Remains on Plavix and statin therapy.  ?

## 2022-01-26 NOTE — Assessment & Plan Note (Addendum)
No chest pain presently ?Personally reviewed EKG--sinus, LBBB, nonspecific STT ?Troponins are flat 39>>40>>36>>39 ?Continue Plavix, Crestor, carvedilol ?

## 2022-01-26 NOTE — Assessment & Plan Note (Addendum)
Continue  Isordil ?Coreg changed to metoprolol ?

## 2022-01-26 NOTE — Assessment & Plan Note (Addendum)
12/24/2021 hemoglobin A1c 7.1 ?NovoLog sliding scale ?increase dose Semglee to 28 units ?Increase novolog 7 units with meals ? ?

## 2022-01-26 NOTE — ED Provider Notes (Signed)
?[facility] ?Provider Note ? ?CSN: [id number] ?Arrival date & time: 01/26/22 0420 ? ?Chief Complaint(s) ?Shortness of Breath ? ?HPI ?John [name] is a 81 y.o. male with PMH BPH, CAD, CHF, T2DM, HLD, HTN, COPD who presents emergency department for of shortness of breath.  Patient discharged from this hospital on 01/10/2022 for CHF/COPD exacerbation and has had a slow decline since discharge.  Patient with significant respiratory distress this morning and EMS was called who found the patient to be saturating in the 50s on room air.  Patient arrives with significant respiratory muscle use, expiratory wheezing on exam.  Patient arrives on a nonrebreather saturating 100%.  Patient altered on arrival. ? ? ?Shortness of Breath ? ?Past Medical History ?Past Medical History:  ?Diagnosis Date  ? Anxiety   ? 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 BPH (benign prostatic hyperplasia)   ? CAD (coronary artery disease)   ? Cholesteatoma of attic   ? Depression   ? Diabetes mellitus ([place])   ? Ear disease   ? GERD (gastroesophageal reflux disease)   ? Hyperlipidemia   ? Hypertension   ? PVD (peripheral vascular disease) ([place])   ? Stroke [facility])   ? ?Patient Active Problem List  ? Diagnosis Date Noted  ? COPD with acute exacerbation ([place]) 01/26/2022  ? Diabetes mellitus, type 2 ([place]) 01/09/2022  ? Acute on chronic respiratory failure with hypoxia ([place]) 01/08/2022  ? Hypoxia 12/24/2021  ? D-dimer, elevated 12/24/2021  ?  Class: Acute  ? NSTEMI (non-ST elevated myocardial infarction) ([place]) 12/24/2021  ? Acute on chronic combined systolic and diastolic CHF (congestive heart failure) ([place]) 09/13/2021  ? Acute respiratory failure with hypoxia and hypercapnia ([place]) 09/13/2021  ? Leukocytosis 09/13/2021  ? Elevated troponin 09/13/2021  ? Obstructive sleep apnea 09/13/2021  ? BPH (benign prostatic hyperplasia) 09/13/2021  ? CKD (chronic kidney disease), stage III ([place]) 09/13/2021  ? Diabetic neuropathy ([place]) 09/13/2021  ? Chest pain 12/20/2020   ? Acute diastolic CHF (congestive heart failure) ([place]) 12/19/2020  ? Essential hypertension 10/03/2012  ? CAD (coronary artery disease) of artery bypass graft 06/26/2012  ? Cardiomyopathy, 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  ? Essential hypertension, malignant 06/26/2012  ? Mixed hyperlipidemia 06/26/2012  ? Peripheral vascular disease ([place]) 06/26/2012  ? Coronary atherosclerosis of native coronary artery 06/26/2012  ? ?Home Medication(s) ?Prior to Admission medications   ?Medication Sig Start Date End Date Taking? Authorizing Provider  ?albuterol (VENTOLIN HFA) 108 (90 Base) MCG/ACT inhaler Inhale 2 puffs into the lungs every 6 (six) hours as needed for wheezing or shortness of breath. 12/20/20   Pokhrel, Corrie Mckusick, MD  ?allopurinol (ZYLOPRIM) 300 MG tablet Take 300 mg by mouth daily. 06/21/12   [provider]  ?ALPRAZolam Duanne Moron) 0.25 MG tablet Take 1 tablet (0.25 mg total) by mouth 2 (two) times daily as needed for anxiety. 01/23/22   Lelon Perla, MD  ?apixaban (ELIQUIS) 2.5 MG TABS tablet Take 1 tablet (2.5 mg total) by mouth 2 (two) times daily. 12/29/21   Little Ishikawa, MD  ?carvedilol (COREG) 12.5 MG tablet Take 1 tablet (12.5 mg total) by mouth 2 (two) times daily with a meal. 09/18/21   Lelon Perla, MD  ?cholecalciferol (VITAMIN D3) 25 MCG (1000 UNIT) tablet Take 1,000 Units by mouth daily.    [provider]  ?clopidogrel (PLAVIX) 75 MG tablet Take 1 tablet (75 mg total) by mouth daily. 12/29/21   Little Ishikawa, MD  ?Cyanocobalamin (VITAMIN B 12 PO) Take 1 tablet by mouth daily.  [provider]  ?folic acid (FOLVITE) 1 MG tablet Take 1 tablet (1 mg total) by mouth daily. 12/29/21   Little Ishikawa, MD  ?gabapentin (NEURONTIN) 300 MG capsule Take 1 capsule (300 mg total) by mouth at bedtime. 12/29/21   Little Ishikawa, MD  ?hydrALAZINE (APRESOLINE) 25 MG tablet Take 1.5 tablets (37.5 mg total) by mouth 2 (two) times daily. 12/29/21   Little Ishikawa, MD   ?HYDROcodone-acetaminophen (NORCO/VICODIN) 5-325 MG tablet Take 1 tablet by mouth every 4 (four) hours as needed for moderate pain. 12/29/21 12/29/22  Little Ishikawa, MD  ?insulin glargine (LANTUS SOLOSTAR) 100 UNIT/ML Solostar Pen Inject 50 Units into the skin daily. Dose per sliding scale. 12/29/21   Little Ishikawa, MD  ?Insulin Pen Needle 32G X 4 MM MISC Use to inject insulin up to 4 times daily. 12/29/21   Little Ishikawa, MD  ?isosorbide dinitrate (ISORDIL) 20 MG tablet Take 1 tablet (20 mg total) by mouth 2 (two) times daily. 12/29/21   Little Ishikawa, MD  ?Multiple Vitamins-Minerals (CERTAVITE/ANTIOXIDANTS) TABS Take 1 tablet by mouth daily. 12/29/21   Little Ishikawa, MD  ?pantoprazole (PROTONIX) 40 MG tablet Take 40 mg by mouth daily with breakfast. 08/19/14   [provider]  ?rosuvastatin (CRESTOR) 10 MG tablet Take 1 tablet (10 mg total) by mouth every evening. 12/29/21   Little Ishikawa, MD  ?senna (SENOKOT) 8.6 MG tablet Take 2-3 tablets by mouth daily as needed for constipation.    [provider]  ?sertraline (ZOLOFT) 25 MG tablet Take 1 tablet (25 mg total) by mouth every evening. 01/09/21   Thayer Headings, PMHNP  ?tamsulosin (FLOMAX) 0.4 MG CAPS capsule Take 0.4 mg by mouth daily. 09/22/14   [provider]  ?thiamine 100 MG tablet Take 1 tablet (100 mg total) by mouth daily. 12/30/21   Little Ishikawa, MD  ?torsemide (DEMADEX) 20 MG tablet Take 1 tablet (20 mg total) by mouth daily. 01/11/22   Kerney Elbe, DO  ?                                                                                                                                  ?Past Surgical History ?Past Surgical History:  ?Procedure Laterality Date  ? BONE ANCHORED HEARING AID IMPLANT Left 06/25/2013  ? Procedure: BONE ANCHORED HEARING AID (BAHA) IMPLANT;  Surgeon: Fannie Knee, MD;  Location: [place];  Service: ENT;  Laterality: Left;  ? CAROTID STENT  INSERTION Left   ? COCHLEAR IMPLANT    ? CORONARY STENT PLACEMENT    ? MASS EXCISION Left 06/25/2013  ? Procedure: EXCISION LEFT TEMPORAL MASS;  Surgeon: Fannie Knee, MD;  Location: [place];  Service: ENT;  Laterality: Left;  ? TONSILLECTOMY    ? ?Family History ?Family History  ?Problem Relation Age of Onset  ? Diabetes Other   ?  Diabetes Mother   ? Diabetes Maternal Aunt   ? Depression Other   ? Depression Other   ? Drug abuse Daughter   ? Diabetes Daughter   ? ? ?Social History ?Social History  ? ?Tobacco Use  ? Smoking status: Former  ?  Types: Cigarettes  ?  Quit date: 05/19/1989  ?  Years since quitting: 32.7  ? Smokeless tobacco: Never  ?Substance Use Topics  ? Alcohol use: Not Currently  ? Drug use: No  ? ?Allergies ?Brilinta [ticagrelor], Penicillins, and Ezetimibe-simvastatin ? ?Review of Systems ?Review of Systems  ?Unable to perform ROS: Mental status change  ?Respiratory:  Positive for shortness of breath.   ? ?Physical Exam ?Vital Signs  ?I have reviewed the triage vital signs ?BP (!) 159/83   Pulse 81   Temp 98.4 ?F (36.9 ?C) (Axillary)   Resp 17   Ht 5\' 6"  (1.676 m)   Wt 87.8 kg   SpO2 100%   BMI 31.25 kg/m?  ? ?Physical Exam ?Vitals and nursing note reviewed.  ?Constitutional:   ?   General: He is in acute distress.  ?   Appearance: He is well-developed. He is ill-appearing.  ?HENT:  ?   Head: Normocephalic and atraumatic.  ?Eyes:  ?   Conjunctiva/sclera: Conjunctivae normal.  ?Cardiovascular:  ?   Rate and Rhythm: Normal rate and regular rhythm.  ?   Heart sounds: No murmur heard. ?Pulmonary:  ?   Effort: Tachypnea, accessory muscle usage and respiratory distress present.  ?   Breath sounds: Wheezing present.  ?Abdominal:  ?   Palpations: Abdomen is soft.  ?   Tenderness: There is no abdominal tenderness.  ?Musculoskeletal:     ?   General: No swelling.  ?   Cervical back: Neck supple.  ?Skin: ?   General: Skin is warm and dry.  ?   Capillary Refill: Capillary refill takes  less than 2 seconds.  ?Neurological:  ?   Mental Status: He is alert. He is disoriented.  ?Psychiatric:     ?   Mood and Affect: Mood normal.  ? ? ?ED Results and Treatments ?Labs ?(all labs ordered are listed, but only a

## 2022-01-26 NOTE — Assessment & Plan Note (Signed)
-  Continue tamulosin 

## 2022-01-26 NOTE — Plan of Care (Signed)
Patient admitted to SDU today. Off BiPAP for now; no respiratory distress. ECHO today. ? ? ?Problem: Education: ?Goal: Knowledge of General Education information will improve ?Description: Including pain rating scale, medication(s)/side effects and non-pharmacologic comfort measures ?Outcome: Progressing ?  ?Problem: Health Behavior/Discharge Planning: ?Goal: Ability to manage health-related needs will improve ?Outcome: Progressing ?  ?Problem: Clinical Measurements: ?Goal: Ability to maintain clinical measurements within normal limits will improve ?Outcome: Progressing ?Goal: Will remain free from infection ?Outcome: Progressing ?Goal: Diagnostic test results will improve ?Outcome: Progressing ?Goal: Respiratory complications will improve ?Outcome: Progressing ?Goal: Cardiovascular complication will be avoided ?Outcome: Progressing ?  ?Problem: Activity: ?Goal: Risk for activity intolerance will decrease ?Outcome: Progressing ?  ?Problem: Nutrition: ?Goal: Adequate nutrition will be maintained ?Outcome: Progressing ?  ?Problem: Coping: ?Goal: Level of anxiety will decrease ?Outcome: Progressing ?  ?Problem: Elimination: ?Goal: Will not experience complications related to bowel motility ?Outcome: Progressing ?Goal: Will not experience complications related to urinary retention ?Outcome: Progressing ?  ?Problem: Pain Managment: ?Goal: General experience of comfort will improve ?Outcome: Progressing ?  ?Problem: Safety: ?Goal: Ability to remain free from injury will improve ?Outcome: Progressing ?  ?Problem: Skin Integrity: ?Goal: Risk for impaired skin integrity will decrease ?Outcome: Progressing ?  ?

## 2022-01-26 NOTE — Progress Notes (Signed)
*  PRELIMINARY RESULTS* ?EchocardiogramLimited ?Limited 2D Echocardiogram has been performed. ? ?John [name] ?01/26/2022, 1:58 PM ?

## 2022-01-26 NOTE — Progress Notes (Signed)
Pt O2 decreased from 100% to 75% due to patient spo2 100%, pt resting comfortably. ?

## 2022-01-26 NOTE — Assessment & Plan Note (Signed)
Baseline creatinine 2.0-2.3 ?Monitor with diuresis ?

## 2022-01-27 DIAGNOSIS — I480 Paroxysmal atrial fibrillation: Secondary | ICD-10-CM

## 2022-01-27 DIAGNOSIS — J9621 Acute and chronic respiratory failure with hypoxia: Secondary | ICD-10-CM | POA: Diagnosis not present

## 2022-01-27 DIAGNOSIS — R778 Other specified abnormalities of plasma proteins: Secondary | ICD-10-CM | POA: Diagnosis not present

## 2022-01-27 DIAGNOSIS — J441 Chronic obstructive pulmonary disease with (acute) exacerbation: Secondary | ICD-10-CM | POA: Diagnosis not present

## 2022-01-27 DIAGNOSIS — I2510 Atherosclerotic heart disease of native coronary artery without angina pectoris: Secondary | ICD-10-CM | POA: Diagnosis not present

## 2022-01-27 LAB — BASIC METABOLIC PANEL
Anion gap: 9 (ref 5–15)
BUN: 45 mg/dL — ABNORMAL HIGH (ref 8–23)
CO2: 29 mmol/L (ref 22–32)
Calcium: 8.4 mg/dL — ABNORMAL LOW (ref 8.9–10.3)
Chloride: 98 mmol/L (ref 98–111)
Creatinine, Ser: 2.36 mg/dL — ABNORMAL HIGH (ref 0.61–1.24)
GFR, Estimated: 27 mL/min — ABNORMAL LOW (ref >60)
Glucose, Bld: 302 mg/dL — ABNORMAL HIGH (ref 70–99)
Potassium: 4.1 mmol/L (ref 3.5–5.1)
Sodium: 136 mmol/L (ref 135–145)

## 2022-01-27 LAB — MAGNESIUM
Magnesium: 2.1 mg/dL (ref 1.7–2.4)
Magnesium: 2 mg/dL (ref 1.7–2.4)

## 2022-01-27 LAB — CBC
HCT: 28.3 % — ABNORMAL LOW (ref 39.0–52.0)
Hemoglobin: 8.7 g/dL — ABNORMAL LOW (ref 13.0–17.0)
MCH: 28.6 pg (ref 26.0–34.0)
MCHC: 30.7 g/dL (ref 30.0–36.0)
MCV: 93.1 fL (ref 80.0–100.0)
Platelets: 165 10*3/uL (ref 150–400)
RBC: 3.04 MIL/uL — ABNORMAL LOW (ref 4.22–5.81)
RDW: 15.6 % — ABNORMAL HIGH (ref 11.5–15.5)
WBC: 6.5 10*3/uL (ref 4.0–10.5)
nRBC: 0 % (ref 0.0–0.2)

## 2022-01-27 LAB — COMPREHENSIVE METABOLIC PANEL
ALT: 20 U/L (ref 0–44)
AST: 19 U/L (ref 15–41)
Albumin: 3 g/dL — ABNORMAL LOW (ref 3.5–5.0)
Alkaline Phosphatase: 58 U/L (ref 38–126)
Anion gap: 8 (ref 5–15)
BUN: 44 mg/dL — ABNORMAL HIGH (ref 8–23)
CO2: 28 mmol/L (ref 22–32)
Calcium: 8.3 mg/dL — ABNORMAL LOW (ref 8.9–10.3)
Chloride: 99 mmol/L (ref 98–111)
Creatinine, Ser: 2.22 mg/dL — ABNORMAL HIGH (ref 0.61–1.24)
GFR, Estimated: 29 mL/min — ABNORMAL LOW (ref >60)
Glucose, Bld: 256 mg/dL — ABNORMAL HIGH (ref 70–99)
Potassium: 3.9 mmol/L (ref 3.5–5.1)
Sodium: 135 mmol/L (ref 135–145)
Total Bilirubin: 0.6 mg/dL (ref 0.3–1.2)
Total Protein: 5.7 g/dL — ABNORMAL LOW (ref 6.5–8.1)

## 2022-01-27 LAB — GLUCOSE, CAPILLARY
Glucose-Capillary: 244 mg/dL — ABNORMAL HIGH (ref 70–99)
Glucose-Capillary: 333 mg/dL — ABNORMAL HIGH (ref 70–99)
Glucose-Capillary: 300 mg/dL — ABNORMAL HIGH (ref 70–99)
Glucose-Capillary: 168 mg/dL — ABNORMAL HIGH (ref 70–99)

## 2022-01-27 LAB — TROPONIN I (HIGH SENSITIVITY)
Troponin I (High Sensitivity): 36 ng/L — ABNORMAL HIGH (ref <18)
Troponin I (High Sensitivity): 39 ng/L — ABNORMAL HIGH (ref <18)

## 2022-01-27 LAB — APTT: aPTT: 32 seconds (ref 24–36)

## 2022-01-27 MED ORDER — ARFORMOTEROL TARTRATE 15 MCG/2ML IN NEBU
15.0000 ug | INHALATION_SOLUTION | Freq: Two times a day (BID) | RESPIRATORY_TRACT | Status: DC
  Administered 2022-01-27 – 2022-02-06 (×20): 15 ug via RESPIRATORY_TRACT
  Filled 2022-01-27 (×20): qty 2

## 2022-01-27 MED ORDER — ALUM & MAG HYDROXIDE-SIMETH 200-200-20 MG/5ML PO SUSP
30.0000 mL | Freq: Four times a day (QID) | ORAL | Status: DC | PRN
  Administered 2022-01-27: 30 mL via ORAL
  Filled 2022-01-27: qty 30

## 2022-01-27 MED ORDER — GABAPENTIN 300 MG PO CAPS
300.0000 mg | ORAL_CAPSULE | Freq: Every day | ORAL | Status: DC
  Administered 2022-01-27 – 2022-02-05 (×11): 300 mg via ORAL
  Filled 2022-01-27 (×11): qty 1

## 2022-01-27 MED ORDER — INSULIN ASPART 100 UNIT/ML IJ SOLN
4.0000 [IU] | Freq: Three times a day (TID) | INTRAMUSCULAR | Status: DC
  Administered 2022-01-27 – 2022-01-29 (×7): 4 [IU] via SUBCUTANEOUS

## 2022-01-27 NOTE — Progress Notes (Addendum)
?  ?       ?PROGRESS NOTE ? ?John [name] YQI:[id number] DOB: [birth year] DOA: 01/26/2022 ?PCP: [name], DO ? ?Brief History:  ?81 year old male with a history of recent NSTEMI 12/2021, diabetes mellitus type 2, hyperlipidemia, hypertension, paroxysmal atrial fibrillation, GERD, BPH, and CKD stage III presenting with shortness of breath and respiratory distress.  The patient presented with oxygen saturation 65% on room air.  He was initially placed on nonrebreather.  He was subsequently placed on BiPAP in the emergency department. ?He was recently admitted to the hospital from 01/08/2022 to 01/10/2022 for acute on chronic systolic and diastolic CHF.  He was discharged home with torsemide 20 mg daily.  He has been compliant with his medications and fluid restriction.  In fact, he has had decreased oral intake.  He has only gained 1 pound since discharge from the hospital.  His last weight was 194 pounds on the day of admission.  In addition, the patient also had a hospital admission from 12/24/2021 to 12/29/2021 for which he was treated medically for NSTEMI.  His troponin peaked >24K.  he was discharged home on Isordil, hydralazine, carvedilol, Crestor, and Plavix. ?Since discharge home from the hospital on 01/11/2022, the patient had remained short of breath with exertion.  His daughter states that he has essentially gradually declined since discharge.  After participating with occupational therapy on 01/25/2022, the patient became more short of breath as the day progressed.  He has not had any fevers, chills, headache, neck pain, chest pain, coughing, hemoptysis, nausea, vomiting or diarrhea. ?In the ED, the patient was afebrile hemodynamically stable with oxygen saturation 98-100% on BiPAP.  Troponins were flat 39>>40.  Initial labs showed WBC 9.0, Hgb 10.8, platelets 212, Na+ 136, K+ 4.1 and creatinine 2.17. AST 24 and ALT 25. Initial and repeat Hs Troponin flat at 40 and 39. BNP not yet ordered but will add-on to AM  labs. 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 EKG showed sinus rhythm with PVCs.  The patient was started on IV furosemide, bronchodilators, IV steroids, and azithromycin.  ? ? ? ?Assessment and Plan: ?* Acute on chronic respiratory failure with hypoxia and hypercapnia (HCC) ?Secondary to COPD exacerbation and CHF exacerbation ?At baseline, patient is on 3 L nasal cannula ?Weaned off BiPAP>>4L ?Wean oxygen for saturation greater 90% ? ?COPD exacerbation ([place]) ?Continue Pulmicort ?Add brovana ?Continue DuoNebs ?Continue IV Solu-Medrol ? ?Acute on chronic combined systolic and diastolic CHF (congestive heart failure) ([place]) ?3623 echo EF 35%, global HK, grade 2 DD ?Continue IV furosemide ?Daily weights ?Accurate I's and O's-=-incomplete ?Appreciate cardiology consult--NEG 2 lbs ? ?CKD (chronic kidney disease), stage IV ([place]) ?Baseline creatinine 2.0-2.3 ?Monitor with diuresis ? ?Paroxysmal atrial fibrillation (HCC) ?Continue apixaban ?Continue carvedilol ?Optimize potassium ?Currently in sinus ? ?Coronary atherosclerosis of native coronary artery ?No chest pain presently ?Personally reviewed EKG--sinus, LBBB, nonspecific STT ?Troponins are flat 39>>40>>36>>39 ?Continue Plavix, Crestor, carvedilol ? ?BPH (benign prostatic hyperplasia) ?Continue tamulosin ? ?Elevated troponin ?Trends are flat ?Personally reviewed EKG--sinus, LBBB, nonspecific STT ?Secondary to demand ischemia ?4/7 limited echo--EF 30-35%, trivial pericardial effusion, global HK ?Appreciate cardiology consult ? ?Type 2 diabetes mellitus with hyperglycemia (HCC) ?12/24/2021 hemoglobin A1c 7.1 ?NovoLog sliding scale ?Reduced dose Semglee ?Add novolog 4 units with meals ? ? ?Essential hypertension ?Continue coreg, hydralazine, isordil ? ?Peripheral vascular disease ([place]) ?- He is s/p right fem-pop bypass at [facility] in 05/2018. Remains on Plavix and statin therapy.  ? ?Mixed hyperlipidemia ?Continue statin ? ?Status is: Inpatient ?  Remains inpatient appropriate  because: severity of illness requiring BiPAP and IV lasix ?  ?  ?  ?Family Communication:   daughter updated at bedside 4/7; left VM on 4/8 ?  ?Consultants:  cardiology ?  ?Code Status:  DNR ?  ?DVT Prophylaxis: apixaban ?  ?  ?Procedures: ?As Listed in Progress Note Above ?  ?Antibiotics: ?Azthro 4/7>> ?  ? ? ? ? ? ? ? ? ? ? ? ? ?Subjective: ?Overall breathing better but had some sob after lunch.  Denies f/c, cp, n/v/d, abd pain. ? ?Objective: ?Vitals:  ? 01/27/22 0700 01/27/22 0800 01/27/22 0845 01/27/22 1124  ?BP: (!) 142/77     ?Pulse: 82 97    ?Resp: 18 19    ?Temp:    98.5 ?F (36.9 ?C)  ?TempSrc:    Oral  ?SpO2: 94% (!) 89% 90%   ?Weight:      ?Height:      ? ? ?Intake/Output Summary (Last 24 hours) at 01/27/2022 1240 ?Last data filed at 01/27/2022 0800 ?Gross per 24 hour  ?Intake 938.93 ml  ?Output 225 ml  ?Net 713.93 ml  ? ?Weight change: -1.016 kg ?Exam: ? ?General:  Pt is alert, follows commands appropriately, not in acute distress ?HEENT: No icterus, No thrush, No neck mass, [place]/AT ?Cardiovascular: RRR, S1/S2, no rubs, no gallops ?Respiratory: bibasilar crackles.  No wheeze ?Abdomen: Soft/+BS, non tender, non distended, no guarding ?Extremities: No edema, No lymphangitis, No petechiae, No rashes, no synovitis ? ? ?Data Reviewed: ?I have personally reviewed following labs and imaging studies ?Basic Metabolic Panel: ?Recent Labs  ?Lab 01/26/22 ?0432 01/26/22 ?9485 01/27/22 ?4627 01/27/22 ?0350  ?NA 136  --  136 135  ?K 4.1  --  4.1 3.9  ?CL 98  --  98 99  ?CO2 31  --  29 28  ?GLUCOSE 228*  --  302* 256*  ?BUN 33*  --  45* 44*  ?CREATININE 2.17*  --  2.36* 2.22*  ?CALCIUM 8.5*  --  8.4* 8.3*  ?MG  --  2.2 2.1 2.0  ?PHOS  --  4.5  --   --   ? ?Liver Function Tests: ?Recent Labs  ?Lab 01/26/22 ?0432 01/27/22 ?0313  ?AST 24 19  ?ALT 25 20  ?ALKPHOS 77 58  ?BILITOT 0.8 0.6  ?PROT 6.8 5.7*  ?ALBUMIN 3.5 3.0*  ? ?No results for input(s): LIPASE, AMYLASE in the last 168 hours. ?No results for input(s): AMMONIA in the  last 168 hours. ?Coagulation Profile: ?No results for input(s): INR, PROTIME in the last 168 hours. ?CBC: ?Recent Labs  ?Lab 01/26/22 ?0432 01/27/22 ?0313  ?WBC 9.0 6.5  ?NEUTROABS 6.6  --   ?HGB 10.8* 8.7*  ?HCT 35.5* 28.3*  ?MCV 95.9 93.1  ?PLT 212 165  ? ?Cardiac Enzymes: ?No results for input(s): CKTOTAL, CKMB, CKMBINDEX, TROPONINI in the last 168 hours. ?BNP: ?Invalid input(s): POCBNP ?CBG: ?Recent Labs  ?Lab 01/26/22 ?1159 01/26/22 ?1618 01/26/22 ?1959 01/27/22 ?0938 01/27/22 ?1121  ?GLUCAP 204* 180* 323* 244* 333*  ? ?HbA1C: ?No results for input(s): HGBA1C in the last 72 hours. ?Urine analysis: ?   ?Component Value Date/Time  ? [place] YELLOW 01/26/2022 2100  ? APPEARANCEUR CLEAR 01/26/2022 2100  ? LABSPEC 1.011 01/26/2022 2100  ? PHURINE 5.0 01/26/2022 2100  ? GLUCOSEU 150 (A) 01/26/2022 2100  ? [place] NEGATIVE 01/26/2022 2100  ? [place] NEGATIVE 01/26/2022 2100  ? [place] NEGATIVE 01/26/2022 2100  ? [place] NEGATIVE 01/26/2022 2100  ? NITRITE NEGATIVE 01/26/2022  2100  ? [place] NEGATIVE 01/26/2022 2100  ? ?Sepsis Labs: ?@LABRCNTIP (procalcitonin:4,lacticidven:4) ?) ?Recent Results (from the past 240 hour(s))  ?Respiratory (~20 pathogens) panel by PCR     Status: None  ? Collection Time: 01/26/22 10:27 AM  ? Specimen: Nasopharyngeal Swab; Respiratory  ?Result Value Ref Range Status  ? Adenovirus NOT DETECTED NOT DETECTED Final  ? Coronavirus 229E NOT DETECTED NOT DETECTED Final  ?  Comment: (NOTE) ?The Coronavirus on the Respiratory Panel, DOES NOT test for the novel  ?Coronavirus (2019 nCoV) ?  ? Coronavirus HKU1 NOT DETECTED NOT DETECTED Final  ? Coronavirus NL63 NOT DETECTED NOT DETECTED Final  ? Coronavirus OC43 NOT DETECTED NOT DETECTED Final  ? Metapneumovirus NOT DETECTED NOT DETECTED Final  ? Rhinovirus / Enterovirus NOT DETECTED NOT DETECTED Final  ? Influenza A NOT DETECTED NOT DETECTED Final  ? Influenza B NOT DETECTED NOT DETECTED Final  ? Parainfluenza Virus 1 NOT DETECTED NOT  DETECTED Final  ? Parainfluenza Virus 2 NOT DETECTED NOT DETECTED Final  ? Parainfluenza Virus 3 NOT DETECTED NOT DETECTED Final  ? Parainfluenza Virus 4 NOT DETECTED NOT DETECTED Final  ? Respiratory Syncy

## 2022-01-27 NOTE — Progress Notes (Signed)
EKG changes noted on monitor. Patient states he does not have any chest pain or discomfort. 12 lead EKG obtained. Adefeso DO notified. Orders for BMP, Troponin, and Magnesium placed. All vitals are stable at this time. ?

## 2022-01-28 DIAGNOSIS — N184 Chronic kidney disease, stage 4 (severe): Secondary | ICD-10-CM | POA: Diagnosis not present

## 2022-01-28 DIAGNOSIS — J9621 Acute and chronic respiratory failure with hypoxia: Secondary | ICD-10-CM | POA: Diagnosis not present

## 2022-01-28 DIAGNOSIS — J441 Chronic obstructive pulmonary disease with (acute) exacerbation: Secondary | ICD-10-CM | POA: Diagnosis not present

## 2022-01-28 DIAGNOSIS — I5043 Acute on chronic combined systolic (congestive) and diastolic (congestive) heart failure: Secondary | ICD-10-CM | POA: Diagnosis not present

## 2022-01-28 LAB — GLUCOSE, CAPILLARY
Glucose-Capillary: 287 mg/dL — ABNORMAL HIGH (ref 70–99)
Glucose-Capillary: 389 mg/dL — ABNORMAL HIGH (ref 70–99)
Glucose-Capillary: 281 mg/dL — ABNORMAL HIGH (ref 70–99)
Glucose-Capillary: 275 mg/dL — ABNORMAL HIGH (ref 70–99)

## 2022-01-28 LAB — BASIC METABOLIC PANEL
Anion gap: 10 (ref 5–15)
BUN: 62 mg/dL — ABNORMAL HIGH (ref 8–23)
CO2: 30 mmol/L (ref 22–32)
Calcium: 8.6 mg/dL — ABNORMAL LOW (ref 8.9–10.3)
Chloride: 96 mmol/L — ABNORMAL LOW (ref 98–111)
Creatinine, Ser: 2.4 mg/dL — ABNORMAL HIGH (ref 0.61–1.24)
GFR, Estimated: 26 mL/min — ABNORMAL LOW (ref >60)
Glucose, Bld: 273 mg/dL — ABNORMAL HIGH (ref 70–99)
Potassium: 4.4 mmol/L (ref 3.5–5.1)
Sodium: 136 mmol/L (ref 135–145)

## 2022-01-28 LAB — MAGNESIUM: Magnesium: 2.2 mg/dL (ref 1.7–2.4)

## 2022-01-28 LAB — PROCALCITONIN: Procalcitonin: <0.1 ng/mL

## 2022-01-28 MED ORDER — INSULIN GLARGINE-YFGN 100 UNIT/ML ~~LOC~~ SOLN
20.0000 [IU] | Freq: Every day | SUBCUTANEOUS | Status: DC
  Administered 2022-01-28: 20 [IU] via SUBCUTANEOUS
  Filled 2022-01-28 (×2): qty 0.2

## 2022-01-28 NOTE — Progress Notes (Addendum)
?  ?       ?PROGRESS NOTE ? ?John [name] NGE:[id number] DOB: 04-07-1941 DOA: 01/26/2022 ?PCP: [name], DO ? ?Brief History:  ?81 year old male with a history of recent NSTEMI 12/2021, diabetes mellitus type 2, hyperlipidemia, hypertension, paroxysmal atrial fibrillation, GERD, BPH, and CKD stage III presenting with shortness of breath and respiratory distress.  The patient presented with oxygen saturation 65% on room air.  He was initially placed on nonrebreather.  He was subsequently placed on BiPAP in the emergency department. ?He was recently admitted to the hospital from 01/08/2022 to 01/10/2022 for acute on chronic systolic and diastolic CHF.  He was discharged home with torsemide 20 mg daily.  He has been compliant with his medications and fluid restriction.  In fact, he has had decreased oral intake.  He has only gained 1 pound since discharge from the hospital.  His last weight was 194 pounds on the day of admission.  In addition, the patient also had a hospital admission from 12/24/2021 to 12/29/2021 for which he was treated medically for NSTEMI.  His troponin peaked >24K.  he was discharged home on Isordil, hydralazine, carvedilol, Crestor, and Plavix. ?Since discharge home from the hospital on 01/11/2022, the patient had remained short of breath with exertion.  His daughter states that he has essentially gradually declined since discharge.  After participating with occupational therapy on 01/25/2022, the patient became more short of breath as the day progressed.  He has not had any fevers, chills, headache, neck pain, chest pain, coughing, hemoptysis, nausea, vomiting or diarrhea. ?In the ED, the patient was afebrile hemodynamically stable with oxygen saturation 98-100% on BiPAP.  Troponins were flat 39>>40.  Initial labs showed WBC 9.0, Hgb 10.8, platelets 212, Na+ 136, K+ 4.1 and creatinine 2.17. AST 24 and ALT 25. Initial and repeat Hs Troponin flat at 40 and 39. BNP not yet ordered but will add-on to AM  labs. 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 EKG showed sinus rhythm with PVCs.  The patient was started on IV furosemide, bronchodilators, IV steroids, and azithromycin.  ? ? ?Assessment and Plan: ?* Acute on chronic respiratory failure with hypoxia and hypercapnia (HCC) ?Secondary to COPD exacerbation and CHF exacerbation ?At baseline, patient is on 3 L nasal cannula ?Weaned off BiPAP>>4L ?Wean oxygen for saturation greater 90% ?Intermittently requiring BiPAP ?--noncompliant with CPAP at home ? ?COPD exacerbation ([place]) ?Continue Pulmicort ?Added brovana ?Continue DuoNebs ?Continue IV Solu-Medrol ?Intermittently requiring BiPAP ? ?Acute on chronic combined systolic and diastolic CHF (congestive heart failure) ([place]) ?3623 echo EF 35%, global HK, grade 2 DD ?Continue IV furosemide ?Daily weights--not accurate ?Accurate I's and O's-=-incomplete ?Appreciate cardiology consult ? ?CKD (chronic kidney disease), stage IV ([place]) ?Baseline creatinine 2.0-2.3 ?Monitor with diuresis ? ?Paroxysmal atrial fibrillation (HCC) ?Continue apixaban ?Continue carvedilol ?Optimize potassium ?Currently in sinus ? ?Coronary atherosclerosis of native coronary artery ?No chest pain presently ?Personally reviewed EKG--sinus, LBBB, nonspecific STT ?Troponins are flat 39>>40>>36>>39 ?Continue Plavix, Crestor, carvedilol ? ?BPH (benign prostatic hyperplasia) ?Continue tamulosin ? ?Elevated troponin ?Trends are flat ?No chest pain ?Personally reviewed EKG--sinus, LBBB, nonspecific STT ?Secondary to demand ischemia ?4/7 limited echo--EF 30-35%, trivial pericardial effusion, global HK ?Appreciate cardiology consult ? ?Type 2 diabetes mellitus with hyperglycemia (HCC) ?12/24/2021 hemoglobin A1c 7.1 ?NovoLog sliding scale ?increase dose Semglee ?Add novolog 4 units with meals ? ? ?Essential hypertension ?Continue coreg, hydralazine, isordil ? ?Peripheral vascular disease ([place]) ?- He is s/p right fem-pop bypass at [facility] in 05/2018. Remains on Plavix  and statin therapy.  ? ?Mixed hyperlipidemia ?Continue statin ? ? ?Status is: Inpatient ?Remains inpatient appropriate because: severity of illness requiring BiPAP and IV lasix ?  ?  ?  ?Family Communication:   daughter updated at bedside 4/9 ?  ?Consultants:  cardiology ?  ?Code Status:  DNR ?  ?DVT Prophylaxis: apixaban ?  ?  ?Procedures: ?As Listed in Progress Note Above ?  ?Antibiotics: ?Azthro 4/7>>4/9 ? ? ? ? ? ? ?Subjective: ?Patient having intermittent sob.  Denies, f/c, cp, n/v/d, abd pain. ? ?Objective: ?Vitals:  ? 01/28/22 0700 01/28/22 0717 01/28/22 0900 01/28/22 0914  ?BP: (!) 156/94  (!) 159/51   ?Pulse: (!) 25 77 88 88  ?Resp: (!) 21 (!) 23 20 (!) 21  ?Temp:  (!) 96.8 ?F (36 ?C)    ?TempSrc:  Axillary    ?SpO2: 99% 98% 94% 100%  ?Weight:      ?Height:      ? ? ?Intake/Output Summary (Last 24 hours) at 01/28/2022 1052 ?Last data filed at 01/28/2022 0900 ?Gross per 24 hour  ?Intake 850 ml  ?Output --  ?Net 850 ml  ? ?Weight change: 3.2 kg ?Exam: ? ?General:  Pt is alert, follows commands appropriately, not in acute distress ?HEENT: No icterus, No thrush, No neck mass, [place]/AT ?Cardiovascular: RRR, S1/S2, no rubs, no gallops ?Respiratory: bibasilar rales. No wheeze ?Abdomen: Soft/+BS, non tender, non distended, no guarding ?Extremities: No edema, No lymphangitis, No petechiae, No rashes, no synovitis ? ? ?Data Reviewed: ?I have personally reviewed following labs and imaging studies ?Basic Metabolic Panel: ?Recent Labs  ?Lab 01/26/22 ?0432 01/26/22 ?2229 01/27/22 ?7989 01/27/22 ?2119 01/28/22 ?4174  ?NA 136  --  136 135 136  ?K 4.1  --  4.1 3.9 4.4  ?CL 98  --  98 99 96*  ?CO2 31  --  29 28 30   ?GLUCOSE 228*  --  302* 256* 273*  ?BUN 33*  --  45* 44* 62*  ?CREATININE 2.17*  --  2.36* 2.22* 2.40*  ?CALCIUM 8.5*  --  8.4* 8.3* 8.6*  ?MG  --  2.2 2.1 2.0 2.2  ?PHOS  --  4.5  --   --   --   ? ?Liver Function Tests: ?Recent Labs  ?Lab 01/26/22 ?0432 01/27/22 ?0313  ?AST 24 19  ?ALT 25 20  ?ALKPHOS 77 58  ?BILITOT  0.8 0.6  ?PROT 6.8 5.7*  ?ALBUMIN 3.5 3.0*  ? ?No results for input(s): LIPASE, AMYLASE in the last 168 hours. ?No results for input(s): AMMONIA in the last 168 hours. ?Coagulation Profile: ?No results for input(s): INR, PROTIME in the last 168 hours. ?CBC: ?Recent Labs  ?Lab 01/26/22 ?0432 01/27/22 ?0313  ?WBC 9.0 6.5  ?NEUTROABS 6.6  --   ?HGB 10.8* 8.7*  ?HCT 35.5* 28.3*  ?MCV 95.9 93.1  ?PLT 212 165  ? ?Cardiac Enzymes: ?No results for input(s): CKTOTAL, CKMB, CKMBINDEX, TROPONINI in the last 168 hours. ?BNP: ?Invalid input(s): POCBNP ?CBG: ?Recent Labs  ?Lab 01/27/22 ?0727 01/27/22 ?1121 01/27/22 ?1617 01/27/22 ?2000 01/28/22 ?0719  ?GLUCAP 244* 333* 300* 168* 287*  ? ?HbA1C: ?No results for input(s): HGBA1C in the last 72 hours. ?Urine analysis: ?   ?Component Value Date/Time  ? [place] YELLOW 01/26/2022 2100  ? APPEARANCEUR CLEAR 01/26/2022 2100  ? LABSPEC 1.011 01/26/2022 2100  ? PHURINE 5.0 01/26/2022 2100  ? GLUCOSEU 150 (A) 01/26/2022 2100  ? [place] NEGATIVE 01/26/2022 2100  ? [place] NEGATIVE 01/26/2022 2100  ? KETONESUR NEGATIVE  01/26/2022 2100  ? [place] NEGATIVE 01/26/2022 2100  ? NITRITE NEGATIVE 01/26/2022 2100  ? LEUKOCYTESUR NEGATIVE 01/26/2022 2100  ? ?Sepsis Labs: ?@LABRCNTIP (procalcitonin:4,lacticidven:4) ?) ?Recent Results (from the past 240 hour(s))  ?Respiratory (~20 pathogens) panel by PCR     Status: None  ? Collection Time: 01/26/22 10:27 AM  ? Specimen: Nasopharyngeal Swab; Respiratory  ?Result Value Ref Range Status  ? Adenovirus NOT DETECTED NOT DETECTED Final  ? Coronavirus 229E NOT DETECTED NOT DETECTED Final  ?  Comment: (NOTE) ?The Coronavirus on the Respiratory Panel, DOES NOT test for the novel  ?Coronavirus (2019 nCoV) ?  ? Coronavirus HKU1 NOT DETECTED NOT DETECTED Final  ? Coronavirus NL63 NOT DETECTED NOT DETECTED Final  ? Coronavirus OC43 NOT DETECTED NOT DETECTED Final  ? Metapneumovirus NOT DETECTED NOT DETECTED Final  ? Rhinovirus / Enterovirus NOT DETECTED NOT  DETECTED Final  ? Influenza A NOT DETECTED NOT DETECTED Final  ? Influenza B NOT DETECTED NOT DETECTED Final  ? Parainfluenza Virus 1 NOT DETECTED NOT DETECTED Final  ? Parainfluenza Virus 2 NOT DETECTED N

## 2022-01-28 NOTE — Progress Notes (Signed)
Transfers from bed to toilet, after word he becomes tired and has difficulty regaining his strength and he states that placing him back on bipap helps regain strength.   ?

## 2022-01-29 ENCOUNTER — Inpatient Hospital Stay (HOSPITAL_COMMUNITY): Payer: Medicare Other

## 2022-01-29 DIAGNOSIS — R778 Other specified abnormalities of plasma proteins: Secondary | ICD-10-CM | POA: Diagnosis not present

## 2022-01-29 DIAGNOSIS — J9622 Acute and chronic respiratory failure with hypercapnia: Secondary | ICD-10-CM | POA: Diagnosis not present

## 2022-01-29 DIAGNOSIS — J441 Chronic obstructive pulmonary disease with (acute) exacerbation: Secondary | ICD-10-CM | POA: Diagnosis not present

## 2022-01-29 DIAGNOSIS — N184 Chronic kidney disease, stage 4 (severe): Secondary | ICD-10-CM | POA: Diagnosis not present

## 2022-01-29 DIAGNOSIS — J9621 Acute and chronic respiratory failure with hypoxia: Secondary | ICD-10-CM | POA: Diagnosis not present

## 2022-01-29 DIAGNOSIS — I5043 Acute on chronic combined systolic (congestive) and diastolic (congestive) heart failure: Secondary | ICD-10-CM | POA: Diagnosis not present

## 2022-01-29 LAB — GLUCOSE, CAPILLARY
Glucose-Capillary: 276 mg/dL — ABNORMAL HIGH (ref 70–99)
Glucose-Capillary: 330 mg/dL — ABNORMAL HIGH (ref 70–99)
Glucose-Capillary: 312 mg/dL — ABNORMAL HIGH (ref 70–99)
Glucose-Capillary: 198 mg/dL — ABNORMAL HIGH (ref 70–99)

## 2022-01-29 LAB — BASIC METABOLIC PANEL
Anion gap: 8 (ref 5–15)
BUN: 70 mg/dL — ABNORMAL HIGH (ref 8–23)
CO2: 30 mmol/L (ref 22–32)
Calcium: 8.3 mg/dL — ABNORMAL LOW (ref 8.9–10.3)
Chloride: 96 mmol/L — ABNORMAL LOW (ref 98–111)
Creatinine, Ser: 2.73 mg/dL — ABNORMAL HIGH (ref 0.61–1.24)
GFR, Estimated: 22 mL/min — ABNORMAL LOW (ref >60)
Glucose, Bld: 289 mg/dL — ABNORMAL HIGH (ref 70–99)
Potassium: 4.3 mmol/L (ref 3.5–5.1)
Sodium: 134 mmol/L — ABNORMAL LOW (ref 135–145)

## 2022-01-29 LAB — MAGNESIUM: Magnesium: 2.4 mg/dL (ref 1.7–2.4)

## 2022-01-29 LAB — BRAIN NATRIURETIC PEPTIDE: B Natriuretic Peptide: 968 pg/mL — ABNORMAL HIGH (ref 0.0–100.0)

## 2022-01-29 MED ORDER — INSULIN ASPART 100 UNIT/ML IJ SOLN
7.0000 [IU] | Freq: Three times a day (TID) | INTRAMUSCULAR | Status: DC
  Administered 2022-01-30 – 2022-02-06 (×18): 7 [IU] via SUBCUTANEOUS

## 2022-01-29 MED ORDER — INSULIN GLARGINE-YFGN 100 UNIT/ML ~~LOC~~ SOLN
25.0000 [IU] | Freq: Every day | SUBCUTANEOUS | Status: DC
  Administered 2022-01-29: 25 [IU] via SUBCUTANEOUS
  Filled 2022-01-29 (×2): qty 0.25

## 2022-01-29 NOTE — Progress Notes (Signed)
**Note De-Identified  Obfuscation** Patient removed from BIPAP and placed on [place], tolerating well. VS WNL, BBS clr/dim.  RRT to continue to monitor. ?

## 2022-01-29 NOTE — Consult Note (Signed)
? ?NAME:  John [name], MRN:  [id number], DOB:  [birth year], LOS: 3 ?ADMISSION DATE:  01/26/2022, CONSULTATION DATE:  01/29/2022 ? ?REFERRING MD:  Tat, TRH , CHIEF COMPLAINT: Respiratory failure on BiPAP ? ?History of Present Illness:  ?81 year old ex-smoker admitted 4/7 for shortness of breath, respiratory distress and found to be hypoxic to 65% on room air, initially required nonrebreather and then BiPAP.  Initial labs significant for no leukocytosis, hemoglobin 10.8, creatinine 2.2 .  Chest x-ray showed CHF superimposed on hyperinflation.  BNP was 710 ?PCCM consulted on 4/10 due to persistent BiPAP requirement ? ?Recent admission 5/85/2778 for acute systolic heart failure and NSTEMI, cardiac cath deferred due to AKI, peak creatinine 3.8.  His shortness of breath persisted after admission and gradually worsened ?I/O shows +2.5 L ? ?Pertinent  Medical History  ?-Ischemic cardiomyopathy , DES x2 to LAD , Non-STEMI 12/2021, cath not performed due to AKI ?-HFrEF, EF 35% in 12/2021 ?-Paroxysmal atrial fibrillation ?-Type 2 diabetes ?-PAD status post right femoropopliteal bypass at [facility] 05/2018 ?-CKD stage IIIb ?-CVA ?-Smoked 40 pack years before he quit at age 88 ? ?Significant Hospital Events: ?Including procedures, antibiotic start and stop dates in addition to other pertinent events   ?Echo 4/7 EF 30 to 35%, global hypokinesis, grade 2 diastolic dysfunction ?CT chest/abdomen/pelvis 12/24/2021 severe multilobar bronchopneumonia ?With small bilateral effusions ? ?Interim History / Subjective:  ?Used BiPAP overnight, was on 4 L nasal cannula until afternoon when he got short of breath and requested BiPAP ? ?Objective   ?Blood pressure (!) 155/56, pulse 71, temperature 98.1 ?F (36.7 ?C), temperature source Oral, resp. rate 20, height 5\' 7"  (1.702 m), weight 88.8 kg, SpO2 98 %. ?   ?   ? ?Intake/Output Summary (Last 24 hours) at 01/29/2022 1359 ?Last data filed at 01/29/2022 0900 ?Gross per 24 hour  ?Intake 1170 ml  ?Output  375 ml  ?Net 795 ml  ? ?Filed Weights  ? 01/26/22 1140 01/28/22 0500 01/29/22 0500  ?Weight: 86.8 kg 90 kg 88.8 kg  ? ? ?Examination: ?General: Elderly man sitting up in bed, on BiPAP fullface mask, no distress ?HENT: Mild pallor, no icterus, no JVD ?Lungs: Bibasal dry crackles, no accessory muscle use, no rhonchi ?Cardiovascular: S1-S2 regular, ejection systolic murmur 2/ 6 at apex ?Abdomen: Soft, nontender abdomen, no fluid thrill ?Extremities: No edema, no deformity ?Neuro: Alert oriented x3, no asterixis ?GU: Clear urine ? ?Chest x-ray 4/7 independently reviewed which shows small right effusion and bilateral interstitial infiltrates ? ?Resolved Hospital Problem list   ? ? ?Assessment & Plan:  ?Acute hypoxic respiratory failure ?Acute on chronic systolic heart failure ?-He has persistent dyspnea especially on minimal exertion even though saturations remained stable on 4 L nasal cannula ?-He appears dry but has bibasilar dry crackles which raises the question of underlying ILD.  CT chest from 12/2021 showed bilateral bronchopneumonia ? ?Recommend ?-High-resolution CT chest to clarify ?-Agree with treating as COPD with budesonide and arformoterol nebs , no evidence of bronchospasm so doubt Solu-Medrol is doing much here ?-Goal-directed therapy for heart failure with diuretic, nitrates and consider adding hydralazine if blood pressure permits.  Diuresed for equal balance or as dictated by BNP/renal function ? ?Best Practice (right click and "Reselect all SmartList Selections" daily)  ? ?Diet/type: Regular consistency (see orders) ?DVT prophylaxis: DOAC ?GI prophylaxis: N/A ?Lines: N/A ?Foley:  N/A ?Code Status:  DNR ?Last date of multidisciplinary goals of care discussion [ongoing with patient and daughter] ? ?Labs   ?CBC: ?Recent  Labs  ?Lab 01/26/22 ?0432 01/27/22 ?0313  ?WBC 9.0 6.5  ?NEUTROABS 6.6  --   ?HGB 10.8* 8.7*  ?HCT 35.5* 28.3*  ?MCV 95.9 93.1  ?PLT 212 165  ? ? ?Basic Metabolic Panel: ?Recent Labs  ?Lab  01/26/22 ?0432 01/26/22 ?0630 01/27/22 ?1601 01/27/22 ?0932 01/28/22 ?3557 01/29/22 ?0418  ?NA 136  --  136 135 136 134*  ?K 4.1  --  4.1 3.9 4.4 4.3  ?CL 98  --  98 99 96* 96*  ?CO2 31  --  29 28 30 30   ?GLUCOSE 228*  --  302* 256* 273* 289*  ?BUN 33*  --  45* 44* 62* 70*  ?CREATININE 2.17*  --  2.36* 2.22* 2.40* 2.73*  ?CALCIUM 8.5*  --  8.4* 8.3* 8.6* 8.3*  ?MG  --  2.2 2.1 2.0 2.2 2.4  ?PHOS  --  4.5  --   --   --   --   ? ?GFR: ?Estimated Creatinine Clearance: 21.8 mL/min (A) (by C-G formula based on SCr of 2.73 mg/dL (H)). ?Recent Labs  ?Lab 01/26/22 ?0432 01/26/22 ?3220 01/27/22 ?0313 01/28/22 ?2542  ?PROCALCITON  --  <0.10  --  <0.10  ?WBC 9.0  --  6.5  --   ? ? ?Liver Function Tests: ?Recent Labs  ?Lab 01/26/22 ?0432 01/27/22 ?0313  ?AST 24 19  ?ALT 25 20  ?ALKPHOS 77 58  ?BILITOT 0.8 0.6  ?PROT 6.8 5.7*  ?ALBUMIN 3.5 3.0*  ? ?No results for input(s): LIPASE, AMYLASE in the last 168 hours. ?No results for input(s): AMMONIA in the last 168 hours. ? ?ABG ?   ?Component Value Date/Time  ? PHART 7.42 12/24/2021 1458  ? PCO2ART 42 12/24/2021 1458  ? PO2ART 77 (L) 12/24/2021 1458  ? HCO3 34.8 (H) 01/26/2022 0432  ? TCO2 23 06/25/2013 0936  ? ACIDBASEDEF 0.7 12/24/2021 0746  ? O2SAT 95.5 01/26/2022 0432  ?  ? ?Coagulation Profile: ?No results for input(s): INR, PROTIME in the last 168 hours. ? ?Cardiac Enzymes: ?No results for input(s): CKTOTAL, CKMB, CKMBINDEX, TROPONINI in the last 168 hours. ? ?HbA1C: ?Hgb A1c MFr Bld  ?Date/Time Value Ref Range Status  ?12/24/2021 07:46 AM 7.1 (H) 4.8 - 5.6 % Final  ?  Comment:  ?  (NOTE) ?Pre diabetes:          5.7%-6.4% ? ?Diabetes:              >6.4% ? ?Glycemic control for   <7.0% ?adults with diabetes ?  ?09/13/2021 07:56 AM 8.7 (H) 4.8 - 5.6 % Final  ?  Comment:  ?  (NOTE) ?Pre diabetes:          5.7%-6.4% ? ?Diabetes:              >6.4% ? ?Glycemic control for   <7.0% ?adults with diabetes ?  ? ? ?CBG: ?Recent Labs  ?Lab 01/28/22 ?1109 01/28/22 ?1625 01/28/22 ?2155  01/29/22 ?7062 01/29/22 ?1112  ?GLUCAP 389* 281* 275* 276* 330*  ? ? ?Review of Systems:   ?Denies preceding dyspnea prior to MI in 2023 ?Now complains of persistent dyspnea on minimal exertion ?No chest pain ?Reports 2 pillow orthopnea ? 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  ? ?Surgical History:  ? ?Past Surgical  History:  ?Procedure Laterality Date  ? BONE ANCHORED HEARING AID IMPLANT Left 06/25/2013  ? Procedure: BONE ANCHORED HEARING AID (BAHA) IMPLANT;  Surgeon: Fannie Knee, MD;  Location: [facility];  Service: ENT;  Laterality: Left;  ? CAROTID STENT INSERTION Left   ? COCHLEAR IMPLANT    ? CORONARY STENT PLACEMENT    ? MASS EXCISION Left 06/25/2013  ? Procedure: EXCISION LEFT TEMPORAL MASS;  Surgeon: Fannie Knee, MD;  Location: [facility];  Service: ENT;  Laterality: Left;  ? TONSILLECTOMY    ?  ? ?Social History:  ? reports that he quit smoking about 32 years ago. His smoking use included cigarettes. He has never used smokeless tobacco. He reports that he does not currently use alcohol. He reports that he does not use drugs.  ? ?Family History:  ?His family history includes Depression in some other family members; Diabetes in his daughter, maternal aunt, mother, and another family member; Drug abuse in his daughter.  ? ?Allergies ?Allergies  ?Allergen Reactions  ? Brilinta [Ticagrelor] Shortness Of Breath  ? Penicillins Hives  ? Ezetimibe-Simvastatin Other (See Comments)  ?  Myalgia ?  ?  ? ?Home Medications  ?Prior to Admission medications   ?Medication Sig Start Date End Date Taking? Authorizing Provider  ?albuterol (VENTOLIN HFA) 108 (90 Base) MCG/ACT inhaler  Inhale 2 puffs into the lungs every 6 (six) hours as needed for wheezing or shortness of breath. 12/20/20  Yes Pokhrel, Laxman, MD  ?allopurinol (ZYLOPRIM) 300 MG tablet Take 300 mg by mouth daily. 06/21/12  Yes P

## 2022-01-29 NOTE — Progress Notes (Signed)
?  ?       ?PROGRESS NOTE ? ?Dinh Ayotte STM:[id number] DOB: 1941-05-08 DOA: 01/26/2022 ?PCP: [name], DO ? ?Brief History:  ?81 year old male with a history of recent NSTEMI 12/2021, diabetes mellitus type 2, hyperlipidemia, hypertension, paroxysmal atrial fibrillation, GERD, BPH, and CKD stage III presenting with shortness of breath and respiratory distress.  The patient presented with oxygen saturation 65% on room air.  He was initially placed on nonrebreather.  He was subsequently placed on BiPAP in the emergency department. ?He was recently admitted to the hospital from 01/08/2022 to 01/10/2022 for acute on chronic systolic and diastolic CHF.  He was discharged home with torsemide 20 mg daily.  He has been compliant with his medications and fluid restriction.  In fact, he has had decreased oral intake.  He has only gained 1 pound since discharge from the hospital.  His last weight was 194 pounds on the day of admission.  In addition, the patient also had a hospital admission from 12/24/2021 to 12/29/2021 for which he was treated medically for NSTEMI.  His troponin peaked >24K.  he was discharged home on Isordil, hydralazine, carvedilol, Crestor, and Plavix. ?Since discharge home from the hospital on 01/11/2022, the patient had remained short of breath with exertion.  His daughter states that he has essentially gradually declined since discharge.  After participating with occupational therapy on 01/25/2022, the patient became more short of breath as the day progressed.  He has not had any fevers, chills, headache, neck pain, chest pain, coughing, hemoptysis, nausea, vomiting or diarrhea. ?In the ED, the patient was afebrile hemodynamically stable with oxygen saturation 98-100% on BiPAP.  Troponins were flat 39>>40.  Initial labs showed WBC 9.0, Hgb 10.8, platelets 212, Na+ 136, K+ 4.1 and creatinine 2.17. AST 24 and ALT 25. Initial and repeat Hs Troponin flat at 40 and 39. BNP not yet ordered but will add-on to AM  labs. 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 EKG showed sinus rhythm with PVCs.  The patient was started on IV furosemide, bronchodilators, IV steroids, and azithromycin.  ? ? ? ? ? ?Assessment and Plan: ?* Acute on chronic respiratory failure with hypoxia and hypercapnia (HCC) ?Secondary to COPD exacerbation and CHF exacerbation ?At baseline, patient is on 3 L nasal cannula ?Weaned off BiPAP>>4L ?Wean oxygen for saturation greater 90% ?Intermittently requiring BiPAP especially after exertion ?--noncompliant with CPAP at home ? ?COPD exacerbation ([place]) ?Continue Pulmicort ?Added brovana ?Continue DuoNebs ?Continue IV Solu-Medrol ?Intermittently requiring BiPAP ? ?Acute on chronic combined systolic and diastolic CHF (congestive heart failure) ([place]) ?3623 echo EF 35%, global HK, grade 2 DD ?Continue IV furosemide per cardiology ?Daily weights--not accurate ?Accurate I's and O's-=-incomplete ?Appreciate cardiology consult ? ?CKD (chronic kidney disease), stage IV ([place]) ?Baseline creatinine 2.0-2.3 ?Monitor with diuresis ? ?Paroxysmal atrial fibrillation (HCC) ?Continue apixaban ?Continue carvedilol ?Optimize potassium ?Currently in sinus ? ?Coronary atherosclerosis of native coronary artery ?No chest pain presently ?Personally reviewed EKG--sinus, LBBB, nonspecific STT ?Troponins are flat 39>>40>>36>>39 ?Continue Plavix, Crestor, carvedilol ? ?BPH (benign prostatic hyperplasia) ?Continue tamulosin ? ?Elevated troponin ?Trends are flat ?No chest pain ?Personally reviewed EKG--sinus, LBBB, nonspecific STT ?Secondary to demand ischemia ?4/7 limited echo--EF 30-35%, trivial pericardial effusion, global HK ?Appreciate cardiology consult ? ?Type 2 diabetes mellitus with hyperglycemia (HCC) ?12/24/2021 hemoglobin A1c 7.1 ?NovoLog sliding scale ?increase dose Semglee to 25 units ?Increase novolog 7 units with meals ? ? ?Essential hypertension ?Continue coreg, hydralazine, isordil ? ?Peripheral vascular disease ([place]) ?- He is s/p  right fem-pop bypass at [facility] in 05/2018. Remains on Plavix and statin therapy.  ? ?Mixed hyperlipidemia ?Continue statin ? ? ?Status is: Inpatient ?Remains inpatient appropriate because: severity of illness requiring BiPAP and IV lasix ?  ?  ?  ?Family Communication:   daughter updated at bedside 4/9 ?  ?Consultants:  cardiology ?  ?Code Status:  DNR ?  ?DVT Prophylaxis: apixaban ?  ?  ?Procedures: ?As Listed in Progress Note Above ?  ?Antibiotics: ?Azthro 4/7>>4/9 ?  ? ? ? ? ? ?Subjective: ?Patient denies fevers, chills, headache, chest pain, dyspnea, nausea, vomiting, diarrhea, abdominal pain, dysuria, hematuria, hematochezia, and melena. ? ? ?Objective: ?Vitals:  ? 01/29/22 1131 01/29/22 1300 01/29/22 1400 01/29/22 1500  ?BP:  (!) 146/80 (!) 166/102 (!) 161/88  ?Pulse: 71 79 84 77  ?Resp: 20 20 (!) 28 (!) 21  ?Temp: 98.1 ?F (36.7 ?C)     ?TempSrc: Oral     ?SpO2: 98% 97% 95% 99%  ?Weight:      ?Height:      ? ? ?Intake/Output Summary (Last 24 hours) at 01/29/2022 1744 ?Last data filed at 01/29/2022 1415 ?Gross per 24 hour  ?Intake 1089.97 ml  ?Output 375 ml  ?Net 714.97 ml  ? ?Weight change: -1.2 kg ?Exam: ? ?General:  Pt is alert, follows commands appropriately, not in acute distress ?HEENT: No icterus, No thrush, No neck mass, /AT ?Cardiovascular: RRR, S1/S2, no rubs, no gallops ?Respiratory: bibasilar rales. No wheeze ?Abdomen: Soft/+BS, non tender, non distended, no guarding ?Extremities: No edema, No lymphangitis, No petechiae, No rashes, no synovitis ? ? ?Data Reviewed: ?I have personally reviewed following labs and imaging studies ?Basic Metabolic Panel: ?Recent Labs  ?Lab 01/26/22 ?0432 01/26/22 ?3382 01/27/22 ?5053 01/27/22 ?9767 01/28/22 ?3419 01/29/22 ?0418  ?NA 136  --  136 135 136 134*  ?K 4.1  --  4.1 3.9 4.4 4.3  ?CL 98  --  98 99 96* 96*  ?CO2 31  --  29 28 30 30   ?GLUCOSE 228*  --  302* 256* 273* 289*  ?BUN 33*  --  45* 44* 62* 70*  ?CREATININE 2.17*  --  2.36* 2.22* 2.40* 2.73*  ?CALCIUM  8.5*  --  8.4* 8.3* 8.6* 8.3*  ?MG  --  2.2 2.1 2.0 2.2 2.4  ?PHOS  --  4.5  --   --   --   --   ? ?Liver Function Tests: ?Recent Labs  ?Lab 01/26/22 ?0432 01/27/22 ?0313  ?AST 24 19  ?ALT 25 20  ?ALKPHOS 77 58  ?BILITOT 0.8 0.6  ?PROT 6.8 5.7*  ?ALBUMIN 3.5 3.0*  ? ?No results for input(s): LIPASE, AMYLASE in the last 168 hours. ?No results for input(s): AMMONIA in the last 168 hours. ?Coagulation Profile: ?No results for input(s): INR, PROTIME in the last 168 hours. ?CBC: ?Recent Labs  ?Lab 01/26/22 ?0432 01/27/22 ?0313  ?WBC 9.0 6.5  ?NEUTROABS 6.6  --   ?HGB 10.8* 8.7*  ?HCT 35.5* 28.3*  ?MCV 95.9 93.1  ?PLT 212 165  ? ?Cardiac Enzymes: ?No results for input(s): CKTOTAL, CKMB, CKMBINDEX, TROPONINI in the last 168 hours. ?BNP: ?Invalid input(s): POCBNP ?CBG: ?Recent Labs  ?Lab 01/28/22 ?1625 01/28/22 ?2155 01/29/22 ?0728 01/29/22 ?1112 01/29/22 ?1623  ?GLUCAP 281* 275* 276* 330* 312*  ? ?HbA1C: ?No results for input(s): HGBA1C in the last 72 hours. ?Urine analysis: ?   ?Component Value Date/Time  ? [place] YELLOW 01/26/2022 2100  ? APPEARANCEUR CLEAR 01/26/2022 2100  ?  LABSPEC 1.011 01/26/2022 2100  ? PHURINE 5.0 01/26/2022 2100  ? GLUCOSEU 150 (A) 01/26/2022 2100  ? [place] NEGATIVE 01/26/2022 2100  ? [place] NEGATIVE 01/26/2022 2100  ? [place] NEGATIVE 01/26/2022 2100  ? [place] NEGATIVE 01/26/2022 2100  ? NITRITE NEGATIVE 01/26/2022 2100  ? LEUKOCYTESUR NEGATIVE 01/26/2022 2100  ? ?Sepsis Labs: ?@LABRCNTIP (procalcitonin:4,lacticidven:4) ?) ?Recent Results (from the past 240 hour(s))  ?Respiratory (~20 pathogens) panel by PCR     Status: None  ? Collection Time: 01/26/22 10:27 AM  ? Specimen: Nasopharyngeal Swab; Respiratory  ?Result Value Ref Range Status  ? Adenovirus NOT DETECTED NOT DETECTED Final  ? Coronavirus 229E NOT DETECTED NOT DETECTED Final  ?  Comment: (NOTE) ?The Coronavirus on the Respiratory Panel, DOES NOT test for the novel  ?Coronavirus (2019 nCoV) ?  ? Coronavirus HKU1 NOT DETECTED  NOT DETECTED Final  ? Coronavirus NL63 NOT DETECTED NOT DETECTED Final  ? Coronavirus OC43 NOT DETECTED NOT DETECTED Final  ? Metapneumovirus NOT DETECTED NOT DETECTED Final  ? Rhinovirus / Enterovirus N

## 2022-01-29 NOTE — Progress Notes (Signed)
Inpatient Diabetes Program Recommendations ? ?AACE/ADA: New Consensus Statement on Inpatient Glycemic Control  ?Target Ranges:  Prepandial:   less than 140 mg/dL ?     Peak postprandial:   less than 180 mg/dL (1-2 hours) ?     Critically ill patients:  140 - 180 mg/dL  ? ? Latest Reference Range & Units 01/29/22 04:18  ?Glucose 70 - 99 mg/dL 289 (H)  ? ? Latest Reference Range & Units 01/28/22 07:19 01/28/22 11:09 01/28/22 16:25 01/28/22 21:55  ?Glucose-Capillary 70 - 99 mg/dL 287 (H) 389 (H) 281 (H) 275 (H)  ? ?Review of Glycemic Control ? ?Diabetes history: DM2 ?Outpatient Diabetes medications: Lantus 50 units daily ?Current orders for Inpatient glycemic control: Semglee 20 units QHS, Novolog 4 units TID with meals, Novolog 0-9 units TID with meals, Novolog 0-5 units QHS; Solumedrol 40 mg Q12H ? ?Inpatient Diabetes Program Recommendations:   ? ?Insulin: If steroids are continued as ordered, please consider increasing Semglee to 25 units QHS and meal coverage to Novolog 7 units TID with meals. ? ?Thanks, ?Barnie Alderman, RN, MSN, CDE ?Diabetes Coordinator ?Inpatient Diabetes Program ?[phone] (Team Pager from 8am to 5pm) ? ? ? ? ?

## 2022-01-29 NOTE — TOC Initial Note (Addendum)
Transition of Care (TOC) - Initial/Assessment Note  ? ? ?Patient Details  ?Name: John [name] ?MRN: [id number] ?Date of Birth: Feb 20, 1941 ? ?Transition of Care (TOC) CM/SW Contact:    ?Iona Beard, LCSWA ?Phone Number: ?01/29/2022, 10:41 AM ? ?Clinical Narrative:                 ?Pt is high risk for readmission. CSW spoke with pt in room to complete assessment. Pt states that he lives with his daughter. Pt is mostly independent with his ADLs. Pt states he can sometimes drive though family will assist if needed. Pt states that he has had HH in the past. Per chart review pt was most recently set up with [facility] for [facility] services. CSW reached out to [facility] rep to see if pt is still active. Pt states that he has canes, a rollator and home O2 supplied through [facility].  ? ?CSW spoke with pts daughter who states that pt is currently active with [facility]. Pts daughter also states that she has reached out to hospice about palliative services. CSW explained that referral could be sent to [facility] via the [facility] about palliative services. CSW sent referral via [facility]. TOC to follow for D/C needs.  ? ?Expected Discharge Plan: [place] ?Barriers to Discharge: Continued Medical Work up ? ? ?Patient Goals and CMS Choice ?Patient states their goals for this hospitalization and ongoing recovery are:: Return home ?CMS Medicare.gov Compare Post Acute Care list provided to:: Patient ?Choice offered to / list presented to : Patient ? ?Expected Discharge Plan and Services ?Expected Discharge Plan: [place] ?In-house Referral: Clinical Social Work ?Discharge Planning Services: CM Consult ?  ?Living arrangements for the past 2 months: [place] ?                ?  ?  ?  ?  ?  ?  ?  ?  ?  ?  ? ?Prior Living Arrangements/Services ?Living arrangements for the past 2 months: [place] ?Lives with:: Adult Children ?Patient language and need for interpreter reviewed:: Yes ?Do you  feel safe going back to the place where you live?: Yes      ?Need for Family Participation in Patient Care: No (Comment) ?Care giver support system in place?: Yes (comment) ?Current home services: DME (cane, rollator, O2) ?Criminal Activity/Legal Involvement Pertinent to Current Situation/Hospitalization: No - Comment as needed ? ?Activities of Daily Living ?Home Assistive Devices/Equipment: Eyeglasses, Oxygen ?ADL Screening (condition at time of admission) ?Patient's cognitive ability adequate to safely complete daily activities?: Yes ?Is the patient deaf or have difficulty hearing?: Yes ?Does the patient have difficulty seeing, even when wearing glasses/contacts?: No ?Does the patient have difficulty concentrating, remembering, or making decisions?: No ?Patient able to express need for assistance with ADLs?: Yes ?Does the patient have difficulty dressing or bathing?: Yes ?Independently performs ADLs?: Yes (appropriate for developmental age) ?Does the patient have difficulty walking or climbing stairs?: Yes ?Weakness of Legs: Both ?Weakness of Arms/Hands: None ? ?Permission Sought/Granted ?  ?  ?   ?   ?   ?   ? ?Emotional Assessment ?Appearance:: Appears stated age ?Attitude/Demeanor/Rapport: Engaged ?Affect (typically observed): Accepting ?Orientation: : Oriented to Self, Oriented to Place, Oriented to  Time, Oriented to Situation ?Alcohol / Substance Use: Not Applicable ?Psych Involvement: No (comment) ? ?Admission diagnosis:  COPD exacerbation ([place]) [J44.1] ?COPD with acute exacerbation ([place]) [J44.1] ?Patient Active Problem List  ?  Diagnosis Date Noted  ? COPD exacerbation ([place]) 01/26/2022  ? CKD (chronic kidney disease), stage IV ([place]) 01/26/2022  ? Paroxysmal atrial fibrillation ([place]) 01/26/2022  ? Diabetes mellitus, type 2 ([place]) 01/09/2022  ? Acute on chronic respiratory failure with hypoxia and hypercapnia ([place]) 01/08/2022  ? Hypoxia 12/24/2021  ? D-dimer, elevated 12/24/2021  ?  Class: Acute  ? NSTEMI  (non-ST elevated myocardial infarction) ([place]) 12/24/2021  ? Acute on chronic combined systolic and diastolic CHF (congestive heart failure) ([place]) 09/13/2021  ? Acute respiratory failure with hypoxia and hypercapnia ([place]) 09/13/2021  ? Type 2 diabetes mellitus with hyperglycemia ([place]) 09/13/2021  ? Elevated troponin 09/13/2021  ? Obstructive sleep apnea 09/13/2021  ? BPH (benign prostatic hyperplasia) 09/13/2021  ? CKD (chronic kidney disease), stage III ([place]) 09/13/2021  ? Diabetic neuropathy ([place]) 09/13/2021  ? Chest pain 12/20/2020  ? Acute diastolic CHF (congestive heart failure) ([place]) 12/19/2020  ? Essential hypertension 10/03/2012  ? CAD (coronary artery disease) of artery bypass graft 06/26/2012  ? Cardiomyopathy, 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  ? Essential hypertension, malignant 06/26/2012  ? Mixed hyperlipidemia 06/26/2012  ? Peripheral vascular disease ([place]) 06/26/2012  ? Coronary atherosclerosis of native coronary artery 06/26/2012  ? ?PCP:  [name], DO ?Pharmacy:   ?[facility] ?[address] ?[place] [address] ?Phone: [phone] Fax: [phone] ? ?[id number] [place], [place] - [address] ?[address] [place] [address] ?Phone: [phone] Fax: [phone] ? ?[place] ?[address] ?[place] [address] ?Phone: [phone] Fax: [phone] ? ?[id number] [place], [place] ?[place] ?[place] [address] ?Phone: [phone] Fax: [phone] ? ? ? ? ?Social Determinants of Health (SDOH) Interventions ?  ? ?Readmission Risk Interventions ? ?  01/29/2022  ? 10:39 AM 12/29/2021  ? 11:45 AM  ?Readmission Risk Prevention Plan  ?Transportation Screening Complete Complete  ?PCP or Specialist Appt within 3-5 Days  Complete  ?[place] or Home Care Consult  Complete  ?Social Work Consult for [place]  Planning/Counseling  Complete  ?Palliative Care Screening  Not Applicable  ?Medication Review Press photographer) Complete Complete  ?[place] or Home Care Consult Complete   ?SW Recovery Care/Counseling Consult Complete   ?Palliative Care Screening Not Applicable   ?[place] Not Applicable   ? ? ? ?

## 2022-01-29 NOTE — Progress Notes (Addendum)
? ?Progress Note ? ?Patient Name: John [name] ?Date of Encounter: 01/29/2022 ? ?Primary Cardiologist: Kirk Ruths, MD ? ?Subjective  ? ?Still feeling SOB at rest. No chest pain. No edema. Reports good UOP, unfortunately just had episode of incontinence so I/O's not well charted. ? ?Inpatient Medications  ?  ?Scheduled Meds: ? apixaban  2.5 mg Oral BID  ? arformoterol  15 mcg Nebulization BID  ? budesonide (PULMICORT) nebulizer solution  0.5 mg Nebulization BID  ? carvedilol  12.5 mg Oral BID WC  ? chlorhexidine  15 mL Mouth Rinse BID  ? Chlorhexidine Gluconate Cloth  6 each Topical Q0600  ? clopidogrel  75 mg Oral Daily  ? furosemide  40 mg Intravenous Q12H  ? gabapentin  300 mg Oral QHS  ? insulin aspart  0-5 Units Subcutaneous QHS  ? insulin aspart  0-9 Units Subcutaneous TID WC  ? insulin aspart  4 Units Subcutaneous TID WC  ? insulin glargine-yfgn  20 Units Subcutaneous QHS  ? ipratropium-albuterol  3 mL Nebulization Q6H  ? isosorbide dinitrate  20 mg Oral BID  ? mouth rinse  15 mL Mouth Rinse q12n4p  ? methylPREDNISolone (SOLU-MEDROL) injection  40 mg Intravenous Q12H  ? pantoprazole  40 mg Oral Q breakfast  ? rosuvastatin  10 mg Oral QPM  ? tamsulosin  0.4 mg Oral Daily  ? ?Continuous Infusions: ? sodium chloride Stopped (01/26/22 2157)  ? azithromycin 500 mg (01/28/22 1040)  ? ?PRN Meds: ?sodium chloride, acetaminophen, alum & mag hydroxide-simeth, ipratropium-albuterol  ? ?Vital Signs  ?  ?Vitals:  ? 01/29/22 0400 01/29/22 0500 01/29/22 0806 01/29/22 0831  ?BP: (!) 166/80   (!) 142/82  ?Pulse: 75  81 80  ?Resp: 18  (!) 22 17  ?Temp:   (!) 97.2 ?F (36.2 ?C)   ?TempSrc:   Axillary   ?SpO2: 91%  92% 92%  ?Weight:  88.8 kg    ?Height:      ? ? ?Intake/Output Summary (Last 24 hours) at 01/29/2022 0836 ?Last data filed at 01/28/2022 1800 ?Gross per 24 hour  ?Intake 930 ml  ?Output --  ?Net 930 ml  ? ? ?  01/29/2022  ?  5:00 AM 01/28/2022  ?  5:00 AM 01/26/2022  ? 11:40 AM  ?Last 3 Weights  ?Weight (lbs) 195 lb 12.3  oz 198 lb 6.6 oz 191 lb 5.8 oz  ?Weight (kg) 88.8 kg 90 kg 86.8 kg  ?  ? ?Telemetry  ?  ?NSR with frequent PVCs, v. bigeminy, one 12 beat run NSVT - Personally Reviewed ? ?Physical Exam  ? ?GEN: No acute distress.  ?HEENT: Normocephalic, atraumatic, sclera non-icteric. ?Neck: No JVD or bruits. ?Cardiac: irregular rhythm with frequent ectopy, no murmurs, rubs, or gallops.  ?Respiratory: Diffusely diminished to auscultation bilaterally without wheezing, rales or rhonchi. Breathing is unlabored on O2. ?GI: Soft, nontender, non-distended, BS +x 4. ?MS: no deformity. ?Extremities: No clubbing or cyanosis. No edema. Distal pedal pulses are 2+ and equal bilaterally. ?Neuro:  AAOx3. Follows commands. ?Psych:  Responds to questions appropriately with a normal affect. ? ?Labs  ?  ?High Sensitivity Troponin:   ?Recent Labs  ?Lab 01/08/22 ?2352 01/26/22 ?0432 01/26/22 ?2263 01/27/22 ?3354 01/27/22 ?5625  ?TROPONINIHS 118* 40* 39* 36* 39*  ?   ? ?Cardiac EnzymesNo results for input(s): TROPONINI in the last 168 hours. No results for input(s): TROPIPOC in the last 168 hours.  ? ?Chemistry ?Recent Labs  ?Lab 01/26/22 ?0432 01/27/22 ?6389 01/27/22 ?3734 01/28/22 ?2876  01/29/22 ?0418  ?NA 136   < > 135 136 134*  ?K 4.1   < > 3.9 4.4 4.3  ?CL 98   < > 99 96* 96*  ?CO2 31   < > 28 30 30   ?GLUCOSE 228*   < > 256* 273* 289*  ?BUN 33*   < > 44* 62* 70*  ?CREATININE 2.17*   < > 2.22* 2.40* 2.73*  ?CALCIUM 8.5*   < > 8.3* 8.6* 8.3*  ?PROT 6.8  --  5.7*  --   --   ?ALBUMIN 3.5  --  3.0*  --   --   ?AST 24  --  19  --   --   ?ALT 25  --  20  --   --   ?ALKPHOS 77  --  58  --   --   ?BILITOT 0.8  --  0.6  --   --   ?GFRNONAA 29*   < > 29* 26* 22*  ?ANIONGAP 7   < > 8 10 8   ? < > = values in this interval not displayed.  ?  ? ?Hematology ?Recent Labs  ?Lab 01/26/22 ?0432 01/27/22 ?0313  ?WBC 9.0 6.5  ?RBC 3.70* 3.04*  ?HGB 10.8* 8.7*  ?HCT 35.5* 28.3*  ?MCV 95.9 93.1  ?MCH 29.2 28.6  ?MCHC 30.4 30.7  ?RDW 15.8* 15.6*  ?PLT 212 165   ? ? ?BNP ?Recent Labs  ?Lab 01/26/22 ?0432 01/29/22 ?0418  ?BNP 710.0* 968.0*  ?  ? ?DDimer No results for input(s): DDIMER in the last 168 hours.  ? ?Radiology  ?  ?No results found. ? ?Cardiac Studies  ? ?Limited echo 01/26/22 ? 1. Left ventricular ejection fraction, by estimation, is 30 to 35%. The  ?left ventricle has moderately decreased function. The left ventricle  ?demonstrates global hypokinesis. The left ventricular internal cavity size  ?was mildly to moderately dilated.  ?There is mild left ventricular hypertrophy.  ? 2. Right ventricular systolic function is normal. The right ventricular  ?size is normal.  ? 3. The inferior vena cava is dilated in size with >50% respiratory  ?variability, suggesting right atrial pressure of 8 mmHg.  ? ?Comparison(s): The left ventricular function is worsened.  ? ?Full echo 12/25/21 ? ? 1. Left ventricular ejection fraction, by estimation, is 35%. The left  ?ventricle has moderately decreased function. The left ventricle  ?demonstrates global hypokinesis. The left ventricular internal cavity size  ?was mildly dilated. There is mild left  ?ventricular hypertrophy. Left ventricular diastolic parameters are  ?consistent with Grade II diastolic dysfunction (pseudonormalization).  ? 2. Right ventricular systolic function is normal. The right ventricular  ?size is normal. Tricuspid regurgitation signal is inadequate for assessing  ?PA pressure.  ? 3. The mitral valve is normal in structure. Trivial mitral valve  ?regurgitation. No evidence of mitral stenosis.  ? 4. The aortic valve is tricuspid. There is mild calcification of the  ?aortic valve. Aortic valve regurgitation is not visualized. No aortic  ?stenosis is present.  ? ?Patient Profile  ?   ?81 y.o. male with  CAD (s/p multiple stents with most recent cath in 2013 showing EF of 40% with DESx2 to LAD, NSTEMI in 12/2021 with EF of 35% and medical management recommended given his AKI), chronic HFrEF (EF 40-45% in 08/2021, at  35% in 12/2021), paroxysmal atrial fibrillation, Type 2 DM, HTN, HLD, PAD (s/p right fem-pop bypass at 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 Had  recent admission 12/2021 with troponins >24k and EF further declined to 35%, cath not pursued due to AKI on CKD with peak Cr 3.79. Readmitted 01/26/2022 with worsening dyspnea and hypoxia, felt multifactorial from CHF and COPD exacerbation. ? ?Assessment & Plan  ?  ?Marland Kitchen Acute on chronic hypoxic respiratory failure/Acute on chronic HFrEF ?- Repeat limited echo shows EF 30-35%, relatively consistent with last admission's EF of 35% in 12/2021 presentation is a combination of an acute CHF exacerbation and COPD exacerbation ?- Weights variable, [phone], I/O's incomplete therefore not reliable (recent dc weight 196, then recent office weight 193) ?- Remains SOB at rest - currently on IV Lasix 40mg  BID with further rise in BUN/Cr to 70/2.73 ?- will discuss next steps with MD - otherwise on isordil, carvedilol (?change to more selective BB given COPD); not on ACEI/ARB/ARNI/spiro d/t renal dysfunction ?  ?2. CAD ?- He is s/p multiple stents with most recent cath in 2013 showing EF of 40% with DESx2 to LAD. He did have an NSTEMI in 12/2021 with EF of 35% and medical management was recommended given his renal function. Hs Troponin values are low/flat this admission (peak 40), not consistent with ACS ?- By review of notes, he was not interested in a cardiac catheterization at time of his visit with Dr. [name] given the high risk of contrast-induced nephropathy.  ?- Continue Plavix 75mg  daily, Coreg 12.5mg  BID and Crestor 10mg  daily - not on ASA due to concomitant Plavix + Eliquis ?  ?3. Paroxysmal Atrial Fibrillation, also frequent PVCs ?- maintaining NSR albeit with very frequent PVCs, bigeminy and one 12 beat run of NSVT ?- K and Mg are currently optimized ?- Continue Eliquis 2.5mg  BID which is the appropriate dose given his age and renal function ?- ? Whether  PVCs are contributing to worsening cardiomyopathy and require escalation of medical therapy, though baseline QT is borderline prolonged. Will review regimen with MD ?  ?4. HLD ?- FLP in 12/2021 showed his LDL was at

## 2022-01-30 ENCOUNTER — Institutional Professional Consult (permissible substitution): Payer: Medicare Other | Admitting: Primary Care

## 2022-01-30 DIAGNOSIS — J441 Chronic obstructive pulmonary disease with (acute) exacerbation: Secondary | ICD-10-CM | POA: Diagnosis not present

## 2022-01-30 DIAGNOSIS — J9621 Acute and chronic respiratory failure with hypoxia: Secondary | ICD-10-CM | POA: Diagnosis not present

## 2022-01-30 DIAGNOSIS — R339 Retention of urine, unspecified: Secondary | ICD-10-CM

## 2022-01-30 DIAGNOSIS — N184 Chronic kidney disease, stage 4 (severe): Secondary | ICD-10-CM | POA: Diagnosis not present

## 2022-01-30 DIAGNOSIS — R338 Other retention of urine: Secondary | ICD-10-CM

## 2022-01-30 DIAGNOSIS — I2510 Atherosclerotic heart disease of native coronary artery without angina pectoris: Secondary | ICD-10-CM | POA: Diagnosis not present

## 2022-01-30 DIAGNOSIS — J9622 Acute and chronic respiratory failure with hypercapnia: Secondary | ICD-10-CM | POA: Diagnosis not present

## 2022-01-30 DIAGNOSIS — I5043 Acute on chronic combined systolic (congestive) and diastolic (congestive) heart failure: Secondary | ICD-10-CM | POA: Diagnosis not present

## 2022-01-30 LAB — BASIC METABOLIC PANEL
Anion gap: 10 (ref 5–15)
BUN: 75 mg/dL — ABNORMAL HIGH (ref 8–23)
CO2: 31 mmol/L (ref 22–32)
Calcium: 8.6 mg/dL — ABNORMAL LOW (ref 8.9–10.3)
Chloride: 96 mmol/L — ABNORMAL LOW (ref 98–111)
Creatinine, Ser: 2.26 mg/dL — ABNORMAL HIGH (ref 0.61–1.24)
GFR, Estimated: 28 mL/min — ABNORMAL LOW (ref >60)
Glucose, Bld: 230 mg/dL — ABNORMAL HIGH (ref 70–99)
Potassium: 4.9 mmol/L (ref 3.5–5.1)
Sodium: 137 mmol/L (ref 135–145)

## 2022-01-30 LAB — GLUCOSE, CAPILLARY
Glucose-Capillary: 216 mg/dL — ABNORMAL HIGH (ref 70–99)
Glucose-Capillary: 209 mg/dL — ABNORMAL HIGH (ref 70–99)
Glucose-Capillary: 200 mg/dL — ABNORMAL HIGH (ref 70–99)
Glucose-Capillary: 106 mg/dL — ABNORMAL HIGH (ref 70–99)
Glucose-Capillary: 205 mg/dL — ABNORMAL HIGH (ref 70–99)

## 2022-01-30 LAB — CBC
HCT: 31.1 % — ABNORMAL LOW (ref 39.0–52.0)
Hemoglobin: 9.8 g/dL — ABNORMAL LOW (ref 13.0–17.0)
MCH: 29.3 pg (ref 26.0–34.0)
MCHC: 31.5 g/dL (ref 30.0–36.0)
MCV: 92.8 fL (ref 80.0–100.0)
Platelets: 260 10*3/uL (ref 150–400)
RBC: 3.35 MIL/uL — ABNORMAL LOW (ref 4.22–5.81)
RDW: 15.4 % (ref 11.5–15.5)
WBC: 8.7 10*3/uL (ref 4.0–10.5)
nRBC: 0.2 % (ref 0.0–0.2)

## 2022-01-30 LAB — MAGNESIUM: Magnesium: 2.4 mg/dL (ref 1.7–2.4)

## 2022-01-30 MED ORDER — INSULIN GLARGINE-YFGN 100 UNIT/ML ~~LOC~~ SOLN
28.0000 [IU] | Freq: Every day | SUBCUTANEOUS | Status: DC
  Administered 2022-01-30 – 2022-02-03 (×4): 28 [IU] via SUBCUTANEOUS
  Filled 2022-01-30 (×6): qty 0.28

## 2022-01-30 MED ORDER — METOPROLOL SUCCINATE ER 50 MG PO TB24
50.0000 mg | ORAL_TABLET | Freq: Every day | ORAL | Status: DC
  Administered 2022-01-30 – 2022-02-06 (×8): 50 mg via ORAL
  Filled 2022-01-30 (×8): qty 1

## 2022-01-30 MED ORDER — IPRATROPIUM-ALBUTEROL 0.5-2.5 (3) MG/3ML IN SOLN
3.0000 mL | Freq: Three times a day (TID) | RESPIRATORY_TRACT | Status: DC
Start: 2022-01-30 — End: 2022-02-06
  Administered 2022-01-30 – 2022-02-06 (×22): 3 mL via RESPIRATORY_TRACT
  Filled 2022-01-30: qty 3
  Filled 2022-01-30: qty 6
  Filled 2022-01-30 (×5): qty 3
  Filled 2022-01-30: qty 6
  Filled 2022-01-30 (×11): qty 3

## 2022-01-30 MED ORDER — METHYLPREDNISOLONE SODIUM SUCC 40 MG IJ SOLR
40.0000 mg | Freq: Every day | INTRAMUSCULAR | Status: DC
  Administered 2022-01-31: 40 mg via INTRAVENOUS
  Filled 2022-01-30: qty 1

## 2022-01-30 MED ORDER — FUROSEMIDE 10 MG/ML IJ SOLN
80.0000 mg | Freq: Two times a day (BID) | INTRAMUSCULAR | Status: DC
Start: 2022-01-30 — End: 2022-02-01
  Administered 2022-01-30 – 2022-02-01 (×4): 80 mg via INTRAVENOUS
  Filled 2022-01-30 (×4): qty 8

## 2022-01-30 NOTE — Progress Notes (Signed)
Pt has been on and off BiPAP all day. Pt oxygen saturations are WNL when on nasal cannula, yet patient gets anxious and feels like he cannot breathe well so he requests to be placed back on BiPAP after a short time of being off of it.  ? ?Pulmonologist made aware, will continue to monitor.  ?

## 2022-01-30 NOTE — Progress Notes (Signed)
**Note De-Identified  Obfuscation** Patient removed from BIPAP and placed on 3L [place]; tolerating well at this time.  RRT to continue to monitor. ?

## 2022-01-30 NOTE — Progress Notes (Signed)
Inpatient Diabetes Program Recommendations ? ?AACE/ADA: New Consensus Statement on Inpatient Glycemic Control  ?Target Ranges:  Prepandial:   less than 140 mg/dL ?     Peak postprandial:   less than 180 mg/dL (1-2 hours) ?     Critically ill patients:  140 - 180 mg/dL  ? ? Latest Reference Range & Units 01/30/22 06:14 01/30/22 07:34  ?Glucose-Capillary 70 - 99 mg/dL 216 (H) 209 (H)  ? ? Latest Reference Range & Units 01/29/22 07:28 01/29/22 11:12 01/29/22 16:23 01/29/22 21:17  ?Glucose-Capillary 70 - 99 mg/dL 276 (H) 330 (H) 312 (H) 198 (H)  ? ?Review of Glycemic Control ? ?Diabetes history: DM2 ?Outpatient Diabetes medications: Lantus 50 units daily ?Current orders for Inpatient glycemic control: Semglee 25 units QHS, Novolog 7 units TID with meals, Novolog 0-9 units TID with meals, Novolog 0-5 units QHS; Solumedrol 40 mg Q12H ?  ?Inpatient Diabetes Program Recommendations:   ?  ?Insulin: If steroids are continued as ordered, please consider increasing Semglee to 28 units QHS. ? ?Thanks, ?Barnie Alderman, RN, MSN, CDE ?Diabetes Coordinator ?Inpatient Diabetes Program ?[phone] (Team Pager from 8am to 5pm) ? ?

## 2022-01-30 NOTE — Assessment & Plan Note (Signed)
01/30/22--I&O x 1 removing 781 cc ?-place foley if continues to retain ?

## 2022-01-30 NOTE — Progress Notes (Addendum)
? ?Progress Note ? ?Patient Name: John [name] ?Date of Encounter: 01/30/2022 ? ?Primary Cardiologist: Kirk Ruths, MD ? ?Subjective  ? ?Remains SOB at rest with orthopnea. No CP or edema. Pulm consult reviewed yesterday. CT suggestive of continued pulmonary edema/pleural effusions. ? ?VS not yet crossing over into EMR but HR 80, BP 136/76, Pox 93% on 3L [place] ? ?Inpatient Medications  ?  ?Scheduled Meds: ? apixaban  2.5 mg Oral BID  ? arformoterol  15 mcg Nebulization BID  ? budesonide (PULMICORT) nebulizer solution  0.5 mg Nebulization BID  ? carvedilol  12.5 mg Oral BID WC  ? chlorhexidine  15 mL Mouth Rinse BID  ? Chlorhexidine Gluconate Cloth  6 each Topical Q0600  ? clopidogrel  75 mg Oral Daily  ? furosemide  40 mg Intravenous Q12H  ? gabapentin  300 mg Oral QHS  ? insulin aspart  0-5 Units Subcutaneous QHS  ? insulin aspart  0-9 Units Subcutaneous TID WC  ? insulin aspart  7 Units Subcutaneous TID WC  ? insulin glargine-yfgn  25 Units Subcutaneous QHS  ? ipratropium-albuterol  3 mL Nebulization TID  ? isosorbide dinitrate  20 mg Oral BID  ? mouth rinse  15 mL Mouth Rinse q12n4p  ? methylPREDNISolone (SOLU-MEDROL) injection  40 mg Intravenous Q12H  ? pantoprazole  40 mg Oral Q breakfast  ? rosuvastatin  10 mg Oral QPM  ? tamsulosin  0.4 mg Oral Daily  ? ?Continuous Infusions: ? sodium chloride Stopped (01/26/22 2157)  ? azithromycin 250 mL/hr at 01/29/22 1415  ? ?PRN Meds: ?sodium chloride, acetaminophen, alum & mag hydroxide-simeth, ipratropium-albuterol  ? ?Vital Signs  ?  ?Vitals:  ? 01/29/22 2300 01/30/22 0000 01/30/22 0038 01/30/22 0427  ?BP: (!) 146/76 (!) 153/80    ?Pulse: 69 71    ?Resp: 18 18    ?Temp:   97.7 ?F (36.5 ?C) 97.7 ?F (36.5 ?C)  ?TempSrc:   Oral Oral  ?SpO2: 97% 97%    ?Weight:    88 kg  ?Height:      ? ? ?Intake/Output Summary (Last 24 hours) at 01/30/2022 0751 ?Last data filed at 01/30/2022 0428 ?Gross per 24 hour  ?Intake 759.97 ml  ?Output 1825 ml  ?Net -1065.03 ml  ? ? ?  01/30/2022  ?   4:27 AM 01/29/2022  ?  5:00 AM 01/28/2022  ?  5:00 AM  ?Last 3 Weights  ?Weight (lbs) 194 lb 0.1 oz 195 lb 12.3 oz 198 lb 6.6 oz  ?Weight (kg) 88 kg 88.8 kg 90 kg  ?  ? ?Telemetry  ?  ?NSR with frequent PVCs, one 12 beat run of NSVT and one short run SVT - Personally Reviewed ? ?Physical Exam  ? ?GEN: No acute distress.  ?HEENT: Normocephalic, atraumatic, sclera non-icteric. ?Neck: No JVD or bruits. ?Cardiac: RRR no murmurs, rubs, or gallops.  ?Respiratory: Decreased at bases bilaterally, no rales, wheezing or rhonchi. Breathing is unlabored at rest but he does become more SOB with even the action of sitting up in bed. ?GI: Soft, nontender, non-distended, BS +x 4. ?MS: no deformity. ?Extremities: No clubbing or cyanosis. No edema. Distal pedal pulses are 2+ and equal bilaterally. ?Neuro:  AAOx3. Follows commands. ?Psych:  Responds to questions appropriately with a normal affect. ? ?Labs  ?  ?High Sensitivity Troponin:   ?Recent Labs  ?Lab 01/08/22 ?2352 01/26/22 ?0432 01/26/22 ?1191 01/27/22 ?4782 01/27/22 ?9562  ?TROPONINIHS 118* 40* 39* 36* 39*  ?   ? ?Cardiac EnzymesNo results  for input(s): TROPONINI in the last 168 hours. No results for input(s): TROPIPOC in the last 168 hours.  ? ?Chemistry ?Recent Labs  ?Lab 01/26/22 ?0432 01/27/22 ?6734 01/27/22 ?1937 01/28/22 ?9024 01/29/22 ?0973 01/30/22 ?0419  ?NA 136   < > 135 136 134* 137  ?K 4.1   < > 3.9 4.4 4.3 4.9  ?CL 98   < > 99 96* 96* 96*  ?CO2 31   < > 28 30 30 31   ?GLUCOSE 228*   < > 256* 273* 289* 230*  ?BUN 33*   < > 44* 62* 70* 75*  ?CREATININE 2.17*   < > 2.22* 2.40* 2.73* 2.26*  ?CALCIUM 8.5*   < > 8.3* 8.6* 8.3* 8.6*  ?PROT 6.8  --  5.7*  --   --   --   ?ALBUMIN 3.5  --  3.0*  --   --   --   ?AST 24  --  19  --   --   --   ?ALT 25  --  20  --   --   --   ?ALKPHOS 77  --  58  --   --   --   ?BILITOT 0.8  --  0.6  --   --   --   ?GFRNONAA 29*   < > 29* 26* 22* 28*  ?ANIONGAP 7   < > 8 10 8 10   ? < > = values in this interval not displayed.  ?   ? ?Hematology ?Recent Labs  ?Lab 01/26/22 ?5329 01/27/22 ?0313 01/30/22 ?0419  ?WBC 9.0 6.5 8.7  ?RBC 3.70* 3.04* 3.35*  ?HGB 10.8* 8.7* 9.8*  ?HCT 35.5* 28.3* 31.1*  ?MCV 95.9 93.1 92.8  ?MCH 29.2 28.6 29.3  ?MCHC 30.4 30.7 31.5  ?RDW 15.8* 15.6* 15.4  ?PLT 212 165 260  ? ? ?BNP ?Recent Labs  ?Lab 01/26/22 ?0432 01/29/22 ?0418  ?BNP 710.0* 968.0*  ?  ? ?DDimer No results for input(s): DDIMER in the last 168 hours.  ? ?Radiology  ?  ?CT Chest High Resolution ? ?Result Date: 01/29/2022 ?CLINICAL DATA:  Interstitial lung disease, recent pneumonia EXAM: CT CHEST WITHOUT CONTRAST TECHNIQUE: Multidetector CT imaging of the chest was performed following the standard protocol without intravenous contrast. High resolution imaging of the lungs, as well as inspiratory and expiratory imaging, was performed. RADIATION DOSE REDUCTION: This exam was performed according to the departmental dose-optimization program which includes automated exposure control, adjustment of the mA and/or kV according to patient size and/or use of iterative reconstruction technique. COMPARISON:  CT chest, abdomen and pelvis dated December 24, 2021 FINDINGS: Cardiovascular: Normal heart size. No pericardial effusion. Left main and three-vessel coronary artery calcifications. Atherosclerotic disease of the thoracic aorta. Mediastinum/Nodes: Esophagus and thyroid are unremarkable. No pathologically enlarged lymph nodes seen in the chest. Lungs/Pleura: Central airways are patent. No significant air trapping. Bilateral bronchial wall thickening and interlobular septal thickening. Moderate right and small left pleural effusions with associated atelectasis. Upper Abdomen: No acute abnormality. Musculoskeletal: No chest wall mass or suspicious bone lesions identified. IMPRESSION: 1. Interval decreased ground-glass opacities and airspace consolidations, possibly due resolving infectious/inflammatory process or decreased pulmonary edema. 2. Persistent bilateral  bronchial wall thickening and interlobular septal thickening, likely due to pulmonary edema. 3. Moderate right and small left pleural effusions with associated atelectasis, increased when compared with prior exam. 4. Evaluation for interstitial lung disease is limited due to presence of pulmonary edema and pleural effusions. If there is continued clinical  concern, repeat ILD protocol CT could be performed after resolution of acute findings. 5. Aortic Atherosclerosis (ICD10-I70.0). Electronically Signed   By: Yetta Glassman M.D.   On: 01/29/2022 16:47   ? ?Cardiac Studies  ? ?Limited echo 01/26/22 ? 1. Left ventricular ejection fraction, by estimation, is 30 to 35%. The  ?left ventricle has moderately decreased function. The left ventricle  ?demonstrates global hypokinesis. The left ventricular internal cavity size  ?was mildly to moderately dilated.  ?There is mild left ventricular hypertrophy.  ? 2. Right ventricular systolic function is normal. The right ventricular  ?size is normal.  ? 3. The inferior vena cava is dilated in size with >50% respiratory  ?variability, suggesting right atrial pressure of 8 mmHg.  ? ?Comparison(s): The left ventricular function is worsened.  ?  ?Full echo 12/25/21 ? ? 1. Left ventricular ejection fraction, by estimation, is 35%. The left  ?ventricle has moderately decreased function. The left ventricle  ?demonstrates global hypokinesis. The left ventricular internal cavity size  ?was mildly dilated. There is mild left  ?ventricular hypertrophy. Left ventricular diastolic parameters are  ?consistent with Grade II diastolic dysfunction (pseudonormalization).  ? 2. Right ventricular systolic function is normal. The right ventricular  ?size is normal. Tricuspid regurgitation signal is inadequate for assessing  ?PA pressure.  ? 3. The mitral valve is normal in structure. Trivial mitral valve  ?regurgitation. No evidence of mitral stenosis.  ? 4. The aortic valve is tricuspid. There is mild  calcification of the  ?aortic valve. Aortic valve regurgitation is not visualized. No aortic  ?stenosis is present.  ? ?Patient Profile  ?   ?81 y.o. male with CAD (s/p multiple stents with most recent cath in 2013 sho

## 2022-01-30 NOTE — Progress Notes (Signed)
? ?NAME:  John [name], MRN:  [id number], DOB:  [birth year], LOS: 4 ?ADMISSION DATE:  01/26/2022, CONSULTATION DATE:  01/30/2022 ? ?REFERRING MD:  Tat, TRH , CHIEF COMPLAINT: Respiratory failure on BiPAP ? ?History of Present Illness:  ?81 year old ex-smoker admitted 4/7 for shortness of breath, respiratory distress and found to be hypoxic to 65% on room air, initially required nonrebreather and then BiPAP.  Initial labs significant for no leukocytosis, hemoglobin 10.8, creatinine 2.2 .  Chest x-ray showed CHF superimposed on hyperinflation.  BNP was 710 ?PCCM consulted on 4/10 due to persistent BiPAP requirement ? ?Recent admission 5/46/2703 for acute systolic heart failure and NSTEMI, cardiac cath deferred due to AKI, peak creatinine 3.8.  His shortness of breath persisted after admission and gradually worsened ?I/O shows +2.5 L ? 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 ?Significant Hospital Events: ?Including procedures, antibiotic start and stop dates in addition to other pertinent events   ?Echo 4/7 EF 30 to 35%, global hypokinesis, grade 2 diastolic dysfunction ?CT chest/abdomen/pelvis 12/24/2021 severe multilobar bronchopneumonia With small bilateral effusions ?HRCT/10/23 decreased bilateral consolidations , evidence of pulmonary edema with bilateral right more than left effusions, cannot evaluate for ILD ? ?Interim History / Subjective:  ?Use BiPAP overnight, back on nasal cannula this morning, got put back on BiPAP around 2 PM due to subjective distress ?Diuresed 1.8 L ? ?Objective   ?Blood pressure (!) 161/114, pulse 79, temperature 97.7 ?F (36.5 ?C), temperature source Oral, resp. rate (!) 25, height 5\' 7"  (1.702 m),  weight 88 kg, SpO2 96 %. ?   ?FiO2 (%):  [45 %] 45 %  ? ?Intake/Output Summary (Last 24 hours) at 01/30/2022 1432 ?Last data filed at 01/30/2022 1347 ?Gross per 24 hour  ?Intake 380 ml  ?Output 3350 ml  ?Net -2970 ml  ? ? ?Filed Weights  ? 01/28/22 0500 01/29/22 0500 01/30/22 0427  ?Weight: 90 kg 88.8 kg 88 kg  ? ? ?Examination: ?General: Elderly man sitting up in bed, on BiPAP fullface mask, no distress ?HENT: Mild pallor, no icterus, no JVD ?Lungs: No accessory muscle use, bibasilar dry crackles ?Cardiovascular: S1-S2 regular, ejection systolic murmur 2/ 6 at apex ?Abdomen: Soft, nontender abdomen, no fluid thrill ?Extremities: No edema, no deformity ?Neuro: No asterixis, alert and interactive, able to speak in full sentences on BiPAP ?GU: Clear urine ? ?Chest x-ray 4/7 independently reviewed which shows small right effusion and bilateral interstitial infiltrates ? ?Resolved Hospital Problem list   ? ? ?Assessment & Plan:  ?Acute hypoxic respiratory failure ?Acute on chronic systolic heart failure ??  Underlying COPD ?Recent bronchopneumonia 12/2021 , appears improved , no evidence of ILD on HRCT ?-He has persistent dyspnea especially on minimal exertion even though saturations remained stable on 4 L nasal cannula ?-He appears dry but has bibasal crackles, elevated BNP and positive balance ? ?Recommend ?-Continue diuresis as tolerated by renal function ?-Continue budesonide and arformoterol nebs , no evidence of bronchospasm so decrease Solu-Medrol 40 daily ?-Goal-directed therapy for heart failure per cardiology ?-Use BiPAP as needed for work of breathing ?-Can consider thoracentesis if right effusion persists in spite of adequate diuresis ? ?Best Practice (right click and "Reselect all SmartList Selections" daily)  ? ?Diet/type:  Regular consistency (see orders) ?DVT prophylaxis: DOAC ?GI prophylaxis: N/A ?Lines: N/A ?Foley:  N/A ?Code Status:  DNR ?Last date of multidisciplinary goals of care discussion [ongoing  with patient and daughter] ? ?Labs   ?CBC: ?Recent Labs  ?Lab 01/26/22 ?9024 01/27/22 ?0313 01/30/22 ?0419  ?WBC 9.0 6.5 8.7  ?NEUTROABS 6.6  --   --   ?HGB 10.8* 8.7* 9.8*  ?HCT 35.5* 28.3* 31.1*  ?MCV 95.9 93.1 92.8  ?PLT 212 165 260  ? ? ? ?Basic Metabolic Panel: ?Recent Labs  ?Lab 01/26/22 ?0973 01/27/22 ?5329 01/27/22 ?9242 01/28/22 ?6834 01/29/22 ?1962 01/30/22 ?0419  ?NA  --  136 135 136 134* 137  ?K  --  4.1 3.9 4.4 4.3 4.9  ?CL  --  98 99 96* 96* 96*  ?CO2  --  29 28 30 30 31   ?GLUCOSE  --  302* 256* 273* 289* 230*  ?BUN  --  45* 44* 62* 70* 75*  ?CREATININE  --  2.36* 2.22* 2.40* 2.73* 2.26*  ?CALCIUM  --  8.4* 8.3* 8.6* 8.3* 8.6*  ?MG 2.2 2.1 2.0 2.2 2.4 2.4  ?PHOS 4.5  --   --   --   --   --   ? ? ?GFR: ?Estimated Creatinine Clearance: 26.2 mL/min (A) (by C-G formula based on SCr of 2.26 mg/dL (H)). ?Recent Labs  ?Lab 01/26/22 ?0432 01/26/22 ?2297 01/27/22 ?0313 01/28/22 ?9892 01/30/22 ?0419  ?PROCALCITON  --  <0.10  --  <0.10  --   ?WBC 9.0  --  6.5  --  8.7  ? ? ? ?Liver Function Tests: ?Recent Labs  ?Lab 01/26/22 ?0432 01/27/22 ?0313  ?AST 24 19  ?ALT 25 20  ?ALKPHOS 77 58  ?BILITOT 0.8 0.6  ?PROT 6.8 5.7*  ?ALBUMIN 3.5 3.0*  ? ? ?No results for input(s): LIPASE, AMYLASE in the last 168 hours. ?No results for input(s): AMMONIA in the last 168 hours. ? ?ABG ?   ?Component Value Date/Time  ? PHART 7.42 12/24/2021 1458  ? PCO2ART 42 12/24/2021 1458  ? PO2ART 77 (L) 12/24/2021 1458  ? HCO3 34.8 (H) 01/26/2022 0432  ? TCO2 23 06/25/2013 0936  ? ACIDBASEDEF 0.7 12/24/2021 0746  ? O2SAT 95.5 01/26/2022 0432  ? ?  ? ?Coagulation Profile: ?No results for input(s): INR, PROTIME in the last 168 hours. ? ?Cardiac Enzymes: ?No results for input(s): CKTOTAL, CKMB, CKMBINDEX, TROPONINI in the last 168 hours. ? ?HbA1C: ?Hgb A1c MFr Bld  ?Date/Time Value Ref Range Status  ?12/24/2021 07:46 AM 7.1 (H) 4.8 - 5.6 % Final  ?  Comment:  ?  (NOTE) ?Pre diabetes:          5.7%-6.4% ? ?Diabetes:              >6.4% ? ?Glycemic  control for   <7.0% ?adults with diabetes ?  ?09/13/2021 07:56 AM 8.7 (H) 4.8 - 5.6 % Final  ?  Comment:  ?  (NOTE) ?Pre diabetes:          5.7%-6.4% ? ?Diabetes:              >6.4% ? ?Glycemic control for   <7.0% ?adults with diabetes ?  ? ? ?CBG: ?Recent Labs  ?Lab 01/29/22 ?1623 01/29/22 ?2117 01/30/22 ?1194 01/30/22 ?0734 01/30/22 ?1128  ?GLUCAP 312* 198* 216* 209* 200*  ? ? ? ? ?Kara Mead MD. Shade Flood. ?[address] [facility] ?Pager : [phone] ? ?If no response to pager , please call [phone] until 7 pm ?After 7:00 pm call Elink  [phone]  ? ?01/30/2022 ? ?

## 2022-01-30 NOTE — Progress Notes (Addendum)
?  ?       ?PROGRESS NOTE ? ?John [name] ZTI:[id number] DOB: 09/16/1941 DOA: 01/26/2022 ?PCP: [name], DO ? ?Brief History:  ?81 year old male with a history of recent NSTEMI 12/2021, diabetes mellitus type 2, hyperlipidemia, hypertension, paroxysmal atrial fibrillation, GERD, BPH, and CKD stage III presenting with shortness of breath and respiratory distress.  The patient presented with oxygen saturation 65% on room air.  He was initially placed on nonrebreather.  He was subsequently placed on BiPAP in the emergency department. ?He was recently admitted to the hospital from 01/08/2022 to 01/10/2022 for acute on chronic systolic and diastolic CHF.  He was discharged home with torsemide 20 mg daily.  He has been compliant with his medications and fluid restriction.  In fact, he has had decreased oral intake.  He has only gained 1 pound since discharge from the hospital.  His last weight was 194 pounds on the day of admission.  In addition, the patient also had a hospital admission from 12/24/2021 to 12/29/2021 for which he was treated medically for NSTEMI.  His troponin peaked >24K.  he was discharged home on Isordil, hydralazine, carvedilol, Crestor, and Plavix. ?Since discharge home from the hospital on 01/11/2022, the patient had remained short of breath with exertion.  His daughter states that he has essentially gradually declined since discharge.  After participating with occupational therapy on 01/25/2022, the patient became more short of breath as the day progressed.  He has not had any fevers, chills, headache, neck pain, chest pain, coughing, hemoptysis, nausea, vomiting or diarrhea. ?In the ED, the patient was afebrile hemodynamically stable with oxygen saturation 98-100% on BiPAP.  Troponins were flat 39>>40.  Initial labs showed WBC 9.0, Hgb 10.8, platelets 212, Na+ 136, K+ 4.1 and creatinine 2.17. AST 24 and ALT 25. Initial and repeat Hs Troponin flat at 40 and 39. BNP not yet ordered but will add-on to AM  labs. 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 EKG showed sinus rhythm with PVCs.  The patient was started on IV furosemide, bronchodilators, IV steroids, and azithromycin.  Cardiology and PCCM were consulted to assist  ? ? ? ?Assessment and Plan: ?* Acute on chronic respiratory failure with hypoxia and hypercapnia (HCC) ?Secondary to COPD exacerbation and CHF exacerbation ?At baseline, patient is on 3 L nasal cannula ?Weaned off BiPAP>>4L ?Wean oxygen for saturation greater 90% ?Intermittently requiring BiPAP throughout the day and at hs ?Little functional reserve ?--noncompliant with CPAP at home ?Appreciate PCCM consult>>use BiPAP for increase WOB prn and wean ?4/11 High Res CT chest>>Persistent bilateral bronchial wall thickening and interlobularseptal thickening, likely due to pulmonary edema. ? ?COPD exacerbation ([place]) ?Continue Pulmicort ?Continue brovana ?Continue DuoNebs ?Continue IV Solu-Medrol>>decreased to once daily ?Intermittently requiring BiPAP during day and at hs ?Appreciate PCCM ? ?Acute on chronic combined systolic and diastolic CHF (congestive heart failure) ([place]) ?3623 echo EF 35%, global HK, grade 2 DD ?Continue IV furosemide per cardiology ?Daily weights--not accurate ?Accurate I's and O's-=-incomplete ?4/11 High Res CT chest>>Persistent bilateral bronchial wall thickening and interlobularseptal thickening, likely due to pulmonary edema. ?Appreciate cardiology consult>>increase lasix to 80 mg IV bid ? ?CKD (chronic kidney disease), stage IV ([place]) ?Baseline creatinine 2.0-2.3 ?Monitor with diuresis ? ?Paroxysmal atrial fibrillation (HCC) ?Continue apixaban ?Continue carvedilol>>changed to metoprolol ?Optimize potassium ?Currently in sinus ? ?Coronary atherosclerosis of native coronary artery ?No chest pain presently ?Personally reviewed EKG--sinus, LBBB, nonspecific STT ?Troponins are flat 39>>40>>36>>39 ?Continue Plavix, Crestor, carvedilol ? ?Urine retention ?01/30/22--I&O x 1 removing 781  cc ?-place foley if continues to retain ? ?BPH (benign prostatic hyperplasia) ?Continue tamulosin ? ?Elevated troponin ?Trends are flat ?No chest pain ?Personally reviewed EKG--sinus, LBBB, nonspecific STT ?Secondary to demand ischemia ?4/7 limited echo--EF 30-35%, trivial pericardial effusion, global HK ?Appreciate cardiology consult ? ?Type 2 diabetes mellitus with hyperglycemia (HCC) ?12/24/2021 hemoglobin A1c 7.1 ?NovoLog sliding scale ?increase dose Semglee to 28 units ?Increase novolog 7 units with meals ? ? ?Essential hypertension ?Continue  Isordil ?Coreg changed to metoprolol ? ?Peripheral vascular disease ([place]) ?- He is s/p right fem-pop bypass at [facility] in 05/2018. Remains on Plavix and statin therapy.  ? ?Mixed hyperlipidemia ?Continue statin ? ?Status is: Inpatient ?Remains inpatient appropriate because: severity of illness requiring BiPAP and IV lasix ?  ?  ?  ?Family Communication:   daughter updated at bedside 4/9 ?  ?Consultants:  cardiology ?  ?Code Status:  DNR ?  ?DVT Prophylaxis: apixaban ?  ?  ?Procedures: ?As Listed in Progress Note Above ?  ?Antibiotics: ?Azthro 4/7>>4/9 ?  ? ? ?Subjective: ?Pt complains of sob with minimal exertion.  Denies cp, f/c, n/v/d ,abd pain. ? ?Objective: ?Vitals:  ? 01/30/22 1400 01/30/22 1440 01/30/22 1500 01/30/22 1600  ?BP: (!) 161/114  138/67 (!) 155/77  ?Pulse: 79 64 65 (!) 59  ?Resp: (!) 25 14 17 12   ?Temp:      ?TempSrc:      ?SpO2: 96% 100% 99% 100%  ?Weight:      ?Height:      ? ? ?Intake/Output Summary (Last 24 hours) at 01/30/2022 1646 ?Last data filed at 01/30/2022 1347 ?Gross per 24 hour  ?Intake 380 ml  ?Output 3350 ml  ?Net -2970 ml  ? ?Weight change: -0.8 kg ?Exam: ? ?General:  Pt is alert, follows commands appropriately, not in acute distress ?HEENT: No icterus, No thrush, No neck mass, /AT ?Cardiovascular: RRR, S1/S2, no rubs, no gallops ?Respiratory: bibasilar crackles. No wheeze ?Abdomen: Soft/+BS, non tender, non distended, no  guarding ?Extremities: No edema, No lymphangitis, No petechiae, No rashes, no synovitis ? ? ?Data Reviewed: ?I have personally reviewed following labs and imaging studies ?Basic Metabolic Panel: ?Recent Labs  ?Lab 01/26/22 ?5643 01/27/22 ?3295 01/27/22 ?1884 01/28/22 ?1660 01/29/22 ?6301 01/30/22 ?0419  ?NA  --  136 135 136 134* 137  ?K  --  4.1 3.9 4.4 4.3 4.9  ?CL  --  98 99 96* 96* 96*  ?CO2  --  29 28 30 30 31   ?GLUCOSE  --  302* 256* 273* 289* 230*  ?BUN  --  45* 44* 62* 70* 75*  ?CREATININE  --  2.36* 2.22* 2.40* 2.73* 2.26*  ?CALCIUM  --  8.4* 8.3* 8.6* 8.3* 8.6*  ?MG 2.2 2.1 2.0 2.2 2.4 2.4  ?PHOS 4.5  --   --   --   --   --   ? ?Liver Function Tests: ?Recent Labs  ?Lab 01/26/22 ?0432 01/27/22 ?0313  ?AST 24 19  ?ALT 25 20  ?ALKPHOS 77 58  ?BILITOT 0.8 0.6  ?PROT 6.8 5.7*  ?ALBUMIN 3.5 3.0*  ? ?No results for input(s): LIPASE, AMYLASE in the last 168 hours. ?No results for input(s): AMMONIA in the last 168 hours. ?Coagulation Profile: ?No results for input(s): INR, PROTIME in the last 168 hours. ?CBC: ?Recent Labs  ?Lab 01/26/22 ?6010 01/27/22 ?0313 01/30/22 ?0419  ?WBC 9.0 6.5 8.7  ?NEUTROABS 6.6  --   --   ?HGB 10.8* 8.7* 9.8*  ?HCT 35.5* 28.3* 31.1*  ?  MCV 95.9 93.1 92.8  ?PLT 212 165 260  ? ?Cardiac Enzymes: ?No results for input(s): CKTOTAL, CKMB, CKMBINDEX, TROPONINI in the last 168 hours. ?BNP: ?Invalid input(s): POCBNP ?CBG: ?Recent Labs  ?Lab 01/29/22 ?2117 01/30/22 ?8768 01/30/22 ?0734 01/30/22 ?1128 01/30/22 ?1611  ?GLUCAP 198* 216* 209* 200* 106*  ? ?HbA1C: ?No results for input(s): HGBA1C in the last 72 hours. ?Urine analysis: ?   ?Component Value Date/Time  ? [place] YELLOW 01/26/2022 2100  ? APPEARANCEUR CLEAR 01/26/2022 2100  ? LABSPEC 1.011 01/26/2022 2100  ? PHURINE 5.0 01/26/2022 2100  ? GLUCOSEU 150 (A) 01/26/2022 2100  ? [place] NEGATIVE 01/26/2022 2100  ? [place] [address] NEGATIVE 01/26/2022 2100  ? [place] NEGATIVE 01/26/2022 2100  ? [place] NEGATIVE 01/26/2022 2100  ? NITRITE NEGATIVE  01/26/2022 2100  ? LEUKOCYTESUR NEGATIVE 01/26/2022 2100  ? ?Sepsis Labs: ?@LABRCNTIP (procalcitonin:4,lacticidven:4) ?) ?Recent Results (from the past 240 hour(s))  ?Respiratory (~20 pathogens) panel by PCR     Status: None

## 2022-01-31 ENCOUNTER — Inpatient Hospital Stay (HOSPITAL_COMMUNITY): Payer: Medicare Other

## 2022-01-31 DIAGNOSIS — J9621 Acute and chronic respiratory failure with hypoxia: Secondary | ICD-10-CM | POA: Diagnosis not present

## 2022-01-31 DIAGNOSIS — I5043 Acute on chronic combined systolic (congestive) and diastolic (congestive) heart failure: Secondary | ICD-10-CM | POA: Diagnosis not present

## 2022-01-31 DIAGNOSIS — J90 Pleural effusion, not elsewhere classified: Secondary | ICD-10-CM

## 2022-01-31 DIAGNOSIS — J9622 Acute and chronic respiratory failure with hypercapnia: Secondary | ICD-10-CM | POA: Diagnosis not present

## 2022-01-31 DIAGNOSIS — N184 Chronic kidney disease, stage 4 (severe): Secondary | ICD-10-CM | POA: Diagnosis not present

## 2022-01-31 LAB — BASIC METABOLIC PANEL
Anion gap: 11 (ref 5–15)
BUN: 77 mg/dL — ABNORMAL HIGH (ref 8–23)
CO2: 34 mmol/L — ABNORMAL HIGH (ref 22–32)
Calcium: 9.1 mg/dL (ref 8.9–10.3)
Chloride: 96 mmol/L — ABNORMAL LOW (ref 98–111)
Creatinine, Ser: 2.13 mg/dL — ABNORMAL HIGH (ref 0.61–1.24)
GFR, Estimated: 30 mL/min — ABNORMAL LOW (ref >60)
Glucose, Bld: 200 mg/dL — ABNORMAL HIGH (ref 70–99)
Potassium: 4.4 mmol/L (ref 3.5–5.1)
Sodium: 141 mmol/L (ref 135–145)

## 2022-01-31 LAB — BLOOD GAS, ARTERIAL
Acid-Base Excess: 11.6 mmol/L — ABNORMAL HIGH (ref 0.0–2.0)
Bicarbonate: 37.1 mmol/L — ABNORMAL HIGH (ref 20.0–28.0)
Drawn by: 22223
FIO2: 45 %
O2 Saturation: 96.1 %
Patient temperature: 37
pCO2 arterial: 51 mmHg — ABNORMAL HIGH (ref 32–48)
pH, Arterial: 7.47 — ABNORMAL HIGH (ref 7.35–7.45)
pO2, Arterial: 77 mmHg — ABNORMAL LOW (ref 83–108)

## 2022-01-31 LAB — GLUCOSE, CAPILLARY
Glucose-Capillary: 176 mg/dL — ABNORMAL HIGH (ref 70–99)
Glucose-Capillary: 164 mg/dL — ABNORMAL HIGH (ref 70–99)
Glucose-Capillary: 107 mg/dL — ABNORMAL HIGH (ref 70–99)
Glucose-Capillary: 228 mg/dL — ABNORMAL HIGH (ref 70–99)
Glucose-Capillary: 293 mg/dL — ABNORMAL HIGH (ref 70–99)

## 2022-01-31 LAB — CBC
HCT: 40.4 % (ref 39.0–52.0)
Hemoglobin: 12.2 g/dL — ABNORMAL LOW (ref 13.0–17.0)
MCH: 28.1 pg (ref 26.0–34.0)
MCHC: 30.2 g/dL (ref 30.0–36.0)
MCV: 93.1 fL (ref 80.0–100.0)
Platelets: 353 10*3/uL (ref 150–400)
RBC: 4.34 MIL/uL (ref 4.22–5.81)
RDW: 15.4 % (ref 11.5–15.5)
WBC: 13.8 10*3/uL — ABNORMAL HIGH (ref 4.0–10.5)
nRBC: 0.4 % — ABNORMAL HIGH (ref 0.0–0.2)

## 2022-01-31 LAB — MAGNESIUM: Magnesium: 2.4 mg/dL (ref 1.7–2.4)

## 2022-01-31 MED ORDER — HYDRALAZINE HCL 20 MG/ML IJ SOLN
10.0000 mg | Freq: Four times a day (QID) | INTRAMUSCULAR | Status: DC | PRN
Start: 2022-01-31 — End: 2022-02-06
  Administered 2022-01-31: 10 mg via INTRAVENOUS
  Filled 2022-01-31: qty 1

## 2022-01-31 MED ORDER — ACETAZOLAMIDE 250 MG PO TABS
500.0000 mg | ORAL_TABLET | Freq: Once | ORAL | Status: AC
  Administered 2022-01-31: 500 mg via ORAL
  Filled 2022-01-31: qty 2

## 2022-01-31 NOTE — Progress Notes (Signed)
?  ?       ?PROGRESS NOTE ? ?John [name] YQM:[id number] DOB: 11-10-1940 DOA: 01/26/2022 ?PCP: [name], DO ? ?Brief History:  ?81 year old male with a history of recent NSTEMI 12/2021, diabetes mellitus type 2, hyperlipidemia, hypertension, paroxysmal atrial fibrillation, GERD, BPH, and CKD stage III presenting with shortness of breath and respiratory distress.  The patient presented with oxygen saturation 65% on room air.  He was initially placed on nonrebreather.  He was subsequently placed on BiPAP in the emergency department. ?He was recently admitted to the hospital from 01/08/2022 to 01/10/2022 for acute on chronic systolic and diastolic CHF.  He was discharged home with torsemide 20 mg daily.  He has been compliant with his medications and fluid restriction.  In fact, he has had decreased oral intake.  He has only gained 1 pound since discharge from the hospital.  His last weight was 194 pounds on the day of admission.  In addition, the patient also had a hospital admission from 12/24/2021 to 12/29/2021 for which he was treated medically for NSTEMI.  His troponin peaked >24K.  he was discharged home on Isordil, hydralazine, carvedilol, Crestor, and Plavix. ?Since discharge home from the hospital on 01/11/2022, the patient had remained short of breath with exertion.  His daughter states that he has essentially gradually declined since discharge.  After participating with occupational therapy on 01/25/2022, the patient became more short of breath as the day progressed.  He has not had any fevers, chills, headache, neck pain, chest pain, coughing, hemoptysis, nausea, vomiting or diarrhea. ?In the ED, the patient was afebrile hemodynamically stable with oxygen saturation 98-100% on BiPAP.  Troponins were flat 39>>40.  Initial labs showed WBC 9.0, Hgb 10.8, platelets 212, Na+ 136, K+ 4.1 and creatinine 2.17. AST 24 and ALT 25. Initial and repeat Hs Troponin flat at 40 and 39. BNP not yet ordered but will add-on to AM  labs. 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 EKG showed sinus rhythm with PVCs.  The patient was started on IV furosemide, bronchodilators, IV steroids, and azithromycin.  Cardiology and PCCM were consulted to assist  ? ? ? ?Assessment and Plan: ?* Acute on chronic respiratory failure with hypoxia and hypercapnia (HCC) ?Secondary to COPD exacerbation and CHF exacerbation ?At baseline, patient is on 3 L nasal cannula ?Wean oxygen for saturation greater 90% ?Intermittently requiring BiPAP throughout the day and at hs ?Little functional reserve ?--noncompliant with CPAP at home ?Appreciate PCCM consult>>use BiPAP for increase WOB prn and wean ?4/11 High Res CT chest>>Persistent bilateral bronchial wall thickening and interlobularseptal thickening, likely due to pulmonary edema. ? ?COPD exacerbation ([place]) ?Continue Pulmicort ?Continue brovana ?Continue DuoNebs ?IV steroids discontinued since he was not wheezing ?Intermittently requiring BiPAP during day and at hs ?Appreciate PCCM ? ?Acute on chronic combined systolic and diastolic CHF (congestive heart failure) ([place]) ?3623 echo EF 35%, global HK, grade 2 DD ?Continue IV furosemide per cardiology ?Daily weights--not accurate ?Accurate I's and O's-=-incomplete ?4/11 High Res CT chest>>Persistent bilateral bronchial wall thickening and interlobularseptal thickening, likely due to pulmonary edema. ?Appreciate cardiology consult>>increase lasix to 80 mg IV bid ? ?Pleural effusion, right ?-Felt to be related to CHF ?-Thoracentesis has been ordered ? ?CKD (chronic kidney disease), stage IV ([place]) ?Baseline creatinine 2.0-2.3 ?Monitor with diuresis ? ?Paroxysmal atrial fibrillation (HCC) ?Anticoagulated with apixaban.  Anticoagulation currently on hold in anticipation for thoracentesis ?Carvedilol changed to Toprol-XL ?Optimize potassium ?Currently in sinus ? ?Coronary atherosclerosis of native coronary artery ?No chest pain presently ?  Personally reviewed EKG--sinus, LBBB,  nonspecific STT ?Troponins are flat 39>>40>>36>>39 ?Continue Plavix, Crestor, Toprol ? ?Urine retention ?01/30/22--I&O x 1 removing 781 cc ?-place foley if continues to retain ? ?BPH (benign prostatic hyperplasia) ?Continue tamulosin ? ?Elevated troponin ?Trends are flat ?No chest pain ?Personally reviewed EKG--sinus, LBBB, nonspecific STT ?Secondary to demand ischemia ?4/7 limited echo--EF 30-35%, trivial pericardial effusion, global HK ?Appreciate cardiology consult ? ?Type 2 diabetes mellitus with hyperglycemia (HCC) ?12/24/2021 hemoglobin A1c 7.1 ?NovoLog sliding scale ?increase dose Semglee to 28 units ?Increase novolog 7 units with meals ? ? ?Essential hypertension ?Continue  Isordil ?Coreg changed to metoprolol ? ?Peripheral vascular disease ([place]) ?- He is s/p right fem-pop bypass at [facility] in 05/2018. Remains on Plavix and statin therapy.  ? ?Mixed hyperlipidemia ?Continue statin ? ?Status is: Inpatient ?Remains inpatient appropriate because: severity of illness requiring BiPAP and IV lasix ?  ?  ?  ?Family Communication:   daughter updated at bedside 4/12 ?  ?Consultants:  cardiology, pulmonology ?  ?Code Status:  DNR ?  ?DVT Prophylaxis: apixaban ?  ?  ?Procedures: ?As Listed in Progress Note Above ?  ?Antibiotics: ?Azithro 4/7>>4/9 ?  ? ? ?Subjective: ?Becomes short of breath when off BiPAP.  Requested to have it replaced earlier today. ? ?Objective: ?Vitals:  ? 01/31/22 1500 01/31/22 1600 01/31/22 1612 01/31/22 1700  ?BP: (!) 139/58 126/62  125/71  ?Pulse: 79 70 69 68  ?Resp: 19 19 16 16   ?Temp:   98.7 ?F (37.1 ?C)   ?TempSrc:   Axillary   ?SpO2: 97% 97% 96% 97%  ?Weight:      ?Height:      ? ? ?Intake/Output Summary (Last 24 hours) at 01/31/2022 1851 ?Last data filed at 01/31/2022 1821 ?Gross per 24 hour  ?Intake 570 ml  ?Output 1875 ml  ?Net -1305 ml  ? ?Weight change: -0.7 kg ?Exam: ? ?General exam: Sleeping, easily wakes up, on BiPAP ?Respiratory system: Diminished breath sounds/crackles at bases.  Respiratory effort normal. ?Cardiovascular system:RRR. No murmurs, rubs, gallops. ?Gastrointestinal system: Abdomen is nondistended, soft and nontender. No organomegaly or masses felt. Normal bowel sounds heard. ?Central nervous system: Alert and oriented. No focal neurological deficits. ?Extremities: No C/C/E, +pedal pulses ?Skin: No rashes, lesions or ulcers ?Psychiatry: Judgement and insight appear normal. Mood & affect appropriate.  ? ? ? ?Data Reviewed: ?I have personally reviewed following labs and imaging studies ?Basic Metabolic Panel: ?Recent Labs  ?Lab 01/26/22 ?9518 01/27/22 ?8416 01/27/22 ?6063 01/28/22 ?0160 01/29/22 ?1093 01/30/22 ?0419 01/31/22 ?0422  ?NA  --    < > 135 136 134* 137 141  ?K  --    < > 3.9 4.4 4.3 4.9 4.4  ?CL  --    < > 99 96* 96* 96* 96*  ?CO2  --    < > 28 30 30 31  34*  ?GLUCOSE  --    < > 256* 273* 289* 230* 200*  ?BUN  --    < > 44* 62* 70* 75* 77*  ?CREATININE  --    < > 2.22* 2.40* 2.73* 2.26* 2.13*  ?CALCIUM  --    < > 8.3* 8.6* 8.3* 8.6* 9.1  ?MG 2.2   < > 2.0 2.2 2.4 2.4 2.4  ?PHOS 4.5  --   --   --   --   --   --   ? < > = values in this interval not displayed.  ? ?Liver Function Tests: ?Recent Labs  ?Lab 01/26/22 ?  9842 01/27/22 ?1031  ?AST 24 19  ?ALT 25 20  ?ALKPHOS 77 58  ?BILITOT 0.8 0.6  ?PROT 6.8 5.7*  ?ALBUMIN 3.5 3.0*  ? ?No results for input(s): LIPASE, AMYLASE in the last 168 hours. ?No results for input(s): AMMONIA in the last 168 hours. ?Coagulation Profile: ?No results for input(s): INR, PROTIME in the last 168 hours. ?CBC: ?Recent Labs  ?Lab 01/26/22 ?0432 01/27/22 ?0313 01/30/22 ?0419 01/31/22 ?0422  ?WBC 9.0 6.5 8.7 13.8*  ?NEUTROABS 6.6  --   --   --   ?HGB 10.8* 8.7* 9.8* 12.2*  ?HCT 35.5* 28.3* 31.1* 40.4  ?MCV 95.9 93.1 92.8 93.1  ?PLT 212 165 260 353  ? ?Cardiac Enzymes: ?No results for input(s): CKTOTAL, CKMB, CKMBINDEX, TROPONINI in the last 168 hours. ?BNP: ?Invalid input(s): POCBNP ?CBG: ?Recent Labs  ?Lab 01/30/22 ?2107 01/31/22 ?0432 01/31/22 ?0731  01/31/22 ?1101 01/31/22 ?1613  ?GLUCAP 205* 176* 164* 107* 228*  ? ?HbA1C: ?No results for input(s): HGBA1C in the last 72 hours. ?Urine analysis: ?   ?Component Value Date/Time  ? [place] YELLOW 01/26/2022

## 2022-01-31 NOTE — Progress Notes (Signed)
? ?NAME:  John [name], MRN:  [id number], DOB:  04-20-1941, LOS: 5 ?ADMISSION DATE:  01/26/2022, CONSULTATION DATE:  01/31/2022 ? ?REFERRING MD:  Tat, TRH , CHIEF COMPLAINT: Respiratory failure on BiPAP ? ?History of Present Illness:  ?81 year old ex-smoker admitted 4/7 for shortness of breath, respiratory distress and found to be hypoxic to 65% on room air, initially required nonrebreather and then BiPAP.  Initial labs significant for no leukocytosis, hemoglobin 10.8, creatinine 2.2 .  Chest x-ray showed CHF superimposed on hyperinflation.  BNP was 710 ?PCCM consulted on 4/10 due to persistent BiPAP requirement ? ?Recent admission 3/87/5643 for acute systolic heart failure and NSTEMI, cardiac cath deferred due to AKI, peak creatinine 3.8.  His shortness of breath persisted after admission and gradually worsened ?I/O shows +2.5 L ? ?Pertinent  Medical History  ?-Ischemic cardiomyopathy , DES x2 to LAD , Non-STEMI 12/2021, cath not performed due to AKI ?-HFrEF, EF 35% in 12/2021 ?-Paroxysmal atrial fibrillation ?-Type 2 diabetes ?-PAD status post right femoropopliteal bypass at [facility] 05/2018 ?-CKD stage IIIb ?-CVA ?-Smoked 40 pack years before he quit at age 52 ? ?Significant Hospital Events: ?Including procedures, antibiotic start and stop dates in addition to other pertinent events   ?Echo 4/7 EF 30 to 35%, global hypokinesis, grade 2 diastolic dysfunction ?CT chest/abdomen/pelvis 12/24/2021 severe multilobar bronchopneumonia With small bilateral effusions ?HRCT/10/23 decreased bilateral consolidations , evidence of pulmonary edema with bilateral right more than left effusions, cannot evaluate for ILD ?4/10 Lasix increased to 80 every 12 ? ?Interim History / Subjective:  ?Diuresed 3.1 L last 24 hours ?Continues to be short of breath though and requested to be put back on BiPAP 11:30 AM ? ?Objective   ?Blood pressure 137/67, pulse (!) 102, temperature 99.8 ?F (37.7 ?C), temperature source Oral, resp. rate (!) 22,  height 5\' 7"  (1.702 m), weight 87.3 kg, SpO2 95 %. ?   ?FiO2 (%):  [45 %] 45 %  ? ?Intake/Output Summary (Last 24 hours) at 01/31/2022 1341 ?Last data filed at 01/31/2022 1015 ?Gross per 24 hour  ?Intake 440 ml  ?Output 2950 ml  ?Net -2510 ml  ? ? ?Filed Weights  ? 01/29/22 0500 01/30/22 0427 01/31/22 0432  ?Weight: 88.8 kg 88 kg 87.3 kg  ? ? ?Examination: ?General: Elderly man sitting up in bed, on BiPAP fullface mask, no distress ?HENT: Mild pallor, no icterus, no JVD, very hard of hearing ?Lungs: Decreased breath sounds on right, ?Cardiovascular: S1-S2 regular, ejection systolic murmur 2/ 6 at apex ?Abdomen: Soft, nontender abdomen, no fluid thrill ?Extremities: No edema, no deformity ?Neuro: Alert, interactive ?GU: Clear urine ? ?Chest x-ray 4/12 independently reviewed which shows no lobar infiltrate/effusions ?Labs show decreasing creatinine, stable BUN, mild leukocytosis ? ?Resolved Hospital Problem list   ? ? ?Assessment & Plan:  ?Acute hypoxic respiratory failure ?Acute on chronic systolic heart failure ??  Underlying COPD ?Recent bronchopneumonia 12/2021 , appears improved , no evidence of ILD on HRCT ?-He has persistent dyspnea even though saturations remain stable on 4 L nasal cannula ?-He appears dry but has bibasal crackles, elevated BNP  ? ?Recommend ?-Continue diuresis with Lasix 80 every 12 as tolerated by renal function ?-Continue budesonide and arformoterol nebs , no evidence of bronchospasm so dc Solu-Medrol  ?-Goal-directed therapy for heart failure per cardiology ?-Use BiPAP as needed for work of breathing ?-Proceed with RT  thoracentesis tomorrow since right effusion persists in spite of diuresis, hold apixaban in anticipation ? ?Daughter [name] updated at bedside ? ?Best Practice (  right click and "Reselect all SmartList Selections" daily)  ? ?Diet/type: Regular consistency (see orders) ?DVT prophylaxis: DOAC ?GI prophylaxis: N/A ?Lines: N/A ?Foley:  N/A ?Code Status:  DNR ?Last date of  multidisciplinary goals of care discussion [ongoing with patient and daughter] ? ?Labs   ?CBC: ?Recent Labs  ?Lab 01/26/22 ?0432 01/27/22 ?0313 01/30/22 ?0419 01/31/22 ?0422  ?WBC 9.0 6.5 8.7 13.8*  ?NEUTROABS 6.6  --   --   --   ?HGB 10.8* 8.7* 9.8* 12.2*  ?HCT 35.5* 28.3* 31.1* 40.4  ?MCV 95.9 93.1 92.8 93.1  ?PLT 212 165 260 353  ? ? ? ?Basic Metabolic Panel: ?Recent Labs  ?Lab 01/26/22 ?3295 01/27/22 ?1884 01/27/22 ?1660 01/28/22 ?6301 01/29/22 ?6010 01/30/22 ?0419 01/31/22 ?0422  ?NA  --    < > 135 136 134* 137 141  ?K  --    < > 3.9 4.4 4.3 4.9 4.4  ?CL  --    < > 99 96* 96* 96* 96*  ?CO2  --    < > 28 30 30 31  34*  ?GLUCOSE  --    < > 256* 273* 289* 230* 200*  ?BUN  --    < > 44* 62* 70* 75* 77*  ?CREATININE  --    < > 2.22* 2.40* 2.73* 2.26* 2.13*  ?CALCIUM  --    < > 8.3* 8.6* 8.3* 8.6* 9.1  ?MG 2.2   < > 2.0 2.2 2.4 2.4 2.4  ?PHOS 4.5  --   --   --   --   --   --   ? < > = values in this interval not displayed.  ? ? ?GFR: ?Estimated Creatinine Clearance: 27.7 mL/min (A) (by C-G formula based on SCr of 2.13 mg/dL (H)). ?Recent Labs  ?Lab 01/26/22 ?0432 01/26/22 ?9323 01/27/22 ?0313 01/28/22 ?5573 01/30/22 ?0419 01/31/22 ?0422  ?PROCALCITON  --  <0.10  --  <0.10  --   --   ?WBC 9.0  --  6.5  --  8.7 13.8*  ? ? ? ?Liver Function Tests: ?Recent Labs  ?Lab 01/26/22 ?0432 01/27/22 ?0313  ?AST 24 19  ?ALT 25 20  ?ALKPHOS 77 58  ?BILITOT 0.8 0.6  ?PROT 6.8 5.7*  ?ALBUMIN 3.5 3.0*  ? ? ?No results for input(s): LIPASE, AMYLASE in the last 168 hours. ?No results for input(s): AMMONIA in the last 168 hours. ? ?ABG ?   ?Component Value Date/Time  ? PHART 7.47 (H) 01/31/2022 0420  ? PCO2ART 51 (H) 01/31/2022 0420  ? PO2ART 77 (L) 01/31/2022 0420  ? HCO3 37.1 (H) 01/31/2022 0420  ? TCO2 23 06/25/2013 0936  ? ACIDBASEDEF 0.7 12/24/2021 0746  ? O2SAT 96.1 01/31/2022 0420  ? ?  ? ?Coagulation Profile: ?No results for input(s): INR, PROTIME in the last 168 hours. ? ?Cardiac Enzymes: ?No results for input(s): CKTOTAL, CKMB,  CKMBINDEX, TROPONINI in the last 168 hours. ? ?HbA1C: ?Hgb A1c MFr Bld  ?Date/Time Value Ref Range Status  ?12/24/2021 07:46 AM 7.1 (H) 4.8 - 5.6 % Final  ?  Comment:  ?  (NOTE) ?Pre diabetes:          5.7%-6.4% ? ?Diabetes:              >6.4% ? ?Glycemic control for   <7.0% ?adults with diabetes ?  ?09/13/2021 07:56 AM 8.7 (H) 4.8 - 5.6 % Final  ?  Comment:  ?  (NOTE) ?Pre diabetes:  5.7%-6.4% ? ?Diabetes:              >6.4% ? ?Glycemic control for   <7.0% ?adults with diabetes ?  ? ? ?CBG: ?Recent Labs  ?Lab 01/30/22 ?1611 01/30/22 ?2107 01/31/22 ?0432 01/31/22 ?0731 01/31/22 ?1101  ?GLUCAP 106* 205* 176* 164* 107*  ? ? ? ? ?Kara Mead MD. Shade Flood. ?[facility] ?Pager : [phone] ? ?If no response to pager , please call [phone] until 7 pm ?After 7:00 pm call Elink  [phone]  ? ?01/31/2022 ? ?

## 2022-01-31 NOTE — Care Management Important Message (Signed)
Important Message ? ?Patient Details  ?Name: John [name] ?MRN: [id number] ?Date of Birth: October 10, 1941 ? ? ?Medicare Important Message Given:  Yes ? ?Received return call from daughter.  Reviewed  Medicare IM. Copy sent securely to email address provided: [email or web address]. ? ? ?Dannette Barbara ?01/31/2022, 11:37 AM ?

## 2022-01-31 NOTE — Care Management Important Message (Signed)
Important Message ? ?Patient Details  ?Name: John [name] ?MRN: [id number] ?Date of Birth: 06/23/1941 ? ? ?Medicare Important Message Given:  Other (see comment) ? ?Attempted to review Medicare IM with patient via room phone, no answer upon attempt.  Message left with Talbert Nan, daughter, at [phone] in an attempt to review.  Encouraged callback.   ? ? ?Dannette Barbara ?01/31/2022, 10:19 AM ?

## 2022-01-31 NOTE — Consult Note (Signed)
? ?Progress Note ? ?Patient Name: John [name] ?Date of Encounter: 01/31/2022 ? ?Primary Cardiologist: Kirk Ruths, MD  ? ?Subjective  ? ?Diuresed overnight and able to come off BIPAP ?Patient notes that he feels like his breathing is worse off of BIPAP.  Still have significant O2 requirement ? ?Inpatient Medications  ?  ?Scheduled Meds: ? apixaban  2.5 mg Oral BID  ? arformoterol  15 mcg Nebulization BID  ? budesonide (PULMICORT) nebulizer solution  0.5 mg Nebulization BID  ? chlorhexidine  15 mL Mouth Rinse BID  ? Chlorhexidine Gluconate Cloth  6 each Topical Q0600  ? clopidogrel  75 mg Oral Daily  ? furosemide  80 mg Intravenous Q12H  ? gabapentin  300 mg Oral QHS  ? insulin aspart  0-5 Units Subcutaneous QHS  ? insulin aspart  0-9 Units Subcutaneous TID WC  ? insulin aspart  7 Units Subcutaneous TID WC  ? insulin glargine-yfgn  28 Units Subcutaneous QHS  ? ipratropium-albuterol  3 mL Nebulization TID  ? isosorbide dinitrate  20 mg Oral BID  ? mouth rinse  15 mL Mouth Rinse q12n4p  ? methylPREDNISolone (SOLU-MEDROL) injection  40 mg Intravenous Daily  ? metoprolol succinate  50 mg Oral Daily  ? pantoprazole  40 mg Oral Q breakfast  ? rosuvastatin  10 mg Oral QPM  ? tamsulosin  0.4 mg Oral Daily  ? ?Continuous Infusions: ? sodium chloride Stopped (01/26/22 2157)  ? azithromycin Stopped (01/30/22 1144)  ? ?PRN Meds: ?sodium chloride, acetaminophen, alum & mag hydroxide-simeth, hydrALAZINE, ipratropium-albuterol  ? ?Vital Signs  ?  ?Vitals:  ? 01/31/22 0715 01/31/22 0752 01/31/22 0800 01/31/22 0814  ?BP:   (!) 148/76   ?Pulse: 95  (!) 44 85  ?Resp: (!) 27  (!) 24 16  ?Temp: 99 ?F (37.2 ?C)     ?TempSrc: Axillary     ?SpO2: 98% 97% 93% 97%  ?Weight:      ?Height:      ? ? ?Intake/Output Summary (Last 24 hours) at 01/31/2022 0831 ?Last data filed at 01/31/2022 0800 ?Gross per 24 hour  ?Intake 690 ml  ?Output 3150 ml  ?Net -2460 ml  ? ?Filed Weights  ? 01/29/22 0500 01/30/22 0427 01/31/22 0432  ?Weight: 88.8 kg 88 kg  87.3 kg  ? ? ?Telemetry  ?  ?SR Frequent PVCs no true [facility] - Personally Reviewed ? ? ?Physical Exam  ? ?Gen: mild distress, elderly male  ?Neck: 7 cm JVD at 45 degrees,  ?Ears: Bilateral Pilar Plate Sign ?Cardiac: No Rubs or Gallops, holosystolic Murmur, RRR +2 radial  ?Respiratory: Decreased air movement with tachpnea ?GI: Soft, nontender, non-distended  ?MS: No  edema;  moves all extremities ?Integument: Skin feels warm ?Neuro:  At time of evaluation, alert and oriented to person/place/time/situation  ?Psych: Normal affect, patient feels OK ? ? ?Labs  ?  ?Chemistry ?Recent Labs  ?Lab 01/26/22 ?0432 01/27/22 ?0277 01/27/22 ?4128 01/28/22 ?7867 01/29/22 ?6720 01/30/22 ?0419 01/31/22 ?0422  ?NA 136   < > 135   < > 134* 137 141  ?K 4.1   < > 3.9   < > 4.3 4.9 4.4  ?CL 98   < > 99   < > 96* 96* 96*  ?CO2 31   < > 28   < > 30 31 34*  ?GLUCOSE 228*   < > 256*   < > 289* 230* 200*  ?BUN 33*   < > 44*   < > 70* 75* 77*  ?  CREATININE 2.17*   < > 2.22*   < > 2.73* 2.26* 2.13*  ?CALCIUM 8.5*   < > 8.3*   < > 8.3* 8.6* 9.1  ?PROT 6.8  --  5.7*  --   --   --   --   ?ALBUMIN 3.5  --  3.0*  --   --   --   --   ?AST 24  --  19  --   --   --   --   ?ALT 25  --  20  --   --   --   --   ?ALKPHOS 77  --  58  --   --   --   --   ?BILITOT 0.8  --  0.6  --   --   --   --   ?GFRNONAA 29*   < > 29*   < > 22* 28* 30*  ?ANIONGAP 7   < > 8   < > 8 10 11   ? < > = values in this interval not displayed.  ?  ? ?Hematology ?Recent Labs  ?Lab 01/27/22 ?0313 01/30/22 ?0419 01/31/22 ?0422  ?WBC 6.5 8.7 13.8*  ?RBC 3.04* 3.35* 4.34  ?HGB 8.7* 9.8* 12.2*  ?HCT 28.3* 31.1* 40.4  ?MCV 93.1 92.8 93.1  ?MCH 28.6 29.3 28.1  ?MCHC 30.7 31.5 30.2  ?RDW 15.6* 15.4 15.4  ?PLT 165 260 353  ? ? ?Cardiac EnzymesNo results for input(s): TROPONINI in the last 168 hours. No results for input(s): TROPIPOC in the last 168 hours.  ? ?BNP ?Recent Labs  ?Lab 01/26/22 ?0432 01/29/22 ?0418  ?BNP 710.0* 968.0*  ?  ? ?DDimer No results for input(s): DDIMER in the last 168 hours.   ? ?Radiology  ?  ?CT Chest High Resolution ? ?Result Date: 01/29/2022 ?CLINICAL DATA:  Interstitial lung disease, recent pneumonia EXAM: CT CHEST WITHOUT CONTRAST TECHNIQUE: Multidetector CT imaging of the chest was performed following the standard protocol without intravenous contrast. High resolution imaging of the lungs, as well as inspiratory and expiratory imaging, was performed. RADIATION DOSE REDUCTION: This exam was performed according to the departmental dose-optimization program which includes automated exposure control, adjustment of the mA and/or kV according to patient size and/or use of iterative reconstruction technique. COMPARISON:  CT chest, abdomen and pelvis dated December 24, 2021 FINDINGS: Cardiovascular: Normal heart size. No pericardial effusion. Left main and three-vessel coronary artery calcifications. Atherosclerotic disease of the thoracic aorta. Mediastinum/Nodes: Esophagus and thyroid are unremarkable. No pathologically enlarged lymph nodes seen in the chest. Lungs/Pleura: Central airways are patent. No significant air trapping. Bilateral bronchial wall thickening and interlobular septal thickening. Moderate right and small left pleural effusions with associated atelectasis. Upper Abdomen: No acute abnormality. Musculoskeletal: No chest wall mass or suspicious bone lesions identified. IMPRESSION: 1. Interval decreased ground-glass opacities and airspace consolidations, possibly due resolving infectious/inflammatory process or decreased pulmonary edema. 2. Persistent bilateral bronchial wall thickening and interlobular septal thickening, likely due to pulmonary edema. 3. Moderate right and small left pleural effusions with associated atelectasis, increased when compared with prior exam. 4. Evaluation for interstitial lung disease is limited due to presence of pulmonary edema and pleural effusions. If there is continued clinical concern, repeat ILD protocol CT could be performed after  resolution of acute findings. 5. Aortic Atherosclerosis (ICD10-I70.0). Electronically Signed   By: Yetta Glassman M.D.   On: 01/29/2022 16:47  ? ?DG Chest Port 1 View ? ?Result Date: 01/31/2022 ?CLINICAL DATA:  81 year old male  with acute on chronic respiratory failure. NSTEMI last month. On BiPAP. EXAM: PORTABLE CHEST 1 VIEW COMPARISON:  Noncontrast chest CT 01/29/2022 and earlier. FINDINGS: Portable AP upright view at 0425 hours. Stable lung volumes and mediastinal contours. Ventilation appears stable from the CT 2 days ago demonstrating right greater than left pleural effusions with combination of compressive atelectasis and interstitial/ground-glass opacity. No pneumothorax. Radiographically, ventilation has not significantly changed from 01/26/2022. Calcified aortic atherosclerosis. Visualized tracheal air column is within normal limits. No acute osseous abnormality identified. IMPRESSION: Ventilation not significantly changed since 01/26/2022 with bilateral pleural effusions, atelectasis and probable pulmonary interstitial edema (less likely viral/atypical respiratory infection). Electronically Signed   By: Genevie Ann M.D.   On: 01/31/2022 05:00   ? ?Patient Profile  ?   ?81 y.o. male with mixed picture hypoxic respiratory failure ? ?Assessment & Plan  ?  ?Acute on Chronic hypoxic respiratory failure ?- acute on chronic COPD ?- Acute on Chronic HFrEF ?AKI on CKID ?CAD and NSTEMI (deferred cath see 4/11 note) ?PAD s/p 2019 Fem/Pop Bypass ?PAF CHASDVASC 4+ ?NSVT ?- ANRI/ACE/MRA/SGLT2i not initiated due to AKI ?- lasix 80 IV BID (still hypervolemic) creatinine continues to improve ?-added diamox X1 today ?- Succinate 50 mg PO daily ?- continue eliquis 2.5 for PAF, on plavix as well, given mixed picture NSTEMI (cath deferred) this is reasonable ?- continue statin, LD goal < 55 ?- isodril 20  ?- talked with patient and daughter; daughter wonders if they should not pursue LHC; we discussed that if he is having CP, of  when his kidney stablizes and recovers we can re-evaluate this but since he continues to improve would not plan for [facility] and transfer at this time ? ? ?For questions or updates, please contact [place] ?P

## 2022-01-31 NOTE — Progress Notes (Signed)
Salter HFNC decreased from 8lpm to 7lpm due to spo2 99% ?

## 2022-02-01 ENCOUNTER — Inpatient Hospital Stay (HOSPITAL_COMMUNITY): Payer: Medicare Other

## 2022-02-01 ENCOUNTER — Other Ambulatory Visit (HOSPITAL_COMMUNITY): Payer: Self-pay

## 2022-02-01 DIAGNOSIS — J9622 Acute and chronic respiratory failure with hypercapnia: Secondary | ICD-10-CM | POA: Diagnosis not present

## 2022-02-01 DIAGNOSIS — N184 Chronic kidney disease, stage 4 (severe): Secondary | ICD-10-CM | POA: Diagnosis not present

## 2022-02-01 DIAGNOSIS — I5043 Acute on chronic combined systolic (congestive) and diastolic (congestive) heart failure: Secondary | ICD-10-CM | POA: Diagnosis not present

## 2022-02-01 DIAGNOSIS — J9621 Acute and chronic respiratory failure with hypoxia: Secondary | ICD-10-CM | POA: Diagnosis not present

## 2022-02-01 DIAGNOSIS — J90 Pleural effusion, not elsewhere classified: Secondary | ICD-10-CM | POA: Diagnosis not present

## 2022-02-01 LAB — MAGNESIUM: Magnesium: 2.4 mg/dL (ref 1.7–2.4)

## 2022-02-01 LAB — GRAM STAIN

## 2022-02-01 LAB — CBC
HCT: 29.9 % — ABNORMAL LOW (ref 39.0–52.0)
Hemoglobin: 9.3 g/dL — ABNORMAL LOW (ref 13.0–17.0)
MCH: 29 pg (ref 26.0–34.0)
MCHC: 31.1 g/dL (ref 30.0–36.0)
MCV: 93.1 fL (ref 80.0–100.0)
Platelets: 248 10*3/uL (ref 150–400)
RBC: 3.21 MIL/uL — ABNORMAL LOW (ref 4.22–5.81)
RDW: 15.4 % (ref 11.5–15.5)
WBC: 8.3 10*3/uL (ref 4.0–10.5)
nRBC: 0 % (ref 0.0–0.2)

## 2022-02-01 LAB — BASIC METABOLIC PANEL
Anion gap: 9 (ref 5–15)
BUN: 84 mg/dL — ABNORMAL HIGH (ref 8–23)
CO2: 33 mmol/L — ABNORMAL HIGH (ref 22–32)
Calcium: 8.2 mg/dL — ABNORMAL LOW (ref 8.9–10.3)
Chloride: 98 mmol/L (ref 98–111)
Creatinine, Ser: 2.61 mg/dL — ABNORMAL HIGH (ref 0.61–1.24)
GFR, Estimated: 24 mL/min — ABNORMAL LOW (ref >60)
Glucose, Bld: 190 mg/dL — ABNORMAL HIGH (ref 70–99)
Potassium: 3.8 mmol/L (ref 3.5–5.1)
Sodium: 140 mmol/L (ref 135–145)

## 2022-02-01 LAB — PROTEIN, PLEURAL OR PERITONEAL FLUID: Total protein, fluid: <3 g/dL

## 2022-02-01 LAB — GLUCOSE, CAPILLARY
Glucose-Capillary: 150 mg/dL — ABNORMAL HIGH (ref 70–99)
Glucose-Capillary: 187 mg/dL — ABNORMAL HIGH (ref 70–99)
Glucose-Capillary: 269 mg/dL — ABNORMAL HIGH (ref 70–99)
Glucose-Capillary: 273 mg/dL — ABNORMAL HIGH (ref 70–99)

## 2022-02-01 LAB — LACTATE DEHYDROGENASE, PLEURAL OR PERITONEAL FLUID: LD, Fluid: 46 U/L — ABNORMAL HIGH (ref 3–23)

## 2022-02-01 LAB — GLUCOSE, PLEURAL OR PERITONEAL FLUID: Glucose, Fluid: 213 mg/dL

## 2022-02-01 LAB — ALBUMIN, PLEURAL OR PERITONEAL FLUID: Albumin, Fluid: <1.5 g/dL

## 2022-02-01 MED ORDER — APIXABAN 2.5 MG PO TABS
2.5000 mg | ORAL_TABLET | Freq: Two times a day (BID) | ORAL | Status: DC
  Administered 2022-02-01 – 2022-02-06 (×10): 2.5 mg via ORAL
  Filled 2022-02-01 (×10): qty 1

## 2022-02-01 NOTE — Progress Notes (Signed)
? ?Progress Note ? ?Patient Name: John [name] ?Date of Encounter: 02/01/2022 ? ?[facility] Cardiologist: Kirk Ruths, MD  ? ?Subjective  ? ?Feels better. States breathing is improving.  ? ?Apixaban held given plans for right sided thoracentesis ?BP 120/50s ?HR 50-60s with very frequent ventricular ectopy ?On HFNC ?Cr 2.13>2.61; lasix held this AM ?Net negative 2.2L ?Wt 192lbs>188lbs ? ?Inpatient Medications  ?  ?Scheduled Meds: ? arformoterol  15 mcg Nebulization BID  ? budesonide (PULMICORT) nebulizer solution  0.5 mg Nebulization BID  ? chlorhexidine  15 mL Mouth Rinse BID  ? Chlorhexidine Gluconate Cloth  6 each Topical Q0600  ? clopidogrel  75 mg Oral Daily  ? gabapentin  300 mg Oral QHS  ? insulin aspart  0-5 Units Subcutaneous QHS  ? insulin aspart  0-9 Units Subcutaneous TID WC  ? insulin aspart  7 Units Subcutaneous TID WC  ? insulin glargine-yfgn  28 Units Subcutaneous QHS  ? ipratropium-albuterol  3 mL Nebulization TID  ? isosorbide dinitrate  20 mg Oral BID  ? mouth rinse  15 mL Mouth Rinse q12n4p  ? metoprolol succinate  50 mg Oral Daily  ? pantoprazole  40 mg Oral Q breakfast  ? rosuvastatin  10 mg Oral QPM  ? tamsulosin  0.4 mg Oral Daily  ? ?Continuous Infusions: ? sodium chloride Stopped (01/26/22 2157)  ? ?PRN Meds: ?sodium chloride, acetaminophen, alum & mag hydroxide-simeth, hydrALAZINE, ipratropium-albuterol  ? ?Vital Signs  ?  ?Vitals:  ? 02/01/22 0803 02/01/22 0805 02/01/22 1102 02/01/22 1114  ?BP:      ?Pulse:   67   ?Resp:      ?Temp:    97.9 ?F (36.6 ?C)  ?TempSrc:    Oral  ?SpO2: 99% 99%    ?Weight:      ?Height:      ? ? ?Intake/Output Summary (Last 24 hours) at 02/01/2022 1125 ?Last data filed at 02/01/2022 1042 ?Gross per 24 hour  ?Intake 500 ml  ?Output 2800 ml  ?Net -2300 ml  ? ? ?  02/01/2022  ?  5:00 AM 01/31/2022  ?  4:32 AM 01/30/2022  ?  4:27 AM  ?Last 3 Weights  ?Weight (lbs) 188 lb 0.8 oz 192 lb 7.4 oz 194 lb 0.1 oz  ?Weight (kg) 85.3 kg 87.3 kg 88 kg  ?   ? ?Telemetry  ?   ?SR, very frequent PVCs, ventricular bigeminy, NSVT - Personally Reviewed ? ?ECG  ?  ?No new tracing - Personally Reviewed ? ?Physical Exam  ? ?GEN: Elderly male, comfortable, NAD ?Neck: No JVD ?Cardiac: Irregular, no murmurs ?Respiratory: Diminished throughout. ?GI: Soft, nontender, non-distended  ?MS: No edema; No deformity. ?Neuro:  Nonfocal  ?Psych: Normal affect  ? ?Labs  ?  ?High Sensitivity Troponin:   ?Recent Labs  ?Lab 01/08/22 ?2352 01/26/22 ?0432 01/26/22 ?6283 01/27/22 ?1517 01/27/22 ?6160  ?TROPONINIHS 118* 40* 39* 36* 39*  ?   ?Chemistry ?Recent Labs  ?Lab 01/26/22 ?0432 01/26/22 ?7371 01/27/22 ?0626 01/28/22 ?9485 01/30/22 ?0419 01/31/22 ?0422 02/01/22 ?0420  ?NA 136   < > 135   < > 137 141 140  ?K 4.1   < > 3.9   < > 4.9 4.4 3.8  ?CL 98   < > 99   < > 96* 96* 98  ?CO2 31   < > 28   < > 31 34* 33*  ?GLUCOSE 228*   < > 256*   < > 230* 200* 190*  ?BUN 33*   < >  44*   < > 75* 77* 84*  ?CREATININE 2.17*   < > 2.22*   < > 2.26* 2.13* 2.61*  ?CALCIUM 8.5*   < > 8.3*   < > 8.6* 9.1 8.2*  ?MG  --    < > 2.0   < > 2.4 2.4 2.4  ?PROT 6.8  --  5.7*  --   --   --   --   ?ALBUMIN 3.5  --  3.0*  --   --   --   --   ?AST 24  --  19  --   --   --   --   ?ALT 25  --  20  --   --   --   --   ?ALKPHOS 77  --  58  --   --   --   --   ?BILITOT 0.8  --  0.6  --   --   --   --   ?GFRNONAA 29*   < > 29*   < > 28* 30* 24*  ?ANIONGAP 7   < > 8   < > 10 11 9   ? < > = values in this interval not displayed.  ?  ?Lipids No results for input(s): CHOL, TRIG, HDL, LABVLDL, LDLCALC, CHOLHDL in the last 168 hours.  ?Hematology ?Recent Labs  ?Lab 01/30/22 ?0419 01/31/22 ?0422 02/01/22 ?0420  ?WBC 8.7 13.8* 8.3  ?RBC 3.35* 4.34 3.21*  ?HGB 9.8* 12.2* 9.3*  ?HCT 31.1* 40.4 29.9*  ?MCV 92.8 93.1 93.1  ?MCH 29.3 28.1 29.0  ?MCHC 31.5 30.2 31.1  ?RDW 15.4 15.4 15.4  ?PLT 260 353 248  ? ?Thyroid No results for input(s): TSH, FREET4 in the last 168 hours.  ?BNP ?Recent Labs  ?Lab 01/26/22 ?0432 01/29/22 ?0418  ?BNP 710.0* 968.0*  ?  ?DDimer  No results for input(s): DDIMER in the last 168 hours.  ? ?Radiology  ?  ?DG Chest 1 View ? ?Result Date: 02/01/2022 ?CLINICAL DATA:  Status post right thoracentesis. EXAM: CHEST  1 VIEW COMPARISON:  Chest radiograph dated January 31, 2022 FINDINGS: The heart is enlarged. Atherosclerotic calcification of the aortic arch. Low lung volumes with bibasilar opacities representing atelectasis/effusions. No appreciable pneumothorax. IMPRESSION: No appreciable pneumothorax. Electronically Signed   By: Keane Police D.O.   On: 02/01/2022 10:09  ? ?DG Chest Port 1 View ? ?Result Date: 01/31/2022 ?CLINICAL DATA:  81 year old male with acute on chronic respiratory failure. NSTEMI last month. On BiPAP. EXAM: PORTABLE CHEST 1 VIEW COMPARISON:  Noncontrast chest CT 01/29/2022 and earlier. FINDINGS: Portable AP upright view at 0425 hours. Stable lung volumes and mediastinal contours. Ventilation appears stable from the CT 2 days ago demonstrating right greater than left pleural effusions with combination of compressive atelectasis and interstitial/ground-glass opacity. No pneumothorax. Radiographically, ventilation has not significantly changed from 01/26/2022. Calcified aortic atherosclerosis. Visualized tracheal air column is within normal limits. No acute osseous abnormality identified. IMPRESSION: Ventilation not significantly changed since 01/26/2022 with bilateral pleural effusions, atelectasis and probable pulmonary interstitial edema (less likely viral/atypical respiratory infection). Electronically Signed   By: Genevie Ann M.D.   On: 01/31/2022 05:00  ? ?US THORACENTESIS ASP PLEURAL SPACE W/IMG GUIDE ? ?Result Date: 02/01/2022 ?INDICATION: Right pleural effusion Hypoxemia EXAM: ULTRASOUND GUIDED Right THORACENTESIS MEDICATIONS: 10 cc 1% lidocaine. COMPLICATIONS: None immediate. PROCEDURE: An ultrasound guided thoracentesis was thoroughly discussed with the patient and questions answered. The benefits, risks, alternatives and  complications were also discussed. The patient  understands and wishes to proceed with the procedure. Written consent was obtained. Ultrasound was performed to localize and mark an adequate pocket of fluid in the Right chest. The area was then prepped and draped in the normal sterile fashion. 1% Lidocaine was used for local anesthesia. Under ultrasound guidance a Yueh catheter was introduced. Thoracentesis was performed. The catheter was removed and a dressing applied. FINDINGS: A total of approximately 1.4 liters of clear yellow fluid was removed. Samples were sent to the laboratory as requested by the clinical team. IMPRESSION: Successful ultrasound guided right thoracentesis yielding 1.4 liters of pleural fluid. Post CXR: NO PTX per Dr Thornton Papas Read by Lavonia Drafts [facility] Electronically Signed   By: Lavonia Dana M.D.   On: 02/01/2022 10:18   ? ?Cardiac Studies  ? ?Limited echo 01/26/22 ? 1. Left ventricular ejection fraction, by estimation, is 30 to 35%. The  ?left ventricle has moderately decreased function. The left ventricle  ?demonstrates global hypokinesis. The left ventricular internal cavity size  ?was mildly to moderately dilated.  ?There is mild left ventricular hypertrophy.  ? 2. Right ventricular systolic function is normal. The right ventricular  ?size is normal.  ? 3. The inferior vena cava is dilated in size with >50% respiratory  ?variability, suggesting right atrial pressure of 8 mmHg.  ? ?Comparison(s): The left ventricular function is worsened.  ?  ?Full echo 12/25/21 ? ? 1. Left ventricular ejection fraction, by estimation, is 35%. The left  ?ventricle has moderately decreased function. The left ventricle  ?demonstrates global hypokinesis. The left ventricular internal cavity size  ?was mildly dilated. There is mild left  ?ventricular hypertrophy. Left ventricular diastolic parameters are  ?consistent with Grade II diastolic dysfunction (pseudonormalization).  ? 2. Right ventricular systolic function is  normal. The right ventricular  ?size is normal. Tricuspid regurgitation signal is inadequate for assessing  ?PA pressure.  ? 3. The mitral valve is normal in structure. Trivial mitral valve  ?regurgitation. No

## 2022-02-01 NOTE — Progress Notes (Signed)
? ?NAME:  John [name], MRN:  [id number], DOB:  [birth year], LOS: 6 ?ADMISSION DATE:  01/26/2022, CONSULTATION DATE:  02/01/2022 ? ?REFERRING MD:  Tat, TRH , CHIEF COMPLAINT: Respiratory failure on BiPAP ? ?History of Present Illness:  ?81 year old ex-smoker admitted 4/7 for shortness of breath, respiratory distress and found to be hypoxic to 65% on room air, initially required nonrebreather and then BiPAP.  Initial labs significant for no leukocytosis, hemoglobin 10.8, creatinine 2.2 .  Chest x-ray showed CHF superimposed on hyperinflation.  BNP was 710 ?PCCM consulted on 4/10 due to persistent BiPAP requirement ? ?Recent admission 2/70/6237 for acute systolic heart failure and NSTEMI, cardiac cath deferred due to AKI, peak creatinine 3.8.  His shortness of breath persisted after admission and gradually worsened ?I/O shows +2.5 L ? 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 ?Significant Hospital Events: ?Including procedures, antibiotic start and stop dates in addition to other pertinent events   ?Echo 4/7 EF 30 to 35%, global hypokinesis, grade 2 diastolic dysfunction ?CT chest/abdomen/pelvis 12/24/2021 severe multilobar bronchopneumonia With small bilateral effusions ?HRCT/10/23 decreased bilateral consolidations , evidence of pulmonary edema with bilateral right more than left effusions, cannot evaluate for ILD ?4/10 Lasix increased to 80 every 12 ?4/12 Diuresed 3.1 L  ?4/13 thoracenteses >> 1.4 L ? ?Interim History / Subjective:  ? ?Breathing gradually improving ?Off BiPAP since morning ?Diuresed 2.8 L , net -4 L ?He underwent thoracentesis ? ?Objective   ?Blood pressure (!) 128/51, [name] 67, temperature 97.9 ?F (36.6 ?C),  temperature source Oral, resp. rate 18, height 5\' 7"  (1.702 m), weight 85.3 kg, SpO2 96 %. ?   ?   ? ?Intake/Output Summary (Last 24 hours) at 02/01/2022 1453 ?Last data filed at 02/01/2022 1042 ?Gross per 24 hour  ?Intake 130 ml  ?Output 2800 ml  ?Net -2670 ml  ? ? ?Filed Weights  ? 01/30/22 0427 01/31/22 0432 02/01/22 0500  ?Weight: 88 kg 87.3 kg 85.3 kg  ? ? ?Examination: ?General: Elderly man sitting up in chair, no distress ?HENT: Mild pallor, no icterus, no JVD, very hard of hearing ?Lungs: Improved breath sounds on right, no rhonchi, no accessory muscle use ?Cardiovascular: S1-S2 regular, ejection systolic murmur 2/ 6 at apex ?Abdomen: Soft, nontender abdomen, no fluid thrill ?Extremities: No edema, no deformity ?Neuro: Alert, interactive ?GU: Clear urine ? ?Chest x-ray 4/13 independently reviewed which shows improved aeration on right postthoracentesis ?Labs show rising BUN and creatinine, no leukocytosis ? ?Pleural fluid shows LDH 46 and protein less than 3 ? ?Resolved Hospital Problem list   ? ? ?Assessment & Plan:  ?Acute hypoxic respiratory failure ?Acute on chronic systolic heart failure ??  Underlying COPD ?Recent bronchopneumonia 12/2021 , appears improved , no evidence of ILD on HRCT ?-He has persistent dyspnea even though saturations remain stable on 4 L nasal cannula ?-He appears dry but has bibasal crackles, elevated BNP  ? ?Recommend ?-Lasix on hold due to rising creatinine ?-Continue budesonide and arformoterol nebs , off Solu-Medrol  ?-Goal-directed therapy for heart failure per cardiology ?-Use BiPAP as needed for work of breathing ? ? ?Pleural effusions -status postthoracentesis, as expected transudative ?-Can resume apixaban ? ?Daughter John [name] updated at bedside , does not qualify for  home NIV/bipap ? ?Best Practice (right click and "Reselect all SmartList Selections" daily)  ? ?Diet/type: Regular consistency (see orders) ?DVT prophylaxis: DOAC ?GI prophylaxis: N/A ?Lines: N/A ?Foley:  N/A ?Code  Status:  DNR ?Last date of multidisciplinary goals of care discussion [ongoing with patient and daughter] ? ?Labs   ?CBC: ?Recent Labs  ?Lab 01/26/22 ?2440 01/27/22 ?0313 01/30/22 ?0419 01/31/22 ?0422 02/01/22 ?0420  ?WBC 9.0 6.5 8.7 13.8* 8.3  ?NEUTROABS 6.6  --   --   --   --   ?HGB 10.8* 8.7* 9.8* 12.2* 9.3*  ?HCT 35.5* 28.3* 31.1* 40.4 29.9*  ?MCV 95.9 93.1 92.8 93.1 93.1  ?PLT 212 165 260 353 248  ? ? ? ?Basic Metabolic Panel: ?Recent Labs  ?Lab 01/26/22 ?1027 01/27/22 ?2536 01/28/22 ?6440 01/29/22 ?3474 01/30/22 ?0419 01/31/22 ?0422 02/01/22 ?2595  ?NA  --    < > 136 134* 137 141 140  ?K  --    < > 4.4 4.3 4.9 4.4 3.8  ?CL  --    < > 96* 96* 96* 96* 98  ?CO2  --    < > 30 30 31  34* 33*  ?GLUCOSE  --    < > 273* 289* 230* 200* 190*  ?BUN  --    < > 62* 70* 75* 77* 84*  ?CREATININE  --    < > 2.40* 2.73* 2.26* 2.13* 2.61*  ?CALCIUM  --    < > 8.6* 8.3* 8.6* 9.1 8.2*  ?MG 2.2   < > 2.2 2.4 2.4 2.4 2.4  ?PHOS 4.5  --   --   --   --   --   --   ? < > = values in this interval not displayed.  ? ? ?GFR: ?Estimated Creatinine Clearance: 22.4 mL/min (A) (by C-G formula based on SCr of 2.61 mg/dL (H)). ?Recent Labs  ?Lab 01/26/22 ?6387 01/27/22 ?0313 01/28/22 ?5643 01/30/22 ?0419 01/31/22 ?0422 02/01/22 ?0420  ?PROCALCITON <0.10  --  <0.10  --   --   --   ?WBC  --  6.5  --  8.7 13.8* 8.3  ? ? ? ?Liver Function Tests: ?Recent Labs  ?Lab 01/26/22 ?0432 01/27/22 ?0313  ?AST 24 19  ?ALT 25 20  ?ALKPHOS 77 58  ?BILITOT 0.8 0.6  ?PROT 6.8 5.7*  ?ALBUMIN 3.5 3.0*  ? ? ?No results for input(s): LIPASE, AMYLASE in the last 168 hours. ?No results for input(s): AMMONIA in the last 168 hours. ? ?ABG ?   ?Component Value Date/Time  ? PHART 7.47 (H) 01/31/2022 0420  ? PCO2ART 51 (H) 01/31/2022 0420  ? PO2ART 77 (L) 01/31/2022 0420  ? HCO3 37.1 (H) 01/31/2022 0420  ? TCO2 23 06/25/2013 0936  ? ACIDBASEDEF 0.7 12/24/2021 0746  ? O2SAT 96.1 01/31/2022 0420  ? ?  ? ?Coagulation Profile: ?No results for input(s): INR, PROTIME in the last  168 hours. ? ?Cardiac Enzymes: ?No results for input(s): CKTOTAL, CKMB, CKMBINDEX, TROPONINI in the last 168 hours. ? ?HbA1C: ?Hgb A1c MFr Bld  ?Date/Time Value Ref Range Status  ?12/24/2021 07:46 AM 7.1 (H) 4.8 - 5.6 % Final  ?  Comment:  ?  (NOTE) ?Pre diabetes:          5.7%-6.4% ? ?Diabetes:              >6.4% ? ?Glycemic control for   <7.0% ?adults with diabetes ?  ?09/13/2021 07:56 AM 8.7 (H) 4.8 - 5.6 % Final  ?  Comment:  ?  (  NOTE) ?Pre diabetes:          5.7%-6.4% ? ?Diabetes:              >6.4% ? ?Glycemic control for   <7.0% ?adults with diabetes ?  ? ? ?CBG: ?Recent Labs  ?Lab 01/31/22 ?1101 01/31/22 ?1613 01/31/22 ?2134 02/01/22 ?6431 02/01/22 ?1123  ?GLUCAP 107* 228* 293* 150* 187*  ? ? ? ? ?Kara Mead MD. Shade Flood. ?[facility] ?Pager : [phone] ? ?If no response to pager , please call [phone] until 7 pm ?After 7:00 pm call Elink  [phone]  ? ?02/01/2022 ? ?

## 2022-02-01 NOTE — Progress Notes (Signed)
?  ?       ?PROGRESS NOTE ? ?Eisa Necaise DPO:[id number] DOB: 06/01/1941 DOA: 01/26/2022 ?PCP: [name], DO ? ?Brief History:  ?81 year old male with a history of recent NSTEMI 12/2021, diabetes mellitus type 2, hyperlipidemia, hypertension, paroxysmal atrial fibrillation, GERD, BPH, and CKD stage III presenting with shortness of breath and respiratory distress.  The patient presented with oxygen saturation 65% on room air.  He was initially placed on nonrebreather.  He was subsequently placed on BiPAP in the emergency department. ?He was recently admitted to the hospital from 01/08/2022 to 01/10/2022 for acute on chronic systolic and diastolic CHF.  He was discharged home with torsemide 20 mg daily.  He has been compliant with his medications and fluid restriction.  In fact, he has had decreased oral intake.  He has only gained 1 pound since discharge from the hospital.  His last weight was 194 pounds on the day of admission.  In addition, the patient also had a hospital admission from 12/24/2021 to 12/29/2021 for which he was treated medically for NSTEMI.  His troponin peaked >24K.  he was discharged home on Isordil, hydralazine, carvedilol, Crestor, and Plavix. ?Since discharge home from the hospital on 01/11/2022, the patient had remained short of breath with exertion.  His daughter states that he has essentially gradually declined since discharge.  After participating with occupational therapy on 01/25/2022, the patient became more short of breath as the day progressed.  He has not had any fevers, chills, headache, neck pain, chest pain, coughing, hemoptysis, nausea, vomiting or diarrhea. ?In the ED, the patient was afebrile hemodynamically stable with oxygen saturation 98-100% on BiPAP.  Troponins were flat 39>>40.  Initial labs showed WBC 9.0, Hgb 10.8, platelets 212, Na+ 136, K+ 4.1 and creatinine 2.17. AST 24 and ALT 25. Initial and repeat Hs Troponin flat at 40 and 39. BNP not yet ordered but will add-on to AM  labs. 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 EKG showed sinus rhythm with PVCs.  The patient was started on IV furosemide, bronchodilators, IV steroids, and azithromycin.  Cardiology and PCCM were consulted to assist  ? ? ? ?Assessment and Plan: ?* Acute on chronic respiratory failure with hypoxia and hypercapnia (HCC) ?Secondary to COPD exacerbation and CHF exacerbation ?At baseline, patient is on 3 L nasal cannula ?Wean oxygen for saturation greater 90% ?Intermittently requiring BiPAP throughout the day and at hs ?Little functional reserve ?--noncompliant with CPAP at home ?Appreciate PCCM consult>>use BiPAP for increase WOB prn and wean ?4/11 High Res CT chest>>Persistent bilateral bronchial wall thickening and interlobularseptal thickening, likely due to pulmonary edema. ? ?COPD exacerbation ([place]) ?Continue Pulmicort ?Continue brovana ?Continue DuoNebs ?IV steroids discontinued since he was not wheezing ?Intermittently requiring BiPAP during day and at hs ?Appreciate PCCM ?Does not qualify for home NIV/BiPAP ? ?Acute on chronic combined systolic and diastolic CHF (congestive heart failure) ([place]) ?3623 echo EF 35%, global HK, grade 2 DD ?He was being treated with intravenous Lasix 80 mg twice daily ?Diuretics subsequently held on 4/13 due to rising creatinine ?Daily weights--not accurate ?Accurate I's and O's-=-incomplete ?4/11 High Res CT chest>>Persistent bilateral bronchial wall thickening and interlobularseptal thickening, likely due to pulmonary edema. ?Appreciate cardiology consult ? ?Pleural effusion, right ?-S/p thoracentesis on 4/13 with removal of 1.4 L of fluid ?-Fluid analysis indicates transudative process ? ?CKD (chronic kidney disease), stage IV ([place]) ?Baseline creatinine 2.0-2.3 ?Creatinine up to 2.6 today, likely related to diuretics ?Monitor with diuresis ? ?Paroxysmal atrial fibrillation (HCC) ?Anticoagulated with  apixaban.   ?Carvedilol changed to Toprol-XL ?Optimize potassium ?Currently in  sinus ? ?Coronary atherosclerosis of native coronary artery ?No chest pain presently ?Personally reviewed EKG--sinus, LBBB, nonspecific STT ?Troponins are flat 39>>40>>36>>39 ?Continue Plavix, Crestor, Toprol ? ?Urine retention ?01/30/22--had serial In-N-Out catheterizations, but eventually had Foley catheter placed ? ?BPH (benign prostatic hyperplasia) ?Continue tamulosin ? ?Elevated troponin ?Trends are flat ?No chest pain ?Personally reviewed EKG--sinus, LBBB, nonspecific STT ?Secondary to demand ischemia ?4/7 limited echo--EF 30-35%, trivial pericardial effusion, global HK ?Appreciate cardiology consult ? ?Type 2 diabetes mellitus with hyperglycemia (HCC) ?12/24/2021 hemoglobin A1c 7.1 ?NovoLog sliding scale ?increase dose Semglee to 28 units ?Increase novolog 7 units with meals ? ? ?Essential hypertension ?Continue  Isordil ?Coreg changed to metoprolol ? ?Peripheral vascular disease ([place]) ?- He is s/p right fem-pop bypass at [facility] in 05/2018. Remains on Plavix and statin therapy.  ? ?Mixed hyperlipidemia ?Continue statin ? ?Status is: Inpatient ?Remains inpatient appropriate because: severity of illness requiring BiPAP and IV lasix ?  ?  ?  ?Family Communication:   daughter updated at bedside 4/12 ?  ?Consultants:  cardiology, pulmonology ?  ?Code Status:  DNR ?  ?DVT Prophylaxis: apixaban ?  ?  ?Procedures: ?As Listed in Progress Note Above ?  ?Antibiotics: ?Azithro 4/7>>4/9 ?  ? ? ?Subjective: ?Patient reports that he required some BiPAP overnight.  Currently on nasal cannula, sitting up in chair. ? ?Objective: ?Vitals:  ? 02/01/22 1800 02/01/22 1957 02/01/22 2002 02/01/22 2007  ?BP:      ?Pulse: 67     ?Resp: 18     ?Temp:      ?TempSrc:      ?SpO2: 92% 96% 100% 97%  ?Weight:      ?Height:      ? ? ?Intake/Output Summary (Last 24 hours) at 02/01/2022 2015 ?Last data filed at 02/01/2022 1758 ?Gross per 24 hour  ?Intake 0 ml  ?Output 3300 ml  ?Net -3300 ml  ? ?Weight change: -2 kg ?Exam: ? ?General exam:  Sitting up on bedside commode, no distress ?Respiratory system: Crackles at bases.  Respiratory effort normal. ?Cardiovascular system:RRR. No murmurs, rubs, gallops. ?Gastrointestinal system: Abdomen is nondistended, soft and nontender. No organomegaly or masses felt. Normal bowel sounds heard. ?Central nervous system: Alert and oriented. No focal neurological deficits. ?Extremities: No C/C/E, +pedal pulses ?Skin: No rashes, lesions or ulcers ?Psychiatry: Judgement and insight appear normal. Mood & affect appropriate.  ? ? ? ?Data Reviewed: ?I have personally reviewed following labs and imaging studies ?Basic Metabolic Panel: ?Recent Labs  ?Lab 01/26/22 ?4650 01/27/22 ?3546 01/28/22 ?5681 01/29/22 ?2751 01/30/22 ?0419 01/31/22 ?0422 02/01/22 ?7001  ?NA  --    < > 136 134* 137 141 140  ?K  --    < > 4.4 4.3 4.9 4.4 3.8  ?CL  --    < > 96* 96* 96* 96* 98  ?CO2  --    < > 30 30 31  34* 33*  ?GLUCOSE  --    < > 273* 289* 230* 200* 190*  ?BUN  --    < > 62* 70* 75* 77* 84*  ?CREATININE  --    < > 2.40* 2.73* 2.26* 2.13* 2.61*  ?CALCIUM  --    < > 8.6* 8.3* 8.6* 9.1 8.2*  ?MG 2.2   < > 2.2 2.4 2.4 2.4 2.4  ?PHOS 4.5  --   --   --   --   --   --   ? < > =  values in this interval not displayed.  ? ?Liver Function Tests: ?Recent Labs  ?Lab 01/26/22 ?0432 01/27/22 ?0313  ?AST 24 19  ?ALT 25 20  ?ALKPHOS 77 58  ?BILITOT 0.8 0.6  ?PROT 6.8 5.7*  ?ALBUMIN 3.5 3.0*  ? ?No results for input(s): LIPASE, AMYLASE in the last 168 hours. ?No results for input(s): AMMONIA in the last 168 hours. ?Coagulation Profile: ?No results for input(s): INR, PROTIME in the last 168 hours. ?CBC: ?Recent Labs  ?Lab 01/26/22 ?5597 01/27/22 ?0313 01/30/22 ?0419 01/31/22 ?0422 02/01/22 ?0420  ?WBC 9.0 6.5 8.7 13.8* 8.3  ?NEUTROABS 6.6  --   --   --   --   ?HGB 10.8* 8.7* 9.8* 12.2* 9.3*  ?HCT 35.5* 28.3* 31.1* 40.4 29.9*  ?MCV 95.9 93.1 92.8 93.1 93.1  ?PLT 212 165 260 353 248  ? ?Cardiac Enzymes: ?No results for input(s): CKTOTAL, CKMB, CKMBINDEX,  TROPONINI in the last 168 hours. ?BNP: ?Invalid input(s): POCBNP ?CBG: ?Recent Labs  ?Lab 01/31/22 ?1613 01/31/22 ?2134 02/01/22 ?4163 02/01/22 ?1123 02/01/22 ?1837  ?GLUCAP 228* 293* 150* 187* 269*  ? ?HbA1C: ?No r

## 2022-02-01 NOTE — Procedures (Signed)
? ?  US Guided Rt Thoracentesis ? ?1.4 Liters clear yellow fluid obtained ?Sent for labs per MD ? ?Tolerated well ?Post CXR:  NO PTX per Dr Thornton Papas ? ?EBL: None ?

## 2022-02-02 DIAGNOSIS — I5043 Acute on chronic combined systolic (congestive) and diastolic (congestive) heart failure: Secondary | ICD-10-CM | POA: Diagnosis not present

## 2022-02-02 DIAGNOSIS — I480 Paroxysmal atrial fibrillation: Secondary | ICD-10-CM | POA: Diagnosis not present

## 2022-02-02 DIAGNOSIS — I2510 Atherosclerotic heart disease of native coronary artery without angina pectoris: Secondary | ICD-10-CM | POA: Diagnosis not present

## 2022-02-02 DIAGNOSIS — J9621 Acute and chronic respiratory failure with hypoxia: Secondary | ICD-10-CM | POA: Diagnosis not present

## 2022-02-02 DIAGNOSIS — J90 Pleural effusion, not elsewhere classified: Secondary | ICD-10-CM | POA: Diagnosis not present

## 2022-02-02 DIAGNOSIS — N184 Chronic kidney disease, stage 4 (severe): Secondary | ICD-10-CM | POA: Diagnosis not present

## 2022-02-02 DIAGNOSIS — I10 Essential (primary) hypertension: Secondary | ICD-10-CM | POA: Diagnosis not present

## 2022-02-02 LAB — BASIC METABOLIC PANEL
Anion gap: 10 (ref 5–15)
BUN: 80 mg/dL — ABNORMAL HIGH (ref 8–23)
CO2: 28 mmol/L (ref 22–32)
Calcium: 7.9 mg/dL — ABNORMAL LOW (ref 8.9–10.3)
Chloride: 98 mmol/L (ref 98–111)
Creatinine, Ser: 2.39 mg/dL — ABNORMAL HIGH (ref 0.61–1.24)
GFR, Estimated: 26 mL/min — ABNORMAL LOW (ref >60)
Glucose, Bld: 147 mg/dL — ABNORMAL HIGH (ref 70–99)
Potassium: 3.3 mmol/L — ABNORMAL LOW (ref 3.5–5.1)
Sodium: 136 mmol/L (ref 135–145)

## 2022-02-02 LAB — MAGNESIUM: Magnesium: 2.4 mg/dL (ref 1.7–2.4)

## 2022-02-02 LAB — CBC
HCT: 32.1 % — ABNORMAL LOW (ref 39.0–52.0)
Hemoglobin: 9.6 g/dL — ABNORMAL LOW (ref 13.0–17.0)
MCH: 27.8 pg (ref 26.0–34.0)
MCHC: 29.9 g/dL — ABNORMAL LOW (ref 30.0–36.0)
MCV: 93 fL (ref 80.0–100.0)
Platelets: 233 10*3/uL (ref 150–400)
RBC: 3.45 MIL/uL — ABNORMAL LOW (ref 4.22–5.81)
RDW: 15.3 % (ref 11.5–15.5)
WBC: 9 10*3/uL (ref 4.0–10.5)
nRBC: 0 % (ref 0.0–0.2)

## 2022-02-02 LAB — CYTOLOGY - NON PAP

## 2022-02-02 LAB — GLUCOSE, CAPILLARY
Glucose-Capillary: 96 mg/dL (ref 70–99)
Glucose-Capillary: 203 mg/dL — ABNORMAL HIGH (ref 70–99)
Glucose-Capillary: 101 mg/dL — ABNORMAL HIGH (ref 70–99)
Glucose-Capillary: 91 mg/dL (ref 70–99)

## 2022-02-02 MED ORDER — TORSEMIDE 20 MG PO TABS
40.0000 mg | ORAL_TABLET | Freq: Every day | ORAL | Status: DC
  Administered 2022-02-03 – 2022-02-06 (×4): 40 mg via ORAL
  Filled 2022-02-02 (×4): qty 2

## 2022-02-02 MED ORDER — BETHANECHOL CHLORIDE 10 MG PO TABS
10.0000 mg | ORAL_TABLET | Freq: Three times a day (TID) | ORAL | Status: DC
  Administered 2022-02-02 – 2022-02-04 (×6): 10 mg via ORAL
  Filled 2022-02-02 (×12): qty 1

## 2022-02-02 MED ORDER — TRAZODONE HCL 50 MG PO TABS
50.0000 mg | ORAL_TABLET | Freq: Every day | ORAL | Status: DC
  Administered 2022-02-02 – 2022-02-05 (×4): 50 mg via ORAL
  Filled 2022-02-02 (×4): qty 1

## 2022-02-02 MED ORDER — ENSURE ENLIVE PO LIQD
237.0000 mL | Freq: Two times a day (BID) | ORAL | Status: DC
  Administered 2022-02-03 – 2022-02-06 (×6): 237 mL via ORAL

## 2022-02-02 MED ORDER — HYDROXYZINE HCL 25 MG PO TABS: 25.0000 mg | ORAL_TABLET | Freq: Three times a day (TID) | ORAL | Status: DC | PRN

## 2022-02-02 MED ORDER — POTASSIUM CHLORIDE CRYS ER 20 MEQ PO TBCR
40.0000 meq | EXTENDED_RELEASE_TABLET | Freq: Once | ORAL | Status: AC
  Administered 2022-02-02: 40 meq via ORAL
  Filled 2022-02-02: qty 2

## 2022-02-02 MED ORDER — ALPRAZOLAM 0.25 MG PO TABS
0.2500 mg | ORAL_TABLET | Freq: Three times a day (TID) | ORAL | Status: DC | PRN
  Administered 2022-02-02 – 2022-02-04 (×4): 0.25 mg via ORAL
  Filled 2022-02-02 (×4): qty 1

## 2022-02-02 MED ORDER — ALPRAZOLAM 0.25 MG PO TABS
0.2500 mg | ORAL_TABLET | Freq: Two times a day (BID) | ORAL | Status: DC | PRN
  Administered 2022-02-02: 0.25 mg via ORAL
  Filled 2022-02-02: qty 1

## 2022-02-02 NOTE — Evaluation (Signed)
Physical Therapy Evaluation ?Patient Details ?Name: John [name] ?MRN: [id number] ?DOB: June 09, 1941 ?Today's Date: 02/02/2022 ? ?History of Present Illness ? Dymond Spreen is a 81 y.o. male with medical history significant for T2DM, CAD s/p stent placement, hypertension, combined diastolic and systolic CHF, Hyperlipidemia, GERD, BPH, depression and anxiety who presents to the emergency department via EMS due to worsening shortness of breath which increased overnight.  Patient had BiPAP on and was unable to provide history, most of the history was obtained from daughter at bedside, some of the report was obtained from the ED physician and ED medical record.  Per report, patient lives with the daughter, he complained of increasing shortness of breath yesterday, this worsened overnight and he called the daughter who activated EMS.  On arrival of EMS team, it was noted to have an O2 sat of 65% on room air, he was placed on NRB at 15 L with improved O2 sat to 100%.  Patient followed up with his cardiologist on 4/4 (Dr. [name]) and his Demadex was continued.  He was recently admitted from 3/20 to 3/22 due to acute on chronic combined systolic and diastolic CHF ?  ?Clinical Impression ? Patient has most difficulty sitting up at bedside with poor carryover for propping up on elbows to hands due to weakness, demonstrates slightly labored movement for completing transfers and ambulating in hallway without loss of balance and limited mostly due to fatigue.  Patient on 3 LPM O2 during gait training with SpO2 maintaining at 95% and tolerated sitting up in chair after therapy - RN aware.  Patient will benefit from continued skilled physical therapy in hospital and recommended venue below to increase strength, balance, endurance for safe ADLs and gait.    ?   ? ?Recommendations for follow up therapy are one component of a multi-disciplinary discharge planning process, led by the attending physician.  Recommendations may be updated  based on patient status, additional functional criteria and insurance authorization. ? ?Follow Up Recommendations Skilled nursing-short term rehab (<3 hours/day) ? ?  ?Assistance Recommended at Discharge Set up Supervision/Assistance  ?Patient can return home with the following ? A little help with walking and/or transfers;A little help with bathing/dressing/bathroom;Assistance with cooking/housework;Help with stairs or ramp for entrance ? ?  ?Equipment Recommendations None recommended by PT  ?Recommendations for Other Services ?    ?  ?Functional Status Assessment Patient has had a recent decline in their functional status and demonstrates the ability to make significant improvements in function in a reasonable and predictable amount of time.  ? ?  ?Precautions / Restrictions Precautions ?Precautions: Fall ?Restrictions ?Weight Bearing Restrictions: No  ? ?  ? ?Mobility ? Bed Mobility ?Overal bed mobility: Needs Assistance ?Bed Mobility: Supine to Sit ?  ?  ?Supine to sit: Mod assist ?  ?  ?General bed mobility comments: slow labored movement with difficulty propping up on elbows to hands due to weakness ?  ? ?Transfers ?Overall transfer level: Needs assistance ?Equipment used: Rolling walker (2 wheels) ?Transfers: Sit to/from Stand, Bed to chair/wheelchair/BSC ?Sit to Stand: Min guard, Min assist ?  ?Step pivot transfers: Min guard, Min assist ?  ?  ?  ?General transfer comment: increased time, labored movement ?  ? ?Ambulation/Gait ?Ambulation/Gait assistance: Min assist, Min guard ?Gait Distance (Feet): 50 Feet ?Assistive device: Rolling walker (2 wheels) ?Gait Pattern/deviations: Decreased step length - right, Decreased step length - left, Decreased stride length ?Gait velocity: decreased ?  ?  ?General Gait Details: slightly labored slow  cadence without loss of balance, on 3 LPM with SpO2 at 93%, limited mostly due to fatigue ? ?Stairs ?  ?  ?  ?  ?  ? ?Wheelchair Mobility ?  ? ?Modified Rankin (Stroke  Patients Only) ?  ? ?  ? ?Balance Overall balance assessment: Needs assistance ?Sitting-balance support: Feet supported, No upper extremity supported ?Sitting balance-Leahy Scale: Fair ?Sitting balance - Comments: fair/good seated at EOB ?  ?Standing balance support: During functional activity, No upper extremity supported, Bilateral upper extremity supported ?Standing balance-Leahy Scale: Fair ?Standing balance comment: fair/good using RW ?  ?  ?  ?  ?  ?  ?  ?  ?  ?  ?  ?   ? ? ? ?Pertinent Vitals/Pain Pain Assessment ?Pain Assessment: 0-10 ?Pain Score: 5  ?Pain Location: right hip and leg ?Pain Descriptors / Indicators: Sore ?Pain Intervention(s): Limited activity within patient's tolerance, Monitored during session, Repositioned  ? ? ?Home Living Family/patient expects to be discharged to:: Private residence ?Living Arrangements: Children ?Available Help at Discharge: Available PRN/intermittently ?Type of Home: House ?Home Access: Stairs to enter ?Entrance Stairs-Rails: Left ?Entrance Stairs-Number of Steps: 3 ?  ?Home Layout: One level ?Home Equipment: Cane - single point;Rollator (4 wheels);Shower seat;Wheelchair - manual ?   ?  ?Prior Function Prior Level of Function : Needs assist ?  ?  ?  ?Physical Assist : ADLs (physical);Mobility (physical) ?Mobility (physical): Bed mobility;Transfers;Gait;Stairs ?ADLs (physical): IADLs ?Mobility Comments: Household and short distanced ambulator using RW or SPC ?ADLs Comments: Modified independent for ADL's ; family assisting with IADL's. ?  ? ? ?Hand Dominance  ? Dominant Hand: Right ? ?  ?Extremity/Trunk Assessment  ? Upper Extremity Assessment ?Upper Extremity Assessment: Generalized weakness ?  ? ?Lower Extremity Assessment ?Lower Extremity Assessment: Generalized weakness ?  ? ?Cervical / Trunk Assessment ?Cervical / Trunk Assessment: Normal  ?Communication  ? Communication: No difficulties  ?Cognition Arousal/Alertness: Awake/alert ?Behavior During Therapy: [facility] for  tasks assessed/performed ?Overall Cognitive Status: Within Functional Limits for tasks assessed ?  ?  ?  ?  ?  ?  ?  ?  ?  ?  ?  ?  ?  ?  ?  ?  ?  ?  ?  ? ?  ?General Comments   ? ?  ?Exercises    ? ?Assessment/Plan  ?  ?PT Assessment Patient needs continued PT services  ?PT Problem List Decreased strength;Decreased activity tolerance;Decreased balance;Decreased mobility ? ?   ?  ?PT Treatment Interventions DME instruction;Gait training;Stair training;Functional mobility training;Therapeutic activities;Therapeutic exercise;Patient/family education;Balance training   ? ?PT Goals (Current goals can be found in the Care Plan section)  ?Acute Rehab PT Goals ?Patient Stated Goal: return home after rehab ? ?  ?Frequency Min 3X/week ?  ? ? ?Co-evaluation   ?  ?  ?  ?  ? ? ?  ?AM-PAC PT "6 Clicks" Mobility  ?Outcome Measure Help needed turning from your back to your side while in a flat bed without using bedrails?: A Little ?Help needed moving from lying on your back to sitting on the side of a flat bed without using bedrails?: A Lot ?Help needed moving to and from a bed to a chair (including a wheelchair)?: A Little ?Help needed standing up from a chair using your arms (e.g., wheelchair or bedside chair)?: A Little ?Help needed to walk in hospital room?: A Little ?Help needed climbing 3-5 steps with a railing? : A Lot ?6 Click Score: 16 ? ?  ?  End of Session Equipment Utilized During Treatment: Oxygen ?Activity Tolerance: Patient tolerated treatment well;Patient limited by fatigue ?Patient left: in chair;with call bell/phone within reach ?Nurse Communication: Mobility status ?PT Visit Diagnosis: Other abnormalities of gait and mobility (R26.89);Muscle weakness (generalized) (M62.81);Unsteadiness on feet (R26.81) ?  ? ?Time: 9810-2548 ?PT Time Calculation (min) (ACUTE ONLY): 23 min ? ? ?Charges:   PT Evaluation ?$PT Eval Moderate Complexity: 1 Mod ?PT Treatments ?$Therapeutic Activity: 23-37 mins ?  ?   ? ? ?2:03 PM,  02/02/22 ?Lonell Grandchild, MPT ?Physical Therapist with [facility] ?[facility] ?[phone] office ?1040 mobile phone ? ? ?

## 2022-02-02 NOTE — [place] FL2 (Signed)
?[place] MEDICAID FL2 LEVEL OF CARE SCREENING TOOL  ?  ? ?IDENTIFICATION  ?Patient Name: ?John [name] Birthdate: 1941-03-23 Sex: male Admission Date (Current Location): ?01/26/2022  ?[place] and [place] Number: ? [place] and Address:  ?[facility] [address] [place] ?     Provider Number: ?[id number]  ?Attending Physician Name and Address:  ?Kathie Dike, MD ? Relative Name and Phone Number:  ?  ?   ?Current Level of Care: ?Hospital Recommended Level of Care: ?[place] Prior Approval Number: ?  ? ?Date Approved/Denied: ?  PASRR Number: ?  ? ?Discharge Plan: ?SNF ?  ? ?Current Diagnoses: ?Patient Active Problem List  ? Diagnosis Date Noted  ? Pleural effusion, right   ? Urine retention 01/30/2022  ? COPD exacerbation ([place]) 01/26/2022  ? CKD (chronic kidney disease), stage IV ([place]) 01/26/2022  ? Paroxysmal atrial fibrillation ([place]) 01/26/2022  ? Diabetes mellitus, type 2 ([place]) 01/09/2022  ? Acute on chronic respiratory failure with hypoxia and hypercapnia ([place]) 01/08/2022  ? Hypoxia 12/24/2021  ? D-dimer, elevated 12/24/2021  ? NSTEMI (non-ST elevated myocardial infarction) ([place]) 12/24/2021  ? Acute on chronic combined systolic and diastolic CHF (congestive heart failure) ([place]) 09/13/2021  ? Acute respiratory failure with hypoxia and hypercapnia ([place]) 09/13/2021  ? Type 2 diabetes mellitus with hyperglycemia ([place]) 09/13/2021  ? Elevated troponin 09/13/2021  ? Obstructive sleep apnea 09/13/2021  ? BPH (benign prostatic hyperplasia) 09/13/2021  ? CKD (chronic kidney disease), stage III ([place]) 09/13/2021  ? Diabetic neuropathy ([place]) 09/13/2021  ? Chest pain 12/20/2020  ? Acute diastolic CHF (congestive heart failure) ([place]) 12/19/2020  ? Essential hypertension 10/03/2012  ? CAD (coronary artery disease) of artery bypass graft 06/26/2012  ? Cardiomyopathy, 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  ? Essential hypertension, malignant 06/26/2012  ? Mixed hyperlipidemia 06/26/2012  ?  Peripheral vascular disease ([place]) 06/26/2012  ? Coronary atherosclerosis of native coronary artery 06/26/2012  ? ? ?Orientation RESPIRATION BLADDER Height & Weight   ?  ?Self, Time, Situation, Place ? O2 (3L) Incontinent Weight: 185 lb 3 oz (84 kg) ?Height:  5\' 7"  (170.2 cm)  ?BEHAVIORAL SYMPTOMS/MOOD NEUROLOGICAL BOWEL NUTRITION STATUS  ?    Continent Diet (Heart Healthy)  ?AMBULATORY STATUS COMMUNICATION OF NEEDS Skin   ?Extensive Assist Verbally Normal ?  ?  ?  ?    ?     ?     ? ? ?Personal Care Assistance Level of Assistance  ?Bathing, Feeding, Dressing Bathing Assistance: Limited assistance ?Feeding assistance: Independent ?Dressing Assistance: Limited assistance ?   ? ?Functional Limitations Info  ?Sight, Hearing, Speech Sight Info: Adequate ?Hearing Info: Adequate ?Speech Info: Adequate  ? ? ?SPECIAL CARE FACTORS FREQUENCY  ?PT (By licensed PT), OT (By licensed OT)   ?  ?PT Frequency: 5 times weekly ?OT Frequency: 5 times weekly ?  ?  ?  ?   ? ? ?Contractures Contractures Info: Not present  ? ? ?Additional Factors Info  ?Code Status, Allergies Code Status Info: DNR ?Allergies Info: Brilinta (ticagrelor), penicillins, ezetimibe-simvastatin ?  ?  ?  ?   ? ?Current Medications (02/02/2022):  This is the current hospital active medication list ?Current Facility-Administered Medications  ?Medication Dose Route Frequency Provider Last Rate Last Admin  ? 0.9 %  sodium chloride infusion   Intravenous PRN Tat, Shanon Brow, MD   Stopped at 01/26/22 2157  ? acetaminophen (TYLENOL) tablet 650 mg  650 mg Oral Q6H PRN Adefeso, Oladapo, DO   650 mg  at 02/02/22 4008  ? ALPRAZolam Duanne Moron) tablet 0.25 mg  0.25 mg Oral BID PRN Kathie Dike, MD   0.25 mg at 02/02/22 1042  ? alum & mag hydroxide-simeth (MAALOX/MYLANTA) 200-200-20 MG/5ML suspension 30 mL  30 mL Oral Q6H PRN Tat, David, MD   30 mL at 01/27/22 1117  ? apixaban (ELIQUIS) tablet 2.5 mg  2.5 mg Oral BID Rigoberto Noel, MD   2.5 mg at 02/02/22 0857  ? arformoterol  (BROVANA) nebulizer solution 15 mcg  15 mcg Nebulization BID Orson Eva, MD   15 mcg at 02/02/22 6761  ? bethanechol (URECHOLINE) tablet 10 mg  10 mg Oral TID Kathie Dike, MD   10 mg at 02/02/22 1151  ? budesonide (PULMICORT) nebulizer solution 0.5 mg  0.5 mg Nebulization BID Tat, Shanon Brow, MD   0.5 mg at 02/02/22 0757  ? chlorhexidine (PERIDEX) 0.12 % solution 15 mL  15 mL Mouth Rinse BID Orson Eva, MD   15 mL at 02/02/22 0857  ? Chlorhexidine Gluconate Cloth 2 % PADS 6 each  6 each Topical Q0600 Adefeso, Oladapo, DO   6 each at 02/02/22 0916  ? clopidogrel (PLAVIX) tablet 75 mg  75 mg Oral Daily Adefeso, Oladapo, DO   75 mg at 02/02/22 0857  ? gabapentin (NEURONTIN) capsule 300 mg  300 mg Oral QHS Adefeso, Oladapo, DO   300 mg at 02/01/22 2030  ? hydrALAZINE (APRESOLINE) injection 10 mg  10 mg Intravenous Q6H PRN Adefeso, Oladapo, DO   10 mg at 01/31/22 0340  ? insulin aspart (novoLOG) injection 0-5 Units  0-5 Units Subcutaneous QHS Adefeso, Oladapo, DO   3 Units at 02/01/22 2158  ? insulin aspart (novoLOG) injection 0-9 Units  0-9 Units Subcutaneous TID WC Adefeso, Oladapo, DO   3 Units at 02/02/22 1132  ? insulin aspart (novoLOG) injection 7 Units  7 Units Subcutaneous TID WC Orson Eva, MD   7 Units at 02/02/22 1132  ? insulin glargine-yfgn (SEMGLEE) injection 28 Units  28 Units Subcutaneous Benay Pike, MD   28 Units at 02/01/22 2159  ? ipratropium-albuterol (DUONEB) 0.5-2.5 (3) MG/3ML nebulizer solution 3 mL  3 mL Nebulization Q4H PRN Adefeso, Oladapo, DO   3 mL at 01/31/22 0402  ? ipratropium-albuterol (DUONEB) 0.5-2.5 (3) MG/3ML nebulizer solution 3 mL  3 mL Nebulization TID Orson Eva, MD   3 mL at 02/02/22 0754  ? isosorbide dinitrate (ISORDIL) tablet 20 mg  20 mg Oral BID Adefeso, Oladapo, DO   20 mg at 02/02/22 0857  ? MEDLINE mouth rinse  15 mL Mouth Rinse q12n4p Tat, David, MD   15 mL at 02/02/22 1116  ? metoprolol succinate (TOPROL-XL) 24 hr tablet 50 mg  50 mg Oral Daily [name], MD    50 mg at 02/02/22 0857  ? pantoprazole (PROTONIX) EC tablet 40 mg  40 mg Oral Q breakfast Adefeso, Oladapo, DO   40 mg at 02/02/22 0857  ? potassium chloride SA (KLOR-CON M) CR tablet 40 mEq  40 mEq Oral Once Kathie Dike, MD      ? rosuvastatin (CRESTOR) tablet 10 mg  10 mg Oral QPM Adefeso, Oladapo, DO   10 mg at 02/01/22 1838  ? tamsulosin (FLOMAX) capsule 0.4 mg  0.4 mg Oral Daily Adefeso, Oladapo, DO   0.4 mg at 02/02/22 0857  ? [START ON 02/03/2022] torsemide (DEMADEX) tablet 40 mg  40 mg Oral Daily Kathie Dike, MD      ? traZODone (DESYREL) tablet 50  mg  50 mg Oral QHS Kathie Dike, MD      ? ? ? ?Discharge Medications: ?Please see discharge summary for a list of discharge medications. ? ?Relevant Imaging Results: ? ?Relevant Lab Results: ? ? ?Additional Information ?SSN: [id number] ? ?Iona Beard, LCSWA ? ? ? ? ?

## 2022-02-02 NOTE — Progress Notes (Signed)
?  ?       ?PROGRESS NOTE ? ?Venkat Ankney BHA:[id number] DOB: Jan 12, 1941 DOA: 01/26/2022 ?PCP: [name], DO ? ?Brief History:  ?81 year old male with a history of recent NSTEMI 12/2021, diabetes mellitus type 2, hyperlipidemia, hypertension, paroxysmal atrial fibrillation, GERD, BPH, and CKD stage III presenting with shortness of breath and respiratory distress.  The patient presented with oxygen saturation 65% on room air.  He was initially placed on nonrebreather.  He was subsequently placed on BiPAP in the emergency department. ?He was recently admitted to the hospital from 01/08/2022 to 01/10/2022 for acute on chronic systolic and diastolic CHF.  He was discharged home with torsemide 20 mg daily.  He has been compliant with his medications and fluid restriction.  In fact, he has had decreased oral intake.  He has only gained 1 pound since discharge from the hospital.  His last weight was 194 pounds on the day of admission.  In addition, the patient also had a hospital admission from 12/24/2021 to 12/29/2021 for which he was treated medically for NSTEMI.  His troponin peaked >24K.  he was discharged home on Isordil, hydralazine, carvedilol, Crestor, and Plavix. ?Since discharge home from the hospital on 01/11/2022, the patient had remained short of breath with exertion.  His daughter states that he has essentially gradually declined since discharge.  After participating with occupational therapy on 01/25/2022, the patient became more short of breath as the day progressed.  He has not had any fevers, chills, headache, neck pain, chest pain, coughing, hemoptysis, nausea, vomiting or diarrhea. ?In the ED, the patient was afebrile hemodynamically stable with oxygen saturation 98-100% on BiPAP.  Troponins were flat 39>>40.  Initial labs showed WBC 9.0, Hgb 10.8, platelets 212, Na+ 136, K+ 4.1 and creatinine 2.17. AST 24 and ALT 25. Initial and repeat Hs Troponin flat at 40 and 39. BNP not yet ordered but will add-on to AM  labs. 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 EKG showed sinus rhythm with PVCs.  The patient was started on IV furosemide, bronchodilators, IV steroids, and azithromycin.  Cardiology and PCCM were consulted to assist  ? ? ? ?Assessment and Plan: ?* Acute on chronic respiratory failure with hypoxia and hypercapnia (HCC) ?Secondary to COPD exacerbation and CHF exacerbation ?At baseline, patient is on 3 L nasal cannula ?Wean oxygen for saturation greater 90% ?Intermittently requiring BiPAP throughout the day and at hs ?Little functional reserve ?--noncompliant with CPAP at home ?Appreciate PCCM consult>>use BiPAP for increase WOB prn and wean ?4/11 High Res CT chest>>Persistent bilateral bronchial wall thickening and interlobularseptal thickening, likely due to pulmonary edema. ? ?COPD exacerbation ([place]) ?Continue Pulmicort ?Continue brovana ?Continue DuoNebs ?IV steroids discontinued since he was not wheezing ?Intermittently requiring BiPAP during day and at hs ?Appreciate PCCM ?Does not qualify for home NIV/BiPAP ? ?Acute on chronic combined systolic and diastolic CHF (congestive heart failure) ([place]) ?3623 echo EF 35%, global HK, grade 2 DD ?He was being treated with intravenous Lasix 80 mg twice daily ?Diuretics subsequently held on 4/13 due to rising creatinine ?Plan to resume oral diuretics on 4/15 ?Daily weights--not accurate ?Accurate I's and O's-=-incomplete ?4/11 High Res CT chest>>Persistent bilateral bronchial wall thickening and interlobularseptal thickening, likely due to pulmonary edema. ?Appreciate cardiology consult ? ?Pleural effusion, right ?-S/p thoracentesis on 4/13 with removal of 1.4 L of fluid ?-Fluid analysis indicates transudative process ? ?CKD (chronic kidney disease), stage IV ([place]) ?Baseline creatinine 2.0-2.3 ?Creatinine up to 2.6, likely related to diuretics ?Diuretics held on 4/13  and creatinine down to 2.3 ?Plan to resume oral diuretics 4/15 ?Monitor with diuresis ? ?Paroxysmal atrial  fibrillation (HCC) ?Anticoagulated with apixaban.   ?Carvedilol changed to Toprol-XL ?Optimize potassium ?Currently in sinus ? ?Coronary atherosclerosis of native coronary artery ?No chest pain presently ?Personally reviewed EKG--sinus, LBBB, nonspecific STT ?Troponins are flat 39>>40>>36>>39 ?Continue Plavix, Crestor, Toprol ? ?Urine retention ?01/30/22--had serial In-N-Out catheterizations, but eventually had Foley catheter placed ?Started bethanechol ?He is on tamsulosin ?Will try voiding trial tomorrow ? ?BPH (benign prostatic hyperplasia) ?Continue tamsulosin ? ?Elevated troponin ?Trends are flat ?No chest pain ?Personally reviewed EKG--sinus, LBBB, nonspecific STT ?Secondary to demand ischemia ?4/7 limited echo--EF 30-35%, trivial pericardial effusion, global HK ?Appreciate cardiology consult ? ?Type 2 diabetes mellitus with hyperglycemia (HCC) ?12/24/2021 hemoglobin A1c 7.1 ?NovoLog sliding scale ?increase dose Semglee to 28 units ?Increase novolog 7 units with meals ? ? ?Essential hypertension ?Continue  Isordil ?Coreg changed to metoprolol ? ?Peripheral vascular disease ([place]) ?- He is s/p right fem-pop bypass at [facility] in 05/2018. Remains on Plavix and statin therapy.  ? ?Mixed hyperlipidemia ?Continue statin ? ?Generalized weakness ?-PT requested ? ?Status is: Inpatient ?Remains inpatient appropriate because: severity of illness requiring BiPAP  ?  ?  ?  ?Family Communication:   daughter updated 4/14 ?  ?Consultants:  cardiology, pulmonology ?  ?Code Status:  DNR ?  ?DVT Prophylaxis: apixaban ?  ?  ?Procedures: ?As Listed in Progress Note Above ?  ?Antibiotics: ?Azithro 4/7>>4/9 ?  ? ? ?Subjective: ?He does have some anxiety today.  Says he did not have any shortness of breath overnight, although he did briefly wear BiPAP overnight. ? ?Objective: ?Vitals:  ? 02/02/22 0808 02/02/22 0900 02/02/22 1000 02/02/22 1100  ?BP:  (!) 106/53 (!) 111/46 (!) 115/47  ?Pulse: 64 70 66 64  ?Resp: 16 12 18 15   ?Temp:  99.1 ?F (37.3 ?C)     ?TempSrc: Oral     ?SpO2: 99% 98% 95% 97%  ?Weight:      ?Height:      ? ? ?Intake/Output Summary (Last 24 hours) at 02/02/2022 1145 ?Last data filed at 02/02/2022 0600 ?Gross per 24 hour  ?Intake 720 ml  ?Output 975 ml  ?Net -255 ml  ? ?Weight change: -1.3 kg ?Exam: ? ?General exam: Alert, awake, oriented x 3 ?Respiratory system: crackles at right base. Respiratory effort normal. ?Cardiovascular system:RRR. No murmurs, rubs, gallops. ?Gastrointestinal system: Abdomen is nondistended, soft and nontender. No organomegaly or masses felt. Normal bowel sounds heard. ?Central nervous system: Alert and oriented. No focal neurological deficits. ?Extremities: No C/C/E, +pedal pulses ?Skin: No rashes, lesions or ulcers ?Psychiatry: Judgement and insight appear normal. Mood & affect appropriate.  ? ? ? ?Data Reviewed: ?I have personally reviewed following labs and imaging studies ?Basic Metabolic Panel: ?Recent Labs  ?Lab 01/29/22 ?0418 01/30/22 ?0419 01/31/22 ?0422 02/01/22 ?7902 02/02/22 ?0222  ?NA 134* 137 141 140 136  ?K 4.3 4.9 4.4 3.8 3.3*  ?CL 96* 96* 96* 98 98  ?CO2 30 31 34* 33* 28  ?GLUCOSE 289* 230* 200* 190* 147*  ?BUN 70* 75* 77* 84* 80*  ?CREATININE 2.73* 2.26* 2.13* 2.61* 2.39*  ?CALCIUM 8.3* 8.6* 9.1 8.2* 7.9*  ?MG 2.4 2.4 2.4 2.4 2.4  ? ?Liver Function Tests: ?Recent Labs  ?Lab 01/27/22 ?0313  ?AST 19  ?ALT 20  ?ALKPHOS 58  ?BILITOT 0.6  ?PROT 5.7*  ?ALBUMIN 3.0*  ? ?No results for input(s): LIPASE, AMYLASE in the last 168 hours. ?No results  for input(s): AMMONIA in the last 168 hours. ?Coagulation Profile: ?No results for input(s): INR, PROTIME in the last 168 hours. ?CBC: ?Recent Labs  ?Lab 01/27/22 ?0313 01/30/22 ?0419 01/31/22 ?0422 02/01/22 ?0420 02/02/22 ?0222  ?WBC 6.5 8.7 13.8* 8.3 9.0  ?HGB 8.7* 9.8* 12.2* 9.3* 9.6*  ?HCT 28.3* 31.1* 40.4 29.9* 32.1*  ?MCV 93.1 92.8 93.1 93.1 93.0  ?PLT 165 260 353 248 233  ? ?Cardiac Enzymes: ?No results for input(s): CKTOTAL, CKMB, CKMBINDEX,  TROPONINI in the last 168 hours. ?BNP: ?Invalid input(s): POCBNP ?CBG: ?Recent Labs  ?Lab 02/01/22 ?1123 02/01/22 ?1837 02/01/22 ?2136 02/02/22 ?5369 02/02/22 ?1117  ?GLUCAP 187* 269* 273* 96 203*  ? ?HbA1C: ?N

## 2022-02-02 NOTE — Plan of Care (Signed)
?  Problem: Acute Rehab PT Goals(only PT should resolve) ?Goal: Pt Will Go Supine/Side To Sit ?Outcome: Progressing ?Flowsheets (Taken 02/02/2022 1404) ?Pt will go Supine/Side to Sit: ? with min guard assist ? with minimal assist ?Goal: Patient Will Transfer Sit To/From Stand ?Outcome: Progressing ?Flowsheets (Taken 02/02/2022 1404) ?Patient will transfer sit to/from stand: ? with modified independence ? with supervision ?Goal: Pt Will Transfer Bed To Chair/Chair To Bed ?Outcome: Progressing ?Flowsheets (Taken 02/02/2022 1404) ?Pt will Transfer Bed to Chair/Chair to Bed: ? with modified independence ? with supervision ?Goal: Pt Will Ambulate ?Outcome: Progressing ?Flowsheets (Taken 02/02/2022 1404) ?Pt will Ambulate: ? 75 feet ? with supervision ? with rolling walker ?  ?2:04 PM, 02/02/22 ?Lonell Grandchild, MPT ?Physical Therapist with [place] ?[facility] ?[phone] office ?[phone] mobile phone ? ?

## 2022-02-02 NOTE — Progress Notes (Signed)
? ?Progress Note ? ?Patient Name: John [name] ?Date of Encounter: 02/02/2022 ? ?[facility] Cardiologist: Kirk Ruths, MD  ? ?Subjective  ? ?Feels better post thoracentesis eating breakfast  ? ?Inpatient Medications  ?  ?Scheduled Meds: ? apixaban  2.5 mg Oral BID  ? arformoterol  15 mcg Nebulization BID  ? budesonide (PULMICORT) nebulizer solution  0.5 mg Nebulization BID  ? chlorhexidine  15 mL Mouth Rinse BID  ? Chlorhexidine Gluconate Cloth  6 each Topical Q0600  ? clopidogrel  75 mg Oral Daily  ? gabapentin  300 mg Oral QHS  ? insulin aspart  0-5 Units Subcutaneous QHS  ? insulin aspart  0-9 Units Subcutaneous TID WC  ? insulin aspart  7 Units Subcutaneous TID WC  ? insulin glargine-yfgn  28 Units Subcutaneous QHS  ? ipratropium-albuterol  3 mL Nebulization TID  ? isosorbide dinitrate  20 mg Oral BID  ? mouth rinse  15 mL Mouth Rinse q12n4p  ? metoprolol succinate  50 mg Oral Daily  ? pantoprazole  40 mg Oral Q breakfast  ? potassium chloride  40 mEq Oral Once  ? rosuvastatin  10 mg Oral QPM  ? tamsulosin  0.4 mg Oral Daily  ? ?Continuous Infusions: ? sodium chloride Stopped (01/26/22 2157)  ? ?PRN Meds: ?sodium chloride, acetaminophen, alum & mag hydroxide-simeth, hydrALAZINE, ipratropium-albuterol  ? ?Vital Signs  ?  ?Vitals:  ? 02/02/22 0755 02/02/22 0758 02/02/22 0800 02/02/22 0808  ?BP:   (!) 120/41   ?Pulse:   63 64  ?Resp:   12 16  ?Temp:    99.1 ?F (37.3 ?C)  ?TempSrc:    Oral  ?SpO2: 97% 98% 98% 99%  ?Weight:      ?Height:      ? ? ?Intake/Output Summary (Last 24 hours) at 02/02/2022 0832 ?Last data filed at 02/02/2022 0600 ?Gross per 24 hour  ?Intake 720 ml  ?Output 1850 ml  ?Net -1130 ml  ? ? ?  02/02/2022  ?  5:00 AM 02/01/2022  ?  5:00 AM 01/31/2022  ?  4:32 AM  ?Last 3 Weights  ?Weight (lbs) 185 lb 3 oz 188 lb 0.8 oz 192 lb 7.4 oz  ?Weight (kg) 84 kg 85.3 kg 87.3 kg  ?   ? ?Telemetry  ?  ?SR, very frequent PVCs, ventricular bigeminy, NSVT - Personally Reviewed ? ?ECG  ?  ?No new tracing -  Personally Reviewed ? ?Physical Exam  ? ?Elderly male ?Decreased BS right base ?No murmur  ?Abdomen benign ?Plus one edema  ?Post right fempop  ? ?Labs  ?  ?High Sensitivity Troponin:   ?Recent Labs  ?Lab 01/08/22 ?2352 01/26/22 ?0432 01/26/22 ?7628 01/27/22 ?3151 01/27/22 ?7616  ?TROPONINIHS 118* 40* 39* 36* 39*  ?   ?Chemistry ?Recent Labs  ?Lab 01/27/22 ?0313 01/28/22 ?0737 01/31/22 ?0422 02/01/22 ?1062 02/02/22 ?0222  ?NA 135   < > 141 140 136  ?K 3.9   < > 4.4 3.8 3.3*  ?CL 99   < > 96* 98 98  ?CO2 28   < > 34* 33* 28  ?GLUCOSE 256*   < > 200* 190* 147*  ?BUN 44*   < > 77* 84* 80*  ?CREATININE 2.22*   < > 2.13* 2.61* 2.39*  ?CALCIUM 8.3*   < > 9.1 8.2* 7.9*  ?MG 2.0   < > 2.4 2.4 2.4  ?PROT 5.7*  --   --   --   --   ?ALBUMIN 3.0*  --   --   --   --   ?  AST 19  --   --   --   --   ?ALT 20  --   --   --   --   ?ALKPHOS 58  --   --   --   --   ?BILITOT 0.6  --   --   --   --   ?GFRNONAA 29*   < > 30* 24* 26*  ?ANIONGAP 8   < > 11 9 10   ? < > = values in this interval not displayed.  ?  ?Lipids No results for input(s): CHOL, TRIG, HDL, LABVLDL, LDLCALC, CHOLHDL in the last 168 hours.  ?Hematology ?Recent Labs  ?Lab 01/31/22 ?0422 02/01/22 ?0420 02/02/22 ?0222  ?WBC 13.8* 8.3 9.0  ?RBC 4.34 3.21* 3.45*  ?HGB 12.2* 9.3* 9.6*  ?HCT 40.4 29.9* 32.1*  ?MCV 93.1 93.1 93.0  ?MCH 28.1 29.0 27.8  ?MCHC 30.2 31.1 29.9*  ?RDW 15.4 15.4 15.3  ?PLT 353 248 233  ? ?Thyroid No results for input(s): TSH, FREET4 in the last 168 hours.  ?BNP ?Recent Labs  ?Lab 01/29/22 ?0418  ?BNP 968.0*  ?  ?DDimer No results for input(s): DDIMER in the last 168 hours.  ? ?Radiology  ?  ?DG Chest 1 View ? ?Result Date: 02/01/2022 ?CLINICAL DATA:  Status post right thoracentesis. EXAM: CHEST  1 VIEW COMPARISON:  Chest radiograph dated January 31, 2022 FINDINGS: The heart is enlarged. Atherosclerotic calcification of the aortic arch. Low lung volumes with bibasilar opacities representing atelectasis/effusions. No appreciable pneumothorax. IMPRESSION: No  appreciable pneumothorax. Electronically Signed   By: Keane Police D.O.   On: 02/01/2022 10:09  ? ?US THORACENTESIS ASP PLEURAL SPACE W/IMG GUIDE ? ?Result Date: 02/01/2022 ?INDICATION: Right pleural effusion Hypoxemia EXAM: ULTRASOUND GUIDED Right THORACENTESIS MEDICATIONS: 10 cc 1% lidocaine. COMPLICATIONS: None immediate. PROCEDURE: An ultrasound guided thoracentesis was thoroughly discussed with the patient and questions answered. The benefits, risks, alternatives and complications were also discussed. The patient understands and wishes to proceed with the procedure. Written consent was obtained. Ultrasound was performed to localize and mark an adequate pocket of fluid in the Right chest. The area was then prepped and draped in the normal sterile fashion. 1% Lidocaine was used for local anesthesia. Under ultrasound guidance a Yueh catheter was introduced. Thoracentesis was performed. The catheter was removed and a dressing applied. FINDINGS: A total of approximately 1.4 liters of clear yellow fluid was removed. Samples were sent to the laboratory as requested by the clinical team. IMPRESSION: Successful ultrasound guided right thoracentesis yielding 1.4 liters of pleural fluid. Post CXR: NO PTX per Dr Thornton Papas Read by Lavonia Drafts [facility] Electronically Signed   By: Lavonia Dana M.D.   On: 02/01/2022 10:18   ? ?Cardiac Studies  ? ?Limited echo 01/26/22 ? 1. Left ventricular ejection fraction, by estimation, is 30 to 35%. The  ?left ventricle has moderately decreased function. The left ventricle  ?demonstrates global hypokinesis. The left ventricular internal cavity size  ?was mildly to moderately dilated.  ?There is mild left ventricular hypertrophy.  ? 2. Right ventricular systolic function is normal. The right ventricular  ?size is normal.  ? 3. The inferior vena cava is dilated in size with >50% respiratory  ?variability, suggesting right atrial pressure of 8 mmHg.  ? ?Comparison(s): The left ventricular function is  worsened.  ?  ?Full echo 12/25/21 ? ? 1. Left ventricular ejection fraction, by estimation, is 35%. The left  ?ventricle has moderately decreased function. The left ventricle  ?  demonstrates global hypokinesis. The left ventricular internal cavity size  ?was mildly dilated. There is mild left  ?ventricular hypertrophy. Left ventricular diastolic parameters are  ?consistent with Grade II diastolic dysfunction (pseudonormalization).  ? 2. Right ventricular systolic function is normal. The right ventricular  ?size is normal. Tricuspid regurgitation signal is inadequate for assessing  ?PA pressure.  ? 3. The mitral valve is normal in structure. Trivial mitral valve  ?regurgitation. No evidence of mitral stenosis.  ? 4. The aortic valve is tricuspid. There is mild calcification of the  ?aortic valve. Aortic valve regurgitation is not visualized. No aortic  ?stenosis is present.  ?  ? ?Patient Profile  ?   ?81 y.o. 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 Had recent admission 12/2021 with troponins >24k and EF further declined to 35%, cath not pursued due to AKI on CKD with peak Cr 3.79. Readmitted 01/26/2022 with worsening dyspnea and hypoxia, felt multifactorial from CHF and COPD exacerbation for which Cardiology has been consulted.  ? ?Assessment & Plan  ?  ?#Acute on chronic hypoxic respiratory failure: ?#Acute on chronic HFrEF: ?Mixed etiology COPD/Respiratory failure and CHF  TTE this admission with EF 30-35%, relatively consistent with 12/2021 EF of 35%. Resume oral diuretic in am  ?-S/p thoracentesis with pulm ?-Continue metop 50mg  XL daily ?-Unable to tolerate ARNI or spiro due to AKI  on CKD ?-Monitor I/Os and daily weights ?-Management of COPD per primary and Pulm ? ?#CAD s/p multiple PCI; recent NSTEMI on medical management: ?Patient with history of multiple stents with last cath in

## 2022-02-02 NOTE — Progress Notes (Signed)
Initial Nutrition Assessment ? ?DOCUMENTATION CODES:  ? ?Not applicable ? ?INTERVENTION:  ?Ensure Enlive po BID ? ?Snack qhs from nursing nourishment room ? ?NUTRITION DIAGNOSIS:  ? ?Inadequate oral intake related to acute illness (short of breath and anxious) as evidenced by per patient/family report (0-25% the past 3 days). ? ? ?GOAL:  ?Patient will meet greater than or equal to 90% of their needs ? ? ?MONITOR:  ?PO intake, Supplement acceptance, Labs, I & O's, Weight trends ? ?REASON FOR ASSESSMENT:  ? ?Malnutrition Screening Tool ?  ? ?ASSESSMENT: Patient is a 81 yo male with hx of COPD, CHF, DM2, CKD-3, Stroke, CAD, HTN, GERD and depression. Presented with shortness of breath and respiratory distress. Recent admission during March related to CHF. Lives with daughter.  ? ?Pleural effusion - 4/13 US guided thoracentesis- 1.4 liters clear yellow fluid obtained.  ? ?Meal Completions: ?4/9-80-85% x 3 ?4/10-80-85% x3 ?4/11 50-80% x 2 ?4/12 0-5% x 2 ? ?No meal intake yesterday and today. Patient feeling anxious during RD visit. Reports that he is hungry but not eating as well due to the shortness of breath and anxiety. Able to feed himself. High risk for malnutrition.  ? ?Weight has decreased as expected with diuresis but patient is currently eating poorly as well. 88-89 kg the past year until April - currently 84 kg.  ? ?Medications: insulin, demadex (40 mg), crestor.  ? ? ? ?  Latest Ref Rng & Units 02/02/2022  ?  2:22 AM 02/01/2022  ?  4:20 AM 01/31/2022  ?  4:22 AM  ?BMP  ?Glucose 70 - 99 mg/dL 147   190   200    ?BUN 8 - 23 mg/dL 80   84   77    ?Creatinine 0.61 - 1.24 mg/dL 2.39   2.61   2.13    ?Sodium 135 - 145 mmol/L 136   140   141    ?Potassium 3.5 - 5.1 mmol/L 3.3   3.8   4.4    ?Chloride 98 - 111 mmol/L 98   98   96    ?CO2 22 - 32 mmol/L 28   33   34    ?Calcium 8.9 - 10.3 mg/dL 7.9   8.2   9.1    ?   ? ?NUTRITION - FOCUSED PHYSICAL EXAM: ? ?Flowsheet Row Most Recent Value  ?Orbital Region No depletion   ?Upper Arm Region No depletion  ?Thoracic and Lumbar Region Mild depletion  ?Buccal Region No depletion  ?Temple Region No depletion  ?Clavicle Bone Region No depletion  ?Clavicle and Acromion Bone Region Mild depletion  ?Scapular Bone Region No depletion  ?Dorsal Hand No depletion  ?Patellar Region Mild depletion  ?Anterior Thigh Region Mild depletion  ?Posterior Calf Region No depletion  ?Edema (RD Assessment) Mild  ?Hair Unable to assess  ?Eyes Reviewed  ?Mouth Reviewed  ?Skin Reviewed  ?Nails Reviewed  ? ?  ? ? ?Diet Order:   ?Diet Order   ? ?       ?  Diet Heart Room service appropriate? Yes; Fluid consistency: Thin  Diet effective now       ?  ? ?  ?  ? ?  ? ? ?EDUCATION NEEDS:  ?Not appropriate for education at this time ? ?Skin:  Skin Assessment: Reviewed RN Assessment ? ?Last BM:  4/13 type 4 ? ?Height:  ? ?Ht Readings from Last 1 Encounters:  ?01/26/22 5\' 7"  (1.702 m)  ? ? ?Weight:  ? ?  Wt Readings from Last 1 Encounters:  ?02/02/22 84 kg  ? ? ?Ideal Body Weight:   67 kg ? ?BMI:  Body mass index is 29 kg/m?. ? ?Estimated Nutritional Needs:  ? ?Kcal:  1800-2000 ? ?Protein:  87-95 gr ? ?Fluid:  < 2 iters daily ? ? ?Colman Cater MS,RD,CSG,LDN ?Contact: AMION  ?

## 2022-02-02 NOTE — Progress Notes (Signed)
Patient transferring to dept 300 room # 302, report given to receiving RN Izora Gala, patient's daughter notified & VM left that patient transferring to room # 302 ?

## 2022-02-02 NOTE — TOC Progression Note (Signed)
Transition of Care (TOC) - Progression Note  ? ? ?Patient Details  ?Name: John [name] ?MRN: [id number] ?Date of Birth: [birth year] ? ?Transition of Care (TOC) CM/SW Contact  ?Iona Beard, LCSWA ?Phone Number: ?02/02/2022, 1:24 PM ? ?Clinical Narrative:    ?CSW spoke with pt in room about PT recommendation of SNF. Pt is agreeable and would like CSW to speak with children. CSW spoke with both daughter and son who state they feel SNF would be best for pt. CSW sent pts referral out to local SNFs. Pts daughter states she will look into the local facilities. CSW to update family when there are bed offers. TOC to follow.  ? ?Expected Discharge Plan: [place] ?Barriers to Discharge: Continued Medical Work up ? ?Expected Discharge Plan and Services ?Expected Discharge Plan: [place] ?In-house Referral: Clinical Social Work ?Discharge Planning Services: CM Consult ?  ?Living arrangements for the past 2 months: [place] ?                ?  ?  ?  ?  ?  ?  ?  ?  ?  ?  ? ? ?Social Determinants of Health (SDOH) Interventions ?  ? ?Readmission Risk Interventions ? ?  01/29/2022  ? 10:39 AM 12/29/2021  ? 11:45 AM  ?Readmission Risk Prevention Plan  ?Transportation Screening Complete Complete  ?PCP or Specialist Appt within 3-5 Days  Complete  ?[place] or Home Care Consult  Complete  ?Social Work Consult for [place] Planning/Counseling  Complete  ?Palliative Care Screening  Not Applicable  ?Medication Review Press photographer) Complete Complete  ?[place] or Home Care Consult Complete   ?SW Recovery Care/Counseling Consult Complete   ?Palliative Care Screening Not Applicable   ?[place] Not Applicable   ? ? ?

## 2022-02-02 NOTE — Progress Notes (Signed)
RE: John [name] ?Date Of Birth: 1941-04-23 ?Date: 02/02/2022 ? ?MUST ID: [id number] ? ?To Whom It May Concern:  ? ?Please be advised that the above name patient will require a short-term nursing home stay - anticipated 30 days or less rehabilitation and strengthening. The plan is for return home.  ?

## 2022-02-03 DIAGNOSIS — N184 Chronic kidney disease, stage 4 (severe): Secondary | ICD-10-CM | POA: Diagnosis not present

## 2022-02-03 DIAGNOSIS — J9621 Acute and chronic respiratory failure with hypoxia: Secondary | ICD-10-CM | POA: Diagnosis not present

## 2022-02-03 DIAGNOSIS — I5043 Acute on chronic combined systolic (congestive) and diastolic (congestive) heart failure: Secondary | ICD-10-CM | POA: Diagnosis not present

## 2022-02-03 DIAGNOSIS — J90 Pleural effusion, not elsewhere classified: Secondary | ICD-10-CM | POA: Diagnosis not present

## 2022-02-03 LAB — BASIC METABOLIC PANEL
Anion gap: 7 (ref 5–15)
BUN: 64 mg/dL — ABNORMAL HIGH (ref 8–23)
CO2: 27 mmol/L (ref 22–32)
Calcium: 8.3 mg/dL — ABNORMAL LOW (ref 8.9–10.3)
Chloride: 103 mmol/L (ref 98–111)
Creatinine, Ser: 2.17 mg/dL — ABNORMAL HIGH (ref 0.61–1.24)
GFR, Estimated: 29 mL/min — ABNORMAL LOW (ref >60)
Glucose, Bld: 182 mg/dL — ABNORMAL HIGH (ref 70–99)
Potassium: 4.1 mmol/L (ref 3.5–5.1)
Sodium: 137 mmol/L (ref 135–145)

## 2022-02-03 LAB — CBC
HCT: 31.2 % — ABNORMAL LOW (ref 39.0–52.0)
Hemoglobin: 9.7 g/dL — ABNORMAL LOW (ref 13.0–17.0)
MCH: 29 pg (ref 26.0–34.0)
MCHC: 31.1 g/dL (ref 30.0–36.0)
MCV: 93.4 fL (ref 80.0–100.0)
Platelets: 247 10*3/uL (ref 150–400)
RBC: 3.34 MIL/uL — ABNORMAL LOW (ref 4.22–5.81)
RDW: 15.2 % (ref 11.5–15.5)
WBC: 8.2 10*3/uL (ref 4.0–10.5)
nRBC: 0 % (ref 0.0–0.2)

## 2022-02-03 LAB — MAGNESIUM: Magnesium: 2.3 mg/dL (ref 1.7–2.4)

## 2022-02-03 LAB — GLUCOSE, CAPILLARY
Glucose-Capillary: 183 mg/dL — ABNORMAL HIGH (ref 70–99)
Glucose-Capillary: 169 mg/dL — ABNORMAL HIGH (ref 70–99)
Glucose-Capillary: 390 mg/dL — ABNORMAL HIGH (ref 70–99)
Glucose-Capillary: 377 mg/dL — ABNORMAL HIGH (ref 70–99)

## 2022-02-03 NOTE — Progress Notes (Signed)
?  ?       ?PROGRESS NOTE ? ?John [name] GUR:[id number] DOB: [birth year] DOA: 01/26/2022 ?PCP: [name], DO ? ?Brief History:  ?81 year old male with a history of recent NSTEMI 12/2021, diabetes mellitus type 2, hyperlipidemia, hypertension, paroxysmal atrial fibrillation, GERD, BPH, and CKD stage III presenting with shortness of breath and respiratory distress.  The patient presented with oxygen saturation 65% on room air.  He was initially placed on nonrebreather.  He was subsequently placed on BiPAP in the emergency department. ?He was recently admitted to the hospital from 01/08/2022 to 01/10/2022 for acute on chronic systolic and diastolic CHF.  He was discharged home with torsemide 20 mg daily.  He has been compliant with his medications and fluid restriction.  In fact, he has had decreased oral intake.  He has only gained 1 pound since discharge from the hospital.  His last weight was 194 pounds on the day of admission.  In addition, the patient also had a hospital admission from 12/24/2021 to 12/29/2021 for which he was treated medically for NSTEMI.  His troponin peaked >24K.  he was discharged home on Isordil, hydralazine, carvedilol, Crestor, and Plavix. ?Since discharge home from the hospital on 01/11/2022, the patient had remained short of breath with exertion.  His daughter states that he has essentially gradually declined since discharge.  After participating with occupational therapy on 01/25/2022, the patient became more short of breath as the day progressed.  He has not had any fevers, chills, headache, neck pain, chest pain, coughing, hemoptysis, nausea, vomiting or diarrhea. ?In the ED, the patient was afebrile hemodynamically stable with oxygen saturation 98-100% on BiPAP.  Troponins were flat 39>>40.  Initial labs showed WBC 9.0, Hgb 10.8, platelets 212, Na+ 136, K+ 4.1 and creatinine 2.17. AST 24 and ALT 25. Initial and repeat Hs Troponin flat at 40 and 39. BNP not yet ordered but will add-on to AM  labs. 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 EKG showed sinus rhythm with PVCs.  The patient was started on IV furosemide, bronchodilators, IV steroids, and azithromycin.  Cardiology and PCCM were consulted to assist  ? ? ? ?Assessment and Plan: ?* Acute on chronic respiratory failure with hypoxia and hypercapnia (HCC) ?Secondary to COPD exacerbation and CHF exacerbation ?At baseline, patient is on 3 L nasal cannula ?Wean oxygen for saturation greater 90% ?Intermittently requiring BiPAP throughout the day and at hs ?Little functional reserve ?--noncompliant with CPAP at home ?Appreciate PCCM consult>>use BiPAP for increase WOB prn and wean ?4/11 High Res CT chest>>Persistent bilateral bronchial wall thickening and interlobularseptal thickening, likely due to pulmonary edema. ? ?COPD exacerbation ([place]) ?Continue Pulmicort ?Continue brovana ?Continue DuoNebs ?IV steroids discontinued since he was not wheezing ?He is using BiPAP if develops severe respiratory distress ?Appreciate PCCM ?Does not qualify for home NIV/BiPAP ? ?Acute on chronic combined systolic and diastolic CHF (congestive heart failure) ([place]) ?3623 echo EF 35%, global HK, grade 2 DD ?He was being treated with intravenous Lasix 80 mg twice daily ?Diuretics subsequently held on 4/13 due to rising creatinine ?Will resume oral diuretics on today ?Daily weights--not accurate ?Accurate I's and O's-=-incomplete ?4/11 High Res CT chest>>Persistent bilateral bronchial wall thickening and interlobularseptal thickening, likely due to pulmonary edema. ?Appreciate cardiology consult ? ?Pleural effusion, right ?-S/p thoracentesis on 4/13 with removal of 1.4 L of fluid ?-Fluid analysis indicates transudative process ? ?CKD (chronic kidney disease), stage IV ([place]) ?Baseline creatinine 2.0-2.3 ?Creatinine up to 2.6, likely related to diuretics ?Diuretics held on 4/13  and creatinine down to 2.1 today ?Restarted on diuretics with torsemide ?Monitor with  diuresis ? ?Paroxysmal atrial fibrillation (HCC) ?Anticoagulated with apixaban.   ?Carvedilol changed to Toprol-XL ?Optimize potassium ?Currently in sinus ? ?Coronary atherosclerosis of native coronary artery ?No chest pain presently ?Personally reviewed EKG--sinus, LBBB, nonspecific STT ?Troponins are flat 39>>40>>36>>39 ?Continue Plavix, Crestor, Toprol ? ?Urine retention ?01/30/22--had serial In-N-Out catheterizations, but eventually had Foley catheter placed ?Started bethanechol ?He is on tamsulosin ?We will attempt voiding trial today ? ?BPH (benign prostatic hyperplasia) ?Continue tamsulosin ? ?Elevated troponin ?Trends are flat ?No chest pain ?Personally reviewed EKG--sinus, LBBB, nonspecific STT ?Secondary to demand ischemia ?4/7 limited echo--EF 30-35%, trivial pericardial effusion, global HK ?Appreciate cardiology consult ? ?Type 2 diabetes mellitus with hyperglycemia (HCC) ?12/24/2021 hemoglobin A1c 7.1 ?NovoLog sliding scale ?increase dose Semglee to 28 units ?Increase novolog 7 units with meals ? ? ?Essential hypertension ?Continue  Isordil ?Coreg changed to metoprolol ? ?Peripheral vascular disease ([place]) ?- He is s/p right fem-pop bypass at [facility] in 05/2018. Remains on Plavix and statin therapy.  ? ?Mixed hyperlipidemia ?Continue statin ? ?Generalized weakness ?-PT requested ? ?Status is: Inpatient ?Remains inpatient appropriate because: severity of illness requiring BiPAP  ?  ?  ?  ?Family Communication:   daughter updated 4/14 ?  ?Consultants:  cardiology, pulmonology ?  ?Code Status:  DNR ?  ?DVT Prophylaxis: apixaban ?  ?  ?Procedures: ?As Listed in Progress Note Above ?  ?Antibiotics: ?Azithro 4/7>>4/9 ?  ? ? ?Subjective: ?Sitting up in chair.  Reports that he did not sleep well last night.  Continues to have issues with anxiety.  Overall breathing does feel better. ? ?Objective: ?Vitals:  ? 02/03/22 0500 02/03/22 0735 02/03/22 1332 02/03/22 1407  ?BP:    (!) 132/59  ?Pulse:    77  ?Resp:    20   ?Temp:    98.4 ?F (36.9 ?C)  ?TempSrc:    Oral  ?SpO2:  93% 94% 98%  ?Weight: 84.3 kg     ?Height:      ? ? ?Intake/Output Summary (Last 24 hours) at 02/03/2022 1837 ?Last data filed at 02/03/2022 1700 ?Gross per 24 hour  ?Intake 840 ml  ?Output 2500 ml  ?Net -1660 ml  ? ?Weight change: 0.3 kg ?Exam: ? ?General exam: Alert, awake, oriented x 3 ?Respiratory system: crackles at right base. Respiratory effort normal. ?Cardiovascular system:RRR. No murmurs, rubs, gallops. ?Gastrointestinal system: Abdomen is nondistended, soft and nontender. No organomegaly or masses felt. Normal bowel sounds heard. ?Central nervous system: Alert and oriented. No focal neurological deficits. ?Extremities: No C/C/E, +pedal pulses ?Skin: No rashes, lesions or ulcers ?Psychiatry: Judgement and insight appear normal. Mood & affect appropriate.  ? ? ? ?Data Reviewed: ?I have personally reviewed following labs and imaging studies ?Basic Metabolic Panel: ?Recent Labs  ?Lab 01/30/22 ?0419 01/31/22 ?0422 02/01/22 ?0420 02/02/22 ?0222 02/03/22 ?7425  ?NA 137 141 140 136 137  ?K 4.9 4.4 3.8 3.3* 4.1  ?CL 96* 96* 98 98 103  ?CO2 31 34* 33* 28 27  ?GLUCOSE 230* 200* 190* 147* 182*  ?BUN 75* 77* 84* 80* 64*  ?CREATININE 2.26* 2.13* 2.61* 2.39* 2.17*  ?CALCIUM 8.6* 9.1 8.2* 7.9* 8.3*  ?MG 2.4 2.4 2.4 2.4 2.3  ? ?Liver Function Tests: ?No results for input(s): AST, ALT, ALKPHOS, BILITOT, PROT, ALBUMIN in the last 168 hours. ? ?No results for input(s): LIPASE, AMYLASE in the last 168 hours. ?No results for input(s): AMMONIA in the last 168  hours. ?Coagulation Profile: ?No results for input(s): INR, PROTIME in the last 168 hours. ?CBC: ?Recent Labs  ?Lab 01/30/22 ?0419 01/31/22 ?0422 02/01/22 ?0420 02/02/22 ?0222 02/03/22 ?0321  ?WBC 8.7 13.8* 8.3 9.0 8.2  ?HGB 9.8* 12.2* 9.3* 9.6* 9.7*  ?HCT 31.1* 40.4 29.9* 32.1* 31.2*  ?MCV 92.8 93.1 93.1 93.0 93.4  ?PLT 260 353 248 233 247  ? ?Cardiac Enzymes: ?No results for input(s): CKTOTAL, CKMB, CKMBINDEX,  TROPONINI in the last 168 hours. ?BNP: ?Invalid input(s): POCBNP ?CBG: ?Recent Labs  ?Lab 02/02/22 ?1612 02/02/22 ?1933 02/03/22 ?2248 02/03/22 ?1116 02/03/22 ?1706  ?GLUCAP 101* 91 183* 169* 390*  ? ?HbA1C: ?No results for inp

## 2022-02-03 NOTE — Progress Notes (Signed)
Patient wanted to watch tv , refused BiPAP at this time. Patient will call. ?

## 2022-02-03 NOTE — Progress Notes (Signed)
Patient removed BiPAP , sleeping with oxygen  3 liters [place]. ?

## 2022-02-03 NOTE — Progress Notes (Signed)
Patient did not wear bipap entire shift.  Patient has had no issues entire shift. ?

## 2022-02-03 NOTE — Progress Notes (Signed)
Patient has voided with no c/o burning or pain.  ?

## 2022-02-03 NOTE — Plan of Care (Signed)

## 2022-02-04 DIAGNOSIS — J90 Pleural effusion, not elsewhere classified: Secondary | ICD-10-CM | POA: Diagnosis not present

## 2022-02-04 DIAGNOSIS — I5043 Acute on chronic combined systolic (congestive) and diastolic (congestive) heart failure: Secondary | ICD-10-CM | POA: Diagnosis not present

## 2022-02-04 DIAGNOSIS — N184 Chronic kidney disease, stage 4 (severe): Secondary | ICD-10-CM | POA: Diagnosis not present

## 2022-02-04 DIAGNOSIS — J9621 Acute and chronic respiratory failure with hypoxia: Secondary | ICD-10-CM | POA: Diagnosis not present

## 2022-02-04 LAB — CBC
HCT: 29.3 % — ABNORMAL LOW (ref 39.0–52.0)
Hemoglobin: 9.3 g/dL — ABNORMAL LOW (ref 13.0–17.0)
MCH: 28.9 pg (ref 26.0–34.0)
MCHC: 31.7 g/dL (ref 30.0–36.0)
MCV: 91 fL (ref 80.0–100.0)
Platelets: 229 10*3/uL (ref 150–400)
RBC: 3.22 MIL/uL — ABNORMAL LOW (ref 4.22–5.81)
RDW: 14.8 % (ref 11.5–15.5)
WBC: 8.6 10*3/uL (ref 4.0–10.5)
nRBC: 0 % (ref 0.0–0.2)

## 2022-02-04 LAB — BASIC METABOLIC PANEL
Anion gap: 6 (ref 5–15)
BUN: 59 mg/dL — ABNORMAL HIGH (ref 8–23)
CO2: 29 mmol/L (ref 22–32)
Calcium: 8.5 mg/dL — ABNORMAL LOW (ref 8.9–10.3)
Chloride: 101 mmol/L (ref 98–111)
Creatinine, Ser: 2.15 mg/dL — ABNORMAL HIGH (ref 0.61–1.24)
GFR, Estimated: 30 mL/min — ABNORMAL LOW (ref >60)
Glucose, Bld: 204 mg/dL — ABNORMAL HIGH (ref 70–99)
Potassium: 4 mmol/L (ref 3.5–5.1)
Sodium: 136 mmol/L (ref 135–145)

## 2022-02-04 LAB — GLUCOSE, CAPILLARY
Glucose-Capillary: 201 mg/dL — ABNORMAL HIGH (ref 70–99)
Glucose-Capillary: 269 mg/dL — ABNORMAL HIGH (ref 70–99)
Glucose-Capillary: 263 mg/dL — ABNORMAL HIGH (ref 70–99)
Glucose-Capillary: 249 mg/dL — ABNORMAL HIGH (ref 70–99)

## 2022-02-04 LAB — BLOOD GAS, ARTERIAL
Acid-Base Excess: 6.2 mmol/L — ABNORMAL HIGH (ref 0.0–2.0)
Bicarbonate: 32.7 mmol/L — ABNORMAL HIGH (ref 20.0–28.0)
Drawn by: 22179
FIO2: 32 %
O2 Saturation: 74.4 %
Patient temperature: 36.7
pCO2 arterial: 53 mmHg — ABNORMAL HIGH (ref 32–48)
pH, Arterial: 7.39 (ref 7.35–7.45)
pO2, Arterial: 42 mmHg — ABNORMAL LOW (ref 83–108)

## 2022-02-04 MED ORDER — INSULIN GLARGINE-YFGN 100 UNIT/ML ~~LOC~~ SOLN
35.0000 [IU] | Freq: Every day | SUBCUTANEOUS | Status: DC
  Administered 2022-02-04 – 2022-02-05 (×2): 35 [IU] via SUBCUTANEOUS
  Filled 2022-02-04 (×3): qty 0.35

## 2022-02-04 MED ORDER — ALPRAZOLAM 0.25 MG PO TABS
0.2500 mg | ORAL_TABLET | Freq: Two times a day (BID) | ORAL | Status: DC | PRN
  Administered 2022-02-04 – 2022-02-05 (×2): 0.25 mg via ORAL
  Filled 2022-02-04 (×2): qty 1

## 2022-02-04 NOTE — Progress Notes (Signed)
Ask for BIPAP about 0400 ?

## 2022-02-04 NOTE — Plan of Care (Signed)

## 2022-02-04 NOTE — Progress Notes (Signed)
Patient reported some anxiety that occurred earlier prior to start of shift, he stated that he was having some feelings of not being able to catch his breath. He is comfortable at this time, and call bell within reach.  ?

## 2022-02-04 NOTE — Progress Notes (Signed)
?  ?       ?PROGRESS NOTE ? ?John [name] QAS:[id number] DOB: Jun 29, 1941 DOA: 01/26/2022 ?PCP: [name], DO ? ?Brief History:  ?81 year old male with a history of recent NSTEMI 12/2021, diabetes mellitus type 2, hyperlipidemia, hypertension, paroxysmal atrial fibrillation, GERD, BPH, and CKD stage III presenting with shortness of breath and respiratory distress.  The patient presented with oxygen saturation 65% on room air.  He was initially placed on nonrebreather.  He was subsequently placed on BiPAP in the emergency department. ?He was recently admitted to the hospital from 01/08/2022 to 01/10/2022 for acute on chronic systolic and diastolic CHF.  He was discharged home with torsemide 20 mg daily.  He has been compliant with his medications and fluid restriction.  In fact, he has had decreased oral intake.  He has only gained 1 pound since discharge from the hospital.  His last weight was 194 pounds on the day of admission.  In addition, the patient also had a hospital admission from 12/24/2021 to 12/29/2021 for which he was treated medically for NSTEMI.  His troponin peaked >24K.  he was discharged home on Isordil, hydralazine, carvedilol, Crestor, and Plavix. ?Since discharge home from the hospital on 01/11/2022, the patient had remained short of breath with exertion.  His daughter states that he has essentially gradually declined since discharge.  After participating with occupational therapy on 01/25/2022, the patient became more short of breath as the day progressed.  He has not had any fevers, chills, headache, neck pain, chest pain, coughing, hemoptysis, nausea, vomiting or diarrhea. ?In the ED, the patient was afebrile hemodynamically stable with oxygen saturation 98-100% on BiPAP.  Troponins were flat 39>>40.  Initial labs showed WBC 9.0, Hgb 10.8, platelets 212, Na+ 136, K+ 4.1 and creatinine 2.17. AST 24 and ALT 25. Initial and repeat Hs Troponin flat at 40 and 39. BNP not yet ordered but will add-on to AM  labs. 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 EKG showed sinus rhythm with PVCs.  The patient was started on IV furosemide, bronchodilators, IV steroids, and azithromycin.  Cardiology and PCCM were consulted to assist  ? ? ? ?Assessment and Plan: ?* Acute on chronic respiratory failure with hypoxia and hypercapnia (HCC) ?Secondary to COPD exacerbation and CHF exacerbation ?At baseline, patient is on 3 L nasal cannula ?Wean oxygen for saturation greater 90% ?Intermittently requiring BiPAP throughout the day and at hs ?Little functional reserve ?--noncompliant with CPAP at home ?Appreciate PCCM consult>>use BiPAP for increase WOB prn and wean ?4/11 High Res CT chest>>Persistent bilateral bronchial wall thickening and interlobularseptal thickening, likely due to pulmonary edema. ? ?COPD exacerbation ([place]) ?Continue Pulmicort ?Continue brovana ?Continue DuoNebs ?IV steroids discontinued since he was not wheezing ?He is using BiPAP if develops severe respiratory distress ?Appreciate PCCM ?Does not qualify for home NIV/BiPAP ? ?Acute on chronic combined systolic and diastolic CHF (congestive heart failure) ([place]) ?3623 echo EF 35%, global HK, grade 2 DD ?He was being treated with intravenous Lasix 80 mg twice daily ?Diuretics subsequently held on 4/13 due to rising creatinine ?He was restarted on oral diuretics on 4/15 ?Daily weights--not accurate ?Accurate I's and O's-=-incomplete ?4/11 High Res CT chest>>Persistent bilateral bronchial wall thickening and interlobularseptal thickening, likely due to pulmonary edema. ?Appreciate cardiology consult ? ?Pleural effusion, right ?-S/p thoracentesis on 4/13 with removal of 1.4 L of fluid ?-Fluid analysis indicates transudative process ? ?CKD (chronic kidney disease), stage IV ([place]) ?Baseline creatinine 2.0-2.3 ?Creatinine up to 2.6, likely related to diuretics ?Diuretics held  on 4/13 and creatinine down to 2.1 today ?Restarted on diuretics with torsemide ?Monitor with  diuresis ? ?Paroxysmal atrial fibrillation (HCC) ?Anticoagulated with apixaban.   ?Carvedilol changed to Toprol-XL ?Optimize potassium ?Currently in sinus ? ?Coronary atherosclerosis of native coronary artery ?No chest pain presently ?Personally reviewed EKG--sinus, LBBB, nonspecific STT ?Troponins are flat 39>>40>>36>>39 ?Continue Plavix, Crestor, Toprol ? ?Acute urinary retention ?01/30/22--had serial In-N-Out catheterizations, but eventually had Foley catheter placed ?He is on tamsulosin ?Foley catheter removed on 4/15, patient is passing urine without difficulty ? ?BPH (benign prostatic hyperplasia) ?Continue tamsulosin ? ?Elevated troponin ?Trends are flat ?No chest pain ?Personally reviewed EKG--sinus, LBBB, nonspecific STT ?Secondary to demand ischemia ?4/7 limited echo--EF 30-35%, trivial pericardial effusion, global HK ?Appreciate cardiology consult ? ?Type 2 diabetes mellitus with hyperglycemia (HCC) ?12/24/2021 hemoglobin A1c 7.1 ?NovoLog sliding scale ?increase dose Semglee to 28 units ?Increase novolog 7 units with meals ? ? ?Essential hypertension ?Continue  Isordil ?Coreg changed to metoprolol ? ?Peripheral vascular disease ([place]) ?- He is s/p right fem-pop bypass at [facility] in 05/2018. Remains on Plavix and statin therapy.  ? ?Mixed hyperlipidemia ?Continue statin ? ?Generalized weakness ?-Seen by physical therapy with recommendations for skilled nursing facility ? ?Status is: Inpatient ?Remains inpatient appropriate because: severity of illness requiring BiPAP  ?  ?  ?  ?Family Communication:   Son updated 4/16 ?  ?Consultants:  cardiology, pulmonology ?  ?Code Status:  DNR ?  ?DVT Prophylaxis: apixaban ?  ?  ?Procedures: ?As Listed in Progress Note Above ?  ?Antibiotics: ?Azithro 4/7>>4/9 ?  ? ? ?Subjective: ?Feels sleepy this morning, but admits to getting Xanax earlier in the morning.  Feels that his breathing is improving ? ?Objective: ?Vitals:  ? 02/04/22 0603 02/04/22 0847 02/04/22 1339  02/04/22 1417  ?BP: 135/61   (!) 117/54  ?Pulse: 63   71  ?Resp: 20   20  ?Temp: 98.6 ?F (37 ?C)   98.1 ?F (36.7 ?C)  ?TempSrc:    Oral  ?SpO2: 98% 98% 92% 95%  ?Weight:      ?Height:      ? ? ?Intake/Output Summary (Last 24 hours) at 02/04/2022 1817 ?Last data filed at 02/04/2022 1300 ?Gross per 24 hour  ?Intake 1120 ml  ?Output --  ?Net 1120 ml  ? ?Weight change:  ?Exam: ? ?General exam: Alert, awake, oriented x 3 ?Respiratory system: Clear to auscultation. Respiratory effort normal. ?Cardiovascular system:RRR. No murmurs, rubs, gallops. ?Gastrointestinal system: Abdomen is nondistended, soft and nontender. No organomegaly or masses felt. Normal bowel sounds heard. ?Central nervous system: Alert and oriented. No focal neurological deficits. ?Extremities: No C/C/E, +pedal pulses ?Skin: No rashes, lesions or ulcers ?Psychiatry: Judgement and insight appear normal. Mood & affect appropriate.  ? ? ? ? ?Data Reviewed: ?I have personally reviewed following labs and imaging studies ?Basic Metabolic Panel: ?Recent Labs  ?Lab 01/30/22 ?0419 01/31/22 ?0422 02/01/22 ?0420 02/02/22 ?0222 02/03/22 ?7253 02/04/22 ?6644  ?NA 137 141 140 136 137 136  ?K 4.9 4.4 3.8 3.3* 4.1 4.0  ?CL 96* 96* 98 98 103 101  ?CO2 31 34* 33* 28 27 29   ?GLUCOSE 230* 200* 190* 147* 182* 204*  ?BUN 75* 77* 84* 80* 64* 59*  ?CREATININE 2.26* 2.13* 2.61* 2.39* 2.17* 2.15*  ?CALCIUM 8.6* 9.1 8.2* 7.9* 8.3* 8.5*  ?MG 2.4 2.4 2.4 2.4 2.3  --   ? ?Liver Function Tests: ?No results for input(s): AST, ALT, ALKPHOS, BILITOT, PROT, ALBUMIN in the last 168 hours. ? ?  No results for input(s): LIPASE, AMYLASE in the last 168 hours. ?No results for input(s): AMMONIA in the last 168 hours. ?Coagulation Profile: ?No results for input(s): INR, PROTIME in the last 168 hours. ?CBC: ?Recent Labs  ?Lab 01/31/22 ?0422 02/01/22 ?0420 02/02/22 ?0222 02/03/22 ?5456 02/04/22 ?2563  ?WBC 13.8* 8.3 9.0 8.2 8.6  ?HGB 12.2* 9.3* 9.6* 9.7* 9.3*  ?HCT 40.4 29.9* 32.1* 31.2* 29.3*  ?MCV  93.1 93.1 93.0 93.4 91.0  ?PLT 353 248 233 247 229  ? ?Cardiac Enzymes: ?No results for input(s): CKTOTAL, CKMB, CKMBINDEX, TROPONINI in the last 168 hours. ?BNP: ?Invalid input(s): POCBNP ?CBG: ?Recent Labs  ?Lab 02/03/22

## 2022-02-05 DIAGNOSIS — I480 Paroxysmal atrial fibrillation: Secondary | ICD-10-CM | POA: Diagnosis not present

## 2022-02-05 DIAGNOSIS — I2510 Atherosclerotic heart disease of native coronary artery without angina pectoris: Secondary | ICD-10-CM | POA: Diagnosis not present

## 2022-02-05 DIAGNOSIS — I5043 Acute on chronic combined systolic (congestive) and diastolic (congestive) heart failure: Secondary | ICD-10-CM | POA: Diagnosis not present

## 2022-02-05 DIAGNOSIS — J441 Chronic obstructive pulmonary disease with (acute) exacerbation: Secondary | ICD-10-CM | POA: Diagnosis not present

## 2022-02-05 DIAGNOSIS — N184 Chronic kidney disease, stage 4 (severe): Secondary | ICD-10-CM | POA: Diagnosis not present

## 2022-02-05 DIAGNOSIS — N401 Enlarged prostate with lower urinary tract symptoms: Secondary | ICD-10-CM

## 2022-02-05 DIAGNOSIS — J9621 Acute and chronic respiratory failure with hypoxia: Secondary | ICD-10-CM | POA: Diagnosis not present

## 2022-02-05 LAB — BODY FLUID CELL COUNT WITH DIFFERENTIAL
Eos, Fluid: 0 %
Lymphs, Fluid: 76 %
Monocyte-Macrophage-Serous Fluid: 13 % — ABNORMAL LOW (ref 50–90)
Neutrophil Count, Fluid: 11 % (ref 0–25)
Total Nucleated Cell Count, Fluid: 202 cu mm (ref 0–1000)

## 2022-02-05 LAB — GLUCOSE, CAPILLARY
Glucose-Capillary: 137 mg/dL — ABNORMAL HIGH (ref 70–99)
Glucose-Capillary: 235 mg/dL — ABNORMAL HIGH (ref 70–99)
Glucose-Capillary: 148 mg/dL — ABNORMAL HIGH (ref 70–99)
Glucose-Capillary: 249 mg/dL — ABNORMAL HIGH (ref 70–99)

## 2022-02-05 LAB — BASIC METABOLIC PANEL
Anion gap: 8 (ref 5–15)
BUN: 59 mg/dL — ABNORMAL HIGH (ref 8–23)
CO2: 30 mmol/L (ref 22–32)
Calcium: 8.7 mg/dL — ABNORMAL LOW (ref 8.9–10.3)
Chloride: 99 mmol/L (ref 98–111)
Creatinine, Ser: 2.14 mg/dL — ABNORMAL HIGH (ref 0.61–1.24)
GFR, Estimated: 30 mL/min — ABNORMAL LOW (ref >60)
Glucose, Bld: 159 mg/dL — ABNORMAL HIGH (ref 70–99)
Potassium: 3.9 mmol/L (ref 3.5–5.1)
Sodium: 137 mmol/L (ref 135–145)

## 2022-02-05 MED ORDER — COVID-19MRNA BIVAL VACC PFIZER 30 MCG/0.3ML IM SUSP
0.3000 mL | Freq: Once | INTRAMUSCULAR | Status: AC
  Administered 2022-02-05: 0.3 mL via INTRAMUSCULAR
  Filled 2022-02-05: qty 0.3

## 2022-02-05 MED ORDER — TORSEMIDE 20 MG PO TABS: 40.0000 mg | ORAL_TABLET | Freq: Every day | ORAL | 0 refills | Status: DC

## 2022-02-05 MED ORDER — MOMETASONE FURO-FORMOTEROL FUM 200-5 MCG/ACT IN AERO: 2.0000 | INHALATION_SPRAY | Freq: Two times a day (BID) | RESPIRATORY_TRACT | Status: DC

## 2022-02-05 MED ORDER — IPRATROPIUM-ALBUTEROL 0.5-2.5 (3) MG/3ML IN SOLN
3.0000 mL | Freq: Three times a day (TID) | RESPIRATORY_TRACT | Status: DC
Start: 2022-02-05 — End: 2022-05-21

## 2022-02-05 MED ORDER — LANTUS SOLOSTAR 100 UNIT/ML ~~LOC~~ SOPN: 35.0000 [IU] | PEN_INJECTOR | Freq: Every day | SUBCUTANEOUS | 11 refills | Status: DC

## 2022-02-05 MED ORDER — METOPROLOL SUCCINATE ER 50 MG PO TB24: 50.0000 mg | ORAL_TABLET | Freq: Every day | ORAL | Status: DC

## 2022-02-05 MED ORDER — COVID-19MRNA BIVAL VAC MODERNA 50 MCG/0.5ML IM SUSP
0.5000 mL | Freq: Once | INTRAMUSCULAR | Status: DC
  Filled 2022-02-05: qty 0.5

## 2022-02-05 MED ORDER — ALPRAZOLAM 0.25 MG PO TABS: 0.2500 mg | ORAL_TABLET | Freq: Two times a day (BID) | ORAL | 3 refills | Status: DC | PRN

## 2022-02-05 MED ORDER — HYDROCODONE-ACETAMINOPHEN 5-325 MG PO TABS: 1.0000 | ORAL_TABLET | Freq: Three times a day (TID) | ORAL | 0 refills | Status: DC | PRN

## 2022-02-05 MED ORDER — INSULIN ASPART 100 UNIT/ML IJ SOLN: 7.0000 [IU] | Freq: Three times a day (TID) | INTRAMUSCULAR | 11 refills | Status: DC

## 2022-02-05 NOTE — Progress Notes (Signed)
Attempted report on patient to [facility]. ?

## 2022-02-05 NOTE — Care Management Important Message (Signed)
Important Message ? ?Patient Details  ?Name: John [name] ?MRN: [id number] ?Date of Birth: 1941-04-11 ? ? ?Medicare Important Message Given:  Yes ? ? ? ? ?Tommy Medal ?02/05/2022, 12:59 PM ?

## 2022-02-05 NOTE — TOC Transition Note (Signed)
Transition of Care (TOC) - CM/SW Discharge Note ? ? ?Patient Details  ?Name: John [name] ?MRN: [id number] ?Date of Birth: 02-Apr-1941 ? ?Transition of Care (TOC) CM/SW Contact:  ?Lory Nowaczyk D, LCSW ?Phone Number: ?02/05/2022, 3:02 PM ? ? ?Clinical Narrative:    ?D/c clinicals sent to facility. RN to call report. EMS to provide transport. TOC signing off.  ? ? ?Final next level of care: [place] ?Barriers to Discharge: No Barriers Identified ? ? ?Patient Goals and CMS Choice ?Patient states their goals for this hospitalization and ongoing recovery are:: Return home ?CMS Medicare.gov Compare Post Acute Care list provided to:: Patient ?Choice offered to / list presented to : Patient ? ?Discharge Placement ?  ?           ?Patient chooses bed at: [facility] ?Patient to be transferred to facility by: [facility] ?Name of family member notified: son and daughter ?Patient and family notified of of transfer: 02/05/22 ? ?Discharge Plan and Services ?In-house Referral: Clinical Social Work ?Discharge Planning Services: CM Consult ?           ?  ?  ?  ?  ?  ?  ?  ?  ?  ?  ? ?Social Determinants of Health (SDOH) Interventions ?  ? ? ?Readmission Risk Interventions ? ?  01/29/2022  ? 10:39 AM 12/29/2021  ? 11:45 AM  ?Readmission Risk Prevention Plan  ?Transportation Screening Complete Complete  ?PCP or Specialist Appt within 3-5 Days  Complete  ?[place] or Home Care Consult  Complete  ?Social Work Consult for [place] Planning/Counseling  Complete  ?Palliative Care Screening  Not Applicable  ?Medication Review Press photographer) Complete Complete  ?[place] or Home Care Consult Complete   ?SW Recovery Care/Counseling Consult Complete   ?Palliative Care Screening Not Applicable   ?[place] Not Applicable   ? ? ? ? ? ?

## 2022-02-05 NOTE — Progress Notes (Signed)
Attempted to call report to [facility]. Declined to take report at this time. ?

## 2022-02-05 NOTE — Progress Notes (Addendum)
? ?Progress Note ? ?Patient Name: John [name] ?Date of Encounter: 02/05/2022 ? ?[facility] Cardiologist: Kirk Ruths, MD   ? ?Subjective  ? ?Breathing improved, receiving breathing treatment currently ? ?Inpatient Medications  ?  ?Scheduled Meds: ? apixaban  2.5 mg Oral BID  ? arformoterol  15 mcg Nebulization BID  ? budesonide (PULMICORT) nebulizer solution  0.5 mg Nebulization BID  ? chlorhexidine  15 mL Mouth Rinse BID  ? clopidogrel  75 mg Oral Daily  ? feeding supplement  237 mL Oral BID BM  ? gabapentin  300 mg Oral QHS  ? insulin aspart  0-5 Units Subcutaneous QHS  ? insulin aspart  0-9 Units Subcutaneous TID WC  ? insulin aspart  7 Units Subcutaneous TID WC  ? insulin glargine-yfgn  35 Units Subcutaneous QHS  ? ipratropium-albuterol  3 mL Nebulization TID  ? isosorbide dinitrate  20 mg Oral BID  ? mouth rinse  15 mL Mouth Rinse q12n4p  ? metoprolol succinate  50 mg Oral Daily  ? pantoprazole  40 mg Oral Q breakfast  ? rosuvastatin  10 mg Oral QPM  ? tamsulosin  0.4 mg Oral Daily  ? torsemide  40 mg Oral Daily  ? traZODone  50 mg Oral QHS  ? ?Continuous Infusions: ? sodium chloride Stopped (01/26/22 2157)  ? ?PRN Meds: ?sodium chloride, acetaminophen, ALPRAZolam, alum & mag hydroxide-simeth, hydrALAZINE, ipratropium-albuterol  ? ?Vital Signs  ?  ?Vitals:  ? 02/04/22 2342 02/05/22 5465 02/05/22 0538 02/05/22 0747  ?BP:   117/66   ?Pulse: 71 70 70   ?Resp: 20 16 18    ?Temp:   98.1 ?F (36.7 ?C)   ?TempSrc:      ?SpO2: 96%  97% 98%  ?Weight:   83.6 kg   ?Height:      ? ? ?Intake/Output Summary (Last 24 hours) at 02/05/2022 0755 ?Last data filed at 02/05/2022 0546 ?Gross per 24 hour  ?Intake 880 ml  ?Output --  ?Net 880 ml  ? ? ?  02/05/2022  ?  5:38 AM 02/04/2022  ?  6:37 PM 02/03/2022  ?  5:00 AM  ?Last 3 Weights  ?Weight (lbs) 184 lb 4.9 oz 184 lb 8.4 oz 185 lb 13.6 oz  ?Weight (kg) 83.6 kg 83.7 kg 84.3 kg  ?   ? ?Telemetry  ?  ?NSR with PVC's/bigeminy - Personally Reviewed ? ?ECG  ?  ? ?Physical Exam  ?   ?GEN: No acute distress.   ?Neck: No JVD ?Cardiac: RRR, no murmurs, rubs, or gallops.  ?Respiratory: basilar rales/rhochi ?GI: Soft, nontender, non-distended  ?MS: No edema; No deformity. ?Neuro:  Nonfocal  ?Psych: Normal affect  ? ?Labs  ?  ?High Sensitivity Troponin:   ?Recent Labs  ?Lab 01/08/22 ?2352 01/26/22 ?0432 01/26/22 ?6812 01/27/22 ?7517 01/27/22 ?0017  ?TROPONINIHS 118* 40* 39* 36* 39*  ?   ?Chemistry ?Recent Labs  ?Lab 02/01/22 ?0420 02/02/22 ?0222 02/03/22 ?4944 02/04/22 ?9675 02/05/22 ?0413  ?NA 140 136 137 136 137  ?K 3.8 3.3* 4.1 4.0 3.9  ?CL 98 98 103 101 99  ?CO2 33* 28 27 29 30   ?GLUCOSE 190* 147* 182* 204* 159*  ?BUN 84* 80* 64* 59* 59*  ?CREATININE 2.61* 2.39* 2.17* 2.15* 2.14*  ?CALCIUM 8.2* 7.9* 8.3* 8.5* 8.7*  ?MG 2.4 2.4 2.3  --   --   ?GFRNONAA 24* 26* 29* 30* 30*  ?ANIONGAP 9 10 7 6 8   ?  ?Lipids No results for input(s): CHOL, TRIG, HDL, LABVLDL,  LDLCALC, CHOLHDL in the last 168 hours.  ?Hematology ?Recent Labs  ?Lab 02/02/22 ?0222 02/03/22 ?7001 02/04/22 ?0332  ?WBC 9.0 8.2 8.6  ?RBC 3.45* 3.34* 3.22*  ?HGB 9.6* 9.7* 9.3*  ?HCT 32.1* 31.2* 29.3*  ?MCV 93.0 93.4 91.0  ?MCH 27.8 29.0 28.9  ?MCHC 29.9* 31.1 31.7  ?RDW 15.3 15.2 14.8  ?PLT 233 247 229  ? ?Thyroid No results for input(s): TSH, FREET4 in the last 168 hours.  ?BNPNo results for input(s): BNP, PROBNP in the last 168 hours.  ?DDimer No results for input(s): DDIMER in the last 168 hours.  ? ?Radiology  ?  ?No results found. ? ?Cardiac Studies  ? ? Limited echo 01/26/22 ? 1. Left ventricular ejection fraction, by estimation, is 30 to 35%. The  ?left ventricle has moderately decreased function. The left ventricle  ?demonstrates global hypokinesis. The left ventricular internal cavity size  ?was mildly to moderately dilated.  ?There is mild left ventricular hypertrophy.  ? 2. Right ventricular systolic function is normal. The right ventricular  ?size is normal.  ? 3. The inferior vena cava is dilated in size with >50% respiratory   ?variability, suggesting right atrial pressure of 8 mmHg.  ? ?Comparison(s): The left ventricular function is worsened.  ?  ?Full echo 12/25/21 ? ? 1. Left ventricular ejection fraction, by estimation, is 35%. The left  ?ventricle has moderately decreased function. The left ventricle  ?demonstrates global hypokinesis. The left ventricular internal cavity size  ?was mildly dilated. There is mild left  ?ventricular hypertrophy. Left ventricular diastolic parameters are  ?consistent with Grade II diastolic dysfunction (pseudonormalization).  ? 2. Right ventricular systolic function is normal. The right ventricular  ?size is normal. Tricuspid regurgitation signal is inadequate for assessing  ?PA pressure.  ? 3. The mitral valve is normal in structure. Trivial mitral valve  ?regurgitation. No evidence of mitral stenosis.  ? 4. The aortic valve is tricuspid. There is mild calcification of the  ?aortic valve. Aortic valve regurgitation is not visualized. No aortic  ?stenosis is present.  ?  ? ?Patient Profile  ?   ?81 y.o. 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 Had recent admission 12/2021 with troponins >24k and EF further declined to 35%, cath not pursued due to AKI on CKD with peak Cr 3.79. Readmitted 01/26/2022 with worsening dyspnea and hypoxia, felt multifactorial from CHF and COPD exacerbation for which Cardiology has been consulted.  ?  ? ?Assessment & Plan  ?  ?Acute on chronic resp failure/Acute on chronic HFrEF: ?Mixed etiology COPD/Respiratory failure and CHF  TTE this admission with EF 30-35%, relatively consistent with 12/2021 EF of 35%. -S/p thoracentesis with  pulm ?-Continue metop 50mg  XL daily ?-Unable to tolerate ARNI or spiro due to AKI on CKD-Crt improved 2.14 on demadex 40 mg daily. Apprears compensated from CHF standpoint ?-I/Os  -4.7L and daily weights ?-Management of COPD per primary and Pulm ?  ?#CAD s/p multiple PCI; recent NSTEMI on medical management: ?Patient with history of multiple stents with last cath in 2013 showing EF of 40% with DESx2 to LAD. Had NSTEMI in 12/2021 with troponin >24k &  EF of 35% suggestive of progressive underlying CAD; medical management pursued given AKI Troponin values are low/flat (peak 40), not consistent with ACS ?-Continue plavix 75mg  daily ?-Continue metoprolol 50mg  XL daily ?-Continue crestor 10mg  daily ?-Continue isordil 20mg  BID ?  ?#Paroxysmal atrial fibrillation: ?#Frequent PVCs ?Maintaining NSR  ?- Continue metop 50mg  XL daily ?- On Eliquis 2.5 mg bid due to age/renal ?  ?#HLD ?- FLP in 12/2021 showed his LDL was at 29 ?- continue rosuvastatin 10mg  daily ?  ?#PAD ?- He is s/p right fem-pop bypass at [facility] in 05/2018 ?- remains on Plavix and statin therapy ?  ?#AKI on Stage 3-4 CKD ?-Cr improved 2.14 he is negative I/O > L and had 1.4 L thoracentesis  ?  ?  ?#Anemia ?-Management per IM Hct improved 32.1 this am  ?  ?   ?  ?   ? ?For questions or updates, please contact [place] ?Please consult [email or web address] for contact info under  ? ?  ?   ?Signed, ?Ermalinda Barrios, PA-C  ?02/05/2022, 7:55 AM   ? ?Patient examined chart reviewed Exam with COPD no wheezing getting breathing Rx. Converted from afib to NSR continue renal dose eliquis No angina CHF compensated continue current dose demedex.  No ARNI/ACE/ARB due to renal failure Cr improved  ? ?Cardiology will sign off  ? ?Jenkins Rouge MD [facility] ? ?

## 2022-02-05 NOTE — Progress Notes (Signed)
Nursing Discharge Note ? ?Admit Date:  01/26/2022 ?Discharge date: 02/05/2022 ?  ?[place] to be D/C'd Skilled nursing facility per MD order.  AVS completed. ? ?Packet created for transport of patient to [facility]. ? ?Discharge Medication: ?Allergies as of 02/05/2022   ? ?   Reactions  ? Brilinta [ticagrelor] Shortness Of Breath  ? Penicillins Hives  ? Ezetimibe-simvastatin Other (See Comments)  ? Myalgia  ? ?  ? ?  ?Medication List  ?  ? ?STOP taking these medications   ? ?carvedilol 12.5 MG tablet ?Commonly known as: COREG ?  ?hydrALAZINE 25 MG tablet ?Commonly known as: APRESOLINE ?  ?senna 8.6 MG tablet ?Commonly known as: SENOKOT ?  ? ?  ? ?TAKE these medications   ? ?albuterol 108 (90 Base) MCG/ACT inhaler ?Commonly known as: VENTOLIN HFA ?Inhale 2 puffs into the lungs every 6 (six) hours as needed for wheezing or shortness of breath. ?  ?allopurinol 300 MG tablet ?Commonly known as: ZYLOPRIM ?Take 300 mg by mouth daily. ?  ?ALPRAZolam 0.25 MG tablet ?Commonly known as: Duanne Moron ?Take 1 tablet (0.25 mg total) by mouth 2 (two) times daily as needed for anxiety. ?  ?apixaban 2.5 MG Tabs tablet ?Commonly known as: ELIQUIS ?Take 1 tablet (2.5 mg total) by mouth 2 (two) times daily. ?  ?BD Pen Needle Nano U/F 32G X 4 MM Misc ?Generic drug: Insulin Pen Needle ?Use to inject insulin up to 4 times daily. ?  ?CertaVite/Antioxidants Tabs ?Take 1 tablet by mouth daily. ?  ?cholecalciferol 25 MCG (1000 UNIT) tablet ?Commonly known as: VITAMIN D3 ?Take 1,000 Units by mouth daily. ?  ?clopidogrel 75 MG tablet ?Commonly known as: PLAVIX ?Take 1 tablet (75 mg total) by mouth daily. ?  ?folic acid 1 MG tablet ?Commonly known as: FOLVITE ?Take 1 tablet (1 mg total) by mouth daily. ?  ?gabapentin 300 MG capsule ?Commonly known as: NEURONTIN ?Take 1 capsule (300 mg total) by mouth at bedtime. ?  ?HYDROcodone-acetaminophen 5-325 MG tablet ?Commonly known as: NORCO/VICODIN ?Take 1 tablet by mouth every 8 (eight) hours as needed for  moderate pain. ?What changed: when to take this ?  ?insulin aspart 100 UNIT/ML injection ?Commonly known as: novoLOG ?Inject 7 Units into the skin 3 (three) times daily with meals. ?  ?ipratropium-albuterol 0.5-2.5 (3) MG/3ML Soln ?Commonly known as: DUONEB ?Take 3 mLs by nebulization 3 (three) times daily. ?  ?isosorbide dinitrate 20 MG tablet ?Commonly known as: ISORDIL ?Take 1 tablet (20 mg total) by mouth 2 (two) times daily. ?  ?Lantus SoloStar 100 UNIT/ML Solostar Pen ?Generic drug: insulin glargine ?Inject 35 Units into the skin daily. Dose per sliding scale. ?What changed: how much to take ?  ?metoprolol succinate 50 MG 24 hr tablet ?Commonly known as: TOPROL-XL ?Take 1 tablet (50 mg total) by mouth daily. Take with or immediately following a meal. ?Start taking on: February 06, 2022 ?  ?mometasone-formoterol 200-5 MCG/ACT Aero ?Commonly known as: DULERA ?Inhale 2 puffs into the lungs in the morning and at bedtime. ?  ?pantoprazole 40 MG tablet ?Commonly known as: PROTONIX ?Take 40 mg by mouth daily with breakfast. ?  ?rosuvastatin 10 MG tablet ?Commonly known as: CRESTOR ?Take 1 tablet (10 mg total) by mouth every evening. ?  ?sertraline 25 MG tablet ?Commonly known as: ZOLOFT ?Take 1 tablet (25 mg total) by mouth every evening. ?  ?tamsulosin 0.4 MG Caps capsule ?Commonly known as: FLOMAX ?Take 0.4 mg by mouth daily. ?  ?thiamine  100 MG tablet ?Take 1 tablet (100 mg total) by mouth daily. ?  ?torsemide 20 MG tablet ?Commonly known as: DEMADEX ?Take 2 tablets (40 mg total) by mouth daily. ?What changed: how much to take ?  ?VITAMIN B 12 PO ?Take 1 tablet by mouth daily. ?  ? ?  ? ? ?Discharge Assessment: ?Vitals:  ? 02/05/22 1341 02/05/22 1424  ?BP: (!) 147/62   ?Pulse: 96   ?Resp: 16   ?Temp: 98.5 ?F (36.9 ?C)   ?SpO2: 100% 98%  ? Skin clean, dry and intact without evidence of skin break down, no evidence of skin tears noted. ?IV catheter discontinued intact. Site without signs and symptoms of complications -  no redness or edema noted at insertion site, patient denies c/o pain - only slight tenderness at site.  Dressing with slight pressure applied. ? ?D/c Instructions-Education: ? ?Facility packet with AVS discussing patient's meds and care completed. Nursing attempted x2 to give report to facility. Patient is aware of discharge and ongoing care. Patient with his own belongings returned.  ? ? ?Alfonse Alpers, RN ?02/05/2022 2:54 PM   ?

## 2022-02-05 NOTE — Discharge Summary (Addendum)
Physician Discharge Summary  ?John [name] GHW:[id number] DOB: 1941-08-06 DOA: 01/26/2022 ? ?PCP: [name], DO ? ?Admit date: 01/26/2022 ?Discharge date: 02/05/2022 ? ?Admitted From: home ?Disposition:  SNF ? ?Recommendations for Outpatient Follow-up:  ?Follow up with PCP in 1-2 weeks ?Please obtain BMP/CBC in one week ?Follow up with primary cardiologist Dr. [name] in next 2 weeks ?Follow up with pulmonology, referral has been made ? ?Discharge Condition: stable ?CODE STATUS:DNR ?Diet recommendation: heart healthy, carb modified ? ?Brief/Interim Summary: ?81 year old male with a history of recent NSTEMI 12/2021, diabetes mellitus type 2, hyperlipidemia, hypertension, paroxysmal atrial fibrillation, GERD, BPH, and CKD stage III presenting with shortness of breath and respiratory distress.  The patient presented with oxygen saturation 65% on room air.  He was initially placed on nonrebreather.  He was subsequently placed on BiPAP in the emergency department. ?He was recently admitted to the hospital from 01/08/2022 to 01/10/2022 for acute on chronic systolic and diastolic CHF.  He was discharged home with torsemide 20 mg daily.  He has been compliant with his medications and fluid restriction.  In fact, he has had decreased oral intake.  He has only gained 1 pound since discharge from the hospital.  His last weight was 194 pounds on the day of admission.  In addition, the patient also had a hospital admission from 12/24/2021 to 12/29/2021 for which he was treated medically for NSTEMI.  His troponin peaked >24K.  he was discharged home on Isordil, hydralazine, carvedilol, Crestor, and Plavix. ?Since discharge home from the hospital on 01/11/2022, the patient had remained short of breath with exertion.  His daughter states that he has essentially gradually declined since discharge.  After participating with occupational therapy on 01/25/2022, the patient became more short of breath as the day progressed.  He has not had any  fevers, chills, headache, neck pain, chest pain, coughing, hemoptysis, nausea, vomiting or diarrhea. ?In the ED, the patient was afebrile hemodynamically stable with oxygen saturation 98-100% on BiPAP.  Troponins were flat 39>>40.  Initial labs showed WBC 9.0, Hgb 10.8, platelets 212, Na+ 136, K+ 4.1 and creatinine 2.17. AST 24 and ALT 25. Initial and repeat Hs Troponin flat at 40 and 39. BNP not yet ordered but will add-on to AM labs. 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 EKG showed sinus rhythm with PVCs.  The patient was started on IV furosemide, bronchodilators, IV steroids, and azithromycin.  Cardiology and PCCM were consulted to assist  ? ?Discharge Diagnoses:  ?Principal Problem: ?  Acute on chronic respiratory failure with hypoxia and hypercapnia (HCC) ?Active Problems: ?  COPD exacerbation ([place]) ?  Acute on chronic combined systolic and diastolic CHF (congestive heart failure) ([place]) ?  CKD (chronic kidney disease), stage IV ([place]) ?  Paroxysmal atrial fibrillation (HCC) ?  Coronary atherosclerosis of native coronary artery ?  Mixed hyperlipidemia ?  Peripheral vascular disease ([name]) ?  Essential hypertension ?  Type 2 diabetes mellitus with hyperglycemia (HCC) ?  Elevated troponin ?  BPH (benign prostatic hyperplasia) ?  Urine retention ?  Pleural effusion, right ? ?Acute on chronic respiratory failure with hypoxia and hypercapnia (HCC) ?Secondary to COPD exacerbation and CHF exacerbation ?At baseline, patient is on 3 L nasal cannula ?Little functional reserve ?--noncompliant with CPAP at home ?Appreciate PCCM consult>>used BiPAP for increase WOB prn and weaned off ?4/11 High Res CT chest>>Persistent bilateral bronchial wall thickening and interlobularseptal thickening, likely due to pulmonary edema. ?  ?COPD exacerbation ([name]) ?Continue Pulmicort ?Continue brovana ?Continue DuoNebs ?IV steroids  discontinued since he was not wheezing ?Appreciate PCCM ?Does not qualify for home NIV/BiPAP ?Transition to dulera  on discharge, continue nebs ?  ?Acute on chronic combined systolic and diastolic CHF (congestive heart failure) ([name]) ?3623 echo EF 35%, global HK, grade 2 DD ?He was initially being treated with intravenous Lasix 80 mg twice daily ?Diuretics subsequently held on 4/13 due to rising creatinine ?He was restarted on oral diuretics on 4/15 ?Daily weights--not accurate ?Accurate I's and O's-=-incomplete ?4/11 High Res CT chest>>Persistent bilateral bronchial wall thickening and interlobularseptal thickening, likely due to pulmonary edema. ?Appreciate cardiology consult ?Appears to be approaching euvolemia ?Discharge weight is 184 lbs ?  ?Pleural effusion, right ?-S/p thoracentesis on 4/13 with removal of 1.4 L of fluid ?-Fluid analysis indicates transudative process ?  ?CKD (chronic kidney disease), stage IV ([name]) ?Baseline creatinine 2.0-2.3 ?Creatinine up to 2.6, likely related to diuretics ?Diuretics held on 4/13 and creatinine down to 2.1 today ?Restarted on diuretics with torsemide ?Creatinine has been stable ? ?  ?Paroxysmal atrial fibrillation (HCC) ?Anticoagulated with apixaban.   ?Carvedilol changed to Toprol-XL ?HR stable ?Currently in sinus ?  ?Coronary atherosclerosis of native coronary artery ?No chest pain presently ?Personally reviewed EKG--sinus, LBBB, nonspecific STT ?Troponins are flat 39>>40>>36>>39 ?Continue Plavix, Crestor, Toprol ?  ?Acute urinary retention ?01/30/22--had serial In-N-Out catheterizations, but eventually had Foley catheter placed ?He is on tamsulosin ?Foley catheter removed on 4/15, patient is passing urine without difficulty ?  ?BPH (benign prostatic hyperplasia) ?Continue tamsulosin ?  ?Elevated troponin ?Trends are flat ?No chest pain ?Personally reviewed EKG--sinus, LBBB, nonspecific STT ?Secondary to demand ischemia ?4/7 limited echo--EF 30-35%, trivial pericardial effusion, global HK ?Appreciate cardiology consult ?  ?Type 2 diabetes mellitus with hyperglycemia (HCC) ?12/24/2021  hemoglobin A1c 7.1 ?NovoLog sliding scale ?increase dose Semglee to 35 units ?Increase novolog 7 units with meals ? ?Obstructive sleep apnea ?-encouraged patient to continue QHS CPAP ?  ?  ?Essential hypertension ?Continue  Isordil ?Coreg changed to metoprolol ?BP stable ?  ?Peripheral vascular disease ([place]) ?- He is s/p right fem-pop bypass at [facility] in 05/2018. Remains on Plavix and statin therapy.  ?  ?Mixed hyperlipidemia ?Continue statin ?  ?Generalized weakness ?-Seen by physical therapy with recommendations for skilled nursing facility ? ?Discharge Instructions ? ?Discharge Instructions   ? ? Diet - low sodium heart healthy   Complete by: As directed ?  ? Increase activity slowly   Complete by: As directed ?  ? ?  ? ?Allergies as of 02/05/2022   ? ?   Reactions  ? Brilinta [ticagrelor] Shortness Of Breath  ? Penicillins Hives  ? Ezetimibe-simvastatin Other (See Comments)  ? Myalgia  ? ?  ? ?  ?Medication List  ?  ? ?STOP taking these medications   ? ?carvedilol 12.5 MG tablet ?Commonly known as: COREG ?  ?hydrALAZINE 25 MG tablet ?Commonly known as: APRESOLINE ?  ?senna 8.6 MG tablet ?Commonly known as: SENOKOT ?  ? ?  ? ?TAKE these medications   ? ?albuterol 108 (90 Base) MCG/ACT inhaler ?Commonly known as: VENTOLIN HFA ?Inhale 2 puffs into the lungs every 6 (six) hours as needed for wheezing or shortness of breath. ?  ?allopurinol 300 MG tablet ?Commonly known as: ZYLOPRIM ?Take 300 mg by mouth daily. ?  ?ALPRAZolam 0.25 MG tablet ?Commonly known as: Duanne Moron ?Take 1 tablet (0.25 mg total) by mouth 2 (two) times daily as needed for anxiety. ?  ?apixaban 2.5 MG Tabs tablet ?Commonly known as: ELIQUIS ?  Take 1 tablet (2.5 mg total) by mouth 2 (two) times daily. ?  ?BD Pen Needle Nano U/F 32G X 4 MM Misc ?Generic drug: Insulin Pen Needle ?Use to inject insulin up to 4 times daily. ?  ?CertaVite/Antioxidants Tabs ?Take 1 tablet by mouth daily. ?  ?cholecalciferol 25 MCG (1000 UNIT) tablet ?Commonly known as:  VITAMIN D3 ?Take 1,000 Units by mouth daily. ?  ?clopidogrel 75 MG tablet ?Commonly known as: PLAVIX ?Take 1 tablet (75 mg total) by mouth daily. ?  ?folic acid 1 MG tablet ?Commonly known as: FOLVITE ?Take

## 2022-02-05 NOTE — Progress Notes (Signed)
Report called to [facility]. ?

## 2022-02-06 LAB — CULTURE, BODY FLUID W GRAM STAIN -BOTTLE
Culture: NO GROWTH
Special Requests: ADEQUATE

## 2022-02-06 LAB — GLUCOSE, CAPILLARY: Glucose-Capillary: 193 mg/dL — ABNORMAL HIGH (ref 70–99)

## 2022-02-06 NOTE — Consult Note (Signed)
[facility] CM Inpatient Consult ? ? ?02/06/2022 ? ?John [name] ?1941-05-13 ?[id number] ?  ?[place] [facility] CM) ?  ?Patient chart screened with noted extreme risk score for unplanned readmission and less than 30 days readmission. Assessed for [facility] CM post hospital chronic care needs. Per review, current disposition is for SNF. No [facility] CM needs. ?  ?Of note, [facility] Care Management services does not replace or interfere with any services that are arranged by inpatient case management or social work.  ?  ?Netta Cedars, MSN, RN ?[facility] Liaison ?Phone [phone] ?Toll free office [phone] ?

## 2022-02-06 NOTE — Progress Notes (Signed)
Patient still in room this morning.  He did not discharge yesterday afternoon, likely related to transportation logistics.  Reports that he did feel short some shortness of breath this morning, but this resolved after nebulizer treatment.  At this time, he sitting up in bed having breakfast.  Appears to be breathing comfortably.  Lungs are clear bilaterally.  Appears to be euvolemic.  Appears reasonable to continue plans for discharge today to skilled nursing facility.  Please refer to discharge summary done yesterday for further details on discharge medications and further plan of care. ? ?Kathie Dike ? ?

## 2022-02-08 ENCOUNTER — Telehealth: Payer: Self-pay | Admitting: Cardiology

## 2022-02-08 NOTE — Telephone Encounter (Signed)
Patient is calling because he was recently in the hospital and carvedilol 12.5mg  and hydralazine 25mg  are not on his discharge papers of his of medication list.  He wants to make sure that he should not be taking them.   ?

## 2022-02-08 NOTE — Telephone Encounter (Signed)
Called pt to let him know his discharge paperwork from the hospital states he is to stop taking Hydralazine, Carvedilol and Senna. He verbalized understanding and thanked me for calling him.  ?

## 2022-02-09 ENCOUNTER — Other Ambulatory Visit (HOSPITAL_COMMUNITY): Payer: Self-pay

## 2022-02-20 ENCOUNTER — Ambulatory Visit (INDEPENDENT_AMBULATORY_CARE_PROVIDER_SITE_OTHER): Payer: Medicare Other | Admitting: Pulmonary Disease

## 2022-02-20 ENCOUNTER — Encounter: Payer: Self-pay | Admitting: Pulmonary Disease

## 2022-02-20 DIAGNOSIS — J90 Pleural effusion, not elsewhere classified: Secondary | ICD-10-CM | POA: Diagnosis not present

## 2022-02-20 DIAGNOSIS — I25810 Atherosclerosis of coronary artery bypass graft(s) without angina pectoris: Secondary | ICD-10-CM

## 2022-02-20 DIAGNOSIS — J9611 Chronic respiratory failure with hypoxia: Secondary | ICD-10-CM

## 2022-02-20 DIAGNOSIS — J441 Chronic obstructive pulmonary disease with (acute) exacerbation: Secondary | ICD-10-CM | POA: Diagnosis not present

## 2022-02-20 DIAGNOSIS — G4733 Obstructive sleep apnea (adult) (pediatric): Secondary | ICD-10-CM

## 2022-02-20 NOTE — Patient Instructions (Signed)
? ?  X schedule pFTs ? ?X ambulatory sat ? ?X Adjust CPAP to auto 5-14 cm ?EPR setting of 3 ?Blend O2 into CPAP ? ?Let us know if this is more cofortable ?

## 2022-02-20 NOTE — Assessment & Plan Note (Signed)
Severe OSA was noted on baseline study from 2021 which were reviewed at [facility]. ?He tolerated BiPAP better during the hospital but did not tolerate high pressure of 14 cm that his CPAP is set at. ?We will change him to auto settings 5 to 14 cm and also add an EPR setting of 3 hopefully he will tolerate this better. ?We will ask DME to blend oxygen into his CPAP machine ? ?If he is unable to tolerate CPAP in spite of these changes, then we can consider using a BiPAP for him ?Weight loss encouraged, compliance with goal of at least 4-6 hrs every night is the expectation. ?Advised against medications with sedative side effects ?Cautioned against driving when sleepy - understanding that sleepiness will vary on a day to day basis ? ?

## 2022-02-20 NOTE — Progress Notes (Signed)
? ?Subjective:  ? ? Patient ID: John [name], male    DOB: 1941-08-04, 81 y.o.   MRN: [id number] ? ?HPI ? ?81 year old ex-smoker presents to establish care after hospitalization for acute hypoxic respiratory failure due to acute systolic heart failure with underlying COPD and OSA ? ? admitted 4/7 for shortness of breath, respiratory distress and found to be hypoxic to 65% on room air, initially required nonrebreather and then BiPAP.  Initial labs significant for no leukocytosis, hemoglobin 10.8, creatinine 2.2 .  Chest x-ray showed CHF superimposed on hyperinflation.  BNP was 710 ?PCCM consulted on 4/10 due to persistent BiPAP requirement ?  ?Recent admission 5/70/1779 for acute systolic heart failure and NSTEMI, cardiac cath deferred due to AKI, peak creatinine 3.8.  His shortness of breath persisted after admission and gradually worsened ? ? ?PMH - -Ischemic cardiomyopathy , DES x2 to LAD , Non-STEMI 12/2021, cath not performed due to AKI ?-HFrEF, EF 35% in 12/2021 ?-Paroxysmal atrial fibrillation ?-Type 2 diabetes ?-PAD status post right femoropopliteal bypass at [facility] 05/2018 ?-CKD stage IIIb ?-CVA, ambulates with a cane ?-Smoked 40 pack years before he quit at age 71 ? ?OSA was diagnosed in 2021 at [facility] and he was placed on CPAP 14 cm with 1 L of oxygen blended in.  However he did not tolerate the high pressure.  He tolerated the BiPAP much better during the hospital stays and wonders if he can switch to BiPAP. ?He was discharged on 4/17 to rehab facility and now is on home PT.  He was discharged on 3 L of oxygen which he is compliant with during sleep and uses it as needed in the daytime. ? ?He reports dyspnea is much improved ?Epworth Sleepiness Scale is 15 and he reports some sleepiness while sitting and reading, watching TV, or as a passenger in a car ? ?He was discharged on [facility] and albuterol MDI and if he wonders if he should be using these medications.  He was receiving DuoNebs during hospital  stay ? ?I reviewed hospital course/discharge summary, cardiology consultation, [facility] records. ? ?Bedtime is between 10 and 11 PM, sleep latency about 45 minutes, reports 2-3 nocturnal awakenings, out of bed around 9:30 AM ? ?Significant tests/ events reviewed ? ?Echo 01/26/22  EF 30 to 35%, global hypokinesis, grade 2 diastolic dysfunction ?CT chest/abdomen/pelvis 12/24/2021 severe multilobar bronchopneumonia With small bilateral effusions ?HRCT/10/23 decreased bilateral consolidations , evidence of pulmonary edema with bilateral right more than left effusions, cannot evaluate for ILD ?4/13 thoracenteses >> 1.4 L, transudate ,LDH 46 and protein less than 3 ?09/13/2020 PSG ( Did not qualify for Split protocol : Severe OSA with AHI: 30, REM AHI: 60, CAI: 0, No CSR. O2 nadir: 87 % with total time < 88%: 7 min. TCO2: 37-43. Poor sleep efficiency  ? ? ? ? ? ? ? ?Past Medical History:  ?Diagnosis Date  ? Anxiety   ? 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 BPH (benign prostatic hyperplasia)   ? CAD (coronary artery disease)   ? Cholesteatoma of attic   ? Depression   ? Diabetes mellitus ([place])   ? Ear disease   ? GERD (gastroesophageal reflux disease)   ? Hyperlipidemia   ? Hypertension   ? PVD (peripheral vascular disease) ([place])   ? Stroke [facility])   ? ?Past Surgical History:  ?Procedure Laterality Date  ? BONE ANCHORED HEARING AID IMPLANT Left 06/25/2013  ? Procedure: BONE ANCHORED HEARING AID (BAHA) IMPLANT;  Surgeon: Fannie Knee, MD;  Location: [place];  Service: ENT;  Laterality: Left;  ? CAROTID STENT INSERTION Left   ? COCHLEAR IMPLANT    ? CORONARY STENT PLACEMENT    ? MASS EXCISION Left 06/25/2013  ? Procedure: EXCISION LEFT TEMPORAL MASS;  Surgeon: Fannie Knee, MD;  Location: [place];  Service: ENT;  Laterality: Left;  ? TONSILLECTOMY    ? ? ?Allergies  ?Allergen Reactions  ? Brilinta [Ticagrelor] Shortness Of Breath  ? Penicillins Hives  ? Ezetimibe-Simvastatin Other (See Comments)  ?  Myalgia ?   ? ? ?Social History  ? ?Socioeconomic History  ? Marital status: Widowed  ?  Spouse name: Not on file  ? Number of children: 3  ? Years of education: Not on file  ? Highest education level: Not on file  ?Occupational History  ? Not on file  ?Tobacco Use  ? Smoking status: Former  ?  Types: Cigarettes  ?  Quit date: 05/19/1989  ?  Years since quitting: 32.7  ? Smokeless tobacco: Never  ?Substance and Sexual Activity  ? Alcohol use: Not Currently  ? Drug use: No  ? Sexual activity: Not on file  ?Other Topics Concern  ? Not on file  ?Social History Narrative  ? Not on file  ? ?Social Determinants of Health  ? ?Financial Resource Strain: Not on file  ?Food Insecurity: Not on file  ?Transportation Needs: Not on file  ?Physical Activity: Not on file  ?Stress: Not on file  ?Social Connections: Not on file  ?Intimate Partner Violence: Not on file  ? ? ?Family History  ?Problem Relation Age of Onset  ? Diabetes Other   ? Diabetes Mother   ? Diabetes Maternal Aunt   ? Depression Other   ? Depression Other   ? Drug abuse Daughter   ? Diabetes Daughter   ? ? ? ? ? ?Review of Systems ?Constitutional: negative for anorexia, fevers and sweats  ?Eyes: negative for irritation, redness and visual disturbance  ?Ears, nose, mouth, throat, and face: negative for earaches, epistaxis, nasal congestion and sore throat  ?Respiratory: negative for cough,sputum and wheezing  ?Cardiovascular: negative for chest pain lower extremity edema, orthopnea, palpitations and syncope  ?Gastrointestinal: negative for abdominal pain, constipation, diarrhea, melena, nausea and vomiting  ?Genitourinary:negative for dysuria, frequency and hematuria  ?Hematologic/lymphatic: negative for bleeding, easy bruising and lymphadenopathy  ?Musculoskeletal:negative for arthralgias, muscle weakness and stiff joints  ?Neurological: negative for coordination problems, gait problems, headaches and weakness  ?Endocrine: negative for diabetic symptoms including polydipsia,  polyuria and weight loss ? ?   ?Objective:  ? Physical Exam ? ?Gen. Pleasant, well-nourished, elderly, in no distress, normal affect ?ENT - no pallor,icterus, no post nasal drip ?Neck: No JVD, no thyromegaly, no carotid bruits ?Lungs: no use of accessory muscles, no dullness to percussion, left basal rales no rhonchi  ?Cardiovascular: Rhythm regular, heart sounds  normal, no murmurs or gallops, no peripheral edema ?Abdomen: soft and non-tender, no hepatosplenomegaly, BS normal. ?Musculoskeletal: No deformities, no cyanosis or clubbing ?Neuro:  alert, non focal ? ? ? ?   ?Assessment & Plan:  ? ? ?

## 2022-02-20 NOTE — Assessment & Plan Note (Signed)
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 ?We will obtain PFTs to assess degree of airway obstruction. ?Based on this we will decide if he needs long-term bronchodilators.  Meanwhile he will continue Dulera and albuterol to use as needed ?

## 2022-02-20 NOTE — Assessment & Plan Note (Signed)
Was transudative on review of pleural fluid results after thoracentesis. ?Pneumonia appear to be improving on CT scan, he will need follow-up imaging on his next visit ?No evidence of ILD ?

## 2022-02-20 NOTE — Assessment & Plan Note (Addendum)
He did require 1 L oxygen blended into his CPAP machine after his sleep study in 2021. ?He was set up with home oxygen after admission for pneumonia in 12/2021. ?Today his saturation stayed at 95% on ambulating 1.5 laps around the office, he was limited by pain in his legs, unfortunately would not qualify for POC at this time but we can reassess in the future ?

## 2022-03-05 ENCOUNTER — Telehealth: Payer: Self-pay | Admitting: Pulmonary Disease

## 2022-03-05 ENCOUNTER — Encounter: Payer: Self-pay | Admitting: Cardiology

## 2022-03-05 NOTE — Telephone Encounter (Signed)
Rigoberto Noel, MD ?to Sharlot Gowda, CMA  [facility] Triage Pool   ?  12:18 PM ? We can try to provide him with a office visit at [facility] office with APP  ?Alternatively he can try to call cardiology for office visit to assess  ?We will need to assess clinically and by his weight to see if more diuresis required  ?Previously his breathing problem was related to fluid buildup due to heart issues  ? ?I called and spoke with the pt and notified of response from Dr Elsworth Soho  ?He verbalized understanding ?He prefers making cards appt  ?Will call back as needed ?

## 2022-03-05 NOTE — Telephone Encounter (Signed)
Primary Pulmonologist: Dr. Elsworth Soho  ?Last office visit and with whom: 02/20/2022 ?What do we see them for (pulmonary problems): resp. Failure, OSA, pleural effusion, COPD ?Last OV assessment/plan: see below  ? ?Was appointment offered to patient (explain)?  None available in [facility] or [place] office with Dr.Alva ? ? ?Reason for call:  ?Shortness of breath has worsened over the last week. ?States he saw PCP and had CXR and saw fluid on right lung. Increased lasix for 3 days and told patient if that didn't help to contact Dr. Elsworth Soho.  ?Had a heart attack last month and SOB has been increased since but is worse this week  ?Using oxygen normally uses 3LO2 but has increased 4LO2 cont. To maintain sats about 90%  ? ?Allergies  ?Allergen Reactions  ? Brilinta [Ticagrelor] Shortness Of Breath  ? Penicillins Hives  ? Ezetimibe-Simvastatin Other (See Comments)  ?  Myalgia ?  ? ? ?Immunization History  ?Administered Date(s) Administered  ? Fluad Quad(high Dose 65+) 07/26/2016, 09/20/2020  ? Influenza Split 08/14/2010, 07/25/2011  ? Influenza, High Dose Seasonal PF 08/22/2015, 08/10/2019  ? Influenza, Seasonal, Injecte, Preservative Fre 07/13/2013, 07/20/2014  ? Influenza,inj,quad, With Preservative 08/29/2017  ? Influenza-Unspecified 08/14/2012  ? PFIZER(Purple Top)SARS-COV-2 Vaccination 12/18/2019, 01/11/2020, 09/20/2020  ? Pension scheme manager 6yrs & up 02/05/2022  ? Pneumococcal Polysaccharide-23 09/08/2004  ?  Assessment & Plan Note by Rigoberto Noel, MD at 02/20/2022 10:39 AM ? ?Author: Rigoberto Noel, MD Author Type: Physician Filed: 02/20/2022 10:46 AM  ?Note Status: [place] List: Cosign Not Required Encounter Date: 02/20/2022  ?Problem: Chronic respiratory failure with hypoxia (HCC)  ?Editor: Rigoberto Noel, MD (Physician)      ?Prior Versions: 1. Rigoberto Noel, MD (Physician) at 02/20/2022 10:40 AM - Written  ?He did require 1 L oxygen blended into his CPAP machine after his sleep study in 2021. ?He was set up with  home oxygen after admission for pneumonia in 12/2021. ?Today his saturation stayed at 95% on ambulating 1.5 laps around the office, he was limited by pain in his legs, unfortunately would not qualify for POC at this time but we can reassess in the future ?  ?  ? ? ? Assessment & Plan Note by Rigoberto Noel, MD at 02/20/2022 10:43 AM ? ?Author: Rigoberto Noel, MD Author Type: Physician Filed: 02/20/2022 10:43 AM  ?Note Status: Written Cosign: Cosign Not Required Encounter Date: 02/20/2022  ?Problem: Pleural effusion, right  ?Editor: Rigoberto Noel, MD (Physician)      ?       ?Was transudative on review of pleural fluid results after thoracentesis. ?Pneumonia appear to be improving on CT scan, he will need follow-up imaging on his next visit ?No evidence of ILD ?  ?  ? ? ? Assessment & Plan Note by Rigoberto Noel, MD at 02/20/2022 10:41 AM ? ?Author: Rigoberto Noel, MD Author Type: Physician Filed: 02/20/2022 10:42 AM  ?Note Status: Written Cosign: Cosign Not Required Encounter Date: 02/20/2022  ?Problem: Obstructive sleep apnea  ?Editor: Rigoberto Noel, MD (Physician)      ?       ?Severe OSA was noted on baseline study from 2021 which were reviewed at [facility]. ?He tolerated BiPAP better during the hospital but did not tolerate high pressure of 14 cm that his CPAP is set at. ?We will change him to auto settings 5 to 14 cm and also add an EPR setting of 3 hopefully he will tolerate  this better. ?We will ask DME to blend oxygen into his CPAP machine ?  ?If he is unable to tolerate CPAP in spite of these changes, then we can consider using a BiPAP for him ?Weight loss encouraged, compliance with goal of at least 4-6 hrs every night is the expectation. ?Advised against medications with sedative side effects ?Cautioned against driving when sleepy - understanding that sleepiness will vary on a day to day basis ?  ?  ?  ? ? ? Assessment & Plan Note by Rigoberto Noel, MD at 02/20/2022 10:40 AM ? ?Author: Rigoberto Noel, MD Author  Type: Physician Filed: 02/20/2022 10:41 AM  ?Note Status: Written Cosign: Cosign Not Required Encounter Date: 02/20/2022  ?Problem: COPD exacerbation ([place])  ?Editor: Rigoberto Noel, MD (Physician)      ?       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 ?We will obtain PFTs to assess degree of airway obstruction. ?Based on this we will decide if he needs long-term bronchodilators.  Meanwhile he will continue Dulera and albuterol to use as needed ?  ?  ? ? ?

## 2022-03-06 ENCOUNTER — Telehealth: Payer: Self-pay

## 2022-03-06 ENCOUNTER — Encounter (HOSPITAL_COMMUNITY): Payer: Self-pay

## 2022-03-06 ENCOUNTER — Other Ambulatory Visit: Payer: Self-pay

## 2022-03-06 ENCOUNTER — Inpatient Hospital Stay (HOSPITAL_COMMUNITY)
Admission: EM | Admit: 2022-03-06 | Discharge: 2022-03-13 | DRG: 189 | Disposition: A | Discharge status: Home Health | Payer: Medicare Other | Attending: Internal Medicine | Admitting: Internal Medicine

## 2022-03-06 ENCOUNTER — Institutional Professional Consult (permissible substitution): Payer: Medicare Other | Admitting: Internal Medicine

## 2022-03-06 ENCOUNTER — Emergency Department (HOSPITAL_COMMUNITY): Payer: Medicare Other

## 2022-03-06 DIAGNOSIS — J441 Chronic obstructive pulmonary disease with (acute) exacerbation: Secondary | ICD-10-CM | POA: Diagnosis present

## 2022-03-06 DIAGNOSIS — I25810 Atherosclerosis of coronary artery bypass graft(s) without angina pectoris: Secondary | ICD-10-CM | POA: Diagnosis present

## 2022-03-06 DIAGNOSIS — Z87891 Personal history of nicotine dependence: Secondary | ICD-10-CM

## 2022-03-06 DIAGNOSIS — Z515 Encounter for palliative care: Secondary | ICD-10-CM | POA: Diagnosis not present

## 2022-03-06 DIAGNOSIS — D631 Anemia in chronic kidney disease: Secondary | ICD-10-CM | POA: Diagnosis present

## 2022-03-06 DIAGNOSIS — I25700 Atherosclerosis of coronary artery bypass graft(s), unspecified, with unstable angina pectoris: Secondary | ICD-10-CM

## 2022-03-06 DIAGNOSIS — N401 Enlarged prostate with lower urinary tract symptoms: Secondary | ICD-10-CM | POA: Diagnosis present

## 2022-03-06 DIAGNOSIS — Z7902 Long term (current) use of antithrombotics/antiplatelets: Secondary | ICD-10-CM

## 2022-03-06 DIAGNOSIS — I5021 Acute systolic (congestive) heart failure: Secondary | ICD-10-CM | POA: Diagnosis not present

## 2022-03-06 DIAGNOSIS — J449 Chronic obstructive pulmonary disease, unspecified: Secondary | ICD-10-CM | POA: Diagnosis present

## 2022-03-06 DIAGNOSIS — I130 Hypertensive heart and chronic kidney disease with heart failure and stage 1 through stage 4 chronic kidney disease, or unspecified chronic kidney disease: Secondary | ICD-10-CM | POA: Diagnosis present

## 2022-03-06 DIAGNOSIS — I739 Peripheral vascular disease, unspecified: Secondary | ICD-10-CM | POA: Diagnosis present

## 2022-03-06 DIAGNOSIS — I5023 Acute on chronic systolic (congestive) heart failure: Secondary | ICD-10-CM | POA: Diagnosis not present

## 2022-03-06 DIAGNOSIS — N1832 Chronic kidney disease, stage 3b: Secondary | ICD-10-CM | POA: Diagnosis not present

## 2022-03-06 DIAGNOSIS — N189 Chronic kidney disease, unspecified: Secondary | ICD-10-CM

## 2022-03-06 DIAGNOSIS — K219 Gastro-esophageal reflux disease without esophagitis: Secondary | ICD-10-CM | POA: Diagnosis present

## 2022-03-06 DIAGNOSIS — N183 Chronic kidney disease, stage 3 unspecified: Secondary | ICD-10-CM | POA: Diagnosis present

## 2022-03-06 DIAGNOSIS — F419 Anxiety disorder, unspecified: Secondary | ICD-10-CM | POA: Diagnosis present

## 2022-03-06 DIAGNOSIS — E782 Mixed hyperlipidemia: Secondary | ICD-10-CM | POA: Diagnosis present

## 2022-03-06 DIAGNOSIS — Z79899 Other long term (current) drug therapy: Secondary | ICD-10-CM

## 2022-03-06 DIAGNOSIS — I5043 Acute on chronic combined systolic (congestive) and diastolic (congestive) heart failure: Secondary | ICD-10-CM | POA: Diagnosis not present

## 2022-03-06 DIAGNOSIS — F32A Depression, unspecified: Secondary | ICD-10-CM | POA: Diagnosis present

## 2022-03-06 DIAGNOSIS — Z888 Allergy status to other drugs, medicaments and biological substances status: Secondary | ICD-10-CM

## 2022-03-06 DIAGNOSIS — J9811 Atelectasis: Secondary | ICD-10-CM | POA: Diagnosis present

## 2022-03-06 DIAGNOSIS — I493 Ventricular premature depolarization: Secondary | ICD-10-CM | POA: Diagnosis present

## 2022-03-06 DIAGNOSIS — J9621 Acute and chronic respiratory failure with hypoxia: Secondary | ICD-10-CM | POA: Diagnosis not present

## 2022-03-06 DIAGNOSIS — N184 Chronic kidney disease, stage 4 (severe): Secondary | ICD-10-CM | POA: Diagnosis not present

## 2022-03-06 DIAGNOSIS — E1165 Type 2 diabetes mellitus with hyperglycemia: Secondary | ICD-10-CM | POA: Diagnosis present

## 2022-03-06 DIAGNOSIS — R338 Other retention of urine: Secondary | ICD-10-CM | POA: Diagnosis present

## 2022-03-06 DIAGNOSIS — N400 Enlarged prostate without lower urinary tract symptoms: Secondary | ICD-10-CM | POA: Diagnosis present

## 2022-03-06 DIAGNOSIS — I5020 Unspecified systolic (congestive) heart failure: Secondary | ICD-10-CM | POA: Diagnosis not present

## 2022-03-06 DIAGNOSIS — R0902 Hypoxemia: Secondary | ICD-10-CM

## 2022-03-06 DIAGNOSIS — N179 Acute kidney failure, unspecified: Secondary | ICD-10-CM | POA: Diagnosis present

## 2022-03-06 DIAGNOSIS — Z7901 Long term (current) use of anticoagulants: Secondary | ICD-10-CM | POA: Diagnosis not present

## 2022-03-06 DIAGNOSIS — Z8673 Personal history of transient ischemic attack (TIA), and cerebral infarction without residual deficits: Secondary | ICD-10-CM

## 2022-03-06 DIAGNOSIS — Z955 Presence of coronary angioplasty implant and graft: Secondary | ICD-10-CM

## 2022-03-06 DIAGNOSIS — Z66 Do not resuscitate: Secondary | ICD-10-CM | POA: Diagnosis present

## 2022-03-06 DIAGNOSIS — J9611 Chronic respiratory failure with hypoxia: Secondary | ICD-10-CM | POA: Diagnosis present

## 2022-03-06 DIAGNOSIS — Z951 Presence of aortocoronary bypass graft: Secondary | ICD-10-CM

## 2022-03-06 DIAGNOSIS — Z880 Allergy status to penicillin: Secondary | ICD-10-CM

## 2022-03-06 DIAGNOSIS — Z9621 Cochlear implant status: Secondary | ICD-10-CM | POA: Diagnosis present

## 2022-03-06 DIAGNOSIS — I480 Paroxysmal atrial fibrillation: Secondary | ICD-10-CM | POA: Diagnosis present

## 2022-03-06 DIAGNOSIS — Z7189 Other specified counseling: Secondary | ICD-10-CM | POA: Diagnosis not present

## 2022-03-06 DIAGNOSIS — I10 Essential (primary) hypertension: Secondary | ICD-10-CM | POA: Diagnosis not present

## 2022-03-06 DIAGNOSIS — E1151 Type 2 diabetes mellitus with diabetic peripheral angiopathy without gangrene: Secondary | ICD-10-CM | POA: Diagnosis present

## 2022-03-06 DIAGNOSIS — I429 Cardiomyopathy, unspecified: Secondary | ICD-10-CM | POA: Diagnosis present

## 2022-03-06 DIAGNOSIS — Z794 Long term (current) use of insulin: Secondary | ICD-10-CM | POA: Diagnosis not present

## 2022-03-06 DIAGNOSIS — I2510 Atherosclerotic heart disease of native coronary artery without angina pectoris: Secondary | ICD-10-CM | POA: Diagnosis present

## 2022-03-06 DIAGNOSIS — Z833 Family history of diabetes mellitus: Secondary | ICD-10-CM

## 2022-03-06 DIAGNOSIS — E1122 Type 2 diabetes mellitus with diabetic chronic kidney disease: Secondary | ICD-10-CM | POA: Diagnosis present

## 2022-03-06 DIAGNOSIS — Z818 Family history of other mental and behavioral disorders: Secondary | ICD-10-CM

## 2022-03-06 DIAGNOSIS — G4733 Obstructive sleep apnea (adult) (pediatric): Secondary | ICD-10-CM | POA: Diagnosis present

## 2022-03-06 DIAGNOSIS — I252 Old myocardial infarction: Secondary | ICD-10-CM

## 2022-03-06 DIAGNOSIS — E1169 Type 2 diabetes mellitus with other specified complication: Secondary | ICD-10-CM | POA: Diagnosis present

## 2022-03-06 HISTORY — DX: Heart failure, unspecified: I50.9

## 2022-03-06 LAB — CBC WITH DIFFERENTIAL/PLATELET
Abs Immature Granulocytes: 0.07 10*3/uL (ref 0.00–0.07)
Basophils Absolute: 0.1 10*3/uL (ref 0.0–0.1)
Basophils Relative: 1 %
Eosinophils Absolute: 0 10*3/uL (ref 0.0–0.5)
Eosinophils Relative: 0 %
HCT: 31 % — ABNORMAL LOW (ref 39.0–52.0)
Hemoglobin: 9.5 g/dL — ABNORMAL LOW (ref 13.0–17.0)
Immature Granulocytes: 1 %
Lymphocytes Relative: 7 %
Lymphs Abs: 0.8 10*3/uL (ref 0.7–4.0)
MCH: 28.9 pg (ref 26.0–34.0)
MCHC: 30.6 g/dL (ref 30.0–36.0)
MCV: 94.2 fL (ref 80.0–100.0)
Monocytes Absolute: 0.6 10*3/uL (ref 0.1–1.0)
Monocytes Relative: 5 %
Neutro Abs: 9.9 10*3/uL — ABNORMAL HIGH (ref 1.7–7.7)
Neutrophils Relative %: 86 %
Platelets: 182 10*3/uL (ref 150–400)
RBC: 3.29 MIL/uL — ABNORMAL LOW (ref 4.22–5.81)
RDW: 17.1 % — ABNORMAL HIGH (ref 11.5–15.5)
WBC: 11.4 10*3/uL — ABNORMAL HIGH (ref 4.0–10.5)
nRBC: 0 % (ref 0.0–0.2)

## 2022-03-06 LAB — COMPREHENSIVE METABOLIC PANEL
ALT: 11 U/L (ref 0–44)
AST: 12 U/L — ABNORMAL LOW (ref 15–41)
Albumin: 3.5 g/dL (ref 3.5–5.0)
Alkaline Phosphatase: 61 U/L (ref 38–126)
Anion gap: 8 (ref 5–15)
BUN: 45 mg/dL — ABNORMAL HIGH (ref 8–23)
CO2: 31 mmol/L (ref 22–32)
Calcium: 8.9 mg/dL (ref 8.9–10.3)
Chloride: 100 mmol/L (ref 98–111)
Creatinine, Ser: 2.13 mg/dL — ABNORMAL HIGH (ref 0.61–1.24)
GFR, Estimated: 30 mL/min — ABNORMAL LOW (ref >60)
Glucose, Bld: 208 mg/dL — ABNORMAL HIGH (ref 70–99)
Potassium: 4.1 mmol/L (ref 3.5–5.1)
Sodium: 139 mmol/L (ref 135–145)
Total Bilirubin: 0.9 mg/dL (ref 0.3–1.2)
Total Protein: 6.8 g/dL (ref 6.5–8.1)

## 2022-03-06 LAB — GLUCOSE, CAPILLARY
Glucose-Capillary: 154 mg/dL — ABNORMAL HIGH (ref 70–99)
Glucose-Capillary: 289 mg/dL — ABNORMAL HIGH (ref 70–99)

## 2022-03-06 LAB — MRSA NEXT GEN BY PCR, NASAL: MRSA by PCR Next Gen: NOT DETECTED

## 2022-03-06 LAB — BRAIN NATRIURETIC PEPTIDE: B Natriuretic Peptide: 1020 pg/mL — ABNORMAL HIGH (ref 0.0–100.0)

## 2022-03-06 LAB — MAGNESIUM
Magnesium: 1.9 mg/dL (ref 1.7–2.4)
Magnesium: 2.1 mg/dL (ref 1.7–2.4)

## 2022-03-06 LAB — PHOSPHORUS: Phosphorus: 3.8 mg/dL (ref 2.5–4.6)

## 2022-03-06 MED ORDER — ACETAMINOPHEN 650 MG RE SUPP: 650.0000 mg | Freq: Four times a day (QID) | RECTAL | Status: DC | PRN

## 2022-03-06 MED ORDER — ALPRAZOLAM 0.25 MG PO TABS
0.2500 mg | ORAL_TABLET | Freq: Two times a day (BID) | ORAL | Status: DC | PRN
  Administered 2022-03-08 – 2022-03-12 (×4): 0.25 mg via ORAL
  Filled 2022-03-06 (×4): qty 1

## 2022-03-06 MED ORDER — ALBUTEROL SULFATE (2.5 MG/3ML) 0.083% IN NEBU: 2.5000 mg | INHALATION_SOLUTION | RESPIRATORY_TRACT | Status: DC | PRN

## 2022-03-06 MED ORDER — ADULT MULTIVITAMIN W/MINERALS CH
1.0000 | ORAL_TABLET | Freq: Every day | ORAL | Status: DC
  Administered 2022-03-07 – 2022-03-13 (×7): 1 via ORAL
  Filled 2022-03-06 (×8): qty 1

## 2022-03-06 MED ORDER — TAMSULOSIN HCL 0.4 MG PO CAPS
0.4000 mg | ORAL_CAPSULE | Freq: Every day | ORAL | Status: DC
  Administered 2022-03-06 – 2022-03-13 (×8): 0.4 mg via ORAL
  Filled 2022-03-06 (×8): qty 1

## 2022-03-06 MED ORDER — CLOPIDOGREL BISULFATE 75 MG PO TABS
75.0000 mg | ORAL_TABLET | Freq: Every day | ORAL | Status: DC
  Administered 2022-03-06 – 2022-03-13 (×8): 75 mg via ORAL
  Filled 2022-03-06 (×8): qty 1

## 2022-03-06 MED ORDER — HYDROCODONE-ACETAMINOPHEN 5-325 MG PO TABS
1.0000 | ORAL_TABLET | Freq: Three times a day (TID) | ORAL | Status: DC | PRN
  Administered 2022-03-07 – 2022-03-13 (×5): 1 via ORAL
  Filled 2022-03-06 (×5): qty 1

## 2022-03-06 MED ORDER — INSULIN DETEMIR 100 UNIT/ML ~~LOC~~ SOLN
9.0000 [IU] | Freq: Two times a day (BID) | SUBCUTANEOUS | Status: DC
  Administered 2022-03-06 – 2022-03-07 (×2): 9 [IU] via SUBCUTANEOUS
  Filled 2022-03-06 (×4): qty 0.09

## 2022-03-06 MED ORDER — SODIUM CHLORIDE 0.9% FLUSH: 3.0000 mL | INTRAVENOUS | Status: DC | PRN

## 2022-03-06 MED ORDER — ORAL CARE MOUTH RINSE
15.0000 mL | Freq: Two times a day (BID) | OROMUCOSAL | Status: DC
  Administered 2022-03-07 – 2022-03-12 (×12): 15 mL via OROMUCOSAL

## 2022-03-06 MED ORDER — PANTOPRAZOLE SODIUM 40 MG PO TBEC
40.0000 mg | DELAYED_RELEASE_TABLET | Freq: Every day | ORAL | Status: DC
  Administered 2022-03-07 – 2022-03-13 (×7): 40 mg via ORAL
  Filled 2022-03-06 (×7): qty 1

## 2022-03-06 MED ORDER — ONDANSETRON HCL 4 MG PO TABS: 4.0000 mg | ORAL_TABLET | Freq: Four times a day (QID) | ORAL | Status: DC | PRN

## 2022-03-06 MED ORDER — SODIUM CHLORIDE 0.9% FLUSH
3.0000 mL | Freq: Two times a day (BID) | INTRAVENOUS | Status: DC
  Administered 2022-03-06 – 2022-03-13 (×14): 3 mL via INTRAVENOUS

## 2022-03-06 MED ORDER — POTASSIUM CHLORIDE 20 MEQ PO PACK
20.0000 meq | PACK | Freq: Once | ORAL | Status: AC
  Administered 2022-03-06: 20 meq via ORAL
  Filled 2022-03-06: qty 1

## 2022-03-06 MED ORDER — METHYLPREDNISOLONE SODIUM SUCC 40 MG IJ SOLR
40.0000 mg | Freq: Two times a day (BID) | INTRAMUSCULAR | Status: DC
  Administered 2022-03-07: 40 mg via INTRAVENOUS
  Filled 2022-03-06: qty 1

## 2022-03-06 MED ORDER — ISOSORBIDE DINITRATE 20 MG PO TABS
20.0000 mg | ORAL_TABLET | Freq: Two times a day (BID) | ORAL | Status: DC
  Administered 2022-03-07 – 2022-03-13 (×13): 20 mg via ORAL
  Filled 2022-03-06 (×15): qty 1

## 2022-03-06 MED ORDER — MAGNESIUM SULFATE IN D5W 1-5 GM/100ML-% IV SOLN
1.0000 g | Freq: Once | INTRAVENOUS | Status: AC
  Administered 2022-03-06: 1 g via INTRAVENOUS
  Filled 2022-03-06: qty 100

## 2022-03-06 MED ORDER — ACETAMINOPHEN 325 MG PO TABS
650.0000 mg | ORAL_TABLET | Freq: Four times a day (QID) | ORAL | Status: DC | PRN
  Administered 2022-03-10: 650 mg via ORAL
  Filled 2022-03-06: qty 2

## 2022-03-06 MED ORDER — METOPROLOL SUCCINATE ER 50 MG PO TB24
50.0000 mg | ORAL_TABLET | Freq: Every day | ORAL | Status: DC
  Administered 2022-03-06 – 2022-03-13 (×8): 50 mg via ORAL
  Filled 2022-03-06 (×8): qty 1

## 2022-03-06 MED ORDER — GABAPENTIN 300 MG PO CAPS
300.0000 mg | ORAL_CAPSULE | Freq: Every day | ORAL | Status: DC
  Administered 2022-03-06 – 2022-03-12 (×7): 300 mg via ORAL
  Filled 2022-03-06 (×8): qty 1

## 2022-03-06 MED ORDER — ROSUVASTATIN CALCIUM 10 MG PO TABS
10.0000 mg | ORAL_TABLET | Freq: Every evening | ORAL | Status: DC
  Administered 2022-03-06 – 2022-03-12 (×7): 10 mg via ORAL
  Filled 2022-03-06 (×7): qty 1

## 2022-03-06 MED ORDER — ONDANSETRON HCL 4 MG/2ML IJ SOLN: 4.0000 mg | Freq: Four times a day (QID) | INTRAMUSCULAR | Status: DC | PRN

## 2022-03-06 MED ORDER — SERTRALINE HCL 50 MG PO TABS
25.0000 mg | ORAL_TABLET | Freq: Every evening | ORAL | Status: DC
  Administered 2022-03-06 – 2022-03-12 (×7): 25 mg via ORAL
  Filled 2022-03-06 (×7): qty 1

## 2022-03-06 MED ORDER — METHYLPREDNISOLONE SODIUM SUCC 125 MG IJ SOLR
125.0000 mg | Freq: Once | INTRAMUSCULAR | Status: AC
  Administered 2022-03-06: 125 mg via INTRAVENOUS
  Filled 2022-03-06: qty 2

## 2022-03-06 MED ORDER — FUROSEMIDE 10 MG/ML IJ SOLN
80.0000 mg | Freq: Once | INTRAMUSCULAR | Status: AC
  Administered 2022-03-06: 80 mg via INTRAVENOUS
  Filled 2022-03-06: qty 8

## 2022-03-06 MED ORDER — CHLORHEXIDINE GLUCONATE 0.12 % MT SOLN
15.0000 mL | Freq: Two times a day (BID) | OROMUCOSAL | Status: DC
  Administered 2022-03-06 – 2022-03-13 (×14): 15 mL via OROMUCOSAL
  Filled 2022-03-06 (×14): qty 15

## 2022-03-06 MED ORDER — IPRATROPIUM-ALBUTEROL 0.5-2.5 (3) MG/3ML IN SOLN
3.0000 mL | Freq: Once | RESPIRATORY_TRACT | Status: AC
  Administered 2022-03-06: 3 mL via RESPIRATORY_TRACT
  Filled 2022-03-06: qty 3

## 2022-03-06 MED ORDER — ARFORMOTEROL TARTRATE 15 MCG/2ML IN NEBU
15.0000 ug | INHALATION_SOLUTION | Freq: Two times a day (BID) | RESPIRATORY_TRACT | Status: DC
  Administered 2022-03-06 – 2022-03-13 (×14): 15 ug via RESPIRATORY_TRACT
  Filled 2022-03-06 (×14): qty 2

## 2022-03-06 MED ORDER — FUROSEMIDE 10 MG/ML IJ SOLN
60.0000 mg | Freq: Two times a day (BID) | INTRAMUSCULAR | Status: DC
  Administered 2022-03-06 – 2022-03-07 (×2): 60 mg via INTRAVENOUS
  Filled 2022-03-06 (×2): qty 6

## 2022-03-06 MED ORDER — BUDESONIDE 0.5 MG/2ML IN SUSP
0.5000 mg | Freq: Two times a day (BID) | RESPIRATORY_TRACT | Status: DC
  Administered 2022-03-06 – 2022-03-13 (×14): 0.5 mg via RESPIRATORY_TRACT
  Filled 2022-03-06 (×14): qty 2

## 2022-03-06 MED ORDER — MORPHINE SULFATE (PF) 4 MG/ML IV SOLN
4.0000 mg | Freq: Once | INTRAVENOUS | Status: AC
  Administered 2022-03-06: 4 mg via INTRAVENOUS
  Filled 2022-03-06: qty 1

## 2022-03-06 MED ORDER — INSULIN ASPART 100 UNIT/ML IJ SOLN
0.0000 [IU] | Freq: Three times a day (TID) | INTRAMUSCULAR | Status: DC
  Administered 2022-03-06: 3 [IU] via SUBCUTANEOUS
  Administered 2022-03-07: 5 [IU] via SUBCUTANEOUS
  Administered 2022-03-07 (×2): 8 [IU] via SUBCUTANEOUS
  Administered 2022-03-08 (×2): 11 [IU] via SUBCUTANEOUS
  Administered 2022-03-09: 5 [IU] via SUBCUTANEOUS
  Administered 2022-03-09: 8 [IU] via SUBCUTANEOUS
  Administered 2022-03-09: 15 [IU] via SUBCUTANEOUS
  Administered 2022-03-10: 6 [IU] via SUBCUTANEOUS
  Administered 2022-03-10: 8 [IU] via SUBCUTANEOUS
  Administered 2022-03-10: 5 [IU] via SUBCUTANEOUS
  Administered 2022-03-11: 8 [IU] via SUBCUTANEOUS
  Administered 2022-03-11: 3 [IU] via SUBCUTANEOUS
  Administered 2022-03-11: 11 [IU] via SUBCUTANEOUS
  Administered 2022-03-12: 2 [IU] via SUBCUTANEOUS
  Administered 2022-03-12: 8 [IU] via SUBCUTANEOUS
  Administered 2022-03-12: 3 [IU] via SUBCUTANEOUS
  Administered 2022-03-13: 8 [IU] via SUBCUTANEOUS
  Administered 2022-03-13: 2 [IU] via SUBCUTANEOUS

## 2022-03-06 MED ORDER — ALLOPURINOL 300 MG PO TABS
300.0000 mg | ORAL_TABLET | Freq: Every day | ORAL | Status: DC
  Administered 2022-03-06 – 2022-03-13 (×8): 300 mg via ORAL
  Filled 2022-03-06 (×8): qty 1

## 2022-03-06 MED ORDER — CERTAVITE/ANTIOXIDANTS PO TABS
1.0000 | ORAL_TABLET | Freq: Every day | ORAL | Status: DC
Start: 2022-03-06 — End: 2022-03-06

## 2022-03-06 MED ORDER — PREDNISONE 20 MG PO TABS: 40.0000 mg | ORAL_TABLET | Freq: Every day | ORAL | Status: DC

## 2022-03-06 MED ORDER — SODIUM CHLORIDE 0.9 % IV SOLN: 250.0000 mL | INTRAVENOUS | Status: DC | PRN

## 2022-03-06 MED ORDER — APIXABAN 2.5 MG PO TABS
2.5000 mg | ORAL_TABLET | Freq: Two times a day (BID) | ORAL | Status: DC
  Administered 2022-03-06 – 2022-03-13 (×14): 2.5 mg via ORAL
  Filled 2022-03-06 (×14): qty 1

## 2022-03-06 NOTE — ED Notes (Signed)
John Foster, MD notified re: pts request for BIPAP and labored breathing, RT notified as well ?

## 2022-03-06 NOTE — ED Provider Notes (Signed)
?[facility] ?Provider Note ? ? ?CSN: [id number] ?Arrival date & time: 03/06/22  1150 ? ?  ? ?History ? ?Chief Complaint  ?Patient presents with  ? Shortness of Breath  ? ? ?John [name] is a 81 y.o. male. ? ?HPI ?Patient presenting for evaluation of shortness of breath despite taking usual medicines.  Onset about a week ago, and no improvement with taking the increased dose of Lasix for 3 days, which she completed 2 days ago.  He did not take any medications this morning.  He uses oxygen at 4 L at home.  He uses it 24/7.  He states that when he uses it his oxygen saturations are usually in the range of 94%.  He denies chest pain. ? ?Home Medications ?Prior to Admission medications   ?Medication Sig Start Date End Date Taking? Authorizing Provider  ?albuterol (VENTOLIN HFA) 108 (90 Base) MCG/ACT inhaler Inhale 2 puffs into the lungs every 6 (six) hours as needed for wheezing or shortness of breath. 12/20/20   Pokhrel, Corrie Mckusick, MD  ?allopurinol (ZYLOPRIM) 300 MG tablet Take 300 mg by mouth daily. 06/21/12   [provider]  ?ALPRAZolam Duanne Moron) 0.25 MG tablet Take 1 tablet (0.25 mg total) by mouth 2 (two) times daily as needed for anxiety. 02/05/22   Kathie Dike, MD  ?apixaban (ELIQUIS) 2.5 MG TABS tablet Take 1 tablet (2.5 mg total) by mouth 2 (two) times daily. 12/29/21   Little Ishikawa, MD  ?cholecalciferol (VITAMIN D3) 25 MCG (1000 UNIT) tablet Take 1,000 Units by mouth daily.    [provider]  ?clopidogrel (PLAVIX) 75 MG tablet Take 1 tablet (75 mg total) by mouth daily. 12/29/21   Little Ishikawa, MD  ?Cyanocobalamin (VITAMIN B 12 PO) Take 1 tablet by mouth daily.    [provider]  ?folic acid (FOLVITE) 1 MG tablet Take 1 tablet (1 mg total) by mouth daily. 12/29/21   Little Ishikawa, MD  ?gabapentin (NEURONTIN) 300 MG capsule Take 1 capsule (300 mg total) by mouth at bedtime. 12/29/21   Little Ishikawa, MD  ?HYDROcodone-acetaminophen  (NORCO/VICODIN) 5-325 MG tablet Take 1 tablet by mouth every 8 (eight) hours as needed for moderate pain. 02/05/22 02/05/23  Kathie Dike, MD  ?insulin aspart (NOVOLOG) 100 UNIT/ML injection Inject 7 Units into the skin 3 (three) times daily with meals. 02/05/22   Kathie Dike, MD  ?insulin glargine (LANTUS SOLOSTAR) 100 UNIT/ML Solostar Pen Inject 35 Units into the skin daily. Dose per sliding scale. 02/05/22   Kathie Dike, MD  ?Insulin Pen Needle 32G X 4 MM MISC Use to inject insulin up to 4 times daily. 12/29/21   Little Ishikawa, MD  ?ipratropium-albuterol (DUONEB) 0.5-2.5 (3) MG/3ML SOLN Take 3 mLs by nebulization 3 (three) times daily. 02/05/22   Kathie Dike, MD  ?isosorbide dinitrate (ISORDIL) 20 MG tablet Take 1 tablet (20 mg total) by mouth 2 (two) times daily. 12/29/21   Little Ishikawa, MD  ?metoprolol succinate (TOPROL-XL) 50 MG 24 hr tablet Take 1 tablet (50 mg total) by mouth daily. Take with or immediately following a meal. 02/06/22   Kathie Dike, MD  ?mometasone-formoterol (DULERA) 200-5 MCG/ACT AERO Inhale 2 puffs into the lungs in the morning and at bedtime. 02/05/22   Kathie Dike, MD  ?Multiple Vitamins-Minerals (CERTAVITE/ANTIOXIDANTS) TABS Take 1 tablet by mouth daily. 12/29/21   Little Ishikawa, MD  ?pantoprazole (PROTONIX) 40 MG tablet Take 40 mg by mouth daily with breakfast. 08/19/14  [provider]  ?rosuvastatin (CRESTOR) 10 MG tablet Take 1 tablet (10 mg total) by mouth every evening. 12/29/21   Little Ishikawa, MD  ?sertraline (ZOLOFT) 25 MG tablet Take 1 tablet (25 mg total) by mouth every evening. 01/09/21   Thayer Headings, PMHNP  ?tamsulosin (FLOMAX) 0.4 MG CAPS capsule Take 0.4 mg by mouth daily. 09/22/14   [provider]  ?thiamine 100 MG tablet Take 1 tablet (100 mg total) by mouth daily. 12/30/21   Little Ishikawa, MD  ?torsemide (DEMADEX) 20 MG tablet Take 2 tablets (40 mg total) by mouth daily. 02/05/22   Kathie Dike, MD   ?   ? ?Allergies    ?Brilinta [ticagrelor], Penicillins, and Ezetimibe-simvastatin   ? ?Review of Systems   ?Review of Systems ? ?Physical Exam ?Updated Vital Signs ?BP 117/86   Pulse 81   Temp (!) 97.5 ?F (36.4 ?C) (Oral)   Resp (!) 22   Ht 5\' 7"  (1.702 m)   Wt 83 kg   SpO2 96%   BMI 28.66 kg/m?  ?Physical Exam ?Vitals and nursing note reviewed.  ?Constitutional:   ?   Appearance: He is well-developed. He is not ill-appearing.  ?HENT:  ?   Head: Normocephalic and atraumatic.  ?   Right Ear: External ear normal.  ?   Left Ear: External ear normal.  ?   Nose: No congestion.  ?   Mouth/Throat:  ?   Mouth: Mucous membranes are moist.  ?Eyes:  ?   Conjunctiva/sclera: Conjunctivae normal.  ?   Pupils: Pupils are equal, round, and reactive to light.  ?Neck:  ?   Trachea: Phonation normal.  ?Cardiovascular:  ?   Rate and Rhythm: Normal rate and regular rhythm.  ?   Heart sounds: Normal heart sounds.  ?   Comments: No JVD. ?Pulmonary:  ?   Effort: Pulmonary effort is normal. No respiratory distress.  ?   Breath sounds: No stridor. Rhonchi present. No rales.  ?Abdominal:  ?   General: There is no distension.  ?   Palpations: Abdomen is soft.  ?   Tenderness: There is no abdominal tenderness.  ?Musculoskeletal:     ?   General: Normal range of motion.  ?   Cervical back: Normal range of motion and neck supple.  ?   Right lower leg: Edema present.  ?   Left lower leg: Edema present.  ?   Comments: 2+ lower extremity edema bilaterally  ?Skin: ?   General: Skin is warm and dry.  ?Neurological:  ?   Mental Status: He is alert and oriented to person, place, and time.  ?   Cranial Nerves: No cranial nerve deficit.  ?   Sensory: No sensory deficit.  ?   Motor: No abnormal muscle tone.  ?   Coordination: Coordination normal.  ?Psychiatric:     ?   Behavior: Behavior normal.     ?   Thought Content: Thought content normal.     ?   Judgment: Judgment normal.  ? ? ?ED Results / Procedures / Treatments   ?Labs ?(all labs ordered  are listed, but only abnormal results are displayed) ?Labs Reviewed  ?COMPREHENSIVE METABOLIC PANEL - Abnormal; Notable for the following components:  ?    Result Value  ? Glucose, Bld 208 (*)   ? BUN 45 (*)   ? Creatinine, Ser 2.13 (*)   ? AST 12 (*)   ? GFR, Estimated 30 (*)   ?  All other components within normal limits  ?CBC WITH DIFFERENTIAL/PLATELET - Abnormal; Notable for the following components:  ? WBC 11.4 (*)   ? RBC 3.29 (*)   ? Hemoglobin 9.5 (*)   ? HCT 31.0 (*)   ? RDW 17.1 (*)   ? Neutro Abs 9.9 (*)   ? All other components within normal limits  ?BRAIN NATRIURETIC PEPTIDE - Abnormal; Notable for the following components:  ? B Natriuretic Peptide 1,020.0 (*)   ? All other components within normal limits  ? ? ?EKG ?EKG Interpretation ? ?Date/Time:  Tuesday Mar 06 2022 12:03:01 EDT ?Ventricular Rate:  90 ?PR Interval:  149 ?QRS Duration: 105 ?QT Interval:  424 ?QTC Calculation: 480 ?R Axis:   78 ?Text Interpretation: Sinus rhythm Ventricular bigeminy Borderline low voltage, extremity leads Anteroseptal infarct, old Nonspecific T abnormalities, lateral leads since last tracing no significant change Confirmed by Daleen Bo [phone]) on 03/06/2022 1:53:21 PM ? ?Radiology ?DG Chest Port 1 View ? ?Result Date: 03/06/2022 ?CLINICAL DATA:  Shortness of breath. EXAM: PORTABLE CHEST 1 VIEW COMPARISON:  February 01, 2022. FINDINGS: Stable cardiomegaly. Bilateral pulmonary edema is noted. Bibasilar atelectasis is noted with small right pleural effusion. Bony thorax is unremarkable. IMPRESSION: Bilateral pulmonary edema and bibasilar atelectasis is noted with small right pleural effusion. Electronically Signed   By: Marijo Conception M.D.   On: 03/06/2022 12:32   ? ?Procedures ?Procedures  ? ? ?Medications Ordered in ED ?Medications  ?methylPREDNISolone sodium succinate (SOLU-MEDROL) 125 mg/2 mL injection 125 mg (has no administration in time range)  ?furosemide (LASIX) injection 80 mg (80 mg Intravenous Given 03/06/22  1331)  ?morphine (PF) 4 MG/ML injection 4 mg (4 mg Intravenous Given 03/06/22 1336)  ?ipratropium-albuterol (DUONEB) 0.5-2.5 (3) MG/3ML nebulizer solution 3 mL (3 mLs Nebulization Given 03/06/22 1415)  ? ? ?ED Course/ Medi

## 2022-03-06 NOTE — Assessment & Plan Note (Addendum)
-  due to COPD and CHF exacerbation. -chronically on 4L [place]>>d/c back home with 4L [place] -experiencing increased work of breathing and borderline saturation while using 15 L of oxygen.  He has been placed on BiPAP and admitted to stepdown bed. -IV diuresis has been started; will continue the use of isosorbide and metoprolol.  -Given chronic renal failure not a candidate for ACE or ARB regimen. -Cardiology consulted -[facility] protocol requested -Daily weights, low-sodium diet, strict intake and output, follow clinical response. -personally reviewed chest x-ray with interstitial edema

## 2022-03-06 NOTE — ED Notes (Signed)
John Foster, MD notified re: BNP 1020 ?

## 2022-03-06 NOTE — ED Notes (Signed)
Patient states he fell 1 week ago. Patient denies hitting head and states he does take eliquis.  ?

## 2022-03-06 NOTE — Progress Notes (Signed)
Pt on BIPAP fpr the night RT will continue to monitor ?

## 2022-03-06 NOTE — Assessment & Plan Note (Addendum)
-   Recent A1c (December 24, 2021) 7.1 -Holding oral hypoglycemic agents while inpatient -increase levemir to 15 units bid -increase premeal insulin to 12 units -Anticipating elevated CBGs/fluctuation with the use of his steroids

## 2022-03-06 NOTE — Progress Notes (Signed)
**Note De-Identified  Obfuscation** Patient removed from BIPAP and placed on 5 l , tolerating well at this time.  RRT to continue to monitor. ?

## 2022-03-06 NOTE — Assessment & Plan Note (Addendum)
-  Continue apixaban ?Continue metoprolol succinate ?Currently in sinus rhythm ? ?

## 2022-03-06 NOTE — Progress Notes (Signed)
Patient arrived to ICU room #11, due to void ?

## 2022-03-06 NOTE — Assessment & Plan Note (Signed)
-   Overall stable ?-Continue current antihypertensive regimen and diuresis ?-Follow vital signs. ?

## 2022-03-06 NOTE — Assessment & Plan Note (Addendum)
-   No chest pain -No acute ischemic changes appreciated on EKG -Continue metoprolol, Imdur, Plavix and statin.

## 2022-03-06 NOTE — ED Triage Notes (Signed)
Patient arrived via EMS with complaints of SOB. Per EMS patient was seen by PCP for this and increased lasix x3 days without relief. Patient also states he has not been urinating as frequently as normal with this increase. Patient states he has been satting in the 80's on his baseline 4L [place]. Patient is tachypneic and labored breathing upon assessment on 15L non rebreather.  ?

## 2022-03-06 NOTE — Assessment & Plan Note (Signed)
-   Patient with stage IIIb-stage IV (transitioning point currently). ?-Appears to be stable and at his baseline ?-Continue to follow renal function trend and electrolytes in the setting of acute diuresis. ?-Avoid as much as possible nephrotoxic agents, contrast and hypotension. ?

## 2022-03-06 NOTE — ED Notes (Signed)
Pt denies urge to urinate. States when catheterized, a standard catheter is used. ?

## 2022-03-06 NOTE — Telephone Encounter (Signed)
Spoke to patient about email he sent this morning.Stated he is struggling with sob at present,hard to walk across room.He has been having chest tightness.Rates sob and chest tightness # 8.Advised he needs to go to ED to be evaluated.I will make Dr.Crenshaw aware.  ?

## 2022-03-06 NOTE — Assessment & Plan Note (Signed)
Continue Crestor 

## 2022-03-06 NOTE — Assessment & Plan Note (Addendum)
-   Continue Flomax 

## 2022-03-06 NOTE — Assessment & Plan Note (Signed)
-   In the setting of COPD and CHF ?-Chronically using 4 L nasal cannula supplementation ?-Currently worsening decompensated ?-Will wean down oxygen supplementation to his baseline as tolerated. ?-Provide treatment for CHF and COPD exacerbation as discussed. ?

## 2022-03-06 NOTE — H&P (Signed)
?History and Physical  ? ? ?Patient: John [name] OZD:[id number] DOB: 1941-07-01 ?DOA: 03/06/2022 ?DOS: the patient was seen and examined on 03/06/2022 ?PCP: [name], DO  ?Patient coming from: Home ? ?Chief Complaint:  ?Chief Complaint  ?Patient presents with  ? Shortness of Breath  ? ?HPI: John [name] is a 81 y.o. 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  Patient reports symptoms has been ongoing for the last 4 days now and despite visit to his primary care physician who adjust his diuretics to double the dose for 3 days he had not noticed any improvement in symptoms and in fact has noticing even more shortness of breath.  On the day of admission EMS was contacted and on arrival patient was found with increased work of breathing and requiring up to 15 L without significant improvement.  Patient was transferred to the emergency department for further evaluation and management.  Patient reports orthopnea, sudden increase swelling in his lower extremities and also nonproductive coughing spells.  He denies chest pain, nausea, vomiting, dysuria, hematuria, melena, hematochezia, abdominal pain, or any other complaints. ? ?In the ED work-up demonstrated elevated BNP, chest x-ray suggesting worsening interstitial edema bilaterally with mild to moderate right pleural effusion.  BiPAP was initiated, steroids and bronchodilators provided.  Lasix has been started.  Cardiology service was consulted and recommended admission by the hospitalist service and they will follow along in consultation. Patient reports feeling slightly better with intervention provided.  TRH contacted to place patient in the hospital for further evaluation and  management. ? ?Review of Systems: As mentioned in the history of present illness. All other systems reviewed and are negative. ?Past Medical History:  ?Diagnosis Date  ? Anxiety   ? 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 BPH (benign prostatic hyperplasia)   ? CAD (coronary artery disease)   ? CHF (congestive heart failure) ([place])   ? Cholesteatoma of attic   ? Depression   ? Diabetes mellitus ([place])   ? Ear disease   ? GERD (gastroesophageal reflux disease)   ? Hyperlipidemia   ? Hypertension   ? PVD (peripheral vascular disease) ([place])   ? Stroke [facility])   ? ?Past Surgical History:  ?Procedure Laterality Date  ? BONE ANCHORED HEARING AID IMPLANT Left 06/25/2013  ? Procedure: BONE ANCHORED HEARING AID (BAHA) IMPLANT;  Surgeon: Fannie Knee, MD;  Location: [place];  Service: ENT;  Laterality: Left;  ? CAROTID STENT INSERTION Left   ? COCHLEAR IMPLANT    ? CORONARY STENT PLACEMENT    ? MASS EXCISION Left 06/25/2013  ? Procedure: EXCISION LEFT TEMPORAL MASS;  Surgeon: Fannie Knee, MD;  Location: [place];  Service: ENT;  Laterality: Left;  ? TONSILLECTOMY    ? ?Social History:  reports that he quit smoking about 32 years ago. His smoking use included cigarettes. He has never used smokeless tobacco. He reports that he does not currently use alcohol. He reports that he does not use drugs. ? ?Allergies  ?Allergen Reactions  ? Brilinta [Ticagrelor] Shortness Of Breath  ? Penicillins Hives  ? Ezetimibe-Simvastatin Other (See Comments)  ?  Myalgia ?  ? ? ?Family History  ?Problem Relation Age of Onset  ? Diabetes Other   ? Diabetes Mother   ?  Diabetes Maternal Aunt   ? Depression Other   ? Depression Other   ? Drug abuse Daughter   ? Diabetes Daughter   ? ? ?Prior to Admission medications   ?Medication Sig Start Date End Date Taking? Authorizing Provider  ?albuterol (VENTOLIN HFA) 108 (90 Base) MCG/ACT inhaler Inhale 2 puffs into the lungs every 6 (six) hours as needed for wheezing or shortness of breath. 12/20/20  Yes  Pokhrel, L[place], MD  ?allopurinol (ZYLOPRIM) 300 MG tablet Take 300 mg by mouth daily. 06/21/12  Yes [provider]  ?ALPRAZolam (XANAX) 0.25 MG tablet Take 1 tablet (0.25 mg total) by mouth 2 (two) times daily as needed for anxiety. 02/05/22  Yes Kathie Dike, MD  ?apixaban (ELIQUIS) 2.5 MG TABS tablet Take 1 tablet (2.5 mg total) by mouth 2 (two) times daily. 12/29/21  Yes Little Ishikawa, MD  ?cholecalciferol (VITAMIN D3) 25 MCG (1000 UNIT) tablet Take 1,000 Units by mouth daily.   Yes [provider]  ?clopidogrel (PLAVIX) 75 MG tablet Take 1 tablet (75 mg total) by mouth daily. 12/29/21  Yes Little Ishikawa, MD  ?Cyanocobalamin (VITAMIN B 12 PO) Take 1 tablet by mouth daily.   Yes [provider]  ?folic acid (FOLVITE) 1 MG tablet Take 1 tablet (1 mg total) by mouth daily. 12/29/21  Yes Little Ishikawa, MD  ?gabapentin (NEURONTIN) 300 MG capsule Take 1 capsule (300 mg total) by mouth at bedtime. 12/29/21  Yes Little Ishikawa, MD  ?HYDROcodone-acetaminophen (NORCO/VICODIN) 5-325 MG tablet Take 1 tablet by mouth every 8 (eight) hours as needed for moderate pain. 02/05/22 02/05/23 Yes Kathie Dike, MD  ?insulin aspart (NOVOLOG) 100 UNIT/ML injection Inject 7 Units into the skin 3 (three) times daily with meals. 02/05/22  Yes Kathie Dike, MD  ?insulin glargine (LANTUS SOLOSTAR) 100 UNIT/ML Solostar Pen Inject 35 Units into the skin daily. Dose per sliding scale. 02/05/22  Yes Kathie Dike, MD  ?ipratropium-albuterol (DUONEB) 0.5-2.5 (3) MG/3ML SOLN Take 3 mLs by nebulization 3 (three) times daily. 02/05/22  Yes Kathie Dike, MD  ?isosorbide dinitrate (ISORDIL) 20 MG tablet Take 1 tablet (20 mg total) by mouth 2 (two) times daily. 12/29/21  Yes Little Ishikawa, MD  ?metoprolol succinate (TOPROL-XL) 50 MG 24 hr tablet Take 1 tablet (50 mg total) by mouth daily. Take with or immediately following a meal. 02/06/22  Yes Memon, Jolaine Artist, MD  ?mometasone-formoterol  (DULERA) 200-5 MCG/ACT AERO Inhale 2 puffs into the lungs in the morning and at bedtime. 02/05/22  Yes Kathie Dike, MD  ?Multiple Vitamins-Minerals (CERTAVITE/ANTIOXIDANTS) TABS Take 1 tablet by mouth daily. 12/29/21  Yes Little Ishikawa, MD  ?nitroGLYCERIN (NITROSTAT) 0.4 MG SL tablet Place one tablet underneath tongue with chest pain, can repeat in 5 minutes if chest pain persists and also call 911 at that time Sublingual   Yes [provider]  ?pantoprazole (PROTONIX) 40 MG tablet Take 40 mg by mouth daily with breakfast. 08/19/14  Yes [provider]  ?rosuvastatin (CRESTOR) 10 MG tablet Take 1 tablet (10 mg total) by mouth every evening. 12/29/21  Yes Little Ishikawa, MD  ?sertraline (ZOLOFT) 25 MG tablet Take 1 tablet (25 mg total) by mouth every evening. 01/09/21  Yes Thayer Headings, PMHNP  ?tamsulosin (FLOMAX) 0.4 MG CAPS capsule Take 0.4 mg by mouth daily. 09/22/14  Yes [provider]  ?thiamine 100 MG tablet Take 1 tablet (100 mg total) by mouth daily. 12/30/21  Yes Little Ishikawa, MD  ?torsemide (  DEMADEX) 20 MG tablet Take 2 tablets (40 mg total) by mouth daily. ?Patient taking differently: Take 40 mg by mouth 2 (two) times daily. 02/05/22  Yes Kathie Dike, MD  ?Insulin Pen Needle 32G X 4 MM MISC Use to inject insulin up to 4 times daily. 12/29/21   Little Ishikawa, MD  ? ? ?Physical Exam: ?Vitals:  ? 03/06/22 1300 03/06/22 1330 03/06/22 1408 03/06/22 1415  ?BP: 127/71 117/86    ?Pulse: 72 72 81   ?Resp: 19 (!) 22    ?Temp:      ?TempSrc:      ?SpO2: 100% 100% 96% 96%  ?Weight:      ?Height:      ? ?General exam: Alert, awake, oriented x 3; having difficulty speaking in full sentences and demonstrating tachypnea.  No chest pain, no palpitations, no nausea, no vomiting, no fever. ?Respiratory system: Positive tachypnea, bilateral rhonchi and fine crackles appreciated at the bases.  Mild expiratory wheezing with fair air movement noticed on  exam. ?Cardiovascular system: Rate controlled, no rubs, no gallops, no JVD appreciated. ?Gastrointestinal system: Abdomen is nondistended, soft and nontender. No organomegaly or masses felt. Normal bowel sounds heard. ?Cen

## 2022-03-06 NOTE — ED Notes (Signed)
I did it at 12:15 when I spoke with you about his request for BIPAP, I can try it again, but  he is continually complaining of SOB and still has labored breathing ?

## 2022-03-06 NOTE — ED Notes (Signed)
Pts daughter at bedside and reports that the pt requires a foley cath often d/t prostate issues that cause poor UO ?

## 2022-03-06 NOTE — Progress Notes (Signed)
Patient still requiring BiPAP at this time w/O2 SAT 95%, unable to safely swallow PO meds at this time, called pharmacy & spoke with Remo Lipps regarding adjusting medication times if needed  ?

## 2022-03-07 DIAGNOSIS — N184 Chronic kidney disease, stage 4 (severe): Secondary | ICD-10-CM | POA: Diagnosis not present

## 2022-03-07 DIAGNOSIS — I5043 Acute on chronic combined systolic (congestive) and diastolic (congestive) heart failure: Secondary | ICD-10-CM | POA: Diagnosis not present

## 2022-03-07 DIAGNOSIS — J9621 Acute and chronic respiratory failure with hypoxia: Secondary | ICD-10-CM | POA: Diagnosis not present

## 2022-03-07 DIAGNOSIS — I5023 Acute on chronic systolic (congestive) heart failure: Secondary | ICD-10-CM | POA: Diagnosis not present

## 2022-03-07 LAB — GLUCOSE, CAPILLARY
Glucose-Capillary: 254 mg/dL — ABNORMAL HIGH (ref 70–99)
Glucose-Capillary: 243 mg/dL — ABNORMAL HIGH (ref 70–99)
Glucose-Capillary: 270 mg/dL — ABNORMAL HIGH (ref 70–99)
Glucose-Capillary: 306 mg/dL — ABNORMAL HIGH (ref 70–99)

## 2022-03-07 LAB — BASIC METABOLIC PANEL
Anion gap: 8 (ref 5–15)
BUN: 49 mg/dL — ABNORMAL HIGH (ref 8–23)
CO2: 29 mmol/L (ref 22–32)
Calcium: 8.4 mg/dL — ABNORMAL LOW (ref 8.9–10.3)
Chloride: 99 mmol/L (ref 98–111)
Creatinine, Ser: 2.15 mg/dL — ABNORMAL HIGH (ref 0.61–1.24)
GFR, Estimated: 30 mL/min — ABNORMAL LOW (ref >60)
Glucose, Bld: 290 mg/dL — ABNORMAL HIGH (ref 70–99)
Potassium: 4.4 mmol/L (ref 3.5–5.1)
Sodium: 136 mmol/L (ref 135–145)

## 2022-03-07 MED ORDER — INSULIN ASPART 100 UNIT/ML IJ SOLN
3.0000 [IU] | Freq: Three times a day (TID) | INTRAMUSCULAR | Status: DC
  Administered 2022-03-07: 3 [IU] via SUBCUTANEOUS

## 2022-03-07 MED ORDER — METHYLPREDNISOLONE SODIUM SUCC 125 MG IJ SOLR: 60.0000 mg | Freq: Two times a day (BID) | INTRAMUSCULAR | Status: DC

## 2022-03-07 MED ORDER — INSULIN DETEMIR 100 UNIT/ML ~~LOC~~ SOLN
12.0000 [IU] | Freq: Two times a day (BID) | SUBCUTANEOUS | Status: DC
  Administered 2022-03-07: 12 [IU] via SUBCUTANEOUS
  Filled 2022-03-07 (×2): qty 0.12

## 2022-03-07 MED ORDER — CHLORHEXIDINE GLUCONATE CLOTH 2 % EX PADS
6.0000 | MEDICATED_PAD | Freq: Every day | CUTANEOUS | Status: DC
  Administered 2022-03-07 – 2022-03-09 (×3): 6 via TOPICAL

## 2022-03-07 MED ORDER — METHYLPREDNISOLONE SODIUM SUCC 125 MG IJ SOLR
120.0000 mg | Freq: Two times a day (BID) | INTRAMUSCULAR | Status: DC
  Administered 2022-03-07 – 2022-03-08 (×2): 120 mg via INTRAVENOUS
  Filled 2022-03-07 (×2): qty 2

## 2022-03-07 MED ORDER — IPRATROPIUM-ALBUTEROL 0.5-2.5 (3) MG/3ML IN SOLN
3.0000 mL | Freq: Four times a day (QID) | RESPIRATORY_TRACT | Status: DC
  Administered 2022-03-07 – 2022-03-12 (×21): 3 mL via RESPIRATORY_TRACT
  Filled 2022-03-07 (×4): qty 3
  Filled 2022-03-07: qty 6
  Filled 2022-03-07 (×4): qty 3
  Filled 2022-03-07: qty 6
  Filled 2022-03-07 (×6): qty 3

## 2022-03-07 MED ORDER — FUROSEMIDE 10 MG/ML IJ SOLN
80.0000 mg | Freq: Two times a day (BID) | INTRAMUSCULAR | Status: DC
Start: 2022-03-07 — End: 2022-03-08
  Administered 2022-03-07 – 2022-03-08 (×2): 80 mg via INTRAVENOUS
  Filled 2022-03-07 (×2): qty 8

## 2022-03-07 MED ORDER — HYDRALAZINE HCL 25 MG PO TABS
25.0000 mg | ORAL_TABLET | Freq: Two times a day (BID) | ORAL | Status: DC
  Administered 2022-03-07 – 2022-03-13 (×12): 25 mg via ORAL
  Filled 2022-03-07 (×13): qty 1

## 2022-03-07 NOTE — Assessment & Plan Note (Addendum)
Continue metoprolol succinate Hydralazine added -not a candidate for ACEi or ARB due to CKD

## 2022-03-07 NOTE — Progress Notes (Incomplete)
Attending note ? ?Patient seen and discussed with PA Ahmed Prima, I agree with her documetation. 81 yo male history of chronic systolic HF/ICM, CAD with prior, PAF, CKD 3b, HL, HTN, PAD, presents with SOB. Started on bipap and remains so, breathing comfortably tolerating bipap ? ? ? ? ?K 4.1 Cr 2.13 BUN 45 GFR 30 WBC 11.4 Hgb 9.5 Plt 182 BNP 1020(968 1 month ago). Mg 1.9  ?CXR bilateral pulmonary edema ?EKG SR, PVCs ? ? ?12/2021 echo: LVE 35%, grade II dd, normal RV ?01/26/22 echo: LVE 30-35% ? ? ?1.Acute on chronic HFrEF/ICM ?- 12/2021 echo: LVE 35%, grade II dd, normal RV ?- 01/26/22 echo: LVE 30-35% ?- CXR pulm edema, BNP 1020 up from 968 1 month ago. ReDS vest data is pending.  ?- neg 1.3 L thus far, he received lasix total 140mg  yesterday, now on 80mg  bid. Very slight uptrend in Cr, continue IV diuresis ?- medical therapy limited by renal dysfunction ?- continue toprol 50mg  daily. Could add hydral to his nitrate pending bp's with diuresis. From last cards note had been on hydral but I don't see on admit meds, start hydral 25mg  bid.   ?-From Dr Jacalyn Lefevre note considered SGLT2i but avoiding due to low GFR.  ? ? ?2. CAD ?- multiple prior stents ?- recent NSTEMI managed medically due to renal disease ?- continue medial therapy, has been on eliquis/plavix ? ? ?3. PAF ?- continue rate control, eliquis ? ?4. CKD IIIb/IV ? ?5. COPD exacerbation ?- per primary team ? ?6. History of urinary retention ?- primary team monitoring, may require foley   ? ? ?Carlyle Dolly MD ? ? ?

## 2022-03-07 NOTE — Assessment & Plan Note (Signed)
No chest pain presently ?Continue plavix, crestor, metoprolol succinate ?

## 2022-03-07 NOTE — Assessment & Plan Note (Addendum)
Patient had difficulty during last hospitalization requiring Foley catheter Check bladder scan Did not have to place foley this hospitalization

## 2022-03-07 NOTE — Plan of Care (Signed)
?  Problem: Education: ?Goal: Ability to demonstrate management of disease process will improve ?Outcome: Progressing ?Goal: Ability to verbalize understanding of medication therapies will improve ?Outcome: Progressing ?Goal: Individualized Educational Video(s) ?Outcome: Progressing ?  ?Problem: Activity: ?Goal: Capacity to carry out activities will improve ?Outcome: Progressing ?  ?Problem: Cardiac: ?Goal: Ability to achieve and maintain adequate cardiopulmonary perfusion will improve ?Outcome: Progressing ?  ?Problem: Education: ?Goal: Knowledge of disease or condition will improve ?Outcome: Progressing ?Goal: Knowledge of the prescribed therapeutic regimen will improve ?Outcome: Progressing ?Goal: Individualized Educational Video(s) ?Outcome: Progressing ?  ?Problem: Activity: ?Goal: Ability to tolerate increased activity will improve ?Outcome: Progressing ?Goal: Will verbalize the importance of balancing activity with adequate rest periods ?Outcome: Progressing ?  ?Problem: Respiratory: ?Goal: Ability to maintain a clear airway will improve ?Outcome: Progressing ?Goal: Levels of oxygenation will improve ?Outcome: Progressing ?Goal: Ability to maintain adequate ventilation will improve ?Outcome: Progressing ?  ?

## 2022-03-07 NOTE — Assessment & Plan Note (Addendum)
Baseline creatinine 2.1-2.4 Will need to tolerate increase in serum creatinine for euvolemia Serum creatinine

## 2022-03-07 NOTE — Assessment & Plan Note (Addendum)
Continue Pulmicort and Brovana Continue DuoNebs Continue IV Solu-Medrol>>de-escalated to prednisone and wean off Continues to require intermittent BiPAP>>now only night time CPAP Finished one week steroids during hospitalization

## 2022-03-07 NOTE — Hospital Course (Signed)
81 year old male with a history of recent NSTEMI 12/2021, diabetes mellitus type 2, hyperlipidemia, hypertension, paroxysmal atrial fibrillation, GERD, BPH, chronic respiratory failure on 4 L, and CKD stage 4 presenting with shortness of breath for about 1 week.  He stated that his legs have not been significantly out more swollen but had been having increasing abdominal girth and orthopnea type symptoms.  He quit smoking over 40 years ago.  Upon EMS arrival, the patient was noted to have some respiratory distress and placed on 15 L [place].  He was placed on BiPAP in the emergency department.  He states that he had been taking double his usual dosage of torsemide for the past 3 days prior to admission without improvement.  He normally takes torsemide 40 mg daily. ?Notably, the patient had a recent hospital admission from 01/26/2022 to 02/05/2022 for acute on chronic respiratory failure due to CHF exacerbation and COPD exacerbation.  His discharge weight was 184 pounds.  He was discharged home with torsemide 40 mg daily. ?He was also recently admitted to the hospital from 01/08/2022 to 01/10/2022 for acute on chronic systolic and diastolic CHF.  He was discharged home with torsemide 20 mg daily. ?In the ED, the patient was afebrile and hemodynamically stable with oxygen saturation 98% on BiPAP.  BMP showed sodium 134 and serum creatinine 2.13.  WBC 11.4, hemoglobin 9.5, platelets 282,000.  LFTs were unremarkable.  Chest x-ray showed pulmonary edema.  EKG shows sinus rhythm with bigeminy.  The patient was started on IV steroids, IV furosemide, and bronchodilators.  Cardiology was consulted to assist with management. ?

## 2022-03-07 NOTE — Plan of Care (Signed)
  Problem: Education: Goal: Ability to demonstrate management of disease process will improve Outcome: Progressing Goal: Ability to verbalize understanding of medication therapies will improve Outcome: Progressing Goal: Individualized Educational Video(s) Outcome: Progressing   Problem: Activity: Goal: Capacity to carry out activities will improve Outcome: Progressing   Problem: Cardiac: Goal: Ability to achieve and maintain adequate cardiopulmonary perfusion will improve Outcome: Progressing   Problem: Education: Goal: Knowledge of disease or condition will improve Outcome: Progressing Goal: Knowledge of the prescribed therapeutic regimen will improve Outcome: Progressing Goal: Individualized Educational Video(s) Outcome: Progressing   Problem: Activity: Goal: Ability to tolerate increased activity will improve Outcome: Progressing Goal: Will verbalize the importance of balancing activity with adequate rest periods Outcome: Progressing   Problem: Respiratory: Goal: Ability to maintain a clear airway will improve Outcome: Progressing Goal: Levels of oxygenation will improve Outcome: Progressing Goal: Ability to maintain adequate ventilation will improve Outcome: Progressing   Problem: Education: Goal: Knowledge of General Education information will improve Description Including pain rating scale, medication(s)/side effects and non-pharmacologic comfort measures Outcome: Progressing   Problem: Health Behavior/Discharge Planning: Goal: Ability to manage health-related needs will improve Outcome: Progressing   Problem: Clinical Measurements: Goal: Ability to maintain clinical measurements within normal limits will improve Outcome: Progressing Goal: Will remain free from infection Outcome: Progressing Goal: Diagnostic test results will improve Outcome: Progressing Goal: Respiratory complications will improve Outcome: Progressing Goal: Cardiovascular complication  will be avoided Outcome: Progressing   Problem: Activity: Goal: Risk for activity intolerance will decrease Outcome: Progressing   Problem: Nutrition: Goal: Adequate nutrition will be maintained Outcome: Progressing   Problem: Coping: Goal: Level of anxiety will decrease Outcome: Progressing   Problem: Elimination: Goal: Will not experience complications related to bowel motility Outcome: Progressing Goal: Will not experience complications related to urinary retention Outcome: Progressing   Problem: Pain Managment: Goal: General experience of comfort will improve Outcome: Progressing   Problem: Safety: Goal: Ability to remain free from injury will improve Outcome: Progressing   Problem: Skin Integrity: Goal: Risk for impaired skin integrity will decrease Outcome: Progressing   

## 2022-03-07 NOTE — Assessment & Plan Note (Signed)
-   He is s/p right fem-pop bypass at [facility] in 05/2018. Remains on Plavix and statin therapy.  ?? ?

## 2022-03-07 NOTE — Progress Notes (Signed)
BIPAP on for the night and pressure back down to 12 ?

## 2022-03-07 NOTE — Assessment & Plan Note (Addendum)
12/25/21 echo EF 35%, global HK, grade 2 DD 01/26/22 Echo  EF 30-35%, global HK Appreciate cardiology consult Continue IV furosemide to 80 mg bid Daily weights--not accurate Accurate I's and O's-=-incomplete 01/30/22 High Res CT chest>>Persistent bilateral bronchial wall thickening and interlobularseptal thickening, likely due to pulmonary edema. --obtain ReDS reading 5/18--50%>>41% --limited echo--EF 25-30%; trivial pericardial eff Plan to d/c home with torsemide 40 mg bid

## 2022-03-07 NOTE — Consult Note (Addendum)
?Cardiology Consultation:  ? ?Patient ID: John [name] ?MRN: [id number]; DOB: 1941/01/05 ? ?Admit date: 03/06/2022 ?Date of Consult: 03/07/2022 ? ?PCP:  [name], DO ?  ?[facility] Providers ?Cardiologist:  Kirk Ruths, MD      ? ? ?Patient Profile:  ? ?John [name] is a 81 y.o. male with a hx of  CAD (s/p multiple stents with most recent cath in 2013 showing EF of 40% with DESx2 to LAD, NSTEMI in 12/2021 with EF of 35% and medical management recommended given his AKI), HFrEF (EF 40-45% in 08/2021, at 35% in 12/2021, 30-35% in 01/2022), paroxysmal atrial fibrillation, Type 2 DM, HTN, HLD, PAD (s/p right fem-pop bypass at [facility] in 05/2018), Stage 3-4 CKD, COPD, OSA and prior CVA  who is being seen 03/07/2022 for the evaluation of CHF at the request of Dr. Dyann Kief. ? ?History of Present Illness:  ? ?Mr. Blatz was last examined by the Cardiology service during an admission in 01/2022 for acute on chronic hypoxic respiratory failure in the setting of a CHF exacerbation. He diuresed 4.7 L during admission and weight at the time of discharge was 184 lbs. He was discharged on Torsemide 40 mg daily along with Plavix 75 mg daily, Toprol-XL 50 mg daily, Eliquis 2.5 mg twice daily, Crestor 10 mg daily and Isordil 20 mg twice daily. ? ?He presented to [facility] ED on 03/06/2022 for evaluation of worsening dyspnea and oxygen saturations were in the 80's on 4 L nasal cannula. In talking with the patient and his daughter today, he reports he has been having worsening dyspnea on exertion for the past week and symptoms continued to progress. He felt full in his chest but denies any specific chest pain. He did see his PCP last week who increased his Torsemide to 80 mg daily for 3 days  following a 5 lb weight gain in 1 week but he did not notice increased urination with this. He does report having prostate issues and has required in and out catheterizations this admission. He reports having worsening orthopnea prior  to admission and only noted isolated edema along his left ankle. No specific abdominal distention. ? ?Initial labs showed WBC 11.4, Hgb 9.5, platelets 182, Na+ 139, K+ 4.1 and creatinine 2.13 (close to baseline). AST 12 and ALT 11. BNP 1020. Mg 2.1.  CXR showed bilateral pulmonary edema and bibasilar atelectasis with small right pleural effusion. EKG shows NSR, HR 90 with PVC's and episodes of ventricular bigeminy.  ? ?He received IV Lasix 80mg  for one dose followed by 60mg  yesterday evening. Received additional 60mg  this AM and already titrated to 80mg  BID for later today. Weights have been variable as this was listed as 182 lbs on admission and 186 lbs today. Creatinine remains stable at 2.15 and K+ is normal at 4.4.  ? ? ?Past Medical History:  ?Diagnosis Date  ? Anxiety   ? 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 BPH (benign prostatic hyperplasia)   ? CAD (coronary artery disease)   ? CHF (congestive heart failure) ([place])   ? Cholesteatoma of attic   ? Depression   ? Diabetes mellitus ([place])   ? Ear disease   ? GERD (gastroesophageal reflux disease)   ? Hyperlipidemia   ? Hypertension   ? PVD (peripheral vascular disease) ([place])   ? Stroke [facility]   ? ? ?Past Surgical History:  ?Procedure Laterality Date  ? BONE ANCHORED HEARING AID IMPLANT Left 06/25/2013  ? Procedure: BONE ANCHORED HEARING AID (BAHA) IMPLANT;  Surgeon:  Fannie Knee, MD;  Location: [place];  Service: ENT;  Laterality: Left;  ? CAROTID STENT INSERTION Left   ? COCHLEAR IMPLANT    ? CORONARY STENT PLACEMENT    ? MASS EXCISION Left 06/25/2013  ? Procedure: EXCISION LEFT TEMPORAL MASS;  Surgeon: Fannie Knee, MD;  Location: [place];  Service: ENT;  Laterality: Left;  ? TONSILLECTOMY    ?  ? ?Home Medications:  ?Prior to Admission medications   ?Medication Sig Start Date End Date Taking? Authorizing Provider  ?albuterol (VENTOLIN HFA) 108 (90 Base) MCG/ACT inhaler Inhale 2 puffs into the lungs every 6 (six) hours as needed for wheezing or  shortness of breath. 12/20/20  Yes Pokhrel, Laxman, MD  ?allopurinol (ZYLOPRIM) 300 MG tablet Take 300 mg by mouth daily. 06/21/12  Yes [provider]  ?ALPRAZolam (XANAX) 0.25 MG tablet Take 1 tablet (0.25 mg total) by mouth 2 (two) times daily as needed for anxiety. 02/05/22  Yes Kathie Dike, MD  ?apixaban (ELIQUIS) 2.5 MG TABS tablet Take 1 tablet (2.5 mg total) by mouth 2 (two) times daily. 12/29/21  Yes Little Ishikawa, MD  ?cholecalciferol (VITAMIN D3) 25 MCG (1000 UNIT) tablet Take 1,000 Units by mouth daily.   Yes [provider]  ?clopidogrel (PLAVIX) 75 MG tablet Take 1 tablet (75 mg total) by mouth daily. 12/29/21  Yes Little Ishikawa, MD  ?Cyanocobalamin (VITAMIN B 12 PO) Take 1 tablet by mouth daily.   Yes [provider]  ?folic acid (FOLVITE) 1 MG tablet Take 1 tablet (1 mg total) by mouth daily. 12/29/21  Yes Little Ishikawa, MD  ?gabapentin (NEURONTIN) 300 MG capsule Take 1 capsule (300 mg total) by mouth at bedtime. 12/29/21  Yes Little Ishikawa, MD  ?HYDROcodone-acetaminophen (NORCO/VICODIN) 5-325 MG tablet Take 1 tablet by mouth every 8 (eight) hours as needed for moderate pain. 02/05/22 02/05/23 Yes Kathie Dike, MD  ?insulin aspart (NOVOLOG) 100 UNIT/ML injection Inject 7 Units into the skin 3 (three) times daily with meals. 02/05/22  Yes Kathie Dike, MD  ?insulin glargine (LANTUS SOLOSTAR) 100 UNIT/ML Solostar Pen Inject 35 Units into the skin daily. Dose per sliding scale. 02/05/22  Yes Kathie Dike, MD  ?ipratropium-albuterol (DUONEB) 0.5-2.5 (3) MG/3ML SOLN Take 3 mLs by nebulization 3 (three) times daily. 02/05/22  Yes Kathie Dike, MD  ?isosorbide dinitrate (ISORDIL) 20 MG tablet Take 1 tablet (20 mg total) by mouth 2 (two) times daily. 12/29/21  Yes Little Ishikawa, MD  ?metoprolol succinate (TOPROL-XL) 50 MG 24 hr tablet Take 1 tablet (50 mg total) by mouth daily. Take with or immediately following a meal. 02/06/22  Yes Memon,  Jolaine Artist, MD  ?mometasone-formoterol (DULERA) 200-5 MCG/ACT AERO Inhale 2 puffs into the lungs in the morning and at bedtime. 02/05/22  Yes Kathie Dike, MD  ?Multiple Vitamins-Minerals (CERTAVITE/ANTIOXIDANTS) TABS Take 1 tablet by mouth daily. 12/29/21  Yes Little Ishikawa, MD  ?nitroGLYCERIN (NITROSTAT) 0.4 MG SL tablet Place one tablet underneath tongue with chest pain, can repeat in 5 minutes if chest pain persists and also call 911 at that time Sublingual   Yes [provider]  ?pantoprazole (PROTONIX) 40 MG tablet Take 40 mg by mouth daily with breakfast. 08/19/14  Yes [provider]  ?rosuvastatin (CRESTOR) 10 MG tablet Take 1 tablet (10 mg total) by mouth every evening. 12/29/21  Yes Little Ishikawa, MD  ?sertraline (ZOLOFT) 25 MG tablet Take 1 tablet (25 mg total) by  mouth every evening. 01/09/21  Yes Thayer Headings, PMHNP  ?tamsulosin (FLOMAX) 0.4 MG CAPS capsule Take 0.4 mg by mouth daily. 09/22/14  Yes [provider]  ?thiamine 100 MG tablet Take 1 tablet (100 mg total) by mouth daily. 12/30/21  Yes Little Ishikawa, MD  ?torsemide (DEMADEX) 20 MG tablet Take 2 tablets (40 mg total) by mouth daily. ?Patient taking differently: Take 40 mg by mouth 2 (two) times daily. 02/05/22  Yes Kathie Dike, MD  ?Insulin Pen Needle 32G X 4 MM MISC Use to inject insulin up to 4 times daily. 12/29/21   Little Ishikawa, MD  ? ? ?Inpatient Medications: ?Scheduled Meds: ? allopurinol  300 mg Oral Daily  ? apixaban  2.5 mg Oral BID  ? arformoterol  15 mcg Nebulization BID  ? budesonide (PULMICORT) nebulizer solution  0.5 mg Nebulization Q12H  ? chlorhexidine  15 mL Mouth Rinse BID  ? Chlorhexidine Gluconate Cloth  6 each Topical Daily  ? clopidogrel  75 mg Oral Daily  ? furosemide  80 mg Intravenous Q12H  ? gabapentin  300 mg Oral QHS  ? insulin aspart  0-15 Units Subcutaneous TID WC  ? insulin detemir  9 Units Subcutaneous BID  ? ipratropium-albuterol  3 mL Nebulization Q6H   ? isosorbide dinitrate  20 mg Oral BID  ? mouth rinse  15 mL Mouth Rinse q12n4p  ? methylPREDNISolone (SOLU-MEDROL) injection  120 mg Intravenous Q12H  ? metoprolol succinate  50 mg Oral Daily  ? multivitamin

## 2022-03-07 NOTE — Progress Notes (Signed)
?  ?       ?PROGRESS NOTE ? ?John [name] PYK:[id number] DOB: Nov 23, 1940 DOA: 03/06/2022 ?PCP: [name], DO ? ?Brief History:  ?81 year old male with a history of recent NSTEMI 12/2021, diabetes mellitus type 2, hyperlipidemia, hypertension, paroxysmal atrial fibrillation, GERD, BPH, chronic respiratory failure on 4 L, and CKD stage 4 presenting with shortness of breath for about 1 week.  He stated that his legs have not been significantly out more swollen but had been having increasing abdominal girth and orthopnea type symptoms.  He quit smoking over 40 years ago.  Upon EMS arrival, the patient was noted to have some respiratory distress and placed on 15 L [place].  He was placed on BiPAP in the emergency department.  He states that he had been taking double his usual dosage of torsemide for the past 3 days prior to admission without improvement.  He normally takes torsemide 40 mg daily. ?Notably, the patient had a recent hospital admission from 01/26/2022 to 02/05/2022 for acute on chronic respiratory failure due to CHF exacerbation and COPD exacerbation.  His discharge weight was 184 pounds.  He was discharged home with torsemide 40 mg daily. ?He was also recently admitted to the hospital from 01/08/2022 to 01/10/2022 for acute on chronic systolic and diastolic CHF.  He was discharged home with torsemide 20 mg daily. ?In the ED, the patient was afebrile and hemodynamically stable with oxygen saturation 98% on BiPAP.  BMP showed sodium 134 and serum creatinine 2.13.  WBC 11.4, hemoglobin 9.5, platelets 282,000.  LFTs were unremarkable.  Chest x-ray showed pulmonary edema.  EKG shows sinus rhythm with bigeminy.  The patient was started on IV steroids, IV furosemide, and bronchodilators.  Cardiology was consulted to assist with management.  ? ? ?Assessment and Plan: ?* Acute on chronic respiratory failure with hypoxia (HCC) ?-due to COPD and CHF exacerbation. ?-chronically on 4L [place] ?-experiencing increased work of  breathing and borderline saturation while using 15 L of oxygen.  He has been placed on BiPAP and admitted to stepdown bed. ?-IV diuresis has been started; will continue the use of isosorbide and metoprolol.  -Given chronic renal failure not a candidate for ACE or ARB regimen. ?-Cardiology consulted ?-REDSCLIP protocol requested ?-Daily weights, low-sodium diet, strict intake and output, follow clinical response. ?-personally reviewed chest x-ray with interstitial edema ? ?COPD exacerbation ([place]) ?Continue Pulmicort and Brovana ?Start DuoNebs ?Continue IV Solu-Medrol ?Continues to require intermittent BiPAP ? ?Acute on chronic combined systolic and diastolic CHF (congestive heart failure) ([place]) ?12/25/21 echo EF 35%, global HK, grade 2 DD ?Increase IV furosemide to 80 mg bid ?Daily weights--not accurate ?Accurate I's and O's-=-incomplete ?4/11 High Res CT chest>>Persistent bilateral bronchial wall thickening and interlobularseptal thickening, likely due to pulmonary edema. ? ?CKD (chronic kidney disease) stage 4, GFR 15-29 ml/min (HCC) ?Baseline creatinine 2.1-2.4 ?Monitor with diuresis ? ?Paroxysmal atrial fibrillation (HCC) ?-Continue apixaban ?Continue metoprolol succinate ?Currently in sinus rhythm ? ? ?Coronary atherosclerosis of native coronary artery ?No chest pain presently ?Continue plavix, crestor, metoprolol succinate ? ?Acute urinary retention ?Patient had difficulty during last hospitalization requiring Foley catheter ?Check bladder scan ?Place Foley if continues to have retention ? ?BPH (benign prostatic hyperplasia) ?-Continue Flomax. ? ?Type 2 diabetes mellitus with hyperglycemia ([place]) ?- Recent A1c (March 2023) 7.1 ?-Holding oral hypoglycemic agents while inpatient ?-Continue the use of sliding scale insulin and long-acting insulin. ?-Anticipating elevated CBGs/fluctuation with the use of his steroids ? ?Essential hypertension ?Continue metoprolol succinate ?-not a candidate for ACEi  or ARB due to  CKD ? ?Peripheral vascular disease ([place]) ?- He is s/p right fem-pop bypass at [facility] in 05/2018. Remains on Plavix and statin therapy.  ?  ? ?Mixed hyperlipidemia ?- Continue Crestor. ? ?CAD (coronary artery disease) of artery bypass graft ?- No chest pain ?-No acute ischemic changes appreciated on EKG ?-Continue the use of beta-blocker, Imdur, Plavix and statin. ? ? ? ? ?Family Communication:   daughter updated at bedside 5/17 ? ?Consultants:  cardiology ? ?Code Status:  FULL  ? ?DVT Prophylaxis:  apixaban ? ? ?Procedures: ?As Listed in Progress Note Above ? ?Antibiotics: ?None ? ? ? ? ?Subjective: ?Patient complains of shortness of breath.  He is requesting to go back on BiPAP.  She denies any chest pain, fevers, chills, hemoptysis, nausea, vomiting, direct abdominal pain. ? ?Objective: ?Vitals:  ? 03/07/22 0400 03/07/22 0500 03/07/22 0700 03/07/22 0800  ?BP: (!) 145/101 (!) 159/80 (!) 159/81 (!) 166/84  ?Pulse: (!) 58 73 67 75  ?Resp: 12 (!) 25 (!) 21 (!) 23  ?Temp: 98.5 ?F (36.9 ?C)     ?TempSrc: Oral     ?SpO2: 96% 94% 94% 91%  ?Weight:  84.4 kg    ?Height:      ? ? ?Intake/Output Summary (Last 24 hours) at 03/07/2022 0816 ?Last data filed at 03/07/2022 0500 ?Gross per 24 hour  ?Intake 280 ml  ?Output 1600 ml  ?Net -1320 ml  ? ?Weight change:  ?Exam: ? ?General:  Pt is alert, follows commands appropriately, not in acute distress ?HEENT: No icterus, No thrush, No neck mass, [place]/AT ?Cardiovascular: RRR, S1/S2, no rubs, no gallops ?Respiratory: Bibasilar crackles.  Bilateral expiratory wheeze. ?Abdomen: Soft/+BS, non tender, non distended, no guarding ?Extremities: Trace  LEedema, No lymphangitis, No petechiae, No rashes, no synovitis ? ? ?Data Reviewed: ?I have personally reviewed following labs and imaging studies ?Basic Metabolic Panel: ?Recent Labs  ?Lab 03/06/22 ?1214 03/06/22 ?2237 03/07/22 ?0410  ?NA 139  --  136  ?K 4.1  --  4.4  ?CL 100  --  99  ?CO2 31  --  29  ?GLUCOSE 208*  --  290*  ?BUN 45*  --   49*  ?CREATININE 2.13*  --  2.15*  ?CALCIUM 8.9  --  8.4*  ?MG 1.9 2.1  --   ?PHOS 3.8  --   --   ? ?Liver Function Tests: ?Recent Labs  ?Lab 03/06/22 ?1214  ?AST 12*  ?ALT 11  ?ALKPHOS 61  ?BILITOT 0.9  ?PROT 6.8  ?ALBUMIN 3.5  ? ?No results for input(s): LIPASE, AMYLASE in the last 168 hours. ?No results for input(s): AMMONIA in the last 168 hours. ?Coagulation Profile: ?No results for input(s): INR, PROTIME in the last 168 hours. ?CBC: ?Recent Labs  ?Lab 03/06/22 ?1214  ?WBC 11.4*  ?NEUTROABS 9.9*  ?HGB 9.5*  ?HCT 31.0*  ?MCV 94.2  ?PLT 182  ? ?Cardiac Enzymes: ?No results for input(s): CKTOTAL, CKMB, CKMBINDEX, TROPONINI in the last 168 hours. ?BNP: ?Invalid input(s): POCBNP ?CBG: ?Recent Labs  ?Lab 03/06/22 ?1740 03/06/22 ?2017  ?GLUCAP 154* 289*  ? ?HbA1C: ?No results for input(s): HGBA1C in the last 72 hours. ?Urine analysis: ?   ?Component Value Date/Time  ? [place] YELLOW 01/26/2022 2100  ? APPEARANCEUR CLEAR 01/26/2022 2100  ? LABSPEC 1.011 01/26/2022 2100  ? PHURINE 5.0 01/26/2022 2100  ? GLUCOSEU 150 (A) 01/26/2022 2100  ? [place] NEGATIVE 01/26/2022 2100  ? [place] NEGATIVE 01/26/2022 2100  ? KETONESUR NEGATIVE  01/26/2022 2100  ? [place] NEGATIVE 01/26/2022 2100  ? NITRITE NEGATIVE 01/26/2022 2100  ? LEUKOCYTESUR NEGATIVE 01/26/2022 2100  ? ?Sepsis Labs: ?@[email or web address] (procalcitonin:4,lacticidven:4) ?) ?Recent Results (from the past 240 hour(s))  ?MRSA Next Gen by PCR, Nasal     Status: None  ? Collection Time: 03/06/22  7:13 PM  ? Specimen: Nasal Mucosa; Nasal Swab  ?Result Value Ref Range Status  ? MRSA by PCR Next Gen NOT DETECTED NOT DETECTED Final  ?  Comment: (NOTE) ?The GeneXpert MRSA Assay (FDA approved for NASAL specimens only), ?is one component of a comprehensive MRSA colonization surveillance ?program. It is not intended to diagnose MRSA infection nor to guide ?or monitor treatment for MRSA infections. ?Test performance is not FDA approved in patients less than 2 years ?old. ?Performed  at [facility], [address], [place], [place] [address] ?  ?  ? ?Scheduled Meds: ? allopurinol  300 mg Oral Daily  ? apixaban  2.5 mg Oral BID  ? arformoterol  15 mcg Nebulization BID  ? budesonide (PULMICORT) n

## 2022-03-07 NOTE — Progress Notes (Signed)
Pt just pulled machine off and states "he's done for the night" pt place back on [place] by RN ?

## 2022-03-07 NOTE — Progress Notes (Signed)
Inpatient Diabetes Program Recommendations ? ?AACE/ADA: New Consensus Statement on Inpatient Glycemic Control (2015) ? ?Target Ranges:  Prepandial:   less than 140 mg/dL ?     Peak postprandial:   less than 180 mg/dL (1-2 hours) ?     Critically ill patients:  140 - 180 mg/dL  ? ?Lab Results  ?Component Value Date  ? GLUCAP 254 (H) 03/07/2022  ? HGBA1C 7.1 (H) 12/24/2021  ? ? ?Review of Glycemic Control ? Latest Reference Range & Units 03/06/22 17:40 03/06/22 20:17 03/07/22 08:43  ?Glucose-Capillary 70 - 99 mg/dL 154 (H) 289 (H) 254 (H)  ?(H): Data is abnormally high ? ?Diabetes history: DM2 ?Outpatient Diabetes medications: Lantus 35 units qd, Nov 7 units tid ?Current orders for Inpatient glycemic control: Levemir 9 units bid, Novolog 0-15 units tid, Solumedrol 120mg   bid ? ?Inpatient Diabetes Program Recommendations:   ?Please consider: ?-Increase Levemir to 12 units bid ?-Add Novolog 3 units tid meal coverage if eats 50% ?Secure chat sent to Dr. Carles Collet. ? ?Thank you, ?Nani Gasser Keyri Salberg, RN, MSN, CDE  ?Diabetes Coordinator ?Inpatient Glycemic Control Team ?Team Pager [phone] (8am-5pm) ?03/07/2022 11:48 AM ? ? ? ? ?

## 2022-03-08 ENCOUNTER — Inpatient Hospital Stay (HOSPITAL_COMMUNITY): Payer: Medicare Other

## 2022-03-08 ENCOUNTER — Other Ambulatory Visit (HOSPITAL_COMMUNITY): Payer: Self-pay | Admitting: *Deleted

## 2022-03-08 DIAGNOSIS — R338 Other retention of urine: Secondary | ICD-10-CM | POA: Diagnosis not present

## 2022-03-08 DIAGNOSIS — I5021 Acute systolic (congestive) heart failure: Secondary | ICD-10-CM | POA: Diagnosis not present

## 2022-03-08 DIAGNOSIS — N184 Chronic kidney disease, stage 4 (severe): Secondary | ICD-10-CM | POA: Diagnosis not present

## 2022-03-08 DIAGNOSIS — Z7189 Other specified counseling: Secondary | ICD-10-CM | POA: Diagnosis not present

## 2022-03-08 DIAGNOSIS — J9621 Acute and chronic respiratory failure with hypoxia: Secondary | ICD-10-CM | POA: Diagnosis not present

## 2022-03-08 DIAGNOSIS — Z515 Encounter for palliative care: Secondary | ICD-10-CM

## 2022-03-08 DIAGNOSIS — I739 Peripheral vascular disease, unspecified: Secondary | ICD-10-CM

## 2022-03-08 DIAGNOSIS — I5043 Acute on chronic combined systolic (congestive) and diastolic (congestive) heart failure: Secondary | ICD-10-CM | POA: Diagnosis not present

## 2022-03-08 DIAGNOSIS — I5023 Acute on chronic systolic (congestive) heart failure: Secondary | ICD-10-CM | POA: Diagnosis not present

## 2022-03-08 DIAGNOSIS — F419 Anxiety disorder, unspecified: Secondary | ICD-10-CM

## 2022-03-08 LAB — URINALYSIS, COMPLETE (UACMP) WITH MICROSCOPIC
Bilirubin Urine: NEGATIVE
Glucose, UA: 150 mg/dL — AB
Hgb urine dipstick: NEGATIVE
Ketones, ur: NEGATIVE mg/dL
Leukocytes,Ua: NEGATIVE
Nitrite: NEGATIVE
Protein, ur: NEGATIVE mg/dL
Specific Gravity, Urine: 1.011 (ref 1.005–1.030)
pH: 6 (ref 5.0–8.0)

## 2022-03-08 LAB — BASIC METABOLIC PANEL
Anion gap: 10 (ref 5–15)
BUN: 62 mg/dL — ABNORMAL HIGH (ref 8–23)
CO2: 28 mmol/L (ref 22–32)
Calcium: 8.6 mg/dL — ABNORMAL LOW (ref 8.9–10.3)
Chloride: 95 mmol/L — ABNORMAL LOW (ref 98–111)
Creatinine, Ser: 2.48 mg/dL — ABNORMAL HIGH (ref 0.61–1.24)
GFR, Estimated: 25 mL/min — ABNORMAL LOW (ref >60)
Glucose, Bld: 345 mg/dL — ABNORMAL HIGH (ref 70–99)
Potassium: 4.4 mmol/L (ref 3.5–5.1)
Sodium: 133 mmol/L — ABNORMAL LOW (ref 135–145)

## 2022-03-08 LAB — GLUCOSE, CAPILLARY
Glucose-Capillary: 313 mg/dL — ABNORMAL HIGH (ref 70–99)
Glucose-Capillary: 431 mg/dL — ABNORMAL HIGH (ref 70–99)
Glucose-Capillary: 314 mg/dL — ABNORMAL HIGH (ref 70–99)
Glucose-Capillary: 237 mg/dL — ABNORMAL HIGH (ref 70–99)

## 2022-03-08 LAB — ECHOCARDIOGRAM LIMITED
Height: 67 in
S' Lateral: 4.8 cm
Weight: 3012.37 oz

## 2022-03-08 LAB — PROCALCITONIN: Procalcitonin: <0.1 ng/mL

## 2022-03-08 LAB — MAGNESIUM: Magnesium: 2.1 mg/dL (ref 1.7–2.4)

## 2022-03-08 MED ORDER — METHYLPREDNISOLONE SODIUM SUCC 125 MG IJ SOLR
60.0000 mg | Freq: Two times a day (BID) | INTRAMUSCULAR | Status: DC
  Administered 2022-03-08 – 2022-03-09 (×3): 60 mg via INTRAVENOUS
  Filled 2022-03-08 (×3): qty 2

## 2022-03-08 MED ORDER — FUROSEMIDE 10 MG/ML IJ SOLN
80.0000 mg | Freq: Two times a day (BID) | INTRAMUSCULAR | Status: DC
  Administered 2022-03-08 – 2022-03-13 (×10): 80 mg via INTRAVENOUS
  Filled 2022-03-08 (×10): qty 8

## 2022-03-08 MED ORDER — PERFLUTREN LIPID MICROSPHERE
1.0000 mL | INTRAVENOUS | Status: AC | PRN
  Administered 2022-03-08: 5 mL via INTRAVENOUS

## 2022-03-08 MED ORDER — INSULIN ASPART 100 UNIT/ML IJ SOLN
25.0000 [IU] | Freq: Once | INTRAMUSCULAR | Status: AC
  Administered 2022-03-08: 25 [IU] via SUBCUTANEOUS

## 2022-03-08 MED ORDER — INSULIN DETEMIR 100 UNIT/ML ~~LOC~~ SOLN
15.0000 [IU] | Freq: Two times a day (BID) | SUBCUTANEOUS | Status: DC
  Administered 2022-03-08 – 2022-03-13 (×11): 15 [IU] via SUBCUTANEOUS
  Filled 2022-03-08 (×2): qty 0.15
  Filled 2022-03-08: qty 1
  Filled 2022-03-08 (×13): qty 0.15

## 2022-03-08 MED ORDER — LIVING BETTER WITH HEART FAILURE BOOK: Freq: Once | Status: AC

## 2022-03-08 MED ORDER — INSULIN ASPART 100 UNIT/ML IJ SOLN
5.0000 [IU] | Freq: Three times a day (TID) | INTRAMUSCULAR | Status: DC
  Administered 2022-03-08 – 2022-03-09 (×6): 5 [IU] via SUBCUTANEOUS

## 2022-03-08 NOTE — Assessment & Plan Note (Signed)
--  encouraged patient to ask for home xanax

## 2022-03-08 NOTE — Consult Note (Signed)
Palliative Care Consult Note                                  Date: 03/08/2022   Patient Name: John [name]  DOB: [birth year]  MRN: [id number]  Age / Sex: 81 y.o., male  PCP: John Ferretti, DO Referring Physician: Catarina Hartshorn, MD  Reason for Consultation: Establishing goals of care  HPI/Patient Profile: 81 y.o. male  with past medical history of recent NSTEMI 12/2021, diabetes mellitus type 2, hyperlipidemia, hypertension, paroxysmal atrial fibrillation, GERD, BPH, chronic respiratory failure on 4 L, and CKD stage 4 presenting with shortness of breath for about 1 week.  In the emergency department he was found to have respiratory distress and placed on BiPAP after initial trial of 15 L per nasal cannula was ineffective.  Has been taking increased diuretics recently as well.  Found to be in acute heart failure exacerbation, chest x-ray noting pulmonary edema and EKG was sinus rhythm with bigeminy.  He was admitted on 03/06/2022 with acute on chronic respiratory failure with hypoxia, COPD exacerbation, acute on chronic combined systolic and diastolic CHF, and others.  He was started on IV steroids, IV furosemide, and bronchodilators.  Cardiology was consulted to assist with management.  PMT was consulted for goals of care.  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  I met  with the patient at the bedside.  No family was present.   Concept of Palliative Care was introduced as specialized medical care for people and their families living with serious illness.  If focuses on providing relief from the symptoms and stress of a serious illness.  The goal is to improve quality of life for both the patient and the family. Values and goals of care important to patient and family were attempted to be elicited.  Created space and opportunity for patient  and family to explore thoughts and feelings regarding current medical situation   Natural trajectory and current clinical status were discussed. Questions and concerns addressed. Patient  encouraged to call with questions or concerns.    Patient/Family Understanding of Illness: He understands he has a bad heart, bad kidneys.  He also described that he has COPD.  We discussed how each of these have been interplay amongst each other with his poor heart exacerbating his bad kidneys which results in increased fluid retention and can exacerbate his heart issues.  His heart failure, with exacerbation and fluid retention, affect his breathing which is already affected by his COPD.  Life Review: He worked at the [employer] for 30 years in [place], where he is from.  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  He retired at age 26.  His primary source of happiness is his family.  He also enjoys TV and travel.  Patient Values: Family, faith  Goals: He states he was previously a DNR but sometime ago he changed his mind and wanted to be a full code.  He continues to want this currently.  His goal is to get better and get home.  Today's Discussion: In addition to the discussion described above around his clinical conditions and life review we had further substantial conversations on various topics.  We explored CODE STATUS in light of his multiple chronic comorbidities.  He is fully aware of the precarious situation  that his heart failure, COPD, chronic kidney disease placing that.  He understands he is very sick individual.  However, he continues to want to be a full code.  I ensured that he understands what this means for him, the success rate of cardiopulmonary resuscitation especially in people with multiple chronic illnesses.  He verbalized understanding.  He states he is currently still using occasional BiPAP.  He lives with his daughter John [name].  He also has a son John [name] who he would like to be his surrogate and notes that he is his healthcare power of attorney.  I provided emotional general support through therapeutic listening, empathy, sharing stories, and other techniques.  I answered all questions addressed all concerns best my ability.  Review of Systems  Constitutional:  Positive for fatigue.  Respiratory:  Positive for shortness of breath (Improved; substantially worsens ith any activity). Negative for chest tightness.   Cardiovascular:  Negative for chest pain.  Gastrointestinal:  Negative for abdominal pain, nausea and vomiting.   Objective:   Primary Diagnoses: Present on Admission:  Acute on chronic respiratory failure with hypoxia (HCC)  CAD (coronary artery disease) of artery bypass graft  Mixed hyperlipidemia  Paroxysmal atrial fibrillation (HCC)  Type 2 diabetes mellitus with hyperglycemia (HCC)  BPH (benign prostatic hyperplasia)  Acute on chronic combined systolic and diastolic CHF (congestive heart failure) (HCC)  COPD exacerbation (HCC)  Essential hypertension  Peripheral vascular disease ([place])   Physical Exam Vitals and nursing note reviewed.  Constitutional:      General: He is not in acute distress.    Appearance: He is obese. He is ill-appearing.  HENT:     Head: Normocephalic and atraumatic.  Cardiovascular:     Rate and Rhythm: Normal rate.  Pulmonary:     Effort: Pulmonary effort is normal. No respiratory distress.     Breath sounds: Decreased breath sounds  present. No wheezing.  Skin:    General: Skin is warm and dry.  Neurological:     General: No focal deficit present.     Mental Status: He is alert.  Psychiatric:        Mood and Affect: Mood normal.        Behavior: Behavior normal.    Vital Signs:  BP (!) 174/67   [name] 93   Temp (!) 96.9 F (36.1 C) (Axillary)   Resp (!) 27   Ht $R'5\' 7"'Ai$  (1.702 m)   Wt 85.4 kg   SpO2 93%   BMI 29.49 kg/m   Palliative Assessment/Data: 30-40%    Advanced Care Planning:   Primary Decision Maker: PATIENT  Code Status/Advance Care Planning: Full code  A discussion was had today regarding advanced directives. Concepts specific to code status, artifical feeding and hydration, continued IV antibiotics and rehospitalization was had.  The difference between a aggressive medical intervention path and a palliative comfort care path for this patient at this time was had. He  continues to want full scope of available treatment.  Decisions/Changes to ACP: None today  Assessment & Plan:   Impression: 81 year old male with multiple comorbidities including systolic and diastolic heart failure with current exacerbation, COPD, chronic kidney disease.  Cardiology is on board and they do not feel there is anything that they can offer to improve his heart.  He is very well aware of how sick he is.  He knows that these are chronic comorbidities affect each other and exacerbating each other.  He knows there is not much that cardiology can do.  He continues to want full code, full scope of treatment.  We are in agreement to take 1 day at a time and make further decisions as necessary based on the evolution of his clinical status.  SUMMARY OF RECOMMENDATIONS   Continue Full Code Continue full scope of care Further discussions as needed based on evolution of clinical picture Remains at high risk for clinical decompinsation PMT will follow-up next week  Symptom Management:  Per primary team PMT is available  to assist as needed  Prognosis:  Unable to determine  Discharge Planning:  To Be Determined   Discussed with: Medical team, nursing team, patient    Thank you for allowing [place] to participate in the care of John [name] PMT will continue to support holistically.  Time Total: 95 min  Greater than 50%  of this time was spent counseling and coordinating care related to the above assessment and plan.  Signed by: Walden Field, NP Palliative Medicine Team  Team Phone # [phone] (Nights/Weekends)  03/08/2022, 4:20 PM

## 2022-03-08 NOTE — Plan of Care (Signed)
  Problem: Education: Goal: Ability to demonstrate management of disease process will improve Outcome: Progressing Goal: Ability to verbalize understanding of medication therapies will improve Outcome: Progressing Goal: Individualized Educational Video(s) Outcome: Progressing   Problem: Activity: Goal: Capacity to carry out activities will improve Outcome: Progressing   Problem: Cardiac: Goal: Ability to achieve and maintain adequate cardiopulmonary perfusion will improve Outcome: Progressing   Problem: Education: Goal: Knowledge of disease or condition will improve Outcome: Progressing Goal: Knowledge of the prescribed therapeutic regimen will improve Outcome: Progressing Goal: Individualized Educational Video(s) Outcome: Progressing   Problem: Activity: Goal: Ability to tolerate increased activity will improve Outcome: Progressing Goal: Will verbalize the importance of balancing activity with adequate rest periods Outcome: Progressing   Problem: Respiratory: Goal: Ability to maintain a clear airway will improve Outcome: Progressing Goal: Levels of oxygenation will improve Outcome: Progressing Goal: Ability to maintain adequate ventilation will improve Outcome: Progressing   Problem: Education: Goal: Knowledge of General Education information will improve Description Including pain rating scale, medication(s)/side effects and non-pharmacologic comfort measures Outcome: Progressing   Problem: Health Behavior/Discharge Planning: Goal: Ability to manage health-related needs will improve Outcome: Progressing   Problem: Clinical Measurements: Goal: Ability to maintain clinical measurements within normal limits will improve Outcome: Progressing Goal: Will remain free from infection Outcome: Progressing Goal: Diagnostic test results will improve Outcome: Progressing Goal: Respiratory complications will improve Outcome: Progressing Goal: Cardiovascular complication  will be avoided Outcome: Progressing   Problem: Activity: Goal: Risk for activity intolerance will decrease Outcome: Progressing   Problem: Nutrition: Goal: Adequate nutrition will be maintained Outcome: Progressing   Problem: Coping: Goal: Level of anxiety will decrease Outcome: Progressing   Problem: Elimination: Goal: Will not experience complications related to bowel motility Outcome: Progressing Goal: Will not experience complications related to urinary retention Outcome: Progressing   Problem: Pain Managment: Goal: General experience of comfort will improve Outcome: Progressing   Problem: Safety: Goal: Ability to remain free from injury will improve Outcome: Progressing   Problem: Skin Integrity: Goal: Risk for impaired skin integrity will decrease Outcome: Progressing   

## 2022-03-08 NOTE — Progress Notes (Signed)
Pt on BIPAP for the night RT will continue to montior

## 2022-03-08 NOTE — Progress Notes (Addendum)
Progress Note  Patient Name: John [name] Date of Encounter: 03/08/2022  [facility] Cardiologist: Kirk Ruths, MD   Subjective   Breathing improved today but he had a difficult night. Required BiPAP intermittently. No chest pain or palpitations.   Inpatient Medications    Scheduled Meds:  allopurinol  300 mg Oral Daily   apixaban  2.5 mg Oral BID   arformoterol  15 mcg Nebulization BID   budesonide (PULMICORT) nebulizer solution  0.5 mg Nebulization Q12H   chlorhexidine  15 mL Mouth Rinse BID   Chlorhexidine Gluconate Cloth  6 each Topical Daily   clopidogrel  75 mg Oral Daily   gabapentin  300 mg Oral QHS   hydrALAZINE  25 mg Oral BID   insulin aspart  0-15 Units Subcutaneous TID WC   insulin aspart  5 Units Subcutaneous TID WC   insulin detemir  15 Units Subcutaneous BID   ipratropium-albuterol  3 mL Nebulization Q6H   isosorbide dinitrate  20 mg Oral BID   Living Better with Heart Failure Book   Does not apply Once   mouth rinse  15 mL Mouth Rinse q12n4p   methylPREDNISolone (SOLU-MEDROL) injection  60 mg Intravenous Q12H   metoprolol succinate  50 mg Oral Daily   multivitamin with minerals  1 tablet Oral Daily   pantoprazole  40 mg Oral Q breakfast   rosuvastatin  10 mg Oral QPM   sertraline  25 mg Oral QPM   sodium chloride flush  3 mL Intravenous Q12H   tamsulosin  0.4 mg Oral Daily   Continuous Infusions:  sodium chloride     PRN Meds: sodium chloride, acetaminophen **OR** acetaminophen, albuterol, ALPRAZolam, HYDROcodone-acetaminophen, ondansetron **OR** ondansetron (ZOFRAN) IV, perflutren lipid microspheres (DEFINITY) IV suspension, sodium chloride flush   Vital Signs    Vitals:   03/08/22 0600 03/08/22 0800 03/08/22 1000 03/08/22 1111  BP: (!) 159/75 134/84 (!) 145/66   Pulse: 71 80 86   Resp: 18 (!) 28 (!) 25   Temp:    97.8 F (36.6 C)  TempSrc:    Oral  SpO2: 91% 92% 94%   Weight:      Height:        Intake/Output Summary (Last 24  hours) at 03/08/2022 1232 Last data filed at 03/08/2022 0845 Gross per 24 hour  Intake 360 ml  Output 1050 ml  Net -690 ml      03/08/2022    4:35 AM 03/07/2022    5:00 AM 03/06/2022    4:13 PM  Last 3 Weights  Weight (lbs) 188 lb 4.4 oz 186 lb 1.1 oz 185 lb 3 oz  Weight (kg) 85.4 kg 84.4 kg 84 kg      Telemetry    NSR, HR in 70's to 80's. Frequent PVC's and occasional episodes of ventricular bigeminy.  - Personally Reviewed  ECG    No new tracings.   Physical Exam   GEN: Pleasant elderly male currently in no acute distress.   Neck: JVD to jaw line.  Cardiac: RRR with ectopic beats.  Respiratory: Rales along bases bilaterally.  GI: Soft, nontender, appears mildly distended.  MS: No pitting edema; No deformity. Neuro:  Nonfocal  Psych: Normal affect   Labs    High Sensitivity Troponin:  No results for input(s): TROPONINIHS in the last 720 hours.   Chemistry Recent Labs  Lab 03/06/22 1214 03/06/22 2237 03/07/22 0410 03/08/22 0512  NA 139  --  136 133*  K 4.1  --  4.4 4.4  CL 100  --  99 95*  CO2 31  --  29 28  GLUCOSE 208*  --  290* 345*  BUN 45*  --  49* 62*  CREATININE 2.13*  --  2.15* 2.48*  CALCIUM 8.9  --  8.4* 8.6*  MG 1.9 2.1  --  2.1  PROT 6.8  --   --   --   ALBUMIN 3.5  --   --   --   AST 12*  --   --   --   ALT 11  --   --   --   ALKPHOS 61  --   --   --   BILITOT 0.9  --   --   --   GFRNONAA 30*  --  30* 25*  ANIONGAP 8  --  8 10    Lipids No results for input(s): CHOL, TRIG, HDL, LABVLDL, LDLCALC, CHOLHDL in the last 168 hours.  Hematology Recent Labs  Lab 03/06/22 1214  WBC 11.4*  RBC 3.29*  HGB 9.5*  HCT 31.0*  MCV 94.2  MCH 28.9  MCHC 30.6  RDW 17.1*  PLT 182   Thyroid No results for input(s): TSH, FREET4 in the last 168 hours.  BNP Recent Labs  Lab 03/06/22 1214  BNP 1,020.0*    DDimer No results for input(s): DDIMER in the last 168 hours.   Radiology    No results found.  Cardiac Studies   Limited Echo:  01/26/2022 IMPRESSIONS     1. Left ventricular ejection fraction, by estimation, is 30 to 35%. The  left ventricle has moderately decreased function. The left ventricle  demonstrates global hypokinesis. The left ventricular internal cavity size  was mildly to moderately dilated.  There is mild left ventricular hypertrophy.   2. Right ventricular systolic function is normal. The right ventricular  size is normal.   3. The inferior vena cava is dilated in size with >50% respiratory  variability, suggesting right atrial pressure of 8 mmHg.   Comparison(s): The left ventricular function is worsened.   Patient Profile     81 y.o. male with a hx of  CAD (s/p multiple stents with most recent cath in 2013 showing EF of 40% with DESx2 to LAD, NSTEMI in 12/2021 with EF of 35% and medical management recommended given his AKI), HFrEF (EF 40-45% in 08/2021, at 35% in 12/2021, 30-35% in 01/2022), paroxysmal atrial fibrillation, Type 2 DM, HTN, HLD, PAD (s/p right fem-pop bypass at [facility] in 05/2018), Stage 3-4 CKD, COPD, OSA and prior CVA who is currently admitted for acute hypoxic respiratory failure. Cardiology consulted for CHF.   Assessment & Plan    1. Acute on Chronic Hypoxic Respiratory Failure - Multifactorial in the setting of acute COPD and CHF exacerbations. Management of CHF as outlined below and COPD is being managed by the admitting team.    2. Acute HFrEF - Presented with volume overload and had failed titration of diuretic therapy as an outpatient but was also having issues with urinary retention.  - BNP elevated to 1020 on admission and CXR consistent with CHF.  - He received IV Lasix 60mg  in AM/80mg  in PM yesterday and creatinine is elevated to 2.48 today. Received 80mg  already this AM so will hold his evening dose. Pending repeat BMET, can try 60mg  BID tomorrow. May ultimately havr to tolerate a higher creatinine to appropriately diurese him. ReDS vest still elevated and at 50.  Unsure about accuracy of weight  as this is listed as having increased by 3 lbs from 185 to 188 lbs yet he has a recorded net output of -2.3 L. Unclear baseline weight given his variable PO intake but was at 184 lbs at the time of his last hospital discharge.  - Continue Toprol-XL, Hydralazine and Isordil for his cardiomyopathy. No ACE-I/ARB/ARNI/SGLT2 inhibitor given his renal function.    3. CAD - He is s/p multiple stents with most recent cath in 2013 showing EF of 40% with DESx2 to LAD. He did have an NSTEMI in 12/2021 with EF of 35% and medical management recommended given his AKI. - He has baseline dyspnea on exertion but no recent chest pain. Not a candidate for repeat cath at this time given his CKD. - Continue medical therapy with Plavix, Crestor, Isordil and Toprol-XL.    4. Paroxysmal Atrial Fibrillation  - No reported palpitations and he is in NSR with frequent PVC's. K+ at 4.4 today and Mg is pending. Continue Toprol-XL 50mg  daily for rate-control and Eliquis 2.5mg  BID for anticoagulation.    5. HLD - Continue Crestor 10mg  daily. LDL was at 29 in 12/2021.  6. Stage 3-4 CKD - His creatinine was at 2.13 on admission which is close to his baseline. Trending up to 2.48 today. Will adjust diuretic therapy as outlined above.    For questions or updates, please contact [place] Please consult [email or web address] for contact info under        Signed, Erma Heritage, PA-C  03/08/2022, 12:32 PM    Patient seen and examined    I agree with findings as noted by B Strader above     Pt says he his having a hard time breathing at this moment    ON exam NEck  JVP is increased  Lungs with decreased airflow   Rales at R base  Cardiac exam:   RRR  No S3   No murmurs  Abd is supple  Ext are without edema     Cr 2.48 today   Acute on chronic HFrEF  Limited echo today confirms LVEF is severely depressed   ReDS reading 41 today   Would continue IV diuresis   Strict I/O   Has  diuresed somce since admit    IF tapers may need inotropic  support    Reassess in AM    Consider renal consult given dysfunction  Dorris Carnes MD

## 2022-03-08 NOTE — Progress Notes (Signed)
PROGRESS NOTE  John [name] UYQ:[id number] DOB: August 29, 1941 DOA: 03/06/2022 PCP: [name], DO  Brief History:  81 year old male with a history of recent NSTEMI 12/2021, diabetes mellitus type 2, hyperlipidemia, hypertension, paroxysmal atrial fibrillation, GERD, BPH, chronic respiratory failure on 4 L, and CKD stage 4 presenting with shortness of breath for about 1 week.  He stated that his legs have not been significantly out more swollen but had been having increasing abdominal girth and orthopnea type symptoms.  He quit smoking over 40 years ago.  Upon EMS arrival, the patient was noted to have some respiratory distress and placed on 15 L [place].  He was placed on BiPAP in the emergency department.  He states that he had been taking double his usual dosage of torsemide for the past 3 days prior to admission without improvement.  He normally takes torsemide 40 mg daily. Notably, the patient had a recent hospital admission from 01/26/2022 to 02/05/2022 for acute on chronic respiratory failure due to CHF exacerbation and COPD exacerbation.  His discharge weight was 184 pounds.  He was discharged home with torsemide 40 mg daily. He was also recently admitted to the hospital from 01/08/2022 to 01/10/2022 for acute on chronic systolic and diastolic CHF.  He was discharged home with torsemide 20 mg daily. In the ED, the patient was afebrile and hemodynamically stable with oxygen saturation 98% on BiPAP.  BMP showed sodium 134 and serum creatinine 2.13.  WBC 11.4, hemoglobin 9.5, platelets 282,000.  LFTs were unremarkable.  Chest x-ray showed pulmonary edema.  EKG shows sinus rhythm with bigeminy.  The patient was started on IV steroids, IV furosemide, and bronchodilators.  Cardiology was consulted to assist with management.     Assessment and Plan: * Acute on chronic respiratory failure with hypoxia (HCC) -due to COPD and CHF exacerbation. -chronically on 4L [place] -experiencing increased work of  breathing and borderline saturation while using 15 L of oxygen.  He has been placed on BiPAP and admitted to stepdown bed. -IV diuresis has been started; will continue the use of isosorbide and metoprolol.  -Given chronic renal failure not a candidate for ACE or ARB regimen. -Cardiology consulted -ReDS protocol requested -Daily weights, low-sodium diet, strict intake and output, follow clinical response. -personally reviewed chest x-ray with interstitial edema  COPD exacerbation ([place]) Continue Pulmicort and Brovana Continue DuoNebs Continue IV Solu-Medrol Continues to require intermittent BiPAP  Acute on chronic combined systolic and diastolic CHF (congestive heart failure) ([place]) 12/25/21 echo EF 35%, global HK, grade 2 DD 01/26/22 Echo  EF 30-35%, global HK Appreciate cardiology consult Continue IV furosemide to 80 mg bid Daily weights--not accurate Accurate I's and O's-=-incomplete 01/30/22 High Res CT chest>>Persistent bilateral bronchial wall thickening and interlobularseptal thickening, likely due to pulmonary edema. --obtain ReDS reading today --limited echo to r/o pericardial effusion as pt states sob no better  CKD (chronic kidney disease) stage 4, GFR 15-29 ml/min (HCC) Baseline creatinine 2.1-2.4 Will need to tolerate increase in serum creatinine for euvolemia  Paroxysmal atrial fibrillation (HCC) -Continue apixaban Continue metoprolol succinate Currently in sinus rhythm   Anxiety --encouraged patient to ask for home xanax  Acute urinary retention Patient had difficulty during last hospitalization requiring Foley catheter Check bladder scan Place Foley if continues to have retention  BPH (benign prostatic hyperplasia) -Continue Flomax.  Type 2 diabetes mellitus with hyperglycemia (HCC) - Recent A1c (December 24, 2021) 7.1 -Holding oral hypoglycemic agents while inpatient -increase levemir  to 15 units bid -increase premeal insulin to 5 units -Anticipating elevated  CBGs/fluctuation with the use of his steroids  Essential hypertension Continue metoprolol succinate Hydralazine added -not a candidate for ACEi or ARB due to CKD  Peripheral vascular disease ([place]) - He is s/p right fem-pop bypass at [facility] in 05/2018. Remains on Plavix and statin therapy.     Mixed hyperlipidemia - Continue Crestor.  CAD (coronary artery disease) of artery bypass graft - No chest pain -No acute ischemic changes appreciated on EKG -Continue the use of beta-blocker, Imdur, Plavix and statin.    Family Communication:   daughter updated at bedside 5/17   Consultants:  cardiology   Code Status:  FULL    DVT Prophylaxis:  apixaban     Procedures: As Listed in Progress Note Above   Antibiotics: None        Subjective: Patient states sob only slightly better.  Complains of sob with minimal exertion.  Denies f/c, cp, n/v/d, hemoptysis.  Objective: Vitals:   03/08/22 0200 03/08/22 0400 03/08/22 0435 03/08/22 0600  BP: 138/61 (!) 150/82  (!) 159/75  Pulse: 76 80  71  Resp: (!) 23 (!) 26  18  Temp:  97.9 F (36.6 C)    TempSrc:  Oral    SpO2: 94% 90%  91%  Weight:   85.4 kg   Height:        Intake/Output Summary (Last 24 hours) at 03/08/2022 0737 Last data filed at 03/08/2022 0600 Gross per 24 hour  Intake 240 ml  Output 1250 ml  Net -1010 ml   Weight change: 2.4 kg Exam:  General:  Pt is alert, follows commands appropriately, not in acute distress HEENT: No icterus, No thrush, No neck mass, [place]/AT Cardiovascular: RRR, S1/S2, no rubs, no gallops Respiratory: bibasilar rales.  No wheeze Abdomen: Soft/+BS, non tender, non distended, no guarding Extremities: No edema, No lymphangitis, No petechiae, No rashes, no synovitis   Data Reviewed: I have personally reviewed following labs and imaging studies Basic Metabolic Panel: Recent Labs  Lab 03/06/22 1214 03/06/22 2237 03/07/22 0410 03/08/22 0512  NA 139  --  136 133*  K 4.1  --   4.4 4.4  CL 100  --  99 95*  CO2 31  --  29 28  GLUCOSE 208*  --  290* 345*  BUN 45*  --  49* 62*  CREATININE 2.13*  --  2.15* 2.48*  CALCIUM 8.9  --  8.4* 8.6*  MG 1.9 2.1  --  2.1  PHOS 3.8  --   --   --    Liver Function Tests: Recent Labs  Lab 03/06/22 1214  AST 12*  ALT 11  ALKPHOS 61  BILITOT 0.9  PROT 6.8  ALBUMIN 3.5   No results for input(s): LIPASE, AMYLASE in the last 168 hours. No results for input(s): AMMONIA in the last 168 hours. Coagulation Profile: No results for input(s): INR, PROTIME in the last 168 hours. CBC: Recent Labs  Lab 03/06/22 1214  WBC 11.4*  NEUTROABS 9.9*  HGB 9.5*  HCT 31.0*  MCV 94.2  PLT 182   Cardiac Enzymes: No results for input(s): CKTOTAL, CKMB, CKMBINDEX, TROPONINI in the last 168 hours. BNP: Invalid input(s): POCBNP CBG: Recent Labs  Lab 03/06/22 2017 03/07/22 0843 03/07/22 1154 03/07/22 1643 03/07/22 2041  GLUCAP 289* 254* 243* 270* 306*   HbA1C: No results for input(s): HGBA1C in the last 72 hours. Urine analysis:    Component Value  Date/Time   COLORURINE YELLOW 01/26/2022 2100   APPEARANCEUR CLEAR 01/26/2022 2100   LABSPEC 1.011 01/26/2022 2100   PHURINE 5.0 01/26/2022 2100   GLUCOSEU 150 (A) 01/26/2022 2100   HGBUR NEGATIVE 01/26/2022 2100   BILIRUBINUR NEGATIVE 01/26/2022 2100   [place] NEGATIVE 01/26/2022 2100   PROTEINUR NEGATIVE 01/26/2022 2100   NITRITE NEGATIVE 01/26/2022 2100   LEUKOCYTESUR NEGATIVE 01/26/2022 2100   Sepsis Labs: @LABRCNTIP (procalcitonin:4,lacticidven:4) ) Recent Results (from the past 240 hour(s))  MRSA Next Gen by PCR, Nasal     Status: None   Collection Time: 03/06/22  7:13 PM   Specimen: Nasal Mucosa; Nasal Swab  Result Value Ref Range Status   MRSA by PCR Next Gen NOT DETECTED NOT DETECTED Final    Comment: (NOTE) The GeneXpert MRSA Assay (FDA approved for NASAL specimens only), is one component of a comprehensive MRSA colonization surveillance program. It is not  intended to diagnose MRSA infection nor to guide or monitor treatment for MRSA infections. Test performance is not FDA approved in patients less than 43 years old. Performed at [facility], [address], [place], [place] [address]      Scheduled Meds:  allopurinol  300 mg Oral Daily   apixaban  2.5 mg Oral BID   arformoterol  15 mcg Nebulization BID   budesonide (PULMICORT) nebulizer solution  0.5 mg Nebulization Q12H   chlorhexidine  15 mL Mouth Rinse BID   Chlorhexidine Gluconate Cloth  6 each Topical Daily   clopidogrel  75 mg Oral Daily   furosemide  80 mg Intravenous Q12H   gabapentin  300 mg Oral QHS   hydrALAZINE  25 mg Oral BID   insulin aspart  0-15 Units Subcutaneous TID WC   insulin aspart  5 Units Subcutaneous TID WC   insulin detemir  15 Units Subcutaneous BID   ipratropium-albuterol  3 mL Nebulization Q6H   isosorbide dinitrate  20 mg Oral BID   mouth rinse  15 mL Mouth Rinse q12n4p   methylPREDNISolone (SOLU-MEDROL) injection  60 mg Intravenous Q12H   metoprolol succinate  50 mg Oral Daily   multivitamin with minerals  1 tablet Oral Daily   pantoprazole  40 mg Oral Q breakfast   rosuvastatin  10 mg Oral QPM   sertraline  25 mg Oral QPM   sodium chloride flush  3 mL Intravenous Q12H   tamsulosin  0.4 mg Oral Daily   Continuous Infusions:  sodium chloride      Procedures/Studies: DG Chest Port 1 View  Result Date: 03/06/2022 CLINICAL DATA:  Shortness of breath. EXAM: PORTABLE CHEST 1 VIEW COMPARISON:  February 01, 2022. FINDINGS: Stable cardiomegaly. Bilateral pulmonary edema is noted. Bibasilar atelectasis is noted with small right pleural effusion. Bony thorax is unremarkable. IMPRESSION: Bilateral pulmonary edema and bibasilar atelectasis is noted with small right pleural effusion. Electronically Signed   By: Marijo Conception M.D.   On: 03/06/2022 12:32    Orson Eva, DO  [facility]  If 7PM-7AM, please contact night-coverage [email or web address] Password  [facility] 03/08/2022, 7:37 AM   LOS: 2 days

## 2022-03-08 NOTE — TOC Initial Note (Signed)
Transition of Care [facility]) - Initial/Assessment Note    Patient Details  Name: John [name] MRN: [id number] Date of Birth: February 08, 1941  Transition of Care [facility]) CM/SW Contact:    Iona Beard, [place] Phone Number: 03/08/2022, 11:56 AM  Clinical Narrative:                 Pt is high risk for readmission and TOC consulted for CHF screen. CSW met with pt in room to complete assessment. Pt lives with his daughter. Pt is mostly independent in completing his ADLs. Pt is able to drive but if he cannot his daughter will. Pt has a cane and rollator to use when needed. Pt is also on 4L O2 supplied through [employer]. Pt states his daughter is working on get him active with outpatient palliative services.   CSW spoke with pts daughter who states that she is working on this and they were supposed to have an appointment yesterday. CSW inquired about interest in [facility] at D/C. They are agreeable to this and would appreciate it being set up.   Pt states that he weighs daily. Tries to follow a heart healthy diet and takes his medications as prescribed.   Expected Discharge Plan: [place] Barriers to Discharge: Continued Medical Work up   Patient Goals and CMS Choice Patient states their goals for this hospitalization and ongoing recovery are:: reutrn home with [facility] CMS Medicare.gov Compare Post Acute Care list provided to:: Patient Choice offered to / list presented to : Patient, Adult Children  Expected Discharge Plan and Services Expected Discharge Plan: [place] In-house Referral: Clinical Social Work   Post Acute Care Choice: [place] arrangements for the past 2 months: [place]                                      Prior Living Arrangements/Services Living arrangements for the past 2 months: Single Family Home Lives with:: Adult Children Patient language and need for interpreter reviewed:: Yes Do you feel safe going back to the place  where you live?: Yes      Need for Family Participation in Patient Care: Yes (Comment) Care giver support system in place?: Yes (comment) Current home services: DME (cane, rollator, O2) Criminal Activity/Legal Involvement Pertinent to Current Situation/Hospitalization: No - Comment as needed  Activities of Daily Living Home Assistive Devices/Equipment: CBG Meter, Oxygen ADL Screening (condition at time of admission) Patient's cognitive ability adequate to safely complete daily activities?: Yes Is the patient deaf or have difficulty hearing?: No Does the patient have difficulty seeing, even when wearing glasses/contacts?: No Does the patient have difficulty concentrating, remembering, or making decisions?: No Patient able to express need for assistance with ADLs?: Yes Does the patient have difficulty dressing or bathing?: No Independently performs ADLs?: Yes (appropriate for developmental age) Does the patient have difficulty walking or climbing stairs?: Yes Weakness of Legs: None Weakness of Arms/Hands: None  Permission Sought/Granted                  Emotional Assessment Appearance:: Appears stated age Attitude/Demeanor/Rapport: Engaged Affect (typically observed): Accepting Orientation: : Oriented to Self, Oriented to Place, Oriented to  Time, Oriented to Situation Alcohol / Substance Use: Not Applicable Psych Involvement: No (comment)  Admission diagnosis:  Hypoxia [R09.02] Acute on chronic systolic congestive heart failure (HCC) [I50.23] Acute on chronic respiratory failure with  hypoxia ([place]) [J96.21] Chronic obstructive pulmonary disease, unspecified COPD type ([place]) [J44.9] Patient Active Problem List   Diagnosis Date Noted   Anxiety 03/08/2022   Acute on chronic respiratory failure with hypoxia ([place]) 03/06/2022   Pleural effusion, right    Acute urinary retention 01/30/2022   COPD exacerbation ([place]) 01/26/2022   CKD (chronic kidney disease) stage 4, GFR 15-29  ml/min (HCC) 01/26/2022   Paroxysmal atrial fibrillation ([place]) 01/26/2022   Diabetes mellitus, type 2 ([place]) 01/09/2022   D-dimer, elevated 12/24/2021    Class: Acute   NSTEMI (non-ST elevated myocardial infarction) ([place]) 12/24/2021   Acute on chronic combined systolic and diastolic CHF (congestive heart failure) ([place]) 09/13/2021   Type 2 diabetes mellitus with hyperglycemia ([place]) 09/13/2021   Elevated troponin 09/13/2021   Obstructive sleep apnea 09/13/2021   BPH (benign prostatic hyperplasia) 09/13/2021   Diabetic neuropathy ([place]) 09/13/2021   Chest pain 18/29/9371   Acute diastolic CHF (congestive heart failure) ([place]) 12/19/2020   Essential hypertension 10/03/2012   CAD (coronary artery disease) of artery bypass graft 06/26/2012   Cardiomyopathy, 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   Mixed hyperlipidemia 06/26/2012   Peripheral vascular disease ([place]) 06/26/2012   PCP:  [name], DO Pharmacy:   [facility] [address] [address] Phone: [phone] Fax: [phone]  [id number] - [place], [place] - [address] [place] [address] Phone: [phone] Fax: [phone]  [facility] - [place], [place] [address] [place] [address] Phone: [phone] Fax: [phone]  [id number] [place], [place] [address] [place] [address] Phone: [phone] Fax: [phone]     Social Determinants of Health (SDOH) Interventions    Readmission Risk Interventions    03/08/2022   11:55 AM 01/29/2022   10:39 AM 12/29/2021   11:45 AM  Readmission Risk Prevention Plan  Transportation Screening Complete Complete Complete  PCP or Specialist Appt within 3-5 Days   Complete  HRI or [place]   Complete  Social Work Consult for [place] Planning/Counseling   Complete   Palliative Care Screening   Not Applicable  Medication Review Press photographer) Complete Complete Complete  HRI or Home Care Consult Complete Complete   SW Recovery Care/Counseling Consult Complete Complete   Palliative Care Screening Not Applicable Not [place] Not Applicable Not Applicable

## 2022-03-08 NOTE — Progress Notes (Signed)
Pt not on BIPAP machine when RT arrived to do breathing treatment

## 2022-03-08 NOTE — Progress Notes (Signed)
   03/08/22 0803  ReDS Vest / Clip  Station Marker D  Ruler Value 35  ReDS Value Range (!) > 40  ReDS Actual Value 50  Anatomical Comments patient sitting up in chair

## 2022-03-08 NOTE — Progress Notes (Signed)
*  PRELIMINARY RESULTS* Echocardiogram Limited 2D Echocardiogram has been performed.  John [name] 03/08/2022, 11:13 AM

## 2022-03-09 DIAGNOSIS — N184 Chronic kidney disease, stage 4 (severe): Secondary | ICD-10-CM | POA: Diagnosis not present

## 2022-03-09 DIAGNOSIS — I5043 Acute on chronic combined systolic (congestive) and diastolic (congestive) heart failure: Secondary | ICD-10-CM | POA: Diagnosis not present

## 2022-03-09 DIAGNOSIS — J9621 Acute and chronic respiratory failure with hypoxia: Secondary | ICD-10-CM | POA: Diagnosis not present

## 2022-03-09 LAB — BASIC METABOLIC PANEL
Anion gap: 10 (ref 5–15)
BUN: 70 mg/dL — ABNORMAL HIGH (ref 8–23)
CO2: 30 mmol/L (ref 22–32)
Calcium: 9 mg/dL (ref 8.9–10.3)
Chloride: 96 mmol/L — ABNORMAL LOW (ref 98–111)
Creatinine, Ser: 2.32 mg/dL — ABNORMAL HIGH (ref 0.61–1.24)
GFR, Estimated: 27 mL/min — ABNORMAL LOW (ref >60)
Glucose, Bld: 229 mg/dL — ABNORMAL HIGH (ref 70–99)
Potassium: 4.3 mmol/L (ref 3.5–5.1)
Sodium: 136 mmol/L (ref 135–145)

## 2022-03-09 LAB — GLUCOSE, CAPILLARY
Glucose-Capillary: 204 mg/dL — ABNORMAL HIGH (ref 70–99)
Glucose-Capillary: 281 mg/dL — ABNORMAL HIGH (ref 70–99)
Glucose-Capillary: 373 mg/dL — ABNORMAL HIGH (ref 70–99)
Glucose-Capillary: 167 mg/dL — ABNORMAL HIGH (ref 70–99)

## 2022-03-09 LAB — MAGNESIUM: Magnesium: 2.3 mg/dL (ref 1.7–2.4)

## 2022-03-09 MED ORDER — METHYLPREDNISOLONE SODIUM SUCC 125 MG IJ SOLR: 60.0000 mg | INTRAMUSCULAR | Status: DC

## 2022-03-09 MED ORDER — INSULIN ASPART 100 UNIT/ML IJ SOLN
8.0000 [IU] | Freq: Three times a day (TID) | INTRAMUSCULAR | Status: DC
  Administered 2022-03-10 – 2022-03-11 (×5): 8 [IU] via SUBCUTANEOUS

## 2022-03-09 NOTE — Progress Notes (Signed)
Pt off BIPAP asleep and resting no distress noted

## 2022-03-09 NOTE — Plan of Care (Signed)
  Problem: Acute Rehab PT Goals(only PT should resolve) Goal: Pt Will Go Supine/Side To Sit Outcome: Progressing Flowsheets (Taken 03/09/2022 1203) Pt will go Supine/Side to Sit:  with modified independence  with supervision Goal: Patient Will Transfer Sit To/From Stand Outcome: Progressing Flowsheets (Taken 03/09/2022 1203) Patient will transfer sit to/from stand:  with modified independence  with supervision Goal: Pt Will Transfer Bed To Chair/Chair To Bed Outcome: Progressing Flowsheets (Taken 03/09/2022 1203) Pt will Transfer Bed to Chair/Chair to Bed:  with modified independence  with supervision Goal: Pt Will Perform Standing Balance Or Pre-Gait Outcome: Progressing Flowsheets (Taken 03/09/2022 1203) Pt will perform standing balance or pre-gait:  with Supervision  with Modified Independent Goal: Pt Will Ambulate Outcome: Progressing Flowsheets (Taken 03/09/2022 1203) Pt will Ambulate:  50 feet  with rolling walker  with min guard assist  with supervision   12:03 PM, 03/09/22 Lestine Box, S/PT

## 2022-03-09 NOTE — Progress Notes (Signed)
Inpatient Diabetes Program Recommendations  AACE/ADA: New Consensus Statement on Inpatient Glycemic Control (2015)  Target Ranges:  Prepandial:   less than 140 mg/dL      Peak postprandial:   less than 180 mg/dL (1-2 hours)      Critically ill patients:  140 - 180 mg/dL   Lab Results  Component Value Date   GLUCAP 237 (H) 03/08/2022   HGBA1C 7.1 (H) 12/24/2021    Review of Glycemic Control  Latest Reference Range & Units 03/08/22 08:06 03/08/22 11:08 03/08/22 15:48 03/08/22 20:39  Glucose-Capillary 70 - 99 mg/dL 313 (H) 431 (H) 314 (H) 237 (H)  (H): Data is abnormally high  Diabetes history: DM2 Outpatient Diabetes medications: Lantus 35 units qd, Nov 7 units tid Current orders for Inpatient glycemic control: Levemir 15 units bid, Novolog 0-15 units tid, Novolog 5 units TID, Solumedrol 60mg   bid  Inpatient Diabetes Program Recommendations:    Levemir 20 units BID  Will continue to follow while inpatient.  Thank you, Reche Dixon, MSN, [place] Diabetes Coordinator Inpatient Diabetes Program [phone] (team pager from 8a-5p)

## 2022-03-09 NOTE — Progress Notes (Signed)
PROGRESS NOTE  John [name] YFV:[id number] DOB: 1941-10-12 DOA: 03/06/2022 PCP: [name], DO  Brief History:  81 year old male with a history of recent NSTEMI 12/2021, diabetes mellitus type 2, hyperlipidemia, hypertension, paroxysmal atrial fibrillation, GERD, BPH, chronic respiratory failure on 4 L, and CKD stage 4 presenting with shortness of breath for about 1 week.  He stated that his legs have not been significantly out more swollen but had been having increasing abdominal girth and orthopnea type symptoms.  He quit smoking over 40 years ago.  Upon EMS arrival, the patient was noted to have some respiratory distress and placed on 15 L [place].  He was placed on BiPAP in the emergency department.  He states that he had been taking double his usual dosage of torsemide for the past 3 days prior to admission without improvement.  He normally takes torsemide 40 mg daily. Notably, the patient had a recent hospital admission from 01/26/2022 to 02/05/2022 for acute on chronic respiratory failure due to CHF exacerbation and COPD exacerbation.  His discharge weight was 184 pounds.  He was discharged home with torsemide 40 mg daily. He was also recently admitted to the hospital from 01/08/2022 to 01/10/2022 for acute on chronic systolic and diastolic CHF.  He was discharged home with torsemide 20 mg daily. In the ED, the patient was afebrile and hemodynamically stable with oxygen saturation 98% on BiPAP.  BMP showed sodium 134 and serum creatinine 2.13.  WBC 11.4, hemoglobin 9.5, platelets 282,000.  LFTs were unremarkable.  Chest x-ray showed pulmonary edema.  EKG shows sinus rhythm with bigeminy.  The patient was started on IV steroids, IV furosemide, and bronchodilators.  Cardiology was consulted to assist with management.     Assessment and Plan: * Acute on chronic respiratory failure with hypoxia (HCC) -due to COPD and CHF exacerbation. -chronically on 4L [place] -experiencing increased work of  breathing and borderline saturation while using 15 L of oxygen.  He has been placed on BiPAP and admitted to stepdown bed. -IV diuresis has been started; will continue the use of isosorbide and metoprolol.  -Given chronic renal failure not a candidate for ACE or ARB regimen. -Cardiology consulted -ReDS protocol requested -Daily weights, low-sodium diet, strict intake and output, follow clinical response. -personally reviewed chest x-ray with interstitial edema  COPD exacerbation ([place]) Continue Pulmicort and Brovana Continue DuoNebs Continue IV Solu-Medrol>>decrease to once daily Continues to require intermittent BiPAP  Acute on chronic combined systolic and diastolic CHF (congestive heart failure) ([place]) 12/25/21 echo EF 35%, global HK, grade 2 DD 01/26/22 Echo  EF 30-35%, global HK Appreciate cardiology consult Continue IV furosemide to 80 mg bid Daily weights--not accurate Accurate I's and O's-=-incomplete 01/30/22 High Res CT chest>>Persistent bilateral bronchial wall thickening and interlobularseptal thickening, likely due to pulmonary edema. --obtain ReDS reading 5/18--50% --limited echo--EF 25-30%; trivial pericardial eff  CKD (chronic kidney disease) stage 4, GFR 15-29 ml/min (HCC) Baseline creatinine 2.1-2.4 Will need to tolerate increase in serum creatinine for euvolemia  Paroxysmal atrial fibrillation (HCC) -Continue apixaban Continue metoprolol succinate Currently in sinus rhythm   Anxiety --encouraged patient to ask for home xanax  Acute urinary retention Patient had difficulty during last hospitalization requiring Foley catheter Check bladder scan Place Foley if continues to have retention  BPH (benign prostatic hyperplasia) -Continue Flomax.  Type 2 diabetes mellitus with hyperglycemia (HCC) - Recent A1c (December 24, 2021) 7.1 -Holding oral hypoglycemic agents while inpatient -increase levemir to 15 units  bid -increase premeal insulin to 5 units -Anticipating  elevated CBGs/fluctuation with the use of his steroids  Essential hypertension Continue metoprolol succinate Hydralazine added -not a candidate for ACEi or ARB due to CKD  Peripheral vascular disease ([place]) - He is s/p right fem-pop bypass at [facility] in 05/2018. Remains on Plavix and statin therapy.     Mixed hyperlipidemia - Continue Crestor.  CAD (coronary artery disease) of artery bypass graft - No chest pain -No acute ischemic changes appreciated on EKG -Continue the use of beta-blocker, Imdur, Plavix and statin.    Family Communication:   daughter updated at bedside 5/17   Consultants:  cardiology   Code Status:  FULL    DVT Prophylaxis:  apixaban     Procedures: As Listed in Progress Note Above   Antibiotics: None            Subjective: He is breathing a little better today.  Denies cp, n/v/d, abd pain.  No f/c  Objective: Vitals:   03/09/22 0900 03/09/22 1500 03/09/22 1533 03/09/22 1600  BP: 139/64 (!) 162/74  (!) 148/93  Pulse: 71 85  82  Resp: (!) 23 (!) 21  20  Temp:   97.8 F (36.6 C)   TempSrc:   Oral   SpO2: 98% 96%  94%  Weight:      Height:        Intake/Output Summary (Last 24 hours) at 03/09/2022 1656 Last data filed at 03/09/2022 1500 Gross per 24 hour  Intake 240 ml  Output 1325 ml  Net -1085 ml   Weight change: -1.8 kg Exam:  General:  Pt is alert, follows commands appropriately, not in acute distress HEENT: No icterus, No thrush, No neck mass, [place]/AT Cardiovascular: RRR, S1/S2, no rubs, no gallops Respiratory: bibasilar  crackles. No wheeze Abdomen: Soft/+BS, non tender, non distended, no guarding Extremities: No edema, No lymphangitis, No petechiae, No rashes, no synovitis   Data Reviewed: I have personally reviewed following labs and imaging studies Basic Metabolic Panel: Recent Labs  Lab 03/06/22 1214 03/06/22 2237 03/07/22 0410 03/08/22 0512 03/09/22 0352  NA 139  --  136 133* 136  K 4.1  --  4.4 4.4 4.3   CL 100  --  99 95* 96*  CO2 31  --  29 28 30   GLUCOSE 208*  --  290* 345* 229*  BUN 45*  --  49* 62* 70*  CREATININE 2.13*  --  2.15* 2.48* 2.32*  CALCIUM 8.9  --  8.4* 8.6* 9.0  MG 1.9 2.1  --  2.1 2.3  PHOS 3.8  --   --   --   --    Liver Function Tests: Recent Labs  Lab 03/06/22 1214  AST 12*  ALT 11  ALKPHOS 61  BILITOT 0.9  PROT 6.8  ALBUMIN 3.5   No results for input(s): LIPASE, AMYLASE in the last 168 hours. No results for input(s): AMMONIA in the last 168 hours. Coagulation Profile: No results for input(s): INR, PROTIME in the last 168 hours. CBC: Recent Labs  Lab 03/06/22 1214  WBC 11.4*  NEUTROABS 9.9*  HGB 9.5*  HCT 31.0*  MCV 94.2  PLT 182   Cardiac Enzymes: No results for input(s): CKTOTAL, CKMB, CKMBINDEX, TROPONINI in the last 168 hours. BNP: Invalid input(s): POCBNP CBG: Recent Labs  Lab 03/08/22 1548 03/08/22 2039 03/09/22 0801 03/09/22 1203 03/09/22 1526  GLUCAP 314* 237* 204* 281* 373*   HbA1C: No results for input(s): HGBA1C in  the last 72 hours. Urine analysis:    Component Value Date/Time   COLORURINE YELLOW 03/08/2022 0728   APPEARANCEUR CLEAR 03/08/2022 0728   LABSPEC 1.011 03/08/2022 0728   PHURINE 6.0 03/08/2022 0728   GLUCOSEU 150 (A) 03/08/2022 0728   HGBUR NEGATIVE 03/08/2022 0728   BILIRUBINUR NEGATIVE 03/08/2022 0728   KETONESUR NEGATIVE 03/08/2022 0728   PROTEINUR NEGATIVE 03/08/2022 0728   NITRITE NEGATIVE 03/08/2022 0728   LEUKOCYTESUR NEGATIVE 03/08/2022 0728   Sepsis Labs: @LABRCNTIP (procalcitonin:4,lacticidven:4) ) Recent Results (from the past 240 hour(s))  MRSA Next Gen by PCR, Nasal     Status: None   Collection Time: 03/06/22  7:13 PM   Specimen: Nasal Mucosa; Nasal Swab  Result Value Ref Range Status   MRSA by PCR Next Gen NOT DETECTED NOT DETECTED Final    Comment: (NOTE) The GeneXpert MRSA Assay (FDA approved for NASAL specimens only), is one component of a comprehensive MRSA colonization  surveillance program. It is not intended to diagnose MRSA infection nor to guide or monitor treatment for MRSA infections. Test performance is not FDA approved in patients less than 76 years old. Performed at [facility], [address] [place], [place] [address]   Urine Culture     Status: Abnormal (Preliminary result)   Collection Time: 03/08/22 11:15 AM   Specimen: Urine, Clean Catch  Result Value Ref Range Status   Specimen Description   Final    URINE, CLEAN CATCH Performed at [facility], [address], [place], [place] [address]    Special Requests   Final    NONE Performed at [facility], [address] [place], [place] [address]    Culture (A)  Final    >=100,000 COLONIES/mL ENTEROCOCCUS FAECALIS SUSCEPTIBILITIES TO FOLLOW Performed at [facility] [address], [place],  [address]    Report Status PENDING  Incomplete     Scheduled Meds:  allopurinol  300 mg Oral Daily   apixaban  2.5 mg Oral BID   arformoterol  15 mcg Nebulization BID   budesonide (PULMICORT) nebulizer solution  0.5 mg Nebulization Q12H   chlorhexidine  15 mL Mouth Rinse BID   Chlorhexidine Gluconate Cloth  6 each Topical Daily   clopidogrel  75 mg Oral Daily   furosemide  80 mg Intravenous BID   gabapentin  300 mg Oral QHS   hydrALAZINE  25 mg Oral BID   insulin aspart  0-15 Units Subcutaneous TID WC   insulin aspart  5 Units Subcutaneous TID WC   insulin detemir  15 Units Subcutaneous BID   ipratropium-albuterol  3 mL Nebulization Q6H   isosorbide dinitrate  20 mg Oral BID   mouth rinse  15 mL Mouth Rinse q12n4p   [START ON 03/10/2022] methylPREDNISolone (SOLU-MEDROL) injection  60 mg Intravenous Q24H   metoprolol succinate  50 mg Oral Daily   multivitamin with minerals  1 tablet Oral Daily   pantoprazole  40 mg Oral Q breakfast   rosuvastatin  10 mg Oral QPM   sertraline  25 mg Oral QPM   sodium chloride flush  3 mL Intravenous Q12H   tamsulosin  0.4 mg Oral Daily    Continuous Infusions:  sodium chloride      Procedures/Studies: DG Chest Port 1 View  Result Date: 03/06/2022 CLINICAL DATA:  Shortness of breath. EXAM: PORTABLE CHEST 1 VIEW COMPARISON:  February 01, 2022. FINDINGS: Stable cardiomegaly. Bilateral pulmonary edema is noted. Bibasilar atelectasis is noted with small right pleural effusion. Bony  thorax is unremarkable. IMPRESSION: Bilateral pulmonary edema and bibasilar atelectasis is noted with small right pleural effusion. Electronically Signed   By: Marijo Conception M.D.   On: 03/06/2022 12:32   ECHOCARDIOGRAM LIMITED  Result Date: 03/08/2022    ECHOCARDIOGRAM LIMITED REPORT   Patient Name:   John [name] Date of Exam: 03/08/2022 Medical Rec #:  [id number]     Height:       67.0 in Accession #:    [id number]    Weight:       188.3 lb Date of Birth:  Feb 17, 1941      BSA:          1.971 m Patient Age:    1 years      BP:           134/84 mmHg Patient Gender: M             HR:           87 bpm. Exam Location:  [facility] Procedure: Limited Echo and Intracardiac Opacification Agent Indications:    CHF  History:        Patient has prior history of Echocardiogram examinations, most                 recent 01/26/2022. CHF, CAD and Previous Myocardial Infarction,                 COPD, Arrythmias:Atrial Fibrillation, Signs/Symptoms:Chest Pain;                 Risk Factors:Hypertension, Diabetes and Dyslipidemia.  Sonographer:    Wenda Low Referring Phys: [phone] Janaysha Depaulo IMPRESSIONS  1. Limited study.  2. Poor acoustic windows.  3. LVEF is severely depressed with hypokinesis worse in the lateral, anterior and apical walls . Left ventricular ejection fraction, by estimation, is 25 to 30%. The left ventricle has severely decreased function. The left ventricular internal cavity size was moderately dilated. There is mild left ventricular hypertrophy.  4. Right ventricular systolic function is normal. The right ventricular size is normal.  5. The inferior vena cava is  dilated in size with <50% respiratory variability, suggesting right atrial pressure of 15 mmHg. FINDINGS  Left Ventricle: LVEF is severely depressed with hypokinesis worse in the lateral, anterior and apical walls. Left ventricular ejection fraction, by estimation, is 25 to 30%. The left ventricle has severely decreased function. Definity contrast agent was  given IV to delineate the left ventricular endocardial borders. The left ventricular internal cavity size was moderately dilated. There is mild left ventricular hypertrophy. Right Ventricle: The right ventricular size is normal. Right vetricular wall thickness was not assessed. Right ventricular systolic function is normal. Pericardium: Trivial pericardial effusion is present. Mitral Valve: There is mild thickening of the mitral valve leaflet(s). Mild mitral annular calcification. Tricuspid Valve: The tricuspid valve is not assessed. Pulmonic Valve: The pulmonic valve was grossly normal. Aorta: The aortic root and ascending aorta are structurally normal, with no evidence of dilitation. Venous: The inferior vena cava is dilated in size with less than 50% respiratory variability, suggesting right atrial pressure of 15 mmHg. LEFT VENTRICLE PLAX 2D LVIDd:         5.50 cm LVIDs:         4.80 cm LV PW:         1.10 cm LV IVS:        1.20 cm LVOT diam:     2.00 cm LVOT Area:     3.14 cm  LEFT ATRIUM         Index LA diam:    4.80 cm 2.44 cm/m   AORTA Ao Root diam: 3.20 cm Ao Asc diam:  3.30 cm  SHUNTS Systemic Diam: 2.00 cm Dorris Carnes MD Electronically signed by Dorris Carnes MD Signature Date/Time: 03/08/2022/4:08:59 PM    Final     Orson Eva, DO  [facility]  If 7PM-7AM, please contact night-coverage [email or web address] Password [email or web address] 03/09/2022, 4:56 PM   LOS: 3 days

## 2022-03-09 NOTE — Evaluation (Signed)
Physical Therapy Evaluation Patient Details Name: John [name] MRN: [id number] DOB: [birth year] Today's Date: 03/09/2022  History of Present Illness  Lamarion Mcevers is a 81 y.o. 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  Patient reports symptoms has been ongoing for the last 4 days now and despite visit to his primary care physician who adjust his diuretics to double the dose for 3 days he had not noticed any improvement in symptoms and in fact has noticing even more shortness of breath.  On the day of admission EMS was contacted and on arrival patient was found with increased work of breathing and requiring up to 15 L without significant improvement.  Patient was transferred to the emergency department for further evaluation and management.  Patient reports orthopnea, sudden increase swelling in his lower extremities and also nonproductive coughing spells.  He denies chest pain, nausea, vomiting, dysuria, hematuria, melena, hematochezia, abdominal pain, or any other complaints.   Clinical Impression  Patient is independent with supine to sit and sit to supine bed mobility with demonstration of performing task in appropriate amount of time and muscular coordination to complete task. Patient is modified independent with sit to stand and bed to chair transfers with supervision by PT and RW assist. Transfers did fatigue patient with SpO2 reaching 85% during assessment, in which patient rested while SpO2 increased to 95% before assessment resumed. Patient was able to ambulate 20 feet in room with PT supervision and use of RW. Patient was mainly limited by fatigue and vitals were assessed with SpO2 averaging around 93% during  ambulation. Patient returned to chair with nursing notified of mobility status. Patient will benefit from continued skilled physical therapy in hospital and recommended venue below to increase strength, balance, endurance for safe ADLs and gait.      Recommendations for follow up therapy are one component of a multi-disciplinary discharge planning process, led by the attending physician.  Recommendations may be updated based on patient status, additional functional criteria and insurance authorization.  Follow Up Recommendations Home health PT    Assistance Recommended at Discharge PRN  Patient can return home with the following  A little help with walking and/or transfers;Help with stairs or ramp for entrance;Assist for transportation;A little help with bathing/dressing/bathroom;Assistance with cooking/housework    Equipment Recommendations None recommended by PT  Recommendations for Other Services       Functional Status Assessment Patient has had a recent decline in their functional status and demonstrates the ability to make significant improvements in function in a reasonable and predictable amount of time.     Precautions / Restrictions Precautions Precautions: Fall Restrictions Weight Bearing Restrictions: No      Mobility  Bed Mobility Overal bed mobility: Independent             General bed mobility comments: Patient able to perform supine to sit and sit to supine bed mobility in appropriate amount of time and with no assistance.    Transfers Overall transfer level: Modified independent Equipment used: Rolling walker (2 wheels)               General transfer comment: Patient is modified independent with sit to stand and chair to bed transfer with needing supervision and RW for assistance. Patient demonstrates appropriate strength to complete  task but does have minor balance deficits.    Ambulation/Gait Ambulation/Gait assistance: Modified independent  (Device/Increase time), Supervision Gait Distance (Feet): 20 Feet Assistive device: Rolling walker (2 wheels) Gait Pattern/deviations: Step-to pattern, Decreased step length - right, Decreased step length - left, Decreased stride length Gait velocity: decreased     General Gait Details: Patient able to ambulate for 20 feet with PT supervision and RW used for assistance. Patient needed minor cueing. Patient was mainly limited by fatigue with minor balance deficits observed. Patient ambulate with 4L of oxygen used.  Vitals were assessed with patient maintaining SpO2 in low 90%.  Stairs            Wheelchair Mobility    Modified Rankin (Stroke Patients Only)       Balance Overall balance assessment: Needs assistance Sitting-balance support: Bilateral upper extremity supported, Feet supported Sitting balance-Leahy Scale: Good Sitting balance - Comments: Patient able to sit EOB with ability to support himself in upright position.   Standing balance support: Bilateral upper extremity supported, During functional activity, Reliant on assistive device for balance Standing balance-Leahy Scale: Fair Standing balance comment: Patient reliant on RW for standing balance with minor balance deficits observed when reaching outside BOS                             Pertinent Vitals/Pain Pain Assessment Pain Assessment: No/denies pain    Home Living Family/patient expects to be discharged to:: Private residence Living Arrangements: Children Available Help at Discharge: Available PRN/intermittently Type of Home: House Home Access: Stairs to enter Entrance Stairs-Rails: Left Entrance Stairs-Number of Steps: 3   Home Layout: One level Home Equipment: Cane - single point;Rollator (4 wheels);Shower seat;Wheelchair - manual;Grab bars - tub/shower;Grab bars - toilet      Prior Function Prior Level of Function : Needs assist       Physical Assist : ADLs (physical)   ADLs  (physical): IADLs Mobility Comments: Patient reports mainly ambulating around home with rollator and is a limited community ambulator. Patient is not currently driving. ADLs Comments: Patient is modified independent for ADL's with family assisting with other functional tasks as needed.     Hand Dominance   Dominant Hand: Right    Extremity/Trunk Assessment   Upper Extremity Assessment Upper Extremity Assessment: Overall WFL for tasks assessed    Lower Extremity Assessment Lower Extremity Assessment: Generalized weakness    Cervical / Trunk Assessment Cervical / Trunk Assessment: Normal  Communication   Communication: No difficulties  Cognition Arousal/Alertness: Awake/alert Behavior During Therapy: WFL for tasks assessed/performed Overall Cognitive Status: Within Functional Limits for tasks assessed                                          General Comments      Exercises     Assessment/Plan    PT Assessment Patient needs continued PT services;All further PT needs can be met in the next venue of care  PT Problem List Decreased strength;Decreased activity tolerance;Decreased balance;Decreased mobility;Decreased coordination       PT Treatment Interventions DME instruction;Gait training;Functional mobility training;Therapeutic activities;Therapeutic exercise;Balance training    PT Goals (Current goals can be found in the Care Plan section)  Acute Rehab PT Goals Patient Stated Goal: return home PT Goal Formulation: With patient Time For Goal Achievement: 03/23/22 Potential to Achieve Goals: Good  Frequency Min 3X/week     Co-evaluation               AM-PAC PT "6 Clicks" Mobility  Outcome Measure Help needed turning from your back to your side while in a flat bed without using bedrails?: None Help needed moving from lying on your back to sitting on the side of a flat bed without using bedrails?: None Help needed moving to and from a bed  to a chair (including a wheelchair)?: A Little Help needed standing up from a chair using your arms (e.g., wheelchair or bedside chair)?: A Little Help needed to walk in hospital room?: A Little Help needed climbing 3-5 steps with a railing? : A Lot 6 Click Score: 19    End of Session Equipment Utilized During Treatment: Oxygen Activity Tolerance: Patient tolerated treatment well;Patient limited by fatigue;No increased pain Patient left: in chair;with call bell/phone within reach Nurse Communication: Mobility status PT Visit Diagnosis: Unsteadiness on feet (R26.81);Other abnormalities of gait and mobility (R26.89);Muscle weakness (generalized) (M62.81)    Time: 5885-0277 PT Time Calculation (min) (ACUTE ONLY): 20 min   Charges:   PT Evaluation $PT Eval Moderate Complexity: 1 Mod PT Treatments $Therapeutic Activity: 8-22 mins        12:02 PM, 03/09/22 Lestine Box, S/PT

## 2022-03-09 NOTE — Plan of Care (Signed)
  Problem: Education: Goal: Ability to demonstrate management of disease process will improve Outcome: Progressing Goal: Ability to verbalize understanding of medication therapies will improve Outcome: Progressing Goal: Individualized Educational Video(s) Outcome: Progressing   Problem: Activity: Goal: Capacity to carry out activities will improve Outcome: Progressing   Problem: Cardiac: Goal: Ability to achieve and maintain adequate cardiopulmonary perfusion will improve Outcome: Progressing   Problem: Education: Goal: Knowledge of disease or condition will improve Outcome: Progressing Goal: Knowledge of the prescribed therapeutic regimen will improve Outcome: Progressing Goal: Individualized Educational Video(s) Outcome: Progressing   Problem: Activity: Goal: Ability to tolerate increased activity will improve Outcome: Progressing Goal: Will verbalize the importance of balancing activity with adequate rest periods Outcome: Progressing   Problem: Respiratory: Goal: Ability to maintain a clear airway will improve Outcome: Progressing Goal: Levels of oxygenation will improve Outcome: Progressing Goal: Ability to maintain adequate ventilation will improve Outcome: Progressing   Problem: Education: Goal: Knowledge of General Education information will improve Description Including pain rating scale, medication(s)/side effects and non-pharmacologic comfort measures Outcome: Progressing   Problem: Health Behavior/Discharge Planning: Goal: Ability to manage health-related needs will improve Outcome: Progressing   Problem: Clinical Measurements: Goal: Ability to maintain clinical measurements within normal limits will improve Outcome: Progressing Goal: Will remain free from infection Outcome: Progressing Goal: Diagnostic test results will improve Outcome: Progressing Goal: Respiratory complications will improve Outcome: Progressing Goal: Cardiovascular complication  will be avoided Outcome: Progressing   Problem: Activity: Goal: Risk for activity intolerance will decrease Outcome: Progressing   Problem: Nutrition: Goal: Adequate nutrition will be maintained Outcome: Progressing   Problem: Coping: Goal: Level of anxiety will decrease Outcome: Progressing   Problem: Elimination: Goal: Will not experience complications related to bowel motility Outcome: Progressing Goal: Will not experience complications related to urinary retention Outcome: Progressing   Problem: Pain Managment: Goal: General experience of comfort will improve Outcome: Progressing   Problem: Safety: Goal: Ability to remain free from injury will improve Outcome: Progressing   Problem: Skin Integrity: Goal: Risk for impaired skin integrity will decrease Outcome: Progressing   

## 2022-03-09 NOTE — Progress Notes (Signed)
Progress Note  Patient Name: John [name] Date of Encounter: 03/09/2022  [facility] Cardiologist: Kirk Ruths, MD   Subjective   Notes increase SOB over night, but improved since last evaluation. No CP. Sleeping this AM (using BIPAP- but with less non-nocturnal BIPAP.  Notes that when he has fluid he keeps it in his abdomen.  Inpatient Medications    Scheduled Meds:  allopurinol  300 mg Oral Daily   apixaban  2.5 mg Oral BID   arformoterol  15 mcg Nebulization BID   budesonide (PULMICORT) nebulizer solution  0.5 mg Nebulization Q12H   chlorhexidine  15 mL Mouth Rinse BID   Chlorhexidine Gluconate Cloth  6 each Topical Daily   clopidogrel  75 mg Oral Daily   furosemide  80 mg Intravenous BID   gabapentin  300 mg Oral QHS   hydrALAZINE  25 mg Oral BID   insulin aspart  0-15 Units Subcutaneous TID WC   insulin aspart  5 Units Subcutaneous TID WC   insulin detemir  15 Units Subcutaneous BID   ipratropium-albuterol  3 mL Nebulization Q6H   isosorbide dinitrate  20 mg Oral BID   mouth rinse  15 mL Mouth Rinse q12n4p   methylPREDNISolone (SOLU-MEDROL) injection  60 mg Intravenous Q12H   metoprolol succinate  50 mg Oral Daily   multivitamin with minerals  1 tablet Oral Daily   pantoprazole  40 mg Oral Q breakfast   rosuvastatin  10 mg Oral QPM   sertraline  25 mg Oral QPM   sodium chloride flush  3 mL Intravenous Q12H   tamsulosin  0.4 mg Oral Daily   Continuous Infusions:  sodium chloride     PRN Meds: sodium chloride, acetaminophen **OR** acetaminophen, albuterol, ALPRAZolam, HYDROcodone-acetaminophen, ondansetron **OR** ondansetron (ZOFRAN) IV, sodium chloride flush   Vital Signs    Vitals:   03/09/22 0437 03/09/22 0500 03/09/22 0600 03/09/22 0700  BP:  (!) 161/82 (!) 152/71   Pulse:  71 67   Resp:  (!) 33 13   Temp: 98.1 F (36.7 C)   (!) 96.2 F (35.7 C)  TempSrc: Axillary   Axillary  SpO2:  97% 97%   Weight:  83.6 kg    Height:         Intake/Output Summary (Last 24 hours) at 03/09/2022 0938 Last data filed at 03/09/2022 0600 Gross per 24 hour  Intake 120 ml  Output 675 ml  Net -555 ml      03/09/2022    5:00 AM 03/08/2022    4:35 AM 03/07/2022    5:00 AM  Last 3 Weights  Weight (lbs) 184 lb 4.9 oz 188 lb 4.4 oz 186 lb 1.1 oz  Weight (kg) 83.6 kg 85.4 kg 84.4 kg      Telemetry    SR NSVT and VT listed are actually frequent PVCs - Personally Reviewed  ECG    No new tracings.   Physical Exam   GEN: Pleasant elderly male currently in no acute distress.   Neck: Elevated JVD with HJR at 45 degrees Cardiac: RRR, S1, S2 and D3 Respiratory: Rales along bases bilaterally.  GI: Soft, nontender, dullness to percussion with distention MS: no edema; No deformity. Neuro:  Nonfocal  Psych: Normal affect   Labs    High Sensitivity Troponin:  No results for input(s): TROPONINIHS in the last 720 hours.   Chemistry Recent Labs  Lab 03/06/22 1214 03/06/22 2237 03/07/22 0410 03/08/22 0512 03/09/22 0352  NA 139  --  136 133* 136  K 4.1  --  4.4 4.4 4.3  CL 100  --  99 95* 96*  CO2 31  --  29 28 30   GLUCOSE 208*  --  290* 345* 229*  BUN 45*  --  49* 62* 70*  CREATININE 2.13*  --  2.15* 2.48* 2.32*  CALCIUM 8.9  --  8.4* 8.6* 9.0  MG 1.9 2.1  --  2.1 2.3  PROT 6.8  --   --   --   --   ALBUMIN 3.5  --   --   --   --   AST 12*  --   --   --   --   ALT 11  --   --   --   --   ALKPHOS 61  --   --   --   --   BILITOT 0.9  --   --   --   --   GFRNONAA 30*  --  30* 25* 27*  ANIONGAP 8  --  8 10 10     Lipids No results for input(s): CHOL, TRIG, HDL, LABVLDL, LDLCALC, CHOLHDL in the last 168 hours.  Hematology Recent Labs  Lab 03/06/22 1214  WBC 11.4*  RBC 3.29*  HGB 9.5*  HCT 31.0*  MCV 94.2  MCH 28.9  MCHC 30.6  RDW 17.1*  PLT 182   Thyroid No results for input(s): TSH, FREET4 in the last 168 hours.  BNP Recent Labs  Lab 03/06/22 1214  BNP 1,020.0*    DDimer No results for input(s): DDIMER  in the last 168 hours.   Radiology    ECHOCARDIOGRAM LIMITED  Result Date: 03/08/2022    ECHOCARDIOGRAM LIMITED REPORT   Patient Name:   John [name] Date of Exam: 03/08/2022 Medical Rec #:  [id number]     Height:       67.0 in Accession #:    [id number]    Weight:       188.3 lb Date of Birth:  January 03, 1941      BSA:          1.971 m Patient Age:    55 years      BP:           134/84 mmHg Patient Gender: M             HR:           87 bpm. Exam Location:  [facility] Procedure: Limited Echo and Intracardiac Opacification Agent Indications:    CHF  History:        Patient has prior history of Echocardiogram examinations, most                 recent 01/26/2022. CHF, CAD and Previous Myocardial Infarction,                 COPD, Arrythmias:Atrial Fibrillation, Signs/Symptoms:Chest Pain;                 Risk Factors:Hypertension, Diabetes and Dyslipidemia.  Sonographer:    Wenda Low Referring Phys: [phone] DAVID TAT IMPRESSIONS  1. Limited study.  2. Poor acoustic windows.  3. LVEF is severely depressed with hypokinesis worse in the lateral, anterior and apical walls . Left ventricular ejection fraction, by estimation, is 25 to 30%. The left ventricle has severely decreased function. The left ventricular internal cavity size was moderately dilated. There is mild left ventricular hypertrophy.  4. Right ventricular systolic function is normal. The right ventricular  size is normal.  5. The inferior vena cava is dilated in size with <50% respiratory variability, suggesting right atrial pressure of 15 mmHg. FINDINGS  Left Ventricle: LVEF is severely depressed with hypokinesis worse in the lateral, anterior and apical walls. Left ventricular ejection fraction, by estimation, is 25 to 30%. The left ventricle has severely decreased function. Definity contrast agent was  given IV to delineate the left ventricular endocardial borders. The left ventricular internal cavity size was moderately dilated. There is mild left  ventricular hypertrophy. Right Ventricle: The right ventricular size is normal. Right vetricular wall thickness was not assessed. Right ventricular systolic function is normal. Pericardium: Trivial pericardial effusion is present. Mitral Valve: There is mild thickening of the mitral valve leaflet(s). Mild mitral annular calcification. Tricuspid Valve: The tricuspid valve is not assessed. Pulmonic Valve: The pulmonic valve was grossly normal. Aorta: The aortic root and ascending aorta are structurally normal, with no evidence of dilitation. Venous: The inferior vena cava is dilated in size with less than 50% respiratory variability, suggesting right atrial pressure of 15 mmHg. LEFT VENTRICLE PLAX 2D LVIDd:         5.50 cm LVIDs:         4.80 cm LV PW:         1.10 cm LV IVS:        1.20 cm LVOT diam:     2.00 cm LVOT Area:     3.14 cm  LEFT ATRIUM         Index LA diam:    4.80 cm 2.44 cm/m   AORTA Ao Root diam: 3.20 cm Ao Asc diam:  3.30 cm  SHUNTS Systemic Diam: 2.00 cm Dorris Carnes MD Electronically signed by Dorris Carnes MD Signature Date/Time: 03/08/2022/4:08:59 PM    Final     Cardiac Studies   Limited Echo: 01/26/2022 IMPRESSIONS     1. Left ventricular ejection fraction, by estimation, is 30 to 35%. The  left ventricle has moderately decreased function. The left ventricle  demonstrates global hypokinesis. The left ventricular internal cavity size  was mildly to moderately dilated.  There is mild left ventricular hypertrophy.   2. Right ventricular systolic function is normal. The right ventricular  size is normal.   3. The inferior vena cava is dilated in size with >50% respiratory  variability, suggesting right atrial pressure of 8 mmHg.   Comparison(s): The left ventricular function is worsened.   Patient Profile     81 y.o. male with a hx of  CAD (s/p multiple stents with most recent cath in 2013 showing EF of 40% with DESx2 to LAD, NSTEMI in 12/2021 with EF of 35% and medical management  recommended given his AKI), HFrEF (EF 40-45% in 08/2021, at 35% in 12/2021, 30-35% in 01/2022), paroxysmal atrial fibrillation, Type 2 DM, HTN, HLD, PAD (s/p right fem-pop bypass at [facility] in 05/2018), Stage 3-4 CKD, COPD, OSA and prior CVA who is currently admitted for acute hypoxic respiratory failure. Cardiology consulted for CHF.   Assessment & Plan    1. Acute on Chronic Hypoxic Respiratory Failure - Multifactorial in the setting of acute COPD and CHF exacerbations. Management of CHF as outlined below and COPD is being managed by the admitting team.    2. Acute HFrEF - continue lasix 80 IV BID - BNP for 03/10/22 - May need to tolerate a higher creatinine to appropriately diurese him.  - weight is stable; recommend daily standing weights - Continue Toprol-XL, Hydralazine and Isordil  for his cardiomyopathy. No ACE-I/ARB/ARNI/SGLT2 inhibitor given his renal function.    3. CAD - NSTEMI in 12/2021 with EF of 35% and medical management recommended given his AKI. - - Continue medical therapy with Plavix, Crestor, Isordil and Toprol-XL.    4. Paroxysmal Atrial Fibrillation  - presently in SR;  -  Continue Toprol-XL 50mg  daily for rate-control and Eliquis 2.5mg  BID for anticoagulation (age and Creatinine)   5. HLD - Continue Crestor 10mg  daily. LDL was at 29 in 12/2021.  6. Stage 3-4 CKD - His creatinine was at 2.13 on admission which is close to his baseline. Trending down to day with diuresis   For questions or updates, please contact [place] Please consult [email or web address] for contact info under        Signed, Werner Lean, MD  03/09/2022, 9:38 AM

## 2022-03-10 DIAGNOSIS — J441 Chronic obstructive pulmonary disease with (acute) exacerbation: Secondary | ICD-10-CM | POA: Diagnosis not present

## 2022-03-10 DIAGNOSIS — N184 Chronic kidney disease, stage 4 (severe): Secondary | ICD-10-CM | POA: Diagnosis not present

## 2022-03-10 DIAGNOSIS — I5043 Acute on chronic combined systolic (congestive) and diastolic (congestive) heart failure: Secondary | ICD-10-CM | POA: Diagnosis not present

## 2022-03-10 DIAGNOSIS — J9621 Acute and chronic respiratory failure with hypoxia: Secondary | ICD-10-CM | POA: Diagnosis not present

## 2022-03-10 LAB — GLUCOSE, CAPILLARY
Glucose-Capillary: 212 mg/dL — ABNORMAL HIGH (ref 70–99)
Glucose-Capillary: 266 mg/dL — ABNORMAL HIGH (ref 70–99)
Glucose-Capillary: 274 mg/dL — ABNORMAL HIGH (ref 70–99)
Glucose-Capillary: 74 mg/dL (ref 70–99)

## 2022-03-10 LAB — BASIC METABOLIC PANEL
Anion gap: 10 (ref 5–15)
BUN: 80 mg/dL — ABNORMAL HIGH (ref 8–23)
CO2: 30 mmol/L (ref 22–32)
Calcium: 8.6 mg/dL — ABNORMAL LOW (ref 8.9–10.3)
Chloride: 97 mmol/L — ABNORMAL LOW (ref 98–111)
Creatinine, Ser: 2.13 mg/dL — ABNORMAL HIGH (ref 0.61–1.24)
GFR, Estimated: 30 mL/min — ABNORMAL LOW (ref >60)
Glucose, Bld: 172 mg/dL — ABNORMAL HIGH (ref 70–99)
Potassium: 3.8 mmol/L (ref 3.5–5.1)
Sodium: 137 mmol/L (ref 135–145)

## 2022-03-10 LAB — URINE CULTURE: Culture: >=100000 — AB

## 2022-03-10 LAB — BRAIN NATRIURETIC PEPTIDE: B Natriuretic Peptide: 817 pg/mL — ABNORMAL HIGH (ref 0.0–100.0)

## 2022-03-10 MED ORDER — PREDNISONE 20 MG PO TABS
50.0000 mg | ORAL_TABLET | Freq: Every day | ORAL | Status: DC
  Administered 2022-03-10 – 2022-03-12 (×3): 50 mg via ORAL
  Filled 2022-03-10 (×3): qty 1

## 2022-03-10 NOTE — Progress Notes (Signed)
PROGRESS NOTE  John [name] ZCH:[id number] DOB: 11-28-1940 DOA: 03/06/2022 PCP: [name], DO  Brief History:  81 year old male with a history of recent NSTEMI 12/2021, diabetes mellitus type 2, hyperlipidemia, hypertension, paroxysmal atrial fibrillation, GERD, BPH, chronic respiratory failure on 4 L, and CKD stage 4 presenting with shortness of breath for about 1 week.  He stated that his legs have not been significantly out more swollen but had been having increasing abdominal girth and orthopnea type symptoms.  He quit smoking over 40 years ago.  Upon EMS arrival, the patient was noted to have some respiratory distress and placed on 15 L [place].  He was placed on BiPAP in the emergency department.  He states that he had been taking double his usual dosage of torsemide for the past 3 days prior to admission without improvement.  He normally takes torsemide 40 mg daily. Notably, the patient had a recent hospital admission from 01/26/2022 to 02/05/2022 for acute on chronic respiratory failure due to CHF exacerbation and COPD exacerbation.  His discharge weight was 184 pounds.  He was discharged home with torsemide 40 mg daily. He was also recently admitted to the hospital from 01/08/2022 to 01/10/2022 for acute on chronic systolic and diastolic CHF.  He was discharged home with torsemide 20 mg daily. In the ED, the patient was afebrile and hemodynamically stable with oxygen saturation 98% on BiPAP.  BMP showed sodium 134 and serum creatinine 2.13.  WBC 11.4, hemoglobin 9.5, platelets 282,000.  LFTs were unremarkable.  Chest x-ray showed pulmonary edema.  EKG shows sinus rhythm with bigeminy.  The patient was started on IV steroids, IV furosemide, and bronchodilators.  Cardiology was consulted to assist with management.   Assessment/Plan:   Principal Problem:   Acute on chronic respiratory failure with hypoxia (HCC) Active Problems:   COPD exacerbation (HCC)   Acute on chronic combined  systolic and diastolic CHF (congestive heart failure) (HCC)   CKD (chronic kidney disease) stage 4, GFR 15-29 ml/min (HCC)   Paroxysmal atrial fibrillation (HCC)   CAD (coronary artery disease) of artery bypass graft   Mixed hyperlipidemia   Peripheral vascular disease ([place])   Essential hypertension   Type 2 diabetes mellitus with hyperglycemia (HCC)   BPH (benign prostatic hyperplasia)   Acute urinary retention   Anxiety  Assessment and Plan: * Acute on chronic respiratory failure with hypoxia (HCC) -due to COPD and CHF exacerbation. -chronically on 4L [place] -experiencing increased work of breathing and borderline saturation while using 15 L of oxygen.  He has been placed on BiPAP and admitted to stepdown bed. -IV diuresis has been started; will continue the use of isosorbide and metoprolol.  -Given chronic renal failure not a candidate for ACE or ARB regimen. -Cardiology consulted -ReDS protocol requested -Daily weights, low-sodium diet, strict intake and output, follow clinical response. -personally reviewed chest x-ray with interstitial edema  COPD exacerbation ([place]) Continue Pulmicort and Brovana Continue DuoNebs Continue IV Solu-Medrol>>de-escalate to prednisone Continues to require intermittent BiPAP  Acute on chronic combined systolic and diastolic CHF (congestive heart failure) ([place]) 12/25/21 echo EF 35%, global HK, grade 2 DD 01/26/22 Echo  EF 30-35%, global HK Appreciate cardiology consult Continue IV furosemide to 80 mg bid Daily weights--not accurate Accurate I's and O's-=-incomplete 01/30/22 High Res CT chest>>Persistent bilateral bronchial wall thickening and interlobularseptal thickening, likely due to pulmonary edema. --obtain ReDS reading 5/18--50% --limited echo--EF 25-30%; trivial pericardial eff --remains clinically fluid overloaded  CKD (chronic kidney disease) stage 4, GFR 15-29 ml/min (HCC) Baseline creatinine 2.1-2.4 Will need to tolerate increase in serum  creatinine for euvolemia  Paroxysmal atrial fibrillation (HCC) -Continue apixaban Continue metoprolol succinate Currently in sinus rhythm   Anxiety --encouraged patient to ask for home xanax  Acute urinary retention Patient had difficulty during last hospitalization requiring Foley catheter Check bladder scan Place Foley if continues to have retention  BPH (benign prostatic hyperplasia) -Continue Flomax.  Type 2 diabetes mellitus with hyperglycemia (HCC) - Recent A1c (December 24, 2021) 7.1 -Holding oral hypoglycemic agents while inpatient -increase levemir to 15 units bid -increase premeal insulin to 5 units -Anticipating elevated CBGs/fluctuation with the use of his steroids  Essential hypertension Continue metoprolol succinate Hydralazine added -not a candidate for ACEi or ARB due to CKD  Peripheral vascular disease ([place]) - He is s/p right fem-pop bypass at [facility] in 05/2018. Remains on Plavix and statin therapy.     Mixed hyperlipidemia - Continue Crestor.  CAD (coronary artery disease) of artery bypass graft - No chest pain -No acute ischemic changes appreciated on EKG -Continue the use of beta-blocker, Imdur, Plavix and statin.            Family Communication:  no Family at bedside  Consultants:  cardiology  Code Status:  FULL   DVT Prophylaxis:  apixaban   Procedures: As Listed in Progress Note Above  Antibiotics: None      Subjective: Pt is breathing better, but still has dyspnea on exertion.  Denies f/c, cp, nvd, abd pain  Objective: Vitals:   03/10/22 1000 03/10/22 1100 03/10/22 1427 03/10/22 1540  BP: (!) 145/66 (!) 141/99  132/60  Pulse: 80 94  79  Resp: (!) 21 (!) 23  (!) 21  Temp:  97.8 F (36.6 C)    TempSrc:      SpO2: 97% (!) 87% 97% 96%  Weight:      Height:        Intake/Output Summary (Last 24 hours) at 03/10/2022 1645 Last data filed at 03/10/2022 1540 Gross per 24 hour  Intake 600 ml  Output 1225 ml   Net -625 ml   Weight change: 0.1 kg Exam:  General:  Pt is alert, follows commands appropriately, not in acute distress HEENT: No icterus, No thrush, No neck mass, [place]/AT Cardiovascular: RRR, S1/S2, no rubs, no gallops Respiratory: bibasilar crackles. No wheeze Abdomen: Soft/+BS, non tender, non distended, no guarding Extremities: trace LE edema, No lymphangitis, No petechiae, No rashes, no synovitis   Data Reviewed: I have personally reviewed following labs and imaging studies Basic Metabolic Panel: Recent Labs  Lab 03/06/22 1214 03/06/22 2237 03/07/22 0410 03/08/22 0512 03/09/22 0352 03/10/22 0058  NA 139  --  136 133* 136 137  K 4.1  --  4.4 4.4 4.3 3.8  CL 100  --  99 95* 96* 97*  CO2 31  --  29 28 30 30   GLUCOSE 208*  --  290* 345* 229* 172*  BUN 45*  --  49* 62* 70* 80*  CREATININE 2.13*  --  2.15* 2.48* 2.32* 2.13*  CALCIUM 8.9  --  8.4* 8.6* 9.0 8.6*  MG 1.9 2.1  --  2.1 2.3  --   PHOS 3.8  --   --   --   --   --    Liver Function Tests: Recent Labs  Lab 03/06/22 1214  AST 12*  ALT 11  ALKPHOS 61  BILITOT 0.9  PROT 6.8  ALBUMIN 3.5   No results for input(s): LIPASE, AMYLASE in the last 168 hours. No results for input(s): AMMONIA in the last 168 hours. Coagulation Profile: No results for input(s): INR, PROTIME in the last 168 hours. CBC: Recent Labs  Lab 03/06/22 1214  WBC 11.4*  NEUTROABS 9.9*  HGB 9.5*  HCT 31.0*  MCV 94.2  PLT 182   Cardiac Enzymes: No results for input(s): CKTOTAL, CKMB, CKMBINDEX, TROPONINI in the last 168 hours. BNP: Invalid input(s): POCBNP CBG: Recent Labs  Lab 03/09/22 1203 03/09/22 1526 03/09/22 2048 03/10/22 0801 03/10/22 1208  GLUCAP 281* 373* 167* 212* 266*   HbA1C: No results for input(s): HGBA1C in the last 72 hours. Urine analysis:    Component Value Date/Time   COLORURINE YELLOW 03/08/2022 0728   APPEARANCEUR CLEAR 03/08/2022 0728   LABSPEC 1.011 03/08/2022 0728   PHURINE 6.0 03/08/2022 0728    GLUCOSEU 150 (A) 03/08/2022 0728   HGBUR NEGATIVE 03/08/2022 0728   BILIRUBINUR NEGATIVE 03/08/2022 0728   KETONESUR NEGATIVE 03/08/2022 0728   PROTEINUR NEGATIVE 03/08/2022 0728   NITRITE NEGATIVE 03/08/2022 0728   LEUKOCYTESUR NEGATIVE 03/08/2022 0728   Sepsis Labs: @LABRCNTIP (procalcitonin:4,lacticidven:4) ) Recent Results (from the past 240 hour(s))  MRSA Next Gen by PCR, Nasal     Status: None   Collection Time: 03/06/22  7:13 PM   Specimen: Nasal Mucosa; Nasal Swab  Result Value Ref Range Status   MRSA by PCR Next Gen NOT DETECTED NOT DETECTED Final    Comment: (NOTE) The GeneXpert MRSA Assay (FDA approved for NASAL specimens only), is one component of a comprehensive MRSA colonization surveillance program. It is not intended to diagnose MRSA infection nor to guide or monitor treatment for MRSA infections. Test performance is not FDA approved in patients less than 57 years old. Performed at [facility], [address] [place], [place] [address]   Urine Culture     Status: Abnormal   Collection Time: 03/08/22 11:15 AM   Specimen: Urine, Clean Catch  Result Value Ref Range Status   Specimen Description   Final    URINE, CLEAN CATCH Performed at [facility], [address], [place], [place] [address]    Special Requests   Final    NONE Performed at [facility], [address], [place], [place] [address]    Culture >=100,000 COLONIES/mL ENTEROCOCCUS FAECALIS (A)  Final   Report Status 03/10/2022 FINAL  Final   Organism ID, Bacteria ENTEROCOCCUS FAECALIS (A)  Final      Susceptibility   Enterococcus faecalis - MIC*    AMPICILLIN <=2 SENSITIVE Sensitive     NITROFURANTOIN <=16 SENSITIVE Sensitive     VANCOMYCIN 1 SENSITIVE Sensitive     * >=100,000 COLONIES/mL ENTEROCOCCUS FAECALIS     Scheduled Meds:  allopurinol  300 mg Oral Daily   apixaban  2.5 mg Oral BID   arformoterol  15 mcg Nebulization BID   budesonide (PULMICORT) nebulizer solution  0.5 mg  Nebulization Q12H   chlorhexidine  15 mL Mouth Rinse BID   Chlorhexidine Gluconate Cloth  6 each Topical Daily   clopidogrel  75 mg Oral Daily   furosemide  80 mg Intravenous BID   gabapentin  300 mg Oral QHS   hydrALAZINE  25 mg Oral BID   insulin aspart  0-15 Units Subcutaneous TID WC   insulin aspart  8 Units Subcutaneous TID WC   insulin detemir  15 Units Subcutaneous BID   ipratropium-albuterol  3 mL Nebulization Q6H  isosorbide dinitrate  20 mg Oral BID   mouth rinse  15 mL Mouth Rinse q12n4p   metoprolol succinate  50 mg Oral Daily   multivitamin with minerals  1 tablet Oral Daily   pantoprazole  40 mg Oral Q breakfast   predniSONE  50 mg Oral Q breakfast   rosuvastatin  10 mg Oral QPM   sertraline  25 mg Oral QPM   sodium chloride flush  3 mL Intravenous Q12H   tamsulosin  0.4 mg Oral Daily   Continuous Infusions:  sodium chloride      Procedures/Studies: DG Chest Port 1 View  Result Date: 03/06/2022 CLINICAL DATA:  Shortness of breath. EXAM: PORTABLE CHEST 1 VIEW COMPARISON:  February 01, 2022. FINDINGS: Stable cardiomegaly. Bilateral pulmonary edema is noted. Bibasilar atelectasis is noted with small right pleural effusion. Bony thorax is unremarkable. IMPRESSION: Bilateral pulmonary edema and bibasilar atelectasis is noted with small right pleural effusion. Electronically Signed   By: Marijo Conception M.D.   On: 03/06/2022 12:32   ECHOCARDIOGRAM LIMITED  Result Date: 03/08/2022    ECHOCARDIOGRAM LIMITED REPORT   Patient Name:   John [name] Date of Exam: 03/08/2022 Medical Rec #:  [id number]     Height:       67.0 in Accession #:    [id number]    Weight:       188.3 lb Date of Birth:  05-05-1941      BSA:          1.971 m Patient Age:    34 years      BP:           134/84 mmHg Patient Gender: M             HR:           87 bpm. [facility] Procedure: Limited Echo and Intracardiac Opacification Agent Indications:    CHF  History:        Patient has prior history  of Echocardiogram examinations, most                 recent 01/26/2022. CHF, CAD and Previous Myocardial Infarction,                 COPD, Arrythmias:Atrial Fibrillation, Signs/Symptoms:Chest Pain;                 Risk Factors:Hypertension, Diabetes and Dyslipidemia.  Sonographer:    Wenda Low Referring Phys: [phone] Devion Chriscoe IMPRESSIONS  1. Limited study.  2. Poor acoustic windows.  3. LVEF is severely depressed with hypokinesis worse in the lateral, anterior and apical walls . Left ventricular ejection fraction, by estimation, is 25 to 30%. The left ventricle has severely decreased function. The left ventricular internal cavity size was moderately dilated. There is mild left ventricular hypertrophy.  4. Right ventricular systolic function is normal. The right ventricular size is normal.  5. The inferior vena cava is dilated in size with <50% respiratory variability, suggesting right atrial pressure of 15 mmHg. FINDINGS  Left Ventricle: LVEF is severely depressed with hypokinesis worse in the lateral, anterior and apical walls. Left ventricular ejection fraction, by estimation, is 25 to 30%. The left ventricle has severely decreased function. Definity contrast agent was  given IV to delineate the left ventricular endocardial borders. The left ventricular internal cavity size was moderately dilated. There is mild left ventricular hypertrophy. Right Ventricle: The right ventricular size is normal. Right vetricular wall thickness was not assessed. Right  ventricular systolic function is normal. Pericardium: Trivial pericardial effusion is present. Mitral Valve: There is mild thickening of the mitral valve leaflet(s). Mild mitral annular calcification. Tricuspid Valve: The tricuspid valve is not assessed. Pulmonic Valve: The pulmonic valve was grossly normal. Aorta: The aortic root and ascending aorta are structurally normal, with no evidence of dilitation. Venous: The inferior vena cava is dilated in size with less  than 50% respiratory variability, suggesting right atrial pressure of 15 mmHg. LEFT VENTRICLE PLAX 2D LVIDd:         5.50 cm LVIDs:         4.80 cm LV PW:         1.10 cm LV IVS:        1.20 cm LVOT diam:     2.00 cm LVOT Area:     3.14 cm  LEFT ATRIUM         Index LA diam:    4.80 cm 2.44 cm/m   AORTA Ao Root diam: 3.20 cm Ao Asc diam:  3.30 cm  SHUNTS Systemic Diam: 2.00 cm Dorris Carnes MD Electronically signed by Dorris Carnes MD Signature Date/Time: 03/08/2022/4:08:59 PM    Final     Orson Eva, DO  [facility]  If 7PM-7AM, please contact night-coverage [email or web address] Password [email or web address] 03/10/2022, 4:45 PM   LOS: 4 days

## 2022-03-11 DIAGNOSIS — J9621 Acute and chronic respiratory failure with hypoxia: Secondary | ICD-10-CM | POA: Diagnosis not present

## 2022-03-11 DIAGNOSIS — N184 Chronic kidney disease, stage 4 (severe): Secondary | ICD-10-CM | POA: Diagnosis not present

## 2022-03-11 DIAGNOSIS — J449 Chronic obstructive pulmonary disease, unspecified: Secondary | ICD-10-CM | POA: Diagnosis not present

## 2022-03-11 DIAGNOSIS — I5043 Acute on chronic combined systolic (congestive) and diastolic (congestive) heart failure: Secondary | ICD-10-CM | POA: Diagnosis not present

## 2022-03-11 LAB — CBC
HCT: 28.7 % — ABNORMAL LOW (ref 39.0–52.0)
Hemoglobin: 8.9 g/dL — ABNORMAL LOW (ref 13.0–17.0)
MCH: 28.2 pg (ref 26.0–34.0)
MCHC: 31 g/dL (ref 30.0–36.0)
MCV: 90.8 fL (ref 80.0–100.0)
Platelets: 196 10*3/uL (ref 150–400)
RBC: 3.16 MIL/uL — ABNORMAL LOW (ref 4.22–5.81)
RDW: 16.3 % — ABNORMAL HIGH (ref 11.5–15.5)
WBC: 10.4 10*3/uL (ref 4.0–10.5)
nRBC: 0.2 % (ref 0.0–0.2)

## 2022-03-11 LAB — BASIC METABOLIC PANEL
Anion gap: 9 (ref 5–15)
BUN: 83 mg/dL — ABNORMAL HIGH (ref 8–23)
CO2: 31 mmol/L (ref 22–32)
Calcium: 8.8 mg/dL — ABNORMAL LOW (ref 8.9–10.3)
Chloride: 98 mmol/L (ref 98–111)
Creatinine, Ser: 2.22 mg/dL — ABNORMAL HIGH (ref 0.61–1.24)
GFR, Estimated: 29 mL/min — ABNORMAL LOW (ref >60)
Glucose, Bld: 213 mg/dL — ABNORMAL HIGH (ref 70–99)
Potassium: 5.1 mmol/L (ref 3.5–5.1)
Sodium: 138 mmol/L (ref 135–145)

## 2022-03-11 LAB — GLUCOSE, CAPILLARY
Glucose-Capillary: 223 mg/dL — ABNORMAL HIGH (ref 70–99)
Glucose-Capillary: 278 mg/dL — ABNORMAL HIGH (ref 70–99)
Glucose-Capillary: 304 mg/dL — ABNORMAL HIGH (ref 70–99)
Glucose-Capillary: 169 mg/dL — ABNORMAL HIGH (ref 70–99)
Glucose-Capillary: 258 mg/dL — ABNORMAL HIGH (ref 70–99)

## 2022-03-11 MED ORDER — INSULIN ASPART 100 UNIT/ML IJ SOLN
10.0000 [IU] | Freq: Three times a day (TID) | INTRAMUSCULAR | Status: DC
  Administered 2022-03-11 – 2022-03-12 (×3): 10 [IU] via SUBCUTANEOUS

## 2022-03-11 NOTE — Progress Notes (Signed)
PROGRESS NOTE  John [name] IFO:[id number] DOB: 1941/02/09 DOA: 03/06/2022 PCP: [name], DO  Brief History:  81 year old male with a history of recent NSTEMI 12/2021, diabetes mellitus type 2, hyperlipidemia, hypertension, paroxysmal atrial fibrillation, GERD, BPH, chronic respiratory failure on 4 L, and CKD stage 4 presenting with shortness of breath for about 1 week.  He stated that his legs have not been significantly out more swollen but had been having increasing abdominal girth and orthopnea type symptoms.  He quit smoking over 40 years ago.  Upon EMS arrival, the patient was noted to have some respiratory distress and placed on 15 L [place].  He was placed on BiPAP in the emergency department.  He states that he had been taking double his usual dosage of torsemide for the past 3 days prior to admission without improvement.  He normally takes torsemide 40 mg daily. Notably, the patient had a recent hospital admission from 01/26/2022 to 02/05/2022 for acute on chronic respiratory failure due to CHF exacerbation and COPD exacerbation.  His discharge weight was 184 pounds.  He was discharged home with torsemide 40 mg daily. He was also recently admitted to the hospital from 01/08/2022 to 01/10/2022 for acute on chronic systolic and diastolic CHF.  He was discharged home with torsemide 20 mg daily. In the ED, the patient was afebrile and hemodynamically stable with oxygen saturation 98% on BiPAP.  BMP showed sodium 134 and serum creatinine 2.13.  WBC 11.4, hemoglobin 9.5, platelets 282,000.  LFTs were unremarkable.  Chest x-ray showed pulmonary edema.  EKG shows sinus rhythm with bigeminy.  The patient was started on IV steroids, IV furosemide, and bronchodilators.  Cardiology was consulted to assist with management.     Assessment and Plan: * Acute on chronic respiratory failure with hypoxia (HCC) -due to COPD and CHF exacerbation. -chronically on 4L [place] -experiencing increased work of  breathing and borderline saturation while using 15 L of oxygen.  He has been placed on BiPAP and admitted to stepdown bed. -IV diuresis has been started; will continue the use of isosorbide and metoprolol.  -Given chronic renal failure not a candidate for ACE or ARB regimen. -Cardiology consulted -ReDS protocol requested -Daily weights, low-sodium diet, strict intake and output, follow clinical response. -personally reviewed chest x-ray with interstitial edema  COPD exacerbation ([place]) Continue Pulmicort and Brovana Continue DuoNebs Continue IV Solu-Medrol>>de-escalate to prednisone Continues to require intermittent BiPAP  Acute on chronic combined systolic and diastolic CHF (congestive heart failure) ([place]) 12/25/21 echo EF 35%, global HK, grade 2 DD 01/26/22 Echo  EF 30-35%, global HK Appreciate cardiology consult Continue IV furosemide to 80 mg bid Daily weights--not accurate Accurate I's and O's-=-incomplete 01/30/22 High Res CT chest>>Persistent bilateral bronchial wall thickening and interlobularseptal thickening, likely due to pulmonary edema. --obtain ReDS reading 5/18--50% --limited echo--EF 25-30%; trivial pericardial eff --remains clinically fluid overloaded  CKD (chronic kidney disease) stage 4, GFR 15-29 ml/min (HCC) Baseline creatinine 2.1-2.4 Will need to tolerate increase in serum creatinine for euvolemia  Paroxysmal atrial fibrillation (HCC) -Continue apixaban Continue metoprolol succinate Currently in sinus rhythm   Anxiety --encouraged patient to ask for home xanax  Acute urinary retention Patient had difficulty during last hospitalization requiring Foley catheter Check bladder scan Place Foley if continues to have retention  BPH (benign prostatic hyperplasia) -Continue Flomax.  Type 2 diabetes mellitus with hyperglycemia (HCC) - Recent A1c (December 24, 2021) 7.1 -Holding oral hypoglycemic agents while inpatient -increase levemir  to 15 units bid -increase  premeal insulin to 10 units -Anticipating elevated CBGs/fluctuation with the use of his steroids  Essential hypertension Continue metoprolol succinate Hydralazine added -not a candidate for ACEi or ARB due to CKD  Peripheral vascular disease ([place]) - He is s/p right fem-pop bypass at [facility] in 05/2018. Remains on Plavix and statin therapy.     Mixed hyperlipidemia - Continue Crestor.  CAD (coronary artery disease) of artery bypass graft - No chest pain -No acute ischemic changes appreciated on EKG -Continue the use of beta-blocker, Imdur, Plavix and statin.               Family Communication:  no Family at bedside   Consultants:  cardiology   Code Status:  FULL    DVT Prophylaxis:  apixaban     Procedures: As Listed in Progress Note Above   Antibiotics: None         Subjective: Patient denies fevers, chills, headache, chest pain, dyspnea, nausea, vomiting, diarrhea, abdominal pain, dysuria, hematuria, hematochezia, and melena.   Objective: Vitals:   03/11/22 0812 03/11/22 0906 03/11/22 1106 03/11/22 1308  BP:      Pulse:      Resp:      Temp: 97.7 F (36.5 C)  97.7 F (36.5 C)   TempSrc: Oral  Oral   SpO2:  100%  99%  Weight:      Height:        Intake/Output Summary (Last 24 hours) at 03/11/2022 1339 Last data filed at 03/11/2022 1000 Gross per 24 hour  Intake 360 ml  Output 2500 ml  Net -2140 ml   Weight change: 0.397 kg Exam:  General:  Pt is alert, follows commands appropriately, not in acute distress HEENT: No icterus, No thrush, No neck mass, [place]/AT Cardiovascular: RRR, S1/S2, no rubs, no gallops Respiratory: bibasilar crackles. No wheeze Abdomen: Soft/+BS, non tender, non distended, no guarding Extremities: No edema, No lymphangitis, No petechiae, No rashes, no synovitis   Data Reviewed: I have personally reviewed following labs and imaging studies Basic Metabolic Panel: Recent Labs  Lab 03/06/22 1214 03/06/22 2237  03/07/22 0410 03/08/22 0512 03/09/22 0352 03/10/22 0058 03/11/22 0413  NA 139  --  136 133* 136 137 138  K 4.1  --  4.4 4.4 4.3 3.8 5.1  CL 100  --  99 95* 96* 97* 98  CO2 31  --  29 28 30 30 31   GLUCOSE 208*  --  290* 345* 229* 172* 213*  BUN 45*  --  49* 62* 70* 80* 83*  CREATININE 2.13*  --  2.15* 2.48* 2.32* 2.13* 2.22*  CALCIUM 8.9  --  8.4* 8.6* 9.0 8.6* 8.8*  MG 1.9 2.1  --  2.1 2.3  --   --   PHOS 3.8  --   --   --   --   --   --    Liver Function Tests: Recent Labs  Lab 03/06/22 1214  AST 12*  ALT 11  ALKPHOS 61  BILITOT 0.9  PROT 6.8  ALBUMIN 3.5   No results for input(s): LIPASE, AMYLASE in the last 168 hours. No results for input(s): AMMONIA in the last 168 hours. Coagulation Profile: No results for input(s): INR, PROTIME in the last 168 hours. CBC: Recent Labs  Lab 03/06/22 1214 03/11/22 0413  WBC 11.4* 10.4  NEUTROABS 9.9*  --   HGB 9.5* 8.9*  HCT 31.0* 28.7*  MCV 94.2 90.8  PLT 182 196  Cardiac Enzymes: No results for input(s): CKTOTAL, CKMB, CKMBINDEX, TROPONINI in the last 168 hours. BNP: Invalid input(s): POCBNP CBG: Recent Labs  Lab 03/10/22 1640 03/10/22 2154 03/11/22 0415 03/11/22 0811 03/11/22 1102  GLUCAP 274* 74 223* 278* 304*   HbA1C: No results for input(s): HGBA1C in the last 72 hours. Urine analysis:    Component Value Date/Time   COLORURINE YELLOW 03/08/2022 0728   APPEARANCEUR CLEAR 03/08/2022 0728   LABSPEC 1.011 03/08/2022 0728   PHURINE 6.0 03/08/2022 0728   GLUCOSEU 150 (A) 03/08/2022 0728   HGBUR NEGATIVE 03/08/2022 0728   BILIRUBINUR NEGATIVE 03/08/2022 0728   KETONESUR NEGATIVE 03/08/2022 0728   PROTEINUR NEGATIVE 03/08/2022 0728   NITRITE NEGATIVE 03/08/2022 0728   LEUKOCYTESUR NEGATIVE 03/08/2022 0728   Sepsis Labs: @LABRCNTIP (procalcitonin:4,lacticidven:4) ) Recent Results (from the past 240 hour(s))  MRSA Next Gen by PCR, Nasal     Status: None   Collection Time: 03/06/22  7:13 PM   Specimen:  Nasal Mucosa; Nasal Swab  Result Value Ref Range Status   MRSA by PCR Next Gen NOT DETECTED NOT DETECTED Final    Comment: (NOTE) The GeneXpert MRSA Assay (FDA approved for NASAL specimens only), is one component of a comprehensive MRSA colonization surveillance program. It is not intended to diagnose MRSA infection nor to guide or monitor treatment for MRSA infections. Test performance is not FDA approved in patients less than 73 years old. Performed at [facility], [address], [place], [place] [address]   Urine Culture     Status: Abnormal   Collection Time: 03/08/22 11:15 AM   Specimen: Urine, Clean Catch  Result Value Ref Range Status   Specimen Description   Final    URINE, CLEAN CATCH Performed at [facility], [address], [place], [place] [address]    Special Requests   Final    NONE Performed at [facility], [address] [place], [place] [address]    Culture >=100,000 COLONIES/mL ENTEROCOCCUS FAECALIS (A)  Final   Report Status 03/10/2022 FINAL  Final   Organism ID, Bacteria ENTEROCOCCUS FAECALIS (A)  Final      Susceptibility   Enterococcus faecalis - MIC*    AMPICILLIN <=2 SENSITIVE Sensitive     NITROFURANTOIN <=16 SENSITIVE Sensitive     VANCOMYCIN 1 SENSITIVE Sensitive     * >=100,000 COLONIES/mL ENTEROCOCCUS FAECALIS     Scheduled Meds:  allopurinol  300 mg Oral Daily   apixaban  2.5 mg Oral BID   arformoterol  15 mcg Nebulization BID   budesonide (PULMICORT) nebulizer solution  0.5 mg Nebulization Q12H   chlorhexidine  15 mL Mouth Rinse BID   Chlorhexidine Gluconate Cloth  6 each Topical Daily   clopidogrel  75 mg Oral Daily   furosemide  80 mg Intravenous BID   gabapentin  300 mg Oral QHS   hydrALAZINE  25 mg Oral BID   insulin aspart  0-15 Units Subcutaneous TID WC   insulin aspart  8 Units Subcutaneous TID WC   insulin detemir  15 Units Subcutaneous BID   ipratropium-albuterol  3 mL Nebulization Q6H   isosorbide dinitrate  20 mg Oral BID    mouth rinse  15 mL Mouth Rinse q12n4p   metoprolol succinate  50 mg Oral Daily   multivitamin with minerals  1 tablet Oral Daily   pantoprazole  40 mg Oral Q breakfast   predniSONE  50 mg Oral Q breakfast   rosuvastatin  10 mg Oral QPM   sertraline  25 mg Oral QPM   sodium chloride flush  3 mL Intravenous Q12H   tamsulosin  0.4 mg Oral Daily   Continuous Infusions:  sodium chloride      Procedures/Studies: DG Chest Port 1 View  Result Date: 03/06/2022 CLINICAL DATA:  Shortness of breath. EXAM: PORTABLE CHEST 1 VIEW COMPARISON:  February 01, 2022. FINDINGS: Stable cardiomegaly. Bilateral pulmonary edema is noted. Bibasilar atelectasis is noted with small right pleural effusion. Bony thorax is unremarkable. IMPRESSION: Bilateral pulmonary edema and bibasilar atelectasis is noted with small right pleural effusion. Electronically Signed   By: Marijo Conception M.D.   On: 03/06/2022 12:32   ECHOCARDIOGRAM LIMITED  Result Date: 03/08/2022    ECHOCARDIOGRAM LIMITED REPORT   Patient Name:   John [name] Date of Exam: 03/08/2022 Medical Rec #:  [id number]     Height:       67.0 in Accession #:    [id number]    Weight:       188.3 lb Date of Birth:  15-Oct-1941      BSA:          1.971 m Patient Age:    43 years      BP:           134/84 mmHg Patient Gender: M             HR:           87 bpm. Exam Location:  [facility] Procedure: Limited Echo and Intracardiac Opacification Agent Indications:    CHF  History:        Patient has prior history of Echocardiogram examinations, most                 recent 01/26/2022. CHF, CAD and Previous Myocardial Infarction,                 COPD, Arrythmias:Atrial Fibrillation, Signs/Symptoms:Chest Pain;                 Risk Factors:Hypertension, Diabetes and Dyslipidemia.  Sonographer:    Wenda Low Referring Phys: [id number] [place] Istre IMPRESSIONS  1. Limited study.  2. Poor acoustic windows.  3. LVEF is severely depressed with hypokinesis worse in the lateral, anterior and apical  walls . Left ventricular ejection fraction, by estimation, is 25 to 30%. The left ventricle has severely decreased function. The left ventricular internal cavity size was moderately dilated. There is mild left ventricular hypertrophy.  4. Right ventricular systolic function is normal. The right ventricular size is normal.  5. The inferior vena cava is dilated in size with <50% respiratory variability, suggesting right atrial pressure of 15 mmHg. FINDINGS  Left Ventricle: LVEF is severely depressed with hypokinesis worse in the lateral, anterior and apical walls. Left ventricular ejection fraction, by estimation, is 25 to 30%. The left ventricle has severely decreased function. Definity contrast agent was  given IV to delineate the left ventricular endocardial borders. The left ventricular internal cavity size was moderately dilated. There is mild left ventricular hypertrophy. Right Ventricle: The right ventricular size is normal. Right vetricular wall thickness was not assessed. Right ventricular systolic function is normal. Pericardium: Trivial pericardial effusion is present. Mitral Valve: There is mild thickening of the mitral valve leaflet(s). Mild mitral annular calcification. Tricuspid Valve: The tricuspid valve is not assessed. Pulmonic Valve: The pulmonic valve was grossly normal. Aorta: The aortic root and ascending aorta are structurally normal, with no evidence of dilitation. Venous: The inferior vena cava is dilated in size  with less than 50% respiratory variability, suggesting right atrial pressure of 15 mmHg. LEFT VENTRICLE PLAX 2D LVIDd:         5.50 cm LVIDs:         4.80 cm LV PW:         1.10 cm LV IVS:        1.20 cm LVOT diam:     2.00 cm LVOT Area:     3.14 cm  LEFT ATRIUM         Index LA diam:    4.80 cm 2.44 cm/m   AORTA Ao Root diam: 3.20 cm Ao Asc diam:  3.30 cm  SHUNTS Systemic Diam: 2.00 cm Dorris Carnes MD Electronically signed by Dorris Carnes MD Signature Date/Time: 03/08/2022/4:08:59 PM     Final     Orson Eva, DO  [facility]  If 7PM-7AM, please contact night-coverage [email or web address] Password [email or web address] 03/11/2022, 1:39 PM   LOS: 5 days

## 2022-03-11 NOTE — Plan of Care (Signed)
  Problem: Education: Goal: Ability to demonstrate management of disease process will improve Outcome: Progressing Goal: Ability to verbalize understanding of medication therapies will improve Outcome: Progressing Goal: Individualized Educational Video(s) Outcome: Progressing   Problem: Activity: Goal: Capacity to carry out activities will improve Outcome: Progressing   Problem: Cardiac: Goal: Ability to achieve and maintain adequate cardiopulmonary perfusion will improve Outcome: Progressing   Problem: Education: Goal: Knowledge of disease or condition will improve Outcome: Progressing Goal: Knowledge of the prescribed therapeutic regimen will improve Outcome: Progressing Goal: Individualized Educational Video(s) Outcome: Progressing   Problem: Activity: Goal: Ability to tolerate increased activity will improve Outcome: Progressing Goal: Will verbalize the importance of balancing activity with adequate rest periods Outcome: Progressing   Problem: Respiratory: Goal: Ability to maintain a clear airway will improve Outcome: Progressing Goal: Levels of oxygenation will improve Outcome: Progressing Goal: Ability to maintain adequate ventilation will improve Outcome: Progressing   Problem: Education: Goal: Knowledge of General Education information will improve Description Including pain rating scale, medication(s)/side effects and non-pharmacologic comfort measures Outcome: Progressing   Problem: Health Behavior/Discharge Planning: Goal: Ability to manage health-related needs will improve Outcome: Progressing   Problem: Clinical Measurements: Goal: Ability to maintain clinical measurements within normal limits will improve Outcome: Progressing Goal: Will remain free from infection Outcome: Progressing Goal: Diagnostic test results will improve Outcome: Progressing Goal: Respiratory complications will improve Outcome: Progressing Goal: Cardiovascular complication  will be avoided Outcome: Progressing   Problem: Activity: Goal: Risk for activity intolerance will decrease Outcome: Progressing   Problem: Nutrition: Goal: Adequate nutrition will be maintained Outcome: Progressing   Problem: Coping: Goal: Level of anxiety will decrease Outcome: Progressing   Problem: Elimination: Goal: Will not experience complications related to bowel motility Outcome: Progressing Goal: Will not experience complications related to urinary retention Outcome: Progressing   Problem: Pain Managment: Goal: General experience of comfort will improve Outcome: Progressing   Problem: Safety: Goal: Ability to remain free from injury will improve Outcome: Progressing   Problem: Skin Integrity: Goal: Risk for impaired skin integrity will decrease Outcome: Progressing   

## 2022-03-12 DIAGNOSIS — J441 Chronic obstructive pulmonary disease with (acute) exacerbation: Secondary | ICD-10-CM | POA: Diagnosis not present

## 2022-03-12 DIAGNOSIS — I5043 Acute on chronic combined systolic (congestive) and diastolic (congestive) heart failure: Secondary | ICD-10-CM | POA: Diagnosis not present

## 2022-03-12 DIAGNOSIS — J9621 Acute and chronic respiratory failure with hypoxia: Secondary | ICD-10-CM | POA: Diagnosis not present

## 2022-03-12 DIAGNOSIS — N184 Chronic kidney disease, stage 4 (severe): Secondary | ICD-10-CM | POA: Diagnosis not present

## 2022-03-12 LAB — GLUCOSE, CAPILLARY
Glucose-Capillary: 147 mg/dL — ABNORMAL HIGH (ref 70–99)
Glucose-Capillary: 195 mg/dL — ABNORMAL HIGH (ref 70–99)
Glucose-Capillary: 260 mg/dL — ABNORMAL HIGH (ref 70–99)
Glucose-Capillary: 212 mg/dL — ABNORMAL HIGH (ref 70–99)

## 2022-03-12 LAB — CBC
HCT: 28.2 % — ABNORMAL LOW (ref 39.0–52.0)
Hemoglobin: 8.7 g/dL — ABNORMAL LOW (ref 13.0–17.0)
MCH: 27.8 pg (ref 26.0–34.0)
MCHC: 30.9 g/dL (ref 30.0–36.0)
MCV: 90.1 fL (ref 80.0–100.0)
Platelets: 200 10*3/uL (ref 150–400)
RBC: 3.13 MIL/uL — ABNORMAL LOW (ref 4.22–5.81)
RDW: 16.2 % — ABNORMAL HIGH (ref 11.5–15.5)
WBC: 8.4 10*3/uL (ref 4.0–10.5)
nRBC: 0.5 % — ABNORMAL HIGH (ref 0.0–0.2)

## 2022-03-12 LAB — BASIC METABOLIC PANEL
Anion gap: 9 (ref 5–15)
BUN: 85 mg/dL — ABNORMAL HIGH (ref 8–23)
CO2: 31 mmol/L (ref 22–32)
Calcium: 8.8 mg/dL — ABNORMAL LOW (ref 8.9–10.3)
Chloride: 96 mmol/L — ABNORMAL LOW (ref 98–111)
Creatinine, Ser: 2.23 mg/dL — ABNORMAL HIGH (ref 0.61–1.24)
GFR, Estimated: 29 mL/min — ABNORMAL LOW (ref >60)
Glucose, Bld: 155 mg/dL — ABNORMAL HIGH (ref 70–99)
Potassium: 4.4 mmol/L (ref 3.5–5.1)
Sodium: 136 mmol/L (ref 135–145)

## 2022-03-12 LAB — MAGNESIUM: Magnesium: 2.5 mg/dL — ABNORMAL HIGH (ref 1.7–2.4)

## 2022-03-12 MED ORDER — PREDNISONE 20 MG PO TABS
40.0000 mg | ORAL_TABLET | Freq: Every day | ORAL | Status: DC
Start: 2022-03-13 — End: 2022-03-13
  Administered 2022-03-13: 40 mg via ORAL
  Filled 2022-03-12: qty 2

## 2022-03-12 MED ORDER — IPRATROPIUM-ALBUTEROL 0.5-2.5 (3) MG/3ML IN SOLN
3.0000 mL | Freq: Three times a day (TID) | RESPIRATORY_TRACT | Status: DC
  Administered 2022-03-13 (×2): 3 mL via RESPIRATORY_TRACT
  Filled 2022-03-12: qty 3

## 2022-03-12 MED ORDER — INSULIN ASPART 100 UNIT/ML IJ SOLN
12.0000 [IU] | Freq: Three times a day (TID) | INTRAMUSCULAR | Status: DC
  Administered 2022-03-12 – 2022-03-13 (×3): 12 [IU] via SUBCUTANEOUS

## 2022-03-12 NOTE — Progress Notes (Signed)
Physical Therapy Treatment Patient Details Name: John [name] MRN: [id number] DOB: February 15, 1941 Today's Date: 03/12/2022   History of Present Illness John [name] is a 81 y.o. 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  Patient reports symptoms has been ongoing for the last 4 days now and despite visit to his primary care physician who adjust his diuretics to double the dose for 3 days he had not noticed any improvement in symptoms and in fact has noticing even more shortness of breath.  On the day of admission EMS was contacted and on arrival patient was found with increased work of breathing and requiring up to 15 L without significant improvement.  Patient was transferred to the emergency department for further evaluation and management.  Patient reports orthopnea, sudden increase swelling in his lower extremities and also nonproductive coughing spells.  He denies chest pain, nausea, vomiting, dysuria, hematuria, melena, hematochezia, abdominal pain, or any other complaints.    PT Comments    Patient agreeable to participating in PT session today. Patient performed bed mobility and transfers well. With transfers, patient uses Rollator at home. With RW use, patient requires cues for sequencing of steps and placement of hands for safe use. Patient able to ambulate a longer distances with RW today and performed seated therapeutic exercises with rest breaks when needed.  Patient tolerated treatment well today and agreed to sitting in chair after therapy. Nursing notified.  Patient would continue to benefit from skilled physical therapy in current environment and next venue to continue return to prior function and increase  strength, endurance, balance, coordination, and functional mobility and gait skills.    Recommendations for follow up therapy are one component of a multi-disciplinary discharge planning process, led by the attending physician.  Recommendations may be updated based on patient status, additional functional criteria and insurance authorization.  Follow Up Recommendations  Home health PT     Assistance Recommended at Discharge PRN  Patient can return home with the following A little help with walking and/or transfers;Help with stairs or ramp for entrance;Assist for transportation;A little help with bathing/dressing/bathroom;Assistance with cooking/housework   Equipment Recommendations  None recommended by PT    Recommendations for Other Services       Precautions / Restrictions Precautions Precautions: Fall Restrictions Weight Bearing Restrictions: No     Mobility  Bed Mobility Overal bed mobility: Independent     General bed mobility comments: Patient able to perform supine to sit and sit to supine bed mobility in appropriate amount of time and with no assistance.    Transfers Overall transfer level: Modified independent Equipment used: Rolling walker (2 wheels)     General transfer comment: increased time; did require cues for sequencing of steps and placement of hands for use of RW; uses Rollator at home.    Ambulation/Gait Ambulation/Gait assistance: Modified independent (Device/Increase time), Supervision Gait Distance (Feet): 100 Feet Assistive device: Rolling walker (2 wheels) Gait Pattern/deviations: Step-to pattern, Decreased step length - right, Decreased step length - left, Decreased stride length Gait velocity: decreased     General Gait Details: somewhat slow cadence using RW; reports uses Rollator at home. Limited by fatigue; on 4 LPM O2.   Stairs             Emergency planning/management officer  Modified Rankin (Stroke Patients Only)       Balance  Overall balance assessment: Needs assistance Sitting-balance support: Bilateral upper extremity supported, Feet supported Sitting balance-Leahy Scale: Good Sitting balance - Comments: at edge of bed   Standing balance support: Bilateral upper extremity supported, During functional activity, Reliant on assistive device for balance Standing balance-Leahy Scale: Fair Standing balance comment: with RW        Cognition Arousal/Alertness: Awake/alert Behavior During Therapy: WFL for tasks assessed/performed Overall Cognitive Status: Within Functional Limits for tasks assessed        Exercises General Exercises - Upper Extremity Shoulder Flexion: AROM, Strengthening, Both, 10 reps, Seated General Exercises - Lower Extremity Long Arc Quad: AROM, Strengthening, Both, 10 reps, Seated Hip Flexion/Marching: AROM, Strengthening, Both, 10 reps, Seated Toe Raises: AROM, Strengthening, Both, 10 reps, Seated Heel Raises: AROM, Strengthening, Both, 10 reps, Seated    General Comments        Pertinent Vitals/Pain Pain Assessment Pain Assessment: No/denies pain    Home Living       Prior Function            PT Goals (current goals can now be found in the care plan section) Acute Rehab PT Goals Patient Stated Goal: return home PT Goal Formulation: With patient Time For Goal Achievement: 03/23/22 Potential to Achieve Goals: Good Progress towards PT goals: Progressing toward goals    Frequency    Min 3X/week      PT Plan Current plan remains appropriate       AM-PAC PT "6 Clicks" Mobility   Outcome Measure  Help needed turning from your back to your side while in a flat bed without using bedrails?: None Help needed moving from lying on your back to sitting on the side of a flat bed without using bedrails?: None Help needed moving to and from a bed to a chair (including a wheelchair)?: A Little Help needed standing up from a chair using your arms (e.g., wheelchair or  bedside chair)?: A Little Help needed to walk in hospital room?: A Little Help needed climbing 3-5 steps with a railing? : A Lot 6 Click Score: 19    End of Session Equipment Utilized During Treatment: Oxygen Activity Tolerance: Patient tolerated treatment well;Patient limited by fatigue;No increased pain Patient left: in chair;with call bell/phone within reach Nurse Communication: Mobility status PT Visit Diagnosis: Unsteadiness on feet (R26.81);Other abnormalities of gait and mobility (R26.89);Muscle weakness (generalized) (M62.81)     Time: 1017-5102 PT Time Calculation (min) (ACUTE ONLY): 27 min  Charges:  $Therapeutic Exercise: 8-22 mins $Therapeutic Activity: 8-22 mins                     Floria Raveling. Hartnett-Rands, MS, PT [facility] [place] [phone]  Pamala Hurry  Hartnett-Rands 03/12/2022, 11:36 AM

## 2022-03-12 NOTE — Progress Notes (Signed)
   03/12/22 0944  ReDS Vest / Clip  Station Marker D  Ruler Value 35  ReDS Value Range (!) > 40  ReDS Actual Value 41  Anatomical Comments  (patient sitting in bed upright)    ReDs Value- 41%

## 2022-03-12 NOTE — Progress Notes (Addendum)
Progress Note  Patient Name: John [name] Date of Encounter: 03/12/2022  [facility] Cardiologist: Kirk Ruths, MD    Subjective   Breathing easier  Inpatient Medications    Scheduled Meds:  allopurinol  300 mg Oral Daily   apixaban  2.5 mg Oral BID   arformoterol  15 mcg Nebulization BID   budesonide (PULMICORT) nebulizer solution  0.5 mg Nebulization Q12H   chlorhexidine  15 mL Mouth Rinse BID   Chlorhexidine Gluconate Cloth  6 each Topical Daily   clopidogrel  75 mg Oral Daily   furosemide  80 mg Intravenous BID   gabapentin  300 mg Oral QHS   hydrALAZINE  25 mg Oral BID   insulin aspart  0-15 Units Subcutaneous TID WC   insulin aspart  10 Units Subcutaneous TID WC   insulin detemir  15 Units Subcutaneous BID   ipratropium-albuterol  3 mL Nebulization Q6H   isosorbide dinitrate  20 mg Oral BID   mouth rinse  15 mL Mouth Rinse q12n4p   metoprolol succinate  50 mg Oral Daily   multivitamin with minerals  1 tablet Oral Daily   pantoprazole  40 mg Oral Q breakfast   predniSONE  50 mg Oral Q breakfast   rosuvastatin  10 mg Oral QPM   sertraline  25 mg Oral QPM   sodium chloride flush  3 mL Intravenous Q12H   tamsulosin  0.4 mg Oral Daily   Continuous Infusions:  sodium chloride     PRN Meds: sodium chloride, acetaminophen **OR** acetaminophen, albuterol, ALPRAZolam, HYDROcodone-acetaminophen, ondansetron **OR** ondansetron (ZOFRAN) IV, sodium chloride flush   Vital Signs    Vitals:   03/11/22 2317 03/12/22 0150 03/12/22 0435 03/12/22 0622  BP:   (!) 147/98   Pulse: 82  (!) 45   Resp: 16     Temp:   (!) 97.4 F (36.3 C)   TempSrc:   Oral   SpO2: 96% 96% 99%   Weight:    82.9 kg  Height:        Intake/Output Summary (Last 24 hours) at 03/12/2022 0802 Last data filed at 03/12/2022 0450 Gross per 24 hour  Intake 3 ml  Output 1975 ml  Net -1972 ml      03/12/2022    6:22 AM 03/11/2022    5:00 AM 03/10/2022    4:15 AM  Last 3 Weights  Weight (lbs)  182 lb 12.8 oz 185 lb 6.4 oz 184 lb 8.4 oz  Weight (kg) 82.918 kg 84.097 kg 83.7 kg      Telemetry    NSR with freq PVC's, bigeminy, couplets - Personally Reviewed  ECG       Physical Exam    GEN: No acute distress.   Neck: No JVD Cardiac: RRR, no murmurs, rubs, or gallops.  Respiratory: decreased breath sounds with crackles at bases bilaterally. GI: Soft, nontender, non-distended  MS: No edema; No deformity. Neuro:  Nonfocal  Psych: Normal affect   Labs    High Sensitivity Troponin:  No results for input(s): TROPONINIHS in the last 720 hours.   Chemistry Recent Labs  Lab 03/06/22 1214 03/06/22 2237 03/08/22 0512 03/09/22 0352 03/10/22 0058 03/11/22 0413 03/12/22 0308  NA 139   < > 133* 136 137 138 136  K 4.1   < > 4.4 4.3 3.8 5.1 4.4  CL 100   < > 95* 96* 97* 98 96*  CO2 31   < > 28 30 30 31 31   GLUCOSE 208*   < >  345* 229* 172* 213* 155*  BUN 45*   < > 62* 70* 80* 83* 85*  CREATININE 2.13*   < > 2.48* 2.32* 2.13* 2.22* 2.23*  CALCIUM 8.9   < > 8.6* 9.0 8.6* 8.8* 8.8*  MG 1.9   < > 2.1 2.3  --   --  2.5*  PROT 6.8  --   --   --   --   --   --   ALBUMIN 3.5  --   --   --   --   --   --   AST 12*  --   --   --   --   --   --   ALT 11  --   --   --   --   --   --   ALKPHOS 61  --   --   --   --   --   --   BILITOT 0.9  --   --   --   --   --   --   GFRNONAA 30*   < > 25* 27* 30* 29* 29*  ANIONGAP 8   < > 10 10 10 9 9    < > = values in this interval not displayed.    Lipids No results for input(s): CHOL, TRIG, HDL, LABVLDL, LDLCALC, CHOLHDL in the last 168 hours.  Hematology Recent Labs  Lab 03/06/22 1214 03/11/22 0413 03/12/22 0308  WBC 11.4* 10.4 8.4  RBC 3.29* 3.16* 3.13*  HGB 9.5* 8.9* 8.7*  HCT 31.0* 28.7* 28.2*  MCV 94.2 90.8 90.1  MCH 28.9 28.2 27.8  MCHC 30.6 31.0 30.9  RDW 17.1* 16.3* 16.2*  PLT 182 196 200   Thyroid No results for input(s): TSH, FREET4 in the last 168 hours.  BNP Recent Labs  Lab 03/06/22 1214 03/10/22 0058  BNP  1,020.0* 817.0*    DDimer No results for input(s): DDIMER in the last 168 hours.   Radiology    No results found.  Cardiac Studies    Limited echo 02/2022 IMPRESSIONS     1. Limited study.   2. Poor acoustic windows.   3. LVEF is severely depressed with hypokinesis worse in the lateral,  anterior and apical walls . Left ventricular ejection fraction, by  estimation, is 25 to 30%. The left ventricle has severely decreased  function. The left ventricular internal cavity  size was moderately dilated. There is mild left ventricular hypertrophy.   4. Right ventricular systolic function is normal. The right ventricular  size is normal.   5. The inferior vena cava is dilated in size with <50% respiratory  variability, suggesting right atrial pressure of 15 mmHg.   Limited Echo: 01/26/2022 IMPRESSIONS     1. Left ventricular ejection fraction, by estimation, is 30 to 35%. The  left ventricle has moderately decreased function. The left ventricle  demonstrates global hypokinesis. The left ventricular internal cavity size  was mildly to moderately dilated.  There is mild left ventricular hypertrophy.   2. Right ventricular systolic function is normal. The right ventricular  size is normal.   3. The inferior vena cava is dilated in size with >50% respiratory  variability, suggesting right atrial pressure of 8 mmHg.   Comparison(s): The left ventricular function is worsened.     Patient Profile     81 y.o. male with a hx of  CAD (s/p multiple stents with most recent cath in 2013 showing EF of 40% with DESx2 to  LAD, NSTEMI in 12/2021 with EF of 35% and medical management recommended given his AKI), HFrEF (EF 40-45% in 08/2021, at 35% in 12/2021, 30-35% in 01/2022), paroxysmal atrial fibrillation, Type 2 DM, HTN, HLD, PAD (s/p right fem-pop bypass at [facility] in 05/2018), Stage 3-4 CKD, COPD, OSA and prior CVA who is currently admitted for acute hypoxic respiratory failure. Cardiology  consulted for CHF.   Assessment & Plan     1. Acute on Chronic Hypoxic Respiratory Failure - Multifactorial in the setting of acute COPD and CHF exacerbations. Management of CHF as outlined below and COPD is being managed by the admitting team.    2. Acute on chronic HFrEF EF 25-30% on limited echo 03/08/22 - on lasix 80 IV BID, negative 7.6 L since admission, 2.1 past 24 hr. Still mildly hypervolemic on exam, would continue IV lasix today, may be able to transition to PO torsemide tomorrow - BNP 817 03/10/22 - May need to tolerate a higher creatinine to appropriately diurese him.  - weight is stable; 82.918 kg today  - Continue Toprol-XL, Hydralazine and Isordil for his cardiomyopathy. No ACE-I/ARB/ARNI/SGLT2 inhibitor given his renal function.    3. CAD - NSTEMI in 12/2021 with EF of 30-35% and medical management recommended given his AKI. Limited echo 03/08/22 EF 25-30% -- Continue medical therapy with Plavix, Crestor, Isordil and Toprol-XL.    4. Paroxysmal Atrial Fibrillation  - presently in SR;  -  Continue Toprol-XL 50mg  daily for rate-control and Eliquis 2.5mg  BID for anticoagulation (age and Creatinine)   5. HLD - Continue Crestor 10mg  daily. LDL was at 29 in 12/2021.   6. Stage 3-4 CKD - His creatinine was at 2.13 on admission which is close to his baseline. 2.23 today    7. Anemia-Hgb down 8.7-per primary team.   8. Frequent PVCs Maintain K>4, Mg>2  For questions or updates, please contact [place] Please consult [email or web address] for contact info under        Signed, Ermalinda Barrios, PA-C  03/12/2022, 8:02 AM     Patient seen and examined.  Agree with above documentation.  On exam, patient is alert and oriented, regular rate and rhythm, no murmurs, crackles in left base, no LE edema, + JVD.  Net -2.1 L yesterday, -7.6 L on admission.  Creatinine stable at 2.2.  BP 147/98 this morning.  He reports dyspnea is improving.  Remains mildly hypervolemic on exam. Continue  IV lasix today, may be able to transition to PO torsemide tomorrow.  Donato Heinz, MD

## 2022-03-12 NOTE — Progress Notes (Signed)
PROGRESS NOTE  Jihaad Bruschi ZOX:[id number] DOB: 04/28/1941 DOA: 03/06/2022 PCP: [name], DO  Brief History:  81 year old male with a history of recent NSTEMI 12/2021, diabetes mellitus type 2, hyperlipidemia, hypertension, paroxysmal atrial fibrillation, GERD, BPH, chronic respiratory failure on 4 L, and CKD stage 4 presenting with shortness of breath for about 1 week.  He stated that his legs have not been significantly out more swollen but had been having increasing abdominal girth and orthopnea type symptoms.  He quit smoking over 40 years ago.  Upon EMS arrival, the patient was noted to have some respiratory distress and placed on 15 L [place].  He was placed on BiPAP in the emergency department.  He states that he had been taking double his usual dosage of torsemide for the past 3 days prior to admission without improvement.  He normally takes torsemide 40 mg daily. Notably, the patient had a recent hospital admission from 01/26/2022 to 02/05/2022 for acute on chronic respiratory failure due to CHF exacerbation and COPD exacerbation.  His discharge weight was 184 pounds.  He was discharged home with torsemide 40 mg daily. He was also recently admitted to the hospital from 01/08/2022 to 01/10/2022 for acute on chronic systolic and diastolic CHF.  He was discharged home with torsemide 20 mg daily. In the ED, the patient was afebrile and hemodynamically stable with oxygen saturation 98% on BiPAP.  BMP showed sodium 134 and serum creatinine 2.13.  WBC 11.4, hemoglobin 9.5, platelets 282,000.  LFTs were unremarkable.  Chest x-ray showed pulmonary edema.  EKG shows sinus rhythm with bigeminy.  The patient was started on IV steroids, IV furosemide, and bronchodilators.  Cardiology was consulted to assist with management.    Assessment and Plan: * Acute on chronic respiratory failure with hypoxia (HCC) -due to COPD and CHF exacerbation. -chronically on 4L [place] -experiencing increased work of  breathing and borderline saturation while using 15 L of oxygen.  He has been placed on BiPAP and admitted to stepdown bed. -IV diuresis has been started; will continue the use of isosorbide and metoprolol.  -Given chronic renal failure not a candidate for ACE or ARB regimen. -Cardiology consulted -ReDS protocol requested -Daily weights, low-sodium diet, strict intake and output, follow clinical response. -personally reviewed chest x-ray with interstitial edema  COPD exacerbation ([place]) Continue Pulmicort and Brovana Continue DuoNebs Continue IV Solu-Medrol>>de-escalated to prednisone and wean off Continues to require intermittent BiPAP>>now only night time CPAP  Acute on chronic combined systolic and diastolic CHF (congestive heart failure) ([place]) 12/25/21 echo EF 35%, global HK, grade 2 DD 01/26/22 Echo  EF 30-35%, global HK Appreciate cardiology consult Continue IV furosemide to 80 mg bid Daily weights--not accurate Accurate I's and O's-=-incomplete 01/30/22 High Res CT chest>>Persistent bilateral bronchial wall thickening and interlobularseptal thickening, likely due to pulmonary edema. --obtain ReDS reading 5/18--50%>>41% --limited echo--EF 25-30%; trivial pericardial eff --remains clinically fluid overloaded  CKD (chronic kidney disease) stage 4, GFR 15-29 ml/min (HCC) Baseline creatinine 2.1-2.4 Will need to tolerate increase in serum creatinine for euvolemia  Paroxysmal atrial fibrillation (HCC) -Continue apixaban Continue metoprolol succinate Currently in sinus rhythm   Anxiety --encouraged patient to ask for home xanax  Acute urinary retention Patient had difficulty during last hospitalization requiring Foley catheter Check bladder scan Place Foley if continues to have retention  BPH (benign prostatic hyperplasia) -Continue Flomax.  Type 2 diabetes mellitus with hyperglycemia (HCC) - Recent A1c (December 24, 2021) 7.1 -Holding oral  hypoglycemic agents while  inpatient -increase levemir to 15 units bid -increase premeal insulin to 12 units -Anticipating elevated CBGs/fluctuation with the use of his steroids  Essential hypertension Continue metoprolol succinate Hydralazine added -not a candidate for ACEi or ARB due to CKD  Peripheral vascular disease ([place]) - He is s/p right fem-pop bypass at [facility] in 05/2018. Remains on Plavix and statin therapy.     Mixed hyperlipidemia - Continue Crestor.  CAD (coronary artery disease) of artery bypass graft - No chest pain -No acute ischemic changes appreciated on EKG -Continue metoprolol, Imdur, Plavix and statin.      Family Communication:  no Family at bedside   Consultants:  cardiology   Code Status:  FULL    DVT Prophylaxis:  apixaban     Procedures: As Listed in Progress Note Above   Antibiotics: None          Subjective: Patient denies fevers, chills, headache, chest pain, dyspnea, nausea, vomiting, diarrhea, abdominal pain, dysuria, hematuria, hematochezia, and melena.    Objective: Vitals:   03/12/22 0622 03/12/22 0806 03/12/22 1403 03/12/22 1406  BP:   125/69   Pulse:   77   Resp:      Temp:      TempSrc:      SpO2:  98% 96% 96%  Weight: 82.9 kg     Height:        Intake/Output Summary (Last 24 hours) at 03/12/2022 1639 Last data filed at 03/12/2022 1619 Gross per 24 hour  Intake 243 ml  Output 2750 ml  Net -2507 ml   Weight change: -1.179 kg Exam:  General:  Pt is alert, follows commands appropriately, not in acute distress HEENT: No icterus, No thrush, No neck mass, /AT Cardiovascular: RRR, S1/S2, no rubs, no gallops Respiratory: bibasilar crackles. No wheeze Abdomen: Soft/+BS, non tender, non distended, no guarding Extremities: No edema, No lymphangitis, No petechiae, No rashes, no synovitis   Data Reviewed: I have personally reviewed following labs and imaging studies Basic Metabolic Panel: Recent Labs  Lab 03/06/22 1214  03/06/22 2237 03/07/22 0410 03/08/22 0512 03/09/22 0352 03/10/22 0058 03/11/22 0413 03/12/22 0308  NA 139  --    < > 133* 136 137 138 136  K 4.1  --    < > 4.4 4.3 3.8 5.1 4.4  CL 100  --    < > 95* 96* 97* 98 96*  CO2 31  --    < > 28 30 30 31 31   GLUCOSE 208*  --    < > 345* 229* 172* 213* 155*  BUN 45*  --    < > 62* 70* 80* 83* 85*  CREATININE 2.13*  --    < > 2.48* 2.32* 2.13* 2.22* 2.23*  CALCIUM 8.9  --    < > 8.6* 9.0 8.6* 8.8* 8.8*  MG 1.9 2.1  --  2.1 2.3  --   --  2.5*  PHOS 3.8  --   --   --   --   --   --   --    < > = values in this interval not displayed.   Liver Function Tests: Recent Labs  Lab 03/06/22 1214  AST 12*  ALT 11  ALKPHOS 61  BILITOT 0.9  PROT 6.8  ALBUMIN 3.5   No results for input(s): LIPASE, AMYLASE in the last 168 hours. No results for input(s): AMMONIA in the last 168 hours. Coagulation Profile: No results for input(s): INR, PROTIME  in the last 168 hours. CBC: Recent Labs  Lab 03/06/22 1214 03/11/22 0413 03/12/22 0308  WBC 11.4* 10.4 8.4  NEUTROABS 9.9*  --   --   HGB 9.5* 8.9* 8.7*  HCT 31.0* 28.7* 28.2*  MCV 94.2 90.8 90.1  PLT 182 196 200   Cardiac Enzymes: No results for input(s): CKTOTAL, CKMB, CKMBINDEX, TROPONINI in the last 168 hours. BNP: Invalid input(s): POCBNP CBG: Recent Labs  Lab 03/11/22 1623 03/11/22 2157 03/12/22 0736 03/12/22 1139 03/12/22 1612  GLUCAP 169* 258* 147* 195* 260*   HbA1C: No results for input(s): HGBA1C in the last 72 hours. Urine analysis:    Component Value Date/Time   COLORURINE YELLOW 03/08/2022 0728   APPEARANCEUR CLEAR 03/08/2022 0728   LABSPEC 1.011 03/08/2022 0728   PHURINE 6.0 03/08/2022 0728   GLUCOSEU 150 (A) 03/08/2022 0728   HGBUR NEGATIVE 03/08/2022 0728   BILIRUBINUR NEGATIVE 03/08/2022 0728   KETONESUR NEGATIVE 03/08/2022 0728   PROTEINUR NEGATIVE 03/08/2022 0728   NITRITE NEGATIVE 03/08/2022 0728   LEUKOCYTESUR NEGATIVE 03/08/2022 0728   Sepsis  Labs: @LABRCNTIP (procalcitonin:4,lacticidven:4) ) Recent Results (from the past 240 hour(s))  MRSA Next Gen by PCR, Nasal     Status: None   Collection Time: 03/06/22  7:13 PM   Specimen: Nasal Mucosa; Nasal Swab  Result Value Ref Range Status   MRSA by PCR Next Gen NOT DETECTED NOT DETECTED Final    Comment: (NOTE) The GeneXpert MRSA Assay (FDA approved for NASAL specimens only), is one component of a comprehensive MRSA colonization surveillance program. It is not intended to diagnose MRSA infection nor to guide or monitor treatment for MRSA infections. Test performance is not FDA approved in patients less than 70 years old. Performed at [facility], [address] [place], [place] [address]   Urine Culture     Status: Abnormal   Collection Time: 03/08/22 11:15 AM   Specimen: Urine, Clean Catch  Result Value Ref Range Status   Specimen Description   Final    URINE, CLEAN CATCH Performed at [facility], [address] [place], [place] [address]    Special Requests   Final    NONE Performed at [facility], [address] [place],  [address]    Culture >=100,000 COLONIES/mL ENTEROCOCCUS FAECALIS (A)  Final   Report Status 03/10/2022 FINAL  Final   Organism ID, Bacteria ENTEROCOCCUS FAECALIS (A)  Final      Susceptibility   Enterococcus faecalis - MIC*    AMPICILLIN <=2 SENSITIVE Sensitive     NITROFURANTOIN <=16 SENSITIVE Sensitive     VANCOMYCIN 1 SENSITIVE Sensitive     * >=100,000 COLONIES/mL ENTEROCOCCUS FAECALIS     Scheduled Meds:  allopurinol  300 mg Oral Daily   apixaban  2.5 mg Oral BID   arformoterol  15 mcg Nebulization BID   budesonide (PULMICORT) nebulizer solution  0.5 mg Nebulization Q12H   chlorhexidine  15 mL Mouth Rinse BID   Chlorhexidine Gluconate Cloth  6 each Topical Daily   clopidogrel  75 mg Oral Daily   furosemide  80 mg Intravenous BID   gabapentin  300 mg Oral QHS   hydrALAZINE  25 mg Oral BID   insulin aspart  0-15 Units  Subcutaneous TID WC   insulin aspart  12 Units Subcutaneous TID WC   insulin detemir  15 Units Subcutaneous BID   ipratropium-albuterol  3 mL Nebulization Q6H   isosorbide dinitrate  20 mg Oral BID   mouth rinse  15  mL Mouth Rinse q12n4p   metoprolol succinate  50 mg Oral Daily   multivitamin with minerals  1 tablet Oral Daily   pantoprazole  40 mg Oral Q breakfast   [START ON 03/13/2022] predniSONE  40 mg Oral Q breakfast   rosuvastatin  10 mg Oral QPM   sertraline  25 mg Oral QPM   sodium chloride flush  3 mL Intravenous Q12H   tamsulosin  0.4 mg Oral Daily   Continuous Infusions:  sodium chloride      Procedures/Studies: DG Chest Port 1 View  Result Date: 03/06/2022 CLINICAL DATA:  Shortness of breath. EXAM: PORTABLE CHEST 1 VIEW COMPARISON:  February 01, 2022. FINDINGS: Stable cardiomegaly. Bilateral pulmonary edema is noted. Bibasilar atelectasis is noted with small right pleural effusion. Bony thorax is unremarkable. IMPRESSION: Bilateral pulmonary edema and bibasilar atelectasis is noted with small right pleural effusion. Electronically Signed   By: Marijo Conception M.D.   On: 03/06/2022 12:32   ECHOCARDIOGRAM LIMITED  Result Date: 03/08/2022    ECHOCARDIOGRAM LIMITED REPORT   Patient Name:   John [name] Date of Exam: 03/08/2022 Medical Rec #:  [id number]     Height:       67.0 in Accession #:    [id number]    Weight:       188.3 lb Date of Birth:  1940-12-04      BSA:          1.971 m Patient Age:    50 years      BP:           134/84 mmHg Patient Gender: M             HR:           87 bpm. Exam Location:  [facility] Procedure: Limited Echo and Intracardiac Opacification Agent Indications:    CHF  History:        Patient has prior history of Echocardiogram examinations, most                 recent 01/26/2022. CHF, CAD and Previous Myocardial Infarction,                 COPD, Arrythmias:Atrial Fibrillation, Signs/Symptoms:Chest Pain;                 Risk Factors:Hypertension, Diabetes and  Dyslipidemia.  Sonographer:    Wenda Low Referring Phys: [phone] Caitlin Ainley IMPRESSIONS  1. Limited study.  2. Poor acoustic windows.  3. LVEF is severely depressed with hypokinesis worse in the lateral, anterior and apical walls . Left ventricular ejection fraction, by estimation, is 25 to 30%. The left ventricle has severely decreased function. The left ventricular internal cavity size was moderately dilated. There is mild left ventricular hypertrophy.  4. Right ventricular systolic function is normal. The right ventricular size is normal.  5. The inferior vena cava is dilated in size with <50% respiratory variability, suggesting right atrial pressure of 15 mmHg. FINDINGS  Left Ventricle: LVEF is severely depressed with hypokinesis worse in the lateral, anterior and apical walls. Left ventricular ejection fraction, by estimation, is 25 to 30%. The left ventricle has severely decreased function. Definity contrast agent was  given IV to delineate the left ventricular endocardial borders. The left ventricular internal cavity size was moderately dilated. There is mild left ventricular hypertrophy. Right Ventricle: The right ventricular size is normal. Right vetricular wall thickness was not assessed. Right ventricular systolic function is normal. Pericardium: Trivial pericardial effusion is  present. Mitral Valve: There is mild thickening of the mitral valve leaflet(s). Mild mitral annular calcification. Tricuspid Valve: The tricuspid valve is not assessed. Pulmonic Valve: The pulmonic valve was grossly normal. Aorta: The aortic root and ascending aorta are structurally normal, with no evidence of dilitation. Venous: The inferior vena cava is dilated in size with less than 50% respiratory variability, suggesting right atrial pressure of 15 mmHg. LEFT VENTRICLE PLAX 2D LVIDd:         5.50 cm LVIDs:         4.80 cm LV PW:         1.10 cm LV IVS:        1.20 cm LVOT diam:     2.00 cm LVOT Area:     3.14 cm  LEFT  ATRIUM         Index LA diam:    4.80 cm 2.44 cm/m   AORTA Ao Root diam: 3.20 cm Ao Asc diam:  3.30 cm  SHUNTS Systemic Diam: 2.00 cm Dorris Carnes MD Electronically signed by Dorris Carnes MD Signature Date/Time: 03/08/2022/4:08:59 PM    Final     Orson Eva, DO  [facility]  If 7PM-7AM, please contact night-coverage [email or web address] Password [facility] 03/12/2022, 4:39 PM   LOS: 6 days

## 2022-03-12 NOTE — Discharge Summary (Signed)
Physician Discharge Summary   Patient: John [name] MRN: [id number] DOB: 08/12/1941  Admit date:     03/06/2022  Discharge date: 03/13/2022  Discharge Physician: Shanon Brow Charmion Hapke   PCP: [name], DO   Recommendations at discharge:   Please follow up with primary care provider within 1-2 weeks  Please repeat BMP and CBC in one week    Hospital Course: 81 year old male with a history of recent NSTEMI 12/2021, diabetes mellitus type 2, hyperlipidemia, hypertension, paroxysmal atrial fibrillation, GERD, BPH, chronic respiratory failure on 4 L, and CKD stage 4 presenting with shortness of breath for about 1 week.  He stated that his legs have not been significantly out more swollen but had been having increasing abdominal girth and orthopnea type symptoms.  He quit smoking over 40 years ago.  Upon EMS arrival, the patient was noted to have some respiratory distress and placed on 15 L [place].  He was placed on BiPAP in the emergency department.  He states that he had been taking double his usual dosage of torsemide for the past 3 days prior to admission without improvement.  He normally takes torsemide 40 mg daily. Notably, the patient had a recent hospital admission from 01/26/2022 to 02/05/2022 for acute on chronic respiratory failure due to CHF exacerbation and COPD exacerbation.  His discharge weight was 184 pounds.  He was discharged home with torsemide 40 mg daily. He was also recently admitted to the hospital from 01/08/2022 to 01/10/2022 for acute on chronic systolic and diastolic CHF.  He was discharged home with torsemide 20 mg daily. In the ED, the patient was afebrile and hemodynamically stable with oxygen saturation 98% on BiPAP.  BMP showed sodium 134 and serum creatinine 2.13.  WBC 11.4, hemoglobin 9.5, platelets 282,000.  LFTs were unremarkable.  Chest x-ray showed pulmonary edema.  EKG shows sinus rhythm with bigeminy.  The patient was started on IV steroids, IV furosemide, and bronchodilators.   Cardiology was consulted to assist with management.  Assessment and Plan: * Acute on chronic respiratory failure with hypoxia (HCC) -due to COPD and CHF exacerbation. -chronically on 4L [place]>>d/c back home with 4L [place] -experiencing increased work of breathing and borderline saturation while using 15 L of oxygen.  He has been placed on BiPAP and admitted to stepdown bed. -IV diuresis has been started; will continue the use of isosorbide and metoprolol.  -Given chronic renal failure not a candidate for ACE or ARB regimen. -Cardiology consulted -ReDS protocol requested -Daily weights, low-sodium diet, strict intake and output, follow clinical response. -personally reviewed chest x-ray with interstitial edema  COPD exacerbation ([place]) Continue Pulmicort and Brovana Continue DuoNebs Continue IV Solu-Medrol>>de-escalated to prednisone and wean off Continues to require intermittent BiPAP>>now only night time CPAP Finished one week steroids during hospitalization  Acute on chronic combined systolic and diastolic CHF (congestive heart failure) ([place]) 12/25/21 echo EF 35%, global HK, grade 2 DD 01/26/22 Echo  EF 30-35%, global HK Appreciate cardiology consult Continue IV furosemide to 80 mg bid Daily weights--not accurate Accurate I's and O's-=-incomplete 01/30/22 High Res CT chest>>Persistent bilateral bronchial wall thickening and interlobularseptal thickening, likely due to pulmonary edema. --obtain ReDS reading 5/18--50%>>41% --limited echo--EF 25-30%; trivial pericardial eff Plan to d/c home with torsemide 40 mg bid  CKD (chronic kidney disease) stage 4, GFR 15-29 ml/min (HCC) Baseline creatinine 2.1-2.4 Will need to tolerate increase in serum creatinine for euvolemia Serum creatinine  Paroxysmal atrial fibrillation (HCC) -Continue apixaban Continue metoprolol succinate Currently in sinus rhythm  Anxiety --encouraged patient to ask for home xanax  Acute urinary retention Patient had  difficulty during last hospitalization requiring Foley catheter Check bladder scan Place Foley if continues to have retention  BPH (benign prostatic hyperplasia) -Continue Flomax.  Type 2 diabetes mellitus with hyperglycemia (HCC) - Recent A1c (December 24, 2021) 7.1 -Holding oral hypoglycemic agents while inpatient -increase levemir to 15 units bid -increase premeal insulin to 12 units -Anticipating elevated CBGs/fluctuation with the use of his steroids  Essential hypertension Continue metoprolol succinate Hydralazine added -not a candidate for ACEi or ARB due to CKD  Peripheral vascular disease ([place]) - He is s/p right fem-pop bypass at [facility] in 05/2018. Remains on Plavix and statin therapy.     Mixed hyperlipidemia - Continue Crestor.  CAD (coronary artery disease) of artery bypass graft - No chest pain -No acute ischemic changes appreciated on EKG -Continue metoprolol, Imdur, Plavix and statin.      {Tip this will not be part of the note when signed Body mass index is 28.63 kg/m. , ,  (Optional):26781}  {(NOTE) Pain control PDMP Statment (Optional):26782} Consultants: cardiology  Procedures performed: none  Disposition: Home Diet recommendation:  Carb modified diet DISCHARGE MEDICATION: Allergies as of 03/12/2022       Reactions   Brilinta [ticagrelor] Shortness Of Breath   Penicillins Hives   Ezetimibe-simvastatin Other (See Comments)   Myalgia     Med Rec must be completed prior to using this [facility]***       Discharge Exam: Filed Weights   03/10/22 0415 03/11/22 0500 03/12/22 0622  Weight: 83.7 kg 84.1 kg 82.9 kg   HEENT:  [place]/AT, No thrush, no icterus CV:  RRR, no rub, no S3, no S4 Lung:  bibasilar crackles. No wheeze Abd:  soft/+BS, NT Ext:  No edema, no lymphangitis, no synovitis, no rash   Condition at discharge: stable  The results of significant diagnostics from this hospitalization (including imaging, microbiology, ancillary  and laboratory) are listed below for reference.   Imaging Studies: DG Chest Port 1 View  Result Date: 03/06/2022 CLINICAL DATA:  Shortness of breath. EXAM: PORTABLE CHEST 1 VIEW COMPARISON:  February 01, 2022. FINDINGS: Stable cardiomegaly. Bilateral pulmonary edema is noted. Bibasilar atelectasis is noted with small right pleural effusion. Bony thorax is unremarkable. IMPRESSION: Bilateral pulmonary edema and bibasilar atelectasis is noted with small right pleural effusion. Electronically Signed   By: Marijo Conception M.D.   On: 03/06/2022 12:32   ECHOCARDIOGRAM LIMITED  Result Date: 03/08/2022    ECHOCARDIOGRAM LIMITED REPORT   Patient Name:   MOUSTAFA MOSSA Date of Exam: 03/08/2022 Medical Rec #:  [id number]     Height:       67.0 in Accession #:    [id number]    Weight:       188.3 lb Date of Birth:  10-23-1940      BSA:          1.971 m Patient Age:    77 years      BP:           134/84 mmHg Patient Gender: M             HR:           87 bpm. Exam Location:  [facility] Procedure: Limited Echo and Intracardiac Opacification Agent Indications:    CHF  History:        Patient has prior history of Echocardiogram examinations, most  recent 01/26/2022. CHF, CAD and Previous Myocardial Infarction,                 COPD, Arrythmias:Atrial Fibrillation, Signs/Symptoms:Chest Pain;                 Risk Factors:Hypertension, Diabetes and Dyslipidemia.  Sonographer:    Wenda Low Referring Phys: [id number] Hazelee Harbold IMPRESSIONS  1. Limited study.  2. Poor acoustic windows.  3. LVEF is severely depressed with hypokinesis worse in the lateral, anterior and apical walls . Left ventricular ejection fraction, by estimation, is 25 to 30%. The left ventricle has severely decreased function. The left ventricular internal cavity size was moderately dilated. There is mild left ventricular hypertrophy.  4. Right ventricular systolic function is normal. The right ventricular size is normal.  5. The inferior vena cava is  dilated in size with <50% respiratory variability, suggesting right atrial pressure of 15 mmHg. FINDINGS  Left Ventricle: LVEF is severely depressed with hypokinesis worse in the lateral, anterior and apical walls. Left ventricular ejection fraction, by estimation, is 25 to 30%. The left ventricle has severely decreased function. Definity contrast agent was  given IV to delineate the left ventricular endocardial borders. The left ventricular internal cavity size was moderately dilated. There is mild left ventricular hypertrophy. Right Ventricle: The right ventricular size is normal. Right vetricular wall thickness was not assessed. Right ventricular systolic function is normal. Pericardium: Trivial pericardial effusion is present. Mitral Valve: There is mild thickening of the mitral valve leaflet(s). Mild mitral annular calcification. Tricuspid Valve: The tricuspid valve is not assessed. Pulmonic Valve: The pulmonic valve was grossly normal. Aorta: The aortic root and ascending aorta are structurally normal, with no evidence of dilitation. Venous: The inferior vena cava is dilated in size with less than 50% respiratory variability, suggesting right atrial pressure of 15 mmHg. LEFT VENTRICLE PLAX 2D LVIDd:         5.50 cm LVIDs:         4.80 cm LV PW:         1.10 cm LV IVS:        1.20 cm LVOT diam:     2.00 cm LVOT Area:     3.14 cm  LEFT ATRIUM         Index LA diam:    4.80 cm 2.44 cm/m   AORTA Ao Root diam: 3.20 cm Ao Asc diam:  3.30 cm  SHUNTS Systemic Diam: 2.00 cm Dorris Carnes MD Electronically signed by Dorris Carnes MD Signature Date/Time: 03/08/2022/4:08:59 PM    Final     Microbiology: Results for orders placed or performed during the hospital encounter of 03/06/22  MRSA Next Gen by PCR, Nasal     Status: None   Collection Time: 03/06/22  7:13 PM   Specimen: Nasal Mucosa; Nasal Swab  Result Value Ref Range Status   MRSA by PCR Next Gen NOT DETECTED NOT DETECTED Final    Comment: (NOTE) The GeneXpert  MRSA Assay (FDA approved for NASAL specimens only), is one component of a comprehensive MRSA colonization surveillance program. It is not intended to diagnose MRSA infection nor to guide or monitor treatment for MRSA infections. Test performance is not FDA approved in patients less than 41 years old. Performed at [facility], [address], [place], [place] [address]   Urine Culture     Status: Abnormal   Collection Time: 03/08/22 11:15 AM   Specimen: Urine, Clean Catch  Result Value Ref Range Status  Specimen Description   Final    URINE, CLEAN CATCH Performed at [facility], [address], [place], [place] [address]    Special Requests   Final    NONE Performed at [facility], [address] [place], [place] [address]    Culture >=100,000 COLONIES/mL ENTEROCOCCUS FAECALIS (A)  Final   Report Status 03/10/2022 FINAL  Final   Organism ID, Bacteria ENTEROCOCCUS FAECALIS (A)  Final      Susceptibility   Enterococcus faecalis - MIC*    AMPICILLIN <=2 SENSITIVE Sensitive     NITROFURANTOIN <=16 SENSITIVE Sensitive     VANCOMYCIN 1 SENSITIVE Sensitive     * >=100,000 COLONIES/mL ENTEROCOCCUS FAECALIS    Labs: CBC: Recent Labs  Lab 03/06/22 1214 03/11/22 0413 03/12/22 0308  WBC 11.4* 10.4 8.4  NEUTROABS 9.9*  --   --   HGB 9.5* 8.9* 8.7*  HCT 31.0* 28.7* 28.2*  MCV 94.2 90.8 90.1  PLT 182 196 098   Basic Metabolic Panel: Recent Labs  Lab 03/06/22 1214 03/06/22 2237 03/07/22 0410 03/08/22 0512 03/09/22 0352 03/10/22 0058 03/11/22 0413 03/12/22 0308  NA 139  --    < > 133* 136 137 138 136  K 4.1  --    < > 4.4 4.3 3.8 5.1 4.4  CL 100  --    < > 95* 96* 97* 98 96*  CO2 31  --    < > 28 30 30 31 31   GLUCOSE 208*  --    < > 345* 229* 172* 213* 155*  BUN 45*  --    < > 62* 70* 80* 83* 85*  CREATININE 2.13*  --    < > 2.48* 2.32* 2.13* 2.22* 2.23*  CALCIUM 8.9  --    < > 8.6* 9.0 8.6* 8.8* 8.8*  MG 1.9 2.1  --  2.1 2.3  --   --  2.5*  PHOS 3.8  --   --   --   --    --   --   --    < > = values in this interval not displayed.   Liver Function Tests: Recent Labs  Lab 03/06/22 1214  AST 12*  ALT 11  ALKPHOS 61  BILITOT 0.9  PROT 6.8  ALBUMIN 3.5   CBG: Recent Labs  Lab 03/11/22 1623 03/11/22 2157 03/12/22 0736 03/12/22 1139 03/12/22 1612  GLUCAP 169* 258* 147* 195* 260*    Discharge time spent: greater than 30 minutes.  Signed: Orson Eva, MD [facility] 03/12/2022

## 2022-03-12 NOTE — Progress Notes (Signed)
Inpatient Diabetes Program Recommendations  AACE/ADA: New Consensus Statement on Inpatient Glycemic Control   Target Ranges:  Prepandial:   less than 140 mg/dL      Peak postprandial:   less than 180 mg/dL (1-2 hours)      Critically ill patients:  140 - 180 mg/dL    Latest Reference Range & Units 03/11/22 08:11 03/11/22 11:02 03/11/22 16:23 03/11/22 21:57 03/12/22 07:36  Glucose-Capillary 70 - 99 mg/dL 278 (H) 304 (H) 169 (H) 258 (H) 147 (H)   Review of Glycemic Control  Diabetes history: DM2 Outpatient Diabetes medications: Lantus 35 units daily, Novolog 7 units TID with meals Current orders for Inpatient glycemic control: Levemir 15 units BID, Novolog 10 units TID with meals, Novolog 0-15 units TID with meals; Prednisone 50 mg QAM  Inpatient Diabetes Program Recommendations:    Insulin: If steroids are continued as ordered, please consider changing Levemir to 20 units QAM, Levemir 15 units QHS and increasing meal coverage to Novolog 12 units TID with meals.  Thanks, Barnie Alderman, RN, MSN, CDE Diabetes Coordinator Inpatient Diabetes Program [phone] (Team Pager from 8am to 5pm)

## 2022-03-12 NOTE — Progress Notes (Signed)
Pt in ventricular bigeminy. Pulse ox shows rate of  45. This RN palpated radial pulse for one full minute and obtained a rate of 70. Vital signs stable. Pt denies chest pain, worsening shortness of breath,or feeling as if his heart is beating any different. He does c/o anxiety. Xanax given. Dr Josephine Cables was informed and recommended to monitor patient. No further orders were provided. Bryson Corona Edd Fabian

## 2022-03-13 DIAGNOSIS — I5020 Unspecified systolic (congestive) heart failure: Secondary | ICD-10-CM | POA: Diagnosis not present

## 2022-03-13 DIAGNOSIS — D631 Anemia in chronic kidney disease: Secondary | ICD-10-CM

## 2022-03-13 DIAGNOSIS — J9621 Acute and chronic respiratory failure with hypoxia: Secondary | ICD-10-CM | POA: Diagnosis not present

## 2022-03-13 DIAGNOSIS — J449 Chronic obstructive pulmonary disease, unspecified: Secondary | ICD-10-CM | POA: Diagnosis not present

## 2022-03-13 DIAGNOSIS — I5043 Acute on chronic combined systolic (congestive) and diastolic (congestive) heart failure: Secondary | ICD-10-CM | POA: Diagnosis not present

## 2022-03-13 DIAGNOSIS — R338 Other retention of urine: Secondary | ICD-10-CM | POA: Diagnosis not present

## 2022-03-13 LAB — VITAMIN B12: Vitamin B-12: 454 pg/mL (ref 180–914)

## 2022-03-13 LAB — BASIC METABOLIC PANEL
Anion gap: 8 (ref 5–15)
BUN: 86 mg/dL — ABNORMAL HIGH (ref 8–23)
CO2: 34 mmol/L — ABNORMAL HIGH (ref 22–32)
Calcium: 8.7 mg/dL — ABNORMAL LOW (ref 8.9–10.3)
Chloride: 94 mmol/L — ABNORMAL LOW (ref 98–111)
Creatinine, Ser: 2.15 mg/dL — ABNORMAL HIGH (ref 0.61–1.24)
GFR, Estimated: 30 mL/min — ABNORMAL LOW (ref >60)
Glucose, Bld: 127 mg/dL — ABNORMAL HIGH (ref 70–99)
Potassium: 4.3 mmol/L (ref 3.5–5.1)
Sodium: 136 mmol/L (ref 135–145)

## 2022-03-13 LAB — IRON AND TIBC
Iron: 30 ug/dL — ABNORMAL LOW (ref 45–182)
Saturation Ratios: 7 % — ABNORMAL LOW (ref 17.9–39.5)
TIBC: 437 ug/dL (ref 250–450)
UIBC: 407 ug/dL

## 2022-03-13 LAB — FERRITIN: Ferritin: 81 ng/mL (ref 24–336)

## 2022-03-13 LAB — FOLATE: Folate: 20.6 ng/mL (ref >5.9)

## 2022-03-13 LAB — GLUCOSE, CAPILLARY: Glucose-Capillary: 124 mg/dL — ABNORMAL HIGH (ref 70–99)

## 2022-03-13 MED ORDER — FERROUS SULFATE 325 (65 FE) MG PO TABS: 325.0000 mg | ORAL_TABLET | Freq: Two times a day (BID) | ORAL | Status: DC

## 2022-03-13 MED ORDER — FERROUS SULFATE 325 (65 FE) MG PO TABS: 325.0000 mg | ORAL_TABLET | Freq: Two times a day (BID) | ORAL | 3 refills | Status: DC

## 2022-03-13 MED ORDER — HYDRALAZINE HCL 25 MG PO TABS: 25.0000 mg | ORAL_TABLET | Freq: Two times a day (BID) | ORAL | 1 refills | Status: DC

## 2022-03-13 NOTE — Progress Notes (Signed)
PT Cancellation Note  Patient Details Name: John [name] MRN: [id number] DOB: 02/02/1941   Cancelled Treatment:    Reason Eval/Treat Not Completed: Other (comment) (Patient getting dressing stating d/c this afternoon); Patient sitting EOB stating d/c this afternoon and he was ready to go.   2:28 PM, 03/13/22 Mearl Latin PT, DPT Physical Therapist at [facility]

## 2022-03-13 NOTE — Progress Notes (Signed)
Nsg Discharge Note  Admit Date:  03/06/2022 Discharge date: 03/13/2022   John [name] to be D/C'd Home per MD order.  AVS completed.  Copy for chart, and copy for patient signed, and dated. Patient/caregiver able to verbalize understanding.  Discharge Medication: Allergies as of 03/13/2022       Reactions   Brilinta [ticagrelor] Shortness Of Breath   Penicillins Hives   Ezetimibe-simvastatin Other (See Comments)   Myalgia        Medication List     TAKE these medications    albuterol 108 (90 Base) MCG/ACT inhaler Commonly known as: VENTOLIN HFA Inhale 2 puffs into the lungs every 6 (six) hours as needed for wheezing or shortness of breath.   allopurinol 300 MG tablet Commonly known as: ZYLOPRIM Take 300 mg by mouth daily.   ALPRAZolam 0.25 MG tablet Commonly known as: XANAX Take 1 tablet (0.25 mg total) by mouth 2 (two) times daily as needed for anxiety.   apixaban 2.5 MG Tabs tablet Commonly known as: ELIQUIS Take 1 tablet (2.5 mg total) by mouth 2 (two) times daily.   BD Pen Needle Nano U/F 32G X 4 MM Misc Generic drug: Insulin Pen Needle Use to inject insulin up to 4 times daily.   CertaVite/Antioxidants Tabs Take 1 tablet by mouth daily.   cholecalciferol 25 MCG (1000 UNIT) tablet Commonly known as: VITAMIN D3 Take 1,000 Units by mouth daily.   clopidogrel 75 MG tablet Commonly known as: PLAVIX Take 1 tablet (75 mg total) by mouth daily.   ferrous sulfate 325 (65 FE) MG tablet Take 1 tablet (325 mg total) by mouth 2 (two) times daily with a meal.   folic acid 1 MG tablet Commonly known as: FOLVITE Take 1 tablet (1 mg total) by mouth daily.   gabapentin 300 MG capsule Commonly known as: NEURONTIN Take 1 capsule (300 mg total) by mouth at bedtime.   hydrALAZINE 25 MG tablet Commonly known as: APRESOLINE Take 1 tablet (25 mg total) by mouth 2 (two) times daily.   HYDROcodone-acetaminophen 5-325 MG tablet Commonly known as: NORCO/VICODIN Take 1  tablet by mouth every 8 (eight) hours as needed for moderate pain.   insulin aspart 100 UNIT/ML injection Commonly known as: novoLOG Inject 7 Units into the skin 3 (three) times daily with meals.   ipratropium-albuterol 0.5-2.5 (3) MG/3ML Soln Commonly known as: DUONEB Take 3 mLs by nebulization 3 (three) times daily.   isosorbide dinitrate 20 MG tablet Commonly known as: ISORDIL Take 1 tablet (20 mg total) by mouth 2 (two) times daily.   Lantus SoloStar 100 UNIT/ML Solostar Pen Generic drug: insulin glargine Inject 35 Units into the skin daily. Dose per sliding scale.   metoprolol succinate 50 MG 24 hr tablet Commonly known as: TOPROL-XL Take 1 tablet (50 mg total) by mouth daily. Take with or immediately following a meal.   mometasone-formoterol 200-5 MCG/ACT Aero Commonly known as: DULERA Inhale 2 puffs into the lungs in the morning and at bedtime.   nitroGLYCERIN 0.4 MG SL tablet Commonly known as: NITROSTAT Place one tablet underneath tongue with chest pain, can repeat in 5 minutes if chest pain persists and also call 911 at that time Sublingual   pantoprazole 40 MG tablet Commonly known as: PROTONIX Take 40 mg by mouth daily with breakfast.   rosuvastatin 10 MG tablet Commonly known as: CRESTOR Take 1 tablet (10 mg total) by mouth every evening.   sertraline 25 MG tablet Commonly known as: ZOLOFT Take 1 tablet (  25 mg total) by mouth every evening.   tamsulosin 0.4 MG Caps capsule Commonly known as: FLOMAX Take 0.4 mg by mouth daily.   thiamine 100 MG tablet Take 1 tablet (100 mg total) by mouth daily.   torsemide 20 MG tablet Commonly known as: DEMADEX Take 2 tablets (40 mg total) by mouth daily. What changed: when to take this   VITAMIN B 12 PO Take 1 tablet by mouth daily.               Durable Medical Equipment  (From admission, onward)           Start     Ordered   03/13/22 1315  For home use only DME oxygen  Once       Comments:  Please evaluate for O2 conserving device for portability  Question Answer Comment  Length of Need Lifetime   Mode or (Route) Nasal cannula   Liters per Minute 4   Oxygen delivery system Gas      03/13/22 1315            Discharge Assessment: Vitals:   03/13/22 0800 03/13/22 1337  BP: 131/61   Pulse: 77   Resp:    Temp:    SpO2: 93% 94%   Skin clean, dry and intact without evidence of skin break down, no evidence of skin tears noted. IV catheter discontinued intact. Site without signs and symptoms of complications - no redness or edema noted at insertion site, patient denies c/o pain - only slight tenderness at site.  Dressing with slight pressure applied.  D/c Instructions-Education: Discharge instructions given to patient/family with verbalized understanding. D/c education completed with patient/family including follow up instructions, medication list, d/c activities limitations if indicated, with other d/c instructions as indicated by MD - patient able to verbalize understanding, all questions fully answered. Patient instructed to return to ED, call 911, or call MD for any changes in condition.  Patient escorted via [place], and D/C home via private auto.  Clovis Fredrickson, LPN 02/27/3266 1:24 PM

## 2022-03-13 NOTE — Progress Notes (Addendum)
Progress Note  Patient Name: John [name] Date of Encounter: 03/13/2022  [facility] Cardiologist: Kirk Ruths, MD    Subjective   Feels back to normal today  Inpatient Medications    Scheduled Meds:  allopurinol  300 mg Oral Daily   apixaban  2.5 mg Oral BID   arformoterol  15 mcg Nebulization BID   budesonide (PULMICORT) nebulizer solution  0.5 mg Nebulization Q12H   chlorhexidine  15 mL Mouth Rinse BID   clopidogrel  75 mg Oral Daily   furosemide  80 mg Intravenous BID   gabapentin  300 mg Oral QHS   hydrALAZINE  25 mg Oral BID   insulin aspart  0-15 Units Subcutaneous TID WC   insulin aspart  12 Units Subcutaneous TID WC   insulin detemir  15 Units Subcutaneous BID   ipratropium-albuterol  3 mL Nebulization TID   isosorbide dinitrate  20 mg Oral BID   mouth rinse  15 mL Mouth Rinse q12n4p   metoprolol succinate  50 mg Oral Daily   multivitamin with minerals  1 tablet Oral Daily   pantoprazole  40 mg Oral Q breakfast   predniSONE  40 mg Oral Q breakfast   rosuvastatin  10 mg Oral QPM   sertraline  25 mg Oral QPM   sodium chloride flush  3 mL Intravenous Q12H   tamsulosin  0.4 mg Oral Daily   Continuous Infusions:  sodium chloride     PRN Meds: sodium chloride, acetaminophen **OR** acetaminophen, albuterol, ALPRAZolam, HYDROcodone-acetaminophen, ondansetron **OR** ondansetron (ZOFRAN) IV, sodium chloride flush   Vital Signs    Vitals:   03/12/22 2114 03/13/22 0428 03/13/22 0757 03/13/22 0800  BP: 135/71 (!) 128/91  131/61  Pulse: 79 (!) 45  77  Resp: 18 18    Temp:  98.4 F (36.9 C)    TempSrc:      SpO2: 96% 92% 93% 93%  Weight:      Height:        Intake/Output Summary (Last 24 hours) at 03/13/2022 1053 Last data filed at 03/13/2022 0438 Gross per 24 hour  Intake 480 ml  Output 2450 ml  Net -1970 ml      03/12/2022    6:22 AM 03/11/2022    5:00 AM 03/10/2022    4:15 AM  Last 3 Weights  Weight (lbs) 182 lb 12.8 oz 185 lb 6.4 oz 184 lb  8.4 oz  Weight (kg) 82.918 kg 84.097 kg 83.7 kg      Telemetry    NSR with PVC's - Personally Reviewed  ECG       Physical Exam    GEN: No acute distress.   Neck: No JVD Cardiac: RRR, no murmurs, rubs, or gallops.  Respiratory: decreased breath sounds with few crackles. GI: Soft, nontender, non-distended  MS: No edema; No deformity. Neuro:  Nonfocal  Psych: Normal affect   Labs    High Sensitivity Troponin:  No results for input(s): TROPONINIHS in the last 720 hours.   Chemistry Recent Labs  Lab 03/06/22 1214 03/06/22 2237 03/08/22 0512 03/09/22 0352 03/10/22 0058 03/11/22 0413 03/12/22 0308 03/13/22 0519  NA 139   < > 133* 136   < > 138 136 136  K 4.1   < > 4.4 4.3   < > 5.1 4.4 4.3  CL 100   < > 95* 96*   < > 98 96* 94*  CO2 31   < > 28 30   < > 31 31  34*  GLUCOSE 208*   < > 345* 229*   < > 213* 155* 127*  BUN 45*   < > 62* 70*   < > 83* 85* 86*  CREATININE 2.13*   < > 2.48* 2.32*   < > 2.22* 2.23* 2.15*  CALCIUM 8.9   < > 8.6* 9.0   < > 8.8* 8.8* 8.7*  MG 1.9   < > 2.1 2.3  --   --  2.5*  --   PROT 6.8  --   --   --   --   --   --   --   ALBUMIN 3.5  --   --   --   --   --   --   --   AST 12*  --   --   --   --   --   --   --   ALT 11  --   --   --   --   --   --   --   ALKPHOS 61  --   --   --   --   --   --   --   BILITOT 0.9  --   --   --   --   --   --   --   GFRNONAA 30*   < > 25* 27*   < > 29* 29* 30*  ANIONGAP 8   < > 10 10   < > 9 9 8    < > = values in this interval not displayed.    Lipids No results for input(s): CHOL, TRIG, HDL, LABVLDL, LDLCALC, CHOLHDL in the last 168 hours.  Hematology Recent Labs  Lab 03/06/22 1214 03/11/22 0413 03/12/22 0308  WBC 11.4* 10.4 8.4  RBC 3.29* 3.16* 3.13*  HGB 9.5* 8.9* 8.7*  HCT 31.0* 28.7* 28.2*  MCV 94.2 90.8 90.1  MCH 28.9 28.2 27.8  MCHC 30.6 31.0 30.9  RDW 17.1* 16.3* 16.2*  PLT 182 196 200   Thyroid No results for input(s): TSH, FREET4 in the last 168 hours.  BNP Recent Labs  Lab  03/06/22 1214 03/10/22 0058  BNP 1,020.0* 817.0*    DDimer No results for input(s): DDIMER in the last 168 hours.   Radiology    No results found.  Cardiac Studies    Limited echo 02/2022 IMPRESSIONS     1. Limited study.   2. Poor acoustic windows.   3. LVEF is severely depressed with hypokinesis worse in the lateral,  anterior and apical walls . Left ventricular ejection fraction, by  estimation, is 25 to 30%. The left ventricle has severely decreased  function. The left ventricular internal cavity  size was moderately dilated. There is mild left ventricular hypertrophy.   4. Right ventricular systolic function is normal. The right ventricular  size is normal.   5. The inferior vena cava is dilated in size with <50% respiratory  variability, suggesting right atrial pressure of 15 mmHg.    Limited Echo: 01/26/2022 IMPRESSIONS     1. Left ventricular ejection fraction, by estimation, is 30 to 35%. The  left ventricle has moderately decreased function. The left ventricle  demonstrates global hypokinesis. The left ventricular internal cavity size  was mildly to moderately dilated.  There is mild left ventricular hypertrophy.   2. Right ventricular systolic function is normal. The right ventricular  size is normal.   3. The inferior vena cava is dilated in size with >50%  respiratory  variability, suggesting right atrial pressure of 8 mmHg.   Comparison(s): The left ventricular function is worsened.       Patient Profile     81 y.o. male  with a hx of  CAD (s/p multiple stents with most recent cath in 2013 showing EF of 40% with DESx2 to LAD, NSTEMI in 12/2021 with EF of 35% and medical management recommended given his AKI), HFrEF (EF 40-45% in 08/2021, at 35% in 12/2021, 30-35% in 01/2022), paroxysmal atrial fibrillation, Type 2 DM, HTN, HLD, PAD (s/p right fem-pop bypass at [facility] in 05/2018), Stage 3-4 CKD, COPD, OSA and prior CVA who is currently admitted for  acute hypoxic respiratory failure. Cardiology consulted for CHF.   Assessment & Plan       1. Acute on Chronic Hypoxic Respiratory Failure - Multifactorial in the setting of acute COPD and CHF exacerbations. Management of CHF as outlined below and COPD is being managed by the admitting team.    2. Acute on chronic HFrEF EF 25-30% on limited echo 03/08/22 - on lasix 80 IV BID, negative 9.9 L since admission, 2.3 past 24 hr. Still mildly hypervolemic on exam, still on IV lasix 80 mg bid is on demadex 40 mg bid PTA,but patient feels back to baseline. Probably ok to discharge with close OP f/u - BNP 817 03/10/22 -Crt 2.15 today down 2.23 yest - Continue Toprol-XL, Hydralazine and Isordil for his cardiomyopathy. No ACE-I/ARB/ARNI/SGLT2 inhibitor given his renal function.    3. CAD - NSTEMI in 12/2021 with EF of 30-35% and medical management recommended given his AKI. Limited echo 03/08/22 EF 25-30% -- Continue medical therapy with Plavix, Crestor, Isordil and Toprol-XL.    4. Paroxysmal Atrial Fibrillation  - presently in SR with PVC's  -  Continue Toprol-XL 50mg  daily for rate-control and Eliquis 2.5mg  BID for anticoagulation (age and Creatinine)   5. HLD - Continue Crestor 10mg  daily. LDL was at 29 in 12/2021.   6. Stage 3-4 CKD - His creatinine was at 2.13 on admission which is close to his baseline. 2.23 today    7. Anemia-Hgb down 8.7-per primary team-was 8.9 03/11/22    8. Frequent PVCs Maintain K>4, Mg>2   [facility] will sign off.   Medication Recommendations:  as above except demadex 40 mg bid Other recommendations (labs, testing, etc):    Follow up as an outpatient:  Dr. [name] 05/15/22 2:40 and will try to get earlier f/u with APP  For questions or updates, please contact [facility] Please consult [email or web address] for contact info under        Signed, Ermalinda Barrios, PA-C  03/13/2022, 10:53 AM      Attending note:  Chart reviewed and case discussed with Ms.  Bonnell Public PA-C.  Mr. Hattabaugh is feeling better in terms of shortness of breath, has diuresed well on IV Lasix with nearly 10 L net output.  Plan is for discharge home today with close outpatient follow-up arranged.  Would go with Demadex 40 mg twice daily as outpatient diuretic, otherwise continue baseline Toprol-XL, hydralazine, Isordil, Plavix, and Crestor.  He is also on Eliquis at 2.5 mg twice daily.  Satira Sark, M.D., F.A.C.C.

## 2022-03-13 NOTE — TOC Transition Note (Signed)
Transition of Care [facility]) - CM/SW Discharge Note   Patient Details  Name: John [name] MRN: [id number] Date of Birth: 1940/11/12  Transition of Care [facility]) CM/SW Contact:  Shade Flood, LCSW Phone Number: 03/13/2022, 1:31 PM   Clinical Narrative:     Pt stable for dc home with [facility] PT/OT today per MD. Met with pt to review dc planning. Pt agreeable to [facility]. CMS provider options reviewed. Referred to [facility] at pt request. Pt asking about POC for his O2. MD entered order for [employer] to eval pt for eligibility. Caryl Pina at [employer] updated and [employer] will follow up with pt at home to review.  There are no other TOC needs identified for dc.  Final next level of care: [place] Barriers to Discharge: Barriers Resolved   Patient Goals and CMS Choice Patient states their goals for this hospitalization and ongoing recovery are:: reutrn home with [facility] CMS Medicare.gov Compare Post Acute Care list provided to:: Patient Choice offered to / list presented to : Patient, Adult Children  Discharge Placement                       Discharge Plan and Services In-house Referral: Clinical Social Work   Post Acute Care Choice: Home Health                    HH Arranged: PT, OT [facility] Agency: [place] Date [place]: 03/13/22   Representative spoke with at [place]: [place] ([place]) Interventions     Readmission Risk Interventions    03/08/2022   11:55 AM 01/29/2022   10:39 AM 12/29/2021   11:45 AM  Readmission Risk Prevention Plan  Transportation Screening Complete Complete Complete  PCP or Specialist Appt within 3-5 Days   Complete  HRI or [place]   Complete  Social Work Consult for [place] Planning/Counseling   Complete  Palliative Care Screening   Not Applicable  Medication Review Press photographer) Complete Complete Complete  HRI or [place] Complete Complete   SW Recovery Care/Counseling  Consult Complete Complete   Palliative Care Screening Not Applicable Not [place] Not Applicable Not Applicable

## 2022-03-13 NOTE — Assessment & Plan Note (Addendum)
Hgb overall stable during hospitalization Hgb 9.3>>8.7 B12--454 Folate--20.6 Iron sat--7% Ferritin--81 Start ferrous sulfate

## 2022-03-14 ENCOUNTER — Other Ambulatory Visit: Payer: Self-pay

## 2022-03-14 DIAGNOSIS — I5031 Acute diastolic (congestive) heart failure: Secondary | ICD-10-CM

## 2022-03-15 NOTE — Patient Outreach (Signed)
[place] [facility]) Care Management  03/15/2022  Reino Lybbert 1940-11-11 [id number]   Received hospital referral from Netta Cedars, RN for RN case manager for care coordination services. Assigned patient to Deloria Lair, RN care coordinator for follow up.   [place] Management Assistant [phone]

## 2022-03-16 ENCOUNTER — Encounter: Payer: Self-pay | Admitting: *Deleted

## 2022-03-16 ENCOUNTER — Other Ambulatory Visit: Payer: Self-pay | Admitting: *Deleted

## 2022-03-16 NOTE — Patient Outreach (Signed)
[facility]) Care Management  03/16/2022  John [name] Jul 19, 1941 [id number]  Initial Transition of care call made. John [name] discharged from [facility] on 03/13/22 after several day stay for HF. He has had several admits for this problem recently. He gave permission for [place] [facility].  Transition of care call completed. Found that pt does not have [place] for meal times                                                                                                    NEBULIZER                                                                                                    DUONEBS.  [facility], Dr. Francesco Sor, pt's primary care and left a message on Rebecca's voicemail. Pt does have an appt with him on Tuesday, May 30th.  John [name] reports he is following HF Action Plan although he didn't know that was what is is called. His wt is 182#, He is only SOB with moderate exertion, he denies edema. He is avoiding na, takes his meds as ordered. He will see Dr. Francesco Sor on Tuesday next week.  Patient was recently discharged from hospital and all medications have been reviewed. NOTE PT DOES NOT HAVE NOLOVOG, NEBULIZER OR DUONEBS TXS!!! Outpatient Encounter Medications as of 03/16/2022  Medication Sig   albuterol (VENTOLIN HFA) 108 (90 Base) MCG/ACT inhaler Inhale 2 puffs into the lungs every 6 (six) hours as needed for wheezing or shortness of breath.   allopurinol (ZYLOPRIM) 300 MG tablet Take 300 mg by mouth daily.   ALPRAZolam (XANAX) 0.25 MG tablet Take 1 tablet (0.25 mg total) by mouth 2 (two) times daily as needed for anxiety.   apixaban (ELIQUIS) 2.5 MG TABS tablet Take 1 tablet (2.5 mg total) by mouth 2 (two) times daily.   cholecalciferol (VITAMIN D3) 25 MCG (1000 UNIT) tablet Take 1,000 Units by mouth daily.   clopidogrel (PLAVIX) 75 MG tablet Take 1 tablet (75 mg total) by mouth daily.   Cyanocobalamin (VITAMIN B 12 PO) Take 1 tablet by mouth  daily.   ferrous sulfate 325 (65 FE) MG tablet Take 1 tablet (325 mg total) by mouth 2 (two) times daily with a meal.   folic acid (FOLVITE) 1 MG tablet Take 1 tablet (1 mg total) by mouth daily.   gabapentin (NEURONTIN) 300 MG capsule Take 1 capsule (300 mg total) by mouth at bedtime.   hydrALAZINE (APRESOLINE) 25 MG tablet Take 1 tablet (25 mg total) by mouth 2 (two) times daily.   insulin glargine (LANTUS SOLOSTAR) 100 UNIT/ML Solostar Pen Inject 35 Units into the  skin daily. Dose per sliding scale.   Insulin Pen Needle 32G X 4 MM MISC Use to inject insulin up to 4 times daily.   isosorbide dinitrate (ISORDIL) 20 MG tablet Take 1 tablet (20 mg total) by mouth 2 (two) times daily.   metoprolol succinate (TOPROL-XL) 50 MG 24 hr tablet Take 1 tablet (50 mg total) by mouth daily. Take with or immediately following a meal.   mometasone-formoterol (DULERA) 200-5 MCG/ACT AERO Inhale 2 puffs into the lungs in the morning and at bedtime.   Multiple Vitamins-Minerals (CERTAVITE/ANTIOXIDANTS) TABS Take 1 tablet by mouth daily.   pantoprazole (PROTONIX) 40 MG tablet Take 40 mg by mouth daily with breakfast.   rosuvastatin (CRESTOR) 10 MG tablet Take 1 tablet (10 mg total) by mouth every evening.   sertraline (ZOLOFT) 25 MG tablet Take 1 tablet (25 mg total) by mouth every evening.   tamsulosin (FLOMAX) 0.4 MG CAPS capsule Take 0.4 mg by mouth daily.   thiamine 100 MG tablet Take 1 tablet (100 mg total) by mouth daily.   torsemide (DEMADEX) 20 MG tablet Take 2 tablets (40 mg total) by mouth daily. (Patient taking differently: Take 40 mg by mouth 2 (two) times daily.)   HYDROcodone-acetaminophen (NORCO/VICODIN) 5-325 MG tablet Take 1 tablet by mouth every 8 (eight) hours as needed for moderate pain. (Patient not taking: Reported on 03/16/2022)   insulin aspart (NOVOLOG) 100 UNIT/ML injection Inject 7 Units into the skin 3 (three) times daily with meals.   ipratropium-albuterol (DUONEB) 0.5-2.5 (3) MG/3ML SOLN  Take 3 mLs by nebulization 3 (three) times daily. (Patient not taking: Reported on 03/16/2022)   nitroGLYCERIN (NITROSTAT) 0.4 MG SL tablet Place one tablet underneath tongue with chest pain, can repeat in 5 minutes if chest pain persists and also call 911 at that time Sublingual (Patient not taking: Reported on 03/16/2022)   No facility-administered encounter medications on file as of 03/16/2022.   We will talk again next week and NP will complete his initial assessment.  Eulah Pont. Myrtie Neither, MSN, [facility] Gerontological Nurse Practitioner [facility] Care Management [phone]

## 2022-03-20 LAB — GLUCOSE, CAPILLARY: Glucose-Capillary: 269 mg/dL — ABNORMAL HIGH (ref 70–99)

## 2022-03-22 ENCOUNTER — Other Ambulatory Visit: Payer: Self-pay | Admitting: *Deleted

## 2022-03-23 ENCOUNTER — Emergency Department (HOSPITAL_COMMUNITY): Payer: Medicare Other

## 2022-03-23 ENCOUNTER — Encounter (HOSPITAL_COMMUNITY): Payer: Self-pay

## 2022-03-23 ENCOUNTER — Inpatient Hospital Stay (HOSPITAL_COMMUNITY)
Admission: EM | Admit: 2022-03-23 | Discharge: 2022-03-31 | DRG: 286 | Disposition: A | Discharge status: Home Health | Payer: Medicare Other | Attending: Internal Medicine | Admitting: Internal Medicine

## 2022-03-23 ENCOUNTER — Other Ambulatory Visit: Payer: Self-pay

## 2022-03-23 DIAGNOSIS — N401 Enlarged prostate with lower urinary tract symptoms: Secondary | ICD-10-CM | POA: Diagnosis not present

## 2022-03-23 DIAGNOSIS — Z7901 Long term (current) use of anticoagulants: Secondary | ICD-10-CM

## 2022-03-23 DIAGNOSIS — Z1152 Encounter for screening for COVID-19: Secondary | ICD-10-CM | POA: Diagnosis not present

## 2022-03-23 DIAGNOSIS — J61 Pneumoconiosis due to asbestos and other mineral fibers: Secondary | ICD-10-CM | POA: Diagnosis present

## 2022-03-23 DIAGNOSIS — I248 Other forms of acute ischemic heart disease: Secondary | ICD-10-CM | POA: Diagnosis present

## 2022-03-23 DIAGNOSIS — I25700 Atherosclerosis of coronary artery bypass graft(s), unspecified, with unstable angina pectoris: Secondary | ICD-10-CM

## 2022-03-23 DIAGNOSIS — Z9981 Dependence on supplemental oxygen: Secondary | ICD-10-CM | POA: Diagnosis not present

## 2022-03-23 DIAGNOSIS — Z95820 Peripheral vascular angioplasty status with implants and grafts: Secondary | ICD-10-CM

## 2022-03-23 DIAGNOSIS — I130 Hypertensive heart and chronic kidney disease with heart failure and stage 1 through stage 4 chronic kidney disease, or unspecified chronic kidney disease: Secondary | ICD-10-CM | POA: Diagnosis present

## 2022-03-23 DIAGNOSIS — Z794 Long term (current) use of insulin: Secondary | ICD-10-CM | POA: Diagnosis not present

## 2022-03-23 DIAGNOSIS — I25708 Atherosclerosis of coronary artery bypass graft(s), unspecified, with other forms of angina pectoris: Secondary | ICD-10-CM | POA: Diagnosis not present

## 2022-03-23 DIAGNOSIS — R778 Other specified abnormalities of plasma proteins: Secondary | ICD-10-CM

## 2022-03-23 DIAGNOSIS — F419 Anxiety disorder, unspecified: Secondary | ICD-10-CM | POA: Diagnosis present

## 2022-03-23 DIAGNOSIS — E782 Mixed hyperlipidemia: Secondary | ICD-10-CM | POA: Diagnosis present

## 2022-03-23 DIAGNOSIS — I25810 Atherosclerosis of coronary artery bypass graft(s) without angina pectoris: Secondary | ICD-10-CM | POA: Diagnosis present

## 2022-03-23 DIAGNOSIS — Z818 Family history of other mental and behavioral disorders: Secondary | ICD-10-CM

## 2022-03-23 DIAGNOSIS — R0602 Shortness of breath: Secondary | ICD-10-CM | POA: Diagnosis present

## 2022-03-23 DIAGNOSIS — Z79899 Other long term (current) drug therapy: Secondary | ICD-10-CM

## 2022-03-23 DIAGNOSIS — I25118 Atherosclerotic heart disease of native coronary artery with other forms of angina pectoris: Secondary | ICD-10-CM

## 2022-03-23 DIAGNOSIS — J9621 Acute and chronic respiratory failure with hypoxia: Secondary | ICD-10-CM

## 2022-03-23 DIAGNOSIS — E1122 Type 2 diabetes mellitus with diabetic chronic kidney disease: Secondary | ICD-10-CM | POA: Diagnosis present

## 2022-03-23 DIAGNOSIS — R338 Other retention of urine: Secondary | ICD-10-CM | POA: Diagnosis not present

## 2022-03-23 DIAGNOSIS — E11649 Type 2 diabetes mellitus with hypoglycemia without coma: Secondary | ICD-10-CM | POA: Diagnosis not present

## 2022-03-23 DIAGNOSIS — E0842 Diabetes mellitus due to underlying condition with diabetic polyneuropathy: Secondary | ICD-10-CM

## 2022-03-23 DIAGNOSIS — I480 Paroxysmal atrial fibrillation: Secondary | ICD-10-CM | POA: Diagnosis present

## 2022-03-23 DIAGNOSIS — Z880 Allergy status to penicillin: Secondary | ICD-10-CM

## 2022-03-23 DIAGNOSIS — I249 Acute ischemic heart disease, unspecified: Principal | ICD-10-CM

## 2022-03-23 DIAGNOSIS — D509 Iron deficiency anemia, unspecified: Secondary | ICD-10-CM | POA: Diagnosis present

## 2022-03-23 DIAGNOSIS — I739 Peripheral vascular disease, unspecified: Secondary | ICD-10-CM | POA: Diagnosis present

## 2022-03-23 DIAGNOSIS — I10 Essential (primary) hypertension: Secondary | ICD-10-CM | POA: Diagnosis present

## 2022-03-23 DIAGNOSIS — N189 Chronic kidney disease, unspecified: Secondary | ICD-10-CM | POA: Diagnosis present

## 2022-03-23 DIAGNOSIS — E1151 Type 2 diabetes mellitus with diabetic peripheral angiopathy without gangrene: Secondary | ICD-10-CM | POA: Diagnosis present

## 2022-03-23 DIAGNOSIS — E876 Hypokalemia: Secondary | ICD-10-CM | POA: Diagnosis present

## 2022-03-23 DIAGNOSIS — F32A Depression, unspecified: Secondary | ICD-10-CM

## 2022-03-23 DIAGNOSIS — Z8673 Personal history of transient ischemic attack (TIA), and cerebral infarction without residual deficits: Secondary | ICD-10-CM | POA: Diagnosis not present

## 2022-03-23 DIAGNOSIS — I252 Old myocardial infarction: Secondary | ICD-10-CM

## 2022-03-23 DIAGNOSIS — I493 Ventricular premature depolarization: Secondary | ICD-10-CM | POA: Diagnosis present

## 2022-03-23 DIAGNOSIS — I2510 Atherosclerotic heart disease of native coronary artery without angina pectoris: Secondary | ICD-10-CM | POA: Diagnosis present

## 2022-03-23 DIAGNOSIS — I255 Ischemic cardiomyopathy: Secondary | ICD-10-CM | POA: Diagnosis present

## 2022-03-23 DIAGNOSIS — D631 Anemia in chronic kidney disease: Secondary | ICD-10-CM | POA: Diagnosis present

## 2022-03-23 DIAGNOSIS — E785 Hyperlipidemia, unspecified: Secondary | ICD-10-CM | POA: Diagnosis not present

## 2022-03-23 DIAGNOSIS — K219 Gastro-esophageal reflux disease without esophagitis: Secondary | ICD-10-CM | POA: Diagnosis present

## 2022-03-23 DIAGNOSIS — J9611 Chronic respiratory failure with hypoxia: Secondary | ICD-10-CM

## 2022-03-23 DIAGNOSIS — Z833 Family history of diabetes mellitus: Secondary | ICD-10-CM

## 2022-03-23 DIAGNOSIS — D696 Thrombocytopenia, unspecified: Secondary | ICD-10-CM | POA: Diagnosis not present

## 2022-03-23 DIAGNOSIS — I5043 Acute on chronic combined systolic (congestive) and diastolic (congestive) heart failure: Secondary | ICD-10-CM | POA: Diagnosis not present

## 2022-03-23 DIAGNOSIS — N179 Acute kidney failure, unspecified: Secondary | ICD-10-CM | POA: Diagnosis not present

## 2022-03-23 DIAGNOSIS — Z87891 Personal history of nicotine dependence: Secondary | ICD-10-CM

## 2022-03-23 DIAGNOSIS — I70201 Unspecified atherosclerosis of native arteries of extremities, right leg: Secondary | ICD-10-CM | POA: Diagnosis present

## 2022-03-23 DIAGNOSIS — N400 Enlarged prostate without lower urinary tract symptoms: Secondary | ICD-10-CM | POA: Diagnosis present

## 2022-03-23 DIAGNOSIS — J449 Chronic obstructive pulmonary disease, unspecified: Secondary | ICD-10-CM | POA: Diagnosis present

## 2022-03-23 DIAGNOSIS — E1165 Type 2 diabetes mellitus with hyperglycemia: Secondary | ICD-10-CM

## 2022-03-23 DIAGNOSIS — G4733 Obstructive sleep apnea (adult) (pediatric): Secondary | ICD-10-CM | POA: Diagnosis present

## 2022-03-23 DIAGNOSIS — Z7902 Long term (current) use of antithrombotics/antiplatelets: Secondary | ICD-10-CM

## 2022-03-23 DIAGNOSIS — Z9621 Cochlear implant status: Secondary | ICD-10-CM | POA: Diagnosis present

## 2022-03-23 DIAGNOSIS — N184 Chronic kidney disease, stage 4 (severe): Secondary | ICD-10-CM | POA: Diagnosis not present

## 2022-03-23 DIAGNOSIS — Z955 Presence of coronary angioplasty implant and graft: Secondary | ICD-10-CM

## 2022-03-23 DIAGNOSIS — E1169 Type 2 diabetes mellitus with other specified complication: Secondary | ICD-10-CM | POA: Diagnosis present

## 2022-03-23 DIAGNOSIS — Z888 Allergy status to other drugs, medicaments and biological substances status: Secondary | ICD-10-CM

## 2022-03-23 LAB — URINALYSIS, ROUTINE W REFLEX MICROSCOPIC
Bilirubin Urine: NEGATIVE
Glucose, UA: NEGATIVE mg/dL
Hgb urine dipstick: NEGATIVE
Ketones, ur: NEGATIVE mg/dL
Leukocytes,Ua: NEGATIVE
Nitrite: NEGATIVE
Protein, ur: NEGATIVE mg/dL
Specific Gravity, Urine: 1.009 (ref 1.005–1.030)
pH: 5 (ref 5.0–8.0)

## 2022-03-23 LAB — COMPREHENSIVE METABOLIC PANEL
ALT: 13 U/L (ref 0–44)
AST: 16 U/L (ref 15–41)
Albumin: 3.1 g/dL — ABNORMAL LOW (ref 3.5–5.0)
Alkaline Phosphatase: 51 U/L (ref 38–126)
Anion gap: 11 (ref 5–15)
BUN: 56 mg/dL — ABNORMAL HIGH (ref 8–23)
CO2: 29 mmol/L (ref 22–32)
Calcium: 8.8 mg/dL — ABNORMAL LOW (ref 8.9–10.3)
Chloride: 99 mmol/L (ref 98–111)
Creatinine, Ser: 2.23 mg/dL — ABNORMAL HIGH (ref 0.61–1.24)
GFR, Estimated: 29 mL/min — ABNORMAL LOW (ref >60)
Glucose, Bld: 176 mg/dL — ABNORMAL HIGH (ref 70–99)
Potassium: 3.5 mmol/L (ref 3.5–5.1)
Sodium: 139 mmol/L (ref 135–145)
Total Bilirubin: 0.9 mg/dL (ref 0.3–1.2)
Total Protein: 5.9 g/dL — ABNORMAL LOW (ref 6.5–8.1)

## 2022-03-23 LAB — GLUCOSE, CAPILLARY
Glucose-Capillary: 130 mg/dL — ABNORMAL HIGH (ref 70–99)
Glucose-Capillary: 127 mg/dL — ABNORMAL HIGH (ref 70–99)

## 2022-03-23 LAB — CBC
HCT: 29.8 % — ABNORMAL LOW (ref 39.0–52.0)
Hemoglobin: 8.8 g/dL — ABNORMAL LOW (ref 13.0–17.0)
MCH: 27.7 pg (ref 26.0–34.0)
MCHC: 29.5 g/dL — ABNORMAL LOW (ref 30.0–36.0)
MCV: 93.7 fL (ref 80.0–100.0)
Platelets: 141 10*3/uL — ABNORMAL LOW (ref 150–400)
RBC: 3.18 MIL/uL — ABNORMAL LOW (ref 4.22–5.81)
RDW: 17.9 % — ABNORMAL HIGH (ref 11.5–15.5)
WBC: 12.1 10*3/uL — ABNORMAL HIGH (ref 4.0–10.5)
nRBC: 0.3 % — ABNORMAL HIGH (ref 0.0–0.2)

## 2022-03-23 LAB — RESP PANEL BY RT-PCR (FLU A&B, COVID) ARPGX2
Influenza A by PCR: NEGATIVE
Influenza B by PCR: NEGATIVE
SARS Coronavirus 2 by RT PCR: NEGATIVE

## 2022-03-23 LAB — TROPONIN I (HIGH SENSITIVITY)
Troponin I (High Sensitivity): 112 ng/L (ref <18)
Troponin I (High Sensitivity): 168 ng/L (ref <18)

## 2022-03-23 LAB — BRAIN NATRIURETIC PEPTIDE: B Natriuretic Peptide: 769.5 pg/mL — ABNORMAL HIGH (ref 0.0–100.0)

## 2022-03-23 MED ORDER — ALPRAZOLAM 0.25 MG PO TABS
0.2500 mg | ORAL_TABLET | Freq: Two times a day (BID) | ORAL | Status: DC | PRN
  Administered 2022-03-23 – 2022-03-30 (×8): 0.25 mg via ORAL
  Filled 2022-03-23 (×8): qty 1

## 2022-03-23 MED ORDER — GABAPENTIN 300 MG PO CAPS
300.0000 mg | ORAL_CAPSULE | Freq: Every day | ORAL | Status: DC
  Administered 2022-03-23 – 2022-03-30 (×8): 300 mg via ORAL
  Filled 2022-03-23 (×8): qty 1

## 2022-03-23 MED ORDER — METOPROLOL SUCCINATE ER 50 MG PO TB24
50.0000 mg | ORAL_TABLET | Freq: Every day | ORAL | Status: DC
  Administered 2022-03-24 – 2022-03-31 (×8): 50 mg via ORAL
  Filled 2022-03-23 (×8): qty 1

## 2022-03-23 MED ORDER — ACETAMINOPHEN 325 MG PO TABS
650.0000 mg | ORAL_TABLET | Freq: Four times a day (QID) | ORAL | Status: DC | PRN
  Administered 2022-03-23 – 2022-03-30 (×10): 650 mg via ORAL
  Filled 2022-03-23 (×10): qty 2

## 2022-03-23 MED ORDER — MOMETASONE FURO-FORMOTEROL FUM 200-5 MCG/ACT IN AERO
2.0000 | INHALATION_SPRAY | Freq: Two times a day (BID) | RESPIRATORY_TRACT | Status: DC
  Administered 2022-03-23 – 2022-03-31 (×16): 2 via RESPIRATORY_TRACT
  Filled 2022-03-23: qty 8.8

## 2022-03-23 MED ORDER — FUROSEMIDE 10 MG/ML IJ SOLN
80.0000 mg | Freq: Two times a day (BID) | INTRAMUSCULAR | Status: DC
  Administered 2022-03-23 – 2022-03-25 (×6): 80 mg via INTRAVENOUS
  Filled 2022-03-23 (×6): qty 8

## 2022-03-23 MED ORDER — NITROGLYCERIN 2 % TD OINT
1.0000 [in_us] | TOPICAL_OINTMENT | Freq: Four times a day (QID) | TRANSDERMAL | Status: DC
  Administered 2022-03-24 – 2022-03-27 (×10): 1 [in_us] via TOPICAL
  Filled 2022-03-23: qty 30
  Filled 2022-03-23: qty 1

## 2022-03-23 MED ORDER — SODIUM CHLORIDE 0.9% FLUSH
3.0000 mL | Freq: Two times a day (BID) | INTRAVENOUS | Status: DC
  Administered 2022-03-23 – 2022-03-27 (×6): 3 mL via INTRAVENOUS

## 2022-03-23 MED ORDER — ALBUTEROL SULFATE (2.5 MG/3ML) 0.083% IN NEBU
2.5000 mg | INHALATION_SOLUTION | RESPIRATORY_TRACT | Status: DC | PRN
  Administered 2022-03-23: 2.5 mg via RESPIRATORY_TRACT
  Filled 2022-03-23: qty 3

## 2022-03-23 MED ORDER — ACETAMINOPHEN 650 MG RE SUPP: 650.0000 mg | Freq: Four times a day (QID) | RECTAL | Status: DC | PRN

## 2022-03-23 MED ORDER — FERROUS SULFATE 325 (65 FE) MG PO TABS
325.0000 mg | ORAL_TABLET | Freq: Two times a day (BID) | ORAL | Status: DC
  Administered 2022-03-23 – 2022-03-31 (×15): 325 mg via ORAL
  Filled 2022-03-23 (×15): qty 1

## 2022-03-23 MED ORDER — PANTOPRAZOLE SODIUM 40 MG PO TBEC
40.0000 mg | DELAYED_RELEASE_TABLET | Freq: Every day | ORAL | Status: DC
  Administered 2022-03-24 – 2022-03-31 (×7): 40 mg via ORAL
  Filled 2022-03-23 (×7): qty 1

## 2022-03-23 MED ORDER — ALLOPURINOL 300 MG PO TABS
300.0000 mg | ORAL_TABLET | Freq: Every day | ORAL | Status: DC
  Administered 2022-03-24 – 2022-03-31 (×7): 300 mg via ORAL
  Filled 2022-03-23 (×7): qty 1

## 2022-03-23 MED ORDER — CLOPIDOGREL BISULFATE 75 MG PO TABS
75.0000 mg | ORAL_TABLET | Freq: Every day | ORAL | Status: DC
  Administered 2022-03-24 – 2022-03-31 (×8): 75 mg via ORAL
  Filled 2022-03-23 (×8): qty 1

## 2022-03-23 MED ORDER — TAMSULOSIN HCL 0.4 MG PO CAPS
0.4000 mg | ORAL_CAPSULE | Freq: Every day | ORAL | Status: DC
  Administered 2022-03-24 – 2022-03-31 (×7): 0.4 mg via ORAL
  Filled 2022-03-23 (×7): qty 1

## 2022-03-23 MED ORDER — THIAMINE HCL 100 MG PO TABS
100.0000 mg | ORAL_TABLET | Freq: Every day | ORAL | Status: DC
  Administered 2022-03-24 – 2022-03-31 (×7): 100 mg via ORAL
  Filled 2022-03-23 (×7): qty 1

## 2022-03-23 MED ORDER — ROSUVASTATIN CALCIUM 5 MG PO TABS
10.0000 mg | ORAL_TABLET | Freq: Every evening | ORAL | Status: DC
  Administered 2022-03-23 – 2022-03-30 (×8): 10 mg via ORAL
  Filled 2022-03-23 (×8): qty 2

## 2022-03-23 MED ORDER — APIXABAN 2.5 MG PO TABS
2.5000 mg | ORAL_TABLET | Freq: Two times a day (BID) | ORAL | Status: DC
  Administered 2022-03-23 – 2022-03-31 (×15): 2.5 mg via ORAL
  Filled 2022-03-23 (×15): qty 1

## 2022-03-23 MED ORDER — HYDRALAZINE HCL 25 MG PO TABS
25.0000 mg | ORAL_TABLET | Freq: Two times a day (BID) | ORAL | Status: DC
  Administered 2022-03-23 – 2022-03-31 (×15): 25 mg via ORAL
  Filled 2022-03-23 (×15): qty 1

## 2022-03-23 MED ORDER — INSULIN ASPART 100 UNIT/ML IJ SOLN
0.0000 [IU] | Freq: Three times a day (TID) | INTRAMUSCULAR | Status: DC
  Administered 2022-03-24: 1 [IU] via SUBCUTANEOUS
  Administered 2022-03-25 (×2): 2 [IU] via SUBCUTANEOUS
  Administered 2022-03-25: 1 [IU] via SUBCUTANEOUS
  Administered 2022-03-26: 3 [IU] via SUBCUTANEOUS
  Administered 2022-03-27: 2 [IU] via SUBCUTANEOUS
  Administered 2022-03-27 – 2022-03-28 (×3): 1 [IU] via SUBCUTANEOUS
  Administered 2022-03-28: 3 [IU] via SUBCUTANEOUS
  Administered 2022-03-29: 5 [IU] via SUBCUTANEOUS
  Administered 2022-03-29: 1 [IU] via SUBCUTANEOUS

## 2022-03-23 MED ORDER — SERTRALINE HCL 50 MG PO TABS
25.0000 mg | ORAL_TABLET | Freq: Every evening | ORAL | Status: DC
  Administered 2022-03-23 – 2022-03-30 (×8): 25 mg via ORAL
  Filled 2022-03-23 (×8): qty 1

## 2022-03-23 MED ORDER — ISOSORBIDE DINITRATE 20 MG PO TABS
20.0000 mg | ORAL_TABLET | Freq: Two times a day (BID) | ORAL | Status: DC
Start: 2022-03-23 — End: 2022-03-23

## 2022-03-23 MED ORDER — INSULIN DETEMIR 100 UNIT/ML ~~LOC~~ SOLN
15.0000 [IU] | Freq: Two times a day (BID) | SUBCUTANEOUS | Status: DC
  Administered 2022-03-23 – 2022-03-24 (×2): 15 [IU] via SUBCUTANEOUS
  Filled 2022-03-23 (×4): qty 0.15

## 2022-03-23 MED ORDER — INSULIN ASPART 100 UNIT/ML IJ SOLN
7.0000 [IU] | Freq: Three times a day (TID) | INTRAMUSCULAR | Status: DC
  Administered 2022-03-23 – 2022-03-24 (×2): 7 [IU] via SUBCUTANEOUS

## 2022-03-23 NOTE — ED Triage Notes (Signed)
Patient complains of shortness of breath and sats being in the 70's through the night.  Patient is 83% on RA placed on 4L [place] in triage.  Complains of tightness in his chest but not "pain".

## 2022-03-23 NOTE — Consult Note (Addendum)
Cardiology Consultation:   Patient ID: John [name] MRN: [id number]; DOB: 1941-04-10  Admit date: 03/23/2022 Date of Consult: 03/23/2022  PCP:  [name], [facility] Cardiologist:  Kirk Ruths, MD     Patient Profile:   John [name] is a 81 y.o. 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 Tamala Julian.  History of Present Illness:   Mr. John [name] is an 81 year old male with above medical history who is followed by Dr. [name]. Per chart review, patient has a history of CAD with prior MI 40 years ago. Cardiac catheterization in June 2013 in [place] with PCI to the LADx2. There was residual 30-40% mid right coronary artery and a 30-40% mid PDA. The left main was normal. The circumflex had a 90% lesion after the origin of the first marginal. EF was 40%.   Patient had an NSTEMI in 12/2021, however medical management was pursued due to AKI. Echocardiogram on 12/25/21 showed EF 35%, moderately decreased LV function, mild LVH, grade II diastolic dysfunction, normal RV systolic function. Patient was encouraged to continue carvedilol, hydralazine, isordil. Was not discharged on diuretic given AKI and possible ATN. Discharged on 3/10.  Patient was again seen by cardiology on 01/09/22 for evaluation of increasing SOB and weight gain. Was diuresed with IV lasix, discharged on oral demadex on 3/23. At his follow up appointment on 4/4, patient was euvolemic.  He did complain of some dyspnea on exertion, discussed cardiac catheterization but decided to continue medical management of CAD.   3 days after his follow up appointment, patient presented to [facility] for evaluation of worsening dyspnea. Oxygen saturation was 65% on room air at presentation.  Underwent limited echocardiogram on 01/26/22 that showed EF 30-35%, mild LVH. Patient underwent thoracentesis and was diuresed. Discharged 4/17.   Patient was most recently seen by cardiology on 03/07/22. Patient presented to [facility] for evaluation of worsening dyspnea. Patient reported that his PCP had increased his torsemide to 80 mg daily, but he had not noticed increased urination and he had experienced worsening orthopnea. Patient underwent treatment for COPD exacerbation and CHF. Limited echocardiogram on 03/08/22 showed EF 25-30%, moderately dilated LV, normal RV systolic function. Patient was diuresed and discharged on demadex 40 mg BID, toprol-xl, hydralazine, isordil.   Patient presented to [facility] ED on 6/2 complaining of shortness of breath, sats being in the 70s throughout the night, and chest tightness. Labs in the ED showed Na 139, K 3.5, creatinine 2.23, calcium 8.8, WBC 12.1, hemoglobin 8.8, platelets 141. hsTn elevated to 112>>168. BNP elevated to 769.5.  CXR showed cardiomegaly with pulmonary edema, persistent small to moderate right pleural effusion.  EKG showed sinus rhythm, PVCs, nonspecific T wave abnormality.   On interview, patient reports that he has had some dyspnea on exertion since he was discharged from the hospital in May.  Last night, patient began to develop shortness of breath at rest.  Worse when laying flat.  Patient was unable to sleep due to shortness of breath.  Denied any chest pain, however did report some chest tightness when he tried to take a deep breath. Denies any symptoms similar to what he experienced prior to past heart attacks. Feels like his abdomen is  more swollen/bloated than usual.  Patient also has COPD and his inhalers have been helping his shortness of breath. He has been compliant with his heart failure medications.  Patient has not felt any palpitations, however believes he has had episodes of A-fib because his pulse ox shows a  variable heart rate at times.  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)    BPH (benign prostatic hyperplasia)    CAD (coronary artery disease)    CHF (congestive heart failure) (HCC)    Cholesteatoma of attic    Depression    Diabetes mellitus ([place])    Ear disease    GERD (gastroesophageal reflux disease)    Hyperlipidemia    Hypertension    PVD (peripheral vascular disease) ([place])    Stroke [facility])     Past Surgical History:  Procedure Laterality Date   BONE ANCHORED HEARING AID IMPLANT Left 06/25/2013   Procedure: BONE ANCHORED HEARING AID (BAHA) IMPLANT;  Surgeon: Fannie Knee, MD;  Location: [place];  Service: ENT;  Laterality: Left;   CAROTID STENT INSERTION Left    COCHLEAR IMPLANT     CORONARY STENT PLACEMENT     MASS EXCISION Left 06/25/2013   Procedure: EXCISION LEFT TEMPORAL MASS;  Surgeon: Fannie Knee, MD;  Location: [place];  Service: ENT;  Laterality: Left;   TONSILLECTOMY       Home Medications:  Prior to Admission medications   Medication Sig Start Date End Date Taking? Authorizing Provider  albuterol (VENTOLIN HFA) 108 (90 Base) MCG/ACT inhaler Inhale 2 puffs into the lungs every 6 (six) hours as needed for wheezing or shortness of breath. 12/20/20  Yes Pokhrel, Laxman, MD  allopurinol (ZYLOPRIM) 300 MG tablet Take 300 mg by mouth daily. 06/21/12  Yes [provider]  ALPRAZolam (XANAX) 0.25 MG tablet Take 1 tablet (0.25 mg total) by mouth 2 (two) times daily as needed for anxiety. 02/05/22  Yes Kathie Dike, MD  apixaban (ELIQUIS) 2.5 MG TABS tablet Take 1 tablet (2.5 mg total) by mouth 2 (two) times daily. 12/29/21  Yes Little Ishikawa, MD  cholecalciferol (VITAMIN D3) 25 MCG (1000 UNIT) tablet Take 1,000 Units by mouth daily.   Yes [provider]  clopidogrel (PLAVIX) 75 MG tablet Take 1 tablet (75 mg total) by mouth daily. 12/29/21  Yes Little Ishikawa, MD  Cyanocobalamin  (VITAMIN B 12 PO) Take 1 tablet by mouth daily.   Yes [provider]  ferrous sulfate 325 (65 FE) MG tablet Take 1 tablet (325 mg total) by mouth 2 (two) times daily with a meal. 03/13/22  Yes Tat, Shanon Brow, MD  folic acid (FOLVITE) 1 MG tablet Take 1 tablet (1 mg total) by mouth daily. 12/29/21  Yes Little Ishikawa, MD  gabapentin (NEURONTIN) 300 MG capsule Take 1 capsule (300 mg total) by mouth at bedtime. 12/29/21  Yes Little Ishikawa, MD  hydrALAZINE (APRESOLINE) 25 MG tablet Take 1 tablet (25 mg total) by mouth 2 (two) times daily. 03/13/22  Yes Tat, Shanon Brow, MD  insulin glargine (LANTUS SOLOSTAR) 100 UNIT/ML Solostar Pen Inject 35 Units into the skin daily. Dose per sliding scale. 02/05/22  Yes Kathie Dike, MD  isosorbide dinitrate (ISORDIL) 20 MG tablet Take 1 tablet (20 mg total) by mouth 2 (two) times daily. 12/29/21  Yes Little Ishikawa, MD  metoprolol succinate (TOPROL-XL) 50 MG 24 hr tablet Take 1 tablet (50 mg total) by mouth  daily. Take with or immediately following a meal. 02/06/22  Yes Memon, Jolaine Artist, MD  mometasone-formoterol (DULERA) 200-5 MCG/ACT AERO Inhale 2 puffs into the lungs in the morning and at bedtime. 02/05/22  Yes Kathie Dike, MD  nitroGLYCERIN (NITROSTAT) 0.4 MG SL tablet    Yes [provider]  pantoprazole (PROTONIX) 40 MG tablet Take 40 mg by mouth daily with breakfast. 08/19/14  Yes [provider]  rosuvastatin (CRESTOR) 10 MG tablet Take 1 tablet (10 mg total) by mouth every evening. 12/29/21  Yes Little Ishikawa, MD  sertraline (ZOLOFT) 25 MG tablet Take 1 tablet (25 mg total) by mouth every evening. 01/09/21  Yes Thayer Headings, PMHNP  tamsulosin (FLOMAX) 0.4 MG CAPS capsule Take 0.4 mg by mouth daily. 09/22/14  Yes [provider]  thiamine 100 MG tablet Take 1 tablet (100 mg total) by mouth daily. 12/30/21  Yes Little Ishikawa, MD  torsemide (DEMADEX) 20 MG tablet Take 2 tablets (40 mg total) by mouth  daily. Patient taking differently: Take 40 mg by mouth 2 (two) times daily. 02/05/22  Yes Kathie Dike, MD  HYDROcodone-acetaminophen (NORCO/VICODIN) 5-325 MG tablet Take 1 tablet by mouth every 8 (eight) hours as needed for moderate pain. Patient not taking: Reported on 03/16/2022 02/05/22 02/05/23  Kathie Dike, MD  insulin aspart (NOVOLOG) 100 UNIT/ML injection Inject 7 Units into the skin 3 (three) times daily with meals. Patient not taking: Reported on 03/23/2022 02/05/22   Kathie Dike, MD  Insulin Pen Needle 32G X 4 MM MISC Use to inject insulin up to 4 times daily. 12/29/21   Little Ishikawa, MD  ipratropium-albuterol (DUONEB) 0.5-2.5 (3) MG/3ML SOLN Take 3 mLs by nebulization 3 (three) times daily. Patient not taking: Reported on 03/16/2022 02/05/22   Kathie Dike, MD  Multiple Vitamins-Minerals (CERTAVITE/ANTIOXIDANTS) TABS Take 1 tablet by mouth daily. Patient not taking: Reported on 03/23/2022 12/29/21   Little Ishikawa, MD    Inpatient Medications: Scheduled Meds:  [START ON 03/24/2022] allopurinol  300 mg Oral Daily   apixaban  2.5 mg Oral BID   [START ON 03/24/2022] clopidogrel  75 mg Oral Daily   ferrous sulfate  325 mg Oral BID WC   furosemide  80 mg Intravenous BID   gabapentin  300 mg Oral QHS   hydrALAZINE  25 mg Oral BID   insulin aspart  0-6 Units Subcutaneous TID WC   insulin aspart  7 Units Subcutaneous TID WC   insulin detemir  15 Units Subcutaneous BID   [START ON 03/24/2022] metoprolol succinate  50 mg Oral Daily   mometasone-formoterol  2 puff Inhalation BID   nitroGLYCERIN  1 inch Topical Q6H   [START ON 03/24/2022] pantoprazole  40 mg Oral Q breakfast   rosuvastatin  10 mg Oral QPM   sertraline  25 mg Oral QPM   sodium chloride flush  3 mL Intravenous Q12H   [START ON 03/24/2022] tamsulosin  0.4 mg Oral Daily   [START ON 03/24/2022] thiamine  100 mg Oral Daily   Continuous Infusions:  PRN Meds: acetaminophen **OR** acetaminophen, albuterol,  ALPRAZolam  Allergies:    Allergies  Allergen Reactions   Brilinta [Ticagrelor] Shortness Of Breath   Penicillins Hives   Ezetimibe-Simvastatin Other (See Comments)    Myalgia     Social History:   Social History   Socioeconomic History   Marital status: Widowed    Spouse name: Not on file   Number of children: 3   Years of education: Not  on file   Highest education level: Not on file  Occupational History   Not on file  Tobacco Use   Smoking status: Former    Types: Cigarettes    Quit date: 05/19/1989    Years since quitting: 32.8   Smokeless tobacco: Never  Vaping Use   Vaping Use: Never used  Substance and Sexual Activity   Alcohol use: Not Currently   Drug use: No   Sexual activity: Not on file  Other Topics Concern   Not on file  Social History Narrative   Not on file   Social Determinants of Health   Financial Resource Strain: Not on file  Food Insecurity: No Food Insecurity   Worried About Running Out of Food in the Last Year: Never true   [place] in the Last Year: Never true  Transportation Needs: No Transportation Needs   Lack of Transportation (Medical): No   Lack of Transportation (Non-Medical): No  Physical Activity: Not on file  Stress: Not on file  Social Connections: Not on file  Intimate Partner Violence: Not on file    Family History:    Family History  Problem Relation Age of Onset   Diabetes Other    Diabetes Mother    Diabetes Maternal Aunt    Depression Other    Depression Other    Drug abuse Daughter    Diabetes Daughter      ROS:  Please see the history of present illness.   All other ROS reviewed and negative.     Physical Exam/Data:   Vitals:   03/23/22 1130 03/23/22 1145 03/23/22 1315 03/23/22 1330  BP: (!) 107/96 (!) 143/76 133/77 (!) 132/106  Pulse: 78 81 75 75  Resp: 20 19 18    Temp:      TempSrc:      SpO2: 100% 99% 99% 99%  Weight:      Height:       No intake or output data in the 24 hours  ending 03/23/22 1344    03/23/2022    7:35 AM 03/12/2022    6:22 AM 03/11/2022    5:00 AM  Last 3 Weights  Weight (lbs) 184 lb 8 oz 182 lb 12.8 oz 185 lb 6.4 oz  Weight (kg) 83.689 kg 82.918 kg 84.097 kg     Body mass index is 28.9 kg/m.  General:  Chronically ill, frail elderly gentleman sitting in the bed in no acute distress  HEENT: normal Neck: no JVD Vascular: Radial pulses 2+ bilaterally Cardiac:  normal S1, S2; RRR; no murmur Lungs:  Crackles in bilateral lung bases  Abd: distended, tight, nontender  Ext: 2+ edema in left lower extremity, 1+ edema in right lower extremity  Musculoskeletal:  No deformities, BUE and BLE strength normal and equal Skin: warm and dry  Neuro:  CNs 2-12 intact, no focal abnormalities noted Psych:  Normal affect   EKG:  The EKG was personally reviewed and demonstrates:  Sinus rhythm with PVCs, HR 89 BPM,  Telemetry:  Telemetry was personally reviewed and demonstrates:  Sinus rhythm, PVCs   Relevant CV Studies:  Echocardiogram (limited) 03/08/22 1. Limited study.   2. Poor acoustic windows.   3. LVEF is severely depressed with hypokinesis worse in the lateral,  anterior and apical walls . Left ventricular ejection fraction, by  estimation, is 25 to 30%. The left ventricle has severely decreased  function. The left ventricular internal cavity  size was moderately dilated. There is mild left  ventricular hypertrophy.   4. Right ventricular systolic function is normal. The right ventricular  size is normal.   5. The inferior vena cava is dilated in size with <50% respiratory  variability, suggesting right atrial pressure of 15 mmHg.   Laboratory Data:  High Sensitivity Troponin:   Recent Labs  Lab 03/23/22 0737 03/23/22 0920  TROPONINIHS 112* 168*     Chemistry Recent Labs  Lab 03/23/22 0737  NA 139  K 3.5  CL 99  CO2 29  GLUCOSE 176*  BUN 56*  CREATININE 2.23*  CALCIUM 8.8*  GFRNONAA 29*  ANIONGAP 11    Recent Labs  Lab  03/23/22 0737  PROT 5.9*  ALBUMIN 3.1*  AST 16  ALT 13  ALKPHOS 51  BILITOT 0.9   Lipids No results for input(s): CHOL, TRIG, HDL, LABVLDL, LDLCALC, CHOLHDL in the last 168 hours.  Hematology Recent Labs  Lab 03/23/22 0737  WBC 12.1*  RBC 3.18*  HGB 8.8*  HCT 29.8*  MCV 93.7  MCH 27.7  MCHC 29.5*  RDW 17.9*  PLT 141*   Thyroid No results for input(s): TSH, FREET4 in the last 168 hours.  BNP Recent Labs  Lab 03/23/22 0737  BNP 769.5*    DDimer No results for input(s): DDIMER in the last 168 hours.   Radiology/Studies:  DG Chest Portable 1 View  Result Date: 03/23/2022 CLINICAL DATA:  Provided history: Shortness of breath. EXAM: PORTABLE CHEST 1 VIEW COMPARISON:  Prior chest radiographs 03/06/2022 and earlier. FINDINGS: Cardiomegaly. Aortic atherosclerosis. Prominence of the interstitial lung markings and subtle bilateral airspace opacities, likely reflecting pulmonary edema. Persistent small to moderate right pleural effusion with associated right basilar atelectasis. No evidence of pneumothorax. No acute bony abnormality identified. Degenerative changes of the spine. IMPRESSION: No significant change from the prior chest radiograph of 03/06/2022. Cardiomegaly with pulmonary edema. Persistent small to moderate right pleural effusion with associated right basilar atelectasis. Aortic Atherosclerosis (ICD10-I70.0). Electronically Signed   By: Kellie Simmering D.O.   On: 03/23/2022 08:22     Assessment and Plan:   Acute on Chronic HFrEF  Ischemic cardiomyopathy  - Most recent echo (limited) on 03/08/22 showed EF 25-30%, severely decreased LV function - Patient has a history of CAD, DESx2 to LAD in 2013 (EF 40%). NSTEMI in 12/2021 that was managed medically due to AKI (EF 35%)  - CXR on admission showed persistent small to moderate right pleural effusion, cardiomegaly with pulmonary edeam  - BNP was elevated to 769.5 - Continue home metoprolol 50 mg daily, hydralazine 25 mg BID,  isordil 20 mg BID  - GDMT is limited by renal function (no ACE/ARB/ANRI, spiro, SGLT2i)  - Was on torsemide 40 mg BID pta - Started on lasix 80 mg IV BID. I agree with this dose given creatinine 2.23 on admission - Strict I/Os, daily weights.  - Patient has had 3 admissions for dyspnea/CHF exacerbation since 01/09/22. Patient wishes to discuss cardiac catheterization to fix his sob. Discussed that patient is likely a poor candidate as he has chronic kidney disease and the procedure could cause serious damage to kidneys. Also discussed that while is shortness of breath could be an anginal equivalent, his SOB is likely multifactorial as he also has COPD. Cath is not guaranteed to fix his breathing.  - If patient does not improved after a few days of diuresis, can consider RHC to determine how much CHF is contributing to his breathing issues   Elevated Troponin  CAD  - hsTn  112>>168  - Patient denies any chest pain or symptoms similar to previous heart attacks, does have some chest tightness on deep inhalation. Improved with nitro   - Suspect demand ischemia in the setting of CHF, CKD - Continue plavix, not on ASA due to eliquis use  - Continue metoprolol - Continue statin   Paroxysmal Atrial Fibrillation  - Maintaining sinus rhythm per telemetry  - Continue eliquis 2.5 mg BID (dose reduced due to age, renal function)  - Continue metoprolol   CKD stage 4  - Monitor renal function with diuresis   Otherwise per primary  - Type 2 DM  - Thrombocytopenia  - PVD  - Anxiety  - COPD  - OSA   Risk Assessment/Risk Scores:      New York Heart Association (NYHA) Functional Class NYHA Class III  CHA2DS2-VASc Score = 6  This indicates a 9.7% annual risk of stroke. The patient's score is based upon: CHF History: 1 HTN History: 1 Diabetes History: 1 Stroke History: 0 Vascular Disease History: 1 Age Score: 2 Gender Score: 0         For questions or updates, please contact [facility] Please consult [email or web address] for contact info under    Signed, Margie Billet, PA-C  03/23/2022 1:44 PM  I have seen and examined the patient along with Margie Billet, PA-C.  I have reviewed the chart, notes and new data.  I agree with PA/NP's note.  Key new complaints: mild chest tightness, improving. Severe dyspnea on arrival, also improved. Roughly 300 mL UO so far per patient. Key examination changes: prominent JVD to angle of jaw, no rales. No edema, protuberant (distended?) abdomen, not tender Key new findings / data: reviewed creatinine and BNP trend over last several months  PLAN: He is clearly hypervolemic on exam. I do not think that his chest tightness signifies an acute coronary syndrome, but rather subendocardial ischemia due to elevated filling pressures in the setting of what is likely to be severe CAD. I think he has had repeated readmissions at least in part due to the fact that he may had not have been fully diuresed to euvolemia.  This is suggested by his BNP levels.  We will diurese over the weekend and then plan a right heart catheterization on Monday to see if we have achieved optimal filling pressures.  In addition, right heart catheterization could help [place] separate the pulmonary versus cardiac components of his dyspnea. It is possible that his renal function baseline is no longer around 2.2, but higher, and this has impeded appropriate diuresis. We discussed the risk/benefit of performing left heart catheterization.  Based on echocardiographic findings, I think it is likely that he now has multivessel CAD.  He is not a good candidate for surgical revascularization.  PCI is not likely to lead to significant improvement in left ventricular function.  Contrast administration would be associated with a very high risk of contrast nephrotoxicity and even progression to end-stage renal disease.  I do not think he would be a good candidate for chronic dialysis.   All told, I believe the benefit of left heart catheterization would be limited and the risk of complications (specifically renal failure) would be high. In my opinion, left heart catheterization and coronary angiography should be performed only if he is clearly having an ST segment elevation myocardial infarction, as a lifesaving procedure. We will follow along this weekend.  Sanda Klein, MD, [place] [phone] 03/23/2022, 2:30 PM

## 2022-03-23 NOTE — H&P (Signed)
History and Physical    Patient: John [name] NOB:[id number] DOB: 09/28/1941 DOA: 03/23/2022 DOS: the patient was seen and examined on 03/23/2022 PCP: [name], DO  Patient coming from: Home  Chief Complaint:  Chief Complaint  Patient presents with   Shortness of Breath   HPI: John [name] is a 81 y.o. 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  Patient has been hospitalized each month for the last 3 months.  Last hospitalization 5/16-5/23 with acute on chronic respiratory failure with hypoxia secondary to COPD exacerbation as well as combined systolic and diastolic heart failure. Limited echo noting EF to be around 25-30% with hypokinesis worse in the lateral, anterior, and apical walls and trivial pericardial effusion.  He had been discharged home on torsemide 40 mg twice daily.  Patient reports that he has been taking  all the medications as prescribed.  He has been checking his weight daily, and noticed that his weight had increased a total of 4-4.5 pounds since leaving the hospital.  He had not noticed any significant lower extremity swelling.  He had recently gotten a hospital bed last week, and had been sleeping in the incline position.  Son notes that he previously had been able to sleep lying flat prior to receiving the bed.  Yesterday, he was unable to sleep due to feeling short of breath.  He ended up sitting on the toilet bent over with some relief.  Reported associated symptoms of neck tightness.  He tried taking a nitroglycerin at home with some mild relief.  Denied having any significant cough, fever, or leg swelling. O2 saturations on home 4 L were reported to be in the 70-80s.  On admission into the emergency department patient was noted to be afebrile with  respirations 19-24, blood pressures maintained, and O2 saturations noted to be as low as 83% on room air with improvement on 4 L of nasal cannula oxygen.  Labs significant for WBC 12.1, hemoglobin 8.8, platelet 141, BUN 56, creatinine 2.23, glucose 176, high-sensitivity troponin 112->168, and BNP 769.5.  Influenza and COVID-19 screening were negative.  Chest x-ray noting no significant change from previous chest radiograph from 5/16 of cardiomegaly with pulmonary edema.  Review of Systems: As mentioned in the history of present illness. All other systems reviewed and are negative. 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)    BPH (benign prostatic hyperplasia)    CAD (coronary artery disease)    CHF (congestive heart failure) (HCC)    Cholesteatoma of attic    Depression    Diabetes mellitus ([place])    Ear disease    GERD (gastroesophageal reflux disease)    Hyperlipidemia    Hypertension    PVD (peripheral vascular disease) ([place])    Stroke [facility])    Past Surgical History:  Procedure Laterality Date   BONE ANCHORED HEARING AID IMPLANT Left 06/25/2013   Procedure: BONE ANCHORED HEARING AID (BAHA) IMPLANT;  Surgeon: Fannie Knee, MD;  Location: [facility];  Service: ENT;  Laterality: Left;   CAROTID STENT INSERTION Left    COCHLEAR IMPLANT     CORONARY STENT PLACEMENT     MASS EXCISION Left 06/25/2013   Procedure: EXCISION LEFT TEMPORAL MASS;  Surgeon: Fannie Knee, MD;  Location: [facility];  Service: ENT;  Laterality: Left;   TONSILLECTOMY     Social History:  reports that he quit smoking about 32 years ago. His smoking use included cigarettes. He has never used smokeless tobacco. He reports that he does not currently use alcohol. He reports that he does not use drugs.  Allergies  Allergen Reactions   Brilinta [Ticagrelor] Shortness Of Breath   Penicillins Hives   Ezetimibe-Simvastatin Other (See Comments)    Myalgia     Family  History  Problem Relation Age of Onset   Diabetes Other    Diabetes Mother    Diabetes Maternal Aunt    Depression Other    Depression Other    Drug abuse Daughter    Diabetes Daughter     Prior to Admission medications   Medication Sig Start Date End Date Taking? Authorizing Provider  albuterol (VENTOLIN HFA) 108 (90 Base) MCG/ACT inhaler Inhale 2 puffs into the lungs every 6 (six) hours as needed for wheezing or shortness of breath. 12/20/20  Yes Pokhrel, [place], MD  allopurinol (ZYLOPRIM) 300 MG tablet Take 300 mg by mouth daily. 06/21/12  Yes [provider]  ALPRAZolam (XANAX) 0.25 MG tablet Take 1 tablet (0.25 mg total) by mouth 2 (two) times daily as needed for anxiety. 02/05/22  Yes Kathie Dike, MD  apixaban (ELIQUIS) 2.5 MG TABS tablet Take 1 tablet (2.5 mg total) by mouth 2 (two) times daily. 12/29/21  Yes Little Ishikawa, MD  cholecalciferol (VITAMIN D3) 25 MCG (1000 UNIT) tablet Take 1,000 Units by mouth daily.   Yes [provider]  clopidogrel (PLAVIX) 75 MG tablet Take 1 tablet (75 mg total) by mouth daily. 12/29/21  Yes Little Ishikawa, MD  Cyanocobalamin (VITAMIN B 12 PO) Take 1 tablet by mouth daily.   Yes [provider]  ferrous sulfate 325 (65 FE) MG tablet Take 1 tablet (325 mg total) by mouth 2 (two) times daily with a meal. 03/13/22  Yes Tat, Shanon Brow, MD  folic acid (FOLVITE) 1 MG tablet Take 1 tablet (1 mg total) by mouth daily. 12/29/21  Yes Little Ishikawa, MD  gabapentin (NEURONTIN) 300 MG capsule Take 1 capsule (300 mg total) by mouth at bedtime. 12/29/21  Yes Little Ishikawa, MD  hydrALAZINE (APRESOLINE) 25 MG tablet Take 1 tablet (25 mg total) by mouth 2 (two) times daily. 03/13/22  Yes Tat, Shanon Brow, MD  insulin glargine (LANTUS SOLOSTAR) 100 UNIT/ML Solostar Pen Inject 35 Units into the skin daily. Dose per sliding scale. 02/05/22  Yes Kathie Dike, MD  isosorbide dinitrate (ISORDIL) 20 MG tablet Take 1 tablet (20 mg  total) by mouth 2 (two) times daily. 12/29/21  Yes Little Ishikawa, MD  metoprolol succinate (TOPROL-XL) 50 MG 24 hr tablet Take 1 tablet (50 mg total) by mouth daily. Take with or immediately following a meal. 02/06/22  Yes Memon, Jolaine Artist, MD  mometasone-formoterol (DULERA) 200-5 MCG/ACT AERO Inhale 2 puffs into the lungs in the morning and at bedtime. 02/05/22  Yes Kathie Dike, MD  nitroGLYCERIN (NITROSTAT) 0.4 MG SL tablet    Yes [provider]  pantoprazole (PROTONIX) 40 MG tablet Take 40 mg by mouth daily with breakfast. 08/19/14  Yes [provider]  rosuvastatin (CRESTOR) 10 MG tablet Take 1 tablet (10 mg total) by mouth every evening. 12/29/21  Yes Little Ishikawa, MD  sertraline (ZOLOFT) 25 MG tablet Take 1 tablet (25 mg total) by mouth every evening. 01/09/21  Yes Thayer Headings, [place]  tamsulosin (FLOMAX) 0.4 MG CAPS capsule Take 0.4 mg by mouth daily. 09/22/14  Yes [provider]  thiamine 100 MG tablet Take 1 tablet (100 mg total) by mouth daily. 12/30/21  Yes Little Ishikawa, MD  torsemide (DEMADEX) 20 MG tablet Take 2 tablets (40 mg total) by mouth daily. Patient taking differently: Take 40 mg by mouth 2 (two) times daily. 02/05/22  Yes Kathie Dike, MD  HYDROcodone-acetaminophen (NORCO/VICODIN) 5-325 MG tablet Take 1 tablet by mouth every 8 (eight) hours as needed for moderate pain. Patient not taking: Reported on 03/16/2022 02/05/22 02/05/23  Kathie Dike, MD  insulin aspart (NOVOLOG) 100 UNIT/ML injection Inject 7 Units into the skin 3 (three) times daily with meals. Patient not taking: Reported on 03/23/2022 02/05/22   Kathie Dike, MD  Insulin Pen Needle 32G X 4 MM MISC Use to inject insulin up to 4 times daily. 12/29/21   Little Ishikawa, MD  ipratropium-albuterol (DUONEB) 0.5-2.5 (3) MG/3ML SOLN Take 3 mLs by nebulization 3 (three) times daily. Patient not taking: Reported on 03/16/2022 02/05/22   Kathie Dike, MD  Multiple  Vitamins-Minerals (CERTAVITE/ANTIOXIDANTS) TABS Take 1 tablet by mouth daily. Patient not taking: Reported on 03/23/2022 12/29/21   Little Ishikawa, MD    Physical Exam: Vitals:   03/23/22 0945 03/23/22 1000 03/23/22 1015 03/23/22 1030  BP: 139/75 (!) 140/93 139/88 128/73  Pulse: 83 81 81 81  Resp:  20  20  Temp:      TempSrc:      SpO2: 97% 98% 98% 97%  Weight:      Height:       Constitutional: Chronically ill appearing elderly male currently in no acute distress Eyes: PERRL, lids and conjunctivae normal ENMT: Mucous membranes are moist.   JVD appears present.  Patient hard of hearing.   Neck: normal, supple, no masses, no thyromegaly Respiratory: Normal respiratory effort with intermittent crackles noted on both lung fields. O2 saturations currently maintained on 4 L nasal cannula oxygen. Cardiovascular: Regular rate and rhythm, no murmurs / rubs / gallops. No extremity edema. 2+ pedal pulses. No carotid bruits.  Abdomen: no tenderness, no masses palpated.  Bowel sounds positive.  Musculoskeletal: no clubbing / cyanosis. No joint deformity upper and lower extremities.   Skin: no rashes, lesions, ulcers. No induration Neurologic: CN 2-12 grossly intact. Sensation intact, DTR normal. Strength 5/5 in all 4.  Psychiatric: Normal judgment and insight. Alert and oriented x 3. Normal mood.   Data Reviewed:  EKG reveals sinus rhythm with premature atrial complexes unchanged from prior  Assessment and Plan: Combined systolic and diastolic CHF Acute on chronic.  Patient presented with complaints of worsening shortness of breath and orthopnea.  On physical exam patient with intermittent crackles noted throughout the mid and lower lungs bilaterally.  BNP 769.5.  Chest x-ray showing similar appearance to previous admission.  Limited echocardiogram noted EF to be 25 to 30% with hypokinesis worse in the lateral, anterior, and apical walls and trivial pericardial effusion.  Records note  patient's been admitted each month for the last 3 months with similar complaints. -Admit to a cardiac telemetry bed -Heart failure order set utilized -Strict I&Os and daily weights -Lasix 80 mg IV twice daily -Appreciate cardiology consultative services, we will follow-up for any further recommendations  Elevated troponin chest pain Acute on chronic.  Patient presented with complaints of pain in his throat as well as shortness of breath.  Initial High-sensitivity troponins 112->168 which is higher than prior.  EKG similar to prior.  He reports some mild relief with nitroglycerin.  Nitroglycerin paste was placed in the ED.  Suspect secondary to demand in setting of heart failure exacerbation -Will follow-up with cardiology for any additional recommendation  Chronic respiratory failure with hypoxia COPD, without acute exacerbation Patient initially reported to have O2 saturations in 80s on home 4 L.  However, O2 saturations currently maintained on 4 L of oxygen.  Suspect patient's increased shortness of breath likely secondary -Continuous pulse oximetry with nasal cannula oxygen with oxygen maintain O2 saturation -Continue Dulera  -DuoNeb breathing treatments as needed  Essential hypertension Systolic blood pressures currently maintained in the 140-150s. Home blood pressure regimen includes hydralazine 25 mg twice daily, isosorbide mononitrate 20 mg twice daily, metoprolol succinate 50 mg daily, and torsemide 40 mg twice daily. -Continue hydralazine and metoprolol as tolerated -Held torsemide while on IV diuresis and held isosorbide mononitrate while nitroglycerin paste in place  Paroxysmal atrial fibrillation chronic anticoagulation Appears to be in sinus rhythm at this time.  He reports that he has been compliant with Eliquis. -Continue Eliquis  CAD s/p CABG Patient currently reported that pain had resolved.  No significant ischemic changes noted on -Continue Plavix, statin,  beta-blocker, and resume Imdur when medically appropriate  Chronic kidney disease stage 4 Creatinine 2.23 with BUN 56.  Creatinine appears to similar prior hospitalization last month. Continue to monitor with IV diuresis  Diabetes mellitus type 2 Last hemoglobin A1c 7.1 on 12/24/2021.  On admission glucose 172.  Home regimen includes -Hypoglycemic protocol -Levemir 15 units twice daily -NovoLog 7 units with meals -CBGs before every meal with very sensitive SSI -Continue gabapentin  Thrombocytopenia Acute.  Platelet count 141, but had been within normal limits during last admission.  Review of records note patient's platelet counts have been as low as 125 during his prior admission in 12/2021. -Continue to monitor  Mixed hyperlipidemia -Continue Crestor  Peripheral vascular disease S/p right fem-pop bypass at [facility] in 05/2018.  -Continue Plavix and statin therapy.   Anxiety -Continue Zoloft and Xanax as needed  BPH Currently denies having any urinary retention. -Continue Flomax   Advance Care Planning:   Code Status: Full Code.  Verified with son present Consults: Cardiology Family Communication: Son Updated at bedside  Severity of Illness: The appropriate patient status for this patient is INPATIENT. Inpatient status is judged to be reasonable and necessary in order to provide the required intensity of service to ensure the patient's safety. The patient's presenting symptoms, physical exam findings, and initial radiographic and laboratory data in the context of their chronic comorbidities is felt to place them at high risk for further clinical deterioration. Furthermore, it is not anticipated that the patient will be medically stable for discharge from the hospital within 2 midnights of admission.   * I certify that at the point of admission it is my clinical judgment that the patient will require inpatient hospital care spanning beyond 2 midnights from the point of  admission due to high intensity of service, high risk for further deterioration and high frequency of surveillance required.*  Author: Norval Morton, MD 03/23/2022 11:00 AM  For on call review [email or web address].

## 2022-03-23 NOTE — ED Provider Notes (Signed)
[facility] EMERGENCY DEPARTMENT Provider Note   CSN: [id number] Arrival date & time: 03/23/22  0731     History  Chief Complaint  Patient presents with   Shortness of Breath    John [name] is a 81 y.o. male.  HPI Patient has history of CHF and COPD.  At baseline patient is on 4 L of oxygen.  He reports just over the past 3 days his physician had him on an increased dose of Lasix.  He has been taking 40 mg twice daily and was supposed to go back to his normal dose today.  Patient reports yesterday evening he got more short of breath than usual and felt a tightness in his throat.  Yesterday during the day was a fairly normal day.  The evening he felt a slight tightness in the base of his throat and then overnight felt more short of breath than usual.  He reports he had to sit up on the edge of the bed.  His inhalers were not helping.  At this time he reports he still feels a slight tightness of the chest.  He had taken a nitroglycerin at home which helped.  Has not had any swelling in the legs.  He reports typically when he gets swelling from CHF its in his abdomen.  He does feel that his abdomen is slightly more distended than usual.  No fevers no chills no productive cough    Home Medications Prior to Admission medications   Medication Sig Start Date End Date Taking? Authorizing Provider  albuterol (VENTOLIN HFA) 108 (90 Base) MCG/ACT inhaler Inhale 2 puffs into the lungs every 6 (six) hours as needed for wheezing or shortness of breath. 12/20/20  Yes Pokhrel, Laxman, MD  allopurinol (ZYLOPRIM) 300 MG tablet Take 300 mg by mouth daily. 06/21/12  Yes [provider]  ALPRAZolam (XANAX) 0.25 MG tablet Take 1 tablet (0.25 mg total) by mouth 2 (two) times daily as needed for anxiety. 02/05/22  Yes Kathie Dike, MD  apixaban (ELIQUIS) 2.5 MG TABS tablet Take 1 tablet (2.5 mg total) by mouth 2 (two) times daily. 12/29/21  Yes Little Ishikawa, MD  cholecalciferol  (VITAMIN D3) 25 MCG (1000 UNIT) tablet Take 1,000 Units by mouth daily.   Yes [provider]  clopidogrel (PLAVIX) 75 MG tablet Take 1 tablet (75 mg total) by mouth daily. 12/29/21  Yes Little Ishikawa, MD  Cyanocobalamin (VITAMIN B 12 PO) Take 1 tablet by mouth daily.   Yes [provider]  ferrous sulfate 325 (65 FE) MG tablet Take 1 tablet (325 mg total) by mouth 2 (two) times daily with a meal. 03/13/22  Yes Tat, Shanon Brow, MD  folic acid (FOLVITE) 1 MG tablet Take 1 tablet (1 mg total) by mouth daily. 12/29/21  Yes Little Ishikawa, MD  gabapentin (NEURONTIN) 300 MG capsule Take 1 capsule (300 mg total) by mouth at bedtime. 12/29/21  Yes Little Ishikawa, MD  hydrALAZINE (APRESOLINE) 25 MG tablet Take 1 tablet (25 mg total) by mouth 2 (two) times daily. 03/13/22  Yes Tat, Shanon Brow, MD  insulin glargine (LANTUS SOLOSTAR) 100 UNIT/ML Solostar Pen Inject 35 Units into the skin daily. Dose per sliding scale. 02/05/22  Yes Kathie Dike, MD  isosorbide dinitrate (ISORDIL) 20 MG tablet Take 1 tablet (20 mg total) by mouth 2 (two) times daily. 12/29/21  Yes Little Ishikawa, MD  metoprolol succinate (TOPROL-XL) 50 MG 24 hr tablet Take 1 tablet (50 mg total)  by mouth daily. Take with or immediately following a meal. 02/06/22  Yes Memon, Jolaine Artist, MD  mometasone-formoterol (DULERA) 200-5 MCG/ACT AERO Inhale 2 puffs into the lungs in the morning and at bedtime. 02/05/22  Yes Kathie Dike, MD  nitroGLYCERIN (NITROSTAT) 0.4 MG SL tablet    Yes [provider]  pantoprazole (PROTONIX) 40 MG tablet Take 40 mg by mouth daily with breakfast. 08/19/14  Yes [provider]  rosuvastatin (CRESTOR) 10 MG tablet Take 1 tablet (10 mg total) by mouth every evening. 12/29/21  Yes Little Ishikawa, MD  sertraline (ZOLOFT) 25 MG tablet Take 1 tablet (25 mg total) by mouth every evening. 01/09/21  Yes Thayer Headings, PMHNP  tamsulosin (FLOMAX) 0.4 MG CAPS capsule Take 0.4 mg by  mouth daily. 09/22/14  Yes [provider]  thiamine 100 MG tablet Take 1 tablet (100 mg total) by mouth daily. 12/30/21  Yes Little Ishikawa, MD  torsemide (DEMADEX) 20 MG tablet Take 2 tablets (40 mg total) by mouth daily. Patient taking differently: Take 40 mg by mouth 2 (two) times daily. 02/05/22  Yes Kathie Dike, MD  HYDROcodone-acetaminophen (NORCO/VICODIN) 5-325 MG tablet Take 1 tablet by mouth every 8 (eight) hours as needed for moderate pain. Patient not taking: Reported on 03/16/2022 02/05/22 02/05/23  Kathie Dike, MD  insulin aspart (NOVOLOG) 100 UNIT/ML injection Inject 7 Units into the skin 3 (three) times daily with meals. Patient not taking: Reported on 03/23/2022 02/05/22   Kathie Dike, MD  Insulin Pen Needle 32G X 4 MM MISC Use to inject insulin up to 4 times daily. 12/29/21   Little Ishikawa, MD  ipratropium-albuterol (DUONEB) 0.5-2.5 (3) MG/3ML SOLN Take 3 mLs by nebulization 3 (three) times daily. Patient not taking: Reported on 03/16/2022 02/05/22   Kathie Dike, MD  Multiple Vitamins-Minerals (CERTAVITE/ANTIOXIDANTS) TABS Take 1 tablet by mouth daily. Patient not taking: Reported on 03/23/2022 12/29/21   Little Ishikawa, MD      Allergies    Brilinta [ticagrelor], Penicillins, and Ezetimibe-simvastatin    Review of Systems   Review of Systems 10 systems reviewed negative except as per HPI Physical Exam Updated Vital Signs BP 128/73   Pulse 81   Temp 98.2 F (36.8 C) (Oral)   Resp 20   Ht 5\' 7"  (1.702 m)   Wt 83.7 kg   SpO2 97%   BMI 28.90 kg/m  Physical Exam Constitutional:      Comments: Alert.  Clear mental status.  Mild increased work of breathing at rest.  Speaking in full sentences without difficulty  HENT:     Head: Normocephalic and atraumatic.     Mouth/Throat:     Pharynx: Oropharynx is clear.  Eyes:     Extraocular Movements: Extraocular movements intact.  Cardiovascular:     Comments: Heart regular with occasional  ectopic beat.  I do not appreciate significant murmur or gallop. Pulmonary:     Comments: Mild increased work of breathing.  Breath sounds soft.  More so on the right lung than left.  Left has airflow the bases.  No significant crackles appreciated. Abdominal:     Comments: Abdomen is slightly protuberant but soft and pliable.  Nontender.  No pitting edema.  Musculoskeletal:     Comments: No edema of the lower extremities.  Lower legs and feet are in good condition.  Skin:    General: Skin is warm and dry.  Neurological:     General: No focal deficit present.  Mental Status: He is oriented to person, place, and time.     Comments: No focal weakness.  Speech is clear.  Normal orientation  Psychiatric:        Mood and Affect: Mood normal.    ED Results / Procedures / Treatments   Labs (all labs ordered are listed, but only abnormal results are displayed) Labs Reviewed  CBC - Abnormal; Notable for the following components:      Result Value   WBC 12.1 (*)    RBC 3.18 (*)    Hemoglobin 8.8 (*)    HCT 29.8 (*)    MCHC 29.5 (*)    RDW 17.9 (*)    Platelets 141 (*)    nRBC 0.3 (*)    All other components within normal limits  BRAIN NATRIURETIC PEPTIDE - Abnormal; Notable for the following components:   B Natriuretic Peptide 769.5 (*)    All other components within normal limits  COMPREHENSIVE METABOLIC PANEL - Abnormal; Notable for the following components:   Glucose, Bld 176 (*)    BUN 56 (*)    Creatinine, Ser 2.23 (*)    Calcium 8.8 (*)    Total Protein 5.9 (*)    Albumin 3.1 (*)    GFR, Estimated 29 (*)    All other components within normal limits  TROPONIN I (HIGH SENSITIVITY) - Abnormal; Notable for the following components:   Troponin I (High Sensitivity) 112 (*)    All other components within normal limits  TROPONIN I (HIGH SENSITIVITY) - Abnormal; Notable for the following components:   Troponin I (High Sensitivity) 168 (*)    All other components within normal  limits  RESP PANEL BY RT-PCR (FLU A&B, COVID) [id number]  URINALYSIS, ROUTINE W REFLEX MICROSCOPIC    EKG EKG Interpretation  Date/Time:  Friday March 23 2022 07:34:06 EDT Ventricular Rate:  89 PR Interval:  142 QRS Duration: 102 QT Interval:  406 QTC Calculation: 493 R Axis:   93 Text Interpretation: Sinus rhythm with occasional Premature ventricular complexes Lateral infarct , age undetermined Abnormal ECG When compared with ECG of 06-Mar-2022 12:03, PREVIOUS ECG IS PRESENT no sig change from previous Confirmed by Charlesetta Shanks [phone]) on 03/23/2022 7:58:13 AM  Radiology DG Chest Portable 1 View  Result Date: 03/23/2022 CLINICAL DATA:  Provided history: Shortness of breath. EXAM: PORTABLE CHEST 1 VIEW COMPARISON:  Prior chest radiographs 03/06/2022 and earlier. FINDINGS: Cardiomegaly. Aortic atherosclerosis. Prominence of the interstitial lung markings and subtle bilateral airspace opacities, likely reflecting pulmonary edema. Persistent small to moderate right pleural effusion with associated right basilar atelectasis. No evidence of pneumothorax. No acute bony abnormality identified. Degenerative changes of the spine. IMPRESSION: No significant change from the prior chest radiograph of 03/06/2022. Cardiomegaly with pulmonary edema. Persistent small to moderate right pleural effusion with associated right basilar atelectasis. Aortic Atherosclerosis (ICD10-I70.0). Electronically Signed   By: Kellie Simmering D.O.   On: 03/23/2022 08:22    Procedures Procedures   CRITICAL CARE Performed by: Charlesetta Shanks   Total critical care time: 30 minutes  Critical care time was exclusive of separately billable procedures and treating other patients.  Critical care was necessary to treat or prevent imminent or life-threatening deterioration.  Critical care was time spent personally by me on the following activities: development of treatment plan with patient and/or surrogate as well as nursing,  discussions with consultants, evaluation of patient's response to treatment, examination of patient, obtaining history from patient or surrogate, ordering and performing treatments and interventions,  ordering and review of laboratory studies, ordering and review of radiographic studies, pulse oximetry and re-evaluation of patient's condition.  Medications Ordered in ED Medications  nitroGLYCERIN (NITROGLYN) 2 % ointment 1 inch (has no administration in time range)    ED Course/ Medical Decision Making/ A&P                           Medical Decision Making Amount and/or Complexity of Data Reviewed Labs: ordered. Radiology: ordered.  Risk Prescription drug management. Decision regarding hospitalization.  Patient has pre-existing history of CHF and COPD with advanced age.  Risk for significant comorbid conditions.  Patient describes some chest tightness.  EKG has been reviewed by myself not showing acute change from previous.  At this time no STEMI criteria.  Patient's blood pressures are stable.  He is oxygenating in the high 90s on his baseline 4 L nasal cannula.  Proceed with diagnostic evaluation for ACS\CHF\COPD\pneumonia.  Patient has been taking Lasix 80 mg daily.  Will await review of chest x-ray and renal function before additional dosing of diuretic.  EKG does not show acute changes.  Patient has mild troponin elevation.  ACS versus demand ischemia with CHF.  Patient has had increased diuresis at home without success.  We will plan for admission for suspected CHF and rule out MI.  Nitroglycerin paste applied, patient has taken his a.m. dose of Eliquis.  Will not opt to change anticoagulation at this point.  Cardiology has been consulted for further recommendation.  Blood pressures, heart rate and oxygenation are stable at this point but with significant risk of possible decompensated CHF or worsening ACS significant medical comorbidities.  Consult: Dr. Tamala Julian for  admission.         Final Clinical Impression(s) / ED Diagnoses Final diagnoses:  ACS (acute coronary syndrome) (HCC)  SOB (shortness of breath)    Rx / DC Orders ED Discharge Orders     None         Charlesetta Shanks, MD 03/23/22 1105

## 2022-03-24 DIAGNOSIS — E1169 Type 2 diabetes mellitus with other specified complication: Secondary | ICD-10-CM

## 2022-03-24 DIAGNOSIS — F32A Depression, unspecified: Secondary | ICD-10-CM

## 2022-03-24 DIAGNOSIS — N184 Chronic kidney disease, stage 4 (severe): Secondary | ICD-10-CM | POA: Diagnosis not present

## 2022-03-24 DIAGNOSIS — I10 Essential (primary) hypertension: Secondary | ICD-10-CM | POA: Diagnosis not present

## 2022-03-24 DIAGNOSIS — E785 Hyperlipidemia, unspecified: Secondary | ICD-10-CM

## 2022-03-24 DIAGNOSIS — I25708 Atherosclerosis of coronary artery bypass graft(s), unspecified, with other forms of angina pectoris: Secondary | ICD-10-CM | POA: Diagnosis not present

## 2022-03-24 DIAGNOSIS — I480 Paroxysmal atrial fibrillation: Secondary | ICD-10-CM | POA: Diagnosis not present

## 2022-03-24 DIAGNOSIS — J9611 Chronic respiratory failure with hypoxia: Secondary | ICD-10-CM | POA: Diagnosis not present

## 2022-03-24 DIAGNOSIS — I5043 Acute on chronic combined systolic (congestive) and diastolic (congestive) heart failure: Secondary | ICD-10-CM | POA: Diagnosis not present

## 2022-03-24 LAB — CBC
HCT: 27.3 % — ABNORMAL LOW (ref 39.0–52.0)
Hemoglobin: 8.4 g/dL — ABNORMAL LOW (ref 13.0–17.0)
MCH: 28.2 pg (ref 26.0–34.0)
MCHC: 30.8 g/dL (ref 30.0–36.0)
MCV: 91.6 fL (ref 80.0–100.0)
Platelets: 127 10*3/uL — ABNORMAL LOW (ref 150–400)
RBC: 2.98 MIL/uL — ABNORMAL LOW (ref 4.22–5.81)
RDW: 18.2 % — ABNORMAL HIGH (ref 11.5–15.5)
WBC: 9.1 10*3/uL (ref 4.0–10.5)
nRBC: 0.4 % — ABNORMAL HIGH (ref 0.0–0.2)

## 2022-03-24 LAB — GLUCOSE, CAPILLARY
Glucose-Capillary: 63 mg/dL — ABNORMAL LOW (ref 70–99)
Glucose-Capillary: 92 mg/dL (ref 70–99)
Glucose-Capillary: 187 mg/dL — ABNORMAL HIGH (ref 70–99)
Glucose-Capillary: 70 mg/dL (ref 70–99)
Glucose-Capillary: 273 mg/dL — ABNORMAL HIGH (ref 70–99)
Glucose-Capillary: 317 mg/dL — ABNORMAL HIGH (ref 70–99)

## 2022-03-24 LAB — MAGNESIUM: Magnesium: 2 mg/dL (ref 1.7–2.4)

## 2022-03-24 LAB — BASIC METABOLIC PANEL
Anion gap: 11 (ref 5–15)
BUN: 57 mg/dL — ABNORMAL HIGH (ref 8–23)
CO2: 26 mmol/L (ref 22–32)
Calcium: 8.7 mg/dL — ABNORMAL LOW (ref 8.9–10.3)
Chloride: 101 mmol/L (ref 98–111)
Creatinine, Ser: 1.95 mg/dL — ABNORMAL HIGH (ref 0.61–1.24)
GFR, Estimated: 34 mL/min — ABNORMAL LOW (ref >60)
Glucose, Bld: 74 mg/dL (ref 70–99)
Potassium: 3.1 mmol/L — ABNORMAL LOW (ref 3.5–5.1)
Sodium: 138 mmol/L (ref 135–145)

## 2022-03-24 MED ORDER — INSULIN ASPART 100 UNIT/ML IJ SOLN
3.0000 [IU] | Freq: Three times a day (TID) | INTRAMUSCULAR | Status: DC
  Administered 2022-03-25 – 2022-03-31 (×17): 3 [IU] via SUBCUTANEOUS

## 2022-03-24 MED ORDER — POTASSIUM CHLORIDE CRYS ER 20 MEQ PO TBCR
40.0000 meq | EXTENDED_RELEASE_TABLET | ORAL | Status: AC
  Administered 2022-03-24 (×2): 40 meq via ORAL
  Filled 2022-03-24 (×2): qty 2

## 2022-03-24 MED ORDER — INSULIN DETEMIR 100 UNIT/ML ~~LOC~~ SOLN
15.0000 [IU] | Freq: Every day | SUBCUTANEOUS | Status: DC
  Administered 2022-03-25 – 2022-03-31 (×6): 15 [IU] via SUBCUTANEOUS
  Filled 2022-03-24 (×7): qty 0.15

## 2022-03-24 MED ORDER — CALCIUM CARBONATE ANTACID 500 MG PO CHEW
1.0000 | CHEWABLE_TABLET | Freq: Two times a day (BID) | ORAL | Status: DC | PRN
  Administered 2022-03-24 – 2022-03-26 (×3): 200 mg via ORAL
  Filled 2022-03-24 (×3): qty 1

## 2022-03-24 NOTE — Assessment & Plan Note (Addendum)
02/2022 echocardiogram wit poor acoustic windows. Significant reduction in LV systolic function with EF 25 to 30%, hypokinesis in the lateral anterior and apical walls. RV with preserved systolic function. (in March no significant valvular abnormalities).   Elevated troponin due to heart failure exacerbation and subendocardial ischemic, ruled out for acute coronary syndrome.   Documented urine output over last 24 hrs is 650 ml. Blood pressure has been 128 to 127 mmHg.   Plan to continue diuresis with furosemide 80 mg IV Limited pharmacological options due to reduced GFR.  Continue with metoprolol and hydralazine.   Acute on chronic hypoxemic respiratory failure. Acute cardiogenic pulmonary edema. His oxygenation today is 95% on 4 L/min per [place] Continue supplemental 02 per [place] to keep 02 saturation 88% or greater.

## 2022-03-24 NOTE — Progress Notes (Signed)
Progress Note   Patient: John [name] JOI:[id number] DOB: 03-26-1941 DOA: 03/23/2022     1 DOS: the patient was seen and examined on 03/24/2022   Brief hospital course: John [name] was admitted to the hospital with the working diagnosis of decompensated heart failure.   81 yo male with the past medical history of hypertension, dyslipidemia, coronary artery disease, systolic heart failure, CKD stage IV, chronic hypoxemic respiratory failure, asbestosis, and T2DM who presented with dyspnea. Patient had frequent hospitalizations for heart failure and COPD exacerbation, last one from 05/16 to 05/23. At home he noted weight gain 4 to 4,5 lbs weight gain, despite being compliant with his medications. 24 hrs of orthopnea and 02 desaturation down to 70 to 80% on supplemental 02 per [place] 4 L/min. On his initial physical examination his blood pressure was 139/75, HR 83, RR 20 and 02 saturation 97% on supplemental 02. Lungs with rales bilaterally, heart with S1 and S2 present, rhythmic, abdomen not distended, no lower extremity edema.   NA 139, K 3,5 CL 99, bicarbonate 29, glucose 175, bun 56 cr 2,23 BNP 769 High sensitive troponin 112 and 168  Wbc 12,1 hgb 8,8 plt 141  Sars covid 19 negative   Chest radiograph with cardiomegaly, bilateral hilar vascular congestion with cephalization of the vasculature, right pleural effusion and bilateral lower lobes interstitial infiltrates more right than left.   EKG 89 bpm, normal axis, normal intervals, sinus rhythm with PVC, no significant ST segment changes, negative T in V 6, poor R wave progression.   Patient has been placed on furosemide for diuresis.   Assessment and Plan: * Acute on chronic combined systolic and diastolic CHF (congestive heart failure) ([place]) 02/2022 echocardiogram wit poor acoustic windows. Significant reduction in LV systolic function with EF 25 to 30%, hypokinesis in the lateral anterior and apical walls. RV with preserved systolic function. (in  March no significant valvular abnormalities).   Elevated troponin due to heart failure exacerbation and subendocardial ischemic, ruled out for acute coronary syndrome.   Documented urine output over last 24 hrs is 650 ml. Blood pressure has been 128 to 127 mmHg.   Plan to continue diuresis with furosemide 80 mg IV Limited pharmacological options due to reduced GFR.  Continue with metoprolol and hydralazine.   Acute on chronic hypoxemic respiratory failure. Acute cardiogenic pulmonary edema. His oxygenation today is 95% on 4 L/min per [place] Continue supplemental 02 per [place] to keep 02 saturation 88% or greater.     Essential hypertension Continue aggressive diuresis with IV furosemide Close blood pressure monitoring.  Blood pressure control with metoprolol and hydralazine.   Paroxysmal atrial fibrillation (HCC) Heart rate has been controlled with metoprolol.  Continue anticoagulation with apixaban.   CAD (coronary artery disease) of artery bypass graft No acute coronary syndrome. Continue medical therapy with statin, and blood pressure control with metoprolol.   CKD (chronic kidney disease) stage 4, GFR 15-29 ml/min (HCC) Hypokalemia  Renal function with serum cr at 1,95 with K at 3,1 and serum bicarbonate at 26. Mg is 2,0  Plan to continue diuresis with furosemide 80 mg IV q12 hrs to target negative fluid balance.  Add K cl 40 meq x2 and follow up renal function in am.   Type 2 diabetes mellitus with hyperlipidemia (HCC) Fasting glucose this am is 74, capillary glucose 63, 92, 187.  Will plan to decrease basal insulin to 15 units daily and decrease pre meal insulin to 3 units, to prevent hypoglycemia.  Continue with insulin  sliding scale for glucose cover and monitoring.   Continue with statin therapy.    Thrombocytopenia (HCC) Stable cell count   Iron deficiency anemia. Continue with oral iron supplementation.   Peripheral vascular disease (HCC) Continue statin,  clopidogrel and blood pressure control.  Follow up as outpatient.   BPH (benign prostatic hyperplasia) No clinical signs of urinary retention.  Continue with tamsulosin.   Depression Anxiety.   Continue with sertraline and alprazolam.         Subjective: patient with improvement in dyspnea but not back to baseline, no chest pain   Physical Exam: Vitals:   03/23/22 2304 03/23/22 2311 03/24/22 0730 03/24/22 1115  BP:  114/87 (!) 128/54 127/74  Pulse:  81 80 (!) 44  Resp:  20 18 20   Temp:  98.1 F (36.7 C)  97.6 F (36.4 C)  TempSrc:  Oral  Oral  SpO2: 95%  97% 94%  Weight:      Height:       Neurology awake and alert ENT with mild pallor Cardiovascular with S1 and S2 present, irregularly irregular with no gallops or murmurs Positive JVD No lower extremity edema. Respiratory with bilateral rales, with no wheezing Abdomen not distended  Data Reviewed:    Family Communication: no family at the bedside   Disposition: Status is: Inpatient Remains inpatient appropriate because: heart failure   Planned Discharge Destination: Home  Author: Tawni Millers, MD 03/24/2022 2:10 PM  For on call review [email or web address].

## 2022-03-24 NOTE — Assessment & Plan Note (Addendum)
Stable cell count   Iron deficiency anemia. Continue with oral iron supplementation.

## 2022-03-24 NOTE — Assessment & Plan Note (Signed)
No acute coronary syndrome. Continue medical therapy with statin, and blood pressure control with metoprolol.

## 2022-03-24 NOTE — Assessment & Plan Note (Addendum)
No clinical signs of urinary retention.  Continue with tamsulosin.

## 2022-03-24 NOTE — Assessment & Plan Note (Addendum)
Continue aggressive diuresis with IV furosemide Close blood pressure monitoring.  Blood pressure control with metoprolol and hydralazine.

## 2022-03-24 NOTE — Hospital Course (Signed)
John [name] was admitted to the hospital with the working diagnosis of decompensated heart failure.   81 yo male with the past medical history of hypertension, dyslipidemia, coronary artery disease, systolic heart failure, CKD stage IV, chronic hypoxemic respiratory failure, asbestosis, and T2DM who presented with dyspnea. Patient had frequent hospitalizations for heart failure and COPD exacerbation, last one from 05/16 to 05/23. At home he noted weight gain 4 to 4,5 lbs weight gain, despite being compliant with his medications. 24 hrs of orthopnea and 02 desaturation down to 70 to 80% on supplemental 02 per  4 L/min. On his initial physical examination his blood pressure was 139/75, HR 83, RR 20 and 02 saturation 97% on supplemental 02. Lungs with rales bilaterally, heart with S1 and S2 present, rhythmic, abdomen not distended, no lower extremity edema.   NA 139, K 3,5 CL 99, bicarbonate 29, glucose 175, bun 56 cr 2,23 BNP 769 High sensitive troponin 112 and 168  Wbc 12,1 hgb 8,8 plt 141  Sars covid 19 negative   Chest radiograph with cardiomegaly, bilateral hilar vascular congestion with cephalization of the vasculature, right pleural effusion and bilateral lower lobes interstitial infiltrates more right than left.   EKG 89 bpm, normal axis, normal intervals, sinus rhythm with PVC, no significant ST segment changes, negative T in V 6, poor R wave progression.   Patient has been placed on furosemide for diuresis.

## 2022-03-24 NOTE — Assessment & Plan Note (Signed)
Heart rate has been controlled with metoprolol.  Continue anticoagulation with apixaban.

## 2022-03-24 NOTE — Assessment & Plan Note (Signed)
Anxiety.   Continue with sertraline and alprazolam.

## 2022-03-24 NOTE — Progress Notes (Addendum)
Progress Note  Patient Name: John [name] Date of Encounter: 03/24/2022  [facility] Cardiologist: Kirk Ruths, MD   Subjective   Breathing has improved.  Does not feel that he has orthopnea.  No edema.  Ins/out not fully recorded while he was still in the emergency room. Weight down 2 pounds since admission. Renal parameters are improving.  Inpatient Medications    Scheduled Meds:  allopurinol  300 mg Oral Daily   apixaban  2.5 mg Oral BID   clopidogrel  75 mg Oral Daily   ferrous sulfate  325 mg Oral BID WC   furosemide  80 mg Intravenous BID   gabapentin  300 mg Oral QHS   hydrALAZINE  25 mg Oral BID   insulin aspart  0-6 Units Subcutaneous TID WC   insulin aspart  7 Units Subcutaneous TID WC   insulin detemir  15 Units Subcutaneous BID   metoprolol succinate  50 mg Oral Daily   mometasone-formoterol  2 puff Inhalation BID   nitroGLYCERIN  1 inch Topical Q6H   pantoprazole  40 mg Oral Q breakfast   rosuvastatin  10 mg Oral QPM   sertraline  25 mg Oral QPM   sodium chloride flush  3 mL Intravenous Q12H   tamsulosin  0.4 mg Oral Daily   thiamine  100 mg Oral Daily   Continuous Infusions:  PRN Meds: acetaminophen **OR** acetaminophen, albuterol, ALPRAZolam   Vital Signs    Vitals:   03/23/22 2207 03/23/22 2304 03/23/22 2311 03/24/22 0730  BP:   114/87 (!) 128/54  Pulse: 82  81 80  Resp: 20  20 18   Temp:   98.1 F (36.7 C)   TempSrc:   Oral   SpO2: 97% 95%  97%  Weight:      Height:        Intake/Output Summary (Last 24 hours) at 03/24/2022 0854 Last data filed at 03/24/2022 0600 Gross per 24 hour  Intake 420 ml  Output 650 ml  Net -230 ml      03/23/2022    3:26 PM 03/23/2022    7:35 AM 03/12/2022    6:22 AM  Last 3 Weights  Weight (lbs) 182 lb 8.7 oz 184 lb 8 oz 182 lb 12.8 oz  Weight (kg) 82.8 kg 83.689 kg 82.918 kg      Telemetry    Normal sinus rhythm- Personally Reviewed  ECG    No new tracing- Personally Reviewed  Physical Exam   Mildly tachypneic at rest GEN: No acute distress.   Neck: 8 cm JVD Cardiac: RRR, no murmurs, rubs, or gallops.  Respiratory: Clear to auscultation bilaterally.  No wheezes.  Diminished in bases bilaterally GI: Soft, nontender, non-distended  MS: No edema; No deformity. Neuro:  Nonfocal  Psych: Normal affect   Labs    High Sensitivity Troponin:   Recent Labs  Lab 03/23/22 0737 03/23/22 0920  TROPONINIHS 112* 168*     Chemistry Recent Labs  Lab 03/23/22 0737 03/24/22 0311  NA 139 138  K 3.5 3.1*  CL 99 101  CO2 29 26  GLUCOSE 176* 74  BUN 56* 57*  CREATININE 2.23* 1.95*  CALCIUM 8.8* 8.7*  MG  --  2.0  PROT 5.9*  --   ALBUMIN 3.1*  --   AST 16  --   ALT 13  --   ALKPHOS 51  --   BILITOT 0.9  --   GFRNONAA 29* 34*  ANIONGAP 11 11    Lipids  No results for input(s): CHOL, TRIG, HDL, LABVLDL, LDLCALC, CHOLHDL in the last 168 hours.  Hematology Recent Labs  Lab 03/23/22 0737 03/24/22 0311  WBC 12.1* 9.1  RBC 3.18* 2.98*  HGB 8.8* 8.4*  HCT 29.8* 27.3*  MCV 93.7 91.6  MCH 27.7 28.2  MCHC 29.5* 30.8  RDW 17.9* 18.2*  PLT 141* 127*   Thyroid No results for input(s): TSH, FREET4 in the last 168 hours.  BNP Recent Labs  Lab 03/23/22 0737  BNP 769.5*    DDimer No results for input(s): DDIMER in the last 168 hours.   Radiology    DG Chest Portable 1 View  Result Date: 03/23/2022 CLINICAL DATA:  Provided history: Shortness of breath. EXAM: PORTABLE CHEST 1 VIEW COMPARISON:  Prior chest radiographs 03/06/2022 and earlier. FINDINGS: Cardiomegaly. Aortic atherosclerosis. Prominence of the interstitial lung markings and subtle bilateral airspace opacities, likely reflecting pulmonary edema. Persistent small to moderate right pleural effusion with associated right basilar atelectasis. No evidence of pneumothorax. No acute bony abnormality identified. Degenerative changes of the spine. IMPRESSION: No significant change from the prior chest radiograph of 03/06/2022.  Cardiomegaly with pulmonary edema. Persistent small to moderate right pleural effusion with associated right basilar atelectasis. Aortic Atherosclerosis (ICD10-I70.0). Electronically Signed   By: Kellie Simmering D.O.   On: 03/23/2022 08:22    Cardiac Studies   Echocardiogram (limited) 03/08/22 1. Limited study.   2. Poor acoustic windows.   3. LVEF is severely depressed with hypokinesis worse in the lateral,  anterior and apical walls . Left ventricular ejection fraction, by  estimation, is 25 to 30%. The left ventricle has severely decreased  function. The left ventricular internal cavity  size was moderately dilated. There is mild left ventricular hypertrophy.   4. Right ventricular systolic function is normal. The right ventricular  size is normal.   5. The inferior vena cava is dilated in size with <50% respiratory  variability, suggesting right atrial pressure of 15 mmHg.     Patient Profile     81 y.o. 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  I suspect readmission is related to insufficient diuresis.  We will try to push diuretics during this hospitalization and then perform right heart catheterization to confirm normalization of left heart filling pressures (hopefully on Monday).  2. CAD/Ischemic cardiomyopathy  - Most recent echo (limited) on 03/08/22 showed EF 25-30%, severely decreased LV function.  NSTEMI earlier this year. - Patient has a history of CAD, DESx2 to LAD in 2013 (EF 40%). NSTEMI in 12/2021 that was managed medically due to AKI (EF 35%)  -Currently without angina -Current minor elevation in  troponin is consistent with subendocardial ischemia due to heart failure exacerbation, rather than a new acute coronary event.   - hsTn 112>>168  - Continue plavix, not on ASA due to eliquis use  - Continue metoprolol - Continue statin    3. Paroxysmal Atrial Fibrillation  - Maintaining sinus rhythm per telemetry  - Continue eliquis 2.5 mg BID (dose reduced due to age, renal function)  - Continue metoprolol    4. CKD stage 4  - Monitor daily.  Note improved creatinine with diuresis in first 24 hours.  5.  Chronic respiratory sufficiency with hypoxia: related to COPD, asbestosis, longstanding obstructive sleep apnea.  Right heart catheterization will help to separate contribution of heart failure versus pulmonary disease to his shortness of breath.  6. Anemia: Surprising degree of anemia considering chronic hypoxemia.  Normocytic indices with very broad RDW.  Labs confirm iron deficiency, but has normal B12 and folate levels.  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  Also has mild thrombocytopenia during this admission.    - Type 2 DM   - PVD       For questions or updates, please contact [place] Please consult [email or web address] for contact info under        Signed, Sanda Klein, MD  03/24/2022, 8:54 AM

## 2022-03-24 NOTE — Assessment & Plan Note (Addendum)
Continue statin, clopidogrel and blood pressure control.  Follow up as outpatient.

## 2022-03-24 NOTE — Assessment & Plan Note (Addendum)
Fasting glucose this am is 74, capillary glucose 63, 92, 187.  Will plan to decrease basal insulin to 15 units daily and decrease pre meal insulin to 3 units, to prevent hypoglycemia.  Continue with insulin sliding scale for glucose cover and monitoring.   Continue with statin therapy.

## 2022-03-24 NOTE — Assessment & Plan Note (Signed)
Hypokalemia  Renal function with serum cr at 1,95 with K at 3,1 and serum bicarbonate at 26. Mg is 2,0  Plan to continue diuresis with furosemide 80 mg IV q12 hrs to target negative fluid balance.  Add K cl 40 meq x2 and follow up renal function in am.

## 2022-03-25 DIAGNOSIS — N401 Enlarged prostate with lower urinary tract symptoms: Secondary | ICD-10-CM | POA: Diagnosis not present

## 2022-03-25 DIAGNOSIS — N184 Chronic kidney disease, stage 4 (severe): Secondary | ICD-10-CM | POA: Diagnosis not present

## 2022-03-25 DIAGNOSIS — I25708 Atherosclerosis of coronary artery bypass graft(s), unspecified, with other forms of angina pectoris: Secondary | ICD-10-CM | POA: Diagnosis not present

## 2022-03-25 DIAGNOSIS — I5043 Acute on chronic combined systolic (congestive) and diastolic (congestive) heart failure: Secondary | ICD-10-CM | POA: Diagnosis not present

## 2022-03-25 LAB — BASIC METABOLIC PANEL
Anion gap: 8 (ref 5–15)
BUN: 54 mg/dL — ABNORMAL HIGH (ref 8–23)
CO2: 27 mmol/L (ref 22–32)
Calcium: 8.8 mg/dL — ABNORMAL LOW (ref 8.9–10.3)
Chloride: 99 mmol/L (ref 98–111)
Creatinine, Ser: 1.91 mg/dL — ABNORMAL HIGH (ref 0.61–1.24)
GFR, Estimated: 34 mL/min — ABNORMAL LOW (ref >60)
Glucose, Bld: 189 mg/dL — ABNORMAL HIGH (ref 70–99)
Potassium: 4 mmol/L (ref 3.5–5.1)
Sodium: 134 mmol/L — ABNORMAL LOW (ref 135–145)

## 2022-03-25 LAB — GLUCOSE, CAPILLARY
Glucose-Capillary: 180 mg/dL — ABNORMAL HIGH (ref 70–99)
Glucose-Capillary: 208 mg/dL — ABNORMAL HIGH (ref 70–99)
Glucose-Capillary: 217 mg/dL — ABNORMAL HIGH (ref 70–99)
Glucose-Capillary: 220 mg/dL — ABNORMAL HIGH (ref 70–99)
Glucose-Capillary: 231 mg/dL — ABNORMAL HIGH (ref 70–99)

## 2022-03-25 MED ORDER — SODIUM CHLORIDE 0.9 % IV SOLN: INTRAVENOUS | Status: DC

## 2022-03-25 MED ORDER — SODIUM CHLORIDE 0.9 % IV SOLN
250.0000 mL | INTRAVENOUS | Status: DC | PRN
Start: 2022-03-25 — End: 2022-03-26

## 2022-03-25 MED ORDER — SODIUM CHLORIDE 0.9% FLUSH
3.0000 mL | INTRAVENOUS | Status: DC | PRN
Start: 2022-03-25 — End: 2022-03-26

## 2022-03-25 MED ORDER — POTASSIUM CHLORIDE CRYS ER 20 MEQ PO TBCR
40.0000 meq | EXTENDED_RELEASE_TABLET | Freq: Every day | ORAL | Status: DC
  Administered 2022-03-25: 40 meq via ORAL
  Filled 2022-03-25: qty 2

## 2022-03-25 MED ORDER — SODIUM CHLORIDE 0.9% FLUSH
3.0000 mL | Freq: Two times a day (BID) | INTRAVENOUS | Status: DC
  Administered 2022-03-25 – 2022-03-30 (×4): 3 mL via INTRAVENOUS

## 2022-03-25 NOTE — H&P (View-Only) (Signed)
Progress Note  Patient Name: John [name] Date of Encounter: 03/25/2022  [facility] Cardiologist: Kirk Ruths, MD   Subjective   Feels well.  Able to lie almost fully supine in bed without dyspnea. Additional 1 liter net diuresis over last 24h. Weight unchanged though. Renal parameters continue slight improving trend.  Inpatient Medications    Scheduled Meds:  allopurinol  300 mg Oral Daily   apixaban  2.5 mg Oral BID   clopidogrel  75 mg Oral Daily   ferrous sulfate  325 mg Oral BID WC   furosemide  80 mg Intravenous BID   gabapentin  300 mg Oral QHS   hydrALAZINE  25 mg Oral BID   insulin aspart  0-6 Units Subcutaneous TID WC   insulin aspart  3 Units Subcutaneous TID WC   insulin detemir  15 Units Subcutaneous Daily   metoprolol succinate  50 mg Oral Daily   mometasone-formoterol  2 puff Inhalation BID   nitroGLYCERIN  1 inch Topical Q6H   pantoprazole  40 mg Oral Q breakfast   rosuvastatin  10 mg Oral QPM   sertraline  25 mg Oral QPM   sodium chloride flush  3 mL Intravenous Q12H   tamsulosin  0.4 mg Oral Daily   thiamine  100 mg Oral Daily   Continuous Infusions:  PRN Meds: acetaminophen **OR** acetaminophen, albuterol, ALPRAZolam, calcium carbonate   Vital Signs    Vitals:   03/25/22 0013 03/25/22 0512 03/25/22 0750 03/25/22 0808  BP: 120/70 (!) 116/56    Pulse: 73 68    Resp: 20 20    Temp: 98.5 F (36.9 C) (!) 97.4 F (36.3 C)    TempSrc: Axillary Axillary    SpO2: 100% 100% 100% 95%  Weight:  83.3 kg    Height:        Intake/Output Summary (Last 24 hours) at 03/25/2022 0809 Last data filed at 03/25/2022 0600 Gross per 24 hour  Intake 1023 ml  Output 1820 ml  Net -797 ml      03/25/2022    5:12 AM 03/23/2022    3:26 PM 03/23/2022    7:35 AM  Last 3 Weights  Weight (lbs) 183 lb 9.6 oz 182 lb 8.7 oz 184 lb 8 oz  Weight (kg) 83.28 kg 82.8 kg 83.689 kg      Telemetry    Sinus rhythm with fairly frequent PVCs- Personally Reviewed  ECG     No new tracing- Personally Reviewed  Physical Exam  Appears comfortable GEN: No acute distress.   Neck: 3-4 cm JVD Cardiac: RRR, no murmurs, rubs, or gallops.  Respiratory: Diminished breath sounds throughout, but otherwise clear to auscultation bilaterally. GI: Soft, nontender, non-distended  MS: No edema; No deformity. Neuro:  Nonfocal  Psych: Normal affect   Labs    High Sensitivity Troponin:   Recent Labs  Lab 03/23/22 0737 03/23/22 0920  TROPONINIHS 112* 168*     Chemistry Recent Labs  Lab 03/23/22 0737 03/24/22 0311 03/25/22 0402  NA 139 138 134*  K 3.5 3.1* 4.0  CL 99 101 99  CO2 29 26 27   GLUCOSE 176* 74 189*  BUN 56* 57* 54*  CREATININE 2.23* 1.95* 1.91*  CALCIUM 8.8* 8.7* 8.8*  MG  --  2.0  --   PROT 5.9*  --   --   ALBUMIN 3.1*  --   --   AST 16  --   --   ALT 13  --   --  ALKPHOS 51  --   --   BILITOT 0.9  --   --   GFRNONAA 29* 34* 34*  ANIONGAP 11 11 8     Lipids No results for input(s): CHOL, TRIG, HDL, LABVLDL, LDLCALC, CHOLHDL in the last 168 hours.  Hematology Recent Labs  Lab 03/23/22 0737 03/24/22 0311  WBC 12.1* 9.1  RBC 3.18* 2.98*  HGB 8.8* 8.4*  HCT 29.8* 27.3*  MCV 93.7 91.6  MCH 27.7 28.2  MCHC 29.5* 30.8  RDW 17.9* 18.2*  PLT 141* 127*   Thyroid No results for input(s): TSH, FREET4 in the last 168 hours.  BNP Recent Labs  Lab 03/23/22 0737  BNP 769.5*    DDimer No results for input(s): DDIMER in the last 168 hours.   Radiology    DG Chest Portable 1 View  Result Date: 03/23/2022 CLINICAL DATA:  Provided history: Shortness of breath. EXAM: PORTABLE CHEST 1 VIEW COMPARISON:  Prior chest radiographs 03/06/2022 and earlier. FINDINGS: Cardiomegaly. Aortic atherosclerosis. Prominence of the interstitial lung markings and subtle bilateral airspace opacities, likely reflecting pulmonary edema. Persistent small to moderate right pleural effusion with associated right basilar atelectasis. No evidence of pneumothorax. No  acute bony abnormality identified. Degenerative changes of the spine. IMPRESSION: No significant change from the prior chest radiograph of 03/06/2022. Cardiomegaly with pulmonary edema. Persistent small to moderate right pleural effusion with associated right basilar atelectasis. Aortic Atherosclerosis (ICD10-I70.0). Electronically Signed   By: Kellie Simmering D.O.   On: 03/23/2022 08:22    Cardiac Studies   Echocardiogram (limited) 03/08/22 1. Limited study.   2. Poor acoustic windows.   3. LVEF is severely depressed with hypokinesis worse in the lateral,  anterior and apical walls . Left ventricular ejection fraction, by  estimation, is 25 to 30%. The left ventricle has severely decreased  function. The left ventricular internal cavity  size was moderately dilated. There is mild left ventricular hypertrophy.   4. Right ventricular systolic function is normal. The right ventricular  size is normal.   5. The inferior vena cava is dilated in size with <50% respiratory  variability, suggesting right atrial pressure of 15 mmHg.   Patient Profile     81 y.o. 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  I suspect readmission is related to insufficient diuresis.  We will try to push diuretics during this hospitalization and then perform right heart catheterization to confirm normalization of left heart filling pressures (hopefully on Monday). Baseline BNP probably 500. Recheck in AM.  The RHC procedure has been fully reviewed with the patient and written informed consent has been obtained.    2. CAD/Ischemic cardiomyopathy  -  Most recent echo (limited) on 03/08/22 showed EF 25-30%, severely decreased LV function.  NSTEMI earlier this year. - Patient has a history of CAD, DESx2 to LAD in 2013 (EF 40%). NSTEMI in 12/2021 that was managed medically due to AKI (EF 35%)  -Currently without angina -Current minor elevation in troponin is consistent with subendocardial ischemia due to heart failure exacerbation, rather than a new acute coronary event.   - We discussed the risk/benefit of performing left  heart catheterization.  Based on echocardiographic findings, I think it is likely that he now has multivessel CAD.  He is not a good candidate for surgical revascularization.  PCI is not likely to lead to significant improvement in left ventricular function. All told, I believe the benefit of left heart catheterization would be limited and the risk of complications (specifically renal failure) would be high. In my opinion, left heart catheterization and coronary angiography should be performed only if he is clearly having an ST segment elevation myocardial infarction, as a lifesaving procedure. - Continue plavix, not on ASA due to eliquis use  - Continue metoprolol - Continue statin    3. Paroxysmal Atrial Fibrillation  - Maintaining sinus rhythm per telemetry  - Continue eliquis 2.5 mg BID (dose reduced due to age, renal function)  - Continue metoprolol    4. CKD stage 4  - Monitor daily.  Note improved creatinine with diuresis in first 24 hours. -Very high risk for contrast nephrotoxicity and he would be a poor candidate for chronic hemodialysis.   5.  Chronic respiratory sufficiency with hypoxia: related to COPD, asbestosis, longstanding obstructive sleep apnea.  Right heart catheterization will help to separate contribution of heart failure versus pulmonary disease to his shortness of breath.   6. Anemia: Surprising degree of anemia considering chronic hypoxemia.  Normocytic indices with very broad RDW.  Labs confirm iron  deficiency, but has normal B12 and folate levels.  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  Also has mild thrombocytopenia during this admission.     - Type 2 DM    - PVD      For questions or updates, please contact [place] Please consult [email or web address] for contact info under        Signed, Sanda Klein, MD  03/25/2022, 8:09 AM

## 2022-03-25 NOTE — Progress Notes (Signed)
Progress Note  Patient Name: John [name] Date of Encounter: 03/25/2022  [facility] Cardiologist: Kirk Ruths, MD   Subjective   Feels well.  Able to lie almost fully supine in bed without dyspnea. Additional 1 liter net diuresis over last 24h. Weight unchanged though. Renal parameters continue slight improving trend.  Inpatient Medications    Scheduled Meds:  allopurinol  300 mg Oral Daily   apixaban  2.5 mg Oral BID   clopidogrel  75 mg Oral Daily   ferrous sulfate  325 mg Oral BID WC   furosemide  80 mg Intravenous BID   gabapentin  300 mg Oral QHS   hydrALAZINE  25 mg Oral BID   insulin aspart  0-6 Units Subcutaneous TID WC   insulin aspart  3 Units Subcutaneous TID WC   insulin detemir  15 Units Subcutaneous Daily   metoprolol succinate  50 mg Oral Daily   mometasone-formoterol  2 puff Inhalation BID   nitroGLYCERIN  1 inch Topical Q6H   pantoprazole  40 mg Oral Q breakfast   rosuvastatin  10 mg Oral QPM   sertraline  25 mg Oral QPM   sodium chloride flush  3 mL Intravenous Q12H   tamsulosin  0.4 mg Oral Daily   thiamine  100 mg Oral Daily   Continuous Infusions:  PRN Meds: acetaminophen **OR** acetaminophen, albuterol, ALPRAZolam, calcium carbonate   Vital Signs    Vitals:   03/25/22 0013 03/25/22 0512 03/25/22 0750 03/25/22 0808  BP: 120/70 (!) 116/56    Pulse: 73 68    Resp: 20 20    Temp: 98.5 F (36.9 C) (!) 97.4 F (36.3 C)    TempSrc: Axillary Axillary    SpO2: 100% 100% 100% 95%  Weight:  83.3 kg    Height:        Intake/Output Summary (Last 24 hours) at 03/25/2022 0809 Last data filed at 03/25/2022 0600 Gross per 24 hour  Intake 1023 ml  Output 1820 ml  Net -797 ml      03/25/2022    5:12 AM 03/23/2022    3:26 PM 03/23/2022    7:35 AM  Last 3 Weights  Weight (lbs) 183 lb 9.6 oz 182 lb 8.7 oz 184 lb 8 oz  Weight (kg) 83.28 kg 82.8 kg 83.689 kg      Telemetry    Sinus rhythm with fairly frequent PVCs- Personally Reviewed  ECG     No new tracing- Personally Reviewed  Physical Exam  Appears comfortable GEN: No acute distress.   Neck: 3-4 cm JVD Cardiac: RRR, no murmurs, rubs, or gallops.  Respiratory: Diminished breath sounds throughout, but otherwise clear to auscultation bilaterally. GI: Soft, nontender, non-distended  MS: No edema; No deformity. Neuro:  Nonfocal  Psych: Normal affect   Labs    High Sensitivity Troponin:   Recent Labs  Lab 03/23/22 0737 03/23/22 0920  TROPONINIHS 112* 168*     Chemistry Recent Labs  Lab 03/23/22 0737 03/24/22 0311 03/25/22 0402  NA 139 138 134*  K 3.5 3.1* 4.0  CL 99 101 99  CO2 29 26 27   GLUCOSE 176* 74 189*  BUN 56* 57* 54*  CREATININE 2.23* 1.95* 1.91*  CALCIUM 8.8* 8.7* 8.8*  MG  --  2.0  --   PROT 5.9*  --   --   ALBUMIN 3.1*  --   --   AST 16  --   --   ALT 13  --   --  ALKPHOS 51  --   --   BILITOT 0.9  --   --   GFRNONAA 29* 34* 34*  ANIONGAP 11 11 8     Lipids No results for input(s): CHOL, TRIG, HDL, LABVLDL, LDLCALC, CHOLHDL in the last 168 hours.  Hematology Recent Labs  Lab 03/23/22 0737 03/24/22 0311  WBC 12.1* 9.1  RBC 3.18* 2.98*  HGB 8.8* 8.4*  HCT 29.8* 27.3*  MCV 93.7 91.6  MCH 27.7 28.2  MCHC 29.5* 30.8  RDW 17.9* 18.2*  PLT 141* 127*   Thyroid No results for input(s): TSH, FREET4 in the last 168 hours.  BNP Recent Labs  Lab 03/23/22 0737  BNP 769.5*    DDimer No results for input(s): DDIMER in the last 168 hours.   Radiology    DG Chest Portable 1 View  Result Date: 03/23/2022 CLINICAL DATA:  Provided history: Shortness of breath. EXAM: PORTABLE CHEST 1 VIEW COMPARISON:  Prior chest radiographs 03/06/2022 and earlier. FINDINGS: Cardiomegaly. Aortic atherosclerosis. Prominence of the interstitial lung markings and subtle bilateral airspace opacities, likely reflecting pulmonary edema. Persistent small to moderate right pleural effusion with associated right basilar atelectasis. No evidence of pneumothorax. No  acute bony abnormality identified. Degenerative changes of the spine. IMPRESSION: No significant change from the prior chest radiograph of 03/06/2022. Cardiomegaly with pulmonary edema. Persistent small to moderate right pleural effusion with associated right basilar atelectasis. Aortic Atherosclerosis (ICD10-I70.0). Electronically Signed   By: Kellie Simmering D.O.   On: 03/23/2022 08:22    Cardiac Studies   Echocardiogram (limited) 03/08/22 1. Limited study.   2. Poor acoustic windows.   3. LVEF is severely depressed with hypokinesis worse in the lateral,  anterior and apical walls . Left ventricular ejection fraction, by  estimation, is 25 to 30%. The left ventricle has severely decreased  function. The left ventricular internal cavity  size was moderately dilated. There is mild left ventricular hypertrophy.   4. Right ventricular systolic function is normal. The right ventricular  size is normal.   5. The inferior vena cava is dilated in size with <50% respiratory  variability, suggesting right atrial pressure of 15 mmHg.   Patient Profile     81 y.o. 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  I suspect readmission is related to insufficient diuresis.  We will try to push diuretics during this hospitalization and then perform right heart catheterization to confirm normalization of left heart filling pressures (hopefully on Monday). Baseline BNP probably 500. Recheck in AM.  The RHC procedure has been fully reviewed with the patient and written informed consent has been obtained.    2. CAD/Ischemic cardiomyopathy  -  Most recent echo (limited) on 03/08/22 showed EF 25-30%, severely decreased LV function.  NSTEMI earlier this year. - Patient has a history of CAD, DESx2 to LAD in 2013 (EF 40%). NSTEMI in 12/2021 that was managed medically due to AKI (EF 35%)  -Currently without angina -Current minor elevation in troponin is consistent with subendocardial ischemia due to heart failure exacerbation, rather than a new acute coronary event.   - We discussed the risk/benefit of performing left  heart catheterization.  Based on echocardiographic findings, I think it is likely that he now has multivessel CAD.  He is not a good candidate for surgical revascularization.  PCI is not likely to lead to significant improvement in left ventricular function. All told, I believe the benefit of left heart catheterization would be limited and the risk of complications (specifically renal failure) would be high. In my opinion, left heart catheterization and coronary angiography should be performed only if he is clearly having an ST segment elevation myocardial infarction, as a lifesaving procedure. - Continue plavix, not on ASA due to eliquis use  - Continue metoprolol - Continue statin    3. Paroxysmal Atrial Fibrillation  - Maintaining sinus rhythm per telemetry  - Continue eliquis 2.5 mg BID (dose reduced due to age, renal function)  - Continue metoprolol    4. CKD stage 4  - Monitor daily.  Note improved creatinine with diuresis in first 24 hours. -Very high risk for contrast nephrotoxicity and he would be a poor candidate for chronic hemodialysis.   5.  Chronic respiratory sufficiency with hypoxia: related to COPD, asbestosis, longstanding obstructive sleep apnea.  Right heart catheterization will help to separate contribution of heart failure versus pulmonary disease to his shortness of breath.   6. Anemia: Surprising degree of anemia considering chronic hypoxemia.  Normocytic indices with very broad RDW.  Labs confirm iron  deficiency, but has normal B12 and folate levels.  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  Also has mild thrombocytopenia during this admission.     - Type 2 DM    - PVD      For questions or updates, please contact [place] Please consult [email or web address] for contact info under        Signed, Sanda Klein, MD  03/25/2022, 8:09 AM

## 2022-03-25 NOTE — Progress Notes (Signed)
Progress Note  Patient: John [name] [id number]:[id number] DOB: 1941/02/04  DOA: 03/23/2022  DOS: 03/25/2022    Brief hospital course: Lankford Gutzmer is an 81 y.o. male with a history of chronic 4L O2 dependence, COPD, asbestosis, combined HFrEF, PAF, CAD, T2DM, HTN, HLD, PAD, history of CVA, and stage IV CKD who presented to the ED 6/2 with shortness of breath, weight gain, and hypoxia. He was found to be volume overloaded with pulmonary edema on CXR and was readmitted for decompensated CHF. IV lasix has been given with improvement. Plan is to pursue right heart catheterization once deemed euvolemic.   Assessment and Plan: Acute on chronic HFrEF: LVEF 25-30% with anterolateral and apical hypokinesis.  - Continue lasix 80mg  IV BID - GDMT otherwise limited by stage IV CKD. Continue metoprolol, hydralazine.  - Monitor I/O (1.7L over past 24 hours), daily weights, roughly similar today at 83.3kg (previous putative EDW was 15VV/616WVP), and metabolic panel (Cr improved).  - RHC considered by cardiology, defer timing to them. Will make NPO p MN.  Demand myocardial ischemia in patient with CAD s/p CABG/PCI: Troponin elevated, though no anginal complaints.  - No acute work up or interventions planned. Continue antiplatelet, statin, BB.  PAF: Currently maintaining NSR - Continue metoprolol - Continue dose-reduced eliquis (age, renal impairment)  HTN:  - Continue metoprolol, hydralazine and diuresis.  - Restart imdur when cleared by cardiology. Wishing to avoid hypotension while diuresing with CKD.  Acute on chronic hypoxic respiratory failure: Due to acute cardiogenic pulmonary edema due to CHF decompensation, chronically related to COPD, asbestosis, OSA.  - Continue to wean supplemental oxygen to his home dose. - RHC as above.   COPD: No exacerbation currently.  - Continue bronchodilators.   OSA:  - Continue CPAP qHS.   Stage IV CKD: Creatinine relatively stable with diuresis.  - Proceed cautiously  with contrast. Pt is not likely to be a dialysis candidate.  - Avoid NSAIDs, etc.  - Monitor renal parameters closely.   Hypokalemia: Improved  - Continue supplementation along with loop diuretic  T2DM: HbA1c was 7.1% in March 2023 indicating adequate-for-age glycemic control chronically.  - Uncontrolled with hypoglycemia while admitted. Home doses glargine 35u daily, 7u TIDWC novolog), decreased glargine insulin to 15u, mealtime novolog 2u TIDWC + very sensitive SSI.   Anemia of CKD and iron deficiency: Recent folic acid, X10 are replete. - Continue po iron.  - Given chronic hypoxia, may benefit from ESA. Would defer to nephrology.   Thrombocytopenia: Relatively stable. No bleeding - Monitor again in AM  PAD: No critical ischemia. Hx fem-pop bypass Aug 2019. - Continue plavix, statin, risk factor optimization.   Depression, anxiety: Quiescent.  - Continue home sertraline and alprazolam.   BPH: No retention currently noted.  - Continue tamsulosin  HLD:  - Continue statin.   GERD:  - Continue PPI  Subjective: Breathing much better, still moderate orthopnea without chest pain. Urination of decreasing subjectively. No leg swelling currently  Objective: Vitals:   03/25/22 0750 03/25/22 0808 03/25/22 0916 03/25/22 1125  BP:   130/78 117/70  Pulse:    77  Resp:    17  Temp:    97.7 F (36.5 C)  TempSrc:    Oral  SpO2: 100% 95%  97%  Weight:      Height:       Gen: Pleasant, elderly male in no distress Pulm: Nonlabored breathing supplemental oxygen. Diminished without crackles or wheezes CV: Regular rate and rhythm. No murmur, rub, or gallop.  No pitting dependent edema. GI: Abdomen soft, non-tender, non-distended, with normoactive bowel sounds.  Ext: Warm, no deformities Skin: No rashes, lesions or ulcers on visualized skin. Neuro: Alert and oriented. No focal neurological deficits. Psych: Judgement and insight appear fair. Mood euthymic & affect congruent. Behavior is  appropriate.    Data Personally reviewed: CBC: Recent Labs  Lab 03/23/22 0737 03/24/22 0311  WBC 12.1* 9.1  HGB 8.8* 8.4*  HCT 29.8* 27.3*  MCV 93.7 91.6  PLT 141* 253*   Basic Metabolic Panel: Recent Labs  Lab 03/23/22 0737 03/24/22 0311 03/25/22 0402  NA 139 138 134*  K 3.5 3.1* 4.0  CL 99 101 99  CO2 29 26 27   GLUCOSE 176* 74 189*  BUN 56* 57* 54*  CREATININE 2.23* 1.95* 1.91*  CALCIUM 8.8* 8.7* 8.8*  MG  --  2.0  --    Liver Function Tests: Recent Labs  Lab 03/23/22 0737  AST 16  ALT 13  ALKPHOS 51  BILITOT 0.9  PROT 5.9*  ALBUMIN 3.1*   UA: Negative Flu/SARS-CoV-2 PCR: Negative  Family Communication: None at bedside  Disposition: Status is: Inpatient Remains inpatient appropriate because: Continuing IV diuresis Planned Discharge Destination: Home    Patrecia Pour, MD 03/25/2022 12:57 PM Page by [email or web address]

## 2022-03-26 ENCOUNTER — Encounter (HOSPITAL_COMMUNITY)
Admission: EM | Disposition: A | Discharge status: Home Health | Payer: Self-pay | Source: Home / Self Care | Attending: Internal Medicine

## 2022-03-26 ENCOUNTER — Encounter (HOSPITAL_COMMUNITY): Payer: Self-pay | Admitting: Cardiovascular Disease

## 2022-03-26 DIAGNOSIS — N401 Enlarged prostate with lower urinary tract symptoms: Secondary | ICD-10-CM | POA: Diagnosis not present

## 2022-03-26 DIAGNOSIS — I5043 Acute on chronic combined systolic (congestive) and diastolic (congestive) heart failure: Secondary | ICD-10-CM | POA: Diagnosis not present

## 2022-03-26 DIAGNOSIS — I25708 Atherosclerosis of coronary artery bypass graft(s), unspecified, with other forms of angina pectoris: Secondary | ICD-10-CM | POA: Diagnosis not present

## 2022-03-26 DIAGNOSIS — N184 Chronic kidney disease, stage 4 (severe): Secondary | ICD-10-CM | POA: Diagnosis not present

## 2022-03-26 HISTORY — PX: RIGHT HEART CATH: CATH118263

## 2022-03-26 LAB — CBC
HCT: 26.9 % — ABNORMAL LOW (ref 39.0–52.0)
Hemoglobin: 8.5 g/dL — ABNORMAL LOW (ref 13.0–17.0)
MCH: 28.8 pg (ref 26.0–34.0)
MCHC: 31.6 g/dL (ref 30.0–36.0)
MCV: 91.2 fL (ref 80.0–100.0)
Platelets: 139 10*3/uL — ABNORMAL LOW (ref 150–400)
RBC: 2.95 MIL/uL — ABNORMAL LOW (ref 4.22–5.81)
RDW: 18.7 % — ABNORMAL HIGH (ref 11.5–15.5)
WBC: 7.9 10*3/uL (ref 4.0–10.5)
nRBC: 0.4 % — ABNORMAL HIGH (ref 0.0–0.2)

## 2022-03-26 LAB — GLUCOSE, CAPILLARY
Glucose-Capillary: 140 mg/dL — ABNORMAL HIGH (ref 70–99)
Glucose-Capillary: 142 mg/dL — ABNORMAL HIGH (ref 70–99)
Glucose-Capillary: 265 mg/dL — ABNORMAL HIGH (ref 70–99)
Glucose-Capillary: 198 mg/dL — ABNORMAL HIGH (ref 70–99)

## 2022-03-26 LAB — POCT I-STAT EG7
Acid-Base Excess: 4 mmol/L — ABNORMAL HIGH (ref 0.0–2.0)
Acid-Base Excess: 5 mmol/L — ABNORMAL HIGH (ref 0.0–2.0)
Bicarbonate: 30.3 mmol/L — ABNORMAL HIGH (ref 20.0–28.0)
Bicarbonate: 30.5 mmol/L — ABNORMAL HIGH (ref 20.0–28.0)
Calcium, Ion: 1.21 mmol/L (ref 1.15–1.40)
Calcium, Ion: 1.25 mmol/L (ref 1.15–1.40)
HCT: 27 % — ABNORMAL LOW (ref 39.0–52.0)
HCT: 27 % — ABNORMAL LOW (ref 39.0–52.0)
Hemoglobin: 9.2 g/dL — ABNORMAL LOW (ref 13.0–17.0)
Hemoglobin: 9.2 g/dL — ABNORMAL LOW (ref 13.0–17.0)
O2 Saturation: 54 %
O2 Saturation: 57 %
Potassium: 3.8 mmol/L (ref 3.5–5.1)
Potassium: 3.9 mmol/L (ref 3.5–5.1)
Sodium: 138 mmol/L (ref 135–145)
Sodium: 139 mmol/L (ref 135–145)
TCO2: 32 mmol/L (ref 22–32)
TCO2: 32 mmol/L (ref 22–32)
pCO2, Ven: 51.4 mmHg (ref 44–60)
pCO2, Ven: 51.7 mmHg (ref 44–60)
pH, Ven: 7.378 (ref 7.25–7.43)
pH, Ven: 7.379 (ref 7.25–7.43)
pO2, Ven: 30 mmHg — CL (ref 32–45)
pO2, Ven: 31 mmHg — CL (ref 32–45)

## 2022-03-26 LAB — BASIC METABOLIC PANEL
Anion gap: 8 (ref 5–15)
BUN: 58 mg/dL — ABNORMAL HIGH (ref 8–23)
CO2: 29 mmol/L (ref 22–32)
Calcium: 9.1 mg/dL (ref 8.9–10.3)
Chloride: 99 mmol/L (ref 98–111)
Creatinine, Ser: 2.29 mg/dL — ABNORMAL HIGH (ref 0.61–1.24)
GFR, Estimated: 28 mL/min — ABNORMAL LOW (ref >60)
Glucose, Bld: 136 mg/dL — ABNORMAL HIGH (ref 70–99)
Potassium: 3.7 mmol/L (ref 3.5–5.1)
Sodium: 136 mmol/L (ref 135–145)

## 2022-03-26 LAB — MAGNESIUM: Magnesium: 2.1 mg/dL (ref 1.7–2.4)

## 2022-03-26 LAB — BRAIN NATRIURETIC PEPTIDE: B Natriuretic Peptide: 795 pg/mL — ABNORMAL HIGH (ref 0.0–100.0)

## 2022-03-26 SURGERY — RIGHT HEART CATH

## 2022-03-26 MED ORDER — SODIUM CHLORIDE 0.9% FLUSH: 3.0000 mL | INTRAVENOUS | Status: DC | PRN

## 2022-03-26 MED ORDER — FUROSEMIDE 10 MG/ML IJ SOLN
80.0000 mg | Freq: Two times a day (BID) | INTRAMUSCULAR | Status: DC
  Administered 2022-03-26 – 2022-03-27 (×2): 80 mg via INTRAVENOUS
  Filled 2022-03-26: qty 8

## 2022-03-26 MED ORDER — ACETAMINOPHEN 325 MG PO TABS: 650.0000 mg | ORAL_TABLET | ORAL | Status: DC | PRN

## 2022-03-26 MED ORDER — SODIUM CHLORIDE 0.9 % IV SOLN: 250.0000 mL | INTRAVENOUS | Status: DC | PRN

## 2022-03-26 MED ORDER — HYDRALAZINE HCL 20 MG/ML IJ SOLN: 10.0000 mg | INTRAMUSCULAR | Status: AC | PRN

## 2022-03-26 MED ORDER — ONDANSETRON HCL 4 MG/2ML IJ SOLN: 4.0000 mg | Freq: Four times a day (QID) | INTRAMUSCULAR | Status: DC | PRN

## 2022-03-26 MED ORDER — LIDOCAINE HCL (PF) 1 % IJ SOLN
INTRAMUSCULAR | Status: AC
  Filled 2022-03-26: qty 30

## 2022-03-26 MED ORDER — HEPARIN (PORCINE) IN NACL 1000-0.9 UT/500ML-% IV SOLN
INTRAVENOUS | Status: AC
  Filled 2022-03-26: qty 1000

## 2022-03-26 MED ORDER — LIDOCAINE HCL (PF) 1 % IJ SOLN
INTRAMUSCULAR | Status: DC | PRN
  Administered 2022-03-26: 2 mL

## 2022-03-26 MED ORDER — SODIUM CHLORIDE 0.9% FLUSH
3.0000 mL | Freq: Two times a day (BID) | INTRAVENOUS | Status: DC
  Administered 2022-03-26 – 2022-03-30 (×8): 3 mL via INTRAVENOUS

## 2022-03-26 MED ORDER — HEPARIN (PORCINE) IN NACL 1000-0.9 UT/500ML-% IV SOLN
INTRAVENOUS | Status: DC | PRN
  Administered 2022-03-26: 500 mL

## 2022-03-26 MED ORDER — MORPHINE SULFATE (PF) 2 MG/ML IV SOLN: 2.0000 mg | INTRAVENOUS | Status: DC | PRN

## 2022-03-26 MED ORDER — LABETALOL HCL 5 MG/ML IV SOLN: 10.0000 mg | INTRAVENOUS | Status: AC | PRN

## 2022-03-26 MED ORDER — SODIUM CHLORIDE 0.9 % IV SOLN: INTRAVENOUS | Status: AC

## 2022-03-26 SURGICAL SUPPLY — 8 items
CATH BALLN WEDGE 5F 110CM (CATHETERS) ×1 IMPLANT
PACK CARDIAC CATHETERIZATION (CUSTOM PROCEDURE TRAY) ×1 IMPLANT
PROTECTION STATION PRESSURIZED (MISCELLANEOUS) ×2
SHEATH GLIDE SLENDER 4/5FR (SHEATH) ×1 IMPLANT
STATION PROTECTION PRESSURIZED (MISCELLANEOUS) IMPLANT
TRANSDUCER W/STOPCOCK (MISCELLANEOUS) ×1 IMPLANT
TUBING ART PRESS 72  MALE/FEM (TUBING) ×1
TUBING ART PRESS 72 MALE/FEM (TUBING) IMPLANT

## 2022-03-26 NOTE — Evaluation (Signed)
Physical Therapy Evaluation Patient Details Name: John [name] MRN: [id number] DOB: Feb 07, 1941 Today's Date: 03/26/2022  History of Present Illness  The pt is an 81 yo male presenting 6/2 with SOB. Pt with mild troponin elevation, found to have acute on chronic CHF exacerbation. PMH includes: NSTEMI (12/24/21), CHF, COPD, OSA on CPAP, HTN, HLD, PVD s/p bypass, CAD s/p multiple stents, CAS s/p carotid stents, DMII, CKD IV, BPH, prior tobacco abuse, and obesity. Of note, pt with 3 admissions for CHF exacerbation since 01/09/22.   Clinical Impression  Pt in bed upon arrival of PT, agreeable to evaluation at this time. Prior to admission the pt was mobilizing with use of rollator in the home, [facility] for community mobility. The pt was independent during the day while his daughter works out of the home, reports independence with ADLs. The pt now presents with limitations in functional mobility, endurance, and dynamic stability due to above dx, and will continue to benefit from skilled PT to address these deficits. The pt was limited to 3 bouts of hallway ambulation of ~1 min each prior to need for seated rest. He was able to maintain stability with BUE support, but needed increased assist without UE support. SpO2 > 92% on 3L with all activity, but pt does stop due to SOB. Pt educated on progressive walking program, will continue to benefit from skilled PT acutely and to progress functional stability and endurance following return home.   Gait Speed: 0.44m/s using rollator and with 3L O2. (Gait speed <0.63m/s indicates increased risk of falls and dependence in ADLs)        Recommendations for follow up therapy are one component of a multi-disciplinary discharge planning process, led by the attending physician.  Recommendations may be updated based on patient status, additional functional criteria and insurance authorization.  Follow Up Recommendations Home health PT    Assistance Recommended at Discharge PRN   Patient can return home with the following  A little help with walking and/or transfers;Help with stairs or ramp for entrance;Assist for transportation;A little help with bathing/dressing/bathroom;Assistance with cooking/housework    Equipment Recommendations None recommended by PT  Recommendations for Other Services       Functional Status Assessment Patient has had a recent decline in their functional status and demonstrates the ability to make significant improvements in function in a reasonable and predictable amount of time.     Precautions / Restrictions Precautions Precautions: Fall Precaution Comments: watch O2, on 3L this session with SpO2 > 93% Restrictions Weight Bearing Restrictions: No      Mobility  Bed Mobility Overal bed mobility: Independent             General bed mobility comments: sitting EOB upon arrival, reports no issues    Transfers Overall transfer level: Modified independent Equipment used: Rollator (4 wheels)               General transfer comment: mildly increased time, no assist given.    Ambulation/Gait Ambulation/Gait assistance: Supervision Gait Distance (Feet): 75 Feet (+ 125 ft + 75 ft) Assistive device: Rolling walker (2 wheels) Gait Pattern/deviations: Step-through pattern, Decreased stride length Gait velocity: 0.45 m/s Gait velocity interpretation: 1.31 - 2.62 ft/sec, indicative of limited community ambulator   General Gait Details: pt with slow but generally steady steps, mildly increased forward lean. able to identify need for seated rest. able to ambulate for 1 min prior to needing to rest     Balance Overall balance assessment: Needs assistance Sitting-balance support:  Feet supported, No upper extremity supported Sitting balance-Leahy Scale: Good     Standing balance support: Bilateral upper extremity supported, During functional activity Standing balance-Leahy Scale: Fair Standing balance comment: static stand  without UE support, BUE support for gait                             Pertinent Vitals/Pain Pain Assessment Pain Assessment: Faces Faces Pain Scale: Hurts a little bit Pain Location: R ankle Pain Descriptors / Indicators: Discomfort Pain Intervention(s): Limited activity within patient's tolerance, Premedicated before session, Monitored during session    Home Living Family/patient expects to be discharged to:: Private residence Living Arrangements: Children Available Help at Discharge: Available PRN/intermittently Type of Home: House Home Access: Stairs to enter Entrance Stairs-Rails: Left Entrance Stairs-Number of Steps: 3   Home Layout: Two level;Able to live on main level with bedroom/bathroom Home Equipment: Cane - single point;Rollator (4 wheels);Shower seat;Wheelchair - manual;Grab bars - tub/shower;Grab bars - toilet Additional Comments: daughter works outside of home, pt independent during day    Prior Function Prior Level of Function : Needs assist           ADLs (physical): IADLs Mobility Comments: pt reports use of rollator in the home, [facility] in community. No falls. independent while daughter at work ADLs Comments: Patient is modified independent for ADL's with family assisting with other functional tasks as needed.     Hand Dominance   Dominant Hand: Right    Extremity/Trunk Assessment   Upper Extremity Assessment Upper Extremity Assessment: Overall WFL for tasks assessed    Lower Extremity Assessment Lower Extremity Assessment: Overall WFL for tasks assessed    Cervical / Trunk Assessment Cervical / Trunk Assessment: Normal  Communication   Communication: No difficulties  Cognition Arousal/Alertness: Awake/alert Behavior During Therapy: WFL for tasks assessed/performed Overall Cognitive Status: Within Functional Limits for tasks assessed                                 General Comments: pt able to answer all questions  appropriately and demos good safety awarness        General Comments General comments (skin integrity, edema, etc.): VSS on 3L O2        Assessment/Plan    PT Assessment Patient needs continued PT services;All further PT needs can be met in the next venue of care  PT Problem List Decreased strength;Decreased activity tolerance;Decreased balance;Decreased mobility;Decreased coordination       PT Treatment Interventions DME instruction;Gait training;Functional mobility training;Therapeutic activities;Therapeutic exercise;Balance training    PT Goals (Current goals can be found in the Care Plan section)  Acute Rehab PT Goals Patient Stated Goal: return home PT Goal Formulation: With patient Time For Goal Achievement: 04/09/22 Potential to Achieve Goals: Good    Frequency Min 3X/week        AM-PAC PT "6 Clicks" Mobility  Outcome Measure Help needed turning from your back to your side while in a flat bed without using bedrails?: None Help needed moving from lying on your back to sitting on the side of a flat bed without using bedrails?: None Help needed moving to and from a bed to a chair (including a wheelchair)?: None Help needed standing up from a chair using your arms (e.g., wheelchair or bedside chair)?: None Help needed to walk in hospital room?: A Little Help needed climbing 3-5 steps with a  railing? : A Little 6 Click Score: 22    End of Session Equipment Utilized During Treatment: Gait belt;Oxygen Activity Tolerance: Patient tolerated treatment well;Patient limited by fatigue;No increased pain Patient left: in bed;with call bell/phone within reach (sitting EOB) Nurse Communication: Mobility status PT Visit Diagnosis: Unsteadiness on feet (R26.81);Other abnormalities of gait and mobility (R26.89);Muscle weakness (generalized) (M62.81)    Time: 3009-7949 PT Time Calculation (min) (ACUTE ONLY): 21 min   Charges:   PT Evaluation $PT Eval Low Complexity: 1 Low           West Carbo, PT, DPT   Acute Rehabilitation Department Pager #: [phone]   Sandra Cockayne 03/26/2022, 5:05 PM

## 2022-03-26 NOTE — Care Management Important Message (Signed)
Important Message  Patient Details  Name: John [name] MRN: [id number] Date of Birth: May 23, 1941   Medicare Important Message Given:  Yes     [name] 03/26/2022, 10:42 AM

## 2022-03-26 NOTE — TOC Initial Note (Addendum)
Transition of Care [facility]) - Initial/Assessment Note    Patient Details  Name: John [name] MRN: [id number] Date of Birth: [birth year]  Transition of Care [facility]) CM/SW Contact:    Zenon Mayo, RN Phone Number: 03/26/2022, 3:57 PM  Clinical Narrative:                 From home with daughter , he uses a rollator and cane at home and oxygen at home, ([employer]) 4 liters.  He has [facility] recently,  NCM offered choice, he would like to continue with [facility] for [place], [place].  He states also his daughter has been working on getting oupatient palliative services set up for him.  NCM  left message for daughter to return call.  She called this NCM back and sates he has palliative services meeting scheduled on 6/22 with [facility].  NCM left message for return call with [facility] to see if can get a sooner date for the meeting. Son states his sister will make the hospital follow up apt for his PCP.   Expected Discharge Plan: [place] Barriers to Discharge: Continued Medical Work up   Patient Goals and CMS Choice Patient states their goals for this hospitalization and ongoing recovery are:: return home with [facility] CMS Medicare.gov Compare Post Acute Care list provided to:: Patient Choice offered to / list presented to : Patient  Expected Discharge Plan and Services Expected Discharge Plan: [place]   Discharge Planning Services: CM Consult Post Acute Care Choice: [place] arrangements for the past 2 months: Single Family Home                   DME Agency: NA       HH Arranged: PT, OT HH Agency: [place] Date [facility] Agency Contacted: 03/26/22 Time HH Agency Contacted: 1555 Representative spoke with at [place]: Tommi Rumps  Prior Living Arrangements/Services Living arrangements for the past 2 months: [place] Lives with:: Adult Children Patient language and need for interpreter reviewed::  Yes Do you feel safe going back to the place where you live?: Yes      Need for Family Participation in Patient Care: Yes (Comment) Care giver support system in place?: Yes (comment) Current home services: DME (cane/rollator) Criminal Activity/Legal Involvement Pertinent to Current Situation/Hospitalization: Yes - Comment as needed  Activities of Daily Living Home Assistive Devices/Equipment: Cane (specify quad or straight), Wheelchair, Environmental consultant (specify type), Eyeglasses, Hearing aid, Oxygen ADL Screening (condition at time of admission) Patient's cognitive ability adequate to safely complete daily activities?: Yes Is the patient deaf or have difficulty hearing?: No Does the patient have difficulty seeing, even when wearing glasses/contacts?: No Does the patient have difficulty concentrating, remembering, or making decisions?: No Patient able to express need for assistance with ADLs?: Yes Does the patient have difficulty dressing or bathing?: No Independently performs ADLs?: Yes (appropriate for developmental age) Does the patient have difficulty walking or climbing stairs?: No Weakness of Legs: None Weakness of Arms/Hands: None  Permission Sought/Granted                  Emotional Assessment Appearance:: Appears stated age Attitude/Demeanor/Rapport: Engaged Affect (typically observed): Appropriate Orientation: : Oriented to Self, Oriented to Place, Oriented to  Time, Oriented to Situation Alcohol / Substance Use: Not Applicable Psych Involvement: No (comment)  Admission diagnosis:  SOB (shortness of breath) [R06.02] ACS (acute coronary syndrome) ([place]) [I24.9] Acute on  chronic diastolic (congestive) heart failure ([place]) [I50.33] Patient Active Problem List   Diagnosis Date Noted   Depression 03/24/2022   Thrombocytopenia ([place]) 03/23/2022   Anemia in CKD (chronic kidney disease) 03/13/2022   Anxiety 03/08/2022   Acute on chronic respiratory failure with hypoxia ([place])  03/06/2022   Pleural effusion, right    Acute urinary retention 01/30/2022   COPD exacerbation ([place]) 01/26/2022   CKD (chronic kidney disease) stage 4, GFR 15-29 ml/min (HCC) 01/26/2022   Paroxysmal atrial fibrillation ([place]) 01/26/2022   Diabetes mellitus, type 2 ([place]) 01/09/2022   D-dimer, elevated 12/24/2021    Class: Acute   NSTEMI (non-ST elevated myocardial infarction) ([place]) 12/24/2021   Acute on chronic combined systolic and diastolic CHF (congestive heart failure) ([place]) 09/13/2021   Type 2 diabetes mellitus with hyperlipidemia ([place]) 09/13/2021   Obstructive sleep apnea 09/13/2021   BPH (benign prostatic hyperplasia) 09/13/2021   Diabetic neuropathy ([place]) 09/13/2021   Chest pain 97/67/3419   Acute diastolic CHF (congestive heart failure) ([place]) 12/19/2020   Essential hypertension 10/03/2012   CAD (coronary artery disease) of artery bypass graft 06/26/2012   Cardiomyopathy, 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   Peripheral vascular disease ([place]) 06/26/2012   PCP:  [name], DO Pharmacy:   [facility] [address] [place] [address] Phone: [phone] Fax: [phone]  [id number] [place], [place] - [address] [place] [address] Phone: [phone] Fax: [phone]  [facility] [place], [place] [address] [place] [address] Phone: [phone] Fax: [phone]  [id number] [place], [place] [address] [place] [address] Phone: [phone] Fax: [phone]     Social Determinants of Health ([place]) Interventions    Readmission Risk Interventions    03/26/2022    3:15 PM 03/08/2022   11:55 AM 01/29/2022   10:39 AM  Readmission Risk Prevention Plan  Transportation Screening Complete Complete Complete  Medication Review ([place]) Complete Complete Complete  PCP  or Specialist appointment within 3-5 days of discharge Complete    HRI or [place] Complete Complete Complete  SW Recovery Care/Counseling Consult Complete Complete Complete  Palliative Care Screening Complete Not Applicable Not [place] Not Applicable Not Applicable Not Applicable

## 2022-03-26 NOTE — Interval H&P Note (Signed)
Cath Lab Visit (complete for each Cath Lab visit)  Clinical Evaluation Leading to the Procedure:   ACS: No.  Non-ACS:    Anginal Classification: No Symptoms  Anti-ischemic medical therapy: Minimal Therapy (1 class of medications)  Non-Invasive Test Results: No non-invasive testing performed  Prior CABG: No previous CABG      History and Physical Interval Note:  03/26/2022 9:20 AM  [place]  has presented today for surgery, with the diagnosis of HF.  The various methods of treatment have been discussed with the patient and family. After consideration of risks, benefits and other options for treatment, the patient has consented to  Procedure(s): RIGHT HEART CATH (N/A) as a surgical intervention.  The patient's history has been reviewed, patient examined, no change in status, stable for surgery.  I have reviewed the patient's chart and labs.  Questions were answered to the patient's satisfaction.     Quay Burow

## 2022-03-26 NOTE — Plan of Care (Signed)
  Problem: Activity: Goal: Ability to return to baseline activity level will improve Outcome: Progressing   

## 2022-03-26 NOTE — Consult Note (Signed)
   [facility] Inpatient Consult   03/26/2022  John [name] 04-Mar-1941 [id number]  [place] Organization [ACO] Patient: Medicare ACO REACH  Primary Care Provider:  [name], DO,  is an Independent embedded provider with a Chronic Care Management team and program, and is listed for the transition of care follow up and appointments. 3:45 pm Discussed in progression with inpatient [facility] team and patient is also working with [facility] for palliative care confirmed by daughter John [name] via phone call with Inpatient TOC RNCM.  Patient was screened for Embedded practice service needs for chronic care management and noted patient is active with [facility] NP-[facility] for care management coordination.  Plan: Continue to follow progress for any additional TOC needs for post hospital needs.  Please contact for further questions,  [name], RN BSN [facility] Liaison  [phone] business mobile phone Toll free office [phone]  Fax number: [phone] [email or web address]

## 2022-03-26 NOTE — Progress Notes (Signed)
Progress Note  Patient: John [name] SEG:[id number] DOB: 10-05-1941  DOA: 03/23/2022  DOS: 03/26/2022    Brief hospital course: Jacobs Golab is an 81 y.o. male with a history of chronic 4L O2 dependence, COPD, asbestosis, combined HFrEF, PAF, CAD, T2DM, HTN, HLD, PAD, history of CVA, and stage IV CKD who presented to the ED 6/2 with shortness of breath, weight gain, and hypoxia. He was found to be volume overloaded with pulmonary edema on CXR and was readmitted for decompensated CHF. IV lasix has been given with improvement. Right heart catheterization performed 6/5 showed elevated pressures suggesting need for continued diuresis.  Assessment and Plan: Acute on chronic HFrEF: LVEF 25-30% with anterolateral and apical hypokinesis.  - Continue lasix 80mg  IV BID. Held dose this AM due to Cr elevation but will reorder now with elevated RA, PCWP, and PA pressures and unchanged BNP. - GDMT otherwise limited by stage IV CKD. Continue metoprolol, hydralazine.  - Monitor I/O (pt unable to reliably urinate into urinal or hat, so inaccurate currently), daily weights (83.3 > 82.2kg) (previous putative EDW was 73XT/062IRS), and metabolic panel.   Demand myocardial ischemia in patient with CAD s/p CABG/PCI: Troponin elevated, though no anginal complaints.  - No acute work up or interventions planned. Continue antiplatelet, statin, BB.  PAF: Currently maintaining NSR - Continue metoprolol - Continue dose-reduced eliquis (age, renal impairment)  HTN:  - Continue metoprolol, hydralazine and diuresis.  - Restart imdur when cleared by cardiology. Wishing to avoid hypotension while diuresing with CKD.  Acute on chronic hypoxic respiratory failure: Due to acute cardiogenic pulmonary edema due to CHF decompensation, chronically related to COPD, asbestosis, OSA.  - Continue to wean supplemental oxygen to his home dose. - RHC as above.   COPD: No exacerbation currently.  - Continue bronchodilators.   OSA:  -  Continue CPAP qHS.   Stage IV CKD:    - Monitor BMP in AM. Avoid further nephrotoxic medications. Pt is not likely to be a dialysis candidate.  - Avoid NSAIDs, etc.  - Monitor renal parameters closely.   Hypokalemia: Improved  - Continue supplementation for level 3.7 with AFib and loop diuretic  T2DM: HbA1c was 7.1% in March 2023 indicating adequate-for-age glycemic control chronically.  - Uncontrolled with hypoglycemia while admitted. Home doses glargine 35u daily, 7u TIDWC novolog), decreased glargine insulin to 15u, mealtime novolog 2u TIDWC + very sensitive SSI.   Anemia of CKD and iron deficiency: Recent folic acid, W54 are replete. - Continue po iron.  - Given chronic hypoxia, may benefit from ESA. Would defer to nephrology.   Thrombocytopenia: Stable without bleeding.  - Monitor intermittently  PAD: No critical ischemia. Hx fem-pop bypass Aug 2019. - Continue plavix, statin, risk factor optimization.   Depression, anxiety: Quiescent.  - Continue home sertraline and alprazolam.   BPH: No retention currently noted.  - Continue tamsulosin  HLD:  - Continue statin.   GERD:  - Continue PPI  Subjective: Feels he's improved. Breathing more easily, noted mostly on exertion. No complications from procedure. No chest pain.   Objective: Vitals:   03/26/22 0934 03/26/22 0939 03/26/22 0944 03/26/22 1008  BP: (!) 152/80 138/75 (!) 143/95 138/76  Pulse: 72 69 69 71  Resp: 16 14 16  (!) 21  Temp:    97.6 F (36.4 C)  TempSrc:    Oral  SpO2: 97% 97% 96% 99%  Weight:      Height:       Gen: Elderly male in no distress Pulm:  Nonlabored breathing supplemental oxygen with crackles bilaterally in lower-mid zones. No wheezes. CV: Regular rate and rhythm. No murmur, rub, or gallop. No JVD completely upright, 1+ dependent edema. GI: Abdomen soft, non-tender, non-distended, with normoactive bowel sounds.  Ext: Warm, no deformities Skin: No rashes, lesions or ulcers on visualized  skin. Neuro: Alert and oriented. No focal neurological deficits. Psych: Judgement and insight appear fair. Mood euthymic & affect congruent. Behavior is appropriate.    Data Personally reviewed: CBC: Recent Labs  Lab 03/23/22 0737 03/24/22 0311 03/26/22 0315  WBC 12.1* 9.1 7.9  HGB 8.8* 8.4* 8.5*  HCT 29.8* 27.3* 26.9*  MCV 93.7 91.6 91.2  PLT 141* 127* 811*   Basic Metabolic Panel: Recent Labs  Lab 03/23/22 0737 03/24/22 0311 03/25/22 0402 03/26/22 0315  NA 139 138 134* 136  K 3.5 3.1* 4.0 3.7  CL 99 101 99 99  CO2 29 26 27 29   GLUCOSE 176* 74 189* 136*  BUN 56* 57* 54* 58*  CREATININE 2.23* 1.95* 1.91* 2.29*  CALCIUM 8.8* 8.7* 8.8* 9.1  MG  --  2.0  --  2.1   Liver Function Tests: Recent Labs  Lab 03/23/22 0737  AST 16  ALT 13  ALKPHOS 51  BILITOT 0.9  PROT 5.9*  ALBUMIN 3.1*   UA: Negative Flu/SARS-CoV-2 PCR: Negative  Family Communication: None at bedside  Disposition: Status is: Inpatient Remains inpatient appropriate because: Continuing IV diuresis Planned Discharge Destination: Home, PT/OT consulted    Patrecia Pour, MD 03/26/2022 2:14 PM Page by [email or web address]

## 2022-03-27 ENCOUNTER — Other Ambulatory Visit (HOSPITAL_COMMUNITY): Payer: Self-pay

## 2022-03-27 DIAGNOSIS — I10 Essential (primary) hypertension: Secondary | ICD-10-CM | POA: Diagnosis not present

## 2022-03-27 DIAGNOSIS — N179 Acute kidney failure, unspecified: Secondary | ICD-10-CM

## 2022-03-27 DIAGNOSIS — N184 Chronic kidney disease, stage 4 (severe): Secondary | ICD-10-CM | POA: Diagnosis not present

## 2022-03-27 DIAGNOSIS — I25708 Atherosclerosis of coronary artery bypass graft(s), unspecified, with other forms of angina pectoris: Secondary | ICD-10-CM | POA: Diagnosis not present

## 2022-03-27 DIAGNOSIS — N401 Enlarged prostate with lower urinary tract symptoms: Secondary | ICD-10-CM | POA: Diagnosis not present

## 2022-03-27 DIAGNOSIS — I5043 Acute on chronic combined systolic (congestive) and diastolic (congestive) heart failure: Secondary | ICD-10-CM | POA: Diagnosis not present

## 2022-03-27 DIAGNOSIS — I25700 Atherosclerosis of coronary artery bypass graft(s), unspecified, with unstable angina pectoris: Secondary | ICD-10-CM | POA: Diagnosis not present

## 2022-03-27 LAB — GLUCOSE, CAPILLARY
Glucose-Capillary: 182 mg/dL — ABNORMAL HIGH (ref 70–99)
Glucose-Capillary: 187 mg/dL — ABNORMAL HIGH (ref 70–99)
Glucose-Capillary: 208 mg/dL — ABNORMAL HIGH (ref 70–99)
Glucose-Capillary: 202 mg/dL — ABNORMAL HIGH (ref 70–99)

## 2022-03-27 LAB — BASIC METABOLIC PANEL
Anion gap: 10 (ref 5–15)
BUN: 62 mg/dL — ABNORMAL HIGH (ref 8–23)
CO2: 27 mmol/L (ref 22–32)
Calcium: 9 mg/dL (ref 8.9–10.3)
Chloride: 100 mmol/L (ref 98–111)
Creatinine, Ser: 2.26 mg/dL — ABNORMAL HIGH (ref 0.61–1.24)
GFR, Estimated: 28 mL/min — ABNORMAL LOW (ref >60)
Glucose, Bld: 187 mg/dL — ABNORMAL HIGH (ref 70–99)
Potassium: 4 mmol/L (ref 3.5–5.1)
Sodium: 137 mmol/L (ref 135–145)

## 2022-03-27 MED ORDER — FUROSEMIDE 10 MG/ML IJ SOLN
80.0000 mg | Freq: Three times a day (TID) | INTRAMUSCULAR | Status: DC
  Administered 2022-03-27 – 2022-03-29 (×6): 80 mg via INTRAVENOUS
  Filled 2022-03-27 (×7): qty 8

## 2022-03-27 MED ORDER — ISOSORBIDE DINITRATE 10 MG PO TABS: 20.0000 mg | ORAL_TABLET | Freq: Three times a day (TID) | ORAL | Status: DC

## 2022-03-27 MED ORDER — ISOSORBIDE DINITRATE 10 MG PO TABS
20.0000 mg | ORAL_TABLET | Freq: Two times a day (BID) | ORAL | Status: DC
  Administered 2022-03-27 – 2022-03-31 (×9): 20 mg via ORAL
  Filled 2022-03-27 (×9): qty 2

## 2022-03-27 NOTE — Progress Notes (Signed)
Mobility Specialist Progress Note:   03/27/22 1645  Mobility  Activity Ambulated with assistance to bathroom  Level of Assistance Standby assist, set-up cues, supervision of patient - no hands on  Assistive Device Front wheel walker  Distance Ambulated (ft) 20 ft  Activity Response Tolerated well  $Mobility charge 1 Mobility   Pt received in bathroom needing to get back to bed. No complaints of pain. Left EOB with call bell in reach and all needs met.   [facility] Cassandra Harbold Mobility Specialist

## 2022-03-27 NOTE — Progress Notes (Signed)
Progress Note  Patient: John [name] NKN:[id number] DOB: [birth year]  DOA: 03/23/2022  DOS: 03/27/2022    Brief hospital course: John [name] is an 81 y.o. male with a history of chronic 4L O2 dependence, COPD, asbestosis, combined HFrEF, PAF, CAD, T2DM, HTN, HLD, PAD, history of CVA, and stage IV CKD who presented to the ED 6/2 with shortness of breath, weight gain, and hypoxia. He was found to be volume overloaded with pulmonary edema on CXR and was readmitted for decompensated CHF. IV lasix has been given with improvement. Right heart catheterization performed 6/5 showed elevated pressures suggesting need for continued diuresis.  Assessment and Plan: Acute on chronic HFrEF: LVEF 25-30% with anterolateral and apical hypokinesis.  - Continue lasix 80mg  IV, augmenting BID > TID today per cardiology. Elevated RA, PCWP, and PA pressures and unchanged BNP.Still with crackles.  - GDMT otherwise limited by stage IV CKD. Continue metoprolol, hydralazine.  - Monitor I/O (pt unable to reliably urinate into urinal or hat, so has been underestimating diuresis), daily weights (83.3 >> 81kg) (previous putative EDW was 37TK/240XBD), and metabolic panel.   Demand myocardial ischemia in patient with CAD s/p CABG/PCI: Troponin elevated, though no anginal complaints.  - No acute work up or interventions planned. Continue antiplatelet, statin, BB, nitrate.  PAF: Currently maintaining NSR - Continue metoprolol - Continue dose-reduced eliquis (age, renal impairment)  HTN:  - Continue metoprolol, hydralazine and diuresis.  - Restart imdur 6/6  Acute on chronic hypoxic respiratory failure: Due to acute cardiogenic pulmonary edema due to CHF decompensation, chronically related to COPD, asbestosis, OSA.  - Continue to wean supplemental oxygen to his home dose. - RHC as above.   COPD: No exacerbation currently.  - Continue bronchodilators.   OSA:  - Continue CPAP qHS.   Stage IV CKD:    - Monitor BMP in AM.  Avoid further nephrotoxic medications. Pt is not likely to be a dialysis candidate.  - Avoid NSAIDs, etc.  - Monitor renal parameters closely.   Hypokalemia: Improved  - Continue supplementation for level 3.7 with AFib and loop diuretic  T2DM: HbA1c was 7.1% in March 2023 indicating adequate-for-age glycemic control chronically.  - Uncontrolled with hypoglycemia while admitted. Home doses glargine 35u daily, 7u TIDWC novolog), decreased glargine insulin to 15u with reasonable control given the risk of adverse event with recurrent hypoglycemia, continue this and mealtime novolog 2u TIDWC + very sensitive SSI.   Anemia of CKD and iron deficiency: Recent folic acid, Z32 are replete. - Continue po iron.  - Given chronic hypoxia, may benefit from ESA. Would defer to nephrology.   Thrombocytopenia: Stable without bleeding.  - Monitor intermittently  PAD: No critical ischemia. Hx fem-pop bypass Aug 2019. - Continue plavix, statin, risk factor optimization.   Depression, anxiety: Quiescent.  - Continue home sertraline and alprazolam.   BPH: No retention currently noted.  - Continue tamsulosin  HLD:  - Continue statin.   GERD:  - Continue PPI  Subjective: Still very winded when moving, e.g. getting up to bathroom, worse than his baseline. He misses the collection container often, so output is not recorded well. No chest pain. Doesn't remember what the cath showed.   Objective: Vitals:   03/27/22 0726 03/27/22 0800 03/27/22 0904 03/27/22 1242  BP:  (!) 118/59 (!) 121/50 (!) 110/58  Pulse: 79 68 76 73  Resp: 18 18    Temp:    98.3 F (36.8 C)  TempSrc:    Oral  SpO2: 98% 99%  98%  Weight:      Height:      Gen: Pleasant, elderly male in no distress Pulm: Nonlabored breathing supplemental oxygen. Crackles to midlung zones bilaterally. CV: Regular rate and rhythm. No murmur, rub, or gallop. No JVD, +pitting dependent edema. GI: Abdomen soft, non-tender, non-distended, with  normoactive bowel sounds.  Ext: Warm, no deformities Skin: No rashes, lesions or ulcers on visualized skin. Neuro: Alert and oriented. No focal neurological deficits. Psych: Judgement and insight appear fair. Mood euthymic & affect congruent. Behavior is appropriate.    Data Personally reviewed: CBC: Recent Labs  Lab 03/23/22 0737 03/24/22 0311 03/26/22 0315 03/26/22 0943  WBC 12.1* 9.1 7.9  --   HGB 8.8* 8.4* 8.5* 9.2*  9.2*  HCT 29.8* 27.3* 26.9* 27.0*  27.0*  MCV 93.7 91.6 91.2  --   PLT 141* 127* 139*  --    Basic Metabolic Panel: Recent Labs  Lab 03/23/22 0737 03/24/22 0311 03/25/22 0402 03/26/22 0315 03/26/22 0943 03/27/22 0425  NA 139 138 134* 136 138  139 137  K 3.5 3.1* 4.0 3.7 3.9  3.8 4.0  CL 99 101 99 99  --  100  CO2 29 26 27 29   --  27  GLUCOSE 176* 74 189* 136*  --  187*  BUN 56* 57* 54* 58*  --  62*  CREATININE 2.23* 1.95* 1.91* 2.29*  --  2.26*  CALCIUM 8.8* 8.7* 8.8* 9.1  --  9.0  MG  --  2.0  --  2.1  --   --    Liver Function Tests: Recent Labs  Lab 03/23/22 0737  AST 16  ALT 13  ALKPHOS 51  BILITOT 0.9  PROT 5.9*  ALBUMIN 3.1*   UA: Negative Flu/SARS-CoV-2 PCR: Negative  Family Communication: Grandson and great-granddaughter at bedside this AM  Disposition: Status is: Inpatient Remains inpatient appropriate because: Continuing IV diuresis Planned Discharge Destination: Home, PT/OT consulted    Patrecia Pour, MD 03/27/2022 2:39 PM Page by [email or web address]

## 2022-03-27 NOTE — Plan of Care (Signed)
  Problem: Education: Goal: Ability to demonstrate management of disease process will improve Outcome: Progressing Goal: Ability to verbalize understanding of medication therapies will improve Outcome: Progressing Goal: Individualized Educational Video(s) Outcome: Progressing   Problem: Cardiac: Goal: Ability to achieve and maintain adequate cardiopulmonary perfusion will improve Outcome: Progressing   

## 2022-03-27 NOTE — Progress Notes (Signed)
Heart Failure Stewardship Pharmacist Progress Note   PCP: [name], DO PCP-Cardiologist: Kirk Ruths, MD    HPI:  81 yo M with PMH of CHF, afib, CAD, HTN, HLD, PAD, CVA, chronic respiratory failure on 4L, T2DM, COPD, and CKD IV.   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 However, in June 2013, EF was 40% with ischemia on LHC - PCI x 2 to LAD. He had an ECHO done in 12/2020 and LVEF was 50-55%. It has since gradually reduced.  He presented to the ED on 6/2 with shortness of breath, orthopnea, and chest tightness. Also endorses abdominal edema. CXR with cardiomegaly and pulmonary edema. His last ECHO was on 5/18 and LVEF was 25-30% with mild LVH. RHC on 6/5 with elevated filling pressures - RA 9, PA 41, wedge 23, CO 5.3, CI 2.7. IV diuresis continued.  Current HF Medications: Diuretic: furosemide 80 mg IV TID Beta Blocker: metoprolol XL 50 mg daily Other: hydralazine 25 mg BID; Isordil 20 mg BID  Prior to admission HF Medications: Diuretic: torsemide 40 mg BID Beta blocker: metoprolol XL 50 mg daily Other: hydralazine 25 mg BID + Isordil 20 mg BID  Pertinent Lab Values: Serum creatinine 2.26, BUN 62, Potassium 4.0, Sodium 137, BNP 795, Magnesium 2.1, A1c 7.1 (12/24/21)   Vital Signs: Weight: 179 lbs (admission weight: 182 lbs) Blood pressure: 120/50s  Heart rate: 70s  I/O: -1.1L yesterday; net -1.5L  Medication Assistance / Insurance Benefits Check: Does the patient have prescription insurance?  Yes Type of insurance plan: [employer]  Outpatient Pharmacy:  Prior to admission outpatient pharmacy: [facility] Is the patient willing to use [facility] at discharge? Yes Is the patient willing to transition their outpatient pharmacy to utilize a [facility] outpatient pharmacy?   Pending    Assessment: 1. 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 NYHA class III symptoms. - Was on furosemide 80 mg IV BID - increased to TID this afternoon with  suboptimal response - Continue metoprolol XL 50 mg daily - No ACE/ARB/ARNI, spironolactone, or SGLT2i with AKI on CKD - Continue hydralazine 25 mg BID + Isordil 20 mg BID   Plan: 1) Medication changes recommended at this time: - Agree with increasing IV lasix  2) Patient assistance: Delene Loll copay $195 - Farxiga copay $40  3)  Education  - To be completed prior to discharge  Kerby Nora, PharmD, BCPS Heart Failure Stewardship Pharmacist Phone [phone]

## 2022-03-27 NOTE — Progress Notes (Addendum)
Mobility Specialist Progress Note:   03/27/22 1101  Mobility  Activity Ambulated with assistance in hallway  Level of Assistance Standby assist, set-up cues, supervision of patient - no hands on  Assistive Device Four wheel walker  Distance Ambulated (ft) 240 ft  Activity Response Tolerated well  $Mobility charge 1 Mobility   Pt received in bed willing to participate in mobility. No complaints of pain. Pt required 1 short seated break. Left EOB with call bell in reach and all needs met.   [facility] Archita Lomeli Mobility Specialist

## 2022-03-27 NOTE — Progress Notes (Addendum)
Progress Note  Patient Name: John [name] Date of Encounter: 03/27/2022  [facility] Cardiologist: Kirk Ruths, MD   Subjective   Denies any chest pain and breathing improved.  Right heart cath yesterday showed  RAP 14/11, RVP 58/7, PAP 59/23 with mean of 41 and pulmonary capillary wedge mean 23 mmHg.  Inpatient Medications    Scheduled Meds:  allopurinol  300 mg Oral Daily   apixaban  2.5 mg Oral BID   clopidogrel  75 mg Oral Daily   ferrous sulfate  325 mg Oral BID WC   furosemide  80 mg Intravenous BID   gabapentin  300 mg Oral QHS   hydrALAZINE  25 mg Oral BID   insulin aspart  0-6 Units Subcutaneous TID WC   insulin aspart  3 Units Subcutaneous TID WC   insulin detemir  15 Units Subcutaneous Daily   metoprolol succinate  50 mg Oral Daily   mometasone-formoterol  2 puff Inhalation BID   nitroGLYCERIN  1 inch Topical Q6H   pantoprazole  40 mg Oral Q breakfast   rosuvastatin  10 mg Oral QPM   sertraline  25 mg Oral QPM   sodium chloride flush  3 mL Intravenous Q12H   sodium chloride flush  3 mL Intravenous Q12H   sodium chloride flush  3 mL Intravenous Q12H   tamsulosin  0.4 mg Oral Daily   thiamine  100 mg Oral Daily   Continuous Infusions:  sodium chloride     PRN Meds: sodium chloride, acetaminophen **OR** acetaminophen, albuterol, ALPRAZolam, calcium carbonate, morphine injection, ondansetron (ZOFRAN) IV, sodium chloride flush   Vital Signs    Vitals:   03/26/22 2332 03/27/22 0415 03/27/22 0726 03/27/22 0800  BP:  125/61  (!) 118/59  Pulse: 77 64 79 68  Resp: 18 18 18 18   Temp:  (!) 97.4 F (36.3 C)    TempSrc:  Oral    SpO2: 94% 100% 98% 99%  Weight:  81.6 kg    Height:        Intake/Output Summary (Last 24 hours) at 03/27/2022 0805 Last data filed at 03/26/2022 2200 Gross per 24 hour  Intake 720 ml  Output 1075 ml  Net -355 ml       03/27/2022    4:15 AM 03/26/2022    5:40 AM 03/25/2022    5:12 AM  Last 3 Weights  Weight (lbs) 179 lb 14.4  oz 181 lb 3.2 oz 183 lb 9.6 oz  Weight (kg) 81.602 kg 82.192 kg 83.28 kg      Telemetry    Normal sinus rhythm with occasional PVCs- Personally Reviewed  ECG    No new EKG to review- Personally Reviewed  Physical Exam  GEN: Well nourished, well developed in no acute distress HEENT: Normal NECK: No JVD; No carotid bruits LYMPHATICS: No lymphadenopathy CARDIAC:RRR, no murmurs, rubs, gallops RESPIRATORY:  crackles at bases bilaterally ABDOMEN: Soft, non-tender, non-distended MUSCULOSKELETAL:  No edema; No deformity  SKIN: Warm and dry NEUROLOGIC:  Alert and oriented x 3 PSYCHIATRIC:  Normal affect   Labs    High Sensitivity Troponin:   Recent Labs  Lab 03/23/22 0737 03/23/22 0920  TROPONINIHS 112* 168*      Chemistry Recent Labs  Lab 03/23/22 0737 03/24/22 0311 03/25/22 0402 03/26/22 0315 03/26/22 0943 03/27/22 0425  NA 139 138 134* 136 138  139 137  K 3.5 3.1* 4.0 3.7 3.9  3.8 4.0  CL 99 101 99 99  --  100  CO2  29 26 27 29   --  27  GLUCOSE 176* 74 189* 136*  --  187*  BUN 56* 57* 54* 58*  --  62*  CREATININE 2.23* 1.95* 1.91* 2.29*  --  2.26*  CALCIUM 8.8* 8.7* 8.8* 9.1  --  9.0  MG  --  2.0  --  2.1  --   --   PROT 5.9*  --   --   --   --   --   ALBUMIN 3.1*  --   --   --   --   --   AST 16  --   --   --   --   --   ALT 13  --   --   --   --   --   ALKPHOS 51  --   --   --   --   --   BILITOT 0.9  --   --   --   --   --   GFRNONAA 29* 34* 34* 28*  --  28*  ANIONGAP 11 11 8 8   --  10     Lipids No results for input(s): CHOL, TRIG, HDL, LABVLDL, LDLCALC, CHOLHDL in the last 168 hours.  Hematology Recent Labs  Lab 03/23/22 0737 03/24/22 0311 03/26/22 0315 03/26/22 0943  WBC 12.1* 9.1 7.9  --   RBC 3.18* 2.98* 2.95*  --   HGB 8.8* 8.4* 8.5* 9.2*  9.2*  HCT 29.8* 27.3* 26.9* 27.0*  27.0*  MCV 93.7 91.6 91.2  --   MCH 27.7 28.2 28.8  --   MCHC 29.5* 30.8 31.6  --   RDW 17.9* 18.2* 18.7*  --   PLT 141* 127* 139*  --     Thyroid No results  for input(s): TSH, FREET4 in the last 168 hours.  BNP Recent Labs  Lab 03/23/22 0737 03/26/22 0315  BNP 769.5* 795.0*     DDimer No results for input(s): DDIMER in the last 168 hours.   Radiology    CARDIAC CATHETERIZATION  Result Date: 03/26/2022 Images from the original result were not included. John [name] is a 81 y.o. male  [id number] LOCATION:  FACILITY: [facility] PHYSICIAN: Quay Burow, M.D. 10/01/1941 DATE OF PROCEDURE:  03/26/2022 DATE OF DISCHARGE: Right heart cardiac CATHETERIZATION History obtained from chart review.81 y.o. male with a hx of combined systolic and diastolic heart failure, PAF, CAD, HTN, HLD, PAD, history of CVA, chronic respiratory failure with hypoxia on 4L, type 2 DM, asbestosis, COPD, and ckd stage IV who has a recurrent admission for decompensated heart failure the patient was diuresed.  He was evaluated by Dr. Sallyanne Kuster who requested right heart cath to determine hemodynamics and degree of diuresis. PROCEDURE DESCRIPTION: The patient was brought to the second floor [facility]iac cath lab in the postabsorptive state. He was premedicated with IV Versed and fentanyl. His right antecubital fossa was prepped and shaved in usual sterile fashion. Xylocaine 1% was used for local anesthesia. A 5 French sheath was inserted into the right antecubitalVein .  A 5 French sheath was exchanged for the existing Angiocath already in place.  A 5 French balloontipped Swan-Ganz catheter was then advanced through the right heart chambers obtaining sequential pressures and pulmonary artery blood samples for the determination of Fick cardiac output. HEMODYNAMICS: Right atrial pressure-14/11 Right ventricular pressure-58/7 Pulmonary artery pressure-59/23, mean 41 Pulmonary capillary wedge pressure-A-wave 28, V wave 32, mean 23 Cardiac output (Fick) 5.3 L/min with an index of 2.7 L/min/m.  John [name] right atrial pressure, pulmonary capillary wedge pressure (V wave) and pulmonary artery  pressure are all elevated suggesting that he is inadequately diuresed.  Recommend continued diuresis.  The Swan-Ganz catheter and sheath were removed and a pressure dressing applied.  The patient left lab in stable condition. Quay Burow. MD, [facility] 03/26/2022 9:58 AM     Cardiac Studies   Echocardiogram (limited) 03/08/22 1. Limited study.   2. Poor acoustic windows.   3. LVEF is severely depressed with hypokinesis worse in the lateral,  anterior and apical walls . Left ventricular ejection fraction, by  estimation, is 25 to 30%. The left ventricle has severely decreased  function. The left ventricular internal cavity  size was moderately dilated. There is mild left ventricular hypertrophy.   4. Right ventricular systolic function is normal. The right ventricular  size is normal.   5. The inferior vena cava is dilated in size with <50% respiratory  variability, suggesting right atrial pressure of 15 mmHg.   Patient Profile     81 y.o. male with a hx of combined systolic and diastolic heart failure, PAF, CAD, HTN, HLD, PAD, history of CVA, chronic respiratory failure with hypoxia on 4L, type 2 DM, asbestosis, COPD, and ckd stage IV who has a recurrent admission for decompensated heart failure  Assessment & Plan    Acute on Chronic HFrEF  -Achieving "dry weight" has been challenging due to concomitant problems with advanced chronic kidney disease. Suspect readmission is related to insufficient diuresis.   -Baseline BNP probably 500.  -Right heart cath yesterday showed  RAP 14/11, RVP 58/7, PAP 59/23 with mean of 41 and pulmonary capillary wedge mean 23 mmHg all consistent with ongoing volume overload -still has crackles at bases -He put out 1 L yesterday and is net -1.1 L -Serum creatinine improved from 2.29->>2.26 today and potassium is 4 today -Increase Lasix to 80 mg IV 3 times daily to promote more diuresis -continue Hydralazine 25mg  BID, Isosorbide dinitrate 20mg  BID and Toprol XL  50mg  daily -no ACEI/ARB/ARNi/MRA due to CKD -Follow daily weights, I's and O's and renal function closely while diuresing  2. CAD/Ischemic cardiomyopathy  - Most recent echo (limited) on 03/08/22 showed EF 25-30%, severely decreased LV function.  NSTEMI earlier this year. - Patient has a history of CAD, DESx2 to LAD in 2013 (EF 40%). NSTEMI in 12/2021 that was managed medically due to AKI (EF 35%)  -Currently without angina -Current minor elevation in troponin is consistent with subendocardial ischemia due to heart failure exacerbation, rather than a new acute coronary event.   - It is likely that he now has multivessel CAD.  He is not a good candidate for surgical revascularization.  PCI is not likely to lead to significant improvement in left ventricular function. All told, I believe the benefit of left heart catheterization would be limited and the risk of complications (specifically renal failure) would be high. In my opinion, left heart catheterization and coronary angiography -He has not had any angina -Continue Plavix 75 mg daily, Toprol-XL 50 mg daily and isosorbide dinitrate 20 mg twice daily along with statin therapy -No aspirin due to Eliquis   3. Paroxysmal Atrial Fibrillation  -He remains in normal sinus rhythm on telemetry -Continue Eliquis 2.5 mg twice daily (dose reduced due to age, renal function) and Toprol-XL 50 mg daily   4. CKD stage 4  - Monitor daily.  Serum creatinine slowly improving with diuresis -Very high risk for contrast nephrotoxicity and he would  be a poor candidate for chronic hemodialysis.   5.  Chronic respiratory sufficiency with hypoxia:  -related to COPD, asbestosis, longstanding obstructive sleep apnea and volume overload.   -Right heart catheterization consistent with ongoing volume overload requiring diuresis   6. Anemia:  -Surprising degree of anemia considering chronic hypoxemia.   -Normocytic indices with very broad RDW.  - Labs confirm iron  deficiency, but has normal B12 and folate levels.   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  Also has mild thrombocytopenia during this admission.    I have spent a total of 35 minutes with patient reviewing cardiac cath , telemetry, EKGs, labs and examining patient as well as establishing an assessment and plan that was discussed with the patient.  > 50% of time was spent in direct patient care.       For questions or updates, please contact [facility] Please consult [email or web address] for contact info under        Signed, Fransico Him, MD  03/27/2022, 8:05 AM

## 2022-03-27 NOTE — Progress Notes (Signed)
Pt placed on BIPAP for night rest with oxygen bled in.  Resting well at this time.

## 2022-03-28 ENCOUNTER — Encounter (HOSPITAL_COMMUNITY): Payer: Self-pay | Admitting: Internal Medicine

## 2022-03-28 DIAGNOSIS — I5043 Acute on chronic combined systolic (congestive) and diastolic (congestive) heart failure: Secondary | ICD-10-CM | POA: Diagnosis not present

## 2022-03-28 DIAGNOSIS — I25708 Atherosclerosis of coronary artery bypass graft(s), unspecified, with other forms of angina pectoris: Secondary | ICD-10-CM | POA: Diagnosis not present

## 2022-03-28 DIAGNOSIS — N401 Enlarged prostate with lower urinary tract symptoms: Secondary | ICD-10-CM | POA: Diagnosis not present

## 2022-03-28 DIAGNOSIS — N184 Chronic kidney disease, stage 4 (severe): Secondary | ICD-10-CM | POA: Diagnosis not present

## 2022-03-28 LAB — GLUCOSE, CAPILLARY
Glucose-Capillary: 149 mg/dL — ABNORMAL HIGH (ref 70–99)
Glucose-Capillary: 149 mg/dL — ABNORMAL HIGH (ref 70–99)
Glucose-Capillary: 252 mg/dL — ABNORMAL HIGH (ref 70–99)
Glucose-Capillary: 166 mg/dL — ABNORMAL HIGH (ref 70–99)
Glucose-Capillary: 178 mg/dL — ABNORMAL HIGH (ref 70–99)

## 2022-03-28 LAB — BASIC METABOLIC PANEL
Anion gap: 12 (ref 5–15)
BUN: 62 mg/dL — ABNORMAL HIGH (ref 8–23)
CO2: 31 mmol/L (ref 22–32)
Calcium: 9 mg/dL (ref 8.9–10.3)
Chloride: 95 mmol/L — ABNORMAL LOW (ref 98–111)
Creatinine, Ser: 2.32 mg/dL — ABNORMAL HIGH (ref 0.61–1.24)
GFR, Estimated: 27 mL/min — ABNORMAL LOW (ref >60)
Glucose, Bld: 145 mg/dL — ABNORMAL HIGH (ref 70–99)
Potassium: 3.3 mmol/L — ABNORMAL LOW (ref 3.5–5.1)
Sodium: 138 mmol/L (ref 135–145)

## 2022-03-28 LAB — LIPOPROTEIN A (LPA): Lipoprotein (a): <8.4 nmol/L (ref <75.0)

## 2022-03-28 MED ORDER — FERUMOXYTOL INJECTION 510 MG/17 ML
510.0000 mg | Freq: Once | INTRAVENOUS | Status: AC
  Administered 2022-03-28: 510 mg via INTRAVENOUS
  Filled 2022-03-28: qty 17

## 2022-03-28 MED ORDER — POTASSIUM CHLORIDE CRYS ER 20 MEQ PO TBCR
40.0000 meq | EXTENDED_RELEASE_TABLET | Freq: Two times a day (BID) | ORAL | Status: AC
Start: 2022-03-28 — End: 2022-03-28
  Administered 2022-03-28 (×2): 40 meq via ORAL
  Filled 2022-03-28 (×2): qty 2

## 2022-03-28 MED ORDER — METOLAZONE 5 MG PO TABS
5.0000 mg | ORAL_TABLET | Freq: Once | ORAL | Status: AC
Start: 2022-03-28 — End: 2022-03-28
  Administered 2022-03-28: 5 mg via ORAL
  Filled 2022-03-28: qty 1

## 2022-03-28 NOTE — Plan of Care (Signed)
  Problem: Cardiac: Goal: Ability to achieve and maintain adequate cardiopulmonary perfusion will improve Outcome: Progressing   Problem: Fluid Volume: Goal: Ability to maintain a balanced intake and output will improve Outcome: Progressing   Problem: Activity: Goal: Ability to return to baseline activity level will improve Outcome: Progressing

## 2022-03-28 NOTE — Progress Notes (Addendum)
Heart Failure Stewardship Pharmacist Progress Note   PCP: [name], DO PCP-Cardiologist: Kirk Ruths, MD    HPI:  81 yo M with PMH of CHF, afib, CAD, HTN, HLD, PAD, CVA, chronic respiratory failure on 4L, T2DM, COPD, and CKD IV.   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 However, in June 2013, EF was 40% with ischemia on LHC - PCI x 2 to LAD. He had an ECHO done in 12/2020 and LVEF was 50-55%. It has since gradually reduced.  He presented to the ED on 6/2 with shortness of breath, orthopnea, and chest tightness. Also endorses abdominal edema. CXR with cardiomegaly and pulmonary edema. His last ECHO was on 5/18 and LVEF was 25-30% with mild LVH. RHC on 6/5 with elevated filling pressures - RA 9, PA 41, wedge 23, CO 5.3, CI 2.7. IV diuresis continued.  Current HF Medications: Diuretic: furosemide 80 mg IV TID Beta Blocker: metoprolol XL 50 mg daily Other: hydralazine 25 mg BID; Isordil 20 mg BID  Prior to admission HF Medications: Diuretic: torsemide 40 mg BID Beta blocker: metoprolol XL 50 mg daily Other: hydralazine 25 mg BID + Isordil 20 mg BID  Pertinent Lab Values: Serum creatinine 2.32, BUN 62, Potassium 3.3, Sodium 138, BNP 795, Magnesium 2.1, A1c 7.1 (12/24/21)   Vital Signs: Weight: 179 lbs (admission weight: 182 lbs) Blood pressure: 110/60s  Heart rate: 50-70s  I/O: -665mL yesterday; net -1.8L  Medication Assistance / Insurance Benefits Check: Does the patient have prescription insurance?  Yes Type of insurance plan: [place] [employer]  Outpatient Pharmacy:  Prior to admission outpatient pharmacy: [facility] Is the patient willing to use [facility] at discharge? Yes Is the patient willing to transition their outpatient pharmacy to utilize a [facility] outpatient pharmacy?   Pending    Assessment: 1. Acute on chronic systolic CHF (LVEF 69-67%), due to ICM. NYHA class III symptoms. - IV lasix increased yesterday to 80 mg TID - output suboptimal (although  noted that he has a difficult time going in urinal so this may be underestimated) and weight unchanged - can consider adding a dose of metolazone today to augment diuresis. K low today - 40 mEq x 2 - Continue metoprolol XL 50 mg daily - No ACE/ARB/ARNI, spironolactone, or SGLT2i with AKI on CKD - Continue hydralazine 25 mg BID + Isordil 20 mg BID   Plan: 1) Medication changes recommended at this time: - Metolazone 2.5 mg x 1  2) Patient assistance: Delene Loll copay $195 - Farxiga copay $40  3)  Education  - To be completed prior to discharge  Kerby Nora, PharmD, BCPS Heart Failure Stewardship Pharmacist Phone [phone]

## 2022-03-28 NOTE — Progress Notes (Signed)
Physical Therapy Treatment Patient Details Name: John [name] MRN: [id number] DOB: August 01, 1941 Today's Date: 03/28/2022   History of Present Illness The pt is an 81 yo male presenting 6/2 with SOB. Pt with mild troponin elevation, found to have acute on chronic CHF exacerbation. S/p RHC 6/5. PMH includes: NSTEMI (12/24/21), CHF, COPD, OSA on CPAP, HTN, HLD, PVD s/p bypass, CAD s/p multiple stents, CAS s/p carotid stents, DMII, CKD IV, BPH, prior tobacco abuse, and obesity. Of note, pt with 3 admissions for CHF exacerbation since 01/09/22.    PT Comments    The pt was agreeable to session, eager to continue progressing mobility. He was able to complete more, longer bouts of hallway ambulation prior to needing seated rest, but did require 4L O2 to maintain SpO2> 90% while ambulating. The pt was able to self-monitor for rest breaks and elected to take x3 seated rest breaks during ambulation. After sitting, Spo2 dropped briefly to low of 87% each time, but recovered to 94% after 3-4 breaths during seated rest. Will continue to benefit from skilled PT to progress dynamic stability, endurance, and independence with mobility to allow for safest return home.   Gait Speed: 0.25m/s using rollator and with 4L O2. (Gait speed <0.7m/s indicates increased risk of falls and dependence in ADLs)    Recommendations for follow up therapy are one component of a multi-disciplinary discharge planning process, led by the attending physician.  Recommendations may be updated based on patient status, additional functional criteria and insurance authorization.  Follow Up Recommendations  Home health PT     Assistance Recommended at Discharge PRN  Patient can return home with the following A little help with walking and/or transfers;Help with stairs or ramp for entrance;Assist for transportation;A little help with bathing/dressing/bathroom;Assistance with cooking/housework   Equipment Recommendations  None recommended by PT     Recommendations for Other Services       Precautions / Restrictions Precautions Precautions: Fall Precaution Comments: watch O2, on 3-4L this session Restrictions Weight Bearing Restrictions: No     Mobility  Bed Mobility Overal bed mobility: Independent             General bed mobility comments: sitting EOB upon arrival, reports no issues    Transfers Overall transfer level: Modified independent Equipment used: Rollator (4 wheels)               General transfer comment: mildly increased time, no assist given.    Ambulation/Gait Ambulation/Gait assistance: Supervision Gait Distance (Feet): 100 Feet (+ 75 ft + 75 ft + 100 ft) Assistive device: Rollator (4 wheels) Gait Pattern/deviations: Step-through pattern, Decreased stride length Gait velocity: 0.5 m/s Gait velocity interpretation: 1.31 - 2.62 ft/sec, indicative of limited community ambulator   General Gait Details: pt with slow but generally steady steps, mildly increased forward lean. able to identify need for seated rest. SpO2 to low of 87% on 4L after ambulation, recovered to 94% after 3-4 seated breaths   Balance Overall balance assessment: Needs assistance Sitting-balance support: Feet supported, No upper extremity supported Sitting balance-Leahy Scale: Good     Standing balance support: Bilateral upper extremity supported, During functional activity Standing balance-Leahy Scale: Fair Standing balance comment: static stand without UE support, BUE support for gait                            Cognition Arousal/Alertness: Awake/alert Behavior During Therapy: WFL for tasks assessed/performed Overall Cognitive Status: Within Functional Limits for  tasks assessed                                 General Comments: pt able to answer all questions appropriately and demos good safety awarness        Exercises      General Comments General comments (skin integrity, edema,  etc.): SpO2 stable on 3L at rest, needing 4L to maintain in 90s with gait. when pt feels fatigued and asks for rest, SpO2 dropping to low of 87% momentarily, recovers with 3-4 seated breaths on 4L      Pertinent Vitals/Pain Pain Assessment Pain Assessment: No/denies pain Pain Intervention(s): Monitored during session     PT Goals (current goals can now be found in the care plan section) Acute Rehab PT Goals Patient Stated Goal: return home PT Goal Formulation: With patient Time For Goal Achievement: 04/09/22 Potential to Achieve Goals: Good Progress towards PT goals: Progressing toward goals    Frequency    Min 3X/week      PT Plan Current plan remains appropriate       AM-PAC PT "6 Clicks" Mobility   Outcome Measure  Help needed turning from your back to your side while in a flat bed without using bedrails?: None Help needed moving from lying on your back to sitting on the side of a flat bed without using bedrails?: None Help needed moving to and from a bed to a chair (including a wheelchair)?: None Help needed standing up from a chair using your arms (e.g., wheelchair or bedside chair)?: None Help needed to walk in hospital room?: A Little Help needed climbing 3-5 steps with a railing? : A Little 6 Click Score: 22    End of Session Equipment Utilized During Treatment: Gait belt;Oxygen Activity Tolerance: Patient tolerated treatment well;Patient limited by fatigue;No increased pain Patient left: in bed;with call bell/phone within reach (sitting EOB) Nurse Communication: Mobility status PT Visit Diagnosis: Unsteadiness on feet (R26.81);Other abnormalities of gait and mobility (R26.89);Muscle weakness (generalized) (M62.81)     Time: 7711-6579 PT Time Calculation (min) (ACUTE ONLY): 24 min  Charges:  $Therapeutic Exercise: 23-37 mins                     West Carbo, PT, DPT   Acute Rehabilitation Department Pager #: [phone]   Sandra Cockayne 03/28/2022,  11:13 AM

## 2022-03-28 NOTE — Progress Notes (Signed)
Heart Failure Nurse Navigator Progress Note  PCP: [name], DO PCP-Cardiologist: [place] Admission Diagnosis: Acute coronary syndrome, shortness of breath.  Admitted from: Home  Presentation:   Leroi Haque presented with shortness of breath, oxygen sats in the 70's, weight gain , volume overload, 4L o2 started, feels chest tightness , but not pain. Patient feels abdomen has a slight tightness to it. BP 128/73, HR 81, BNP 769.5, Troponin 168, EKG showed NSR with some PVCright heart cath this admission showed a wedge pressure of 23, PA pressures of 59/23.   ECHO/ LVEF: Ef 25-30% HFrEF  Clinical Course:  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)    BPH (benign prostatic hyperplasia)    CAD (coronary artery disease)    CHF (congestive heart failure) (HCC)    Cholesteatoma of attic    Depression    Diabetes mellitus (HCC)    Ear disease    GERD (gastroesophageal reflux disease)    Hyperlipidemia    Hypertension    PVD (peripheral vascular disease) ([place])    Stroke [facility])      Social History   Socioeconomic History   Marital status: Widowed    Spouse name: Not on file   Number of children: 3   Years of education: Not on file   Highest education level: Not on file  Occupational History   Occupation: retired  Tobacco Use   Smoking status: Former    Types: Cigarettes    Quit date: 05/19/1989    Years since quitting: 32.8   Smokeless tobacco: Never  Vaping Use   Vaping Use: Never used  Substance and Sexual Activity   Alcohol use: Not Currently   Drug use: No   Sexual activity: Not on file  Other Topics Concern   Not on file  Social History Narrative   Not on file   Social Determinants of Health   Financial Resource Strain: Not on file  Food Insecurity: No Food Insecurity   Worried About Running Out of Food in the Last Year: Never true   [place] in the Last Year: Never true  Transportation Needs: No Transportation Needs    Lack of Transportation (Medical): No   Lack of Transportation (Non-Medical): No  Physical Activity: Not on file  Stress: Not on file  Social Connections: Not on file    High Risk Criteria for Readmission and/or Poor Patient Outcomes: Heart failure hospital admissions (last 6 months): 2  No Show rate: 3% Difficult social situation: No Demonstrates medication adherence: Yes Primary Language: English Literacy level: Reading, writing.   Barriers of Care:   GDMT limited with CKD  IV  Considerations/Referrals:   Referral made to Heart Failure Pharmacist Stewardship: yes Referral made to Heart Failure CSW/NCM TOC: no Referral made to Heart & Vascular TOC clinic: yes, 04/10/22 @ 10 am.   Items for Follow-up on DC/TOC: Diet/ fluids restrictions Daily weights   Earnestine Leys, BSN, RN Heart Failure Transport planner Only

## 2022-03-28 NOTE — Progress Notes (Signed)
PROGRESS NOTE    John [name]  BDZ:[id number] DOB: 27-Dec-1940 DOA: 03/23/2022 PCP: [name], DO  81 y.o. male with a history of chronic 4L O2 dependence, COPD, asbestosis, combined HFrEF, PAF, CAD, T2DM, HTN, HLD, PAD, history of CVA, and stage IV CKD who presented to the ED 6/2 with shortness of breath, weight gain, and hypoxia.  He was noted to be volume overloaded, right heart cath this admission noted wedge pressure of 23, PA pressures of 59/23 consistent with volume overload, cardiology following, improving on diuretics   Subjective: -Feels okay, breathing is improving, reports intermittent urinary retention  Assessment and Plan:  Acute on chronic HFrEF:  -LVEF 25-30% with anterolateral and apical hypokinesis.  -Cards following, right heart cath noted with pressure of 23, PA 59/23 -Diuresing on IV Lasix, urine output not accurate, weight is down 5 pounds -GDMT limited by CKD -Creatinine relatively stable -Remains on Lasix 80 Mg 3 times daily, getting extra metolazone today -Continue hydralazine, Imdur, Toprol, monitor BP -Increase activity, ambulate daily   CAD, ischemic cardiomyopathy -Troponin mildly elevated on admission, no symptoms of ACS, felt to be demand ischemia in the setting of CHF, although EF is a little lower than baseline -Suspected to have multivessel CAD, not felt to be a good candidate for revascularization either way and ischemic work-up deferred in the setting of CKD -Continue Toprol, Plavix, Imdur and statin  Stage IV CKD -Creatinine relatively stable, continue current dose of diuretics, avoid hypotension, likely not a good dialysis candidate long-term   Paroxysmal atrial fibrillation: Currently maintaining NSR - Continue metoprolol and low-dose Eliquis   Acute on chronic hypoxic respiratory failure: Due to acute cardiogenic pulmonary edema due to CHF decompensation, chronically related to COPD, asbestosis, OSA.  -On 4 L home O2 at baseline, wean O2  down to 4 as tolerated   COPD: No exacerbation currently.  - Continue bronchodilators.    OSA:  - Continue CPAP qHS.   BPH, urinary retention -Continue Flomax, check postvoid residual today    Hypokalemia: Improved  - Continue supplementation for level 3.7 with AFib and loop diuretic   T2DM: HbA1c was 7.1% in March 2023 indicating adequate-for-age glycemic control chronically.  - Uncontrolled with hypoglycemia while admitted. Home doses glargine 35u daily, 7u TIDWC novolog), decreased glargine insulin to 15u with reasonable control given the risk of adverse event with recurrent hypoglycemia, continue this and mealtime novolog 2u TIDWC + very sensitive SSI.    Anemia of CKD and iron deficiency: Recent folic acid, T41 are replete. -Iron stores low on anemia panel, will give IV iron   Thrombocytopenia: Stable without bleeding.  - Monitor intermittently   PAD: No critical ischemia. Hx fem-pop bypass Aug 2019. - Continue plavix, statin, risk factor optimization.    Depression, anxiety:  - Continue home sertraline and alprazolam.    BPH: No retention currently noted.  - Continue tamsulosin   HLD:  - Continue statin.    GERD:  - Continue PPI     DVT prophylaxis: Eliquis Code Status: Full code Family Communication: Discussed patient and daughter at bedside Disposition Plan: Home likely 48 hours  Consultants: Cardiology   Procedures:   Antimicrobials:    Objective: Vitals:   03/28/22 0755 03/28/22 0758 03/28/22 1014 03/28/22 1104  BP: (!) 111/59 116/61 125/76 119/69  Pulse: 90 78 87 75  Resp: 18 18  19   Temp:    98.2 F (36.8 C)  TempSrc:    Oral  SpO2: 96% 97% 97% 99%  Weight:  Height:        Intake/Output Summary (Last 24 hours) at 03/28/2022 1128 Last data filed at 03/28/2022 0758 Gross per 24 hour  Intake 480 ml  Output 1345 ml  Net -865 ml   Filed Weights   03/26/22 0540 03/27/22 0415 03/28/22 0347  Weight: 82.2 kg 81.6 kg 81.2 kg     Examination:  General exam: Elderly male sitting up in bed, AAOx3, no distress HEENT: No JVD CVS: S1-S2, regular rhythm Lungs: Few basilar rales Abdomen: Soft, obese, nontender, bowel sounds present Extremities: 1+ edema  Skin: Chronic venous stasis changes Psychiatry:  Mood & affect appropriate.     Data Reviewed:   CBC: Recent Labs  Lab 03/23/22 0737 03/24/22 0311 03/26/22 0315 03/26/22 0943  WBC 12.1* 9.1 7.9  --   HGB 8.8* 8.4* 8.5* 9.2*  9.2*  HCT 29.8* 27.3* 26.9* 27.0*  27.0*  MCV 93.7 91.6 91.2  --   PLT 141* 127* 139*  --    Basic Metabolic Panel: Recent Labs  Lab 03/24/22 0311 03/25/22 0402 03/26/22 0315 03/26/22 0943 03/27/22 0425 03/28/22 0410  NA 138 134* 136 138  139 137 138  K 3.1* 4.0 3.7 3.9  3.8 4.0 3.3*  CL 101 99 99  --  100 95*  CO2 26 27 29   --  27 31  GLUCOSE 74 189* 136*  --  187* 145*  BUN 57* 54* 58*  --  62* 62*  CREATININE 1.95* 1.91* 2.29*  --  2.26* 2.32*  CALCIUM 8.7* 8.8* 9.1  --  9.0 9.0  MG 2.0  --  2.1  --   --   --    GFR: Estimated Creatinine Clearance: 24.6 mL/min (A) (by C-G formula based on SCr of 2.32 mg/dL (H)). Liver Function Tests: Recent Labs  Lab 03/23/22 0737  AST 16  ALT 13  ALKPHOS 51  BILITOT 0.9  PROT 5.9*  ALBUMIN 3.1*   No results for input(s): LIPASE, AMYLASE in the last 168 hours. No results for input(s): AMMONIA in the last 168 hours. Coagulation Profile: No results for input(s): INR, PROTIME in the last 168 hours. Cardiac Enzymes: No results for input(s): CKTOTAL, CKMB, CKMBINDEX, TROPONINI in the last 168 hours. BNP (last 3 results) No results for input(s): PROBNP in the last 8760 hours. HbA1C: No results for input(s): HGBA1C in the last 72 hours. CBG: Recent Labs  Lab 03/27/22 1624 03/27/22 2131 03/28/22 0612 03/28/22 0739 03/28/22 1104  GLUCAP 208* 202* 149* 149* 252*   Lipid Profile: No results for input(s): CHOL, HDL, LDLCALC, TRIG, CHOLHDL, LDLDIRECT in the last 72  hours. Thyroid Function Tests: No results for input(s): TSH, T4TOTAL, FREET4, T3FREE, THYROIDAB in the last 72 hours. Anemia Panel: No results for input(s): VITAMINB12, FOLATE, FERRITIN, TIBC, IRON, RETICCTPCT in the last 72 hours. Urine analysis:    Component Value Date/Time   COLORURINE YELLOW 03/23/2022 [place] 03/23/2022 1323   LABSPEC 1.009 03/23/2022 1323   PHURINE 5.0 03/23/2022 1323   GLUCOSEU NEGATIVE 03/23/2022 1323   HGBUR NEGATIVE 03/23/2022 1323   BILIRUBINUR NEGATIVE 03/23/2022 1323   KETONESUR NEGATIVE 03/23/2022 1323   PROTEINUR NEGATIVE 03/23/2022 1323   NITRITE NEGATIVE 03/23/2022 1323   LEUKOCYTESUR NEGATIVE 03/23/2022 1323   Sepsis Labs: @LABRCNTIP (procalcitonin:4,lacticidven:4)  ) Recent Results (from the past 240 hour(s))  Resp Panel by RT-PCR (Flu A&B, Covid) Anterior Nasal Swab     Status: None   Collection Time: 03/23/22  7:56 AM   Specimen:  Anterior Nasal Swab  Result Value Ref Range Status   SARS Coronavirus 2 by RT PCR NEGATIVE NEGATIVE Final    Comment: (NOTE) SARS-CoV-2 target nucleic acids are NOT DETECTED.  The SARS-CoV-2 RNA is generally detectable in upper respiratory specimens during the acute phase of infection. The lowest concentration of SARS-CoV-2 viral copies this assay can detect is 138 copies/mL. A negative result does not preclude SARS-Cov-2 infection and should not be used as the sole basis for treatment or other patient management decisions. A negative result may occur with  improper specimen collection/handling, submission of specimen other than nasopharyngeal swab, presence of viral mutation(s) within the areas targeted by this assay, and inadequate number of viral copies(<138 copies/mL). A negative result must be combined with clinical observations, patient history, and epidemiological information. The expected result is Negative.  Fact Sheet for Patients:   EntrepreneurPulse.com.au  Fact Sheet for Healthcare Providers:  IncredibleEmployment.be  This test is no t yet approved or cleared by the [place] FDA and  has been authorized for detection and/or diagnosis of SARS-CoV-2 by FDA under an Emergency Use Authorization (EUA). This EUA will remain  in effect (meaning this test can be used) for the duration of the COVID-19 declaration under Section 564(b)(1) of the Act, 21 U.S.C.section 360bbb-3(b)(1), unless the authorization is terminated  or revoked sooner.       Influenza A by PCR NEGATIVE NEGATIVE Final   Influenza B by PCR NEGATIVE NEGATIVE Final    Comment: (NOTE) The Xpert Xpress SARS-CoV-2/FLU/RSV plus assay is intended as an aid in the diagnosis of influenza from Nasopharyngeal swab specimens and should not be used as a sole basis for treatment. Nasal washings and aspirates are unacceptable for Xpert Xpress SARS-CoV-2/FLU/RSV testing.  Fact Sheet for Patients: EntrepreneurPulse.com.au  Fact Sheet for Healthcare Providers: IncredibleEmployment.be  This test is not yet approved or cleared by the [place] FDA and has been authorized for detection and/or diagnosis of SARS-CoV-2 by FDA under an Emergency Use Authorization (EUA). This EUA will remain in effect (meaning this test can be used) for the duration of the COVID-19 declaration under Section 564(b)(1) of the Act, 21 U.S.C. section 360bbb-3(b)(1), unless the authorization is terminated or revoked.  Performed at [facility], [place] [address], [place], [place] [address]      Radiology Studies: No results found.   Scheduled Meds:  allopurinol  300 mg Oral Daily   apixaban  2.5 mg Oral BID   clopidogrel  75 mg Oral Daily   ferrous sulfate  325 mg Oral BID WC   furosemide  80 mg Intravenous TID PC   gabapentin  300 mg Oral QHS   hydrALAZINE  25 mg Oral BID   insulin aspart  0-6  Units Subcutaneous TID WC   insulin aspart  3 Units Subcutaneous TID WC   insulin detemir  15 Units Subcutaneous Daily   isosorbide dinitrate  20 mg Oral BID   metoprolol succinate  50 mg Oral Daily   mometasone-formoterol  2 puff Inhalation BID   pantoprazole  40 mg Oral Q breakfast   potassium chloride  40 mEq Oral BID   rosuvastatin  10 mg Oral QPM   sertraline  25 mg Oral QPM   sodium chloride flush  3 mL Intravenous Q12H   sodium chloride flush  3 mL Intravenous Q12H   sodium chloride flush  3 mL Intravenous Q12H   tamsulosin  0.4 mg Oral Daily   thiamine  100 mg Oral  Daily   Continuous Infusions:  sodium chloride       LOS: 5 days    Time spent: 39min    Domenic Polite, MD [facility]   03/28/2022, 11:28 AM

## 2022-03-28 NOTE — Plan of Care (Signed)
  Problem: Education: Goal: Ability to demonstrate management of disease process will improve Outcome: Progressing   Problem: Cardiac: Goal: Ability to achieve and maintain adequate cardiopulmonary perfusion will improve Outcome: Progressing   Problem: Coping: Goal: Ability to adjust to condition or change in health will improve Outcome: Progressing   Problem: Fluid Volume: Goal: Ability to maintain a balanced intake and output will improve Outcome: Progressing

## 2022-03-28 NOTE — Progress Notes (Addendum)
Progress Note  Patient Name: John [name] Date of Encounter: 03/28/2022  [facility] Cardiologist: Kirk Ruths, MD   Subjective   No CP this am and breathing improved.  Volume overloaded on cath this admit.  Diuretics increased yesterday without significant increase in UOP.    Inpatient Medications    Scheduled Meds:  allopurinol  300 mg Oral Daily   apixaban  2.5 mg Oral BID   clopidogrel  75 mg Oral Daily   ferrous sulfate  325 mg Oral BID WC   furosemide  80 mg Intravenous TID PC   gabapentin  300 mg Oral QHS   hydrALAZINE  25 mg Oral BID   insulin aspart  0-6 Units Subcutaneous TID WC   insulin aspart  3 Units Subcutaneous TID WC   insulin detemir  15 Units Subcutaneous Daily   isosorbide dinitrate  20 mg Oral BID   metoprolol succinate  50 mg Oral Daily   mometasone-formoterol  2 puff Inhalation BID   pantoprazole  40 mg Oral Q breakfast   potassium chloride  40 mEq Oral BID   rosuvastatin  10 mg Oral QPM   sertraline  25 mg Oral QPM   sodium chloride flush  3 mL Intravenous Q12H   sodium chloride flush  3 mL Intravenous Q12H   sodium chloride flush  3 mL Intravenous Q12H   tamsulosin  0.4 mg Oral Daily   thiamine  100 mg Oral Daily   Continuous Infusions:  sodium chloride     PRN Meds: sodium chloride, acetaminophen **OR** acetaminophen, albuterol, ALPRAZolam, calcium carbonate, morphine injection, ondansetron (ZOFRAN) IV, sodium chloride flush   Vital Signs    Vitals:   03/28/22 0347 03/28/22 0710 03/28/22 0755 03/28/22 0758  BP: 118/62 122/72 (!) 111/59 116/61  Pulse: 68 64 90 78  Resp: 18 18 18 18   Temp: 97.6 F (36.4 C) (!) 97.5 F (36.4 C)    TempSrc: Oral Oral    SpO2: 100% 98% 96% 97%  Weight: 81.2 kg     Height:        Intake/Output Summary (Last 24 hours) at 03/28/2022 0934 Last data filed at 03/28/2022 0758 Gross per 24 hour  Intake 480 ml  Output 1345 ml  Net -865 ml      03/28/2022    3:47 AM 03/27/2022    4:15 AM 03/26/2022     5:40 AM  Last 3 Weights  Weight (lbs) 179 lb 1.6 oz 179 lb 14.4 oz 181 lb 3.2 oz  Weight (kg) 81.239 kg 81.602 kg 82.192 kg      Telemetry    NSR with PVCs and bigeminal PVCs- Personally Reviewed  ECG    No new EKG to review- Personally Reviewed  Physical Exam  GEN: Well nourished, well developed in no acute distress HEENT: Normal NECK: No JVD; No carotid bruits LYMPHATICS: No lymphadenopathy CARDIAC:RRR, no murmurs, rubs, gallops RESPIRATORY:  few crackles at bases ABDOMEN: Soft, non-tender, non-distended MUSCULOSKELETAL:  No edema; No deformity  SKIN: Warm and dry NEUROLOGIC:  Alert and oriented x 3 PSYCHIATRIC:  Normal affect   Labs    High Sensitivity Troponin:   Recent Labs  Lab 03/23/22 0737 03/23/22 0920  TROPONINIHS 112* 168*     Chemistry Recent Labs  Lab 03/23/22 0737 03/24/22 0311 03/25/22 0402 03/26/22 0315 03/26/22 0943 03/27/22 0425 03/28/22 0410  NA 139 138   < > 136 138  139 137 138  K 3.5 3.1*   < > 3.7 3.9  3.8 4.0 3.3*  CL 99 101   < > 99  --  100 95*  CO2 29 26   < > 29  --  27 31  GLUCOSE 176* 74   < > 136*  --  187* 145*  BUN 56* 57*   < > 58*  --  62* 62*  CREATININE 2.23* 1.95*   < > 2.29*  --  2.26* 2.32*  CALCIUM 8.8* 8.7*   < > 9.1  --  9.0 9.0  MG  --  2.0  --  2.1  --   --   --   PROT 5.9*  --   --   --   --   --   --   ALBUMIN 3.1*  --   --   --   --   --   --   AST 16  --   --   --   --   --   --   ALT 13  --   --   --   --   --   --   ALKPHOS 51  --   --   --   --   --   --   BILITOT 0.9  --   --   --   --   --   --   GFRNONAA 29* 34*   < > 28*  --  28* 27*  ANIONGAP 11 11   < > 8  --  10 12   < > = values in this interval not displayed.    Lipids No results for input(s): CHOL, TRIG, HDL, LABVLDL, LDLCALC, CHOLHDL in the last 168 hours.  Hematology Recent Labs  Lab 03/23/22 0737 03/24/22 0311 03/26/22 0315 03/26/22 0943  WBC 12.1* 9.1 7.9  --   RBC 3.18* 2.98* 2.95*  --   HGB 8.8* 8.4* 8.5* 9.2*  9.2*  HCT  29.8* 27.3* 26.9* 27.0*  27.0*  MCV 93.7 91.6 91.2  --   MCH 27.7 28.2 28.8  --   MCHC 29.5* 30.8 31.6  --   RDW 17.9* 18.2* 18.7*  --   PLT 141* 127* 139*  --    Thyroid No results for input(s): TSH, FREET4 in the last 168 hours.  BNP Recent Labs  Lab 03/23/22 0737 03/26/22 0315  BNP 769.5* 795.0*    DDimer No results for input(s): DDIMER in the last 168 hours.   Radiology    No results found.  Cardiac Studies   Echocardiogram (limited) 03/08/22 1. Limited study.   2. Poor acoustic windows.   3. LVEF is severely depressed with hypokinesis worse in the lateral,  anterior and apical walls . Left ventricular ejection fraction, by  estimation, is 25 to 30%. The left ventricle has severely decreased  function. The left ventricular internal cavity  size was moderately dilated. There is mild left ventricular hypertrophy.   4. Right ventricular systolic function is normal. The right ventricular  size is normal.   5. The inferior vena cava is dilated in size with <50% respiratory  variability, suggesting right atrial pressure of 15 mmHg.   Patient Profile     81 y.o. 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 Suspect readmission is related to insufficient diuresis.   -Baseline BNP probably 500.  -Right heart cath this admit showed  RAP 14/11, RVP 58/7, PAP 59/23 with mean of 41 and pulmonary capillary wedge mean 23 mmHg all consistent with ongoing volume overload -still has crackles at bases -He put out 1 L yesterday and is net -1.76L since admit -weight unchanged from yesterday and net neg 5lbs from  admit -Serum creatinine back and forth with diuresis 2.29->>2.26->>2.32 today and potassium is 3.3 today -continue Lasix to 80 mg IV 3 times daily and add dose of Metolazone 5mg  today -continue Hydralazine 25mg  BID, Isosorbide dinitrate 20mg  BID and Toprol XL 50mg  daily -no ACEI/ARB/ARNi/MRA due to CKD -Follow daily weights, I's and O's and renal function closely while diuresing -if UOP does not pick up may need to consider lasix gtt  2. CAD/Ischemic cardiomyopathy  - Most recent echo (limited) on 03/08/22 showed EF 25-30%, severely decreased LV function.  NSTEMI earlier this year. - Patient has a history of CAD, DESx2 to LAD in 2013 (EF 40%). NSTEMI in 12/2021 that was managed medically due to AKI (EF 35%)  -Currently without angina -Current minor elevation in troponin is consistent with subendocardial ischemia due to heart failure exacerbation, rather than a new acute coronary event.   - It is likely that he now has multivessel CAD.  He is not a good candidate for surgical revascularization.  PCI is not likely to lead to significant improvement in left ventricular function. All told, I believe the benefit of left heart catheterization would be limited and the risk of complications (specifically renal failure) would be high. In my opinion, left heart catheterization and coronary angiography -He has not had any angina -Continue Plavix 75 mg daily, Toprol-XL 50 mg daily and isosorbide dinitrate 20 mg twice daily along with statin therapy -No aspirin due to Eliquis   3. Paroxysmal Atrial Fibrillation  -He remains in normal sinus rhythm on telemetry -Continue Eliquis 2.5 mg twice daily (dose reduced due to age, renal function) and Toprol-XL 50 mg daily   4. CKD stage 4  - Monitor daily.  Serum creatinine around 2.29-2.37 with diuresis - Very high risk for contrast nephrotoxicity and he would be a poor candidate for chronic hemodialysis.   5.  Chronic respiratory sufficiency with hypoxia:   -related to COPD, asbestosis, longstanding obstructive sleep apnea and volume overload.   -Right heart catheterization consistent with ongoing volume overload requiring diuresis   6. Hypokalemia -replete K+ to keep > 4    I have spent a total of 35 minutes with patient reviewing cardiac cath , telemetry, EKGs, labs and examining patient as well as establishing an assessment and plan that was discussed with the patient.  > 50% of time was spent in direct patient care.       For questions or updates, please contact [place] Please consult [email or web address] for contact info under        Signed, Fransico Him, MD  03/28/2022, 9:34 AM

## 2022-03-28 NOTE — Progress Notes (Signed)
Mobility Specialist Progress Note:   03/28/22 1524  Mobility  Activity Ambulated with assistance in hallway  Level of Assistance Standby assist, set-up cues, supervision of patient - no hands on  Assistive Device Four wheel walker  Distance Ambulated (ft) 550 ft  Activity Response Tolerated well  $Mobility charge 1 Mobility   Pt EOB willing to participate in mobility. No complaints of pain. Pt required 3 short seated breaks. Left EOB with call bell in reach and all needs met.    [facility] Shirle Provencal Mobility Specialist

## 2022-03-29 ENCOUNTER — Other Ambulatory Visit: Payer: Self-pay | Admitting: *Deleted

## 2022-03-29 ENCOUNTER — Inpatient Hospital Stay (HOSPITAL_COMMUNITY): Payer: Medicare Other

## 2022-03-29 DIAGNOSIS — I5043 Acute on chronic combined systolic (congestive) and diastolic (congestive) heart failure: Secondary | ICD-10-CM | POA: Diagnosis not present

## 2022-03-29 DIAGNOSIS — N184 Chronic kidney disease, stage 4 (severe): Secondary | ICD-10-CM | POA: Diagnosis not present

## 2022-03-29 DIAGNOSIS — N401 Enlarged prostate with lower urinary tract symptoms: Secondary | ICD-10-CM | POA: Diagnosis not present

## 2022-03-29 DIAGNOSIS — I25708 Atherosclerosis of coronary artery bypass graft(s), unspecified, with other forms of angina pectoris: Secondary | ICD-10-CM | POA: Diagnosis not present

## 2022-03-29 LAB — GLUCOSE, CAPILLARY
Glucose-Capillary: 192 mg/dL — ABNORMAL HIGH (ref 70–99)
Glucose-Capillary: 360 mg/dL — ABNORMAL HIGH (ref 70–99)
Glucose-Capillary: 148 mg/dL — ABNORMAL HIGH (ref 70–99)
Glucose-Capillary: 279 mg/dL — ABNORMAL HIGH (ref 70–99)

## 2022-03-29 LAB — BASIC METABOLIC PANEL
Anion gap: 12 (ref 5–15)
BUN: 68 mg/dL — ABNORMAL HIGH (ref 8–23)
CO2: 28 mmol/L (ref 22–32)
Calcium: 8.8 mg/dL — ABNORMAL LOW (ref 8.9–10.3)
Chloride: 96 mmol/L — ABNORMAL LOW (ref 98–111)
Creatinine, Ser: 2.49 mg/dL — ABNORMAL HIGH (ref 0.61–1.24)
GFR, Estimated: 25 mL/min — ABNORMAL LOW (ref >60)
Glucose, Bld: 186 mg/dL — ABNORMAL HIGH (ref 70–99)
Potassium: 4.1 mmol/L (ref 3.5–5.1)
Sodium: 136 mmol/L (ref 135–145)

## 2022-03-29 LAB — CBC
HCT: 27.1 % — ABNORMAL LOW (ref 39.0–52.0)
Hemoglobin: 8.3 g/dL — ABNORMAL LOW (ref 13.0–17.0)
MCH: 28.6 pg (ref 26.0–34.0)
MCHC: 30.6 g/dL (ref 30.0–36.0)
MCV: 93.4 fL (ref 80.0–100.0)
Platelets: 146 10*3/uL — ABNORMAL LOW (ref 150–400)
RBC: 2.9 MIL/uL — ABNORMAL LOW (ref 4.22–5.81)
RDW: 18.6 % — ABNORMAL HIGH (ref 11.5–15.5)
WBC: 6.6 10*3/uL (ref 4.0–10.5)
nRBC: 0 % (ref 0.0–0.2)

## 2022-03-29 LAB — BRAIN NATRIURETIC PEPTIDE: B Natriuretic Peptide: 492.2 pg/mL — ABNORMAL HIGH (ref 0.0–100.0)

## 2022-03-29 MED ORDER — INSULIN ASPART 100 UNIT/ML IJ SOLN
0.0000 [IU] | Freq: Three times a day (TID) | INTRAMUSCULAR | Status: DC
  Administered 2022-03-30 – 2022-03-31 (×4): 1 [IU] via SUBCUTANEOUS
  Administered 2022-03-31: 2 [IU] via SUBCUTANEOUS

## 2022-03-29 NOTE — Plan of Care (Signed)
  Problem: Education: Goal: Ability to demonstrate management of disease process will improve Outcome: Progressing   Problem: Cardiac: Goal: Ability to achieve and maintain adequate cardiopulmonary perfusion will improve Outcome: Progressing   Problem: Health Behavior/Discharge Planning: Goal: Ability to manage health-related needs will improve Outcome: Progressing   Problem: Metabolic: Goal: Ability to maintain appropriate glucose levels will improve Outcome: Progressing   Problem: Nutritional: Goal: Maintenance of adequate nutrition will improve Outcome: Progressing   Problem: Skin Integrity: Goal: Risk for impaired skin integrity will decrease Outcome: Progressing

## 2022-03-29 NOTE — Progress Notes (Signed)
Heart Failure Stewardship Pharmacist Progress Note   PCP: [name], DO PCP-Cardiologist: Kirk Ruths, MD    HPI:  81 yo M with PMH of CHF, afib, CAD, HTN, HLD, PAD, CVA, chronic respiratory failure on 4L, T2DM, COPD, and CKD IV.   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 However, in June 2013, EF was 40% with ischemia on LHC - PCI x 2 to LAD. He had an ECHO done in 12/2020 and LVEF was 50-55%. It has since gradually reduced.  He presented to the ED on 6/2 with shortness of breath, orthopnea, and chest tightness. Also endorses abdominal edema. CXR with cardiomegaly and pulmonary edema. His last ECHO was on 5/18 and LVEF was 25-30% with mild LVH. RHC on 6/5 with elevated filling pressures - RA 9, PA 41, wedge 23, CO 5.3, CI 2.7. IV diuresis continued.  Current HF Medications: Diuretic: furosemide 80 mg IV TID Beta Blocker: metoprolol XL 50 mg daily Other: hydralazine 25 mg BID; Isordil 20 mg BID  Prior to admission HF Medications: Diuretic: torsemide 40 mg BID Beta blocker: metoprolol XL 50 mg daily Other: hydralazine 25 mg BID + Isordil 20 mg BID  Pertinent Lab Values: Serum creatinine 2.49, BUN 68, Potassium 4.1, Sodium 136, BNP 795, Magnesium 2.1, A1c 7.1 (12/24/21)  TSAT 7, Ferritin 81 (5/23)  Vital Signs: Weight: 176 lbs (admission weight: 182 lbs) Blood pressure: 110-120/60s  Heart rate: 60-70s  I/O: -3.1L yesterday; net -3.5L  Medication Assistance / Insurance Benefits Check: Does the patient have prescription insurance?  Yes Type of insurance plan: [place] [employer]  Outpatient Pharmacy:  Prior to admission outpatient pharmacy: [facility] Is the patient willing to use [facility] at discharge? Yes Is the patient willing to transition their outpatient pharmacy to utilize a [facility] outpatient pharmacy?   Pending    Assessment: 1. Acute on chronic systolic CHF (LVEF 74-08%), due to ICM. NYHA class III symptoms. - Urine output increased after IV lasix 80 mg  TID + metolazone 5 mg yesterday. Previously noted that I/O inaccurate. Consider repeating metolazone today for additional diuresis. Keep K>4, likely needs at least 17mEq daily while on higher dose of IV lasix - Continue metoprolol XL 50 mg daily - No ACE/ARB/ARNI, spironolactone, or SGLT2i with AKI on CKD - Continue hydralazine 25 mg BID + Isordil 20 mg BID   Plan: 1) Medication changes recommended at this time: - Metolazone 2.5-5 mg x 1 - KCl 40 mEq daily while on high dose of diuretics - Check magnesium tomorrow AM  2) Patient assistance: Delene Loll copay $195 - Farxiga copay $40  3)  Education  - To be completed prior to discharge  Kerby Nora, PharmD, BCPS Heart Failure Stewardship Pharmacist Phone [phone]

## 2022-03-29 NOTE — Progress Notes (Signed)
PROGRESS NOTE    John [name]  TOI:[id number] DOB: December 09, 1940 DOA: 03/23/2022 PCP: [name], DO  81 y.o. male with a history of chronic 4L O2 dependence, COPD, asbestosis, combined HFrEF, PAF, CAD, T2DM, HTN, HLD, PAD, history of CVA, and stage IV CKD who presented to the ED 6/2 with shortness of breath, weight gain, and hypoxia.  He was noted to be volume overloaded, right heart cath this admission noted wedge pressure of 23, PA pressures of 59/23 consistent with volume overload, cardiology following, improving on diuretics   Subjective: -No issues with urinary retention yesterday, breathing continues to improve, ambulating some  Assessment and Plan:  Acute on chronic HFrEF:  -LVEF 25-30% with anterolateral and apical hypokinesis.  -Cards following, right heart cath noted with pressure of 23, PA 59/23 -Diuresing on IV Lasix, urine output not accurate, weight down 8lbs -GDMT limited by CKD -Mild bump in creatinine, cards holding diuretics today -Continue hydralazine, Imdur, Toprol, monitor BP -Increase activity, ambulate daily   CAD, ischemic cardiomyopathy -Troponin mildly elevated on admission, no symptoms of ACS, felt to be demand ischemia in the setting of CHF, although EF is a little lower than baseline -Suspected to have multivessel CAD, not felt to be a good candidate for revascularization either way and ischemic work-up deferred in the setting of CKD -Continue Toprol, Plavix, Imdur and statin  Stage IV CKD -Creatinine 2.4 today, baseline around 2.2, cards holding diuretics today, see discussion above, avoid hypotension, likely not a good dialysis candidate long-term   Paroxysmal atrial fibrillation: Currently maintaining NSR - Continue metoprolol and low-dose Eliquis   Acute on chronic hypoxic respiratory failure: Due to acute cardiogenic pulmonary edema due to CHF decompensation, chronically related to COPD, asbestosis, OSA.  -On 4 L home O2 at baseline, wean O2 down to  4 as tolerated   COPD: No exacerbation currently.  - Continue bronchodilators.    OSA:  - Continue CPAP qHS.   BPH, urinary retention -Continue Flomax, check postvoid residual today    Hypokalemia: Improved  - Continue supplementation   T2DM: HbA1c was 7.1% in March 2023 indicating adequate-for-age glycemic control chronically.  -  Home doses glargine 35u daily, 7u TIDWC novolog), CBGs up and down, increasing again with increase glargine dose   Anemia of CKD and iron deficiency: Recent folic acid, I33 are replete. -Iron stores low on anemia panel, given IV iron this admission   Thrombocytopenia: Stable without bleeding.  - Monitor intermittently   PAD: No critical ischemia. Hx fem-pop bypass Aug 2019. - Continue plavix, statin, risk factor optimization.    Depression, anxiety:  - Continue home sertraline and alprazolam.    BPH: No retention currently noted.  - Continue tamsulosin   HLD:  - Continue statin.    GERD:  - Continue PPI     DVT prophylaxis: Eliquis Code Status: Full code Family Communication: Discussed patient and daughter at bedside yesterday Disposition Plan: Home likely 48 hours  Consultants: Cardiology   Procedures:   Antimicrobials:    Objective: Vitals:   03/29/22 0749 03/29/22 0909 03/29/22 1014 03/29/22 1112  BP: 109/66  123/66 124/67  Pulse: 68  70 69  Resp: 16   18  Temp:    98.2 F (36.8 C)  TempSrc:    Oral  SpO2: 100% 98% 94% 95%  Weight:      Height:        Intake/Output Summary (Last 24 hours) at 03/29/2022 1215 Last data filed at 03/29/2022 1120 Gross per 24 hour  Intake 1077.25 ml  Output 3225 ml  Net -2147.75 ml   Filed Weights   03/27/22 0415 03/28/22 0347 03/29/22 0353  Weight: 81.6 kg 81.2 kg 80.1 kg    Examination:  General exam: Elderly chronically ill male sitting up in bed, AAOx3, no distress HEENT: No JVD CVS: S1-S2, regular rhythm Lungs: Rare basilar rales Abdomen: Soft, obese, nontender bowel  sounds present Extremities: Trace edema  Skin: Chronic venous stasis changes Psychiatry:  Mood & affect appropriate.     Data Reviewed:   CBC: Recent Labs  Lab 03/23/22 0737 03/24/22 0311 03/26/22 0315 03/26/22 0943 03/29/22 0253  WBC 12.1* 9.1 7.9  --  6.6  HGB 8.8* 8.4* 8.5* 9.2*  9.2* 8.3*  HCT 29.8* 27.3* 26.9* 27.0*  27.0* 27.1*  MCV 93.7 91.6 91.2  --  93.4  PLT 141* 127* 139*  --  703*   Basic Metabolic Panel: Recent Labs  Lab 03/24/22 0311 03/25/22 0402 03/26/22 0315 03/26/22 0943 03/27/22 0425 03/28/22 0410 03/29/22 0253  NA 138 134* 136 138  139 137 138 136  K 3.1* 4.0 3.7 3.9  3.8 4.0 3.3* 4.1  CL 101 99 99  --  100 95* 96*  CO2 26 27 29   --  27 31 28   GLUCOSE 74 189* 136*  --  187* 145* 186*  BUN 57* 54* 58*  --  62* 62* 68*  CREATININE 1.95* 1.91* 2.29*  --  2.26* 2.32* 2.49*  CALCIUM 8.7* 8.8* 9.1  --  9.0 9.0 8.8*  MG 2.0  --  2.1  --   --   --   --    GFR: Estimated Creatinine Clearance: 22.8 mL/min (A) (by C-G formula based on SCr of 2.49 mg/dL (H)). Liver Function Tests: Recent Labs  Lab 03/23/22 0737  AST 16  ALT 13  ALKPHOS 51  BILITOT 0.9  PROT 5.9*  ALBUMIN 3.1*   No results for input(s): "LIPASE", "AMYLASE" in the last 168 hours. No results for input(s): "AMMONIA" in the last 168 hours. Coagulation Profile: No results for input(s): "INR", "PROTIME" in the last 168 hours. Cardiac Enzymes: No results for input(s): "CKTOTAL", "CKMB", "CKMBINDEX", "TROPONINI" in the last 168 hours. BNP (last 3 results) No results for input(s): "PROBNP" in the last 8760 hours. HbA1C: No results for input(s): "HGBA1C" in the last 72 hours. CBG: Recent Labs  Lab 03/28/22 1104 03/28/22 1608 03/28/22 2104 03/29/22 0613 03/29/22 1110  GLUCAP 252* 166* 178* 192* 360*   Lipid Profile: No results for input(s): "CHOL", "HDL", "LDLCALC", "TRIG", "CHOLHDL", "LDLDIRECT" in the last 72 hours. Thyroid Function Tests: No results for input(s):  "TSH", "T4TOTAL", "FREET4", "T3FREE", "THYROIDAB" in the last 72 hours. Anemia Panel: No results for input(s): "VITAMINB12", "FOLATE", "FERRITIN", "TIBC", "IRON", "RETICCTPCT" in the last 72 hours. Urine analysis:    Component Value Date/Time   COLORURINE YELLOW 03/23/2022 [place] 03/23/2022 1323   LABSPEC 1.009 03/23/2022 1323   PHURINE 5.0 03/23/2022 1323   GLUCOSEU NEGATIVE 03/23/2022 1323   HGBUR NEGATIVE 03/23/2022 1323   BILIRUBINUR NEGATIVE 03/23/2022 1323   KETONESUR NEGATIVE 03/23/2022 1323   PROTEINUR NEGATIVE 03/23/2022 1323   NITRITE NEGATIVE 03/23/2022 1323   LEUKOCYTESUR NEGATIVE 03/23/2022 1323   Sepsis Labs: @LABRCNTIP (procalcitonin:4,lacticidven:4)  ) Recent Results (from the past 240 hour(s))  Resp Panel by RT-PCR (Flu A&B, Covid) Anterior Nasal Swab     Status: None   Collection Time: 03/23/22  7:56 AM   Specimen: Anterior Nasal Swab  Result  Value Ref Range Status   SARS Coronavirus 2 by RT PCR NEGATIVE NEGATIVE Final    Comment: (NOTE) SARS-CoV-2 target nucleic acids are NOT DETECTED.  The SARS-CoV-2 RNA is generally detectable in upper respiratory specimens during the acute phase of infection. The lowest concentration of SARS-CoV-2 viral copies this assay can detect is 138 copies/mL. A negative result does not preclude SARS-Cov-2 infection and should not be used as the sole basis for treatment or other patient management decisions. A negative result may occur with  improper specimen collection/handling, submission of specimen other than nasopharyngeal swab, presence of viral mutation(s) within the areas targeted by this assay, and inadequate number of viral copies(<138 copies/mL). A negative result must be combined with clinical observations, patient history, and epidemiological information. The expected result is Negative.  Fact Sheet for Patients:  EntrepreneurPulse.com.au  Fact Sheet for Healthcare Providers:   IncredibleEmployment.be  This test is no t yet approved or cleared by the [place] FDA and  has been authorized for detection and/or diagnosis of SARS-CoV-2 by FDA under an Emergency Use Authorization (EUA). This EUA will remain  in effect (meaning this test can be used) for the duration of the COVID-19 declaration under Section 564(b)(1) of the Act, 21 U.S.C.section 360bbb-3(b)(1), unless the authorization is terminated  or revoked sooner.       Influenza A by PCR NEGATIVE NEGATIVE Final   Influenza B by PCR NEGATIVE NEGATIVE Final    Comment: (NOTE) The Xpert Xpress SARS-CoV-2/FLU/RSV plus assay is intended as an aid in the diagnosis of influenza from Nasopharyngeal swab specimens and should not be used as a sole basis for treatment. Nasal washings and aspirates are unacceptable for Xpert Xpress SARS-CoV-2/FLU/RSV testing.  Fact Sheet for Patients: EntrepreneurPulse.com.au  Fact Sheet for Healthcare Providers: IncredibleEmployment.be  This test is not yet approved or cleared by the [place] FDA and has been authorized for detection and/or diagnosis of SARS-CoV-2 by FDA under an Emergency Use Authorization (EUA). This EUA will remain in effect (meaning this test can be used) for the duration of the COVID-19 declaration under Section 564(b)(1) of the Act, 21 U.S.C. section 360bbb-3(b)(1), unless the authorization is terminated or revoked.  Performed at [facility], [place] [address], [place], [place] [address]      Radiology Studies: DG Chest 2 View  Result Date: 03/29/2022 CLINICAL DATA:  Congestive heart failure EXAM: CHEST - 2 VIEW COMPARISON:  03/23/2022 FINDINGS: Chronic cardiomegaly. Coronary artery stents. Aortic atherosclerotic calcification. Pulmonary venous hypertension with interstitial and early alveolar edema. Bilateral effusions larger on the right than the left, associated with atelectasis.  Findings slightly worsened since the prior exam. IMPRESSION: Persistent and slightly worsened congestive heart failure as above. Electronically Signed   By: Nelson Chimes M.D.   On: 03/29/2022 11:33     Scheduled Meds:  allopurinol  300 mg Oral Daily   apixaban  2.5 mg Oral BID   clopidogrel  75 mg Oral Daily   ferrous sulfate  325 mg Oral BID WC   gabapentin  300 mg Oral QHS   hydrALAZINE  25 mg Oral BID   insulin aspart  0-6 Units Subcutaneous TID WC   insulin aspart  3 Units Subcutaneous TID WC   insulin detemir  15 Units Subcutaneous Daily   isosorbide dinitrate  20 mg Oral BID   metoprolol succinate  50 mg Oral Daily   mometasone-formoterol  2 puff Inhalation BID   pantoprazole  40 mg Oral Q breakfast  rosuvastatin  10 mg Oral QPM   sertraline  25 mg Oral QPM   sodium chloride flush  3 mL Intravenous Q12H   sodium chloride flush  3 mL Intravenous Q12H   sodium chloride flush  3 mL Intravenous Q12H   tamsulosin  0.4 mg Oral Daily   thiamine  100 mg Oral Daily   Continuous Infusions:  sodium chloride       LOS: 6 days    Time spent: 44min    Domenic Polite, MD [facility]   03/29/2022, 12:15 PM

## 2022-03-29 NOTE — Progress Notes (Signed)
Progress Note  Patient Name: John [name] Date of Encounter: 03/29/2022  [facility] Cardiologist: Kirk Ruths, MD   Subjective    Volume overloaded on cath this admit.  Denies any chest pain or shortness of breath today.  He put out 3.1 L yesterday and is now net -4 L since admission.  Inpatient Medications    Scheduled Meds:  allopurinol  300 mg Oral Daily   apixaban  2.5 mg Oral BID   clopidogrel  75 mg Oral Daily   ferrous sulfate  325 mg Oral BID WC   furosemide  80 mg Intravenous TID PC   gabapentin  300 mg Oral QHS   hydrALAZINE  25 mg Oral BID   insulin aspart  0-6 Units Subcutaneous TID WC   insulin aspart  3 Units Subcutaneous TID WC   insulin detemir  15 Units Subcutaneous Daily   isosorbide dinitrate  20 mg Oral BID   metoprolol succinate  50 mg Oral Daily   mometasone-formoterol  2 puff Inhalation BID   pantoprazole  40 mg Oral Q breakfast   rosuvastatin  10 mg Oral QPM   sertraline  25 mg Oral QPM   sodium chloride flush  3 mL Intravenous Q12H   sodium chloride flush  3 mL Intravenous Q12H   sodium chloride flush  3 mL Intravenous Q12H   tamsulosin  0.4 mg Oral Daily   thiamine  100 mg Oral Daily   Continuous Infusions:  sodium chloride     PRN Meds: sodium chloride, acetaminophen **OR** acetaminophen, albuterol, ALPRAZolam, calcium carbonate, morphine injection, ondansetron (ZOFRAN) IV, sodium chloride flush   Vital Signs    Vitals:   03/29/22 0712 03/29/22 0749 03/29/22 0909 03/29/22 1014  BP: 122/65 109/66  123/66  Pulse: 62 68  70  Resp: 18 16    Temp: (!) 97.3 F (36.3 C)     TempSrc: Oral     SpO2: 97% 100% 98% 94%  Weight:      Height:        Intake/Output Summary (Last 24 hours) at 03/29/2022 1019 Last data filed at 03/29/2022 1003 Gross per 24 hour  Intake 1077.25 ml  Output 3350 ml  Net -2272.75 ml       03/29/2022    3:53 AM 03/28/2022    3:47 AM 03/27/2022    4:15 AM  Last 3 Weights  Weight (lbs) 176 lb 9.6 oz 179 lb 1.6  oz 179 lb 14.4 oz  Weight (kg) 80.105 kg 81.239 kg 81.602 kg      Telemetry    Normal sinus rhythm with PVCs -  personally Reviewed  ECG    No new EKG to review- Personally Reviewed  Physical Exam  GEN: Well nourished, well developed in no acute distress HEENT: Normal NECK: No JVD; No carotid bruits LYMPHATICS: No lymphadenopathy CARDIAC:RRR, no murmurs, rubs, gallops RESPIRATORY:  Clear to auscultation without rales, wheezing or rhonchi  ABDOMEN: Soft, non-tender, non-distended MUSCULOSKELETAL:  No edema; No deformity  SKIN: Warm and dry NEUROLOGIC:  Alert and oriented x 3 PSYCHIATRIC:  Normal affect   Labs    High Sensitivity Troponin:   Recent Labs  Lab 03/23/22 0737 03/23/22 0920  TROPONINIHS 112* 168*      Chemistry Recent Labs  Lab 03/23/22 0737 03/24/22 0311 03/25/22 0402 03/26/22 0315 03/26/22 0943 03/27/22 0425 03/28/22 0410 03/29/22 0253  NA 139 138   < > 136   < > 137 138 136  K 3.5 3.1*   < >  3.7   < > 4.0 3.3* 4.1  CL 99 101   < > 99  --  100 95* 96*  CO2 29 26   < > 29  --  27 31 28   GLUCOSE 176* 74   < > 136*  --  187* 145* 186*  BUN 56* 57*   < > 58*  --  62* 62* 68*  CREATININE 2.23* 1.95*   < > 2.29*  --  2.26* 2.32* 2.49*  CALCIUM 8.8* 8.7*   < > 9.1  --  9.0 9.0 8.8*  MG  --  2.0  --  2.1  --   --   --   --   PROT 5.9*  --   --   --   --   --   --   --   ALBUMIN 3.1*  --   --   --   --   --   --   --   AST 16  --   --   --   --   --   --   --   ALT 13  --   --   --   --   --   --   --   ALKPHOS 51  --   --   --   --   --   --   --   BILITOT 0.9  --   --   --   --   --   --   --   GFRNONAA 29* 34*   < > 28*  --  28* 27* 25*  ANIONGAP 11 11   < > 8  --  10 12 12    < > = values in this interval not displayed.     Lipids No results for input(s): "CHOL", "TRIG", "HDL", "LABVLDL", "LDLCALC", "CHOLHDL" in the last 168 hours.  Hematology Recent Labs  Lab 03/24/22 0311 03/26/22 0315 03/26/22 0943 03/29/22 0253  WBC 9.1 7.9  --  6.6   RBC 2.98* 2.95*  --  2.90*  HGB 8.4* 8.5* 9.2*  9.2* 8.3*  HCT 27.3* 26.9* 27.0*  27.0* 27.1*  MCV 91.6 91.2  --  93.4  MCH 28.2 28.8  --  28.6  MCHC 30.8 31.6  --  30.6  RDW 18.2* 18.7*  --  18.6*  PLT 127* 139*  --  146*    Thyroid No results for input(s): "TSH", "FREET4" in the last 168 hours.  BNP Recent Labs  Lab 03/23/22 0737 03/26/22 0315  BNP 769.5* 795.0*     DDimer No results for input(s): "DDIMER" in the last 168 hours.   Radiology    No results found.  Cardiac Studies   Echocardiogram (limited) 03/08/22 1. Limited study.   2. Poor acoustic windows.   3. LVEF is severely depressed with hypokinesis worse in the lateral,  anterior and apical walls . Left ventricular ejection fraction, by  estimation, is 25 to 30%. The left ventricle has severely decreased  function. The left ventricular internal cavity  size was moderately dilated. There is mild left ventricular hypertrophy.   4. Right ventricular systolic function is normal. The right ventricular  size is normal.   5. The inferior vena cava is dilated in size with <50% respiratory  variability, suggesting right atrial pressure of 15 mmHg.   Patient Profile     81 y.o. male with a hx of combined systolic and diastolic heart failure, PAF, CAD, HTN, HLD,  PAD, history of CVA, chronic respiratory failure with hypoxia on 4L, type 2 DM, asbestosis, COPD, and ckd stage IV who has a recurrent admission for decompensated heart failure  Assessment & Plan    Acute on Chronic HFrEF  -Achieving "dry weight" has been challenging due to concomitant problems with advanced chronic kidney disease. Suspect readmission is related to insufficient diuresis.   -Baseline BNP probably 500.  -Right heart cath this admit showed  RAP 14/11, RVP 58/7, PAP 59/23 with mean of 41 and pulmonary capillary wedge mean 23 mmHg all consistent with ongoing volume overload -He put out 3.1 L yesterday and is net -4 L since admit after getting a  dose of metolazone yesterday -weight down 8 pounds from admission -Serum creatinine back and forth with diuresis 2.29->>2.26->>2.32>> 2.49 today and potassium is 4.1 today -His lungs are clear today so we will hold Lasix today and check BNP and PA and lateral chest x-ray today to try to help guide further diuretic use since his creatinine has bumped further with diuresis -continue Hydralazine 25mg  BID, Isosorbide dinitrate 20mg  BID and Toprol XL 50mg  daily -no ACEI/ARB/ARNi/MRA due to CKD -Follow daily weights, I's and O's and renal function closely while diuresing  2. CAD/Ischemic cardiomyopathy  - Most recent echo (limited) on 03/08/22 showed EF 25-30%, severely decreased LV function.  NSTEMI earlier this year. - Patient has a history of CAD, DESx2 to LAD in 2013 (EF 40%). NSTEMI in 12/2021 that was managed medically due to AKI (EF 35%)  -Currently without angina -Current minor elevation in troponin is consistent with subendocardial ischemia due to heart failure exacerbation, rather than a new acute coronary event.   - It is likely that he now has multivessel CAD.  He is not a good candidate for surgical revascularization.  PCI is not likely to lead to significant improvement in left ventricular function. All told, I believe the benefit of left heart catheterization would be limited and the risk of complications (specifically renal failure) would be high. In my opinion, left heart catheterization and coronary angiography -He has not had any angina -Continue Plavix 75 mg daily, Toprol-XL 50 mg daily and isosorbide dinitrate 20 mg twice daily along with statin therapy -No aspirin due to Eliquis   3. Paroxysmal Atrial Fibrillation  -He remains in normal sinus rhythm on telemetry -Continue Eliquis 2.5 mg twice daily (dose reduced due to age, renal function) and Toprol-XL 50 mg daily   4. CKD stage 4  - Monitor daily.  Serum creatinine around 2.29-2.32-2.49 today with diuresis - Very high risk for  contrast nephrotoxicity and he would be a poor candidate for chronic hemodialysis.   5.  Chronic respiratory sufficiency with hypoxia:  -related to COPD, asbestosis, longstanding obstructive sleep apnea and volume overload.   -Right heart catheterization consistent with ongoing volume overload requiring diuresis   6. Hypokalemia -Potassium for today    I have spent a total of 35 minutes with patient reviewing cardiac cath , telemetry, EKGs, labs and examining patient as well as establishing an assessment and plan that was discussed with the patient.  > 50% of time was spent in direct patient care.       For questions or updates, please contact Ewa [name] Please consult [email or web address] for contact info under        Signed, Fransico Him, MD  03/29/2022, 10:19 AM

## 2022-03-30 DIAGNOSIS — N184 Chronic kidney disease, stage 4 (severe): Secondary | ICD-10-CM | POA: Diagnosis not present

## 2022-03-30 DIAGNOSIS — I10 Essential (primary) hypertension: Secondary | ICD-10-CM | POA: Diagnosis not present

## 2022-03-30 DIAGNOSIS — N179 Acute kidney failure, unspecified: Secondary | ICD-10-CM | POA: Diagnosis not present

## 2022-03-30 DIAGNOSIS — I25700 Atherosclerosis of coronary artery bypass graft(s), unspecified, with unstable angina pectoris: Secondary | ICD-10-CM | POA: Diagnosis not present

## 2022-03-30 DIAGNOSIS — I5043 Acute on chronic combined systolic (congestive) and diastolic (congestive) heart failure: Secondary | ICD-10-CM | POA: Diagnosis not present

## 2022-03-30 DIAGNOSIS — I25708 Atherosclerosis of coronary artery bypass graft(s), unspecified, with other forms of angina pectoris: Secondary | ICD-10-CM | POA: Diagnosis not present

## 2022-03-30 DIAGNOSIS — N401 Enlarged prostate with lower urinary tract symptoms: Secondary | ICD-10-CM | POA: Diagnosis not present

## 2022-03-30 LAB — MAGNESIUM: Magnesium: 2 mg/dL (ref 1.7–2.4)

## 2022-03-30 LAB — BASIC METABOLIC PANEL
Anion gap: 12 (ref 5–15)
BUN: 68 mg/dL — ABNORMAL HIGH (ref 8–23)
CO2: 31 mmol/L (ref 22–32)
Calcium: 8.9 mg/dL (ref 8.9–10.3)
Chloride: 90 mmol/L — ABNORMAL LOW (ref 98–111)
Creatinine, Ser: 2.24 mg/dL — ABNORMAL HIGH (ref 0.61–1.24)
GFR, Estimated: 28 mL/min — ABNORMAL LOW (ref >60)
Glucose, Bld: 267 mg/dL — ABNORMAL HIGH (ref 70–99)
Potassium: 3.8 mmol/L (ref 3.5–5.1)
Sodium: 133 mmol/L — ABNORMAL LOW (ref 135–145)

## 2022-03-30 LAB — GLUCOSE, CAPILLARY
Glucose-Capillary: 173 mg/dL — ABNORMAL HIGH (ref 70–99)
Glucose-Capillary: 185 mg/dL — ABNORMAL HIGH (ref 70–99)
Glucose-Capillary: 195 mg/dL — ABNORMAL HIGH (ref 70–99)
Glucose-Capillary: 284 mg/dL — ABNORMAL HIGH (ref 70–99)

## 2022-03-30 MED ORDER — TORSEMIDE 20 MG PO TABS: 20.0000 mg | ORAL_TABLET | ORAL | Status: DC

## 2022-03-30 MED ORDER — SENNOSIDES-DOCUSATE SODIUM 8.6-50 MG PO TABS: 1.0000 | ORAL_TABLET | Freq: Two times a day (BID) | ORAL | Status: DC

## 2022-03-30 MED ORDER — LANTUS SOLOSTAR 100 UNIT/ML ~~LOC~~ SOPN: 20.0000 [IU] | PEN_INJECTOR | Freq: Every day | SUBCUTANEOUS | Status: DC

## 2022-03-30 MED ORDER — CHLORHEXIDINE GLUCONATE 0.12 % MT SOLN
15.0000 mL | Freq: Two times a day (BID) | OROMUCOSAL | Status: DC
  Administered 2022-03-30 – 2022-03-31 (×2): 15 mL via OROMUCOSAL
  Filled 2022-03-30 (×2): qty 15

## 2022-03-30 MED ORDER — TORSEMIDE 20 MG PO TABS
40.0000 mg | ORAL_TABLET | ORAL | Status: DC
  Administered 2022-03-30: 40 mg via ORAL
  Filled 2022-03-30: qty 2

## 2022-03-30 MED ORDER — ORAL CARE MOUTH RINSE
15.0000 mL | Freq: Two times a day (BID) | OROMUCOSAL | Status: DC
  Administered 2022-03-31: 15 mL via OROMUCOSAL

## 2022-03-30 NOTE — Patient Outreach (Signed)
[facility]) Care Management  03/30/2022  John [name] [birth year] [id number]  Patient is admitted but discharge planned for tomorrow, Saturday. Relayed message to pt via [name], RN that NP will visit him on Monday, if he agrees.  Eulah Pont. Myrtie Neither, MSN, [facility] Gerontological Nurse Practitioner [facility] Care Management [phone]

## 2022-03-30 NOTE — Progress Notes (Signed)
PROGRESS NOTE    John [name]  YJE:[id number] DOB: 1941/04/14 DOA: 03/23/2022 PCP: [name], DO  81 y.o. male with a history of chronic 4L O2 dependence, COPD, asbestosis, combined HFrEF, PAF, CAD, T2DM, HTN, HLD, PAD, history of CVA, and stage IV CKD who presented to the ED 6/2 with shortness of breath, weight gain, and hypoxia.  He was noted to be volume overloaded, right heart cath this admission noted wedge pressure of 23, PA pressures of 59/23 consistent with volume overload, cardiology following, improving on diuretics   Subjective: -Denies any complaints, breathing continues to improve, ambulating in the halls  Assessment and Plan:  Acute on chronic HFrEF:  -LVEF 25-30% with anterolateral and apical hypokinesis.  -Cards following, right heart cath noted with pressure of 23, PA 59/23 -Diuresing on IV Lasix, urine output not accurate, weight down 9 pounds -GDMT limited by CKD -Diuretics held yesterday, restarting torsemide today, creatinine has improved -Continue hydralazine, Imdur, Toprol,  -Discharge planning, home tomorrow if stable   CAD, ischemic cardiomyopathy -Troponin mildly elevated on admission, no symptoms of ACS, felt to be demand ischemia in the setting of CHF, although EF is a little lower than baseline -Suspected to have multivessel CAD, not felt to be a good candidate for revascularization either way and ischemic work-up deferred in the setting of CKD -Continue Toprol, Plavix, Imdur and statin  Stage IV CKD -Creatinine improved back to baseline, starting torsemide today  Paroxysmal atrial fibrillation: Currently maintaining NSR - Continue metoprolol and low-dose Eliquis   Acute on chronic hypoxic respiratory failure: Due to acute cardiogenic pulmonary edema due to CHF decompensation, chronically related to COPD, asbestosis, OSA.  -On 4 L home O2 at baseline, wean O2 down to 4 as tolerated   COPD: No exacerbation currently.  - Continue bronchodilators.     OSA:  - Continue CPAP qHS.   BPH, urinary retention -Continue Flomax, check postvoid residual today    Hypokalemia: Improved  - Continue supplementation   T2DM: HbA1c was 7.1% in March 2023 indicating adequate-for-age glycemic control chronically.  -CBGs are stable, continue current dose of Levemir   Anemia of CKD and iron deficiency: Recent folic acid, O37 are replete. -Iron stores low on anemia panel, given IV iron this admission   Thrombocytopenia: Stable without bleeding.  - Monitor intermittently   PAD: No critical ischemia. Hx fem-pop bypass Aug 2019. - Continue plavix, statin, risk factor optimization.    Depression, anxiety:  - Continue home sertraline and alprazolam.    BPH: No retention currently noted.  - Continue tamsulosin   HLD:  - Continue statin.    GERD:  - Continue PPI     DVT prophylaxis: Eliquis Code Status: Full code Family Communication: Discussed patient and daughter at bedside Disposition Plan: Home likely tomorrow  Consultants: Cardiology   Procedures:   Antimicrobials:    Objective: Vitals:   03/30/22 0817 03/30/22 0900 03/30/22 0951 03/30/22 1132  BP: 117/64  (!) 123/100 123/66  Pulse: 72  73 70  Resp: 16   17  Temp:    (!) 97.5 F (36.4 C)  TempSrc:    Oral  SpO2: 100% 96%  94%  Weight:      Height:        Intake/Output Summary (Last 24 hours) at 03/30/2022 1136 Last data filed at 03/30/2022 0951 Gross per 24 hour  Intake 1790 ml  Output 2400 ml  Net -610 ml   Filed Weights   03/28/22 0347 03/29/22 0353 03/30/22 8588  Weight: 81.2 kg 80.1 kg 79.5 kg    Examination:  General exam: Pleasant elderly male sitting up in bed, AAOx3, no distress HEENT: No JVD CVS: S1-S2, regular rhythm Lungs: Clear bilaterally decreased at the bases Abdomen: Soft, obese, nontender, bowel sounds present Extremities: Trace edema  Skin: Chronic venous stasis changes Psychiatry:  Mood & affect appropriate.     Data Reviewed:    CBC: Recent Labs  Lab 03/24/22 0311 03/26/22 0315 03/26/22 0943 03/29/22 0253  WBC 9.1 7.9  --  6.6  HGB 8.4* 8.5* 9.2*  9.2* 8.3*  HCT 27.3* 26.9* 27.0*  27.0* 27.1*  MCV 91.6 91.2  --  93.4  PLT 127* 139*  --  102*   Basic Metabolic Panel: Recent Labs  Lab 03/24/22 0311 03/25/22 0402 03/26/22 0315 03/26/22 0943 03/27/22 0425 03/28/22 0410 03/29/22 0253 03/30/22 0047  NA 138   < > 136 138  139 137 138 136 133*  K 3.1*   < > 3.7 3.9  3.8 4.0 3.3* 4.1 3.8  CL 101   < > 99  --  100 95* 96* 90*  CO2 26   < > 29  --  27 31 28 31   GLUCOSE 74   < > 136*  --  187* 145* 186* 267*  BUN 57*   < > 58*  --  62* 62* 68* 68*  CREATININE 1.95*   < > 2.29*  --  2.26* 2.32* 2.49* 2.24*  CALCIUM 8.7*   < > 9.1  --  9.0 9.0 8.8* 8.9  MG 2.0  --  2.1  --   --   --   --  2.0   < > = values in this interval not displayed.   GFR: Estimated Creatinine Clearance: 25.3 mL/min (A) (by C-G formula based on SCr of 2.24 mg/dL (H)). Liver Function Tests: No results for input(s): "AST", "ALT", "ALKPHOS", "BILITOT", "PROT", "ALBUMIN" in the last 168 hours.  No results for input(s): "LIPASE", "AMYLASE" in the last 168 hours. No results for input(s): "AMMONIA" in the last 168 hours. Coagulation Profile: No results for input(s): "INR", "PROTIME" in the last 168 hours. Cardiac Enzymes: No results for input(s): "CKTOTAL", "CKMB", "CKMBINDEX", "TROPONINI" in the last 168 hours. BNP (last 3 results) No results for input(s): "PROBNP" in the last 8760 hours. HbA1C: No results for input(s): "HGBA1C" in the last 72 hours. CBG: Recent Labs  Lab 03/29/22 1110 03/29/22 1613 03/29/22 2118 03/30/22 0557 03/30/22 1134  GLUCAP 360* 148* 279* 173* 185*   Lipid Profile: No results for input(s): "CHOL", "HDL", "LDLCALC", "TRIG", "CHOLHDL", "LDLDIRECT" in the last 72 hours. Thyroid Function Tests: No results for input(s): "TSH", "T4TOTAL", "FREET4", "T3FREE", "THYROIDAB" in the last 72 hours. Anemia  Panel: No results for input(s): "VITAMINB12", "FOLATE", "FERRITIN", "TIBC", "IRON", "RETICCTPCT" in the last 72 hours. Urine analysis:    Component Value Date/Time   COLORURINE YELLOW 03/23/2022 [place] 03/23/2022 1323   LABSPEC 1.009 03/23/2022 1323   PHURINE 5.0 03/23/2022 1323   GLUCOSEU NEGATIVE 03/23/2022 1323   HGBUR NEGATIVE 03/23/2022 1323   BILIRUBINUR NEGATIVE 03/23/2022 1323   KETONESUR NEGATIVE 03/23/2022 1323   PROTEINUR NEGATIVE 03/23/2022 1323   NITRITE NEGATIVE 03/23/2022 1323   LEUKOCYTESUR NEGATIVE 03/23/2022 1323   Sepsis Labs: @LABRCNTIP (procalcitonin:4,lacticidven:4)  ) Recent Results (from the past 240 hour(s))  Resp Panel by RT-PCR (Flu A&B, Covid) Anterior Nasal Swab     Status: None   Collection Time: 03/23/22  7:56 AM  Specimen: Anterior Nasal Swab  Result Value Ref Range Status   SARS Coronavirus 2 by RT PCR NEGATIVE NEGATIVE Final    Comment: (NOTE) SARS-CoV-2 target nucleic acids are NOT DETECTED.  The SARS-CoV-2 RNA is generally detectable in upper respiratory specimens during the acute phase of infection. The lowest concentration of SARS-CoV-2 viral copies this assay can detect is 138 copies/mL. A negative result does not preclude SARS-Cov-2 infection and should not be used as the sole basis for treatment or other patient management decisions. A negative result may occur with  improper specimen collection/handling, submission of specimen other than nasopharyngeal swab, presence of viral mutation(s) within the areas targeted by this assay, and inadequate number of viral copies(<138 copies/mL). A negative result must be combined with clinical observations, patient history, and epidemiological information. The expected result is Negative.  Fact Sheet for Patients:  EntrepreneurPulse.com.au  Fact Sheet for Healthcare Providers:  IncredibleEmployment.be  This test is no t yet approved or  cleared by the [place] FDA and  has been authorized for detection and/or diagnosis of SARS-CoV-2 by FDA under an Emergency Use Authorization (EUA). This EUA will remain  in effect (meaning this test can be used) for the duration of the COVID-19 declaration under Section 564(b)(1) of the Act, 21 U.S.C.section 360bbb-3(b)(1), unless the authorization is terminated  or revoked sooner.       Influenza A by PCR NEGATIVE NEGATIVE Final   Influenza B by PCR NEGATIVE NEGATIVE Final    Comment: (NOTE) The Xpert Xpress SARS-CoV-2/FLU/RSV plus assay is intended as an aid in the diagnosis of influenza from Nasopharyngeal swab specimens and should not be used as a sole basis for treatment. Nasal washings and aspirates are unacceptable for Xpert Xpress SARS-CoV-2/FLU/RSV testing.  Fact Sheet for Patients: EntrepreneurPulse.com.au  Fact Sheet for Healthcare Providers: IncredibleEmployment.be  This test is not yet approved or cleared by the [place] FDA and has been authorized for detection and/or diagnosis of SARS-CoV-2 by FDA under an Emergency Use Authorization (EUA). This EUA will remain in effect (meaning this test can be used) for the duration of the COVID-19 declaration under Section 564(b)(1) of the Act, 21 U.S.C. section 360bbb-3(b)(1), unless the authorization is terminated or revoked.  Performed at [facility], [place] [address], [place], [place] [address]      Radiology Studies: DG Chest 2 View  Result Date: 03/29/2022 CLINICAL DATA:  Congestive heart failure EXAM: CHEST - 2 VIEW COMPARISON:  03/23/2022 FINDINGS: Chronic cardiomegaly. Coronary artery stents. Aortic atherosclerotic calcification. Pulmonary venous hypertension with interstitial and early alveolar edema. Bilateral effusions larger on the right than the left, associated with atelectasis. Findings slightly worsened since the prior exam. IMPRESSION: Persistent and  slightly worsened congestive heart failure as above. Electronically Signed   By: Nelson Chimes M.D.   On: 03/29/2022 11:33     Scheduled Meds:  allopurinol  300 mg Oral Daily   apixaban  2.5 mg Oral BID   clopidogrel  75 mg Oral Daily   ferrous sulfate  325 mg Oral BID WC   gabapentin  300 mg Oral QHS   hydrALAZINE  25 mg Oral BID   insulin aspart  0-6 Units Subcutaneous TID WC   insulin aspart  3 Units Subcutaneous TID WC   insulin detemir  15 Units Subcutaneous Daily   isosorbide dinitrate  20 mg Oral BID   metoprolol succinate  50 mg Oral Daily   mometasone-formoterol  2 puff Inhalation BID   pantoprazole  40  mg Oral Q breakfast   rosuvastatin  10 mg Oral QPM   sertraline  25 mg Oral QPM   sodium chloride flush  3 mL Intravenous Q12H   sodium chloride flush  3 mL Intravenous Q12H   sodium chloride flush  3 mL Intravenous Q12H   tamsulosin  0.4 mg Oral Daily   thiamine  100 mg Oral Daily   [START ON 03/31/2022] torsemide  20 mg Oral QODAY   torsemide  40 mg Oral QODAY   Continuous Infusions:  sodium chloride       LOS: 7 days    Time spent: 66min    Domenic Polite, MD [facility]   03/30/2022, 11:36 AM

## 2022-03-30 NOTE — Progress Notes (Signed)
Mobility Specialist Progress Note:   03/30/22 1406  Mobility  Activity Ambulated with assistance in hallway  Level of Assistance Standby assist, set-up cues, supervision of patient - no hands on  Assistive Device Four wheel walker  Distance Ambulated (ft) 550 ft  Activity Response Tolerated well  $Mobility charge 1 Mobility   Pt received EOB willing to participate in mobility. No complaints of pain. Pt required 3 seated rest breaks. Left in bed with call bell in reach and all needs met.     Mobility Specialist  

## 2022-03-30 NOTE — Progress Notes (Addendum)
Progress Note  Patient Name: John [name] Date of Encounter: 03/30/2022  [facility] Cardiologist: Kirk Ruths, MD   Subjective   Patient is overall doing well. He is on baseline O2. CXR showed persistent CHF. Kidney function improved this AM. No chest pain.   Inpatient Medications    Scheduled Meds:  allopurinol  300 mg Oral Daily   apixaban  2.5 mg Oral BID   clopidogrel  75 mg Oral Daily   ferrous sulfate  325 mg Oral BID WC   gabapentin  300 mg Oral QHS   hydrALAZINE  25 mg Oral BID   insulin aspart  0-6 Units Subcutaneous TID WC   insulin aspart  3 Units Subcutaneous TID WC   insulin detemir  15 Units Subcutaneous Daily   isosorbide dinitrate  20 mg Oral BID   metoprolol succinate  50 mg Oral Daily   mometasone-formoterol  2 puff Inhalation BID   pantoprazole  40 mg Oral Q breakfast   rosuvastatin  10 mg Oral QPM   sertraline  25 mg Oral QPM   sodium chloride flush  3 mL Intravenous Q12H   sodium chloride flush  3 mL Intravenous Q12H   sodium chloride flush  3 mL Intravenous Q12H   tamsulosin  0.4 mg Oral Daily   thiamine  100 mg Oral Daily   Continuous Infusions:  sodium chloride     PRN Meds: sodium chloride, acetaminophen **OR** acetaminophen, albuterol, ALPRAZolam, calcium carbonate, morphine injection, ondansetron (ZOFRAN) IV, sodium chloride flush   Vital Signs    Vitals:   03/30/22 0428 03/30/22 0817 03/30/22 0900 03/30/22 0951  BP: 116/65 117/64  (!) 123/100  Pulse: 68 72  73  Resp: 18 16    Temp: 97.7 F (36.5 C)     TempSrc: Oral     SpO2: 97% 100% 96%   Weight: 79.5 kg     Height:        Intake/Output Summary (Last 24 hours) at 03/30/2022 1007 Last data filed at 03/30/2022 0951 Gross per 24 hour  Intake 1790 ml  Output 2800 ml  Net -1010 ml      03/30/2022    4:28 AM 03/29/2022    3:53 AM 03/28/2022    3:47 AM  Last 3 Weights  Weight (lbs) 175 lb 4.8 oz 176 lb 9.6 oz 179 lb 1.6 oz  Weight (kg) 79.516 kg 80.105 kg 81.239 kg       Telemetry    NSR, PVCs, vetn bigeminy. HR 60s - Personally Reviewed  ECG    No new - Personally Reviewed  Physical Exam   GEN: No acute distress.   Neck: No JVD Cardiac: RRR, no murmurs, rubs, or gallops.  Respiratory: diminished breath sounds at bases. GI: Soft, nontender, non-distended  MS: No edema; No deformity. Neuro:  Nonfocal  Psych: Normal affect   Labs    High Sensitivity Troponin:   Recent Labs  Lab 03/23/22 0737 03/23/22 0920  TROPONINIHS 112* 168*     Chemistry Recent Labs  Lab 03/24/22 0311 03/25/22 0402 03/26/22 0315 03/26/22 0943 03/28/22 0410 03/29/22 0253 03/30/22 0047  NA 138   < > 136   < > 138 136 133*  K 3.1*   < > 3.7   < > 3.3* 4.1 3.8  CL 101   < > 99   < > 95* 96* 90*  CO2 26   < > 29   < > 31 28 31   GLUCOSE 74   < >  136*   < > 145* 186* 267*  BUN 57*   < > 58*   < > 62* 68* 68*  CREATININE 1.95*   < > 2.29*   < > 2.32* 2.49* 2.24*  CALCIUM 8.7*   < > 9.1   < > 9.0 8.8* 8.9  MG 2.0  --  2.1  --   --   --  2.0  GFRNONAA 34*   < > 28*   < > 27* 25* 28*  ANIONGAP 11   < > 8   < > 12 12 12    < > = values in this interval not displayed.    Lipids No results for input(s): "CHOL", "TRIG", "HDL", "LABVLDL", "LDLCALC", "CHOLHDL" in the last 168 hours.  Hematology Recent Labs  Lab 03/24/22 0311 03/26/22 0315 03/26/22 0943 03/29/22 0253  WBC 9.1 7.9  --  6.6  RBC 2.98* 2.95*  --  2.90*  HGB 8.4* 8.5* 9.2*  9.2* 8.3*  HCT 27.3* 26.9* 27.0*  27.0* 27.1*  MCV 91.6 91.2  --  93.4  MCH 28.2 28.8  --  28.6  MCHC 30.8 31.6  --  30.6  RDW 18.2* 18.7*  --  18.6*  PLT 127* 139*  --  146*   Thyroid No results for input(s): "TSH", "FREET4" in the last 168 hours.  BNP Recent Labs  Lab 03/26/22 0315 03/29/22 0253  BNP 795.0* 492.2*    DDimer No results for input(s): "DDIMER" in the last 168 hours.   Radiology    DG Chest 2 View  Result Date: 03/29/2022 CLINICAL DATA:  Congestive heart failure EXAM: CHEST - 2 VIEW COMPARISON:   03/23/2022 FINDINGS: Chronic cardiomegaly. Coronary artery stents. Aortic atherosclerotic calcification. Pulmonary venous hypertension with interstitial and early alveolar edema. Bilateral effusions larger on the right than the left, associated with atelectasis. Findings slightly worsened since the prior exam. IMPRESSION: Persistent and slightly worsened congestive heart failure as above. Electronically Signed   By: Nelson Chimes M.D.   On: 03/29/2022 11:33    Cardiac Studies   Echocardiogram (limited) 03/08/22 1. Limited study.   2. Poor acoustic windows.   3. LVEF is severely depressed with hypokinesis worse in the lateral,  anterior and apical walls . Left ventricular ejection fraction, by  estimation, is 25 to 30%. The left ventricle has severely decreased  function. The left ventricular internal cavity  size was moderately dilated. There is mild left ventricular hypertrophy.   4. Right ventricular systolic function is normal. The right ventricular  size is normal.   5. The inferior vena cava is dilated in size with <50% respiratory  variability, suggesting right atrial pressure of 15 mmHg.   Patient Profile     81 y.o. 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 CXR showed persistent worsening CHF - Hydralazine 25mg  BID, Imdur 20mg  BID and Toprol XL 50mg  daily - no ACE/ARB/ARNI/MRA due to CKD - kidney function is better today, can likely restart oral lasix. PTA torsemide 40mg  daily  CAD/ICM - Recent  echo 03/08/22 showed LVEF 25-30%. He  had a NSTEMI earlier this year - H/o CAD with DES x2 LAD in 2013 FE 40% and NSTEMI 12/2021 that was medically managed due to AKI EF 35% - no angina reported - mild troponin elevation this admission - no plan for further ischemic evaluation given CKD and multiple comorbidities - continue Plavix 75mg  daily, ToprolXL 50mg  adiy, Imdur 20mg  BID, and statin therapy - NO ASA with eliquis  Paroxysmal Afib - in NSR  - Eliquis 2.5mg  BID (reduced age and renal function)  CKD stage 4 - Baseline around 2-2.2 - Today 2.24 - monitor with diuresis  Chronic respiratory sufficiency with hypoxia - he is on baseline 3-4LO2   For questions or updates, please contact [facility] Please consult [email or web address] for contact info under        Signed, Chirstine Defrain Ninfa Meeker, PA-C  03/30/2022, 10:07 AM

## 2022-03-30 NOTE — Consult Note (Signed)
   [facility] CM Inpatient Consult   03/30/2022  Tane Choy [birth year] [id number]  Follow up: Active  Met with the patient at the bedside.  Patient is active with [facility] NP-G and given follow up card for appointment and 24 hour nurse advise line.  Patient was generous and states all SDOH for medication, food and transportation is in place without current needs.  Explained to patient NP-G will follow up on Monday if home over the weekend.  Patient expressed gratitude and acceptance of plan.  For questions,  Charlesetta Shanks, RN BSN CCM [facility]  [phone] business mobile phone Toll free office [phone]  Fax number: [phone] [email or web address]

## 2022-03-30 NOTE — Plan of Care (Signed)
  Problem: Education: Goal: Ability to verbalize understanding of medication therapies will improve Outcome: Progressing Goal: Individualized Educational Video(s) Outcome: Progressing   Problem: Health Behavior/Discharge Planning: Goal: Ability to identify and utilize available resources and services will improve Outcome: Progressing Goal: Ability to manage health-related needs will improve Outcome: Progressing   Problem: Nutritional: Goal: Maintenance of adequate nutrition will improve Outcome: Progressing

## 2022-03-30 NOTE — Progress Notes (Signed)
Heart Failure Stewardship Pharmacist Progress Note   PCP: [name], DO PCP-Cardiologist: Kirk Ruths, MD    HPI:  81 yo M with PMH of CHF, afib, CAD, HTN, HLD, PAD, CVA, chronic respiratory failure on 4L, T2DM, COPD, and CKD IV.   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 However, in June 2013, EF was 40% with ischemia on LHC - PCI x 2 to LAD. He had an ECHO done in 12/2020 and LVEF was 50-55%. It has since gradually reduced.  He presented to the ED on 6/2 with shortness of breath, orthopnea, and chest tightness. Also endorses abdominal edema. CXR with cardiomegaly and pulmonary edema. His last ECHO was on 5/18 and LVEF was 25-30% with mild LVH. RHC on 6/5 with elevated filling pressures - RA 9, PA 41, wedge 23, CO 5.3, CI 2.7. IV diuresis continued. Now have been transitioned to oral.  Current HF Medications: Diuretic: torsemide 20/40 mg alternative days Beta Blocker: metoprolol XL 50 mg daily Other: hydralazine 25 mg BID; Isordil 20 mg BID  Prior to admission HF Medications: Diuretic: torsemide 40 mg BID Beta blocker: metoprolol XL 50 mg daily Other: hydralazine 25 mg BID + Isordil 20 mg BID  Pertinent Lab Values: Serum creatinine 2.24, BUN 68, Potassium 3.8, Sodium 133, BNP 795, Magnesium 2.0, A1c 7.1 (12/24/21)  TSAT 7, Ferritin 81 (5/23)  Vital Signs: Weight: 176 lbs (admission weight: 182 lbs) Blood pressure: 110-120/60s  Heart rate: 60-70s  I/O: -3.1L yesterday; net -3.5L  Medication Assistance / Insurance Benefits Check: Does the patient have prescription insurance?  Yes Type of insurance plan: [place] Medicare  Outpatient Pharmacy:  Prior to admission outpatient pharmacy: [facility] Is the patient willing to use [facility] at discharge? Yes Is the patient willing to transition their outpatient pharmacy to utilize a [facility] outpatient pharmacy?   Pending    Assessment: 1. Acute on chronic systolic CHF (LVEF 74-14%), due to ICM. NYHA class III  symptoms. - Agree with transitioning to torsemide 20/40 mg alternating days - Continue metoprolol XL 50 mg daily - No ACE/ARB/ARNI, spironolactone, or SGLT2i with AKI on CKD - Continue hydralazine 25 mg BID + Isordil 20 mg BID   Plan: 1) Medication changes recommended at this time: - Agree with changes  2) Patient assistance: Delene Loll copay $195 - Farxiga copay $40  3)  Education  - Patient has been educated on current HF medications and potential additions to HF medication regimen - Patient verbalizes understanding that over the next few months, these medication doses may change and more medications may be added to optimize HF regimen - Patient has been educated on basic disease state pathophysiology and goals of therapy   Kerby Nora, PharmD, BCPS Heart Failure Stewardship Pharmacist Phone [phone]

## 2022-03-30 NOTE — Progress Notes (Signed)
Physical Therapy Treatment Patient Details Name: John [name] MRN: [id number] DOB: [birth year] Today's Date: 03/30/2022   History of Present Illness The pt is an 81 yo male presenting 6/2 with SOB. Pt with mild troponin elevation, found to have acute on chronic CHF exacerbation. S/p RHC 6/5. PMH includes: NSTEMI (12/24/21), CHF, COPD, OSA on CPAP, HTN, HLD, PVD s/p bypass, CAD s/p multiple stents, CAS s/p carotid stents, DMII, CKD IV, BPH, prior tobacco abuse, and obesity. Of note, pt with 3 admissions for CHF exacerbation since 01/09/22.    PT Comments    The pt was able to demo good continued progress with mobility and endurance, to include stair navigation with single rail (to mimic home environment). The pt maintained SpO2 > 94% on 3L with all activity. Challenged after ambulation by 60 sec sit-stand test. Pt completed 14 sit-stands total, 10 in first 30 seconds. Pt with continued deficits in LE strength (especially RLE), and power, will continue to benefit from HHPT after d/c to maximize endurance and independence.   Gait Speed: 0.78m/s using rollator and with 3L O2. (Gait speed < 1.57m/s indicates increased risk of falls)  60 Second Sit-Stand: The patient completed 10 sit-stands in a 30 second period and 14 sit-stands in a 60 second period. ( < 12 sit-stands in 30 seconds for individuals aged 24-89 indicates below average activity tolerance and increased risk of falls, completing greater reps in first 30 seconds compared to last 30 seconds of test also indicates poor muscular endurance)     Recommendations for follow up therapy are one component of a multi-disciplinary discharge planning process, led by the attending physician.  Recommendations may be updated based on patient status, additional functional criteria and insurance authorization.  Follow Up Recommendations  Home health PT     Assistance Recommended at Discharge PRN  Patient can return home with the following A little help with  walking and/or transfers;Help with stairs or ramp for entrance;Assist for transportation;A little help with bathing/dressing/bathroom;Assistance with cooking/housework   Equipment Recommendations  None recommended by PT    Recommendations for Other Services       Precautions / Restrictions Precautions Precautions: Fall Precaution Comments: watch O2, on 3L this session Restrictions Weight Bearing Restrictions: No     Mobility  Bed Mobility Overal bed mobility: Independent             General bed mobility comments: ambulating in room upon arrival    Transfers Overall transfer level: Modified independent Equipment used: Rollator (4 wheels)               General transfer comment: ambulating in room upon arrival, able to complete without UE support. 10 in 30 sec, 14 in full min    Ambulation/Gait Ambulation/Gait assistance: Supervision Gait Distance (Feet): 150 Feet (+ 100 ft + 50 ft) Assistive device: Rollator (4 wheels) Gait Pattern/deviations: Step-through pattern, Decreased stride length Gait velocity: 0.63 m/s Gait velocity interpretation: 1.31 - 2.62 ft/sec, indicative of limited community ambulator   General Gait Details: pt with slow but generally steady steps, mildly increased forward lean. able to identify need for seated rest. SpO2 to low of 87% on 4L after ambulation, recovered to 94% after 3-4 seated breaths   Stairs Stairs: Yes Stairs assistance: Min guard Stair Management: One rail Left, Step to pattern, Forwards Number of Stairs: 3 General stair comments: minG for safety, alternating ascending, step-to descending with pt reporting RLE weaker, encouraged to complete with RLE leading descending     Balance  Overall balance assessment: Needs assistance Sitting-balance support: Feet supported, No upper extremity supported Sitting balance-Leahy Scale: Good     Standing balance support: Bilateral upper extremity supported, During functional  activity Standing balance-Leahy Scale: Fair Standing balance comment: static stand without UE support, BUE support for gait                            Cognition Arousal/Alertness: Awake/alert Behavior During Therapy: WFL for tasks assessed/performed Overall Cognitive Status: Within Functional Limits for tasks assessed                                 General Comments: pt able to answer all questions appropriately and demos good safety awarness        Exercises Other Exercises Other Exercises: sit-stand for 60 sec: 14 total (10 in first 30 sec), limited by R knee pain    General Comments General comments (skin integrity, edema, etc.): VSS on 3L      Pertinent Vitals/Pain Pain Assessment Pain Assessment: No/denies pain Pain Intervention(s): Monitored during session     PT Goals (current goals can now be found in the care plan section) Acute Rehab PT Goals Patient Stated Goal: return home PT Goal Formulation: With patient Time For Goal Achievement: 04/09/22 Potential to Achieve Goals: Good Progress towards PT goals: Progressing toward goals    Frequency    Min 3X/week      PT Plan Current plan remains appropriate       AM-PAC PT "6 Clicks" Mobility   Outcome Measure  Help needed turning from your back to your side while in a flat bed without using bedrails?: None Help needed moving from lying on your back to sitting on the side of a flat bed without using bedrails?: None Help needed moving to and from a bed to a chair (including a wheelchair)?: None Help needed standing up from a chair using your arms (e.g., wheelchair or bedside chair)?: None Help needed to walk in hospital room?: A Little Help needed climbing 3-5 steps with a railing? : A Little 6 Click Score: 22    End of Session Equipment Utilized During Treatment: Gait belt;Oxygen Activity Tolerance: Patient tolerated treatment well;Patient limited by fatigue;No increased  pain Patient left: in bed;with call bell/phone within reach Nurse Communication: Mobility status PT Visit Diagnosis: Unsteadiness on feet (R26.81);Other abnormalities of gait and mobility (R26.89);Muscle weakness (generalized) (M62.81)     Time: 2263-3354 PT Time Calculation (min) (ACUTE ONLY): 21 min  Charges:  $Therapeutic Exercise: 8-22 mins                     West Carbo, PT, DPT   Acute Rehabilitation Department   Sandra Cockayne 03/30/2022, 5:45 PM

## 2022-03-30 NOTE — Care Management Important Message (Signed)
Important Message  Patient Details  Name: John [name] MRN: [id number] Date of Birth: Aug 22, 1941   Medicare Important Message Given:  Yes     [name] 03/30/2022, 8:09 AM

## 2022-03-31 DIAGNOSIS — I5043 Acute on chronic combined systolic (congestive) and diastolic (congestive) heart failure: Secondary | ICD-10-CM | POA: Diagnosis not present

## 2022-03-31 LAB — CBC
HCT: 29.7 % — ABNORMAL LOW (ref 39.0–52.0)
Hemoglobin: 9.1 g/dL — ABNORMAL LOW (ref 13.0–17.0)
MCH: 28.3 pg (ref 26.0–34.0)
MCHC: 30.6 g/dL (ref 30.0–36.0)
MCV: 92.2 fL (ref 80.0–100.0)
Platelets: 200 10*3/uL (ref 150–400)
RBC: 3.22 MIL/uL — ABNORMAL LOW (ref 4.22–5.81)
RDW: 18.6 % — ABNORMAL HIGH (ref 11.5–15.5)
WBC: 6 10*3/uL (ref 4.0–10.5)
nRBC: 0.3 % — ABNORMAL HIGH (ref 0.0–0.2)

## 2022-03-31 LAB — GLUCOSE, CAPILLARY
Glucose-Capillary: 195 mg/dL — ABNORMAL HIGH (ref 70–99)
Glucose-Capillary: 210 mg/dL — ABNORMAL HIGH (ref 70–99)

## 2022-03-31 LAB — BASIC METABOLIC PANEL
Anion gap: 9 (ref 5–15)
BUN: 69 mg/dL — ABNORMAL HIGH (ref 8–23)
CO2: 34 mmol/L — ABNORMAL HIGH (ref 22–32)
Calcium: 9.1 mg/dL (ref 8.9–10.3)
Chloride: 89 mmol/L — ABNORMAL LOW (ref 98–111)
Creatinine, Ser: 2.25 mg/dL — ABNORMAL HIGH (ref 0.61–1.24)
GFR, Estimated: 28 mL/min — ABNORMAL LOW (ref >60)
Glucose, Bld: 245 mg/dL — ABNORMAL HIGH (ref 70–99)
Potassium: 3.8 mmol/L (ref 3.5–5.1)
Sodium: 132 mmol/L — ABNORMAL LOW (ref 135–145)

## 2022-03-31 MED ORDER — TORSEMIDE 20 MG PO TABS: 40.0000 mg | ORAL_TABLET | Freq: Every day | ORAL | 0 refills | Status: DC

## 2022-03-31 MED ORDER — TORSEMIDE 20 MG PO TABS
40.0000 mg | ORAL_TABLET | Freq: Every day | ORAL | Status: DC
  Administered 2022-03-31: 40 mg via ORAL
  Filled 2022-03-31: qty 2

## 2022-03-31 NOTE — Progress Notes (Signed)
Progress Note  Patient Name: John [name] Date of Encounter: 03/31/2022  [facility] Cardiologist: Kirk Ruths, MD   Subjective   Pt denies CP or dyspnea  Inpatient Medications    Scheduled Meds:  allopurinol  300 mg Oral Daily   apixaban  2.5 mg Oral BID   chlorhexidine  15 mL Mouth Rinse BID   clopidogrel  75 mg Oral Daily   ferrous sulfate  325 mg Oral BID WC   gabapentin  300 mg Oral QHS   hydrALAZINE  25 mg Oral BID   insulin aspart  0-6 Units Subcutaneous TID WC   insulin aspart  3 Units Subcutaneous TID WC   insulin detemir  15 Units Subcutaneous Daily   isosorbide dinitrate  20 mg Oral BID   mouth rinse  15 mL Mouth Rinse q12n4p   metoprolol succinate  50 mg Oral Daily   mometasone-formoterol  2 puff Inhalation BID   pantoprazole  40 mg Oral Q breakfast   rosuvastatin  10 mg Oral QPM   sertraline  25 mg Oral QPM   sodium chloride flush  3 mL Intravenous Q12H   tamsulosin  0.4 mg Oral Daily   thiamine  100 mg Oral Daily   torsemide  20 mg Oral QODAY   torsemide  40 mg Oral QODAY   Continuous Infusions:  sodium chloride     PRN Meds: sodium chloride, acetaminophen **OR** acetaminophen, albuterol, ALPRAZolam, calcium carbonate, morphine injection, ondansetron (ZOFRAN) IV, sodium chloride flush   Vital Signs    Vitals:   03/30/22 1925 03/30/22 2320 03/31/22 0200 03/31/22 0206  BP: 124/64   121/67  Pulse: 71 65    Resp:  20  20  Temp: 97.6 F (36.4 C)     TempSrc: Oral     SpO2: 100% 98%  100%  Weight:   80.1 kg   Height:        Intake/Output Summary (Last 24 hours) at 03/31/2022 0625 Last data filed at 03/31/2022 0200 Gross per 24 hour  Intake 1551 ml  Output 1575 ml  Net -24 ml      03/31/2022    2:00 AM 03/30/2022    4:28 AM 03/29/2022    3:53 AM  Last 3 Weights  Weight (lbs) 176 lb 9.6 oz 175 lb 4.8 oz 176 lb 9.6 oz  Weight (kg) 80.105 kg 79.516 kg 80.105 kg      Telemetry    Sinus with PVCs- Personally Reviewed   Physical Exam    GEN: No acute distress.   Neck: No JVD Cardiac: RRR, no murmurs, rubs, or gallops.  Respiratory: Diminished BS RLL GI: Soft, nontender, non-distended  MS: No edema Neuro:  Nonfocal  Psych: Normal affect   Labs    High Sensitivity Troponin:   Recent Labs  Lab 03/23/22 0737 03/23/22 0920  TROPONINIHS 112* 168*     Chemistry Recent Labs  Lab 03/26/22 0315 03/26/22 0943 03/29/22 0253 03/30/22 0047 03/31/22 0206  NA 136   < > 136 133* 132*  K 3.7   < > 4.1 3.8 3.8  CL 99   < > 96* 90* 89*  CO2 29   < > 28 31 34*  GLUCOSE 136*   < > 186* 267* 245*  BUN 58*   < > 68* 68* 69*  CREATININE 2.29*   < > 2.49* 2.24* 2.25*  CALCIUM 9.1   < > 8.8* 8.9 9.1  MG 2.1  --   --  2.0  --  GFRNONAA 28*   < > 25* 28* 28*  ANIONGAP 8   < > 12 12 9    < > = values in this interval not displayed.     Hematology Recent Labs  Lab 03/26/22 0315 03/26/22 0943 03/29/22 0253 03/31/22 0206  WBC 7.9  --  6.6 6.0  RBC 2.95*  --  2.90* 3.22*  HGB 8.5* 9.2*  9.2* 8.3* 9.1*  HCT 26.9* 27.0*  27.0* 27.1* 29.7*  MCV 91.2  --  93.4 92.2  MCH 28.8  --  28.6 28.3  MCHC 31.6  --  30.6 30.6  RDW 18.7*  --  18.6* 18.6*  PLT 139*  --  146* 200    BNP Recent Labs  Lab 03/26/22 0315 03/29/22 0253  BNP 795.0* 492.2*      Radiology    DG Chest 2 View  Result Date: 03/29/2022 CLINICAL DATA:  Congestive heart failure EXAM: CHEST - 2 VIEW COMPARISON:  03/23/2022 FINDINGS: Chronic cardiomegaly. Coronary artery stents. Aortic atherosclerotic calcification. Pulmonary venous hypertension with interstitial and early alveolar edema. Bilateral effusions larger on the right than the left, associated with atelectasis. Findings slightly worsened since the prior exam. IMPRESSION: Persistent and slightly worsened congestive heart failure as above. Electronically Signed   By: Nelson Chimes M.D.   On: 03/29/2022 11:33      Patient Profile     81 y.o. male with past medical history of coronary artery  disease, ischemic cardiomyopathy, chronic systolic congestive heart failure, paroxysmal atrial fibrillation, chronic stage IV kidney disease, hypertension, hyperlipidemia, peripheral vascular disease admitted with acute on chronic systolic congestive heart failure.  Last echocardiogram May 2023 showed ejection fraction 25 to 30%, moderate left ventricular enlargement, mild left ventricular hypertrophy.  Right heart catheterization June 2023 showed right atrial pressure of 14, pulmonary capillary wedge pressure of 23, PA pressure 59/23.  Assessment & Plan    1 acute on chronic systolic congestive heart failure-patient appears to be euvolemic at present.  We will change torsemide to 40 mg daily.  He will take an extra 20 mg of Demadex daily for weight gain of 2 pounds.  We discussed the importance of fluid restriction and low-sodium diet.  Will not add Farxiga or spironolactone due to baseline renal insufficiency.  2 ischemic cardiomyopathy-continue hydralazine/nitrates and Toprol.  He is not on an ARB or Entresto due to severity of renal insufficiency.  3 coronary artery disease-continue Plavix and statin.  We have elected not to pursue cardiac catheterization due to risk of contrast nephropathy (given baseline renal insufficiency).  4 chronic stage IV kidney disease-we will need close follow-up as an outpatient.  5 paroxysmal atrial fibrillation-patient remains in sinus rhythm on telemetry.  Continue Toprol and apixaban.  6 hypertension-patient's blood pressure is controlled.  Continue present medical regimen.  7 hyperlipidemia-continue statin.  8 COPD/chronic respiratory failure-on home O2.  Patient can be discharged from a cardiac standpoint.  Plan as outlined above.  We will check bmet 1 week following discharge.  We will arrange follow-up with APP 2 to 4 weeks following discharge.  Follow-up with me in 3 months.  Please call with questions.  For questions or updates, please contact [place] Please consult [email or web address] for contact info under        Signed, Kirk Ruths, MD  03/31/2022, 6:25 AM

## 2022-03-31 NOTE — TOC Transition Note (Signed)
Transition of Care [facility]) - CM/SW Discharge Note   Patient Details  Name: John [name] MRN: [id number] Date of Birth: [birth year]  Transition of Care [facility]) CM/SW Contact:  Carles Collet, RN Phone Number: 03/31/2022, 9:46 AM   Clinical Narrative:   patient active with [facility] for [facility] services. Placed resumption of care orders for patient and notified [facility] of DC.      Final next level of care: [place] Barriers to Discharge: No Barriers Identified   Patient Goals and CMS Choice Patient states their goals for this hospitalization and ongoing recovery are:: return home with [facility] CMS Medicare.gov Compare Post Acute Care list provided to:: Patient Choice offered to / list presented to : Patient  Discharge Placement                       Discharge Plan and Services   Discharge Planning Services: CM Consult Post Acute Care Choice: Home Health            DME Agency: NA       HH Arranged: PT, OT HH Agency: [place] Date [place]: 03/31/22 Time [place]: [phone] Representative spoke with at [place]: [place] Determinants of Health ([place]) Interventions Food Insecurity Interventions: Intervention Not Indicated Housing Interventions: Intervention Not Indicated Transportation Interventions: Intervention Not Indicated   Readmission Risk Interventions    03/26/2022    3:15 PM 03/08/2022   11:55 AM 01/29/2022   10:39 AM  Readmission Risk Prevention Plan  Transportation Screening Complete Complete Complete  Medication Review Press photographer) Complete Complete Complete  PCP or Specialist appointment within 3-5 days of discharge Complete    HRI or [place] Complete Complete Complete  SW Recovery Care/Counseling Consult Complete Complete Complete  Palliative Care Screening Complete Not Applicable Not [place] Not Applicable Not Applicable Not Applicable

## 2022-03-31 NOTE — Discharge Summary (Signed)
Physician Discharge Summary  John [name] YIR:[id number] DOB: Jun 13, 1941 DOA: 03/23/2022  PCP: [name], DO  Admit date: 03/23/2022 Discharge date: 03/31/2022  Time spent: 45 minutes  Recommendations for Outpatient Follow-up:  Heart failure TOC clinic in 10 days [facility] In 3 to 4 weeks and Dr. [name] in 3 months Please check BMP at follow-up Home health PT OT PCP in 1 to 2 weeks, consider CODE STATUS and goals of care discussions as well   Discharge Diagnoses:  Principal Problem:   Acute on chronic combined systolic and diastolic CHF (congestive heart failure) ([place]) Active Problems:   Essential hypertension   Paroxysmal atrial fibrillation (HCC)   CAD (coronary artery disease) of artery bypass graft   CKD (chronic kidney disease) stage 4, GFR 15-29 ml/min (HCC)   Type 2 diabetes mellitus with hyperlipidemia (HCC)   Thrombocytopenia (HCC)   Peripheral vascular disease (HCC)   BPH (benign prostatic hyperplasia)   Depression   Discharge Condition: Stable  Diet recommendation: Low-sodium, heart healthy, diabetic  Filed Weights   03/29/22 0353 03/30/22 0428 03/31/22 0200  Weight: 80.1 kg 79.5 kg 80.1 kg    History of present illness:  81 y.o. male with a history of chronic 4L O2 dependence, COPD, asbestosis, combined HFrEF, PAF, CAD, T2DM, HTN, HLD, PAD, history of CVA, and stage IV CKD who presented to the ED 6/2 with shortness of breath, weight gain, and hypoxia.  He was noted to be volume overloaded, right heart cath this admission noted wedge pressure of 23, PA pressures of 59/23 consistent with volume overload  Hospital Course:   Acute on chronic HFrEF:  -LVEF 25-30% with anterolateral and apical hypokinesis.  -Cards following, right heart cath noted with pressure of 23, PA 59/23 -Diuresed on IV Lasix, urine output not accurate, weight down 11 pounds -GDMT limited by CKD -Clinically felt to be euvolemic now transition to torsemide 40 Mg daily, advised to  take an extra 20 Mg of torsemide for weight gain, importance of fluid restriction and low-salt diet discussed -Continue hydralazine, Imdur, Toprol,  -Discharged home in a stable condition, he has a follow-up with the heart failure TOC clinic as well as cardiology APP in 3 weeks   CAD, ischemic cardiomyopathy -Troponin mildly elevated on admission, no symptoms of ACS, felt to be demand ischemia in the setting of CHF, although EF is a little lower than baseline-35% -Suspected to have multivessel CAD, not felt to be a good candidate for revascularization either way and ischemic work-up deferred in the setting of CKD -Continue Toprol, Plavix, Imdur and statin   Stage IV CKD -Creatinine improved back to baseline, restarted torsemide, baseline creatinine is 2.2   Paroxysmal atrial fibrillation: Currently maintaining NSR - Continue metoprolol and low-dose Eliquis   Acute on chronic hypoxic respiratory failure: Due to acute cardiogenic pulmonary edema due to CHF decompensation, chronically related to COPD, asbestosis, OSA.  -On 4 L home O2 at baseline, wean O2 down to 4 as tolerated   COPD: No exacerbation currently.  - Continue bronchodilators.    OSA:  - Continue CPAP qHS.    BPH,  -No retention noted this admission, continue Flomax    Hypokalemia: Improved    T2DM: HbA1c was 7.1% in March 2023 indicating adequate-for-age glycemic control chronically.  -CBGs are stable, continue current dose of Levemir   Anemia of CKD and iron deficiency: Recent folic acid, J00 are replete. -Iron stores low on anemia panel, given IV iron this admission   Thrombocytopenia: Stable without  bleeding.  - Monitor intermittently   PAD: No critical ischemia. Hx fem-pop bypass Aug 2019. - Continue plavix, statin, risk factor optimization.    Depression, anxiety:  - Continue home sertraline and alprazolam.   HLD:  - Continue statin.    GERD:  - Continue PPI     Consultants:  Cardiology   Discharge Exam: Vitals:   03/31/22 0713 03/31/22 0944  BP:  126/86  Pulse:  70  Resp:    Temp:  98.1 F (36.7 C)  SpO2: 98% 99%   General exam: Pleasant elderly male sitting up in bed, AAOx3, no distress HEENT: No JVD CVS: S1-S2, regular rhythm Lungs: Clear bilaterally decreased at the bases Abdomen: Soft, obese, nontender, bowel sounds present Extremities: Trace edema  Skin: Chronic venous stasis changes Psychiatry:  Mood & affect appropriate.    Discharge Instructions   Discharge Instructions     Diet - low sodium heart healthy   Complete by: As directed    Diet Carb Modified   Complete by: As directed    Increase activity slowly   Complete by: As directed       Allergies as of 03/31/2022       Reactions   Brilinta [ticagrelor] Shortness Of Breath   Penicillins Hives   Ezetimibe-simvastatin Other (See Comments)   Myalgia        Medication List     STOP taking these medications    insulin aspart 100 UNIT/ML injection Commonly known as: novoLOG       TAKE these medications    albuterol 108 (90 Base) MCG/ACT inhaler Commonly known as: VENTOLIN HFA Inhale 2 puffs into the lungs every 6 (six) hours as needed for wheezing or shortness of breath.   allopurinol 300 MG tablet Commonly known as: ZYLOPRIM Take 300 mg by mouth daily.   ALPRAZolam 0.25 MG tablet Commonly known as: XANAX Take 1 tablet (0.25 mg total) by mouth 2 (two) times daily as needed for anxiety.   apixaban 2.5 MG Tabs tablet Commonly known as: ELIQUIS Take 1 tablet (2.5 mg total) by mouth 2 (two) times daily.   BD Pen Needle Nano U/F 32G X 4 MM Misc Generic drug: Insulin Pen Needle Use to inject insulin up to 4 times daily.   cholecalciferol 25 MCG (1000 UNIT) tablet Commonly known as: VITAMIN D3 Take 1,000 Units by mouth daily.   clopidogrel 75 MG tablet Commonly known as: PLAVIX Take 1 tablet (75 mg total) by mouth daily.   ferrous sulfate 325 (65 FE) MG  tablet Take 1 tablet (325 mg total) by mouth 2 (two) times daily with a meal.   folic acid 1 MG tablet Commonly known as: FOLVITE Take 1 tablet (1 mg total) by mouth daily.   gabapentin 300 MG capsule Commonly known as: NEURONTIN Take 1 capsule (300 mg total) by mouth at bedtime.   hydrALAZINE 25 MG tablet Commonly known as: APRESOLINE Take 1 tablet (25 mg total) by mouth 2 (two) times daily.   ipratropium-albuterol 0.5-2.5 (3) MG/3ML Soln Commonly known as: DUONEB Take 3 mLs by nebulization 3 (three) times daily.   isosorbide dinitrate 20 MG tablet Commonly known as: ISORDIL Take 1 tablet (20 mg total) by mouth 2 (two) times daily.   Lantus SoloStar 100 UNIT/ML Solostar Pen Generic drug: insulin glargine Inject 20 Units into the skin daily. Dose per sliding scale. What changed: how much to take   metoprolol succinate 50 MG 24 hr tablet Commonly known as: TOPROL-XL Take 1  tablet (50 mg total) by mouth daily. Take with or immediately following a meal.   mometasone-formoterol 200-5 MCG/ACT Aero Commonly known as: DULERA Inhale 2 puffs into the lungs in the morning and at bedtime.   nitroGLYCERIN 0.4 MG SL tablet Commonly known as: NITROSTAT   pantoprazole 40 MG tablet Commonly known as: PROTONIX Take 40 mg by mouth daily with breakfast.   rosuvastatin 10 MG tablet Commonly known as: CRESTOR Take 1 tablet (10 mg total) by mouth every evening.   sertraline 25 MG tablet Commonly known as: ZOLOFT Take 1 tablet (25 mg total) by mouth every evening.   tamsulosin 0.4 MG Caps capsule Commonly known as: FLOMAX Take 0.4 mg by mouth daily.   thiamine 100 MG tablet Take 1 tablet (100 mg total) by mouth daily.   torsemide 20 MG tablet Commonly known as: DEMADEX Take 2 tablets (40 mg total) by mouth daily. and take extra 20mg  for 3lbs weight gain in 1 day or 5lbs in 1 week What changed: additional instructions   VITAMIN B 12 PO Take 1 tablet by mouth daily.        Allergies  Allergen Reactions   Brilinta [Ticagrelor] Shortness Of Breath   Penicillins Hives   Ezetimibe-Simvastatin Other (See Comments)    Myalgia     Follow-up Information     [name], DO Follow up.   Specialty: Internal Medicine Why: please call to schedule a follow up apt Contact information: [place] [address] [address] Follow up.   Why: outpatient palliative services Contact information: [address] [place] [address] [phone]         Care, [facility] Follow up.   Specialty: Home Health Services Why: HHPT, [place] [facility] will contact you with apt times. Contact information: [address] [place] [address] [phone]         [facility]. Go in 13 day(s).   Specialty: Cardiology Why: Hospital follow up PLEASE bring current medication list to [place] parking, Entrance C, off of [address]. Contact information: [address] [id number] [place] [phone]                 The results of significant diagnostics from this hospitalization (including imaging, microbiology, ancillary and laboratory) are listed below for reference.    Significant Diagnostic Studies: DG Chest 2 View  Result Date: 03/29/2022 CLINICAL DATA:  Congestive heart failure EXAM: CHEST - 2 VIEW COMPARISON:  03/23/2022 FINDINGS: Chronic cardiomegaly. Coronary artery stents. Aortic atherosclerotic calcification. Pulmonary venous hypertension with interstitial and early alveolar edema. Bilateral effusions larger on the right than the left, associated with atelectasis. Findings slightly worsened since the prior exam. IMPRESSION: Persistent and slightly worsened congestive heart failure as above. Electronically Signed   By: Nelson Chimes M.D.   On: 03/29/2022 11:33   CARDIAC  CATHETERIZATION  Result Date: 03/26/2022 Images from the original result were not included. John [name] is a 81 y.o. male  [id number] LOCATION:  FACILITY: [facility] PHYSICIAN: Quay Burow, M.D. April 24, 1941 DATE OF PROCEDURE:  03/26/2022 DATE OF DISCHARGE: Right heart cardiac CATHETERIZATION History obtained from chart review.81 y.o. male with a hx of combined systolic and diastolic heart failure, PAF, CAD, HTN, HLD, PAD, history of CVA, chronic respiratory failure with hypoxia on 4L, type 2 DM, asbestosis, COPD, and ckd stage IV who has a recurrent  admission for decompensated heart failure the patient was diuresed.  He was evaluated by Dr. Sallyanne Kuster who requested right heart cath to determine hemodynamics and degree of diuresis. PROCEDURE DESCRIPTION: The patient was brought to the second floor [place] [facility] cath lab in the postabsorptive state. He was premedicated with IV Versed and fentanyl. His right antecubital fossa was prepped and shaved in usual sterile fashion. Xylocaine 1% was used for local anesthesia. A 5 French sheath was inserted into the right antecubitalVein .  A 5 French sheath was exchanged for the existing Angiocath already in place.  A 5 French balloontipped Swan-Ganz catheter was then advanced through the right heart chambers obtaining sequential pressures and pulmonary artery blood samples for the determination of Fick cardiac output. HEMODYNAMICS: Right atrial pressure-14/11 Right ventricular pressure-58/7 Pulmonary artery pressure-59/23, mean 41 Pulmonary capillary wedge pressure-A-wave 28, V wave 32, mean 23 Cardiac output (Fick) 5.3 L/min with an index of 2.7 L/min/m.    Mr. Munyan right atrial pressure, pulmonary capillary wedge pressure (V wave) and pulmonary artery pressure are all elevated suggesting that he is inadequately diuresed.  Recommend continued diuresis.  The Swan-Ganz catheter and sheath were removed and a pressure dressing applied.  The patient left lab in stable  condition. Quay Burow. MD, [facility] 03/26/2022 9:58 AM    DG Chest Portable 1 View  Result Date: 03/23/2022 CLINICAL DATA:  Provided history: Shortness of breath. EXAM: PORTABLE CHEST 1 VIEW COMPARISON:  Prior chest radiographs 03/06/2022 and earlier. FINDINGS: Cardiomegaly. Aortic atherosclerosis. Prominence of the interstitial lung markings and subtle bilateral airspace opacities, likely reflecting pulmonary edema. Persistent small to moderate right pleural effusion with associated right basilar atelectasis. No evidence of pneumothorax. No acute bony abnormality identified. Degenerative changes of the spine. IMPRESSION: No significant change from the prior chest radiograph of 03/06/2022. Cardiomegaly with pulmonary edema. Persistent small to moderate right pleural effusion with associated right basilar atelectasis. Aortic Atherosclerosis (ICD10-I70.0). Electronically Signed   By: Kellie Simmering D.O.   On: 03/23/2022 08:22   ECHOCARDIOGRAM LIMITED  Result Date: 03/08/2022    ECHOCARDIOGRAM LIMITED REPORT   Patient Name:   John [name] Date of Exam: 03/08/2022 Medical Rec #:  [id number]     Height:       67.0 in Accession #:    [id number]    Weight:       188.3 lb Date of Birth:  [birth year]      BSA:          1.971 m Patient Age:    33 years      BP:           134/84 mmHg Patient Gender: M             HR:           87 bpm. Exam Location:  [facility] Procedure: Limited Echo and Intracardiac Opacification Agent Indications:    CHF  History:        Patient has prior history of Echocardiogram examinations, most                 recent 01/26/2022. CHF, CAD and Previous Myocardial Infarction,                 COPD, Arrythmias:Atrial Fibrillation, Signs/Symptoms:Chest Pain;                 Risk Factors:Hypertension, Diabetes and Dyslipidemia.  Sonographer:    Wenda Low Referring Phys: [id number] DAVID TAT IMPRESSIONS  1. Limited study.  2.  Poor acoustic windows.  3. LVEF is severely depressed with hypokinesis worse in the  lateral, anterior and apical walls . Left ventricular ejection fraction, by estimation, is 25 to 30%. The left ventricle has severely decreased function. The left ventricular internal cavity size was moderately dilated. There is mild left ventricular hypertrophy.  4. Right ventricular systolic function is normal. The right ventricular size is normal.  5. The inferior vena cava is dilated in size with <50% respiratory variability, suggesting right atrial pressure of 15 mmHg. FINDINGS  Left Ventricle: LVEF is severely depressed with hypokinesis worse in the lateral, anterior and apical walls. Left ventricular ejection fraction, by estimation, is 25 to 30%. The left ventricle has severely decreased function. Definity contrast agent was  given IV to delineate the left ventricular endocardial borders. The left ventricular internal cavity size was moderately dilated. There is mild left ventricular hypertrophy. Right Ventricle: The right ventricular size is normal. Right vetricular wall thickness was not assessed. Right ventricular systolic function is normal. Pericardium: Trivial pericardial effusion is present. Mitral Valve: There is mild thickening of the mitral valve leaflet(s). Mild mitral annular calcification. Tricuspid Valve: The tricuspid valve is not assessed. Pulmonic Valve: The pulmonic valve was grossly normal. Aorta: The aortic root and ascending aorta are structurally normal, with no evidence of dilitation. Venous: The inferior vena cava is dilated in size with less than 50% respiratory variability, suggesting right atrial pressure of 15 mmHg. LEFT VENTRICLE PLAX 2D LVIDd:         5.50 cm LVIDs:         4.80 cm LV PW:         1.10 cm LV IVS:        1.20 cm LVOT diam:     2.00 cm LVOT Area:     3.14 cm  LEFT ATRIUM         Index LA diam:    4.80 cm 2.44 cm/m   AORTA Ao Root diam: 3.20 cm Ao Asc diam:  3.30 cm  SHUNTS Systemic Diam: 2.00 cm Dorris Carnes MD Electronically signed by Dorris Carnes MD Signature  Date/Time: 03/08/2022/4:08:59 PM    Final    DG Chest Port 1 View  Result Date: 03/06/2022 CLINICAL DATA:  Shortness of breath. EXAM: PORTABLE CHEST 1 VIEW COMPARISON:  February 01, 2022. FINDINGS: Stable cardiomegaly. Bilateral pulmonary edema is noted. Bibasilar atelectasis is noted with small right pleural effusion. Bony thorax is unremarkable. IMPRESSION: Bilateral pulmonary edema and bibasilar atelectasis is noted with small right pleural effusion. Electronically Signed   By: Marijo Conception M.D.   On: 03/06/2022 12:32    Microbiology: Recent Results (from the past 240 hour(s))  Resp Panel by RT-PCR (Flu A&B, Covid) Anterior Nasal Swab     Status: None   Collection Time: 03/23/22  7:56 AM   Specimen: Anterior Nasal Swab  Result Value Ref Range Status   SARS Coronavirus 2 by RT PCR NEGATIVE NEGATIVE Final    Comment: (NOTE) SARS-CoV-2 target nucleic acids are NOT DETECTED.  The SARS-CoV-2 RNA is generally detectable in upper respiratory specimens during the acute phase of infection. The lowest concentration of SARS-CoV-2 viral copies this assay can detect is 138 copies/mL. A negative result does not preclude SARS-Cov-2 infection and should not be used as the sole basis for treatment or other patient management decisions. A negative result may occur with  improper specimen collection/handling, submission of specimen other than nasopharyngeal swab, presence of viral mutation(s) within the areas targeted  by this assay, and inadequate number of viral copies(<138 copies/mL). A negative result must be combined with clinical observations, patient history, and epidemiological information. The expected result is Negative.  Fact Sheet for Patients:  EntrepreneurPulse.com.au  Fact Sheet for Healthcare Providers:  IncredibleEmployment.be  This test is no t yet approved or cleared by the [place] FDA and  has been authorized for detection and/or  diagnosis of SARS-CoV-2 by FDA under an Emergency Use Authorization (EUA). This EUA will remain  in effect (meaning this test can be used) for the duration of the COVID-19 declaration under Section 564(b)(1) of the Act, 21 U.S.C.section 360bbb-3(b)(1), unless the authorization is terminated  or revoked sooner.       Influenza A by PCR NEGATIVE NEGATIVE Final   Influenza B by PCR NEGATIVE NEGATIVE Final    Comment: (NOTE) The Xpert Xpress SARS-CoV-2/FLU/RSV plus assay is intended as an aid in the diagnosis of influenza from Nasopharyngeal swab specimens and should not be used as a sole basis for treatment. Nasal washings and aspirates are unacceptable for Xpert Xpress SARS-CoV-2/FLU/RSV testing.  Fact Sheet for Patients: EntrepreneurPulse.com.au  Fact Sheet for Healthcare Providers: IncredibleEmployment.be  This test is not yet approved or cleared by the [place] FDA and has been authorized for detection and/or diagnosis of SARS-CoV-2 by FDA under an Emergency Use Authorization (EUA). This EUA will remain in effect (meaning this test can be used) for the duration of the COVID-19 declaration under Section 564(b)(1) of the Act, 21 U.S.C. section 360bbb-3(b)(1), unless the authorization is terminated or revoked.  Performed at [facility], [place] [address], [place], [place] [address]      Labs: Basic Metabolic Panel: Recent Labs  Lab 03/26/22 0315 03/26/22 4268 03/27/22 0425 03/28/22 0410 03/29/22 0253 03/30/22 0047 03/31/22 0206  NA 136   < > 137 138 136 133* 132*  K 3.7   < > 4.0 3.3* 4.1 3.8 3.8  CL 99  --  100 95* 96* 90* 89*  CO2 29  --  27 31 28 31  34*  GLUCOSE 136*  --  187* 145* 186* 267* 245*  BUN 58*  --  62* 62* 68* 68* 69*  CREATININE 2.29*  --  2.26* 2.32* 2.49* 2.24* 2.25*  CALCIUM 9.1  --  9.0 9.0 8.8* 8.9 9.1  MG 2.1  --   --   --   --  2.0  --    < > = values in this interval not displayed.   Liver  Function Tests: No results for input(s): "AST", "ALT", "ALKPHOS", "BILITOT", "PROT", "ALBUMIN" in the last 168 hours. No results for input(s): "LIPASE", "AMYLASE" in the last 168 hours. No results for input(s): "AMMONIA" in the last 168 hours. CBC: Recent Labs  Lab 03/26/22 0315 03/26/22 0943 03/29/22 0253 03/31/22 0206  WBC 7.9  --  6.6 6.0  HGB 8.5* 9.2*  9.2* 8.3* 9.1*  HCT 26.9* 27.0*  27.0* 27.1* 29.7*  MCV 91.2  --  93.4 92.2  PLT 139*  --  146* 200   Cardiac Enzymes: No results for input(s): "CKTOTAL", "CKMB", "CKMBINDEX", "TROPONINI" in the last 168 hours. BNP: BNP (last 3 results) Recent Labs    03/23/22 0737 03/26/22 0315 03/29/22 0253  BNP 769.5* 795.0* 492.2*    ProBNP (last 3 results) No results for input(s): "PROBNP" in the last 8760 hours.  CBG: Recent Labs  Lab 03/30/22 0557 03/30/22 1134 03/30/22 1549 03/30/22 2146 03/31/22 0609  GLUCAP 173* 185* 195* 284*  195*       Signed:  Domenic Polite MD.  [facility] 03/31/2022, 10:21 AM

## 2022-04-02 ENCOUNTER — Other Ambulatory Visit: Payer: Self-pay | Admitting: *Deleted

## 2022-04-03 NOTE — Patient Outreach (Signed)
[facility]) Care Management Geriatric Nurse Practitioner Note   04/03/2022 Name:  John [name] MRN:  [id number] DOB:  [birth year]  Summary: Pt discharged from the hospital for Acute Coronary Syndrome. This is his 5th admission in 4 months.  Recommendations/Changes made from today's visit: Follow CHF and COPD Action Plans including daily self assessment, taking medications, daily wts, low salt diet and calling MD or NP for any problems ASAP for early intervention to prevent readmission.  Subjective: John [name] is an 81 y.o. year old male who is a primary patient of [name], DO. The care management team was consulted for assistance with care management and/or care coordination needs.    Geriatric Nurse Practitioner completed Home Visit today.   Objective: BP (!) 120/52 (BP Location: Right Arm, Patient Position: Sitting, Cuff Size: Normal)   Pulse 70   Resp 18   Wt 174 lb 6.4 oz (79.1 kg)   SpO2 98%   BMI 27.31 kg/m   Patient Active Problem List   Diagnosis Date Noted   Depression 03/24/2022   Thrombocytopenia ([place]) 03/23/2022   Anemia in CKD (chronic kidney disease) 03/13/2022   Anxiety 03/08/2022   Acute on chronic respiratory failure with hypoxia ([place]) 03/06/2022   Pleural effusion, right    Acute urinary retention 01/30/2022   COPD exacerbation ([place]) 01/26/2022   CKD (chronic kidney disease) stage 4, GFR 15-29 ml/min (HCC) 01/26/2022   Paroxysmal atrial fibrillation ([place]) 01/26/2022   Diabetes mellitus, type 2 ([place]) 01/09/2022   D-dimer, elevated 12/24/2021    Class: Acute   NSTEMI (non-ST elevated myocardial infarction) ([place]) 12/24/2021   Acute on chronic combined systolic and diastolic CHF (congestive heart failure) ([place]) 09/13/2021   Type 2 diabetes mellitus with hyperlipidemia ([place]) 09/13/2021   Obstructive sleep apnea 09/13/2021   BPH (benign prostatic hyperplasia) 09/13/2021   Diabetic neuropathy ([place]) 09/13/2021   Chest pain 62/37/6283    Acute diastolic CHF (congestive heart failure) ([place]) 12/19/2020   Essential hypertension 10/03/2012   CAD (coronary artery disease) of artery bypass graft 06/26/2012   Cardiomyopathy, 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   Peripheral vascular disease ([place]) 06/26/2012   Patient was recently discharged from hospital and all medications have been reviewed. Outpatient Encounter Medications as of 04/02/2022  Medication Sig   albuterol (VENTOLIN HFA) 108 (90 Base) MCG/ACT inhaler Inhale 2 puffs into the lungs every 6 (six) hours as needed for wheezing or shortness of breath.   allopurinol (ZYLOPRIM) 300 MG tablet Take 300 mg by mouth daily.   ALPRAZolam (XANAX) 0.25 MG tablet Take 1 tablet (0.25 mg total) by mouth 2 (two) times daily as needed for anxiety.   apixaban (ELIQUIS) 2.5 MG TABS tablet Take 1 tablet (2.5 mg total) by mouth 2 (two) times daily.   cholecalciferol (VITAMIN D3) 25 MCG (1000 UNIT) tablet Take 1,000 Units by mouth daily.   clopidogrel (PLAVIX) 75 MG tablet Take 1 tablet (75 mg total) by mouth daily.   Cyanocobalamin (VITAMIN B 12 PO) Take 1 tablet by mouth daily.   ferrous sulfate 325 (65 FE) MG tablet Take 1 tablet (325 mg total) by mouth 2 (two) times daily with a meal.   folic acid (FOLVITE) 1 MG tablet Take 1 tablet (1 mg total) by mouth daily.   gabapentin (NEURONTIN) 300 MG capsule Take 1 capsule (300 mg total) by mouth at bedtime.   hydrALAZINE (APRESOLINE) 25 MG tablet Take 1 tablet (25 mg total) by mouth 2 (two) times daily.   insulin glargine (LANTUS SOLOSTAR) 100  UNIT/ML Solostar Pen Inject 20 Units into the skin daily. Dose per sliding scale.   Insulin Pen Needle 32G X 4 MM MISC Use to inject insulin up to 4 times daily.   isosorbide dinitrate (ISORDIL) 20 MG tablet Take 1 tablet (20 mg total) by mouth 2 (two) times daily.   metoprolol succinate (TOPROL-XL) 50 MG 24 hr tablet Take 1 tablet (50 mg total) by mouth daily. Take with or immediately following a meal.    mometasone-formoterol (DULERA) 200-5 MCG/ACT AERO Inhale 2 puffs into the lungs in the morning and at bedtime.   nitroGLYCERIN (NITROSTAT) 0.4 MG SL tablet    pantoprazole (PROTONIX) 40 MG tablet Take 40 mg by mouth daily with breakfast.   rosuvastatin (CRESTOR) 10 MG tablet Take 1 tablet (10 mg total) by mouth every evening.   sertraline (ZOLOFT) 25 MG tablet Take 1 tablet (25 mg total) by mouth every evening.   tamsulosin (FLOMAX) 0.4 MG CAPS capsule Take 0.4 mg by mouth daily.   thiamine 100 MG tablet Take 1 tablet (100 mg total) by mouth daily.   torsemide (DEMADEX) 20 MG tablet Take 2 tablets (40 mg total) by mouth daily. and take extra 20mg  for 3lbs weight gain in 1 day or 5lbs in 1 week   ipratropium-albuterol (DUONEB) 0.5-2.5 (3) MG/3ML SOLN Take 3 mLs by nebulization 3 (three) times daily. (Patient not taking: Reported on 03/16/2022)   No facility-administered encounter medications on file as of 04/02/2022.   SDOH:  (Social Determinants of Health) assessments and interventions performed:  SDOH Interventions    Flowsheet Row Most Recent Value  SDOH Interventions   Financial Strain Interventions Intervention Not Indicated  Physical Activity Interventions Live Life Well, Other (Comments)  [Work with PT and continue home exercises]  Stress Interventions [place]  Review of patient past medical history, allergies, medications, health status, including review of consultants reports, laboratory and other test data, was performed as part of comprehensive evaluation for care management services.   Care Plan  Pt will follow HF and COPD Action plans over the next 30 days to avoid hospitalization within the next 30 days.  Take medications as prescribed   Attend all scheduled provider appointments Call pharmacy for medication refills 3-7 days in advance of running out of medications Call provider office for new concerns or questions     Plan:  Telephone follow up appointment with care management team member scheduled for:  One week.  Pt agrees to the plan of care.  Eulah Pont. Myrtie Neither, MSN, [facility] Gerontological Nurse Practitioner [facility] Care Management [phone]

## 2022-04-05 ENCOUNTER — Encounter: Payer: Self-pay | Admitting: *Deleted

## 2022-04-05 NOTE — Patient Outreach (Signed)
[facility]) Care Management  04/05/2022  John [name] 1941/05/12 [id number]  [name] was discussed at our multidisciplinary meeting today. He has had 5 admissions in 2 months related to CAD, CHF and COPD. NP to follow closely, foster new relationship with pt and his family. NP to visit again next week.  Eulah Pont. Myrtie Neither, MSN, [facility] Gerontological Nurse Practitioner [facility] Care Management [phone]

## 2022-04-06 ENCOUNTER — Ambulatory Visit: Payer: Medicare Other | Admitting: Nurse Practitioner

## 2022-04-06 NOTE — Progress Notes (Deleted)
Office Visit    Patient Name: John [name] Date of Encounter: 04/06/2022  Primary Care Provider:  [name], DO Primary Cardiologist:  Kirk Ruths, MD  Chief Complaint    81 year old male with a history of CAD, chronic systolic heart failure, ICM, paroxysmal atrial fibrillation, hypertension, hyperlipidemia, PVD, CKD stage IV, OSA, CVA, COPD, type 2 diabetes, BPH, GERD, depression, and anxiety who presents for hospital follow-up related to heart failure and CAD.  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)    BPH (benign prostatic hyperplasia)    CAD (coronary artery disease)    CHF (congestive heart failure) (HCC)    Cholesteatoma of attic    Depression    Diabetes mellitus ([place])    Ear disease    GERD (gastroesophageal reflux disease)    Hyperlipidemia    Hypertension    PVD (peripheral vascular disease) ([place])    Stroke [facility])    Past Surgical History:  Procedure Laterality Date   BONE ANCHORED HEARING AID IMPLANT Left 06/25/2013   Procedure: BONE ANCHORED HEARING AID (BAHA) IMPLANT;  Surgeon: Fannie Knee, MD;  Location: [facility];  Service: ENT;  Laterality: Left;   CAROTID STENT INSERTION Left    COCHLEAR IMPLANT     CORONARY STENT PLACEMENT     MASS EXCISION Left 06/25/2013   Procedure: EXCISION LEFT TEMPORAL MASS;  Surgeon: Fannie Knee, MD;  Location: [place];  Service: ENT;  Laterality: Left;   RIGHT HEART CATH N/A 03/26/2022   Procedure: RIGHT HEART CATH;  Surgeon: Lorretta Harp, MD;  Location: [facility];  Service: Cardiovascular;  Laterality: N/A;   TONSILLECTOMY      Allergies  Allergies  Allergen Reactions   Brilinta [Ticagrelor] Shortness Of Breath   Penicillins Hives   Ezetimibe-Simvastatin Other (See Comments)    Myalgia     History of Present Illness    81 year old male with the above past medical history including CAD, chronic systolic heart  failure, ICM, paroxysmal atrial fibrillation, hypertension, hyperlipidemia, PVD, CKD stage IV, OSA, CVA, COPD, type 2 diabetes, BPH, GERD, depression, and anxiety who presents for hospital follow-up related to heart failure and CAD.  History of prior MI at age of 16 in [place].  Catheterization at the time of medical therapy was recommended.  Repeat cardiac catheterization in June 2013 [place] showed EF 40% with hypokinesis of the anterior wall, 30-40% mRCA, 30-40% mPDA, 80% pLAD, 70-80% mLAD, 90% LCx.  He underwent DES x2-LAD, medical therapy was recommended for the circumflex.  He has a history of [place] vascular disease s/p femoropopliteal bypass in 2019, followed by [facility].  He was hospitalized in March 2023 in the setting of NSTEMI, congestive heart failure, new onset atrial fibrillation.  Cardiac catheterization was not pursued due to significant renal insufficiency.  Was started on Eliquis.  He was readmitted  later that month with recurrent CHF.  Echocardiogram in March 2023 showed EF 35%, mild LV enlargement, mild LVH, G2 DD.  Was last seen in the office on 01/23/2022 able from a cardiac standpoint.  Conservative management of CAD was discussed.  He presented to the ED on 03/23/2021 with shortness of breath, weight gain, and hypoxia. He was hospitalized from 03/24/2019 03/31/2022 in the setting of acute heart failure exacerbation.  Troponin was mildly elevated, this was thought to be due to demand ischemia.  Cardiology was consulted.  He  underwent right heart catheterization which showed wedge pressure 23,PA pressures 59/23 consistent with fluid volume overload.  He was diuresed with IV Lasix.  GDMT was limited by CKD.  He was transitioned to p.o. torsemide.  Was discharged home in stable condition on 03/31/2022  He presents today for follow-up.  Since his hospitalization  Chronic systolic heart failure/ICM: CAD: Paroxysmal atrial fibrillation: Hypertension: Hyperlipidemia PVD: CKD stage  IV: OSA: Type 2 diabetes: COPD: Disposition:    Home Medications    Current Outpatient Medications  Medication Sig Dispense Refill   albuterol (VENTOLIN HFA) 108 (90 Base) MCG/ACT inhaler Inhale 2 puffs into the lungs every 6 (six) hours as needed for wheezing or shortness of breath. 8 g 2   allopurinol (ZYLOPRIM) 300 MG tablet Take 300 mg by mouth daily.     ALPRAZolam (XANAX) 0.25 MG tablet Take 1 tablet (0.25 mg total) by mouth 2 (two) times daily as needed for anxiety. 60 tablet 3   apixaban (ELIQUIS) 2.5 MG TABS tablet Take 1 tablet (2.5 mg total) by mouth 2 (two) times daily. 60 tablet 0   cholecalciferol (VITAMIN D3) 25 MCG (1000 UNIT) tablet Take 1,000 Units by mouth daily.     clopidogrel (PLAVIX) 75 MG tablet Take 1 tablet (75 mg total) by mouth daily. 30 tablet 0   Cyanocobalamin (VITAMIN B 12 PO) Take 1 tablet by mouth daily.     ferrous sulfate 325 (65 FE) MG tablet Take 1 tablet (325 mg total) by mouth 2 (two) times daily with a meal. 60 tablet 3   folic acid (FOLVITE) 1 MG tablet Take 1 tablet (1 mg total) by mouth daily. 30 tablet 0   gabapentin (NEURONTIN) 300 MG capsule Take 1 capsule (300 mg total) by mouth at bedtime. 30 capsule 0   hydrALAZINE (APRESOLINE) 25 MG tablet Take 1 tablet (25 mg total) by mouth 2 (two) times daily. 60 tablet 1   insulin glargine (LANTUS SOLOSTAR) 100 UNIT/ML Solostar Pen Inject 20 Units into the skin daily. Dose per sliding scale.     Insulin Pen Needle 32G X 4 MM MISC Use to inject insulin up to 4 times daily. 100 each 0   ipratropium-albuterol (DUONEB) 0.5-2.5 (3) MG/3ML SOLN Take 3 mLs by nebulization 3 (three) times daily. (Patient not taking: Reported on 03/16/2022) 360 mL    isosorbide dinitrate (ISORDIL) 20 MG tablet Take 1 tablet (20 mg total) by mouth 2 (two) times daily. 60 tablet 0   metoprolol succinate (TOPROL-XL) 50 MG 24 hr tablet Take 1 tablet (50 mg total) by mouth daily. Take with or immediately following a meal.      mometasone-formoterol (DULERA) 200-5 MCG/ACT AERO Inhale 2 puffs into the lungs in the morning and at bedtime. 1 each    nitroGLYCERIN (NITROSTAT) 0.4 MG SL tablet      pantoprazole (PROTONIX) 40 MG tablet Take 40 mg by mouth daily with breakfast.     rosuvastatin (CRESTOR) 10 MG tablet Take 1 tablet (10 mg total) by mouth every evening. 30 tablet 0   sertraline (ZOLOFT) 25 MG tablet Take 1 tablet (25 mg total) by mouth every evening. 90 tablet 0   tamsulosin (FLOMAX) 0.4 MG CAPS capsule Take 0.4 mg by mouth daily.     thiamine 100 MG tablet Take 1 tablet (100 mg total) by mouth daily. 30 tablet 0   torsemide (DEMADEX) 20 MG tablet Take 2 tablets (40 mg total) by mouth daily. and take extra 20mg  for 3lbs weight gain  in 1 day or 5lbs in 1 week 60 tablet 0   No current facility-administered medications for this visit.     Review of Systems    ***.  All other systems reviewed and are otherwise negative except as noted above.    Physical Exam    VS:  There were no vitals taken for this visit. , BMI There is no height or weight on file to calculate BMI.     GEN: Well nourished, well developed, in no acute distress. HEENT: normal. Neck: Supple, no JVD, carotid bruits, or masses. Cardiac: RRR, no murmurs, rubs, or gallops. No clubbing, cyanosis, edema.  Radials/DP/PT 2+ and equal bilaterally.  Respiratory:  Respirations regular and unlabored, clear to auscultation bilaterally. GI: Soft, nontender, nondistended, BS + x 4. MS: no deformity or atrophy. Skin: warm and dry, no rash. Neuro:  Strength and sensation are intact. Psych: Normal affect.  Accessory Clinical Findings    ECG personally reviewed by me today - *** - no acute changes.  Lab Results  Component Value Date   WBC 6.0 03/31/2022   HGB 9.1 (L) 03/31/2022   HCT 29.7 (L) 03/31/2022   MCV 92.2 03/31/2022   PLT 200 03/31/2022   Lab Results  Component Value Date   CREATININE 2.25 (H) 03/31/2022   BUN 69 (H) 03/31/2022    NA 132 (L) 03/31/2022   K 3.8 03/31/2022   CL 89 (L) 03/31/2022   CO2 34 (H) 03/31/2022   Lab Results  Component Value Date   ALT 13 03/23/2022   AST 16 03/23/2022   ALKPHOS 51 03/23/2022   BILITOT 0.9 03/23/2022   Lab Results  Component Value Date   CHOL 99 12/24/2021   HDL 51 12/24/2021   LDLCALC 29 12/24/2021   TRIG 95 12/24/2021   CHOLHDL 1.9 12/24/2021    Lab Results  Component Value Date   HGBA1C 7.1 (H) 12/24/2021    Assessment & Plan    1.  ***   Lenna Sciara, NP 04/06/2022, 5:59 AM

## 2022-04-09 ENCOUNTER — Other Ambulatory Visit: Payer: Self-pay | Admitting: *Deleted

## 2022-04-09 NOTE — Progress Notes (Signed)
HEART & VASCULAR TRANSITION OF CARE CONSULT NOTE     Referring Physician: Dr Broadus John Primary Care: Dr Francesco Sor Primary Cardiologist: Dr [name]  [facility]  HPI: Referred to clinic by Dr [name]  for heart failure consultation.   John [name] is a 81 y.o. male with a history of chronic 4L O2 dependence, COPD, asbestosis, combined HFrEF, PAF, CAD , T2DM, HTN, HLD, PAD, history of CVA, and stage IV CKD .   Had CAD with prior MI 40 years ago. Cardiac catheterization in June 2013 in [place] with PCI to the LADx2. There was residual 30-40% mid right coronary artery and a 30-40% mid PDA. The left main was normal. The circumflex had a 90% lesion after the origin of the first marginal. EF was 40%.    Patient had an NSTEMI in 12/2021, however medical management was pursued due to AKI. Echocardiogram on 12/25/21 showed EF 35%, moderately decreased LV function, mild LVH, grade II diastolic dysfunction, normal RV systolic function. Continued on carvedilol, hydralazine, ndisordil. Was not discharged on diuretic given AKI and possible ATN. Creatinine was 3.18 Discharged on 12/29/21.   Patient  seen by cardiology on 01/09/22 for evaluation of increasing SOB and weight gain. Was diuresed with IV lasix, discharged on oral demadex on 3/23. At his follow up appointment on 01/23/22, patient was euvolemic.  He did complain of some dyspnea on exertion, discussed cardiac catheterization but decided to continue medical management of CAD.   On 01/26/22 he was seen at [facility] with increased shortness of breath and admitted.  O2 sats in the 60s. Underwent limited echocardiogram on 01/26/22 that showed EF 30-35%, mild LVH. Patient underwent thoracentesis and was diuresed. Discharged 02/05/22.  Discharged on torsemide 40 mg daily.    Readmitted to [facility] 03/06/22 with A/C HFrEF and AECOPD. Limited echocardiogram on 03/08/22 showed EF 25-30%, moderately dilated LV, normal RV systolic function. Patient was diuresed and discharged on demadex 40 mg  BID, toprol-xl, hydralazine, isordil. Discharged 03/12/22 . Discharge weight 182 pounds.   Admitted 03/23/22 with volume overload. Diuresed with IV lasix and transitioned to torsemide 40 mg daily. GDMT limited by CKD. Discharged 03/31/22 with weight 176 pounds.   Presents with his daughter today. Overall feeling fine. Having some shortness of breath with exertion. Wearing 3 liters [place]. Denies PND/Orthopnea. No chest pain. Appetite ok. Tries to limit fluid intake to < 2 liters. No fever or chills. Weight at home 178-180 pounds. 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 Taking all medications. He has been taking extra torsemide for the last few days. His daughter or son in law take him to appointments. He is followed by [facility] OT/PT and was seen by [facility] NP Deloria Lair.   03/31/22 Creatinine 2.25  Cardiac Testing  Echo 02/2022 EF 25-30%.   Echo 12/2021 EF 35%  Echo 08/2021 40-45%   RHC 03/2022  RA 11 PA 59/23 (41)  PCWP 23 CO 5.3 CI 2.7    Review of Systems: [y] = yes, [ ]  = no   General: Weight gain [ Y]; Weight loss [ ] ; Anorexia [ ] ; Fatigue [ Y]; Fever [ ] ; Chills [ ] ; Weakness [Y ]  Cardiac: Chest pain/pressure [ ] ; Resting SOB [ ] ; Exertional SOB [ Y]; Orthopnea [ ] ; Pedal Edema [ ] ; Palpitations [ ] ; Syncope [ ] ; Presyncope [ ] ; Paroxysmal nocturnal dyspnea[ ]   Pulmonary: Cough [ ] ; Wheezing[ ] ; Hemoptysis[ ] ; Sputum [ ] ; Snoring [ ]   GI: Vomiting[ ] ; Dysphagia[ ] ; Melena[ ] ;  Hematochezia [ ] ; Heartburn[ ] ; Abdominal pain [ ] ; Constipation [ ] ; Diarrhea [ ] ; BRBPR [ ]   GU: Hematuria[ ] ; Dysuria [ ] ; Nocturia[ ]   Vascular: Pain in legs with walking [ ] ; Pain in feet with lying flat [ ] ; Non-healing sores [ ] ; Stroke [ ] ; TIA [ ] ; Slurred speech [ ] ;  Neuro: Headaches[ ] ; Vertigo[ ] ; Seizures[ ] ; Paresthesias[ ] ;Blurred vision [ ] ; Diplopia [ ] ; Vision changes [ ]   Ortho/Skin: Arthritis [ ] ; Joint pain [ Y]; Muscle pain [ ] ; Joint swelling [ ] ; Back Pain [ Y]; Rash [ ]   Psych:  Depression[ ] ; Anxiety[ ]   Heme: Bleeding problems [ ] ; Clotting disorders [ ] ; Anemia [ ]   Endocrine: Diabetes [ Y]; Thyroid dysfunction[ ]    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)    BPH (benign prostatic hyperplasia)    CAD (coronary artery disease)    CHF (congestive heart failure) (HCC)    Cholesteatoma of attic    Depression    Diabetes mellitus (HCC)    Ear disease    GERD (gastroesophageal reflux disease)    Hyperlipidemia    Hypertension    PVD (peripheral vascular disease) (HCC)    Stroke (HCC)     Current Outpatient Medications  Medication Sig Dispense Refill   albuterol (VENTOLIN HFA) 108 (90 Base) MCG/ACT inhaler Inhale 2 puffs into the lungs every 6 (six) hours as needed for wheezing or shortness of breath. 8 g 2   allopurinol (ZYLOPRIM) 300 MG tablet Take 300 mg by mouth daily.     ALPRAZolam (XANAX) 0.25 MG tablet Take 1 tablet (0.25 mg total) by mouth 2 (two) times daily as needed for anxiety. 60 tablet 3   apixaban (ELIQUIS) 2.5 MG TABS tablet Take 1 tablet (2.5 mg total) by mouth 2 (two) times daily. 60 tablet 0   cholecalciferol (VITAMIN D3) 25 MCG (1000 UNIT) tablet Take 1,000 Units by mouth daily.     clopidogrel (PLAVIX) 75 MG tablet Take 1 tablet (75 mg total) by mouth daily. 30 tablet 0   Cyanocobalamin (VITAMIN B 12 PO) Take 1 tablet by mouth daily.     ferrous sulfate 325 (65 FE) MG tablet Take 1 tablet (325 mg total) by mouth 2 (two) times daily with a meal. 60 tablet 3   folic acid (FOLVITE) 1 MG tablet Take 1 tablet (1 mg total) by mouth daily. 30 tablet 0   gabapentin (NEURONTIN) 300 MG capsule Take 1 capsule (300 mg total) by mouth at bedtime. 30 capsule 0   hydrALAZINE (APRESOLINE) 25 MG tablet Take 1 tablet (25 mg total) by mouth 2 (two) times daily. 60 tablet 1   insulin glargine (LANTUS SOLOSTAR) 100 UNIT/ML Solostar Pen Inject 20 Units into the skin daily. Dose per sliding scale.     Insulin Pen Needle 32G X 4 MM  MISC Use to inject insulin up to 4 times daily. 100 each 0   ipratropium-albuterol (DUONEB) 0.5-2.5 (3) MG/3ML SOLN Take 3 mLs by nebulization 3 (three) times daily. 360 mL    isosorbide dinitrate (ISORDIL) 20 MG tablet Take 1 tablet (20 mg total) by mouth 2 (two) times daily. 60 tablet 0   metoprolol succinate (TOPROL-XL) 50 MG 24 hr tablet Take 1 tablet (50 mg total) by mouth daily. Take with or immediately following a meal.     mometasone-formoterol (DULERA) 200-5 MCG/ACT AERO Inhale 2 puffs into the lungs in the morning and at bedtime.  1 each    nitroGLYCERIN (NITROSTAT) 0.4 MG SL tablet      pantoprazole (PROTONIX) 40 MG tablet Take 40 mg by mouth daily with breakfast.     rosuvastatin (CRESTOR) 10 MG tablet Take 1 tablet (10 mg total) by mouth every evening. 30 tablet 0   sertraline (ZOLOFT) 25 MG tablet Take 1 tablet (25 mg total) by mouth every evening. 90 tablet 0   tamsulosin (FLOMAX) 0.4 MG CAPS capsule Take 0.4 mg by mouth daily.     thiamine 100 MG tablet Take 1 tablet (100 mg total) by mouth daily. 30 tablet 0   torsemide (DEMADEX) 20 MG tablet Take 2 tablets (40 mg total) by mouth daily. and take extra 20mg  for 3lbs weight gain in 1 day or 5lbs in 1 week 60 tablet 0   No current facility-administered medications for this encounter.    Allergies  Allergen Reactions   Brilinta [Ticagrelor] Shortness Of Breath   Penicillins Hives   Ezetimibe-Simvastatin Other (See Comments)    Myalgia       Social History   Socioeconomic History   Marital status: Widowed    Spouse name: Not on file   Number of children: 3   Years of education: Not on file   Highest education level: Not on file  Occupational History   Occupation: retired  Tobacco Use   Smoking status: Former    Types: Cigarettes    Quit date: 05/19/1989    Years since quitting: 32.9   Smokeless tobacco: Never  Vaping Use   Vaping Use: Never used  Substance and Sexual Activity   Alcohol use: Not Currently   Drug  use: No   Sexual activity: Not on file  Other Topics Concern   Not on file  Social History Narrative   Not on file   Social Determinants of Health   Financial Resource Strain: Low Risk  (04/02/2022)   Overall Financial Resource Strain (CARDIA)    Difficulty of Paying Living Expenses: Not hard at all  Food Insecurity: No Food Insecurity (03/28/2022)   Hunger Vital Sign    Worried About Running Out of Food in the Last Year: Never true    [place] in the Last Year: Never true  Transportation Needs: No Transportation Needs (03/28/2022)   PRAPARE - Hydrologist (Medical): No    Lack of Transportation (Non-Medical): No  Physical Activity: Inactive (04/02/2022)   Exercise Vital Sign    Days of Exercise per Week: 0 days    Minutes of Exercise per Session: 0 min  Stress: Stress Concern Present (04/02/2022)   [place]    Feeling of Stress : To some extent  Social Connections: Not on file  Intimate Partner Violence: Not on file      Family History  Problem Relation Age of Onset   Diabetes Other    Diabetes Mother    Diabetes Maternal Aunt    Depression Other    Depression Other    Drug abuse Daughter    Diabetes Daughter     Vitals:   04/10/22 1014  BP: (!) 152/64  Pulse: 94  SpO2: 95%  Weight: 83.5 kg (184 lb)   Wt Readings from Last 3 Encounters:  04/10/22 83.5 kg (184 lb)  04/10/22 81.2 kg (179 lb)  04/02/22 79.1 kg (174 lb 6.4 oz)     PHYSICAL EXAM: General:  walked in the clinic with  a cane.  No respiratory difficulty HEENT: normal Neck: supple. JVP 9-10 . Carotids 2+ bilat; no bruits. No lymphadenopathy or thryomegaly appreciated. Cor: PMI nondisplaced. Regular rate & rhythm. No rubs, gallops or murmurs. Lungs: clear on 4 liters [place].  Abdomen: soft, nontender, distended. No hepatosplenomegaly. No bruits or masses. Good bowel sounds. Extremities: no cyanosis,  clubbing, rash, edema Neuro: alert & oriented x 3, cranial nerves grossly intact. moves all 4 extremities w/o difficulty. Affect pleasant.   ASSESSMENT & PLAN: 1. HFrEF, ICM EF down to 25-30% in May from previous 40-45% in 2022. Cath was not pursued to due to CKD Stage IV. He has had previous stents to LAD. Currently not having chest pain.  NYHA III GDMT  Diuretic-Volume status trending up. Instructed to increase torsemide to 60 mg daily and take 2.5 mg metolazone on Saturdays. Will need to add 20 meq K daily. Suspect he has narrow euvolemic window. Considered Furoscix but with neuropathy I am concerned he may be at risk for falls.  BB- Ace/ARB/ARNI- No due to CKD Stage IV MRA- No CKD Stage IV  SGLT2i- considered but concerned about hygiene. GFR would be ok for jardiance but for now hold off.  - Continue current dose of hydralazine and isordil.   2. CAD Previous stent 2013 to LAD.  No chest pain.  On eliquis and plavix. No on statin due to myalgias.   3. CKD Stage IV Creatinine baseline variable but looks like ~ 2.3-2.5.  Check BMET today.   Referred to HFSW (PCP, Medications, Transportation, ETOH Abuse, Drug Abuse, Insurance, Financial ): No Refer to Pharmacy:  No Refer to Home Health: Followed by Alvis Lemmings.  Refer to Advanced Heart Failure Clinic: no  Refer to General Cardiology: He is an exisisting patient of Dr [name].   He would benefit from Palliative Care for goals of care.   Will see in 2 weeks to reassess his volume status. If he is doing ok will see as needed.   Mckinzie Saksa NP-C  1:28 PM

## 2022-04-10 ENCOUNTER — Telehealth (HOSPITAL_COMMUNITY): Payer: Self-pay

## 2022-04-10 ENCOUNTER — Ambulatory Visit (HOSPITAL_COMMUNITY)
Admit: 2022-04-10 | Discharge: 2022-04-10 | Disposition: A | Discharge status: Home / Self Care | Payer: Medicare Other | Attending: Adult Health | Admitting: Adult Health

## 2022-04-10 VITALS — BP 152/64 | HR 94 | Wt 184.0 lb

## 2022-04-10 DIAGNOSIS — F419 Anxiety disorder, unspecified: Secondary | ICD-10-CM | POA: Insufficient documentation

## 2022-04-10 DIAGNOSIS — E1122 Type 2 diabetes mellitus with diabetic chronic kidney disease: Secondary | ICD-10-CM | POA: Insufficient documentation

## 2022-04-10 DIAGNOSIS — Z955 Presence of coronary angioplasty implant and graft: Secondary | ICD-10-CM | POA: Insufficient documentation

## 2022-04-10 DIAGNOSIS — E785 Hyperlipidemia, unspecified: Secondary | ICD-10-CM | POA: Insufficient documentation

## 2022-04-10 DIAGNOSIS — Z7901 Long term (current) use of anticoagulants: Secondary | ICD-10-CM | POA: Diagnosis not present

## 2022-04-10 DIAGNOSIS — Z9981 Dependence on supplemental oxygen: Secondary | ICD-10-CM | POA: Diagnosis not present

## 2022-04-10 DIAGNOSIS — I2510 Atherosclerotic heart disease of native coronary artery without angina pectoris: Secondary | ICD-10-CM | POA: Diagnosis not present

## 2022-04-10 DIAGNOSIS — I130 Hypertensive heart and chronic kidney disease with heart failure and stage 1 through stage 4 chronic kidney disease, or unspecified chronic kidney disease: Secondary | ICD-10-CM | POA: Diagnosis not present

## 2022-04-10 DIAGNOSIS — Z79899 Other long term (current) drug therapy: Secondary | ICD-10-CM | POA: Insufficient documentation

## 2022-04-10 DIAGNOSIS — Z8673 Personal history of transient ischemic attack (TIA), and cerebral infarction without residual deficits: Secondary | ICD-10-CM | POA: Diagnosis not present

## 2022-04-10 DIAGNOSIS — I25810 Atherosclerosis of coronary artery bypass graft(s) without angina pectoris: Secondary | ICD-10-CM

## 2022-04-10 DIAGNOSIS — F32A Depression, unspecified: Secondary | ICD-10-CM | POA: Insufficient documentation

## 2022-04-10 DIAGNOSIS — I5022 Chronic systolic (congestive) heart failure: Secondary | ICD-10-CM | POA: Insufficient documentation

## 2022-04-10 DIAGNOSIS — I252 Old myocardial infarction: Secondary | ICD-10-CM | POA: Insufficient documentation

## 2022-04-10 DIAGNOSIS — Z7902 Long term (current) use of antithrombotics/antiplatelets: Secondary | ICD-10-CM | POA: Diagnosis not present

## 2022-04-10 DIAGNOSIS — Z794 Long term (current) use of insulin: Secondary | ICD-10-CM | POA: Diagnosis not present

## 2022-04-10 DIAGNOSIS — J449 Chronic obstructive pulmonary disease, unspecified: Secondary | ICD-10-CM | POA: Insufficient documentation

## 2022-04-10 DIAGNOSIS — I480 Paroxysmal atrial fibrillation: Secondary | ICD-10-CM | POA: Insufficient documentation

## 2022-04-10 DIAGNOSIS — N184 Chronic kidney disease, stage 4 (severe): Secondary | ICD-10-CM | POA: Insufficient documentation

## 2022-04-10 DIAGNOSIS — E1140 Type 2 diabetes mellitus with diabetic neuropathy, unspecified: Secondary | ICD-10-CM | POA: Diagnosis not present

## 2022-04-10 DIAGNOSIS — J61 Pneumoconiosis due to asbestos and other mineral fibers: Secondary | ICD-10-CM | POA: Diagnosis not present

## 2022-04-10 LAB — BASIC METABOLIC PANEL
Anion gap: 10 (ref 5–15)
BUN: 70 mg/dL — ABNORMAL HIGH (ref 8–23)
CO2: 29 mmol/L (ref 22–32)
Calcium: 9.2 mg/dL (ref 8.9–10.3)
Chloride: 98 mmol/L (ref 98–111)
Creatinine, Ser: 2.42 mg/dL — ABNORMAL HIGH (ref 0.61–1.24)
GFR, Estimated: 26 mL/min — ABNORMAL LOW (ref >60)
Glucose, Bld: 237 mg/dL — ABNORMAL HIGH (ref 70–99)
Potassium: 3.5 mmol/L (ref 3.5–5.1)
Sodium: 137 mmol/L (ref 135–145)

## 2022-04-10 LAB — BRAIN NATRIURETIC PEPTIDE: B Natriuretic Peptide: 777.2 pg/mL — ABNORMAL HIGH (ref 0.0–100.0)

## 2022-04-10 MED ORDER — METOLAZONE 2.5 MG PO TABS: 2.5000 mg | ORAL_TABLET | ORAL | 0 refills | Status: DC

## 2022-04-10 MED ORDER — TORSEMIDE 20 MG PO TABS: 60.0000 mg | ORAL_TABLET | Freq: Every day | ORAL | 0 refills | Status: DC

## 2022-04-10 MED ORDER — POTASSIUM CHLORIDE CRYS ER 20 MEQ PO TBCR: 20.0000 meq | EXTENDED_RELEASE_TABLET | Freq: Every day | ORAL | 0 refills | Status: DC

## 2022-04-10 NOTE — Addendum Note (Signed)
Encounter addended by: Conrad [place], NP on: 04/10/2022 1:38 PM  Actions taken: Level of Service modified

## 2022-04-10 NOTE — Telephone Encounter (Signed)
Left message with dtr to call back. Spoke with patient, updated. Pt aware, agreeable, and verbalized understanding to increase Kdur to 2mEq on Saturday with metolazone dose.  Will explain to daughter to confirm--she sets up medications.    Conrad Olivia Lopez de Gutierrez, NP  04/10/2022 12:40 PM EDT     BNP elevated. Increase torsemide 60 mg daily and added metolazone. K 3.5. Continue 20 meq K dur daily. Please instruct to take 40 meq on Metolazone day (Saturdays). Please call his daughter.

## 2022-04-10 NOTE — Patient Instructions (Signed)
Increase Torsemide to 60 mg (3 tabs) Daily  START Potassium (k-dur) 20 meq (1 tab) Daily  START Metolazone 2.5 mg every Saturday   Labs done today, your results will be available in MyChart, we will contact you for abnormal readings.  Do the following things EVERYDAY: Weigh yourself in the morning before breakfast. Write it down and keep it in a log. Take your medicines as prescribed Eat low salt foods--Limit salt (sodium) to 2000 mg per day.  Stay as active as you can everyday Limit all fluids for the day to less than 2 liters  Thank you for allowing [place] to provider your heart failure care after your recent hospitalization. Please follow-up with Dr [name] as scheduled 6/21 and with [place] again in 2 weeks  If you have any questions, issues, or concerns before your next appointment please call our office at [phone], opt. 2 and leave a message for the triage nurse.

## 2022-04-10 NOTE — Patient Outreach (Signed)
[place] [facility]) Care Management Geriatric Nurse Practitioner Note   04/10/2022 Name:  John [name] MRN:  [id number] DOB:  12-25-1940  Summary: Stabilizing, following ACTION PLANS (HF/COPD)  Recommendations/Changes made from today's visit: Take an extra torsemide 20 mg tablet if you weight goes up to 180# and call your MD or NP to report.  Subjective: John [name] is an 81 y.o. year old male who is a primary patient of John Margarita, DO. The care management team was consulted for assistance with care management and/or care coordination needs.    Geriatric Nurse Practitioner completed Home Visit today.   Outpatient Encounter Medications as of 04/09/2022  Medication Sig   ipratropium-albuterol (DUONEB) 0.5-2.5 (3) MG/3ML SOLN Take 3 mLs by nebulization 3 (three) times daily.   albuterol (VENTOLIN HFA) 108 (90 Base) MCG/ACT inhaler Inhale 2 puffs into the lungs every 6 (six) hours as needed for wheezing or shortness of breath.   allopurinol (ZYLOPRIM) 300 MG tablet Take 300 mg by mouth daily.   ALPRAZolam (XANAX) 0.25 MG tablet Take 1 tablet (0.25 mg total) by mouth 2 (two) times daily as needed for anxiety.   apixaban (ELIQUIS) 2.5 MG TABS tablet Take 1 tablet (2.5 mg total) by mouth 2 (two) times daily.   cholecalciferol (VITAMIN D3) 25 MCG (1000 UNIT) tablet Take 1,000 Units by mouth daily.   clopidogrel (PLAVIX) 75 MG tablet Take 1 tablet (75 mg total) by mouth daily.   Cyanocobalamin (VITAMIN B 12 PO) Take 1 tablet by mouth daily.   ferrous sulfate 325 (65 FE) MG tablet Take 1 tablet (325 mg total) by mouth 2 (two) times daily with a meal.   folic acid (FOLVITE) 1 MG tablet Take 1 tablet (1 mg total) by mouth daily.   gabapentin (NEURONTIN) 300 MG capsule Take 1 capsule (300 mg total) by mouth at bedtime.   hydrALAZINE (APRESOLINE) 25 MG tablet Take 1 tablet (25 mg total) by mouth 2 (two) times daily.   insulin glargine (LANTUS SOLOSTAR) 100 UNIT/ML Solostar Pen Inject  20 Units into the skin daily. Dose per sliding scale.   Insulin Pen Needle 32G X 4 MM MISC Use to inject insulin up to 4 times daily.   isosorbide dinitrate (ISORDIL) 20 MG tablet Take 1 tablet (20 mg total) by mouth 2 (two) times daily.   metoprolol succinate (TOPROL-XL) 50 MG 24 hr tablet Take 1 tablet (50 mg total) by mouth daily. Take with or immediately following a meal.   mometasone-formoterol (DULERA) 200-5 MCG/ACT AERO Inhale 2 puffs into the lungs in the morning and at bedtime.   nitroGLYCERIN (NITROSTAT) 0.4 MG SL tablet    pantoprazole (PROTONIX) 40 MG tablet Take 40 mg by mouth daily with breakfast.   rosuvastatin (CRESTOR) 10 MG tablet Take 1 tablet (10 mg total) by mouth every evening.   sertraline (ZOLOFT) 25 MG tablet Take 1 tablet (25 mg total) by mouth every evening.   tamsulosin (FLOMAX) 0.4 MG CAPS capsule Take 0.4 mg by mouth daily.   thiamine 100 MG tablet Take 1 tablet (100 mg total) by mouth daily.   torsemide (DEMADEX) 20 MG tablet Take 2 tablets (40 mg total) by mouth daily. and take extra 20mg  for 3lbs weight gain in 1 day or 5lbs in 1 week   No facility-administered encounter medications on file as of 04/09/2022.   Care Plan  Review of patient past medical history, allergies, medications, health status, including review of consultants reports, laboratory and other test data, was performed  as part of comprehensive evaluation for care management services.   Pt will follow HF and COPD Action plans over the next 30 days to avoid hospitalization within the next 30 days.  Update 04/09/22:  (Status: Goal on Track (progressing): YES.) Short Term Goal  Evaluation of current treatment plan related to HF / COPD ACTION PLANS and patient's adherence to plan as established by provider [name] is "feeling pretty good" He felt like he was retaining fluid yesterday because his wt had gone up to 181.9# and he took an extra torsemide. Weight last week was 174#. We had discussed that  if his weight got up to 180# he should take an additional torsemide. He is no more SOB that usual, has no peripheral edema but does feel his belly is larger than when he is at a drier wt. He is following his water restriction and does not add salt to his food. We talked about being mindful when he eats out to choose a heart healthy choice and try to avoid fast foods. Reinforced the regimen and to call if he is in the [place] for SOB as it could be either his HF or COPD.  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  [name] agrees to the plan of care.  Eulah Pont. Myrtie Neither, MSN, [facility] Gerontological Nurse Practitioner [facility] Care Management [phone]

## 2022-04-10 NOTE — Telephone Encounter (Signed)
Called to confirm Heart & Vascular Transitions of Care appointment at [facility]. Patient reminded to bring all medications and pill box organizer with them. Confirmed patient has transportation. Gave directions, instructed to utilize [address] parking.  Confirmed appointment prior to ending call.

## 2022-04-16 ENCOUNTER — Other Ambulatory Visit (HOSPITAL_COMMUNITY): Payer: Self-pay

## 2022-04-16 ENCOUNTER — Other Ambulatory Visit: Payer: Self-pay | Admitting: *Deleted

## 2022-04-18 ENCOUNTER — Ambulatory Visit (INDEPENDENT_AMBULATORY_CARE_PROVIDER_SITE_OTHER): Payer: Medicare Other | Admitting: Nurse Practitioner

## 2022-04-18 ENCOUNTER — Encounter: Payer: Self-pay | Admitting: Nurse Practitioner

## 2022-04-18 VITALS — BP 126/60 | HR 76 | Ht 67.0 in | Wt 180.8 lb

## 2022-04-18 DIAGNOSIS — I5022 Chronic systolic (congestive) heart failure: Secondary | ICD-10-CM

## 2022-04-18 DIAGNOSIS — I2510 Atherosclerotic heart disease of native coronary artery without angina pectoris: Secondary | ICD-10-CM | POA: Diagnosis not present

## 2022-04-18 DIAGNOSIS — I255 Ischemic cardiomyopathy: Secondary | ICD-10-CM | POA: Diagnosis not present

## 2022-04-18 DIAGNOSIS — I480 Paroxysmal atrial fibrillation: Secondary | ICD-10-CM | POA: Diagnosis not present

## 2022-04-18 DIAGNOSIS — N184 Chronic kidney disease, stage 4 (severe): Secondary | ICD-10-CM

## 2022-04-18 DIAGNOSIS — E785 Hyperlipidemia, unspecified: Secondary | ICD-10-CM

## 2022-04-18 DIAGNOSIS — J449 Chronic obstructive pulmonary disease, unspecified: Secondary | ICD-10-CM

## 2022-04-18 DIAGNOSIS — Z794 Long term (current) use of insulin: Secondary | ICD-10-CM

## 2022-04-18 DIAGNOSIS — G4733 Obstructive sleep apnea (adult) (pediatric): Secondary | ICD-10-CM

## 2022-04-18 DIAGNOSIS — I739 Peripheral vascular disease, unspecified: Secondary | ICD-10-CM

## 2022-04-18 DIAGNOSIS — E1169 Type 2 diabetes mellitus with other specified complication: Secondary | ICD-10-CM

## 2022-04-18 DIAGNOSIS — I10 Essential (primary) hypertension: Secondary | ICD-10-CM

## 2022-04-18 NOTE — Progress Notes (Signed)
Office Visit    Patient Name: John [name] Date of Encounter: 04/18/2022  Primary Care Provider:  [name], DO Primary Cardiologist:  Kirk Ruths, MD  Chief Complaint    81 year old male with the above past medical history including CAD, chronic systolic heart failure, ICM, paroxysmal atrial fibrillation, hypertension, hyperlipidemia, PVD, CKD stage IV, OSA, CVA, COPD, type 2 diabetes, BPH, GERD, depression, and anxiety who presents for follow-up related to heart failure and CAD.  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)    BPH (benign prostatic hyperplasia)    CAD (coronary artery disease)    CHF (congestive heart failure) (HCC)    Cholesteatoma of attic    Depression    Diabetes mellitus ([place])    Ear disease    GERD (gastroesophageal reflux disease)    Hyperlipidemia    Hypertension    PVD (peripheral vascular disease) ([place])    Stroke [facility])    Past Surgical History:  Procedure Laterality Date   BONE ANCHORED HEARING AID IMPLANT Left 06/25/2013   Procedure: BONE ANCHORED HEARING AID (BAHA) IMPLANT;  Surgeon: Fannie Knee, MD;  Location: [place];  Service: ENT;  Laterality: Left;   CAROTID STENT INSERTION Left    COCHLEAR IMPLANT     CORONARY STENT PLACEMENT     MASS EXCISION Left 06/25/2013   Procedure: EXCISION LEFT TEMPORAL MASS;  Surgeon: Fannie Knee, MD;  Location: [place];  Service: ENT;  Laterality: Left;   RIGHT HEART CATH N/A 03/26/2022   Procedure: RIGHT HEART CATH;  Surgeon: Lorretta Harp, MD;  Location: [facility];  Service: Cardiovascular;  Laterality: N/A;   TONSILLECTOMY      Allergies  Allergies  Allergen Reactions   Brilinta [Ticagrelor] Shortness Of Breath   Penicillins Hives   Ezetimibe-Simvastatin Other (See Comments)    Myalgia     History of Present Illness    81 year old male with the above past medical history including CAD,  chronic systolic heart failure, ICM, paroxysmal atrial fibrillation, hypertension, hyperlipidemia, PVD, CKD stage IV, OSA, CVA, COPD, type 2 diabetes, BPH, GERD, depression, and anxiety.  History of prior MI at age of 50 in [place]. Repeat cardiac catheterization in June 2013 in [place] showed EF 40% with hypokinesis of the anterior wall, 30-40% mRCA, 30-40% mPDA, 80% pLAD, 70-80% mLAD, 90% LCx.  He underwent DES x2-LAD, medical therapy was recommended for the circumflex. He has a history of [place] vascular disease s/p femoropopliteal bypass in 2019, followed by [facility].  He was hospitalized in March 2023 in the setting of NSTEMI, congestive heart failure, new onset atrial fibrillation. Cardiac catheterization was not pursued due to significant renal insufficiency.  Was started on Eliquis. He was readmitted  later that month with recurrent CHF.  Echocardiogram in March 2023 showed EF 35%, mild LV enlargement, mild LVH, G2 DD. He was last seen in the office on 01/23/2022 able from a cardiac standpoint.  Conservative management of CAD was discussed.  He was hospitalized in April 2023 in the setting of acute systolic heart failure, pleural effusion.  He underwent thoracentesis and was diuresed.  He was discharged on torsemide 40 mg daily.  Readmitted in May 2023 with acute on chronic systolic heart failure, COPD exacerbation.  Repeat limited echo in May 2023 showed EF 25 to 30%, moderately dilated LV, normal RV systolic function.  He underwent further diuresis.  He presented to the ED on 03/23/2021 with shortness of breath, weight gain, and hypoxia. He was hospitalized from 03/24/2019 03/31/2022 in the setting of acute heart failure exacerbation.  Troponin was mildly elevated, this was thought to be due to demand ischemia.  Cardiology was consulted.  He underwent right heart catheterization which showed wedge pressure 23,PA pressures 59/23 consistent with fluid volume overload.  He was diuresed with IV Lasix.  GDMT  was limited by CKD.  He was transitioned to p.o. torsemide.  He was discharged home in stable condition on 03/31/2022.  He was evaluated in the advanced heart failure clinic on 04/10/2022 stable overall from a cardiac standpoint.  He did report some mild dyspnea, he denies symptoms concerning for angina.  Torsemide was increased to 60 mg daily.  Additionally, he was advised to take 2.5 mg metolazone on Saturdays.  Escalation of GDMT was limited in the setting of CKD stage IV.  SGLT2 inhibitor was considered but was not initiated in the setting of poor hygiene.  Palliative care consult for goals of care was recommended.  He was advised to follow-up in 2 weeks.  He presents today for follow-up accompanied by his daughter. Since his last visit he has been stable from a cardiac standpoint. He denies worsening dyspnea, edema, PND, orthopnea. Symptoms concerning for angina.  He is tolerating his current medication regimen. Overall, he reports feeling well and denies any new concerns today.  Home Medications    Current Outpatient Medications  Medication Sig Dispense Refill   albuterol (VENTOLIN HFA) 108 (90 Base) MCG/ACT inhaler Inhale 2 puffs into the lungs every 6 (six) hours as needed for wheezing or shortness of breath. 8 g 2   allopurinol (ZYLOPRIM) 300 MG tablet Take 300 mg by mouth daily.     ALPRAZolam (XANAX) 0.25 MG tablet Take 1 tablet (0.25 mg total) by mouth 2 (two) times daily as needed for anxiety. 60 tablet 3   apixaban (ELIQUIS) 2.5 MG TABS tablet Take 1 tablet (2.5 mg total) by mouth 2 (two) times daily. 60 tablet 0   cholecalciferol (VITAMIN D3) 25 MCG (1000 UNIT) tablet Take 1,000 Units by mouth daily.     clopidogrel (PLAVIX) 75 MG tablet Take 1 tablet (75 mg total) by mouth daily. 30 tablet 0   Cyanocobalamin (VITAMIN B 12 PO) Take 1 tablet by mouth daily.     ferrous sulfate 325 (65 FE) MG tablet Take 1 tablet (325 mg total) by mouth 2 (two) times daily with a meal. 60 tablet 3   folic  acid (FOLVITE) 1 MG tablet Take 1 tablet (1 mg total) by mouth daily. 30 tablet 0   gabapentin (NEURONTIN) 300 MG capsule Take 1 capsule (300 mg total) by mouth at bedtime. 30 capsule 0   hydrALAZINE (APRESOLINE) 25 MG tablet Take 1 tablet (25 mg total) by mouth 2 (two) times daily. 60 tablet 1   insulin glargine (LANTUS SOLOSTAR) 100 UNIT/ML Solostar Pen Inject 20 Units into the skin daily. Dose per sliding scale.     Insulin Pen Needle 32G X 4 MM MISC Use to inject insulin up to 4 times daily. 100 each 0   ipratropium-albuterol (DUONEB) 0.5-2.5 (3) MG/3ML SOLN Take 3 mLs by nebulization 3 (three) times daily. 360 mL    isosorbide dinitrate (ISORDIL) 20 MG tablet Take 1 tablet (20 mg total) by mouth 2 (two) times daily. 60 tablet 0   metolazone (ZAROXOLYN) 2.5 MG tablet Take 1 tablet (2.5 mg total) by mouth  once a week. On Saturdays 5 tablet 0   metoprolol succinate (TOPROL-XL) 50 MG 24 hr tablet Take 1 tablet (50 mg total) by mouth daily. Take with or immediately following a meal.     mometasone-formoterol (DULERA) 200-5 MCG/ACT AERO Inhale 2 puffs into the lungs in the morning and at bedtime. 1 each    nitroGLYCERIN (NITROSTAT) 0.4 MG SL tablet      pantoprazole (PROTONIX) 40 MG tablet Take 40 mg by mouth daily with breakfast.     potassium chloride SA (KLOR-CON M) 20 MEQ tablet Take 1 tablet (20 mEq total) by mouth daily. 30 tablet 0   rosuvastatin (CRESTOR) 10 MG tablet Take 1 tablet (10 mg total) by mouth every evening. 30 tablet 0   sertraline (ZOLOFT) 25 MG tablet Take 1 tablet (25 mg total) by mouth every evening. 90 tablet 0   tamsulosin (FLOMAX) 0.4 MG CAPS capsule Take 0.4 mg by mouth daily.     thiamine 100 MG tablet Take 1 tablet (100 mg total) by mouth daily. 30 tablet 0   torsemide (DEMADEX) 20 MG tablet Take 3 tablets (60 mg total) by mouth daily. and take extra 20mg  for 3lbs weight gain in 1 day or 5lbs in 1 week 60 tablet 0   No current facility-administered medications for this  visit.     Review of Systems    He denies chest pain, palpitations, dyspnea, pnd, orthopnea, n, v, dizziness, syncope, edema, weight gain, or early satiety. All other systems reviewed and are otherwise negative except as noted above.   Physical Exam    VS:  BP 126/60   Pulse 76   Ht 5\' 7"  (1.702 m)   Wt 180 lb 12.8 oz (82 kg)   SpO2 91%   BMI 28.32 kg/m  GEN: Well nourished, well developed, in no acute distress. HEENT: normal. Neck: Supple, no JVD, carotid bruits, or masses. Cardiac: RRR, no murmurs, rubs, or gallops. No clubbing, cyanosis, edema.  Radials/DP/PT 2+ and equal bilaterally.  Respiratory:  Respirations regular and unlabored, clear to auscultation bilaterally. GI: Soft, nontender,mildly distended, BS + x 4. MS: no deformity or atrophy. Skin: warm and dry, no rash. Neuro:  Strength and sensation are intact. Psych: Normal affect.  Accessory Clinical Findings    ECG personally reviewed by me today -NSR, 76 bpm, frequent PVCs,- no acute changes.  Lab Results  Component Value Date   WBC 6.0 03/31/2022   HGB 9.1 (L) 03/31/2022   HCT 29.7 (L) 03/31/2022   MCV 92.2 03/31/2022   PLT 200 03/31/2022   Lab Results  Component Value Date   CREATININE 2.42 (H) 04/10/2022   BUN 70 (H) 04/10/2022   NA 137 04/10/2022   K 3.5 04/10/2022   CL 98 04/10/2022   CO2 29 04/10/2022   Lab Results  Component Value Date   ALT 13 03/23/2022   AST 16 03/23/2022   ALKPHOS 51 03/23/2022   BILITOT 0.9 03/23/2022   Lab Results  Component Value Date   CHOL 99 12/24/2021   HDL 51 12/24/2021   LDLCALC 29 12/24/2021   TRIG 95 12/24/2021   CHOLHDL 1.9 12/24/2021    Lab Results  Component Value Date   HGBA1C 7.1 (H) 12/24/2021    Assessment & Plan    1. Chronic systolic heart failure/ICM: repeat limited echo in May 2023 showed EF 25 to 30%, moderately dilated LV, normal RV systolic function. Abdomen is mildly distended. He denies dyspnea, pnd, orthopnea, edema. Weight is  stable.  He is scheduled for follow-up with the advanced heart failure clinic on 04/28/19/2023.  Discussed sodium and fluid recommendations, ongoing monitoring with daily weights.  Continue hydralazine, isosorbide dinitrate, torsemide, metolazone, and metoprolol.  Recommend repeat BMET at next follow-up visit with AHF clinic.    2. CAD: S/p NSTEMI in March 2023.  Most recent cath in 2013 in [place] showed EF 40% with hypokinesis of the anterior wall, 30-40% mRCA, 30-40% mPDA, 80% pLAD, 70-80% mLAD, 90% LCx.  He underwent DES x2-LAD, medical therapy was recommended for the circumflex. Stable with no anginal symptoms. No indication for ischemic evaluation.  Continue Plavix, Crestor, and current medications as above.  3. Paroxysmal atrial fibrillation: Maintaining NSR.  Denies bleeding on Eliquis.  Denies palpitations.  Continue metoprolol, Eliquis.  4. Hypertension: BP well controlled. Continue current antihypertensive regimen.   5. Hyperlipidemia: LDL was 29 in March 2023.  Continue Crestor.  6. PVD: Follows at [facility].  7. CKD stage IV: Creatinine was 2.42 on 04/10/2022.  He will be due for repeat BMET at next follow-up visit with advanced heart failure clinic in 1 week.  8. OSA: Adherent to CPAP.  Denies any concerns today.  9. Type 2 diabetes: A1c was 7.1 in March 2023.  Monitor managed per PCP.  10. COPD: On chronic home O2.  Denies worsening dyspnea.  Stable.  Follows with pulmonology.  11. Disposition: Follow-up with advanced heart failure clinic as scheduled, follow-up with with Dr. [name] scheduled in 04/2022.   Lenna Sciara, NP 04/18/2022, 3:03 PM

## 2022-04-18 NOTE — Patient Instructions (Signed)
Medication Instructions:  Your physician recommends that you continue on your current medications as directed. Please refer to the Current Medication list given to you today.   *If you need a refill on your cardiac medications before your next appointment, please call your pharmacy*   Lab Work: NONE ordered at this time of appointment   If you have labs (blood work) drawn today and your tests are completely normal, you will receive your results only by: [place] (if you have MyChart) OR A paper copy in the mail If you have any lab test that is abnormal or we need to change your treatment, we will call you to review the results.   Testing/Procedures: NONE ordered at this time of appointment     Follow-Up: At [facility], you and your health needs are our priority.  As part of our continuing mission to provide you with exceptional heart care, we have created designated Provider Care Teams.  These Care Teams include your primary Cardiologist (physician) and Advanced Practice Providers (APPs -  Physician Assistants and Nurse Practitioners) who all work together to provide you with the care you need, when you need it.  We recommend signing up for the patient portal called "MyChart".  Sign up information is provided on this After Visit Summary.  MyChart is used to connect with patients for Virtual Visits (Telemedicine).  Patients are able to view lab/test results, encounter notes, upcoming appointments, etc.  Non-urgent messages can be sent to your provider as well.   To learn more about what you can do with MyChart, go to [email or web address].    Your next appointment:    Keep follow up appointments  The format for your next appointment:   In Person  Provider:   Kirk Ruths, MD     Other Instructions   Important Information About Sugar

## 2022-04-25 ENCOUNTER — Other Ambulatory Visit: Payer: Self-pay | Admitting: *Deleted

## 2022-04-25 NOTE — Patient Outreach (Signed)
[facility]) Care Management Geriatric Nurse Practitioner Note   04/25/2022 Name:  John [name] MRN:  [id number] DOB:  1941/03/29  Summary: Stable HF and COPD  Recommendations/Changes made from today's visit: Continue following your self maintenance HF and COPD  Subjective: John [name] is an 81 y.o. year old male who is a primary patient of [name], DO. The care management team was consulted for assistance with care management and/or care coordination needs.    Geriatric Nurse Practitioner completed Telephone Visit today.   Outpatient Encounter Medications as of 04/25/2022  Medication Sig   albuterol (VENTOLIN HFA) 108 (90 Base) MCG/ACT inhaler Inhale 2 puffs into the lungs every 6 (six) hours as needed for wheezing or shortness of breath.   allopurinol (ZYLOPRIM) 300 MG tablet Take 300 mg by mouth daily.   ALPRAZolam (XANAX) 0.25 MG tablet Take 1 tablet (0.25 mg total) by mouth 2 (two) times daily as needed for anxiety.   apixaban (ELIQUIS) 2.5 MG TABS tablet Take 1 tablet (2.5 mg total) by mouth 2 (two) times daily.   cholecalciferol (VITAMIN D3) 25 MCG (1000 UNIT) tablet Take 1,000 Units by mouth daily.   clopidogrel (PLAVIX) 75 MG tablet Take 1 tablet (75 mg total) by mouth daily.   Cyanocobalamin (VITAMIN B 12 PO) Take 1 tablet by mouth daily.   ferrous sulfate 325 (65 FE) MG tablet Take 1 tablet (325 mg total) by mouth 2 (two) times daily with a meal.   folic acid (FOLVITE) 1 MG tablet Take 1 tablet (1 mg total) by mouth daily.   gabapentin (NEURONTIN) 300 MG capsule Take 1 capsule (300 mg total) by mouth at bedtime.   hydrALAZINE (APRESOLINE) 25 MG tablet Take 1 tablet (25 mg total) by mouth 2 (two) times daily.   insulin glargine (LANTUS SOLOSTAR) 100 UNIT/ML Solostar Pen Inject 20 Units into the skin daily. Dose per sliding scale.   Insulin Pen Needle 32G X 4 MM MISC Use to inject insulin up to 4 times daily.   ipratropium-albuterol (DUONEB) 0.5-2.5 (3)  MG/3ML SOLN Take 3 mLs by nebulization 3 (three) times daily.   isosorbide dinitrate (ISORDIL) 20 MG tablet Take 1 tablet (20 mg total) by mouth 2 (two) times daily.   metolazone (ZAROXOLYN) 2.5 MG tablet Take 1 tablet (2.5 mg total) by mouth once a week. On Saturdays   metoprolol succinate (TOPROL-XL) 50 MG 24 hr tablet Take 1 tablet (50 mg total) by mouth daily. Take with or immediately following a meal.   mometasone-formoterol (DULERA) 200-5 MCG/ACT AERO Inhale 2 puffs into the lungs in the morning and at bedtime.   nitroGLYCERIN (NITROSTAT) 0.4 MG SL tablet    pantoprazole (PROTONIX) 40 MG tablet Take 40 mg by mouth daily with breakfast.   potassium chloride SA (KLOR-CON M) 20 MEQ tablet Take 1 tablet (20 mEq total) by mouth daily.   rosuvastatin (CRESTOR) 10 MG tablet Take 1 tablet (10 mg total) by mouth every evening.   sertraline (ZOLOFT) 25 MG tablet Take 1 tablet (25 mg total) by mouth every evening.   tamsulosin (FLOMAX) 0.4 MG CAPS capsule Take 0.4 mg by mouth daily.   thiamine 100 MG tablet Take 1 tablet (100 mg total) by mouth daily.   torsemide (DEMADEX) 20 MG tablet Take 3 tablets (60 mg total) by mouth daily. and take extra 20mg  for 3lbs weight gain in 1 day or 5lbs in 1 week   No facility-administered encounter medications on file as of 04/25/2022.   Care  Plan  Review of patient past medical history, allergies, medications, health status, including review of consultants reports, laboratory and other test data, was performed as part of comprehensive evaluation for care management services.   Pt will follow HF and COPD Action plans over the next 30 days to avoid hospitalization within the next 30 days.  Update 04-25-22:  (Status: Goal on Track (progressing): YES.) Short Term Goal  Evaluation of current treatment plan related to FOLLOWING THE HF AND COPD ACTION PLANS and patient's adherence to plan as established by provider Mr. Goodell reports he feels well. He has not sxs of HF or  COPD exacerbation. He is doing a great job monitoring his weight which is stable at 177-178#. He will go to the Advanced Heart and Vascular office this Friday.  Plan: Home visit scheduled for 05/02/22  Eulah Pont. Myrtie Neither, MSN, [facility] Gerontological Nurse Practitioner [facility] Care Management [phone]

## 2022-04-26 NOTE — Progress Notes (Addendum)
HEART & VASCULAR TRANSITION OF CARE CONSULT NOTE     Referring Physician: Dr Broadus John Primary Care: Dr Francesco Sor Primary Cardiologist: Dr [name]    HPI: Referred to clinic by Dr [name]  for heart failure consultation.   John [name] is a 81 y.o. male with a history of chronic 4L O2 dependence, COPD, asbestosis, combined HFrEF, PAF, CAD , T2DM, HTN, HLD, PAD followed at [facility], history of CVA, and stage IV CKD .   Had CAD with prior MI 40 years ago. Cardiac catheterization in June 2013 in [place] with PCI/placement of 2 DES to the LAD. There was residual 30-40% mRCA, 30-40% mPDA, 90% LCx. EF was 40%.    Patient admitted with NSTEMI, a/c CHF and new AF in 12/2021, however medical management was pursued due to AKI. Echocardiogram 12/25/21: EF 35%, mild LVH, grade II diastolic dysfunction, normal RV systolic function. Continued on carvedilol, hydralazine, isordil. Was not discharged on diuretic given AKI and possible ATN. Creatinine was 3.18. Discharged on 03/10.   Patient seen by cardiology on 01/09/22 for increasing SOB and weight gain. Was diuresed with IV lasix, discharged on oral demadex on 3/23. At his follow up appointment on 01/23/22, patient was euvolemic.  He did complain of some dyspnea on exertion, discussed cardiac catheterization but decided to continue medical management of CAD.   On 01/26/22 he was seen at [facility] with increased shortness of breath and admitted.  O2 sats in the 60s. Limited echo: 01/26/22 EF 30-35%. Patient underwent thoracentesis and was diuresed. Discharged 02/05/22.  Discharged on torsemide 40 mg daily.    Readmitted to [facility] 03/06/22 with A/C HFrEF and AECOPD. Limited echocardiogram 03/08/22: EF 25-30%, moderately dilated LV, RV okay. Patient was diuresed and discharged on demadex 40 mg BID, toprol-xl, hydralazine, isordil. Discharged 03/12/22 . Discharge weight 182 pounds.   Admitted 03/23/22 with volume overload. Diuresed with IV lasix and transitioned to torsemide 40 mg daily.  GDMT limited by CKD. Discharged 03/31/22 with weight 176 pounds.   Seen in [facility] clinic for hospital follow-up 04/10/22. Home weight 178-180 lb. Appeared volume up. Had been taking extra torsemide at home. Torsemide increased to 60 mg daily and instructed to take 2.5 mg metolazone once a week on Saturdays.   He saw Diona Browner, NP with [facility] on 06/28. Volume looked okay. No changes made to regimen.  He is here today for f/u. He is accompanied by his daughter who assists with providing the history. He has been doing well. Denies dyspnea, orthopnea, PND or LE edema. No significant abdominal bloating. Home weight has been stable between 178-180 lb. He has not been out as much for breakfast. His daughter prepares most meals at home. Limiting fluid intake.   Cardiac Testing  Echo 02/2022 EF 25-30%.   Echo 12/2021 EF 35%  Echo 08/2021 40-45%   RHC 03/2022  RA 11 PA 59/23 (41)  PCWP 23 CO 5.3 CI 2.7    Review of Systems: Cardiac and Respiratory. Negative except as mentioned in HPI.   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)    BPH (benign prostatic hyperplasia)    CAD (coronary artery disease)    CHF (congestive heart failure) (HCC)    Cholesteatoma of attic    Depression    Diabetes mellitus ([place])    Ear disease    GERD (gastroesophageal reflux disease)    Hyperlipidemia    Hypertension    PVD (peripheral vascular disease) ([place])  Stroke [facility])     Current Outpatient Medications  Medication Sig Dispense Refill   albuterol (VENTOLIN HFA) 108 (90 Base) MCG/ACT inhaler Inhale 2 puffs into the lungs every 6 (six) hours as needed for wheezing or shortness of breath. 8 g 2   allopurinol (ZYLOPRIM) 300 MG tablet Take 300 mg by mouth daily.     ALPRAZolam (XANAX) 0.25 MG tablet Take 1 tablet (0.25 mg total) by mouth 2 (two) times daily as needed for anxiety. 60 tablet 3   apixaban (ELIQUIS) 2.5 MG TABS tablet Take 1 tablet (2.5 mg total) by mouth 2 (two)  times daily. 60 tablet 0   cholecalciferol (VITAMIN D3) 25 MCG (1000 UNIT) tablet Take 1,000 Units by mouth daily.     clopidogrel (PLAVIX) 75 MG tablet Take 1 tablet (75 mg total) by mouth daily. 30 tablet 0   Cyanocobalamin (VITAMIN B 12 PO) Take 1 tablet by mouth daily.     ferrous sulfate 325 (65 FE) MG tablet Take 1 tablet (325 mg total) by mouth 2 (two) times daily with a meal. 60 tablet 3   folic acid (FOLVITE) 1 MG tablet Take 1 tablet (1 mg total) by mouth daily. 30 tablet 0   gabapentin (NEURONTIN) 300 MG capsule Take 1 capsule (300 mg total) by mouth at bedtime. 30 capsule 0   hydrALAZINE (APRESOLINE) 25 MG tablet Take 1 tablet (25 mg total) by mouth 2 (two) times daily. 60 tablet 1   insulin glargine (LANTUS SOLOSTAR) 100 UNIT/ML Solostar Pen Inject 20 Units into the skin daily. Dose per sliding scale.     Insulin Pen Needle 32G X 4 MM MISC Use to inject insulin up to 4 times daily. 100 each 0   ipratropium-albuterol (DUONEB) 0.5-2.5 (3) MG/3ML SOLN Take 3 mLs by nebulization 3 (three) times daily. 360 mL    isosorbide dinitrate (ISORDIL) 20 MG tablet Take 1 tablet (20 mg total) by mouth 2 (two) times daily. 60 tablet 0   metolazone (ZAROXOLYN) 2.5 MG tablet Take 1 tablet (2.5 mg total) by mouth once a week. On Saturdays 5 tablet 0   metoprolol succinate (TOPROL-XL) 50 MG 24 hr tablet Take 1 tablet (50 mg total) by mouth daily. Take with or immediately following a meal.     mometasone-formoterol (DULERA) 200-5 MCG/ACT AERO Inhale 2 puffs into the lungs in the morning and at bedtime. 1 each    pantoprazole (PROTONIX) 40 MG tablet Take 40 mg by mouth daily with breakfast.     potassium chloride SA (KLOR-CON M) 20 MEQ tablet Take 1 tablet (20 mEq total) by mouth daily. 30 tablet 0   rosuvastatin (CRESTOR) 10 MG tablet Take 1 tablet (10 mg total) by mouth every evening. 30 tablet 0   sertraline (ZOLOFT) 25 MG tablet Take 1 tablet (25 mg total) by mouth every evening. 90 tablet 0    tamsulosin (FLOMAX) 0.4 MG CAPS capsule Take 0.4 mg by mouth daily.     thiamine 100 MG tablet Take 1 tablet (100 mg total) by mouth daily. 30 tablet 0   torsemide (DEMADEX) 20 MG tablet Take 3 tablets (60 mg total) by mouth daily. and take extra 20mg  for 3lbs weight gain in 1 day or 5lbs in 1 week 60 tablet 0   nitroGLYCERIN (NITROSTAT) 0.4 MG SL tablet  (Patient not taking: Reported on 04/27/2022)     No current facility-administered medications for this encounter.    Allergies  Allergen Reactions   Brilinta [Ticagrelor] Shortness Of  Breath   Penicillins Hives   Ezetimibe-Simvastatin Other (See Comments)    Myalgia       Social History   Socioeconomic History   Marital status: Widowed    Spouse name: Not on file   Number of children: 3   Years of education: Not on file   Highest education level: Not on file  Occupational History   Occupation: retired  Tobacco Use   Smoking status: Former    Types: Cigarettes    Quit date: 05/19/1989    Years since quitting: 32.9   Smokeless tobacco: Never  Vaping Use   Vaping Use: Never used  Substance and Sexual Activity   Alcohol use: Not Currently   Drug use: No   Sexual activity: Not on file  Other Topics Concern   Not on file  Social History Narrative   Not on file   Social Determinants of Health   Financial Resource Strain: Low Risk  (04/02/2022)   Overall Financial Resource Strain (CARDIA)    Difficulty of Paying Living Expenses: Not hard at all  Food Insecurity: No Food Insecurity (03/28/2022)   Hunger Vital Sign    Worried About Running Out of Food in the Last Year: Never true    [place] in the Last Year: Never true  Transportation Needs: No Transportation Needs (03/28/2022)   PRAPARE - Hydrologist (Medical): No    Lack of Transportation (Non-Medical): No  Physical Activity: Inactive (04/02/2022)   Exercise Vital Sign    Days of Exercise per Week: 0 days    Minutes of Exercise per  Session: 0 min  Stress: Stress Concern Present (04/02/2022)   [place]    Feeling of Stress : To some extent  Social Connections: Not on file  Intimate Partner Violence: Not on file      Family History  Problem Relation Age of Onset   Diabetes Other    Diabetes Mother    Diabetes Maternal Aunt    Depression Other    Depression Other    Drug abuse Daughter    Diabetes Daughter     Vitals:   04/27/22 1054  BP: (!) 106/48  Pulse: 77  SpO2: 90%  Weight: 82.6 kg (182 lb)    Wt Readings from Last 3 Encounters:  04/27/22 82.6 kg (182 lb)  04/18/22 82 kg (180 lb 12.8 oz)  04/10/22 83.5 kg (184 lb)     PHYSICAL EXAM: General:  Ambulated into clinic with cane. Daughter present. HEENT: normal Neck: supple. JVP ~ 8 cm.  Cor: PMI nondisplaced. Regular rate & rhythm. No rubs, gallops or murmurs. Lungs: clear on 4L O2 [place] Abdomen: soft, nontender, mildly distended.  Extremities: no cyanosis, clubbing, rash, edema Neuro: alert & orientedx3, cranial nerves grossly intact. moves all 4 extremities w/o difficulty. Affect pleasant    ASSESSMENT & PLAN: 1. HFrEF, ICM -Echo 03/22: EF 50-55% -Echo 11/22: EF 40-45%  -Echo 03/23 in setting of NSTEMI: EF 35%, RV okay -Cath was not pursued to due to CKD Stage IV. He has had previous stents to LAD.  -Limited echo 05/23: EF 25-30%, LV severely depressed with hypokinesis worse in lateral, anterior and apical walls, RV okay -Multiple admissions for a/c CHF over the last few months. Reinforced fluid and sodium restriction. Has been doing better with limiting sodium intake. NYHA III GDMT  Diuretic-Torsemide 60 mg daily, 2.5 mg metolazone once weekly on Saturday. Can  take extra 2.5 mg metolazone PRN for weight gain or worsening CHF symptoms. Discussed when to do this. BB-Metoprolol xl 50 mg daily Ace/ARB/ARNI- No due to CKD Stage IV MRA- No CKD Stage IV  SGLT2i- considered  but concerned about hygiene with advanced age and mobility, GFR borderline -Continue hydralazine 25 BID and isordil 20 mg BID. No blood pressure room to titrate.  2. CAD -Previous stents 2013 to LAD.  -NSTEMI 03/23. Managed medically. -No chest pain.  -On eliquis and plavix. Continue Rosuvastatin.  3. CKD Stage IV -Creatinine baseline variable but looks like ~ 2.3-2.5.  -CMET and BNP today   4. PAF -SR with PVCs on ECG at f/u 06/28 -Regular on exam today -On Eliquis 2.5 BID (appropriate dose given age > 80 and Scr > 1.5) -Hgb averages 8s-9s. Denies bleeding issues.  5. OSA -Severe -Using CPAP  Referred to HFSW (PCP, Medications, Transportation, ETOH Abuse, Drug Abuse, Insurance, Financial ): No Refer to Pharmacy:  No Refer to Home Health: No, followed by [employer].  Refer to Advanced Heart Failure Clinic: No  Refer to General Cardiology: No, he is established with Dr. [name]  He would benefit from Palliative Care referral for goals of care discussion.   Follow-up: PRN, F/u with Dr. [name] as scheduled 05/15/22  Evgenia Merriman N PA-C 12:12 PM

## 2022-04-27 ENCOUNTER — Telehealth (HOSPITAL_COMMUNITY): Payer: Self-pay | Admitting: *Deleted

## 2022-04-27 ENCOUNTER — Ambulatory Visit (HOSPITAL_COMMUNITY)
Admission: RE | Admit: 2022-04-27 | Discharge: 2022-04-27 | Disposition: A | Discharge status: Home / Self Care | Payer: Medicare Other | Source: Ambulatory Visit | Attending: Physician Assistant | Admitting: Physician Assistant

## 2022-04-27 ENCOUNTER — Encounter (HOSPITAL_COMMUNITY): Payer: Self-pay

## 2022-04-27 VITALS — BP 106/48 | HR 77 | Wt 182.0 lb

## 2022-04-27 DIAGNOSIS — N184 Chronic kidney disease, stage 4 (severe): Secondary | ICD-10-CM

## 2022-04-27 DIAGNOSIS — Z955 Presence of coronary angioplasty implant and graft: Secondary | ICD-10-CM | POA: Diagnosis not present

## 2022-04-27 DIAGNOSIS — Z9981 Dependence on supplemental oxygen: Secondary | ICD-10-CM | POA: Diagnosis not present

## 2022-04-27 DIAGNOSIS — Z8673 Personal history of transient ischemic attack (TIA), and cerebral infarction without residual deficits: Secondary | ICD-10-CM | POA: Insufficient documentation

## 2022-04-27 DIAGNOSIS — I2510 Atherosclerotic heart disease of native coronary artery without angina pectoris: Secondary | ICD-10-CM | POA: Diagnosis not present

## 2022-04-27 DIAGNOSIS — Z79899 Other long term (current) drug therapy: Secondary | ICD-10-CM | POA: Diagnosis not present

## 2022-04-27 DIAGNOSIS — I130 Hypertensive heart and chronic kidney disease with heart failure and stage 1 through stage 4 chronic kidney disease, or unspecified chronic kidney disease: Secondary | ICD-10-CM | POA: Diagnosis present

## 2022-04-27 DIAGNOSIS — I5022 Chronic systolic (congestive) heart failure: Secondary | ICD-10-CM | POA: Diagnosis not present

## 2022-04-27 DIAGNOSIS — G4733 Obstructive sleep apnea (adult) (pediatric): Secondary | ICD-10-CM | POA: Diagnosis not present

## 2022-04-27 DIAGNOSIS — I5020 Unspecified systolic (congestive) heart failure: Secondary | ICD-10-CM | POA: Diagnosis not present

## 2022-04-27 DIAGNOSIS — Z7901 Long term (current) use of anticoagulants: Secondary | ICD-10-CM | POA: Insufficient documentation

## 2022-04-27 DIAGNOSIS — E785 Hyperlipidemia, unspecified: Secondary | ICD-10-CM | POA: Insufficient documentation

## 2022-04-27 DIAGNOSIS — I252 Old myocardial infarction: Secondary | ICD-10-CM | POA: Insufficient documentation

## 2022-04-27 DIAGNOSIS — E1151 Type 2 diabetes mellitus with diabetic peripheral angiopathy without gangrene: Secondary | ICD-10-CM | POA: Insufficient documentation

## 2022-04-27 DIAGNOSIS — I480 Paroxysmal atrial fibrillation: Secondary | ICD-10-CM | POA: Insufficient documentation

## 2022-04-27 DIAGNOSIS — I493 Ventricular premature depolarization: Secondary | ICD-10-CM | POA: Insufficient documentation

## 2022-04-27 DIAGNOSIS — I255 Ischemic cardiomyopathy: Secondary | ICD-10-CM | POA: Diagnosis not present

## 2022-04-27 DIAGNOSIS — E1122 Type 2 diabetes mellitus with diabetic chronic kidney disease: Secondary | ICD-10-CM | POA: Diagnosis present

## 2022-04-27 LAB — COMPREHENSIVE METABOLIC PANEL
ALT: 9 U/L (ref 0–44)
AST: 15 U/L (ref 15–41)
Albumin: 3.1 g/dL — ABNORMAL LOW (ref 3.5–5.0)
Alkaline Phosphatase: 49 U/L (ref 38–126)
Anion gap: 11 (ref 5–15)
BUN: 58 mg/dL — ABNORMAL HIGH (ref 8–23)
CO2: 30 mmol/L (ref 22–32)
Calcium: 9.2 mg/dL (ref 8.9–10.3)
Chloride: 94 mmol/L — ABNORMAL LOW (ref 98–111)
Creatinine, Ser: 2.33 mg/dL — ABNORMAL HIGH (ref 0.61–1.24)
GFR, Estimated: 27 mL/min — ABNORMAL LOW (ref >60)
Glucose, Bld: 211 mg/dL — ABNORMAL HIGH (ref 70–99)
Potassium: 4.3 mmol/L (ref 3.5–5.1)
Sodium: 135 mmol/L (ref 135–145)
Total Bilirubin: 0.7 mg/dL (ref 0.3–1.2)
Total Protein: 6 g/dL — ABNORMAL LOW (ref 6.5–8.1)

## 2022-04-27 LAB — BRAIN NATRIURETIC PEPTIDE: B Natriuretic Peptide: 891.7 pg/mL — ABNORMAL HIGH (ref 0.0–100.0)

## 2022-04-27 NOTE — Patient Instructions (Signed)
It was great to see you today! No medication changes are needed at this time.  Thank you for allowing [place] to provider your heart failure care after your recent hospitalization. Please follow-up with Dr [name] as scheduled.       Do the following things EVERYDAY: Weigh yourself in the morning before breakfast. Write it down and keep it in a log. Take your medicines as prescribed Eat low salt foods--Limit salt (sodium) to 2000 mg per day.  Stay as active as you can everyday Limit all fluids for the day to less than 2 liters

## 2022-04-27 NOTE — Telephone Encounter (Signed)
Call attempted to confirm HV TOC appt 10;30 am on 04/27/22. HIPPA appropriate VM left with callback number.    Earnestine Leys, BSN, Clinical cytogeneticist Only

## 2022-05-02 ENCOUNTER — Ambulatory Visit: Payer: Self-pay | Admitting: *Deleted

## 2022-05-02 NOTE — Patient Outreach (Signed)
  Care Coordination   Home Visit Note   05/02/2022 Name: John [name] MRN: [id number] DOB: 1941/10/07  John [name] is a 81 y.o. year old male who sees [name], [place] for primary care. I visited  John [name] in their home today.  What matters to the patients health and wellness today?  Walking. Just discharged from PT. Given home exercises that he is doing daily. He reports he does have sxs of claudication (hx of fem/pop bypass). When he walks for any extended distance. He denies SOB with ambulation. He has no edema. HF and COPD stable.   Goals Addressed   None    SDOH assessments and interventions completed:   Yes Previously assessed.  Care Coordination Interventions Activated:  Yes  Care Coordination Interventions:   Yes, provided 1) Encouraged continued daily exercises. 2) Reinforced fall precautions. Call NP or MD with any health issue early to avoid complications.  Follow up plan:  Home visit in one month.  Encounter Outcome:  Pt. Visit Completed  Kayleen Memos C. Myrtie Neither, MSN, [facility] Gerontological Nurse Practitioner [facility] Care Management [phone]

## 2022-05-03 NOTE — Progress Notes (Deleted)
HPI: FU CAD and chronic diastolic CHF. Patient states he suffered a myocardial infarction at age 81 in [place]. He had a cardiac catheterization but medical therapy was recommended. Cardiac catheterization in June of 2013 in [place] showed EF 40% and there was hypokinesis of the anterior wall. There was a 30-40% mid right coronary artery and a 30-40% mid PDA. The left main was normal. There was an 80% proximal LAD and a 70-80% mid lesion. The circumflex had a 90% lesion after the origin of a first marginal but continued mainly as a moderate size atrial branch. The patient had PCI of his LAD with 2 Ion drug-eluting stents. Medical therapy recommended for the circumflex.  Also with peripheral vascular disease with history of femoropopliteal August 2019.  Patient admitted March 2023 with non-ST elevation myocardial infarction and congestive heart failure.  Also new onset atrial fibrillation.  Because of significant renal insufficiency cardiac catheterization was not pursued.  He was readmitted with recurrent CHF later in the same month.  Echocardiogram May 2023 showed ejection fraction 25 to 30%, moderate left ventricular enlargement, mild left ventricular hypertrophy.  Hospitalized June 2023 with acute CHF.  Right heart catheterization June 2023 showed pulmonary capillary wedge pressure of 23 with PA pressure 59/23.  Since last seen   Current Outpatient Medications  Medication Sig Dispense Refill   albuterol (VENTOLIN HFA) 108 (90 Base) MCG/ACT inhaler Inhale 2 puffs into the lungs every 6 (six) hours as needed for wheezing or shortness of breath. 8 g 2   allopurinol (ZYLOPRIM) 300 MG tablet Take 300 mg by mouth daily.     ALPRAZolam (XANAX) 0.25 MG tablet Take 1 tablet (0.25 mg total) by mouth 2 (two) times daily as needed for anxiety. 60 tablet 3   apixaban (ELIQUIS) 2.5 MG TABS tablet Take 1 tablet (2.5 mg total) by mouth 2 (two) times daily. 60 tablet 0   cholecalciferol (VITAMIN D3) 25  MCG (1000 UNIT) tablet Take 1,000 Units by mouth daily.     clopidogrel (PLAVIX) 75 MG tablet Take 1 tablet (75 mg total) by mouth daily. 30 tablet 0   Cyanocobalamin (VITAMIN B 12 PO) Take 1 tablet by mouth daily.     ferrous sulfate 325 (65 FE) MG tablet Take 1 tablet (325 mg total) by mouth 2 (two) times daily with a meal. 60 tablet 3   folic acid (FOLVITE) 1 MG tablet Take 1 tablet (1 mg total) by mouth daily. 30 tablet 0   gabapentin (NEURONTIN) 300 MG capsule Take 1 capsule (300 mg total) by mouth at bedtime. 30 capsule 0   hydrALAZINE (APRESOLINE) 25 MG tablet Take 1 tablet (25 mg total) by mouth 2 (two) times daily. 60 tablet 1   insulin glargine (LANTUS SOLOSTAR) 100 UNIT/ML Solostar Pen Inject 20 Units into the skin daily. Dose per sliding scale.     Insulin Pen Needle 32G X 4 MM MISC Use to inject insulin up to 4 times daily. 100 each 0   ipratropium-albuterol (DUONEB) 0.5-2.5 (3) MG/3ML SOLN Take 3 mLs by nebulization 3 (three) times daily. 360 mL    isosorbide dinitrate (ISORDIL) 20 MG tablet Take 1 tablet (20 mg total) by mouth 2 (two) times daily. 60 tablet 0   metolazone (ZAROXOLYN) 2.5 MG tablet Take 1 tablet (2.5 mg total) by mouth once a week. On Saturdays 5 tablet 0   metoprolol succinate (TOPROL-XL) 50 MG 24 hr tablet Take 1 tablet (50 mg total) by mouth daily.  Take with or immediately following a meal.     mometasone-formoterol (DULERA) 200-5 MCG/ACT AERO Inhale 2 puffs into the lungs in the morning and at bedtime. 1 each    nitroGLYCERIN (NITROSTAT) 0.4 MG SL tablet  (Patient not taking: Reported on 04/27/2022)     pantoprazole (PROTONIX) 40 MG tablet Take 40 mg by mouth daily with breakfast.     potassium chloride SA (KLOR-CON M) 20 MEQ tablet Take 1 tablet (20 mEq total) by mouth daily. 30 tablet 0   rosuvastatin (CRESTOR) 10 MG tablet Take 1 tablet (10 mg total) by mouth every evening. 30 tablet 0   sertraline (ZOLOFT) 25 MG tablet Take 1 tablet (25 mg total) by mouth every  evening. 90 tablet 0   tamsulosin (FLOMAX) 0.4 MG CAPS capsule Take 0.4 mg by mouth daily.     thiamine 100 MG tablet Take 1 tablet (100 mg total) by mouth daily. 30 tablet 0   torsemide (DEMADEX) 20 MG tablet Take 3 tablets (60 mg total) by mouth daily. and take extra 20mg  for 3lbs weight gain in 1 day or 5lbs in 1 week 60 tablet 0   No current facility-administered medications for this visit.     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)    BPH (benign prostatic hyperplasia)    CAD (coronary artery disease)    CHF (congestive heart failure) (HCC)    Cholesteatoma of attic    Depression    Diabetes mellitus ([place])    Ear disease    GERD (gastroesophageal reflux disease)    Hyperlipidemia    Hypertension    PVD (peripheral vascular disease) ([place])    Stroke [facility])     Past Surgical History:  Procedure Laterality Date   BONE ANCHORED HEARING AID IMPLANT Left 06/25/2013   Procedure: BONE ANCHORED HEARING AID (BAHA) IMPLANT;  Surgeon: Fannie Knee, MD;  Location: [facility];  Service: ENT;  Laterality: Left;   CAROTID STENT INSERTION Left    COCHLEAR IMPLANT     CORONARY STENT PLACEMENT     MASS EXCISION Left 06/25/2013   Procedure: EXCISION LEFT TEMPORAL MASS;  Surgeon: Fannie Knee, MD;  Location: [place];  Service: ENT;  Laterality: Left;   RIGHT HEART CATH N/A 03/26/2022   Procedure: RIGHT HEART CATH;  Surgeon: Lorretta Harp, MD;  Location: [facility];  Service: Cardiovascular;  Laterality: N/A;   TONSILLECTOMY      Social History   Socioeconomic History   Marital status: Widowed    Spouse name: Not on file   Number of children: 3   Years of education: Not on file   Highest education level: Not on file  Occupational History   Occupation: retired  Tobacco Use   Smoking status: Former    Types: Cigarettes    Quit date: 05/19/1989    Years since quitting: 32.9   Smokeless tobacco: Never  Vaping Use    Vaping Use: Never used  Substance and Sexual Activity   Alcohol use: Not Currently   Drug use: No   Sexual activity: Not on file  Other Topics Concern   Not on file  Social History Narrative   Not on file   Social Determinants of Health   Financial Resource Strain: Low Risk  (04/02/2022)   Overall Financial Resource Strain (CARDIA)    Difficulty of Paying Living Expenses: Not hard at all  Food Insecurity: No Food  Insecurity (03/28/2022)   Hunger Vital Sign    Worried About Running Out of Food in the Last Year: Never true    Ran Out of Food in the Last Year: Never true  Transportation Needs: No Transportation Needs (03/28/2022)   PRAPARE - Hydrologist (Medical): No    Lack of Transportation (Non-Medical): No  Physical Activity: Inactive (04/02/2022)   Exercise Vital Sign    Days of Exercise per Week: 0 days    Minutes of Exercise per Session: 0 min  Stress: Stress Concern Present (04/02/2022)   [place]    Feeling of Stress : To some extent  Social Connections: Not on file  Intimate Partner Violence: Not on file    Family History  Problem Relation Age of Onset   Diabetes Other    Diabetes Mother    Diabetes Maternal Aunt    Depression Other    Depression Other    Drug abuse Daughter    Diabetes Daughter     ROS: no fevers or chills, productive cough, hemoptysis, dysphasia, odynophagia, melena, hematochezia, dysuria, hematuria, rash, seizure activity, orthopnea, PND, pedal edema, claudication. Remaining systems are negative.  Physical Exam: Well-developed well-nourished in no acute distress.  Skin is warm and dry.  HEENT is normal.  Neck is supple.  Chest is clear to auscultation with normal expansion.  Cardiovascular exam is regular rate and rhythm.  Abdominal exam nontender or distended. No masses palpated. Extremities show no edema. neuro grossly intact  ECG- personally  reviewed  A/P  1 chronic systolic congestive heart failure/ischemic cardiomyopathy-patient appears to be euvolemic on examination.  Continue diuretics at present dose.  He is not on ARB or Entresto due to chronic stage IV kidney disease, not on spironolactone or SGLT2 inhibitor due to chronic stage IV kidney disease.  Continue hydralazine and nitrates.  Continue beta-blocker.  2 coronary artery disease-patient denies chest pain.  Continue Plavix and apixaban.  Continue Crestor.  Note cardiac catheterization was not pursued previously due to risk of contrast nephropathy.  3 chronic stage IV kidney disease-we will recheck potassium and renal function today.  4 history of paroxysmal atrial fibrillation-he is in sinus rhythm on examination today.  Continue metoprolol and apixaban.  5 hypertension-blood pressure is controlled.  Continue present medications.  6 hyperlipidemia-continue statin.  7 peripheral vascular disease-followed at [facility].  Kirk Ruths, MD

## 2022-05-04 ENCOUNTER — Other Ambulatory Visit: Payer: Self-pay | Admitting: *Deleted

## 2022-05-04 ENCOUNTER — Telehealth: Payer: Self-pay | Admitting: Cardiology

## 2022-05-04 NOTE — Telephone Encounter (Signed)
Called patient, advised of message from MD.  Patient verbalized understanding.   

## 2022-05-04 NOTE — Telephone Encounter (Signed)
Called patient, advised that from review the only options for airplane use would be a POC (portable oxygen concentrator) patient states he gets his oxygen tank from his PCP through [employer]. I did advise that he could contact his PCP office and see what they recommend, but he still asked that I send the message over to Dr.Crenshaw to review. Thanks!

## 2022-05-04 NOTE — Patient Outreach (Signed)
[facility]) Care Management  05/04/2022  Dareion Kneece 01-20-1941 [id number]  Mr. Ivery called requesting assistance in arranging for oxygen for his long flight to [facility]. He is flying on [place] with his family. He has already make arrangements for oxygen when he arrives in [place].  Called O2 provider currently getting supplies from [facility]) Barnett Applebaum, suggested pt look into getting a POC (portable oxygen concentrator or a TOC (transportable O2 concentrator. Pt was previously denied this due to his continuous O2 need.  Investigated [employer] and sent pt via text all the information he requested. The airline can provide inflight O2 or the POC is acceptable. No mention of the TOC.  Pt would have to request a TOC from his MD and there is a Medical Form that he pt must carry with him on the flight.  Pt knows to contact me with any further inquiries.  In addition NP sent a text message to Dominica Severin, ANP with [facility] to advise her that Mr. Chalker and I made a deal. NP to follow him as long as he stays out of the hospital but if he readmits this NP will transition pt to [facility] and palliative care.  Eulah Pont. Myrtie Neither, MSN, GNP-BC Gerontological Nurse Practitioner [facility] Care Management [phone] .

## 2022-05-04 NOTE — Telephone Encounter (Signed)
Patient wants to travel on airplane but do not think he is allowed to carry oxygen tank on airplane. Wants to talk with Dr. [name] or nurse to see what other alternative he have to travel long distance

## 2022-05-13 ENCOUNTER — Encounter (HOSPITAL_COMMUNITY): Payer: Self-pay | Admitting: Emergency Medicine

## 2022-05-13 ENCOUNTER — Inpatient Hospital Stay (HOSPITAL_COMMUNITY)
Admission: EM | Admit: 2022-05-13 | Discharge: 2022-05-21 | DRG: 291 | Disposition: A | Discharge status: Hospice | Payer: Medicare Other | Attending: Internal Medicine | Admitting: Internal Medicine

## 2022-05-13 ENCOUNTER — Other Ambulatory Visit: Payer: Self-pay

## 2022-05-13 ENCOUNTER — Emergency Department (HOSPITAL_COMMUNITY): Payer: Medicare Other

## 2022-05-13 DIAGNOSIS — Z79899 Other long term (current) drug therapy: Secondary | ICD-10-CM

## 2022-05-13 DIAGNOSIS — I130 Hypertensive heart and chronic kidney disease with heart failure and stage 1 through stage 4 chronic kidney disease, or unspecified chronic kidney disease: Secondary | ICD-10-CM | POA: Diagnosis not present

## 2022-05-13 DIAGNOSIS — N400 Enlarged prostate without lower urinary tract symptoms: Secondary | ICD-10-CM | POA: Diagnosis present

## 2022-05-13 DIAGNOSIS — Z66 Do not resuscitate: Secondary | ICD-10-CM | POA: Diagnosis present

## 2022-05-13 DIAGNOSIS — R9431 Abnormal electrocardiogram [ECG] [EKG]: Secondary | ICD-10-CM | POA: Diagnosis present

## 2022-05-13 DIAGNOSIS — J449 Chronic obstructive pulmonary disease, unspecified: Secondary | ICD-10-CM | POA: Diagnosis present

## 2022-05-13 DIAGNOSIS — N189 Chronic kidney disease, unspecified: Secondary | ICD-10-CM | POA: Diagnosis present

## 2022-05-13 DIAGNOSIS — N184 Chronic kidney disease, stage 4 (severe): Secondary | ICD-10-CM | POA: Diagnosis present

## 2022-05-13 DIAGNOSIS — Z7901 Long term (current) use of anticoagulants: Secondary | ICD-10-CM

## 2022-05-13 DIAGNOSIS — E1151 Type 2 diabetes mellitus with diabetic peripheral angiopathy without gangrene: Secondary | ICD-10-CM | POA: Diagnosis present

## 2022-05-13 DIAGNOSIS — K219 Gastro-esophageal reflux disease without esophagitis: Secondary | ICD-10-CM | POA: Diagnosis present

## 2022-05-13 DIAGNOSIS — N179 Acute kidney failure, unspecified: Secondary | ICD-10-CM | POA: Diagnosis present

## 2022-05-13 DIAGNOSIS — D509 Iron deficiency anemia, unspecified: Secondary | ICD-10-CM | POA: Diagnosis present

## 2022-05-13 DIAGNOSIS — F32A Depression, unspecified: Secondary | ICD-10-CM | POA: Diagnosis present

## 2022-05-13 DIAGNOSIS — Z794 Long term (current) use of insulin: Secondary | ICD-10-CM

## 2022-05-13 DIAGNOSIS — I5043 Acute on chronic combined systolic (congestive) and diastolic (congestive) heart failure: Secondary | ICD-10-CM | POA: Diagnosis not present

## 2022-05-13 DIAGNOSIS — Z87891 Personal history of nicotine dependence: Secondary | ICD-10-CM

## 2022-05-13 DIAGNOSIS — E1140 Type 2 diabetes mellitus with diabetic neuropathy, unspecified: Secondary | ICD-10-CM | POA: Diagnosis present

## 2022-05-13 DIAGNOSIS — R319 Hematuria, unspecified: Secondary | ICD-10-CM | POA: Diagnosis not present

## 2022-05-13 DIAGNOSIS — Z880 Allergy status to penicillin: Secondary | ICD-10-CM

## 2022-05-13 DIAGNOSIS — Z8673 Personal history of transient ischemic attack (TIA), and cerebral infarction without residual deficits: Secondary | ICD-10-CM

## 2022-05-13 DIAGNOSIS — E871 Hypo-osmolality and hyponatremia: Secondary | ICD-10-CM | POA: Diagnosis present

## 2022-05-13 DIAGNOSIS — E876 Hypokalemia: Secondary | ICD-10-CM | POA: Diagnosis present

## 2022-05-13 DIAGNOSIS — J9621 Acute and chronic respiratory failure with hypoxia: Secondary | ICD-10-CM | POA: Diagnosis present

## 2022-05-13 DIAGNOSIS — I5023 Acute on chronic systolic (congestive) heart failure: Secondary | ICD-10-CM | POA: Diagnosis present

## 2022-05-13 DIAGNOSIS — G4733 Obstructive sleep apnea (adult) (pediatric): Secondary | ICD-10-CM | POA: Diagnosis present

## 2022-05-13 DIAGNOSIS — I25810 Atherosclerosis of coronary artery bypass graft(s) without angina pectoris: Secondary | ICD-10-CM | POA: Diagnosis present

## 2022-05-13 DIAGNOSIS — E785 Hyperlipidemia, unspecified: Secondary | ICD-10-CM | POA: Diagnosis present

## 2022-05-13 DIAGNOSIS — I493 Ventricular premature depolarization: Secondary | ICD-10-CM | POA: Diagnosis present

## 2022-05-13 DIAGNOSIS — Z955 Presence of coronary angioplasty implant and graft: Secondary | ICD-10-CM

## 2022-05-13 DIAGNOSIS — D649 Anemia, unspecified: Secondary | ICD-10-CM

## 2022-05-13 DIAGNOSIS — Z818 Family history of other mental and behavioral disorders: Secondary | ICD-10-CM

## 2022-05-13 DIAGNOSIS — Z515 Encounter for palliative care: Secondary | ICD-10-CM

## 2022-05-13 DIAGNOSIS — I255 Ischemic cardiomyopathy: Secondary | ICD-10-CM | POA: Diagnosis present

## 2022-05-13 DIAGNOSIS — I739 Peripheral vascular disease, unspecified: Secondary | ICD-10-CM | POA: Diagnosis present

## 2022-05-13 DIAGNOSIS — I480 Paroxysmal atrial fibrillation: Secondary | ICD-10-CM | POA: Diagnosis present

## 2022-05-13 DIAGNOSIS — I10 Essential (primary) hypertension: Secondary | ICD-10-CM | POA: Diagnosis present

## 2022-05-13 DIAGNOSIS — N401 Enlarged prostate with lower urinary tract symptoms: Secondary | ICD-10-CM | POA: Diagnosis present

## 2022-05-13 DIAGNOSIS — I2510 Atherosclerotic heart disease of native coronary artery without angina pectoris: Secondary | ICD-10-CM | POA: Diagnosis present

## 2022-05-13 DIAGNOSIS — F419 Anxiety disorder, unspecified: Secondary | ICD-10-CM | POA: Diagnosis present

## 2022-05-13 DIAGNOSIS — R338 Other retention of urine: Secondary | ICD-10-CM | POA: Diagnosis present

## 2022-05-13 DIAGNOSIS — J61 Pneumoconiosis due to asbestos and other mineral fibers: Secondary | ICD-10-CM | POA: Diagnosis present

## 2022-05-13 DIAGNOSIS — E1122 Type 2 diabetes mellitus with diabetic chronic kidney disease: Secondary | ICD-10-CM | POA: Diagnosis present

## 2022-05-13 DIAGNOSIS — J90 Pleural effusion, not elsewhere classified: Secondary | ICD-10-CM

## 2022-05-13 DIAGNOSIS — Z888 Allergy status to other drugs, medicaments and biological substances status: Secondary | ICD-10-CM

## 2022-05-13 DIAGNOSIS — E1169 Type 2 diabetes mellitus with other specified complication: Secondary | ICD-10-CM | POA: Diagnosis present

## 2022-05-13 DIAGNOSIS — Z7902 Long term (current) use of antithrombotics/antiplatelets: Secondary | ICD-10-CM

## 2022-05-13 DIAGNOSIS — J9611 Chronic respiratory failure with hypoxia: Secondary | ICD-10-CM | POA: Diagnosis present

## 2022-05-13 DIAGNOSIS — I252 Old myocardial infarction: Secondary | ICD-10-CM

## 2022-05-13 DIAGNOSIS — Z833 Family history of diabetes mellitus: Secondary | ICD-10-CM

## 2022-05-13 DIAGNOSIS — Z9981 Dependence on supplemental oxygen: Secondary | ICD-10-CM

## 2022-05-13 DIAGNOSIS — R778 Other specified abnormalities of plasma proteins: Secondary | ICD-10-CM

## 2022-05-13 HISTORY — DX: Chronic systolic (congestive) heart failure: I50.22

## 2022-05-13 HISTORY — DX: Ventricular premature depolarization: I49.3

## 2022-05-13 HISTORY — DX: Anemia, unspecified: D64.9

## 2022-05-13 HISTORY — DX: Paroxysmal atrial fibrillation: I48.0

## 2022-05-13 HISTORY — DX: Sleep apnea, unspecified: G47.30

## 2022-05-13 HISTORY — DX: Chronic kidney disease, stage 4 (severe): N18.4

## 2022-05-13 LAB — CBC WITH DIFFERENTIAL/PLATELET
Abs Immature Granulocytes: 0.05 10*3/uL (ref 0.00–0.07)
Basophils Absolute: 0.1 10*3/uL (ref 0.0–0.1)
Basophils Relative: 1 %
Eosinophils Absolute: 0 10*3/uL (ref 0.0–0.5)
Eosinophils Relative: 0 %
HCT: 27.8 % — ABNORMAL LOW (ref 39.0–52.0)
Hemoglobin: 8.6 g/dL — ABNORMAL LOW (ref 13.0–17.0)
Immature Granulocytes: 1 %
Lymphocytes Relative: 7 %
Lymphs Abs: 0.6 10*3/uL — ABNORMAL LOW (ref 0.7–4.0)
MCH: 28.5 pg (ref 26.0–34.0)
MCHC: 30.9 g/dL (ref 30.0–36.0)
MCV: 92.1 fL (ref 80.0–100.0)
Monocytes Absolute: 0.6 10*3/uL (ref 0.1–1.0)
Monocytes Relative: 6 %
Neutro Abs: 8.6 10*3/uL — ABNORMAL HIGH (ref 1.7–7.7)
Neutrophils Relative %: 85 %
Platelets: 220 10*3/uL (ref 150–400)
RBC: 3.02 MIL/uL — ABNORMAL LOW (ref 4.22–5.81)
RDW: 15.8 % — ABNORMAL HIGH (ref 11.5–15.5)
WBC: 9.9 10*3/uL (ref 4.0–10.5)
nRBC: 0.6 % — ABNORMAL HIGH (ref 0.0–0.2)

## 2022-05-13 LAB — BASIC METABOLIC PANEL
Anion gap: 13 (ref 5–15)
BUN: 87 mg/dL — ABNORMAL HIGH (ref 8–23)
CO2: 29 mmol/L (ref 22–32)
Calcium: 9.2 mg/dL (ref 8.9–10.3)
Chloride: 90 mmol/L — ABNORMAL LOW (ref 98–111)
Creatinine, Ser: 3.23 mg/dL — ABNORMAL HIGH (ref 0.61–1.24)
GFR, Estimated: 18 mL/min — ABNORMAL LOW (ref >60)
Glucose, Bld: 319 mg/dL — ABNORMAL HIGH (ref 70–99)
Potassium: 3.7 mmol/L (ref 3.5–5.1)
Sodium: 132 mmol/L — ABNORMAL LOW (ref 135–145)

## 2022-05-13 LAB — TROPONIN I (HIGH SENSITIVITY)
Troponin I (High Sensitivity): 84 ng/L — ABNORMAL HIGH (ref <18)
Troponin I (High Sensitivity): 107 ng/L (ref <18)

## 2022-05-13 LAB — BRAIN NATRIURETIC PEPTIDE: B Natriuretic Peptide: 939 pg/mL — ABNORMAL HIGH (ref 0.0–100.0)

## 2022-05-13 NOTE — ED Provider Triage Note (Signed)
Emergency Medicine Provider Triage Evaluation Note  Montell Leopard , a 81 y.o. male  was evaluated in triage.  Pt complains of shortness of breath onset 1 week.  Patient notes that he wears 4 L of oxygen continuously at baseline.  Has not had increase the amount of oxygen.  He is on torsemide for his CHF without any missed doses.  No other meds tried prior to arrival. Patient notes he has a history of MI in November 2022.  They wanted to have a heart catheterization completed at that time however they were unable to due to the patient's renal function.  No stents placed.  His cardiologist is Dr. [name].  Denies fever, chills.    Review of Systems  Positive: As per HPI Negative:   Physical Exam  BP 118/61 (BP Location: Right Arm)   Pulse 80   Temp 98.7 F (37.1 C) (Oral)   Resp 20   SpO2 98%  Gen:   Awake, no distress, on 4 L via nasal cannula Resp:  Normal effort,  MSK:   Moves extremities without difficulty  Other:  No abdominal tenderness to palpation  Medical Decision Making  Medically screening exam initiated at 8:47 PM.  Appropriate orders placed.  Gonsalo Cuthbertson was informed that the remainder of the evaluation will be completed by another provider, this initial triage assessment does not replace that evaluation, and the importance of remaining in the ED until their evaluation is complete.  Work-up initiated   Brinley Rosete A, PA-C 05/13/22 2052

## 2022-05-13 NOTE — ED Triage Notes (Addendum)
Patient reports worsening exertional dyspnea with chest tightness this week , history of CHF/CAD/Stents. No fever or chills , his cardiologist is Dr. [name].

## 2022-05-14 ENCOUNTER — Inpatient Hospital Stay (HOSPITAL_COMMUNITY): Payer: Medicare Other

## 2022-05-14 ENCOUNTER — Encounter (HOSPITAL_COMMUNITY): Payer: Self-pay | Admitting: Internal Medicine

## 2022-05-14 DIAGNOSIS — R778 Other specified abnormalities of plasma proteins: Secondary | ICD-10-CM | POA: Diagnosis not present

## 2022-05-14 DIAGNOSIS — J449 Chronic obstructive pulmonary disease, unspecified: Secondary | ICD-10-CM

## 2022-05-14 DIAGNOSIS — I739 Peripheral vascular disease, unspecified: Secondary | ICD-10-CM

## 2022-05-14 DIAGNOSIS — J9611 Chronic respiratory failure with hypoxia: Secondary | ICD-10-CM

## 2022-05-14 DIAGNOSIS — I5043 Acute on chronic combined systolic (congestive) and diastolic (congestive) heart failure: Secondary | ICD-10-CM | POA: Diagnosis present

## 2022-05-14 DIAGNOSIS — G4733 Obstructive sleep apnea (adult) (pediatric): Secondary | ICD-10-CM

## 2022-05-14 DIAGNOSIS — I5023 Acute on chronic systolic (congestive) heart failure: Secondary | ICD-10-CM | POA: Diagnosis present

## 2022-05-14 DIAGNOSIS — F32A Depression, unspecified: Secondary | ICD-10-CM

## 2022-05-14 DIAGNOSIS — I493 Ventricular premature depolarization: Secondary | ICD-10-CM | POA: Diagnosis present

## 2022-05-14 DIAGNOSIS — I480 Paroxysmal atrial fibrillation: Secondary | ICD-10-CM

## 2022-05-14 DIAGNOSIS — N401 Enlarged prostate with lower urinary tract symptoms: Secondary | ICD-10-CM | POA: Diagnosis not present

## 2022-05-14 DIAGNOSIS — N184 Chronic kidney disease, stage 4 (severe): Secondary | ICD-10-CM | POA: Diagnosis present

## 2022-05-14 DIAGNOSIS — I10 Essential (primary) hypertension: Secondary | ICD-10-CM

## 2022-05-14 DIAGNOSIS — Z66 Do not resuscitate: Secondary | ICD-10-CM | POA: Diagnosis present

## 2022-05-14 DIAGNOSIS — I25810 Atherosclerosis of coronary artery bypass graft(s) without angina pectoris: Secondary | ICD-10-CM | POA: Diagnosis present

## 2022-05-14 DIAGNOSIS — E785 Hyperlipidemia, unspecified: Secondary | ICD-10-CM

## 2022-05-14 DIAGNOSIS — J90 Pleural effusion, not elsewhere classified: Secondary | ICD-10-CM

## 2022-05-14 DIAGNOSIS — N189 Chronic kidney disease, unspecified: Secondary | ICD-10-CM

## 2022-05-14 DIAGNOSIS — Z515 Encounter for palliative care: Secondary | ICD-10-CM | POA: Diagnosis not present

## 2022-05-14 DIAGNOSIS — I130 Hypertensive heart and chronic kidney disease with heart failure and stage 1 through stage 4 chronic kidney disease, or unspecified chronic kidney disease: Secondary | ICD-10-CM | POA: Diagnosis present

## 2022-05-14 DIAGNOSIS — N179 Acute kidney failure, unspecified: Secondary | ICD-10-CM | POA: Diagnosis present

## 2022-05-14 DIAGNOSIS — E1151 Type 2 diabetes mellitus with diabetic peripheral angiopathy without gangrene: Secondary | ICD-10-CM | POA: Diagnosis present

## 2022-05-14 DIAGNOSIS — E1122 Type 2 diabetes mellitus with diabetic chronic kidney disease: Secondary | ICD-10-CM | POA: Diagnosis present

## 2022-05-14 DIAGNOSIS — I255 Ischemic cardiomyopathy: Secondary | ICD-10-CM | POA: Diagnosis present

## 2022-05-14 DIAGNOSIS — I25700 Atherosclerosis of coronary artery bypass graft(s), unspecified, with unstable angina pectoris: Secondary | ICD-10-CM | POA: Diagnosis not present

## 2022-05-14 DIAGNOSIS — D509 Iron deficiency anemia, unspecified: Secondary | ICD-10-CM | POA: Diagnosis present

## 2022-05-14 DIAGNOSIS — E1169 Type 2 diabetes mellitus with other specified complication: Secondary | ICD-10-CM

## 2022-05-14 DIAGNOSIS — K219 Gastro-esophageal reflux disease without esophagitis: Secondary | ICD-10-CM | POA: Diagnosis present

## 2022-05-14 DIAGNOSIS — J61 Pneumoconiosis due to asbestos and other mineral fibers: Secondary | ICD-10-CM | POA: Diagnosis present

## 2022-05-14 DIAGNOSIS — R9431 Abnormal electrocardiogram [ECG] [EKG]: Secondary | ICD-10-CM | POA: Diagnosis present

## 2022-05-14 DIAGNOSIS — R319 Hematuria, unspecified: Secondary | ICD-10-CM | POA: Diagnosis not present

## 2022-05-14 DIAGNOSIS — E871 Hypo-osmolality and hyponatremia: Secondary | ICD-10-CM

## 2022-05-14 DIAGNOSIS — F419 Anxiety disorder, unspecified: Secondary | ICD-10-CM | POA: Diagnosis present

## 2022-05-14 DIAGNOSIS — J9621 Acute and chronic respiratory failure with hypoxia: Secondary | ICD-10-CM | POA: Diagnosis present

## 2022-05-14 DIAGNOSIS — I2510 Atherosclerotic heart disease of native coronary artery without angina pectoris: Secondary | ICD-10-CM | POA: Diagnosis present

## 2022-05-14 LAB — IRON AND TIBC
Iron: 27 ug/dL — ABNORMAL LOW (ref 45–182)
Saturation Ratios: 7 % — ABNORMAL LOW (ref 17.9–39.5)
TIBC: 413 ug/dL (ref 250–450)
UIBC: 386 ug/dL

## 2022-05-14 LAB — CBC
HCT: 25.9 % — ABNORMAL LOW (ref 39.0–52.0)
Hemoglobin: 8 g/dL — ABNORMAL LOW (ref 13.0–17.0)
MCH: 28.3 pg (ref 26.0–34.0)
MCHC: 30.9 g/dL (ref 30.0–36.0)
MCV: 91.5 fL (ref 80.0–100.0)
Platelets: 217 10*3/uL (ref 150–400)
RBC: 2.83 MIL/uL — ABNORMAL LOW (ref 4.22–5.81)
RDW: 15.6 % — ABNORMAL HIGH (ref 11.5–15.5)
WBC: 8.2 10*3/uL (ref 4.0–10.5)
nRBC: 0.6 % — ABNORMAL HIGH (ref 0.0–0.2)

## 2022-05-14 LAB — I-STAT CHEM 8, ED
BUN: 96 mg/dL — ABNORMAL HIGH (ref 8–23)
Calcium, Ion: 1.04 mmol/L — ABNORMAL LOW (ref 1.15–1.40)
Chloride: 89 mmol/L — ABNORMAL LOW (ref 98–111)
Creatinine, Ser: 3.2 mg/dL — ABNORMAL HIGH (ref 0.61–1.24)
Glucose, Bld: 267 mg/dL — ABNORMAL HIGH (ref 70–99)
HCT: 26 % — ABNORMAL LOW (ref 39.0–52.0)
Hemoglobin: 8.8 g/dL — ABNORMAL LOW (ref 13.0–17.0)
Potassium: 3.2 mmol/L — ABNORMAL LOW (ref 3.5–5.1)
Sodium: 131 mmol/L — ABNORMAL LOW (ref 135–145)
TCO2: 29 mmol/L (ref 22–32)

## 2022-05-14 LAB — URINALYSIS, COMPLETE (UACMP) WITH MICROSCOPIC
Bilirubin Urine: NEGATIVE
Glucose, UA: NEGATIVE mg/dL
Ketones, ur: NEGATIVE mg/dL
Leukocytes,Ua: NEGATIVE
Nitrite: NEGATIVE
Protein, ur: NEGATIVE mg/dL
Specific Gravity, Urine: 1.01 (ref 1.005–1.030)
pH: 5 (ref 5.0–8.0)

## 2022-05-14 LAB — GLUCOSE, CAPILLARY: Glucose-Capillary: 240 mg/dL — ABNORMAL HIGH (ref 70–99)

## 2022-05-14 LAB — HEPATIC FUNCTION PANEL
ALT: 9 U/L (ref 0–44)
AST: 13 U/L — ABNORMAL LOW (ref 15–41)
Albumin: 3 g/dL — ABNORMAL LOW (ref 3.5–5.0)
Alkaline Phosphatase: 45 U/L (ref 38–126)
Bilirubin, Direct: 0.2 mg/dL (ref 0.0–0.2)
Indirect Bilirubin: 0.7 mg/dL (ref 0.3–0.9)
Total Bilirubin: 0.9 mg/dL (ref 0.3–1.2)
Total Protein: 6 g/dL — ABNORMAL LOW (ref 6.5–8.1)

## 2022-05-14 LAB — BASIC METABOLIC PANEL
Anion gap: 11 (ref 5–15)
Anion gap: 11 (ref 5–15)
BUN: 89 mg/dL — ABNORMAL HIGH (ref 8–23)
BUN: 89 mg/dL — ABNORMAL HIGH (ref 8–23)
CO2: 31 mmol/L (ref 22–32)
CO2: 31 mmol/L (ref 22–32)
Calcium: 8.9 mg/dL (ref 8.9–10.3)
Calcium: 9.1 mg/dL (ref 8.9–10.3)
Chloride: 91 mmol/L — ABNORMAL LOW (ref 98–111)
Chloride: 91 mmol/L — ABNORMAL LOW (ref 98–111)
Creatinine, Ser: 3.17 mg/dL — ABNORMAL HIGH (ref 0.61–1.24)
Creatinine, Ser: 3.2 mg/dL — ABNORMAL HIGH (ref 0.61–1.24)
GFR, Estimated: 19 mL/min — ABNORMAL LOW (ref >60)
GFR, Estimated: 18 mL/min — ABNORMAL LOW (ref >60)
Glucose, Bld: 264 mg/dL — ABNORMAL HIGH (ref 70–99)
Glucose, Bld: 290 mg/dL — ABNORMAL HIGH (ref 70–99)
Potassium: 3.2 mmol/L — ABNORMAL LOW (ref 3.5–5.1)
Potassium: 3.7 mmol/L (ref 3.5–5.1)
Sodium: 133 mmol/L — ABNORMAL LOW (ref 135–145)
Sodium: 133 mmol/L — ABNORMAL LOW (ref 135–145)

## 2022-05-14 LAB — TSH: TSH: 1.956 u[IU]/mL (ref 0.350–4.500)

## 2022-05-14 LAB — OSMOLALITY, URINE: Osmolality, Ur: 321 mOsm/kg (ref 300–900)

## 2022-05-14 LAB — VITAMIN B12: Vitamin B-12: 775 pg/mL (ref 180–914)

## 2022-05-14 LAB — I-STAT VENOUS BLOOD GAS, ED
Acid-Base Excess: 10 mmol/L — ABNORMAL HIGH (ref 0.0–2.0)
Bicarbonate: 34.4 mmol/L — ABNORMAL HIGH (ref 20.0–28.0)
Calcium, Ion: 1.07 mmol/L — ABNORMAL LOW (ref 1.15–1.40)
HCT: 26 % — ABNORMAL LOW (ref 39.0–52.0)
Hemoglobin: 8.8 g/dL — ABNORMAL LOW (ref 13.0–17.0)
O2 Saturation: 99 %
Potassium: 3.2 mmol/L — ABNORMAL LOW (ref 3.5–5.1)
Sodium: 131 mmol/L — ABNORMAL LOW (ref 135–145)
TCO2: 36 mmol/L — ABNORMAL HIGH (ref 22–32)
pCO2, Ven: 43.6 mmHg — ABNORMAL LOW (ref 44–60)
pH, Ven: 7.505 — ABNORMAL HIGH (ref 7.25–7.43)
pO2, Ven: 136 mmHg — ABNORMAL HIGH (ref 32–45)

## 2022-05-14 LAB — TROPONIN I (HIGH SENSITIVITY)
Troponin I (High Sensitivity): 115 ng/L (ref <18)
Troponin I (High Sensitivity): 110 ng/L (ref <18)

## 2022-05-14 LAB — MAGNESIUM: Magnesium: 2.2 mg/dL (ref 1.7–2.4)

## 2022-05-14 LAB — RETICULOCYTES
Immature Retic Fract: 35.7 % — ABNORMAL HIGH (ref 2.3–15.9)
RBC.: 2.81 MIL/uL — ABNORMAL LOW (ref 4.22–5.81)
Retic Count, Absolute: 101.2 10*3/uL (ref 19.0–186.0)
Retic Ct Pct: 3.6 % — ABNORMAL HIGH (ref 0.4–3.1)

## 2022-05-14 LAB — CBG MONITORING, ED
Glucose-Capillary: 262 mg/dL — ABNORMAL HIGH (ref 70–99)
Glucose-Capillary: 240 mg/dL — ABNORMAL HIGH (ref 70–99)
Glucose-Capillary: 100 mg/dL — ABNORMAL HIGH (ref 70–99)
Glucose-Capillary: 195 mg/dL — ABNORMAL HIGH (ref 70–99)
Glucose-Capillary: 256 mg/dL — ABNORMAL HIGH (ref 70–99)

## 2022-05-14 LAB — SODIUM, URINE, RANDOM: Sodium, Ur: 23 mmol/L

## 2022-05-14 LAB — CREATININE, URINE, RANDOM: Creatinine, Urine: 100 mg/dL

## 2022-05-14 LAB — HEMOGLOBIN A1C
Hgb A1c MFr Bld: 7.2 % — ABNORMAL HIGH (ref 4.8–5.6)
Mean Plasma Glucose: 159.94 mg/dL

## 2022-05-14 LAB — OSMOLALITY: Osmolality: 316 mOsm/kg — ABNORMAL HIGH (ref 275–295)

## 2022-05-14 LAB — PHOSPHORUS: Phosphorus: 4.5 mg/dL (ref 2.5–4.6)

## 2022-05-14 LAB — PREALBUMIN: Prealbumin: 22 mg/dL (ref 18–38)

## 2022-05-14 LAB — CK: Total CK: 110 U/L (ref 49–397)

## 2022-05-14 LAB — FOLATE: Folate: >40 ng/mL (ref >5.9)

## 2022-05-14 LAB — FERRITIN: Ferritin: 77 ng/mL (ref 24–336)

## 2022-05-14 MED ORDER — TAMSULOSIN HCL 0.4 MG PO CAPS
0.4000 mg | ORAL_CAPSULE | Freq: Every day | ORAL | Status: DC
  Administered 2022-05-14 – 2022-05-21 (×8): 0.4 mg via ORAL
  Filled 2022-05-14 (×8): qty 1

## 2022-05-14 MED ORDER — ASPIRIN 81 MG PO CHEW
324.0000 mg | CHEWABLE_TABLET | Freq: Once | ORAL | Status: AC
  Administered 2022-05-14: 324 mg via ORAL
  Filled 2022-05-14: qty 4

## 2022-05-14 MED ORDER — CARVEDILOL 3.125 MG PO TABS
3.1250 mg | ORAL_TABLET | Freq: Two times a day (BID) | ORAL | Status: DC
  Administered 2022-05-14 – 2022-05-21 (×14): 3.125 mg via ORAL
  Filled 2022-05-14 (×14): qty 1

## 2022-05-14 MED ORDER — PANTOPRAZOLE SODIUM 40 MG PO TBEC
40.0000 mg | DELAYED_RELEASE_TABLET | Freq: Every day | ORAL | Status: DC
  Administered 2022-05-14 – 2022-05-16 (×3): 40 mg via ORAL
  Filled 2022-05-14 (×3): qty 1

## 2022-05-14 MED ORDER — IPRATROPIUM-ALBUTEROL 0.5-2.5 (3) MG/3ML IN SOLN
3.0000 mL | Freq: Three times a day (TID) | RESPIRATORY_TRACT | Status: DC
  Administered 2022-05-14 – 2022-05-16 (×7): 3 mL via RESPIRATORY_TRACT
  Filled 2022-05-14 (×7): qty 3

## 2022-05-14 MED ORDER — APIXABAN 2.5 MG PO TABS
2.5000 mg | ORAL_TABLET | Freq: Two times a day (BID) | ORAL | Status: DC
  Administered 2022-05-14 (×3): 2.5 mg via ORAL
  Filled 2022-05-14 (×3): qty 1

## 2022-05-14 MED ORDER — MOMETASONE FURO-FORMOTEROL FUM 200-5 MCG/ACT IN AERO
2.0000 | INHALATION_SPRAY | Freq: Two times a day (BID) | RESPIRATORY_TRACT | Status: DC
Start: 2022-05-14 — End: 2022-05-21
  Administered 2022-05-14 – 2022-05-21 (×14): 2 via RESPIRATORY_TRACT
  Filled 2022-05-14 (×2): qty 8.8

## 2022-05-14 MED ORDER — INSULIN ASPART 100 UNIT/ML IJ SOLN
0.0000 [IU] | INTRAMUSCULAR | Status: DC
  Administered 2022-05-14: 2 [IU] via SUBCUTANEOUS
  Administered 2022-05-14: 5 [IU] via SUBCUTANEOUS
  Administered 2022-05-14 (×2): 3 [IU] via SUBCUTANEOUS
  Administered 2022-05-14: 5 [IU] via SUBCUTANEOUS
  Administered 2022-05-15: 2 [IU] via SUBCUTANEOUS
  Administered 2022-05-15: 5 [IU] via SUBCUTANEOUS
  Administered 2022-05-15: 2 [IU] via SUBCUTANEOUS
  Administered 2022-05-15: 7 [IU] via SUBCUTANEOUS
  Administered 2022-05-15 – 2022-05-16 (×3): 3 [IU] via SUBCUTANEOUS
  Administered 2022-05-16: 9 [IU] via SUBCUTANEOUS
  Administered 2022-05-16 – 2022-05-17 (×4): 2 [IU] via SUBCUTANEOUS

## 2022-05-14 MED ORDER — THIAMINE HCL 100 MG PO TABS
100.0000 mg | ORAL_TABLET | Freq: Every day | ORAL | Status: DC
  Administered 2022-05-14 – 2022-05-16 (×3): 100 mg via ORAL
  Filled 2022-05-14 (×3): qty 1

## 2022-05-14 MED ORDER — POTASSIUM CHLORIDE CRYS ER 20 MEQ PO TBCR
40.0000 meq | EXTENDED_RELEASE_TABLET | ORAL | Status: AC
  Administered 2022-05-14: 40 meq via ORAL
  Filled 2022-05-14 (×2): qty 2

## 2022-05-14 MED ORDER — ALBUTEROL SULFATE (2.5 MG/3ML) 0.083% IN NEBU
2.5000 mg | INHALATION_SOLUTION | Freq: Four times a day (QID) | RESPIRATORY_TRACT | Status: DC | PRN
Start: 2022-05-14 — End: 2022-05-21
  Administered 2022-05-14 – 2022-05-19 (×2): 2.5 mg via RESPIRATORY_TRACT
  Filled 2022-05-14 (×2): qty 3

## 2022-05-14 MED ORDER — FOLIC ACID 1 MG PO TABS
1.0000 mg | ORAL_TABLET | Freq: Every day | ORAL | Status: DC
  Administered 2022-05-15 – 2022-05-16 (×2): 1 mg via ORAL
  Filled 2022-05-14 (×2): qty 1

## 2022-05-14 MED ORDER — ALBUTEROL SULFATE HFA 108 (90 BASE) MCG/ACT IN AERS
2.0000 | INHALATION_SPRAY | Freq: Four times a day (QID) | RESPIRATORY_TRACT | Status: DC | PRN
Start: 2022-05-14 — End: 2022-05-14

## 2022-05-14 MED ORDER — FUROSEMIDE 10 MG/ML IJ SOLN
120.0000 mg | Freq: Every day | INTRAVENOUS | Status: DC
  Administered 2022-05-14: 120 mg via INTRAVENOUS
  Filled 2022-05-14: qty 10

## 2022-05-14 MED ORDER — CYCLOBENZAPRINE HCL 5 MG PO TABS
2.5000 mg | ORAL_TABLET | Freq: Once | ORAL | Status: AC
  Administered 2022-05-14: 2.5 mg via ORAL
  Filled 2022-05-14: qty 0.5

## 2022-05-14 MED ORDER — ACETAMINOPHEN 325 MG PO TABS
650.0000 mg | ORAL_TABLET | Freq: Four times a day (QID) | ORAL | Status: DC | PRN
  Administered 2022-05-14 – 2022-05-18 (×3): 650 mg via ORAL
  Filled 2022-05-14 (×4): qty 2

## 2022-05-14 MED ORDER — CLOPIDOGREL BISULFATE 75 MG PO TABS
75.0000 mg | ORAL_TABLET | Freq: Every day | ORAL | Status: DC
  Administered 2022-05-14: 75 mg via ORAL
  Filled 2022-05-14: qty 1

## 2022-05-14 MED ORDER — SODIUM CHLORIDE 0.9 % IV SOLN: 250.0000 mL | INTRAVENOUS | Status: DC | PRN

## 2022-05-14 MED ORDER — FUROSEMIDE 10 MG/ML IJ SOLN
120.0000 mg | Freq: Once | INTRAVENOUS | Status: AC
  Administered 2022-05-14: 120 mg via INTRAVENOUS
  Filled 2022-05-14: qty 10

## 2022-05-14 MED ORDER — ALPRAZOLAM 0.25 MG PO TABS
0.2500 mg | ORAL_TABLET | Freq: Two times a day (BID) | ORAL | Status: DC | PRN
  Administered 2022-05-14: 0.25 mg via ORAL
  Filled 2022-05-14: qty 1

## 2022-05-14 MED ORDER — SODIUM CHLORIDE 0.9% FLUSH: 3.0000 mL | INTRAVENOUS | Status: DC | PRN

## 2022-05-14 MED ORDER — INSULIN GLARGINE-YFGN 100 UNIT/ML ~~LOC~~ SOLN
10.0000 [IU] | Freq: Every day | SUBCUTANEOUS | Status: DC
  Administered 2022-05-14 (×2): 10 [IU] via SUBCUTANEOUS
  Filled 2022-05-14 (×3): qty 0.1

## 2022-05-14 MED ORDER — ACETAMINOPHEN 650 MG RE SUPP: 650.0000 mg | Freq: Four times a day (QID) | RECTAL | Status: DC | PRN

## 2022-05-14 MED ORDER — FERROUS SULFATE 325 (65 FE) MG PO TABS
325.0000 mg | ORAL_TABLET | Freq: Two times a day (BID) | ORAL | Status: DC
  Administered 2022-05-14 – 2022-05-16 (×5): 325 mg via ORAL
  Filled 2022-05-14 (×5): qty 1

## 2022-05-14 MED ORDER — ROSUVASTATIN CALCIUM 5 MG PO TABS
10.0000 mg | ORAL_TABLET | Freq: Every evening | ORAL | Status: DC
  Administered 2022-05-14 – 2022-05-15 (×2): 10 mg via ORAL
  Filled 2022-05-14 (×2): qty 2

## 2022-05-14 MED ORDER — SODIUM CHLORIDE 0.9% FLUSH
3.0000 mL | Freq: Two times a day (BID) | INTRAVENOUS | Status: DC
  Administered 2022-05-14 (×2): 3 mL via INTRAVENOUS

## 2022-05-14 MED ORDER — GABAPENTIN 300 MG PO CAPS
300.0000 mg | ORAL_CAPSULE | Freq: Every day | ORAL | Status: DC
  Administered 2022-05-14 – 2022-05-20 (×7): 300 mg via ORAL
  Filled 2022-05-14 (×7): qty 1

## 2022-05-14 MED ORDER — SODIUM CHLORIDE 0.9 % IV SOLN
510.0000 mg | Freq: Once | INTRAVENOUS | Status: AC
  Administered 2022-05-14: 510 mg via INTRAVENOUS
  Filled 2022-05-14: qty 17

## 2022-05-14 MED ORDER — SERTRALINE HCL 50 MG PO TABS
25.0000 mg | ORAL_TABLET | Freq: Every evening | ORAL | Status: DC
  Administered 2022-05-14 – 2022-05-20 (×7): 25 mg via ORAL
  Filled 2022-05-14 (×7): qty 1

## 2022-05-14 MED ORDER — POTASSIUM CHLORIDE CRYS ER 20 MEQ PO TBCR: 40.0000 meq | EXTENDED_RELEASE_TABLET | Freq: Two times a day (BID) | ORAL | Status: DC

## 2022-05-14 NOTE — ED Notes (Signed)
Breakfast order placed ?

## 2022-05-14 NOTE — Assessment & Plan Note (Signed)
-   Pt diagnosed with CHF based on presence of the following: PND, OA, rales on exam, cardiomegaly, Pulmonary edema on CXR, and   bilateral leg edema pleural effusion  With noted response to IV diuretic in ER  admit on telemetry,  cycle cardiac enzymes 84 105   obtain serial ECG  to evaluate for ischemia as a cause of heart failure  monitor daily weight: There were no vitals filed for this visit. Last BNP BNP (last 3 results) Recent Labs    04/10/22 1107 04/27/22 1125 05/13/22 2053  BNP 777.2* 891.7* 939.0*      diurese with IV lasix 120 mg daily and monitor orthostatics and creatinine to avoid over diuresis. Had recent echogram will not repeat  ACE/ARBi  Contraindicated    cardiology consulted

## 2022-05-14 NOTE — ED Notes (Signed)
Patient still hadn't voided since lasix, completed a bladder scan, showed 672ml of urine, messaged Dr. Hal Hope to make aware and received order to perform I&O cath

## 2022-05-14 NOTE — Assessment & Plan Note (Addendum)
Decreased dose of LAntus to 10 units qhs with worsening CKD Order sliding scale

## 2022-05-14 NOTE — Assessment & Plan Note (Signed)
Continue Plavix 75 mg a day, Toprol 50 mg daily and Crestor 10 mg p.o. daily

## 2022-05-14 NOTE — Assessment & Plan Note (Signed)
Obtain urine electrolytes monitor renal function Obtain renal ultrasound If worsens may need nephrology consult

## 2022-05-14 NOTE — ED Notes (Signed)
Sitting up eating breakfast

## 2022-05-14 NOTE — Assessment & Plan Note (Signed)
Continue with sertraline 25 mg nightly and alprazolam 0.25 as needed twice a day

## 2022-05-14 NOTE — Assessment & Plan Note (Signed)
Close to patient's baseline likely in the setting of demand continue to follow appreciate cardiology involvement Will not initiate heparin at this patient is already on Eliquis

## 2022-05-14 NOTE — ED Notes (Signed)
Palliative Care PA at bedside with family members

## 2022-05-14 NOTE — Assessment & Plan Note (Signed)
Continue statin Crestor10 mg p.o. daily, clopidogrel  75 mg p.o. daily and blood pressure control.  Follow up as outpatient

## 2022-05-14 NOTE — Progress Notes (Signed)
Patient seen and examined, admitted earlier this morning by Dr. Roel Cluck, please see her H&P for details, briefly Mr. 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  Acute on chronic systolic CHF -Last echo 1/00 with EF of 25-30% with wall motion abnormality, ischemic work-up not pursued on account of advanced CKD on torsemide 60 Mg at baseline with weekly metolazone, patient reports compliance with diuretics, daughter cooks his meals, follows low-salt diet -Diuresed with 120 Mg of IV Lasix here -Appreciate cardiology input, hydralazine/Isordil on hold -Seen by CHF TOC team multiple times in the last 1 to 2 months -Overall prognosis appears to be poor with worsening cardiomyopathy, progressive kidney disease, frequent hospitalizations, palliative medicine consulted for goals of care  History of CAD NSTEMI 3/23 treated medically -Continue Plavix, Eliquis, beta-blocker and statin  AKI on CKD 4 -Likely cardiorenal, renal ultrasound without hydronephrosis -Avoid hypotension,, monitor urine output, BMP -I do not think he would be a good dialysis candidate -Palliative consulted for goals of care  COPD/chronic respiratory failure -On 4 L home O2 at baseline -Continue DuoNebs, Dulera  Iron deficiency anemia -Chronic, stable, monitor -We will give IV iron, anemia panel with severe deficiency  Type 2 diabetes mellitus -Continue glargine, sliding scale insulin  OSA -Continue CPAP  BPH -Continue Flomax  DNR  John [name]

## 2022-05-14 NOTE — Consult Note (Addendum)
Cardiology Consultation:   Patient ID: John [name] MRN: [id number]; DOB: 1941-03-26  Admit date: 05/13/2022 Date of Consult: 05/14/2022  PCP:  [name], [facility] [facility] Cardiologist:  Kirk Ruths, MD        Patient Profile:   John [name] is a 81 y.o. male with a hx of COPD, chronic respiratory failure on 4L home O2, asbestosis, chronic HFrEF, PAF, frequent PVCs, CAD (DESx2 to LAD in 2013, NSTEMI 12/2021 with med rx), T2DM, HTN, HLD, PAD (s/p right fem-pop bypass at [facility] in 05/2018) followed at [facility], CVA, stage IV CKD, severe OSA on CPAP, chronic appearing anemia who is being seen 05/14/2022 for the evaluation of CHF at the request of Dr. Roel Cluck.  History of Present Illness:   [name] had prior reported MI 40 years ago. Cardiac catheterization in June 2013 in [place] with PCI/placement of 2 DES to the LAD. There was residual 30-40% mRCA, 30-40% mPDA, 90% LCx. EF was 40%. He was admitted in 12/2021 with NSTEMI with troponin >24k, a/c CHF (EF drop to 35% from 40-45%), new AF. Medical management was pursued due to AKI with peak Cr 3.79. Was not discharged on diuretic due to AKI/possible ATN. Has had multiple HF admissions since that time requiring diuresis and thoracentesis, complicated by various issues including COPD exacerbations, hypoxia, hypercapnia, urinary retention, elevated troponin felt due to demand ischemia, and generalized weakness. Last echo 02/2022 showed EF 25-30% with WMA, moderate LV dilation, normal RV, dilated IVC. [place] 03/26/22 showed the following, felt to represent inadequate diuresis: Right atrial pressure-14/11 Right ventricular pressure-58/7 Pulmonary artery pressure-59/23, mean 41 Pulmonary capillary wedge pressure-A-wave 28, V wave 32, mean 23 Cardiac output (Fick) 5.3 L/min with an index of 2.7 L/min/m.  He was last seen by the [facility] HF team 04/27/22 and was felt to be doing OK, with plan to continue torsemide 60mg  daily with 2.5mg  once  weekly on Saturday, encouraged to take an extra 2.5mg  of metolazone for weight gain or worsening CHF symptoms. He is not on ACEi/ARB/ARNI/MRA due to CKD and not on SGLT2i due to advanced age/mobility, borderline Cr, and hygiene. He was felt to be benefit from palliative care referral for [place] discussion.  He presented back to the hospital for his 6th admission since March yesterday with worsening SOB for the last 3 days. He had taken an extra torsemide yesterday without significant relief. Denies any worsening swelling, chest pain, fever, chills, or significant weight changes. Otherwise reports adherence to his medications. EKG/tele c/w NSR with frequent PVCs. Labs notable for Na nadir 131, K 3.2, Cr 3.23->3.17 (prev 2.33 on 7/7), albumin 3.0, pH 7.505, PCo2 43.6, PO2 136, hsTroponin 84-107-115-110, Hgb 8-8.8, A1c 7.2, TSH wnl. CXR c/w mild CHF. Renal US with medical renal disease without hydronephrosis. He denies any recent change in UOP prior to admission but required I/O cath here. Main complaint presently is intermittent leg cramps. He also reports a new dry cough after eating breakfast this AM and SLP has been contacted. Weight not available yet.   Past Medical History:  Diagnosis Date   Anemia    Anxiety    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)    BPH (benign prostatic hyperplasia)    CAD (coronary artery disease)    Cholesteatoma of attic    Chronic HFrEF (heart failure with reduced ejection fraction) (HCC)    Chronic kidney disease, stage 4 (severe) (HCC)    Depression    Diabetes mellitus ([place])    Ear disease    GERD (  gastroesophageal reflux disease)    Hyperlipidemia    Hypertension    PAF (paroxysmal atrial fibrillation) (HCC)    PVC's (premature ventricular contractions)    PVD (peripheral vascular disease) ([place])    Severe sleep apnea    Stroke [facility])     Past Surgical History:  Procedure Laterality Date   BONE ANCHORED HEARING AID IMPLANT Left 06/25/2013   Procedure: BONE ANCHORED HEARING AID (BAHA)  IMPLANT;  Surgeon: Fannie Knee, MD;  Location: [facility];  Service: ENT;  Laterality: Left;   CAROTID STENT INSERTION Left    COCHLEAR IMPLANT     CORONARY STENT PLACEMENT     MASS EXCISION Left 06/25/2013   Procedure: EXCISION LEFT TEMPORAL MASS;  Surgeon: Fannie Knee, MD;  Location: [place];  Service: ENT;  Laterality: Left;   RIGHT HEART CATH N/A 03/26/2022   Procedure: RIGHT HEART CATH;  Surgeon: Lorretta Harp, MD;  Location: [place] CV LAB;  Service: Cardiovascular;  Laterality: N/A;   TONSILLECTOMY       Home Medications:  Prior to Admission medications   Medication Sig Start Date End Date Taking? Authorizing Provider  albuterol (VENTOLIN HFA) 108 (90 Base) MCG/ACT inhaler Inhale 2 puffs into the lungs every 6 (six) hours as needed for wheezing or shortness of breath. 12/20/20   Pokhrel, Corrie Mckusick, MD  allopurinol (ZYLOPRIM) 300 MG tablet Take 300 mg by mouth daily. 06/21/12   [provider]  ALPRAZolam Duanne Moron) 0.25 MG tablet Take 1 tablet (0.25 mg total) by mouth 2 (two) times daily as needed for anxiety. 02/05/22   Kathie Dike, MD  apixaban (ELIQUIS) 2.5 MG TABS tablet Take 1 tablet (2.5 mg total) by mouth 2 (two) times daily. 12/29/21   Little Ishikawa, MD  cholecalciferol (VITAMIN D3) 25 MCG (1000 UNIT) tablet Take 1,000 Units by mouth daily.    [provider]  clopidogrel (PLAVIX) 75 MG tablet Take 1 tablet (75 mg total) by mouth daily. 12/29/21   Little Ishikawa, MD  Cyanocobalamin (VITAMIN B 12 PO) Take 1 tablet by mouth daily.    [provider]  ferrous sulfate 325 (65 FE) MG tablet Take 1 tablet (325 mg total) by mouth 2 (two) times daily with a meal. 03/13/22   Tat, Shanon Brow, MD  folic acid (FOLVITE) 1 MG tablet Take 1 tablet (1 mg total) by mouth daily. 12/29/21   Little Ishikawa, MD  gabapentin (NEURONTIN) 300 MG capsule Take 1 capsule (300 mg total) by mouth at bedtime. 12/29/21   Little Ishikawa,  MD  hydrALAZINE (APRESOLINE) 25 MG tablet Take 1 tablet (25 mg total) by mouth 2 (two) times daily. 03/13/22   Orson Eva, MD  insulin glargine (LANTUS SOLOSTAR) 100 UNIT/ML Solostar Pen Inject 20 Units into the skin daily. Dose per sliding scale. 03/30/22   Domenic Polite, MD  Insulin Pen Needle 32G X 4 MM MISC Use to inject insulin up to 4 times daily. 12/29/21   Little Ishikawa, MD  ipratropium-albuterol (DUONEB) 0.5-2.5 (3) MG/3ML SOLN Take 3 mLs by nebulization 3 (three) times daily. 02/05/22   Kathie Dike, MD  isosorbide dinitrate (ISORDIL) 20 MG tablet Take 1 tablet (20 mg total) by mouth 2 (two) times daily. 12/29/21   Little Ishikawa, MD  metolazone (ZAROXOLYN) 2.5 MG tablet Take 1 tablet (2.5 mg total) by mouth once a week. On Saturdays 04/10/22   Darrick Grinder D, NP  metoprolol succinate (TOPROL-XL) 50 MG 24  hr tablet Take 1 tablet (50 mg total) by mouth daily. Take with or immediately following a meal. 02/06/22   Kathie Dike, MD  mometasone-formoterol (DULERA) 200-5 MCG/ACT AERO Inhale 2 puffs into the lungs in the morning and at bedtime. 02/05/22   Kathie Dike, MD  nitroGLYCERIN (NITROSTAT) 0.4 MG SL tablet     [provider]  pantoprazole (PROTONIX) 40 MG tablet Take 40 mg by mouth daily with breakfast. 08/19/14   [provider]  potassium chloride SA (KLOR-CON M) 20 MEQ tablet Take 1 tablet (20 mEq total) by mouth daily. 04/10/22   Clegg, Amy D, NP  rosuvastatin (CRESTOR) 10 MG tablet Take 1 tablet (10 mg total) by mouth every evening. 12/29/21   Little Ishikawa, MD  sertraline (ZOLOFT) 25 MG tablet Take 1 tablet (25 mg total) by mouth every evening. 01/09/21   Thayer Headings, PMHNP  tamsulosin (FLOMAX) 0.4 MG CAPS capsule Take 0.4 mg by mouth daily. 09/22/14   [provider]  thiamine 100 MG tablet Take 1 tablet (100 mg total) by mouth daily. 12/30/21   Little Ishikawa, MD  torsemide (DEMADEX) 20 MG tablet Take 3 tablets (60 mg total) by  mouth daily. and take extra 20mg  for 3lbs weight gain in 1 day or 5lbs in 1 week 04/10/22   Darrick Grinder D, NP    Inpatient Medications: Scheduled Meds:  apixaban  2.5 mg Oral BID   clopidogrel  75 mg Oral Daily   ferrous sulfate  325 mg Oral BID WC   folic acid  1 mg Oral Daily   gabapentin  300 mg Oral QHS   insulin aspart  0-9 Units Subcutaneous Q4H   insulin glargine-yfgn  10 Units Subcutaneous QHS   ipratropium-albuterol  3 mL Nebulization TID   mometasone-formoterol  2 puff Inhalation BID   pantoprazole  40 mg Oral Q breakfast   potassium chloride  40 mEq Oral BID   rosuvastatin  10 mg Oral QPM   sertraline  25 mg Oral QPM   sodium chloride flush  3 mL Intravenous Q12H   tamsulosin  0.4 mg Oral Daily   thiamine  100 mg Oral Daily   Continuous Infusions:  sodium chloride     furosemide 120 mg (05/14/22 0847)   PRN Meds: sodium chloride, acetaminophen **OR** acetaminophen, albuterol, ALPRAZolam, sodium chloride flush  Allergies:    Allergies  Allergen Reactions   Brilinta [Ticagrelor] Shortness Of Breath   Penicillins Hives   Ezetimibe-Simvastatin Other (See Comments)    Myalgia     Social History:   Social History   Socioeconomic History   Marital status: Widowed    Spouse name: Not on file   Number of children: 3   Years of education: Not on file   Highest education level: Not on file  Occupational History   Occupation: retired  Tobacco Use   Smoking status: Former    Types: Cigarettes    Quit date: 05/19/1989    Years since quitting: 33.0   Smokeless tobacco: Never  Vaping Use   Vaping Use: Never used  Substance and Sexual Activity   Alcohol use: Not Currently   Drug use: No   Sexual activity: Not on file  Other Topics Concern   Not on file  Social History Narrative   Not on file   Social Determinants of Health   Financial Resource Strain: Low Risk  (04/02/2022)   Overall Financial Resource Strain (CARDIA)    Difficulty of Paying Living  Expenses: Not hard at all  Food Insecurity: No Food Insecurity (03/28/2022)   Hunger Vital Sign    Worried About Running Out of Food in the Last Year: Never true    Ran Out of Food in the Last Year: Never true  Transportation Needs: No Transportation Needs (03/28/2022)   PRAPARE - Hydrologist (Medical): No    Lack of Transportation (Non-Medical): No  Physical Activity: Inactive (04/02/2022)   Exercise Vital Sign    Days of Exercise per Week: 0 days    Minutes of Exercise per Session: 0 min  Stress: Stress Concern Present (04/02/2022)   Deport    Feeling of Stress : To some extent  Social Connections: Not on file  Intimate Partner Violence: Not on file    Family History:    Family History  Problem Relation Age of Onset   Diabetes Other    Diabetes Mother    Diabetes Maternal Aunt    Depression Other    Depression Other    Drug abuse Daughter    Diabetes Daughter      ROS:  Please see the history of present illness.  All other ROS reviewed and negative.     Physical Exam/Data:   Vitals:   05/14/22 0800 05/14/22 0815 05/14/22 0835 05/14/22 0849  BP: 105/74  118/79   Pulse: 65 67 76   Resp: 15 17 19    Temp:    97.9 F (36.6 C)  TempSrc:      SpO2: 99% 99% 94%     Intake/Output Summary (Last 24 hours) at 05/14/2022 0926 Last data filed at 05/14/2022 9833 Gross per 24 hour  Intake --  Output 600 ml  Net -600 ml      05/02/2022    1:05 PM 04/27/2022   10:54 AM 04/18/2022    2:17 PM  Last 3 Weights  Weight (lbs) 178 lb 12.8 oz 182 lb 180 lb 12.8 oz  Weight (kg) 81.103 kg 82.555 kg 82.01 kg     There is no height or weight on file to calculate BMI.  General: Well developed, well nourished elderly WM in no acute distress. Head: Normocephalic, atraumatic, sclera non-icteric, no xanthomas, nares are without discharge. Neck: Negative for carotid bruits. JVP not  elevated. Lungs: Markedly diminished throughout with mild basilar crackles. No wheezing or rhonchi. Breathing is unlabored. Heart: Irregular, rate controlled, S1 S2 without murmurs, rubs, or gallops.  Abdomen: Soft, non-tender, non-distended with normoactive bowel sounds. No rebound/guarding. Extremities: No clubbing or cyanosis. No edema. Distal pedal pulses are 2+ and equal bilaterally. Neuro: Alert and oriented X 3. Moves all extremities spontaneously. Psych:  Responds to questions appropriately with a normal affect.   EKG:  The EKG was personally reviewed and demonstrates:  Admit: NSR 78bpm, frequent PVCs, NSIVCD, nonspecific STTW changes today: NSR 71bpm, NSIVCD, nonspecific STTW changes, QTC 437ms  Telemetry:  Telemetry was personally reviewed and demonstrates:  NSR with frequent PVCs, occasional couplets, triplets  Relevant CV Studies: [facility] 03/26/22 HEMODYNAMICS:   Right atrial pressure-14/11 Right ventricular pressure-58/7 Pulmonary artery pressure-59/23, mean 41 Pulmonary capillary wedge pressure-A-wave 28, V wave 32, mean 23 Cardiac output (Fick) 5.3 L/min with an index of 2.7 L/min/m. IMPRESSION: Mr. Grob right atrial pressure, pulmonary capillary wedge pressure (V wave) and pulmonary artery pressure are all elevated suggesting that he is inadequately diuresed.  Recommend continued diuresis.  The Swan-Ganz catheter and sheath were removed and a  pressure dressing applied.  The patient left lab in stable condition.   Quay Burow. MD, [facility] 03/26/2022 9:58 AM   Echo 03/08/22   1. Limited study.   2. Poor acoustic windows.   3. LVEF is severely depressed with hypokinesis worse in the lateral,  anterior and apical walls . Left ventricular ejection fraction, by  estimation, is 25 to 30%. The left ventricle has severely decreased  function. The left ventricular internal cavity  size was moderately dilated. There is mild left ventricular hypertrophy.   4. Right ventricular  systolic function is normal. The right ventricular  size is normal.   5. The inferior vena cava is dilated in size with <50% respiratory  variability, suggesting right atrial pressure of 15 mmHg.  Carotid duplex 08/2014, no hemodynamically significant ICA stenosis bilaterally, <60%. S/P R ICA stenting.     Laboratory Data:  High Sensitivity Troponin:   Recent Labs  Lab 05/13/22 2053 05/13/22 2300 May 20, 2022 0217 05-20-22 0412  TROPONINIHS 84* 107* 115* 110*     Chemistry Recent Labs  Lab 05/13/22 2053 05/20/2022 0217 05-20-22 0224 20-May-2022 0323  NA 132* 133* 131* 131*  K 3.7 3.2* 3.2* 3.2*  CL 90* 91* 89*  --   CO2 29 31  --   --   GLUCOSE 319* 264* 267*  --   BUN 87* 89* 96*  --   CREATININE 3.23* 3.17* 3.20*  --   CALCIUM 9.2 8.9  --   --   MG  --  2.2  --   --   GFRNONAA 18* 19*  --   --   ANIONGAP 13 11  --   --     Recent Labs  Lab 2022-05-20 0217  PROT 6.0*  ALBUMIN 3.0*  AST 13*  ALT 9  ALKPHOS 45  BILITOT 0.9   Lipids No results for input(s): "CHOL", "TRIG", "HDL", "LABVLDL", "LDLCALC", "CHOLHDL" in the last 168 hours.  Hematology Recent Labs  Lab 05/13/22 2053 May 20, 2022 0217 05-20-22 0224 May 20, 2022 0323  WBC 9.9 8.2  --   --   RBC 3.02* 2.83*  2.81*  --   --   HGB 8.6* 8.0* 8.8* 8.8*  HCT 27.8* 25.9* 26.0* 26.0*  MCV 92.1 91.5  --   --   MCH 28.5 28.3  --   --   MCHC 30.9 30.9  --   --   RDW 15.8* 15.6*  --   --   PLT 220 217  --   --    Thyroid  Recent Labs  Lab 2022-05-20 0217  TSH 1.956    BNP Recent Labs  Lab 05/13/22 2053  BNP 939.0*    DDimer No results for input(s): "DDIMER" in the last 168 hours.   Radiology/Studies:  US RENAL  Result Date: 05/20/2022 CLINICAL DATA:  Acute kidney injury EXAM: RENAL / URINARY TRACT ULTRASOUND COMPLETE COMPARISON:  None Available. FINDINGS: Right Kidney: Renal measurements: 12.8 x 6.2 x 6.1 cm = volume: 251 mL. Mildly increased. No mass or hydronephrosis visualized. Lower pole renal cyst  measures 7.1 x 6.8 x 6.0 cm. Left Kidney: Renal measurements: 10.5 x 4.4 x 5.4 cm = volume: 132 mL. Lower pole cyst measures 4.6 x 5.1 x 4.2 cm. Mildly increased echogenicity. No hydronephrosis. Bladder: Appears normal for degree of bladder distention. Other: None. IMPRESSION: 1. No hydronephrosis. Mildly increased echogenicity compatible with medical renal disease. Electronically Signed   By: Ulyses Jarred M.D.   On: May 20, 2022 03:04   DG Chest  2 View  Result Date: 05/13/2022 CLINICAL DATA:  Shortness of breath EXAM: CHEST - 2 VIEW COMPARISON:  03/29/2022 FINDINGS: Diffuse interstitial opacity. Mild cardiomegaly. Small right pleural effusion. IMPRESSION: Mild congestive heart failure Electronically Signed   By: Ulyses Jarred M.D.   On: 05/13/2022 21:31     Assessment and Plan:   1. Shortness of breath - felt multifactorial in setting of acute on chronic HFrEF, COPD with chronic hypoxic respiratory failure - pt also observed to be having coughing after eating so SLP eval pending  2. Acute on chronic HFrEF - last echo 02/2022 showed EF 25-30% with WMA, moderate LV dilation, normal RV, dilated IVC - home regimen includes torsemide 60mg  daily with metolazone 2.5mg  weekly -> transitioned to 120mg  IV BID of Lasix here, got 2 doses thus far - follow volume, renal function with diuresis - per preliminary discussion with Dr. Audie Box, hold on further Lasix pending afternoon BMET after potassium repletion - home hydralazine, isordil held to allow enough BP for diuresis without precipitating ATN - agree with [place] consult given frequent readmissions with complex progressive medical disease, appears IM has requested this  3. CAD s/p prior PCI 2013, NSTEMI 12/2021 tx medically, mildly elevated troponin - previously felt to be a poor invasive candidate given progression in renal insufficiency - maintained on Plavix (also on this for his PAD), this is in addition to Eliquis so not on ASA - continue BB, statin -  elevated trops are low/flat, felt c/w demand ischemia/renal insufficiency  4. AKI on CKD stage IV, urinary retention, hyponatremia, hypokalemia - do not see K has been repleted, will order KCl 72meq q4hr x 2 doses and recheck BMET at 4pm with care order to page Dr. Audie Box with result - follow with diuresis, consider renal consultation  5. PVD s/p prior LE bypass - maintained on chronic Plavix and statin for this per notes  6. Essential HTN - managed in context above  7. Paroxysmal atrial fibrillation, known frequent PVCs - maintaining NSR with frequent PVCs - anticoagulated with Eliquis - continue beta blocker - query whether antiarrhythmic therapy would be helpful for suppression of ectopy  8. Chronic anemia - Hgb 8-8.8 here, said to be chronically 8-9 range - per IM team  Remainder of medical issues per IM  Risk Assessment/Risk Scores:        New York Heart Association (NYHA) Functional Class NYHA Class III-IV  CHA2DS2-VASc Score = 8   This indicates a 10.8% annual risk of stroke. The patient's score is based upon: CHF History: 1 HTN History: 1 Diabetes History: 1 Stroke History: 2 Vascular Disease History: 1 Age Score: 2 Gender Score: 0     For questions or updates, please contact [facility] Please consult [email or web address] for contact info under    Signed, Charlie Pitter, PA-C  05/14/2022 9:26 AM

## 2022-05-14 NOTE — Assessment & Plan Note (Signed)
Continue Flomax 0.4 mg p.o. daily 

## 2022-05-14 NOTE — Consult Note (Signed)
Consultation Note Date: 05/14/2022   Patient Name: John [name]  DOB: Feb 04, 1941  MRN: [id number]  Age / Sex: 81 y.o., male  PCP: [name], DO Referring Physician: Domenic Polite, MD  Reason for Consultation: Establishing goals of care  HPI/Patient Profile: 81 y.o. male  with past medical history of  CAD, chronic systolic heart failure, ICM, paroxysmal atrial fibrillation, hypertension, hyperlipidemia, PVD, CKD stage IV, OSA, CVA, COPD, type 2 diabetes, BPH, GERD, depression, and anxiety  admitted on 05/13/2022 with worsening shortness of breath, dyspnea on exertion.    Patient admitted for acute on chronic combined CHF.  He has had 5 admissions in the past 6 months.  PMT has been consulted to assist with goals of care conversation.  Clinical Assessment and Goals of Care:  I have reviewed medical records including EPIC notes, labs and imaging, received report from RN, assessed the patient and then met at the bedside with patient's daughter, son, daughter-in-law to discuss diagnosis prognosis, GOC, EOL wishes, disposition and options.  I introduced Palliative Medicine as specialized medical care for people living with serious illness. It focuses on providing relief from the symptoms and stress of a serious illness. The goal is to improve quality of life for both the patient and the family.  We discussed a brief life review of the patient and then focused on their current illness.  The natural disease trajectory and expectations at EOL were discussed.  I attempted to elicit values and goals of care important to the patient.    Medical History Review and Understanding:  Reviewed patient's chronic comorbidities with emphasis on his COPD/respiratory failure, heart failure and kidney disease with lack of options.  We discussed that even currently with aggressive measures, care is mainly palliative and not fixing  his problems.  Patient and family verbalized understanding.  Social History: Patient has been a widow since January 02, 2011 when his wife died of stomach cancer.  She was enrolled in hospice at the time.  He has been living with his daughter for the past 2 years.  He has 1 son who is his POA.  He previously enjoyed traveling with his wife and currently spends his time watching TV, playing with Kathy's puppy.  Functional and Nutritional State: Patient is able to bathe and dress himself, although it has been harder for him to do so over the past couple of weeks.  His appetite is on and off but overall worsening since November.  He ambulates using a rollator.  Palliative Symptoms: Dyspnea, pain, anxiety, fatigue  Advance Directives: A detailed discussion regarding advanced directives was had.  His son Robbie is HCPOA.   Code Status: Concepts specific to code status, artifical feeding and hydration, and rehospitalization were considered and discussed.  Recommended continuation of DNR and patient and family agree.  Discussion: Patient states "it has been a mess" when asked to describe his recent quality of life and impact of his illnesses on his physical, emotional, and spiritual wellness.  He states he does not want to continue living this way for a prolonged time.  He has not thought much about hospice.  His family has had discussions with his providers and are in agreement with hospice philosophy and focus on his comfort, allowing the natural disease process to continue while supporting patient with aggressive symptom management rather than prolonging his suffering with continued medical interventions.  He is very disturbed by his inability to urinate.  We reviewed the difficulty in managing his heart failure given his kidney  disease and that his options are limited.  Reviewed locations for hospice support including residential facility, as his prognosis is likely weeks with transition to full comfort care.   His son would like to make sure that the patient is fully aware of his prognosis and accepts this change in focus.  Patient has recently been expressing his desire to fly to [place] to visit family, visit the beach, etc. he is agreeable to PMT reaching back out tomorrow after he is settled in his room with relief of his bladder discomfort.  Patient shares he is on board with anything that his family feels is best for him.  He does like the idea of a facility where hospice can keep an eye on him, as he often gets very anxious when he is home alone and experiencing distressing symptoms.   The difference between aggressive medical intervention and comfort care was considered in light of the patient's goals of care. Hospice and Palliative Care services outpatient were explained and offered.   Discussed the importance of continued conversation with family and the medical providers regarding overall plan of care and treatment options, ensuring decisions are within the context of the patient's values and GOCs.   Questions and concerns were addressed.  Hard Choices booklet left for review. The family was encouraged to call with questions or concerns.  PMT will continue to support holistically.   SUMMARY OF RECOMMENDATIONS   -DNR confirmed -Continue current care for now -PMT will meet again with patient and family tomorrow to further discuss hospice, they are very reasonable and will likely proceed with referral -Spiritual care consult at daughter's request -Psychosocial and emotional support provided -PMT will continue to follow and support   Prognosis:  Weeks to months  Discharge Planning: To Be Determined      Primary Diagnoses: Present on Admission:  Paroxysmal atrial fibrillation ([place])  Type 2 diabetes mellitus with hyperlipidemia (HCC)  Acute on chronic combined systolic (congestive) and diastolic (congestive) heart failure (HCC)  CAD (coronary artery disease) of artery bypass graft   Essential hypertension  CKD (chronic kidney disease) stage 4, GFR 15-29 ml/min (HCC)  Peripheral vascular disease (HCC)  BPH (benign prostatic hyperplasia)  Depression  Chronic respiratory failure with hypoxia (HCC)  OSA (obstructive sleep apnea)  COPD (chronic obstructive pulmonary disease) (HCC)  Elevated troponin  Iron deficiency anemia  Prolonged QT interval  Hyponatremia  Acute renal failure superimposed on stage 4 chronic kidney disease ([place])    Physical Exam Vitals and nursing note reviewed.  Constitutional:      General: He is not in acute distress.    Appearance: He is ill-appearing.  Cardiovascular:     Rate and Rhythm: Normal rate.  Pulmonary:     Effort: Pulmonary effort is normal.  Skin:    General: Skin is warm and dry.  Neurological:     Mental Status: He is alert. Mental status is at baseline.  Psychiatric:        Mood and Affect: Mood normal.        Behavior: Behavior normal.     Vital Signs: BP 105/64   Pulse 77   Temp 98 F (36.7 C)   Resp (!) 22   SpO2 97%  Pain Scale: 0-10   Pain Score: Asleep   SpO2: SpO2: 97 % O2 Device:SpO2: 97 % O2 Flow Rate: .O2 Flow Rate (L/min): 4 L/min   Palliative Assessment/Data:     MDM: High   Keyuna Cuthrell Johnnette Litter, PA-C  Palliative  Medicine Team Team phone # [phone]  Thank you for allowing the Palliative Medicine Team to assist in the care of this patient. Please utilize secure chat with additional questions, if there is no response within 30 minutes please call the above phone number.  Palliative Medicine Team providers are available by phone from 7am to 7pm daily and can be reached through the team cell phone.  Should this patient require assistance outside of these hours, please call the patient's attending physician.

## 2022-05-14 NOTE — Assessment & Plan Note (Addendum)
Titrate oxygen as needed at baseline 4 L of oxygen hypoxia likely secondary combination of CHF and COPD patient is significantly decompensated would likely benefit from palliative care consult

## 2022-05-14 NOTE — Assessment & Plan Note (Signed)
Chronic stable continue albuterol as needed and DuoNeb continue [facility]

## 2022-05-14 NOTE — Assessment & Plan Note (Signed)
Chronic stable if continues to drift down close to 7 will transfuse.  Obtain anemia panel Expect that some of it is dilutional

## 2022-05-14 NOTE — Evaluation (Signed)
Occupational Therapy Treatment Patient Details Name: John [name] MRN: [id number] DOB: 02-Nov-1940 Today's Date: 05/14/2022   History of present illness John [name] is a 81 y.o. male resented with worsening shortness of breath dyspnea on exertion  He reports exertional dyspnea and chest tightness for the past week with a known history of CHF CAD  At baseline wears 4 L of oxygen  with medical history significant of CAD, chronic systolic heart failure, ICM, paroxysmal atrial fibrillation, hypertension, hyperlipidemia, PVD, CKD stage IV, OSA, CVA, COPD, type 2 diabetes, BPH, GERD, depression, and anxiety.   OT comments  Pt currently presents at a min assist level for transfers and simulated selfcare tasks sit to stand without the use of an assistive device.  He exhibits lower activity tolerance than his baseline as well.  Feel he will benefit from acute care OT to progress back to modified independent level for return home with his daughter and PRN supervision.    Recommendations for follow up therapy are one component of a multi-disciplinary discharge planning process, led by the attending physician.  Recommendations may be updated based on patient status, additional functional criteria and insurance authorization.    Follow Up Recommendations  Home health OT    Assistance Recommended at Discharge PRN  Patient can return home with the following  A little help with walking and/or transfers;Assistance with cooking/housework;Assist for transportation;Help with stairs or ramp for entrance;A little help with bathing/dressing/bathroom   Equipment Recommendations  None recommended by OT       Precautions / Restrictions Precautions Precautions: Fall Precaution Comments: Used O2 at home daily. Restrictions Weight Bearing Restrictions: No       Mobility Bed Mobility Overal bed mobility: Modified Independent                  Transfers Overall transfer level: Needs  assistance Equipment used: None Transfers: Sit to/from Stand, Bed to chair/wheelchair/BSC Sit to Stand: Min assist     Step pivot transfers: Min assist     General transfer comment: Pt needing UE support on the bed for transfer around to the other side to grasp his phone.     Balance Overall balance assessment: Needs assistance Sitting-balance support: Feet supported Sitting balance-Leahy Scale: Good     Standing balance support: During functional activity, Reliant on assistive device for balance Standing balance-Leahy Scale: Poor Standing balance comment: Pt needs UE support to maintain balance with mobility.                           ADL either performed or assessed with clinical judgement   ADL Overall ADL's : Needs assistance/impaired Eating/Feeding: Independent;Sitting   Grooming: Wash/dry face;Wash/dry hands;Min guard;Standing Grooming Details (indicate cue type and reason): simulated Upper Body Bathing: Set up;Sitting   Lower Body Bathing: Minimal assistance;Sit to/from stand Lower Body Bathing Details (indicate cue type and reason): simulated Upper Body Dressing : Sitting Upper Body Dressing Details (indicate cue type and reason): simulated Lower Body Dressing: Minimal assistance;Sit to/from stand Lower Body Dressing Details (indicate cue type and reason): donned gripper socks edge of stretcher Toilet Transfer: Minimal assistance;Ambulation Toilet Transfer Details (indicate cue type and reason): simulated Toileting- Clothing Manipulation and Hygiene: Minimal assistance;Sit to/from stand       Functional mobility during ADLs: Minimal assistance General ADL Comments: Pt lives with his daughter and is alone for periods of time during the day.  He reports using a rollator for mobility.    Extremity/Trunk  Assessment Upper Extremity Assessment Upper Extremity Assessment: Overall WFL for tasks assessed            Vision Baseline Vision/History: 0 No  visual deficits Ability to See in Adequate Light: 0 Adequate Patient Visual Report: No change from baseline Vision Assessment?: No apparent visual deficits   Perception Perception Perception: Within Functional Limits   Praxis Praxis Praxis: Intact    Cognition Arousal/Alertness: Lethargic, Suspect due to medications (Pt sleepy) Behavior During Therapy: WFL for tasks assessed/performed Overall Cognitive Status: Within Functional Limits for tasks assessed                                                     Pertinent Vitals/ Pain       Pain Assessment Pain Assessment: No/denies pain  Home Living Family/patient expects to be discharged to:: Private residence Living Arrangements: Children Available Help at Discharge: Available PRN/intermittently Type of Home: House Home Access: Stairs to enter [employer] of Steps: 3 Entrance Stairs-Rails: Left Home Layout: Two level;Able to live on main level with bedroom/bathroom     Bathroom Shower/Tub: Occupational psychologist: Standard     Home Equipment: Cane - single point;Rollator (4 wheels);Shower seat;Wheelchair - manual;Grab bars - tub/shower;Grab bars - toilet;Other (comment) (4 Ls O2)   Additional Comments: daughter works outside of home, pt independent during day          Frequency  Min 2X/week        Progress Toward Goals  OT Goals(current goals can now be found in the care plan section)     Acute Rehab OT Goals Patient Stated Goal: He wants to get his breathing better. OT Goal Formulation: With patient Time For Goal Achievement: 05/28/22 Potential to Achieve Goals: Good  Plan         AM-PAC OT "6 Clicks" Daily Activity     Outcome Measure   Help from another person eating meals?: None Help from another person taking care of personal grooming?: A Little Help from another person toileting, which includes using toliet, bedpan, or urinal?: A Little Help from another  person bathing (including washing, rinsing, drying)?: A Little Help from another person to put on and taking off regular upper body clothing?: None Help from another person to put on and taking off regular lower body clothing?: A Little 6 Click Score: 20    End of Session Equipment Utilized During Treatment: Oxygen  OT Visit Diagnosis: Unsteadiness on feet (R26.81)   Activity Tolerance Patient tolerated treatment well   Patient Left in bed;with call bell/phone within reach   Nurse Communication Mobility status        Time: 3338-3291 OT Time Calculation (min): 30 min  Charges: OT General Charges $OT Visit: 1 Visit OT Evaluation $OT Eval Moderate Complexity: 1 Mod OT Treatments $Self Care/Home Management : 8-22 mins  Allea Kassner OTR/L 05/14/2022, 12:50 PM

## 2022-05-14 NOTE — ED Notes (Signed)
John [name] finished 100% of lunch and fluids. Daughter at bedside. Pt sitting up

## 2022-05-14 NOTE — ED Notes (Signed)
Verified with patient DNR status and patient confirmed, DNR bracelet applied per MD order.

## 2022-05-14 NOTE — Progress Notes (Signed)
Patient admitted to [facility] from ED, patient connected to male pure wick, when he tries to pee it is bloody, per ED report, patient was in and out cath three times between last night and this morning. Patient say it hurts when he pee, MD Broadus John notified of above, see new orders

## 2022-05-14 NOTE — Assessment & Plan Note (Signed)
In the setting of fluid overload.  Obtain urine electrolytes monitor sodium as patient being diuresed Of note some of her fluid overload can be also contributed by hypoalbuminemia

## 2022-05-14 NOTE — ED Notes (Signed)
Patient transported to X-ray 

## 2022-05-14 NOTE — ED Notes (Signed)
Patient asleep in bed at this time. NAD Noted. Cardiac monitor in place, call light in reach.

## 2022-05-14 NOTE — Assessment & Plan Note (Signed)
-  chronic avoid nephrotoxic medications such as NSAIDs, Vanco Zosyn combo,  avoid hypotension, continue to follow renal function  

## 2022-05-14 NOTE — ED Notes (Signed)
I&O cath completed, 659ml of clear yellow urine drained from bladder. Urine collected and sent to lab.

## 2022-05-14 NOTE — Evaluation (Signed)
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)    BPH (benign prostatic hyperplasia)    CAD (coronary artery disease)    Cholesteatoma of attic    Chronic HFrEF (heart failure with reduced ejection fraction) (HCC)    Chronic kidney disease, stage 4 (severe) (HCC)    Depression    Diabetes mellitus (HCC)    Ear disease    GERD (gastroesophageal reflux disease)    Hyperlipidemia    Hypertension    PAF (paroxysmal atrial fibrillation) (HCC)    PVC's (premature ventricular contractions)    PVD (peripheral vascular disease) ([place])    Severe sleep apnea    Stroke [facility])    Past Surgical History:  Past Surgical History:  Procedure Laterality Date   BONE ANCHORED HEARING AID IMPLANT Left 06/25/2013   Procedure: BONE ANCHORED HEARING AID (BAHA) IMPLANT;  Surgeon: Fannie Knee, MD;  Location: [facility];  Service: ENT;  Laterality: Left;   CAROTID STENT INSERTION Left    COCHLEAR IMPLANT     CORONARY STENT PLACEMENT     MASS EXCISION Left 06/25/2013   Procedure: EXCISION LEFT TEMPORAL MASS;  Surgeon: Fannie Knee, MD;  Location: [facility];  Service: ENT;  Laterality: Left;   RIGHT HEART CATH N/A 03/26/2022   Procedure: RIGHT HEART CATH;  Surgeon: Lorretta Harp, MD;  Location: [facility];  Service: Cardiovascular;  Laterality: N/A;   TONSILLECTOMY     HPI:  John [name] is a 81 y.o. male with medical history significant of CAD, chronic systolic heart failure, ICM, paroxysmal atrial fibrillation, hypertension, hyperlipidemia, PVD, CKD stage IV, OSA, CVA, COPD, type 2 diabetes, BPH, GERD, depression, and anxiety. Presented with worsening  shortness of breath dyspnea on exertion. CXR shows mild CHF    Assessment / Plan / Recommendation  Clinical Impression  Pt just completed his breakfast and RN reports he was coughing after most bites/sips and therapist could hear pt coughing outside his room. He has a strong cough with normal labial/lingual ROM and wears partial plates. Pt consumed thin water and coffee and coughed showing signs of potential airway intrusion. He would benefit from further assessment with an MBS which transport is here to pick him up for at this time. SLP Visit Diagnosis: Dysphagia, unspecified (R13.10)    Aspiration Risk  Mild aspiration risk    Diet Recommendation Regular;Thin liquid   Liquid Administration via: Straw;Cup Medication Administration: Whole meds with puree Supervision: Patient able to self feed Compensations: Slow rate;Small sips/bites Postural Changes: Seated upright at 90 degrees    Other  Recommendations Oral Care Recommendations: Oral care BID    Recommendations for follow up therapy are one component of a multi-disciplinary discharge planning process, led by the attending physician.  Recommendations may be updated based on patient status, additional functional criteria and insurance authorization.  Follow up Recommendations        Assistance Recommended at Discharge    Functional Status Assessment    Frequency and Duration            Prognosis        Swallow Study   General Date of Onset: 05/13/22 HPI:  Jakwan Sally is a 81 y.o. male with medical history significant of CAD, chronic systolic heart failure, ICM, paroxysmal atrial fibrillation, hypertension, hyperlipidemia, PVD, CKD stage IV, OSA, CVA, COPD, type 2 diabetes, BPH, GERD, depression, and anxiety. Presented with worsening shortness of breath dyspnea on exertion. CXR shows mild CHF Type of Study: Bedside Swallow Evaluation Previous Swallow Assessment:  (none) Diet Prior to this Study: Regular;Thin  liquids Temperature Spikes Noted: No Respiratory Status: Nasal cannula History of Recent Intubation: No Behavior/Cognition: Alert;Cooperative;Pleasant mood Oral Cavity Assessment: Within Functional Limits Oral Care Completed by SLP: No Oral Cavity - Dentition:  (has partials) Vision: Functional for self-feeding Self-Feeding Abilities: Able to feed self Patient Positioning: Upright in bed Baseline Vocal Quality: Normal Volitional Cough: Strong Volitional Swallow: Able to elicit    Oral/Motor/Sensory Function Overall Oral Motor/Sensory Function: Within functional limits   Ice Chips Ice chips: Not tested   Thin Liquid Thin Liquid: Impaired Presentation: Cup Oral Phase Impairments: Reduced labial seal Pharyngeal  Phase Impairments: Throat Clearing - Immediate;Cough - Immediate    Nectar Thick Nectar Thick Liquid: Not tested   Honey Thick Honey Thick Liquid: Not tested   Puree Puree: Not tested (just finished breakfast)   Solid     Solid: Not tested      Houston Siren 05/14/2022,9:27 AM

## 2022-05-14 NOTE — Assessment & Plan Note (Signed)
On eliquis  continue toprol if BP allows

## 2022-05-14 NOTE — Progress Notes (Signed)
Pt stated he was having some problems right now and would get the nurse to call me when he was ready for the CPAP.

## 2022-05-14 NOTE — Assessment & Plan Note (Signed)
To give more liability for diuresis we will hold off on hydralazine and Isordil for tonight and monitor blood pressure resume as able

## 2022-05-14 NOTE — Subjective & Objective (Signed)
Presents with exertional dyspnea and chest tightness for the past week known history of CHF CAD At baseline wears 4 L of oxygen he has been taking his torsemide as prescribed. Was history of CAD with MI in November 2022 Was not able to undergone cardiac catheterization secondary to CKD No associated fevers or chills

## 2022-05-14 NOTE — Progress Notes (Signed)
Modified Barium Swallow Progress Note  Patient Details  Name: John [name] MRN: [id number] Date of Birth: 07-18-1941  Today's Date: 05/14/2022  Modified Barium Swallow completed.  Full report located under Chart Review in the Imaging Section.  Brief recommendations include the following:  Clinical Impression  Pt exhibits a primary esophageal dysphagia without aspiration during exam. Several times he needed to perform a second effortful swallow to propel barium into UES suspected due to decreased pressures in esophagus. Timing of swallow was normal. He had piecemeal swallows needing two swallows intermittently to send food to posterior oral cavity. One instance thin liquid reached the pyriform sinus before swallow onset which is within normal range. MBS does not diagnose below the level of the UES. Esohageal scan revealed stasis in esophagus with retrograde movement and delayed emptying with barium and barium pill. Thin liquids were needed to propel through the GE juncture. He had several throat clears which is seen from clinical standpoint. Recommend continue regular/thin liquids, pills with liquid and esophageal precautions (stay upright minimum 30 min after meals, drink fluids during meals, eat slowly). No further ST needed.   Swallow Evaluation Recommendations       SLP Diet Recommendations: Regular solids;Thin liquid   Liquid Administration via: Straw;Cup   Medication Administration: Whole meds with liquid   Supervision: Patient able to self feed   Compensations: Slow rate;Small sips/bites   Postural Changes: Remain semi-upright after after feeds/meals (Comment);Seated upright at 90 degrees   Oral Care Recommendations: Oral care BID        Houston Siren 05/14/2022,10:18 AM

## 2022-05-14 NOTE — ED Notes (Signed)
John [name] having dry and frequent cough after eating and drinking. Speech Therapist is now in room with him. He's currently has eaten 100% of breakfast.

## 2022-05-14 NOTE — Assessment & Plan Note (Signed)
Continue CPAP.  

## 2022-05-14 NOTE — H&P (Signed)
John [name] JAS:[id number] DOB: 04-28-1941 DOA: 05/13/2022     PCP: [name], DO   Outpatient Specialists:   CARDS:   Dr. [name]    Patient arrived to ER on 05/13/22 at 2019 Referred by Attending Delora Fuel, MD   Patient coming from:    home Lives With family    Chief Complaint:   Chief Complaint  Patient presents with   Shortness of Breath    CHF    HPI: John [name] is a 81 y.o. male with medical history significant of CAD, chronic systolic heart failure, ICM, paroxysmal atrial fibrillation, hypertension, hyperlipidemia, PVD, CKD stage IV, OSA, CVA, COPD, type 2 diabetes, BPH, GERD, depression, and anxiety     Presented with worsening shortness of breath dyspnea on exertion Presents with exertional dyspnea and chest tightness for the past week known history of CHF CAD At baseline wears 4 L of oxygen he has been taking his torsemide as prescribed. Was history of CAD with MI in November 2022 Was not able to undergone cardiac catheterization secondary to CKD No associated fevers or chills  Past history of MI at age 83 in [place]     Regarding pertinent Chronic problems:     Hyperlipidemia -  on statins Crestor Lipid Panel     Component Value Date/Time   CHOL 99 12/24/2021 2008   TRIG 95 12/24/2021 2008   HDL 51 12/24/2021 2008   CHOLHDL 1.9 12/24/2021 2008   VLDL 19 12/24/2021 2008   [place] 29 12/24/2021 2008     HTN on hydralazine, Isordil, Toprol    chronic CHF diastolic/systolic/ combined -  echo in March 2023 showed EF 35%, mild LV enlargement, mild LVH, G2 DD Torsemide He was hospitalized in April 2023 in the setting of acute systolic heart failure, pleural effusion.  He underwent thoracentesis and was diuresed Readmitted in May 2023 with acute on chronic systolic heart failure, COPD exacerbation.  Repeat limited echo in May 2023 showed EF 25 to 30%, moderately dilated LV, normal RV systolic function.  He underwent further diuresis.   03/24/2019 03/31/2022 in the setting of acute heart failure exacerbation.  Troponin was mildly elevated, this was thought to be due to demand ischemia.  Cardiology was consulted.  He underwent right heart catheterization which showed wedge pressure 23,PA pressures 59/23 consistent with fluid volume overload.  He was diuresed with IV Lasix  Torsemide was increased to 60 mg daily.  Additionally, he was advised to take 2.5 mg metolazone on Saturdays.   SGLT2 inhibitor was considered but was not initiated in the setting of poor hygiene.  Palliative care consult for goals of care was recommended.     CAD  - On   statin, betablocker, Plavix                 -  followed by cardiology                 2013 underwent DES x2-LAD, medical therapy was recommended for the circumflex. March 2023 in the setting of NSTEMI, congestive heart failure, new onset atrial fibrillation. Cardiac catheterization was not pursued due to significant renal insufficiency.  PAD -  s/p femoropopliteal bypass in 2019, followed by [facility].      DM 2 -  Lab Results  Component Value Date   HGBA1C 7.1 (H) 12/24/2021   on insulin, Lantus 20 units        COPD -    on baseline oxygen  4L,  On Dulera   OSA -on nocturnal oxygen,  CPAP,      Hx of CVA -  with/out residual deficits on  Plavix   A. Fib -  - CHA2DS2 vas score  6     current  on anticoagulation with   Eliquis,           -  Rate control:  Currently controlled with  Toprolol,         CKD stage IV- baseline Cr 2.3 Estimated Creatinine Clearance: 17.7 mL/min (A) (by C-G formula based on SCr of 3.23 mg/dL (H)).  Lab Results  Component Value Date   CREATININE 3.23 (H) 05/13/2022   CREATININE 2.33 (H) 04/27/2022   CREATININE 2.42 (H) 04/10/2022         Chronic anemia - baseline hg Hemoglobin & Hematocrit  Recent Labs    03/29/22 0253 03/31/22 0206 05/13/22 2053  HGB 8.3* 9.1* 8.6*     While in ER:   Noted to have troponin elevated at  80-100 Mildly hyponatremic [place] creatinine up to 3.23 Hemoglobin stable Noted to be elevated D-dimer 2.19 but patient is already on Eliquis     CXR -CHF    Following Medications were ordered in ER: Medications  aspirin chewable tablet 324 mg (has no administration in time range)  furosemide (LASIX) 120 mg in dextrose 5 % 50 mL IVPB (has no administration in time range)    _______________________________________________________ ER Provider Called:   Cardiology      ED Triage Vitals  Enc Vitals Group     BP 05/13/22 2040 118/61     Pulse Rate 05/13/22 2040 80     Resp 05/13/22 2040 20     Temp 05/13/22 2040 98.7 F (37.1 C)     Temp Source 05/13/22 2040 Oral     SpO2 05/13/22 2040 98 %     Weight --      Height --      Head Circumference --      Peak Flow --      Pain Score 05/13/22 2042 0     Pain Loc --      Pain Edu? --      Excl. in [place]? --   TMAX(24)@     _________________________________________ Significant initial  Findings: Abnormal Labs Reviewed  BASIC METABOLIC PANEL - Abnormal; Notable for the following components:      Result Value   Sodium 132 (*)    Chloride 90 (*)    Glucose, Bld 319 (*)    BUN 87 (*)    Creatinine, Ser 3.23 (*)    GFR, Estimated 18 (*)    All other components within normal limits  CBC WITH DIFFERENTIAL/PLATELET - Abnormal; Notable for the following components:   RBC 3.02 (*)    Hemoglobin 8.6 (*)    HCT 27.8 (*)    RDW 15.8 (*)    nRBC 0.6 (*)    Neutro Abs 8.6 (*)    Lymphs Abs 0.6 (*)    All other components within normal limits  BRAIN NATRIURETIC PEPTIDE - Abnormal; Notable for the following components:   B Natriuretic Peptide 939.0 (*)    All other components within normal limits  TROPONIN I (HIGH SENSITIVITY) - Abnormal; Notable for the following components:   Troponin I (High Sensitivity) 84 (*)    All other components within normal limits  TROPONIN I (HIGH SENSITIVITY) - Abnormal; Notable for the following  components:   Troponin I (High Sensitivity) 107 (*)  All other components within normal limits     _________________________ Troponin 84 - 107 ECG: Ordered Personally reviewed and interpreted by me showing: HR : 78 Rhythm: Sinus rhythm with frequent Premature ventricular complexes Septal infarct , age undetermined Prolonged QT Abnormal ECG When compared with ECG of 23-Mar-2022 07:34, No significant change was found QTC 501   The recent clinical data is shown below. Vitals:   05/13/22 2242 05/13/22 2244 05/14/22 0003 05/14/22 0030  BP:  120/71 118/63 137/71  Pulse: 80 77 71 76  Resp: 18 18 16 13   Temp:   (!) 97.5 F (36.4 C)   TempSrc:      SpO2: 93% 96% 97% 100%    WBC     Component Value Date/Time   WBC 9.9 05/13/2022 2053   LYMPHSABS 0.6 (L) 05/13/2022 2053   MONOABS 0.6 05/13/2022 2053   EOSABS 0.0 05/13/2022 2053   BASOSABS 0.1 05/13/2022 2053      UA  ordered       _______________________________________________ Hospitalist was called for admission for acute on chronic combined systolic diastolic CHF  The following Work up has been ordered so far:  Orders Placed This Encounter  Procedures   DG Chest 2 View   Basic metabolic panel   CBC with Differential   Brain natriuretic peptide   Cardiac monitoring   Consult to hospitalist   Pulse oximetry (single)   EKG 12-Lead   ED EKG   Insert peripheral IV   Saline lock IV     OTHER Significant initial  Findings:  labs showing:    Recent Labs  Lab 05/13/22 2053  NA 132*  K 3.7  CO2 29  GLUCOSE 319*  BUN 87*  CREATININE 3.23*  CALCIUM 9.2    Cr    Up from baseline see below Lab Results  Component Value Date   CREATININE 3.23 (H) 05/13/2022   CREATININE 2.33 (H) 04/27/2022   CREATININE 2.42 (H) 04/10/2022    No results for input(s): "AST", "ALT", "ALKPHOS", "BILITOT", "PROT", "ALBUMIN" in the last 168 hours. Lab Results  Component Value Date   CALCIUM 9.2 05/13/2022   PHOS 3.8  03/06/2022        Plt: Lab Results  Component Value Date   PLT 220 05/13/2022       COVID-19 Labs  No results for input(s): "DDIMER", "FERRITIN", "LDH", "CRP" in the last 72 hours.  Lab Results  Component Value Date   SARSCOV2NAA NEGATIVE 03/23/2022   SARSCOV2NAA NEGATIVE 01/08/2022   SARSCOV2NAA NEGATIVE 12/24/2021   SARSCOV2NAA NEGATIVE 09/13/2021    Venous  Blood Gas ordered  ABG    Component Value Date/Time   PHART 7.39 02/04/2022 1655   PCO2ART 53 (H) 02/04/2022 1655   PO2ART 42 (L) 02/04/2022 1655   HCO3 30.5 (H) 03/26/2022 0943   HCO3 30.3 (H) 03/26/2022 0943   TCO2 32 03/26/2022 0943   TCO2 32 03/26/2022 0943   ACIDBASEDEF 0.7 12/24/2021 0746   O2SAT 54 03/26/2022 0943   O2SAT 57 03/26/2022 0943       Recent Labs  Lab 05/13/22 2053  WBC 9.9  NEUTROABS 8.6*  HGB 8.6*  HCT 27.8*  MCV 92.1  PLT 220    HG/HCT   stable,     Component Value Date/Time   HGB 8.6 (L) 05/13/2022 2053   HCT 27.8 (L) 05/13/2022 2053   MCV 92.1 05/13/2022 2053    Cardiac Panel (last 3 results) No results for input(s): "CKTOTAL", "CKMB", "TROPONINI", "RELINDX" in  the last 72 hours.  .car BNP (last 3 results) Recent Labs    04/10/22 1107 04/27/22 1125 05/13/22 2053  BNP 777.2* 891.7* 939.0*      DM  labs:  HbA1C: Recent Labs    09/13/21 0756 12/24/21 0746  HGBA1C 8.7* 7.1*       CBG (last 3)  No results for input(s): "GLUCAP" in the last 72 hours.        Cultures:    Component Value Date/Time   SDES  03/08/2022 1115    URINE, CLEAN CATCH Performed at [facility], [address], [place], [place] [address]    [facility]  03/08/2022 1115    NONE Performed at [facility], [address], [place], [place] [address]    CULT >=100,000 COLONIES/mL ENTEROCOCCUS FAECALIS (A) 03/08/2022 1115   REPTSTATUS 03/10/2022 FINAL 03/08/2022 1115     Radiological Exams on Admission: DG Chest 2 View  Result Date: 05/13/2022 CLINICAL DATA:  Shortness of  breath EXAM: CHEST - 2 VIEW COMPARISON:  03/29/2022 FINDINGS: Diffuse interstitial opacity. Mild cardiomegaly. Small right pleural effusion. IMPRESSION: Mild congestive heart failure Electronically Signed   By: Ulyses Jarred M.D.   On: 05/13/2022 21:31   _______________________________________________________________________________________________________ Latest  Blood pressure 137/71, pulse 76, temperature (!) 97.5 F (36.4 C), resp. rate 13, SpO2 100 %.   Vitals  labs and radiology finding personally reviewed  Review of Systems:    Pertinent positives include:  fatigue, Orthopnea shortness of breath at rest. Constitutional:  No weight loss, night sweats, Fevers, chills,  weight loss  HEENT:  No headaches, Difficulty swallowing,Tooth/dental problems,Sore throat,  No sneezing, itching, ear ache, nasal congestion, post nasal drip,  Cardio-vascular:  No chest pain, , PND, anasarca, dizziness, palpitations.no Bilateral lower extremity swelling  GI:  No heartburn, indigestion, abdominal pain, nausea, vomiting, diarrhea, change in bowel habits, loss of appetite, melena, blood in stool, hematemesis Resp:  no  No dyspnea on exertion, No excess mucus, no productive cough, No non-productive cough, No coughing up of blood.No change in color of mucus.No wheezing. Skin:  no rash or lesions. No jaundice GU:  no dysuria, change in color of urine, no urgency or frequency. No straining to urinate.  No flank pain.  Musculoskeletal:  No joint pain or no joint swelling. No decreased range of motion. No back pain.  Psych:  No change in mood or affect. No depression or anxiety. No memory loss.  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)    BPH (benign prostatic hyperplasia)    CAD (coronary artery disease)    CHF (congestive heart failure) (HCC)    Cholesteatoma of attic    Depression    Diabetes mellitus (HCC)    Ear disease    GERD (gastroesophageal reflux disease)    Hyperlipidemia    Hypertension    PVD (peripheral vascular disease) ([place])    Stroke [facility])       Past Surgical History:  Procedure Laterality Date   BONE ANCHORED HEARING AID IMPLANT Left 06/25/2013   Procedure: BONE ANCHORED HEARING AID (BAHA) IMPLANT;  Surgeon: Fannie Knee, MD;  Location: [facility];  Service: ENT;  Laterality: Left;   CAROTID STENT INSERTION Left    COCHLEAR IMPLANT  CORONARY STENT PLACEMENT     MASS EXCISION Left 06/25/2013   Procedure: EXCISION LEFT TEMPORAL MASS;  Surgeon: Fannie Knee, MD;  Location: [facility];  Service: ENT;  Laterality: Left;   RIGHT HEART CATH N/A 03/26/2022   Procedure: RIGHT HEART CATH;  Surgeon: Lorretta Harp, MD;  Location: [facility];  Service: Cardiovascular;  Laterality: N/A;   TONSILLECTOMY      Social History:  Ambulatory  walker        reports that he quit smoking about 33 years ago. His smoking use included cigarettes. He has never used smokeless tobacco. He reports that he does not currently use alcohol. He reports that he does not use drugs.     Family History:   Family History  Problem Relation Age of Onset   Diabetes Other    Diabetes Mother    Diabetes Maternal Aunt    Depression Other    Depression Other    Drug abuse Daughter    Diabetes Daughter    ______________________________________________________________________________________________ Allergies: Allergies  Allergen Reactions   Brilinta [Ticagrelor] Shortness Of Breath   Penicillins Hives   Ezetimibe-Simvastatin Other (See Comments)    Myalgia      Prior to Admission medications    Medication Sig Start Date End Date Taking? Authorizing Provider  albuterol (VENTOLIN HFA) 108 (90 Base) MCG/ACT inhaler Inhale 2 puffs into the lungs every 6 (six) hours as needed for wheezing or shortness of breath. 12/20/20   Pokhrel, Corrie Mckusick, MD  allopurinol (ZYLOPRIM) 300 MG tablet Take 300 mg by mouth daily. 06/21/12   [provider]  ALPRAZolam Duanne Moron) 0.25 MG tablet Take 1 tablet (0.25 mg total) by mouth 2 (two) times daily as needed for anxiety. 02/05/22   Kathie Dike, MD  apixaban (ELIQUIS) 2.5 MG TABS tablet Take 1 tablet (2.5 mg total) by mouth 2 (two) times daily. 12/29/21   Little Ishikawa, MD  cholecalciferol (VITAMIN D3) 25 MCG (1000 UNIT) tablet Take 1,000 Units by mouth daily.    [provider]  clopidogrel (PLAVIX) 75 MG tablet Take 1 tablet (75 mg total) by mouth daily. 12/29/21   Little Ishikawa, MD  Cyanocobalamin (VITAMIN B 12 PO) Take 1 tablet by mouth daily.    [provider]  ferrous sulfate 325 (65 FE) MG tablet Take 1 tablet (325 mg total) by mouth 2 (two) times daily with a meal. 03/13/22   Tat, Shanon Brow, MD  folic acid (FOLVITE) 1 MG tablet Take 1 tablet (1 mg total) by mouth daily. 12/29/21   Little Ishikawa, MD  gabapentin (NEURONTIN) 300 MG capsule Take 1 capsule (300 mg total) by mouth at bedtime. 12/29/21   Little Ishikawa, MD  hydrALAZINE (APRESOLINE) 25 MG tablet Take 1 tablet (25 mg total) by mouth 2 (two) times daily. 03/13/22   Orson Eva, MD  insulin glargine (LANTUS SOLOSTAR) 100 UNIT/ML Solostar Pen Inject 20 Units into the skin daily. Dose per sliding scale. 03/30/22   Domenic Polite, MD  Insulin Pen Needle 32G X 4 MM MISC Use to inject insulin up to 4 times daily. 12/29/21   Little Ishikawa, MD  ipratropium-albuterol (DUONEB) 0.5-2.5 (3) MG/3ML SOLN Take 3 mLs by nebulization 3 (three) times daily. 02/05/22   Kathie Dike, MD  isosorbide dinitrate (ISORDIL) 20 MG tablet Take 1 tablet (20 mg total) by mouth 2  (two) times daily. 12/29/21   Little Ishikawa, MD  metolazone (ZAROXOLYN) 2.5 MG tablet Take  1 tablet (2.5 mg total) by mouth once a week. On Saturdays 04/10/22   Darrick Grinder D, NP  metoprolol succinate (TOPROL-XL) 50 MG 24 hr tablet Take 1 tablet (50 mg total) by mouth daily. Take with or immediately following a meal. 02/06/22   Kathie Dike, MD  mometasone-formoterol (DULERA) 200-5 MCG/ACT AERO Inhale 2 puffs into the lungs in the morning and at bedtime. 02/05/22   Kathie Dike, MD  nitroGLYCERIN (NITROSTAT) 0.4 MG SL tablet     [provider]  pantoprazole (PROTONIX) 40 MG tablet Take 40 mg by mouth daily with breakfast. 08/19/14   [provider]  potassium chloride SA (KLOR-CON M) 20 MEQ tablet Take 1 tablet (20 mEq total) by mouth daily. 04/10/22   Clegg, Amy D, NP  rosuvastatin (CRESTOR) 10 MG tablet Take 1 tablet (10 mg total) by mouth every evening. 12/29/21   Little Ishikawa, MD  sertraline (ZOLOFT) 25 MG tablet Take 1 tablet (25 mg total) by mouth every evening. 01/09/21   Thayer Headings, PMHNP  tamsulosin (FLOMAX) 0.4 MG CAPS capsule Take 0.4 mg by mouth daily. 09/22/14   [provider]  thiamine 100 MG tablet Take 1 tablet (100 mg total) by mouth daily. 12/30/21   Little Ishikawa, MD  torsemide (DEMADEX) 20 MG tablet Take 3 tablets (60 mg total) by mouth daily. and take extra 20mg  for 3lbs weight gain in 1 day or 5lbs in 1 week 04/10/22   Darrick Grinder D, NP    ___________________________________________________________________________________________________ Physical Exam:    05/14/2022   12:30 AM 05/14/2022   12:03 AM 05/13/2022   10:44 PM  Vitals with BMI  Systolic 297 989 211  Diastolic 71 63 71  Pulse 76 71 77     1. General:  in No  Acute distress    Chronically ill   -appearing 2. Psychological: Alert and   Oriented 3. Head/ENT:   Moist   Mucous Membranes                          Head Non traumatic, neck supple                         Poor Dentition 4. SKIN: normal   Skin turgor,  Skin clean Dry and intact no rash 5. Heart: Regular rate and rhythm no  Murmur, no Rub or gallop 6. Lungs:  distant some crackles no wheezes    7. Abdomen: Soft,  non-tender,   distended   obese  bowel sounds present 8. Lower extremities: no clubbing, cyanosis, no  edema 9. Neurologically Grossly intact, moving all 4 extremities equally   10. MSK: Normal range of motion    Chart has been reviewed  ______________________________________________________________________________________________  Assessment/Plan  81 y.o. male with medical history significant of CAD, chronic systolic heart failure, ICM, paroxysmal atrial fibrillation, hypertension, hyperlipidemia, PVD, CKD stage IV, OSA, CVA, COPD, type 2 diabetes, BPH, GERD, depression, and anxiety     Admitted for acute on chronic combined systolic diastolic CHF  Present on Admission:  Paroxysmal atrial fibrillation (HCC)  Type 2 diabetes mellitus with hyperlipidemia ([place])  Acute on chronic combined systolic (congestive) and diastolic (congestive) heart failure (HCC)  CAD (coronary artery disease) of artery bypass graft  Essential hypertension  CKD (chronic kidney disease) stage 4, GFR 15-29 ml/min (HCC)  Peripheral vascular disease (HCC)  BPH (benign prostatic hyperplasia)  Depression  Chronic respiratory failure with  hypoxia (HCC)  OSA (obstructive sleep apnea)  COPD (chronic obstructive pulmonary disease) (HCC)  Elevated troponin  Iron deficiency anemia  Prolonged QT interval  Hyponatremia  Acute renal failure superimposed on stage 4 chronic kidney disease (HCC)     Paroxysmal atrial fibrillation (HCC) On eliquis  continue toprol if BP allows  Type 2 diabetes mellitus with hyperlipidemia (HCC) Decreased dose of LAntus to 10 units qhs with worsening CKD Order sliding scale   CAD (coronary artery disease) of artery bypass graft Continue Plavix 75 mg a day, Toprol 50 mg  daily and Crestor 10 mg p.o. daily  Essential hypertension To give more liability for diuresis we will hold off on hydralazine and Isordil for tonight and monitor blood pressure resume as able  CKD (chronic kidney disease) stage 4, GFR 15-29 ml/min (HCC)  -chronic avoid nephrotoxic medications such as NSAIDs, Vanco Zosyn combo,  avoid hypotension, continue to follow renal function   Peripheral vascular disease ([place]) Continue statin Crestor10 mg p.o. daily, clopidogrel  75 mg p.o. daily and blood pressure control.  Follow up as outpatient  BPH (benign prostatic hyperplasia) Continue Flomax 0.4 mg p.o. daily  Depression Continue with sertraline 25 mg nightly and alprazolam 0.25 as needed twice a day  Acute on chronic combined systolic (congestive) and diastolic (congestive) heart failure (HCC) - Pt diagnosed with CHF based on presence of the following: PND, OA, rales on exam, cardiomegaly, Pulmonary edema on CXR, and   bilateral leg edema pleural effusion  With noted response to IV diuretic in ER  admit on telemetry,  cycle cardiac enzymes 84 105   obtain serial ECG  to evaluate for ischemia as a cause of heart failure  monitor daily weight: There were no vitals filed for this visit. Last BNP BNP (last 3 results) Recent Labs    04/10/22 1107 04/27/22 1125 05/13/22 2053  BNP 777.2* 891.7* 939.0*      diurese with IV lasix 120 mg daily and monitor orthostatics and creatinine to avoid over diuresis. Had recent echogram will not repeat  ACE/ARBi  Contraindicated    cardiology consulted    Chronic respiratory failure with hypoxia (HCC) Titrate oxygen as needed at baseline 4 L of oxygen hypoxia likely secondary combination of CHF and COPD patient is significantly decompensated would likely benefit from palliative care consult  OSA (obstructive sleep apnea) Continue CPAP  COPD (chronic obstructive pulmonary disease) (HCC) Chronic stable continue albuterol as needed and DuoNeb  continue Dulera  Elevated troponin Close to patient's baseline likely in the setting of demand continue to follow appreciate cardiology involvement Will not initiate heparin at this patient is already on Eliquis  Iron deficiency anemia Chronic stable if continues to drift down close to 7 will transfuse.  Obtain anemia panel Expect that some of it is dilutional  Prolonged QT interval - will monitor on tele avoid QT prolonging medications, rehydrate correct electrolytes   Hyponatremia In the setting of fluid overload.  Obtain urine electrolytes monitor sodium as patient being diuresed Of note some of her fluid overload can be also contributed by hypoalbuminemia  Acute renal failure superimposed on stage 4 chronic kidney disease ([place]) Obtain urine electrolytes monitor renal function Obtain renal ultrasound If worsens may need nephrology consult    Other plan as per orders.  DVT prophylaxis:  Eliquis    Code Status:  DNR/DNI   as per patient   I had personally discussed CODE STATUS with patient     Family Communication:   Family  not at  Bedside    Disposition Plan:     likely will need placement for rehabilitation                           Following barriers for discharge:                            Electrolytes corrected                               Anemia  stable                                                         Will need consultants to evaluate patient prior to discharge                       Would benefit from PT/OT eval prior to DC  Ordered                   Swallow eval - SLP ordered                   Diabetes care coordinator                   Transition of care consulted                   Nutrition    consulted                                      Palliative care    consulted                                   Consults called: Cardiology asked ER to consult and will email  Admission status:  ED Disposition     ED Disposition  [place]: [facility] [[id number]]  Level of Care: Telemetry Cardiac [[id number]]  May admit patient to [facility] or [facility] if equivalent level of care is available:: No  Covid Evaluation: Asymptomatic - no recent exposure (last 10 days) testing not required  Diagnosis: Acute on chronic combined systolic (congestive) and diastolic (congestive) heart failure [facility]) [[id number]]  Admitting Physician: Toy Baker [[id number]]  Attending Physician: Toy Baker [[id number]]  Certification:: I certify this patient will need inpatient services for at least 2 midnights  Estimated Length of Stay: 2           inpatient     I Expect 2 midnight stay secondary to severity of patient's current illness need for inpatient interventions justified by the following:  hemodynamic instability despite optimal treatment (tachycardia *hypoxia, )  Severe lab/radiological/exam abnormalities including:   CHF exacerbation hyponatremia and extensive comorbidities including:  DM2   CHF  CAD  COPD/asthma  CKD  Chronic anticoagulation  That are currently affecting medical management.   I expect  patient to be hospitalized for 2 midnights requiring inpatient medical care.  Patient is  at high risk for adverse outcome (such as loss of life or disability) if not treated.  Indication for inpatient stay as follows:    Need for IV diuretics   Level of care         progressive tele indefinitely please discontinue once patient no longer qualifies COVID-19 Labs   Quoc Tome 05/14/2022, 1:44 AM    [facility]     after 2 AM please page floor coverage PA If 7AM-7PM, please contact the day team taking care of the patient using [email or web address]   Patient was evaluated in the context of the global COVID-19 pandemic, which necessitated consideration that the patient might be at risk for infection with the SARS-CoV-2 virus that causes COVID-19. Institutional protocols and  algorithms that pertain to the evaluation of patients at risk for COVID-19 are in a state of rapid change based on information released by regulatory bodies including the CDC and federal and state organizations. These policies and algorithms were followed during the patient's care.

## 2022-05-14 NOTE — ED Provider Notes (Signed)
[facility] EMERGENCY DEPARTMENT Provider Note   CSN: [id number] Arrival date & time: 05/13/22  2019     History  Chief Complaint  Patient presents with   Shortness of Breath    CHF    John [name] is a 81 y.o. male.  The history is provided by the patient.  Shortness of Breath He has history of hypertension, diabetes, hyperlipidemia, chronic kidney disease, coronary artery disease, combined systolic and diastolic heart failure, paroxysmal atrial fibrillation anticoagulated on apixaban and comes in because of shortness of breath for the last 2 days.  Dyspnea is worse with exertion.  He does not lay flat, but feels that dyspnea would be worse if he did lay flat.  He denies chest pain, heaviness, tightness, pressure.  He denies any cough or fever.  He has been compliant with his torsemide 60 mg daily and states that his weight has not gone up.   Home Medications Prior to Admission medications   Medication Sig Start Date End Date Taking? Authorizing Provider  albuterol (VENTOLIN HFA) 108 (90 Base) MCG/ACT inhaler Inhale 2 puffs into the lungs every 6 (six) hours as needed for wheezing or shortness of breath. 12/20/20   Pokhrel, Corrie Mckusick, MD  allopurinol (ZYLOPRIM) 300 MG tablet Take 300 mg by mouth daily. 06/21/12   [provider]  ALPRAZolam Duanne Moron) 0.25 MG tablet Take 1 tablet (0.25 mg total) by mouth 2 (two) times daily as needed for anxiety. 02/05/22   Kathie Dike, MD  apixaban (ELIQUIS) 2.5 MG TABS tablet Take 1 tablet (2.5 mg total) by mouth 2 (two) times daily. 12/29/21   Little Ishikawa, MD  cholecalciferol (VITAMIN D3) 25 MCG (1000 UNIT) tablet Take 1,000 Units by mouth daily.    [provider]  clopidogrel (PLAVIX) 75 MG tablet Take 1 tablet (75 mg total) by mouth daily. 12/29/21   Little Ishikawa, MD  Cyanocobalamin (VITAMIN B 12 PO) Take 1 tablet by mouth daily.    [provider]  ferrous sulfate 325 (65 FE) MG tablet  Take 1 tablet (325 mg total) by mouth 2 (two) times daily with a meal. 03/13/22   Tat, Shanon Brow, MD  folic acid (FOLVITE) 1 MG tablet Take 1 tablet (1 mg total) by mouth daily. 12/29/21   Little Ishikawa, MD  gabapentin (NEURONTIN) 300 MG capsule Take 1 capsule (300 mg total) by mouth at bedtime. 12/29/21   Little Ishikawa, MD  hydrALAZINE (APRESOLINE) 25 MG tablet Take 1 tablet (25 mg total) by mouth 2 (two) times daily. 03/13/22   Orson Eva, MD  insulin glargine (LANTUS SOLOSTAR) 100 UNIT/ML Solostar Pen Inject 20 Units into the skin daily. Dose per sliding scale. 03/30/22   Domenic Polite, MD  Insulin Pen Needle 32G X 4 MM MISC Use to inject insulin up to 4 times daily. 12/29/21   Little Ishikawa, MD  ipratropium-albuterol (DUONEB) 0.5-2.5 (3) MG/3ML SOLN Take 3 mLs by nebulization 3 (three) times daily. 02/05/22   Kathie Dike, MD  isosorbide dinitrate (ISORDIL) 20 MG tablet Take 1 tablet (20 mg total) by mouth 2 (two) times daily. 12/29/21   Little Ishikawa, MD  metolazone (ZAROXOLYN) 2.5 MG tablet Take 1 tablet (2.5 mg total) by mouth once a week. On Saturdays 04/10/22   Darrick Grinder D, NP  metoprolol succinate (TOPROL-XL) 50 MG 24 hr tablet Take 1 tablet (50 mg total) by mouth daily. Take with or immediately following a meal. 02/06/22   Kathie Dike, MD  mometasone-formoterol (DULERA) 200-5 MCG/ACT AERO Inhale 2 puffs into the lungs in the morning and at bedtime. 02/05/22   Kathie Dike, MD  nitroGLYCERIN (NITROSTAT) 0.4 MG SL tablet     [provider]  pantoprazole (PROTONIX) 40 MG tablet Take 40 mg by mouth daily with breakfast. 08/19/14   [provider]  potassium chloride SA (KLOR-CON M) 20 MEQ tablet Take 1 tablet (20 mEq total) by mouth daily. 04/10/22   Clegg, Amy D, NP  rosuvastatin (CRESTOR) 10 MG tablet Take 1 tablet (10 mg total) by mouth every evening. 12/29/21   Little Ishikawa, MD  sertraline (ZOLOFT) 25 MG tablet Take 1 tablet (25 mg total) by  mouth every evening. 01/09/21   Thayer Headings, PMHNP  tamsulosin (FLOMAX) 0.4 MG CAPS capsule Take 0.4 mg by mouth daily. 09/22/14   [provider]  thiamine 100 MG tablet Take 1 tablet (100 mg total) by mouth daily. 12/30/21   Little Ishikawa, MD  torsemide (DEMADEX) 20 MG tablet Take 3 tablets (60 mg total) by mouth daily. and take extra 20mg  for 3lbs weight gain in 1 day or 5lbs in 1 week 04/10/22   Darrick Grinder D, NP      Allergies    Brilinta [ticagrelor], Penicillins, and Ezetimibe-simvastatin    Review of Systems   Review of Systems  Respiratory:  Positive for shortness of breath.   All other systems reviewed and are negative.   Physical Exam Updated Vital Signs BP 137/71   Pulse 76   Temp (!) 97.5 F (36.4 C)   Resp 13   SpO2 100%  Physical Exam Vitals and nursing note reviewed.   81 year old male, resting comfortably and in no acute distress. Vital signs are normal. Oxygen saturation is 100%, which is normal. Head is normocephalic and atraumatic. PERRLA, EOMI. Oropharynx is clear. Neck is nontender and supple without adenopathy or JVD. Back is nontender and there is no CVA tenderness.  There is 1+ presacral edema. Lungs have bibasilar rales about one quarter of the way up.  There are no wheezes or rhonchi. Chest is nontender. Heart has regular rate and rhythm without murmur. Abdomen is soft, flat, nontender without masses or hepatosplenomegaly and peristalsis is normoactive. Extremities have 1+ pedal edema, full range of motion is present. Skin is warm and dry without rash. Neurologic: Mental status is normal, cranial nerves are intact, moves all extremities equally.  ED Results / Procedures / Treatments   Labs (all labs ordered are listed, but only abnormal results are displayed) Labs Reviewed  BASIC METABOLIC PANEL - Abnormal; Notable for the following components:      Result Value   Sodium 132 (*)    Chloride 90 (*)    Glucose, Bld 319 (*)    BUN  87 (*)    Creatinine, Ser 3.23 (*)    GFR, Estimated 18 (*)    All other components within normal limits  CBC WITH DIFFERENTIAL/PLATELET - Abnormal; Notable for the following components:   RBC 3.02 (*)    Hemoglobin 8.6 (*)    HCT 27.8 (*)    RDW 15.8 (*)    nRBC 0.6 (*)    Neutro Abs 8.6 (*)    Lymphs Abs 0.6 (*)    All other components within normal limits  BRAIN NATRIURETIC PEPTIDE - Abnormal; Notable for the following components:   B Natriuretic Peptide 939.0 (*)    All other components within normal limits  TROPONIN I (HIGH SENSITIVITY) -  Abnormal; Notable for the following components:   Troponin I (High Sensitivity) 84 (*)    All other components within normal limits  TROPONIN I (HIGH SENSITIVITY) - Abnormal; Notable for the following components:   Troponin I (High Sensitivity) 107 (*)    All other components within normal limits    EKG EKG Interpretation  Date/Time:  Sunday May 13 2022 20:42:49 EDT Ventricular Rate:  78 PR Interval:  158 QRS Duration: 94 QT Interval:  440 QTC Calculation: 501 R Axis:   68 Text Interpretation: Sinus rhythm with frequent Premature ventricular complexes Septal infarct , age undetermined Prolonged QT Abnormal ECG When compared with ECG of 23-Mar-2022 07:34, No significant change was found Confirmed by Delora Fuel ([id number]) on 05/14/2022 12:26:34 AM  Radiology DG Chest 2 View  Result Date: 05/13/2022 CLINICAL DATA:  Shortness of breath EXAM: CHEST - 2 VIEW COMPARISON:  03/29/2022 FINDINGS: Diffuse interstitial opacity. Mild cardiomegaly. Small right pleural effusion. IMPRESSION: Mild congestive heart failure Electronically Signed   By: Ulyses Jarred M.D.   On: 05/13/2022 21:31    Procedures Procedures  Cardiac monitor shows sinus rhythm with frequent PVCs, per my interpretation.  Medications Ordered in ED Medications  aspirin chewable tablet 324 mg (has no administration in time range)  furosemide (LASIX) 120 mg in dextrose 5 % 50 mL  IVPB (has no administration in time range)    ED Course/ Medical Decision Making/ A&P                           Medical Decision Making Risk OTC drugs. Decision regarding hospitalization.   Shortness of breath with findings strongly suggestive of heart failure exacerbation.  Differential diagnosis includes, but is not limited to, pulmonary embolism, pneumothorax, pleural effusion, pneumonia, severe anemia.  I have personally reviewed and interpreted his ECG, and my interpretation is sinus rhythm with frequent PVCs, borderline prolonged QT interval, unchanged from prior.  I have reviewed and interpreted his laboratory tests and my interpretation is acute on chronic renal failure with creatinine 3.23 compared with 2.33 on 04/27/2022.  Also mild hyponatremia, mild hyperglycemia.  BNP is elevated, but not significantly different from 04/27/2022.  Troponin is mildly elevated at 84 with repeat 107.  This is felt to be most likely demand ischemia rather than ACS.  Moderate anemia with hemoglobin 8.6.  This is a slight drop from 03/31/2022 when hemoglobin was 9.1.  Chest x-ray shows cardiomegaly and bilateral pleural effusions right greater than left with some pulmonary vascular congestion consistent with heart failure.  I have independently viewed the images, and agree with the radiologist's interpretation.  Patient will need hospitalization for diuresis and try to optimize renal function.  I have ordered a dose of aspirin.  I have ordered a dose of intravenous furosemide.  I have not ordered heparin because I do not feel he has ACS, and patient is already anticoagulated on apixaban.  Case is discussed with Dr. Roel Cluck of [facility], who agrees to admit the patient.  Final Clinical Impression(s) / ED Diagnoses Final diagnoses:  Acute on chronic combined systolic and diastolic heart failure (HCC)  Acute renal failure superimposed on chronic kidney disease, unspecified CKD stage, unspecified acute renal  failure type (HCC)  Normochromic normocytic anemia  Hyponatremia  Pleural effusion on right  Elevated troponin    Rx / DC Orders ED Discharge Orders     None         Delora Fuel, MD 24/[birth year]83  0112  

## 2022-05-14 NOTE — Progress Notes (Signed)
Heart Failure Navigator Progress Note  Assessed for Heart & Vascular TOC clinic readiness.  Patient does not meet criteria due to cor morbidities and CKD, per Dr. Broadus John, was seen multiple TOC visits       Earnestine Leys, BSN, RN Heart Failure Nurse Navigator Secure Chat Only

## 2022-05-14 NOTE — Assessment & Plan Note (Signed)
-   will monitor on tele avoid QT prolonging medications, rehydrate correct electrolytes ? ?

## 2022-05-14 NOTE — ED Notes (Signed)
Patient complaining of leg cramps to left calf, MD made aware, order entered for flexeril. Awaiting medication from pharmacy.

## 2022-05-14 NOTE — ED Notes (Signed)
1510: late entry bladder scan done, noted 310 ml in urine

## 2022-05-14 NOTE — Evaluation (Signed)
Physical Therapy Evaluation Patient Details Name: John [name] MRN: [id number] DOB: Dec 09, 1940 Today's Date: 05/14/2022  History of Present Illness  John [name] is a 81 y.o. male resented with worsening shortness of breath dyspnea on exertion  thought to be secondary to CHF. Medical history significant of CAD, CHF, paroxysmal atrial fibrillation, hypertension, hyperlipidemia, PVD, CKD stage IV, OSA, CVA, COPD, type 2 diabetes, BPH, GERD, depression, and anxiety.  Clinical Impression  Pt admitted secondary to problem above with deficits below. Pt requiring min A for bed mobility and to stand and take side steps. Pt with mild unsteadiness and weakness. Pt reports his daughter is working to get more assist at home for him. Would benefit from [facility] support at home along with HHPT to address deficits. Will continue to follow acutely.      Recommendations for follow up therapy are one component of a multi-disciplinary discharge planning process, led by the attending physician.  Recommendations may be updated based on patient status, additional functional criteria and insurance authorization.  Follow Up Recommendations Home health PT [facility] aide, [facility])      Assistance Recommended at Discharge Intermittent Supervision/Assistance  Patient can return home with the following  A little help with walking and/or transfers;A little help with bathing/dressing/bathroom;Assistance with cooking/housework;Help with stairs or ramp for entrance;Assist for transportation    Equipment Recommendations None recommended by PT  Recommendations for Other Services       Functional Status Assessment Patient has had a recent decline in their functional status and demonstrates the ability to make significant improvements in function in a reasonable and predictable amount of time.     Precautions / Restrictions Precautions Precautions: Fall Precaution Comments: Used O2 at home daily. Restrictions Weight  Bearing Restrictions: No      Mobility  Bed Mobility Overal bed mobility: Needs Assistance Bed Mobility: Supine to Sit, Sit to Supine     Supine to sit: Min assist Sit to supine: Supervision   General bed mobility comments: Min A for trunk elevation to come to sitting on stretcher.    Transfers Overall transfer level: Needs assistance Equipment used: 1 person hand held assist Transfers: Sit to/from Stand Sit to Stand: Min assist           General transfer comment: Min A for steadying to stand at EOB. Was able to take side steps at EOB with min A for steadying. Transport staff coming in and needing pt for swallow study, so further mobility deferred.    Ambulation/Gait                  Stairs            Wheelchair Mobility    Modified Rankin (Stroke Patients Only)       Balance Overall balance assessment: Needs assistance Sitting-balance support: Feet supported Sitting balance-Leahy Scale: Good     Standing balance support: During functional activity, Reliant on assistive device for balance Standing balance-Leahy Scale: Poor Standing balance comment: Pt needs UE support to maintain balance with mobility.                             Pertinent Vitals/Pain Pain Assessment Pain Assessment: Faces Pain Score: 7  Faces Pain Scale: Hurts little more Pain Location: BLE (LLE>RLE) Pain Descriptors / Indicators: Aching Pain Intervention(s): Limited activity within patient's tolerance, Monitored during session, Repositioned    Home Living Family/patient expects to be discharged to:: Private residence Living  Arrangements: Children Available Help at Discharge: Available PRN/intermittently Type of Home: House Home Access: Stairs to enter Entrance Stairs-Rails: Left Entrance Stairs-Number of Steps: 3   Home Layout: Two level;Able to live on main level with bedroom/bathroom Home Equipment: Cane - single point;Rollator (4 wheels);Shower  seat;Wheelchair - manual;Grab bars - tub/shower;Grab bars - toilet;Other (comment) (portable oxygen) Additional Comments: daughter works outside of home, pt independent during day    Prior Function Prior Level of Function : Needs assist             Mobility Comments: Reports he uses rollator for mobility ADLs Comments: Has been independent with ADLs, but has been having more difficulty.     Hand Dominance   Dominant Hand: Right    Extremity/Trunk Assessment   Upper Extremity Assessment Upper Extremity Assessment: Defer to OT evaluation    Lower Extremity Assessment Lower Extremity Assessment: Generalized weakness (reports hx of peripheral neuropathy)    Cervical / Trunk Assessment Cervical / Trunk Assessment: Normal  Communication   Communication: No difficulties  Cognition Arousal/Alertness: Awake/alert Behavior During Therapy: WFL for tasks assessed/performed Overall Cognitive Status: Within Functional Limits for tasks assessed                                          General Comments      Exercises     Assessment/Plan    PT Assessment Patient needs continued PT services  PT Problem List Decreased strength;Decreased activity tolerance;Decreased balance;Decreased mobility       PT Treatment Interventions DME instruction;Gait training;Stair training;Functional mobility training;Therapeutic activities;Therapeutic exercise;Balance training;Patient/family education    PT Goals (Current goals can be found in the Care Plan section)  Acute Rehab PT Goals Patient Stated Goal: to go home PT Goal Formulation: With patient Time For Goal Achievement: 05/28/22 Potential to Achieve Goals: Good    Frequency Min 3X/week     Co-evaluation               AM-PAC PT "6 Clicks" Mobility  Outcome Measure Help needed turning from your back to your side while in a flat bed without using bedrails?: None Help needed moving from lying on your back to  sitting on the side of a flat bed without using bedrails?: A Little Help needed moving to and from a bed to a chair (including a wheelchair)?: A Little Help needed standing up from a chair using your arms (e.g., wheelchair or bedside chair)?: A Little Help needed to walk in hospital room?: A Little Help needed climbing 3-5 steps with a railing? : A Lot 6 Click Score: 18    End of Session Equipment Utilized During Treatment: Gait belt Activity Tolerance: Patient tolerated treatment well Patient left: in bed;with call bell/phone within reach;Other (comment) (on stretcher in ED with transport staff present) Nurse Communication: Mobility status PT Visit Diagnosis: Unsteadiness on feet (R26.81);Muscle weakness (generalized) (M62.81)    Time: 1791-5056 PT Time Calculation (min) (ACUTE ONLY): 14 min   Charges:   PT Evaluation $PT Eval Moderate Complexity: 1 Mod          Reuel Derby, PT, DPT  Acute Rehabilitation Services  Office: [phone]   Rudean Hitt 05/14/2022, 1:13 PM

## 2022-05-14 NOTE — ED Notes (Signed)
John [name] return and placed back on monitor, occupational therapy in room.

## 2022-05-14 NOTE — ED Notes (Signed)
Pt resting comfortably at this time. Visible rise and fall of chest noted. Pt appears in NAD.  

## 2022-05-15 ENCOUNTER — Ambulatory Visit: Payer: Medicare Other | Admitting: Cardiology

## 2022-05-15 ENCOUNTER — Encounter: Payer: Self-pay | Admitting: *Deleted

## 2022-05-15 ENCOUNTER — Inpatient Hospital Stay (HOSPITAL_COMMUNITY): Payer: Medicare Other

## 2022-05-15 DIAGNOSIS — I5043 Acute on chronic combined systolic (congestive) and diastolic (congestive) heart failure: Secondary | ICD-10-CM | POA: Diagnosis not present

## 2022-05-15 DIAGNOSIS — J9611 Chronic respiratory failure with hypoxia: Secondary | ICD-10-CM | POA: Diagnosis not present

## 2022-05-15 DIAGNOSIS — Z7189 Other specified counseling: Secondary | ICD-10-CM

## 2022-05-15 DIAGNOSIS — I25810 Atherosclerosis of coronary artery bypass graft(s) without angina pectoris: Secondary | ICD-10-CM | POA: Diagnosis not present

## 2022-05-15 DIAGNOSIS — Z789 Other specified health status: Secondary | ICD-10-CM

## 2022-05-15 DIAGNOSIS — R0602 Shortness of breath: Secondary | ICD-10-CM

## 2022-05-15 DIAGNOSIS — N179 Acute kidney failure, unspecified: Secondary | ICD-10-CM | POA: Diagnosis not present

## 2022-05-15 DIAGNOSIS — Z515 Encounter for palliative care: Secondary | ICD-10-CM

## 2022-05-15 DIAGNOSIS — Z66 Do not resuscitate: Secondary | ICD-10-CM

## 2022-05-15 DIAGNOSIS — N184 Chronic kidney disease, stage 4 (severe): Secondary | ICD-10-CM

## 2022-05-15 LAB — BASIC METABOLIC PANEL
Anion gap: 14 (ref 5–15)
BUN: 86 mg/dL — ABNORMAL HIGH (ref 8–23)
CO2: 28 mmol/L (ref 22–32)
Calcium: 9.4 mg/dL (ref 8.9–10.3)
Chloride: 92 mmol/L — ABNORMAL LOW (ref 98–111)
Creatinine, Ser: 2.98 mg/dL — ABNORMAL HIGH (ref 0.61–1.24)
GFR, Estimated: 20 mL/min — ABNORMAL LOW (ref >60)
Glucose, Bld: 211 mg/dL — ABNORMAL HIGH (ref 70–99)
Potassium: 4.4 mmol/L (ref 3.5–5.1)
Sodium: 134 mmol/L — ABNORMAL LOW (ref 135–145)

## 2022-05-15 LAB — GLUCOSE, CAPILLARY
Glucose-Capillary: 189 mg/dL — ABNORMAL HIGH (ref 70–99)
Glucose-Capillary: 199 mg/dL — ABNORMAL HIGH (ref 70–99)
Glucose-Capillary: 213 mg/dL — ABNORMAL HIGH (ref 70–99)
Glucose-Capillary: 231 mg/dL — ABNORMAL HIGH (ref 70–99)
Glucose-Capillary: 260 mg/dL — ABNORMAL HIGH (ref 70–99)
Glucose-Capillary: 302 mg/dL — ABNORMAL HIGH (ref 70–99)

## 2022-05-15 LAB — CBC
HCT: 28.6 % — ABNORMAL LOW (ref 39.0–52.0)
Hemoglobin: 9 g/dL — ABNORMAL LOW (ref 13.0–17.0)
MCH: 28.6 pg (ref 26.0–34.0)
MCHC: 31.5 g/dL (ref 30.0–36.0)
MCV: 90.8 fL (ref 80.0–100.0)
Platelets: 246 10*3/uL (ref 150–400)
RBC: 3.15 MIL/uL — ABNORMAL LOW (ref 4.22–5.81)
RDW: 15.7 % — ABNORMAL HIGH (ref 11.5–15.5)
WBC: 13.5 10*3/uL — ABNORMAL HIGH (ref 4.0–10.5)
nRBC: 0 % (ref 0.0–0.2)

## 2022-05-15 MED ORDER — ISOSORBIDE MONONITRATE ER 30 MG PO TB24
30.0000 mg | ORAL_TABLET | Freq: Every day | ORAL | Status: DC
  Administered 2022-05-15 – 2022-05-21 (×7): 30 mg via ORAL
  Filled 2022-05-15 (×7): qty 1

## 2022-05-15 MED ORDER — FUROSEMIDE 10 MG/ML IJ SOLN
40.0000 mg | Freq: Once | INTRAMUSCULAR | Status: AC
Start: 2022-05-15 — End: 2022-05-15
  Administered 2022-05-15: 40 mg via INTRAVENOUS
  Filled 2022-05-15: qty 4

## 2022-05-15 MED ORDER — FUROSEMIDE 10 MG/ML IJ SOLN
120.0000 mg | Freq: Two times a day (BID) | INTRAVENOUS | Status: AC
  Administered 2022-05-15 (×2): 120 mg via INTRAVENOUS
  Filled 2022-05-15: qty 2
  Filled 2022-05-15: qty 10

## 2022-05-15 MED ORDER — INSULIN GLARGINE-YFGN 100 UNIT/ML ~~LOC~~ SOLN
20.0000 [IU] | Freq: Every day | SUBCUTANEOUS | Status: DC
  Administered 2022-05-15 – 2022-05-16 (×2): 20 [IU] via SUBCUTANEOUS
  Filled 2022-05-15 (×3): qty 0.2

## 2022-05-15 MED ORDER — HYDRALAZINE HCL 25 MG PO TABS
25.0000 mg | ORAL_TABLET | Freq: Three times a day (TID) | ORAL | Status: DC
  Administered 2022-05-15 – 2022-05-20 (×17): 25 mg via ORAL
  Filled 2022-05-15 (×19): qty 1

## 2022-05-15 MED ORDER — INSULIN ASPART 100 UNIT/ML IJ SOLN
3.0000 [IU] | Freq: Three times a day (TID) | INTRAMUSCULAR | Status: DC
Start: 2022-05-15 — End: 2022-05-17
  Administered 2022-05-15 – 2022-05-17 (×5): 3 [IU] via SUBCUTANEOUS

## 2022-05-15 NOTE — Progress Notes (Signed)
Occupational Therapy Treatment Patient Details Name: John [name] MRN: [id number] DOB: [birth year] Today's Date: 05/15/2022   History of present illness John [name] is a 81 y.o. male resented with worsening shortness of breath dyspnea on exertion  thought to be secondary to CHF. Medical history significant of CAD, CHF, paroxysmal atrial fibrillation, hypertension, hyperlipidemia, PVD, CKD stage IV, OSA, CVA, COPD, type 2 diabetes, BPH, GERD, depression, and anxiety.   OT comments  Patient received in supine.  Patient initially declined therapy stating, "what's the point".  Patient agreed to address toileting with min assist to get to EOB.  Patient required increased time before attempting to stand with min assist and ambulated to bathroom with RW. Patient was able to perform toilet hygiene seated and ambulated to EOB. Patient performed hand hygiene seated on EOB. Patient required verbal cues for PLB and energy conservation. Acute OT to continue to follow.    Recommendations for follow up therapy are one component of a multi-disciplinary discharge planning process, led by the attending physician.  Recommendations may be updated based on patient status, additional functional criteria and insurance authorization.    Follow Up Recommendations  Home health OT    Assistance Recommended at Discharge PRN  Patient can return home with the following  A little help with walking and/or transfers;Assistance with cooking/housework;Assist for transportation;Help with stairs or ramp for entrance;A little help with bathing/dressing/bathroom   Equipment Recommendations  None recommended by OT    Recommendations for Other Services      Precautions / Restrictions Precautions Precautions: Fall Precaution Comments: Used O2 at home daily. Restrictions Weight Bearing Restrictions: No       Mobility Bed Mobility Overal bed mobility: Needs Assistance Bed Mobility: Supine to Sit, Sit to Supine      Supine to sit: Min assist Sit to supine: Min assist   General bed mobility comments: required assistance with trunk    Transfers Overall transfer level: Needs assistance Equipment used: Rolling walker (2 wheels) Transfers: Sit to/from Stand, Bed to chair/wheelchair/BSC Sit to Stand: Min assist           General transfer comment: ambulated from EOB to bathroom for toilet transfer and back to EOB     Balance Overall balance assessment: Needs assistance Sitting-balance support: Feet supported Sitting balance-Leahy Scale: Good     Standing balance support: During functional activity, Reliant on assistive device for balance Standing balance-Leahy Scale: Poor Standing balance comment: reliant on UE support for balance                           ADL either performed or assessed with clinical judgement   ADL Overall ADL's : Needs assistance/impaired     Grooming: Wash/dry hands;Supervision/safety;Sitting Grooming Details (indicate cue type and reason): on EOB                 Toilet Transfer: Minimal assistance;Ambulation;Regular Toilet;Grab bars Toilet Transfer Details (indicate cue type and reason): ambulated to bathroom and instructed on grab bar use to lower and stand from toilet Toileting- Clothing Manipulation and Hygiene: Supervision/safety;Sitting/lateral lean Toileting - Clothing Manipulation Details (indicate cue type and reason): patient performed toilet hygiene seated            Extremity/Trunk Assessment              Vision       Perception     Praxis      Cognition Arousal/Alertness: Awake/alert Behavior During Therapy: [facility] for  tasks assessed/performed Overall Cognitive Status: Within Functional Limits for tasks assessed                                 General Comments: initially declined therapy but agreed to address toileting        Exercises      Shoulder Instructions       General Comments       Pertinent Vitals/ Pain       Pain Assessment Pain Assessment: Faces Faces Pain Scale: Hurts a little bit Pain Location: BLE (LLE>RLE) Pain Descriptors / Indicators: Aching Pain Intervention(s): Monitored during session, Repositioned  Home Living                                          Prior Functioning/Environment              Frequency  Min 2X/week        Progress Toward Goals  OT Goals(current goals can now be found in the care plan section)  Progress towards OT goals: Progressing toward goals  Acute Rehab OT Goals Patient Stated Goal: get better OT Goal Formulation: With patient Time For Goal Achievement: 05/28/22 Potential to Achieve Goals: Good ADL Goals Pt Will Perform Grooming: with modified independence;standing Pt Will Perform Lower Body Bathing: with modified independence;sit to/from stand Pt Will Perform Lower Body Dressing: with modified independence;sit to/from stand Pt Will Transfer to Toilet: with modified independence;ambulating;grab bars;regular height toilet Pt Will Perform Toileting - Clothing Manipulation and hygiene: with modified independence;sit to/from stand Pt Will Perform Tub/Shower Transfer: Shower transfer;with supervision;shower seat;ambulating;rolling walker Additional ADL Goal #1: Pt will verbalize at least two energy conservation strategies for selfcare tasks independently following handout.  Plan Discharge plan remains appropriate    Co-evaluation                 AM-PAC OT "6 Clicks" Daily Activity     Outcome Measure   Help from another person eating meals?: None Help from another person taking care of personal grooming?: A Little Help from another person toileting, which includes using toliet, bedpan, or urinal?: A Little Help from another person bathing (including washing, rinsing, drying)?: A Little Help from another person to put on and taking off regular upper body clothing?: None Help from  another person to put on and taking off regular lower body clothing?: A Little 6 Click Score: 20    End of Session Equipment Utilized During Treatment: Rolling walker (2 wheels);Oxygen  OT Visit Diagnosis: Unsteadiness on feet (R26.81)   Activity Tolerance Patient limited by fatigue   Patient Left in bed;with call bell/phone within reach;with family/visitor present   Nurse Communication Mobility status        Time: 7048-8891 OT Time Calculation (min): 35 min  Charges: OT General Charges $OT Visit: 1 Visit OT Treatments $Self Care/Home Management : 23-37 mins  Lodema Hong, [place]  Office [phone]   Trixie Dredge 05/15/2022, 11:49 AM

## 2022-05-15 NOTE — Progress Notes (Signed)
Mobility Specialist - Progress Note   05/15/22 1400  Mobility  Activity Refused mobility   Pt stating he has moved a bunch today and is tired, asking to come back tomorrow. Will follow up as time allows.   Paulla Dolly Mobility Specialist

## 2022-05-15 NOTE — Social Work (Addendum)
CSW received returned call form [facility]-CSW provided patient's son, Naethan contact name and requested they give him a call. [place] reports if the family decides to move forward with residential hospice- they have no bed available at this time and patient  would be placed on waiting list.   Clinical Social Worker will sign off for now as social work intervention is no longer needed. Please consult [place] if new need arises.    Thurmond Butts, LCSW Clinical Social Worker

## 2022-05-15 NOTE — Progress Notes (Signed)
Cardiology Progress Note  Patient ID: Larnce Schnackenberg MRN: [id number] DOB: October 08, 1941 Date of Encounter: 05/15/2022  Primary Cardiologist: Kirk Ruths, MD  Subjective   Chief Complaint: Shortness of breath  HPI: Feels slightly better.  Kidney function improving.  Continue IV diuresis.  Family discussion today.  ROS:  All other ROS reviewed and negative. Pertinent positives noted in the HPI.     Inpatient Medications  Scheduled Meds:  carvedilol  3.125 mg Oral BID WC   ferrous sulfate  325 mg Oral BID WC   folic acid  1 mg Oral Daily   gabapentin  300 mg Oral QHS   hydrALAZINE  25 mg Oral Q8H   insulin aspart  0-9 Units Subcutaneous Q4H   insulin glargine-yfgn  10 Units Subcutaneous QHS   ipratropium-albuterol  3 mL Nebulization TID   isosorbide mononitrate  30 mg Oral Daily   mometasone-formoterol  2 puff Inhalation BID   pantoprazole  40 mg Oral Q breakfast   rosuvastatin  10 mg Oral QPM   sertraline  25 mg Oral QPM   tamsulosin  0.4 mg Oral Daily   thiamine  100 mg Oral Daily   Continuous Infusions:  furosemide 120 mg (05/15/22 0841)   PRN Meds: acetaminophen **OR** acetaminophen, albuterol, ALPRAZolam   Vital Signs   Vitals:   05/15/22 0126 05/15/22 0322 05/15/22 0749 05/15/22 0806  BP:  (!) 156/73  135/85  Pulse: 91 99  86  Resp: 20 (!) 23  20  Temp:    98.1 F (36.7 C)  TempSrc:      SpO2: 94% 98% 96% 95%  Weight:      Height:        Intake/Output Summary (Last 24 hours) at 05/15/2022 0919 Last data filed at 05/15/2022 0806 Gross per 24 hour  Intake 417 ml  Output 2400 ml  Net -1983 ml      05/15/2022   12:14 AM 05/14/2022    6:00 PM 05/02/2022    1:05 PM  Last 3 Weights  Weight (lbs) 176 lb 9.4 oz 182 lb 5.1 oz 178 lb 12.8 oz  Weight (kg) 80.1 kg 82.7 kg 81.103 kg      Telemetry  Overnight telemetry shows sinus rhythm heart rate in the 90s, frequent PVCs, which I personally reviewed.   Physical Exam   Vitals:   05/15/22 0126 05/15/22 0322  05/15/22 0749 05/15/22 0806  BP:  (!) 156/73  135/85  Pulse: 91 99  86  Resp: 20 (!) 23  20  Temp:    98.1 F (36.7 C)  TempSrc:      SpO2: 94% 98% 96% 95%  Weight:      Height:        Intake/Output Summary (Last 24 hours) at 05/15/2022 0919 Last data filed at 05/15/2022 0806 Gross per 24 hour  Intake 417 ml  Output 2400 ml  Net -1983 ml       05/15/2022   12:14 AM 05/14/2022    6:00 PM 05/02/2022    1:05 PM  Last 3 Weights  Weight (lbs) 176 lb 9.4 oz 182 lb 5.1 oz 178 lb 12.8 oz  Weight (kg) 80.1 kg 82.7 kg 81.103 kg    Body mass index is 27.66 kg/m.  General: Well nourished, well developed, in no acute distress Head: Atraumatic, normal size  Eyes: PEERLA, EOMI  Neck: Supple, JVD 12 to 15 cm of water Endocrine: No thryomegaly Cardiac: Normal S1, S2; RRR; no murmurs, rubs, or gallops Lungs:  Crackles at the lung bases Abd: Soft, nontender, no hepatomegaly  Ext: Trace edema Musculoskeletal: No deformities, BUE and BLE strength normal and equal Skin: Warm and dry, no rashes   Neuro: Alert and oriented to person, place, time, and situation, CNII-XII grossly intact, no focal deficits  Psych: Normal mood and affect   Labs  High Sensitivity Troponin:   Recent Labs  Lab 05/13/22 2053 05/13/22 2300 05/14/22 0217 05/14/22 0412  TROPONINIHS 84* 107* 115* 110*     Cardiac EnzymesNo results for input(s): "TROPONINI" in the last 168 hours. No results for input(s): "TROPIPOC" in the last 168 hours.  Chemistry Recent Labs  Lab 05/14/22 0217 05/14/22 0224 05/14/22 0323 05/14/22 1857 05/15/22 0133  NA 133* 131* 131* 133* 134*  K 3.2* 3.2* 3.2* 3.7 4.4  CL 91* 89*  --  91* 92*  CO2 31  --   --  31 28  GLUCOSE 264* 267*  --  290* 211*  BUN 89* 96*  --  89* 86*  CREATININE 3.17* 3.20*  --  3.20* 2.98*  CALCIUM 8.9  --   --  9.1 9.4  PROT 6.0*  --   --   --   --   ALBUMIN 3.0*  --   --   --   --   AST 13*  --   --   --   --   ALT 9  --   --   --   --   ALKPHOS 45  --    --   --   --   BILITOT 0.9  --   --   --   --   GFRNONAA 19*  --   --  18* 20*  ANIONGAP 11  --   --  11 14    Hematology Recent Labs  Lab 05/13/22 2053 05/14/22 0217 05/14/22 0224 05/14/22 0323 05/15/22 0133  WBC 9.9 8.2  --   --  13.5*  RBC 3.02* 2.83*  2.81*  --   --  3.15*  HGB 8.6* 8.0* 8.8* 8.8* 9.0*  HCT 27.8* 25.9* 26.0* 26.0* 28.6*  MCV 92.1 91.5  --   --  90.8  MCH 28.5 28.3  --   --  28.6  MCHC 30.9 30.9  --   --  31.5  RDW 15.8* 15.6*  --   --  15.7*  PLT 220 217  --   --  246   BNP Recent Labs  Lab 05/13/22 2053  BNP 939.0*    DDimer No results for input(s): "DDIMER" in the last 168 hours.   Radiology  DG CHEST PORT 1 VIEW  Result Date: 05/15/2022 CLINICAL DATA:  Shortness of breath EXAM: PORTABLE CHEST 1 VIEW COMPARISON:  05/13/2022 FINDINGS: Cardiomegaly with moderate interstitial edema, progressive. Moderate right pleural effusion, increased. Associated patchy right upper and lower lobe opacities, likely atelectasis. No pneumothorax. Thoracic aortic atherosclerosis. IMPRESSION: Cardiomegaly with moderate interstitial edema and moderate right pleural effusion, progressive. Associated patchy right upper and lower lobe opacities, likely atelectasis. Electronically Signed   By: Julian Hy M.D.   On: 05/15/2022 03:30   DG Swallowing Func-Speech Pathology  Result Date: 05/14/2022 Table formatting from the original result was not included. Images from the original result were not included. 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)   BPH (benign prostatic hyperplasia)   CAD (coronary artery disease)    Cholesteatoma of attic   Chronic HFrEF (heart failure with reduced ejection fraction) (HCC)   Chronic kidney disease, stage 4 (severe) (HCC)   Depression   Diabetes mellitus (HCC)   Ear disease   GERD (gastroesophageal reflux disease)   Hyperlipidemia   Hypertension   PAF (paroxysmal atrial fibrillation) (HCC)   PVC's (premature ventricular contractions)   PVD (peripheral vascular disease) ([place])   Severe sleep apnea   Stroke [facility])  Past Surgical History: Past Surgical History: Procedure Laterality Date  BONE ANCHORED HEARING AID IMPLANT Left 06/25/2013  Procedure: BONE ANCHORED HEARING AID (BAHA) IMPLANT;  Surgeon: Fannie Knee, MD;  Location: [place];  Service: ENT;  Laterality: Left;  CAROTID STENT INSERTION Left   COCHLEAR IMPLANT    CORONARY STENT PLACEMENT    MASS EXCISION Left 06/25/2013  Procedure: EXCISION LEFT TEMPORAL MASS;  Surgeon: Fannie Knee, MD;  Location: [place];  Service: ENT;  Laterality: Left;  RIGHT HEART CATH N/A 03/26/2022  Procedure: RIGHT HEART CATH;  Surgeon: Lorretta Harp, MD;  Location: [name] [facility];  Service: Cardiovascular;  Laterality: N/A;  TONSILLECTOMY   HPI: Kamareon Sciandra is a 81 y.o. male with medical history significant of CAD, chronic systolic heart failure, ICM, paroxysmal atrial fibrillation, hypertension, hyperlipidemia, PVD, CKD stage IV, OSA, CVA, COPD, type 2 diabetes, BPH, GERD, depression, and anxiety. Presented with worsening shortness of breath dyspnea on exertion. CXR shows mild CHF  No data recorded  Recommendations for follow up therapy are one component of a multi-disciplinary discharge planning process, led by the attending physician.  Recommendations may be updated based on patient status, additional functional criteria and insurance authorization. Assessment / Plan / Recommendation   05/14/2022  10:03 AM Clinical Impressions Clinical Impression Pt exhibits a primary esophageal dysphagia without aspiration during exam.  Several times he needed to perform a second effortful swallow to propel barium into UES suspected due to decreased pressures in esophagus. Timing of swallow was normal. He had piecemeal swallows needing two swallows intermittently to send food to posterior oral cavity. One instance thin liquid reached the pyriform sinus before swallow onset which is within normal range. MBS does not diagnose below the level of the UES. Esohageal scan revealed stasis in esophagus with retrograde movement and delayed emptying with barium and barium pill. Thin liquids were needed to propel through the GE juncture. He had several throat clears which is seen from clinical standpoint. Recommend continue regular/thin liquids, pills with liquid and esophageal precautions (stay upright minimum 30 min after meals, drink fluids during meals, eat slowly). No further ST needed. SLP Visit Diagnosis Dysphagia, unspecified (R13.10) Impact on safety and function Mild aspiration risk     05/14/2022  10:03 AM Treatment Recommendations Treatment Recommendations No treatment recommended at this time      No data to display      05/14/2022  10:03 AM Diet Recommendations SLP Diet Recommendations Regular solids;Thin liquid Liquid Administration via Straw;Cup Medication Administration Whole meds with liquid Compensations Slow rate;Small sips/bites Postural Changes Remain semi-upright after after feeds/meals (Comment);Seated upright at 90 degrees     05/14/2022  10:03 AM Other Recommendations Oral Care Recommendations Oral care BID Follow Up Recommendations No SLP follow up Assistance recommended at discharge None  No data to display        05/14/2022  10:03 AM Oral Phase Oral Phase [facility]    05/14/2022  10:03 AM Pharyngeal Phase Pharyngeal Phase [facility]    05/14/2022  10:03 AM Cervical Esophageal Phase  Cervical Esophageal Phase [facility]FL [place] Siren 05/14/2022, 10:17 AM                     US RENAL  Result Date: 05/14/2022 CLINICAL DATA:  Acute kidney injury  EXAM: RENAL / URINARY TRACT ULTRASOUND COMPLETE COMPARISON:  None Available. FINDINGS: Right Kidney: Renal measurements: 12.8 x 6.2 x 6.1 cm = volume: 251 mL. Mildly increased. No mass or hydronephrosis visualized. Lower pole renal cyst measures 7.1 x 6.8 x 6.0 cm. Left Kidney: Renal measurements: 10.5 x 4.4 x 5.4 cm = volume: 132 mL. Lower pole cyst measures 4.6 x 5.1 x 4.2 cm. Mildly increased echogenicity. No hydronephrosis. Bladder: Appears normal for degree of bladder distention. Other: None. IMPRESSION: 1. No hydronephrosis. Mildly increased echogenicity compatible with medical renal disease. Electronically Signed   By: Ulyses Jarred M.D.   On: 05/14/2022 03:04   DG Chest 2 View  Result Date: 05/13/2022 CLINICAL DATA:  Shortness of breath EXAM: CHEST - 2 VIEW COMPARISON:  03/29/2022 FINDINGS: Diffuse interstitial opacity. Mild cardiomegaly. Small right pleural effusion. IMPRESSION: Mild congestive heart failure Electronically Signed   By: Ulyses Jarred M.D.   On: 05/13/2022 21:31    Cardiac Studies  TTE 03/08/2022   1. Limited study.   2. Poor acoustic windows.   3. LVEF is severely depressed with hypokinesis worse in the lateral,  anterior and apical walls . Left ventricular ejection fraction, by  estimation, is 25 to 30%. The left ventricle has severely decreased  function. The left ventricular internal cavity  size was moderately dilated. There is mild left ventricular hypertrophy.   4. Right ventricular systolic function is normal. The right ventricular  size is normal.   5. The inferior vena cava is dilated in size with <50% respiratory  variability, suggesting right atrial pressure of 15 mmHg.   FINDINGS   Left Ventricle: LVEF is severely depressed with hypokinesis worse in the  lateral, anterior and apical walls. Left ventricular ejection fraction, by  estimation, is 25 to 30%. The left ventricle has severely decreased  function. Definity contrast agent was   given IV to  delineate the left ventricular endocardial borders. The left  ventricular internal cavity size was moderately dilated. There is mild  left ventricular hypertrophy.   Patient Profile  81 year old male with history of COPD, chronic respiratory failure on 4 L O2, heart failure with reduced ejection fraction, paroxysmal atrial fibrillation, PVCs, CAD (PCI to LAD in 2013; non-STEMI 12/2021 managed medically due to her kidney disease), diabetes, CKD stage IV, PAD, OSA, anemia who was admitted on 05/14/2022 with acute on chronic systolic heart failure.  Assessment & Plan   #Acute on chronic systolic heart failure, EF 25-30% #Ischemic cardiomyopathy, not candidate for invasive angiography due to CKD stage IV -Admitted with recurrent volume overload.  Kidney function improving with diuresis.  Continue IV Lasix 120 mg twice daily. -Continue Coreg 3.125 mg twice daily.  On hydralazine 25 mg 3 times daily.  On Imdur 30 mg daily. -Not a candidate for ACE/ARB/ARNI/MRA given significant CKD stage IV. -Limited options.  Recurrent admissions.  Family discussion today.  Likely appropriate for hospice care.  The focus should be comfort.  #Paroxysmal A-fib -Maintaining sinus rhythm.  Continue  beta-blocker.  On Eliquis.  #CAD status post PCI #Recent non-STEMI, not a candidate for invasive angiography #Elevated troponin, demand -Not a candidate for invasive angiography due to significant CKD.  Troponin is minimally elevated and flat.  This is consistent with demand.  Continue Plavix and Eliquis for now.  No symptoms of angina.  Continue Crestor.  #CKD stage IV #COPD #Chronic respiratory failure on home O2 #OSA -Per hospital medicine  For questions or updates, please contact [place] Please consult [email or web address] for contact info under   Signed, [place] T. Audie Box, MD, [place]  05/15/2022 9:19 AM

## 2022-05-15 NOTE — Progress Notes (Signed)
Pt refused CPAP. He stated he would let me know if he felt like he wanted it later on.

## 2022-05-15 NOTE — Patient Outreach (Signed)
[place] [facility]) Care Management  05/15/2022  Demarr Kluever 12/01/1940 [id number]  NP communicated with Dorthy Cooler, PA-C last evening and advised her that Mr. Cockerell has had a Palliative Care Nurse assessment from [facility] and [facility] after his last admission and that that organization needs to receive the referral for palliative care or hospice. She responded back that she would advise her colleague of this information. Also notified, Dominica Severin, ANP, that provided the in home assessment that Mr. Ludington has readmitted and that he will be referred back to them. Suanne Marker and myself had agreed that [facility] would follow until and if pt readmitted so we will be doing a transition.  Eulah Pont. Myrtie Neither, MSN, [facility] Gerontological Nurse Practitioner [facility] Care Management [phone]

## 2022-05-15 NOTE — Progress Notes (Addendum)
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 -Recurrent acute on chronic systolic CHF and AKI on CKD 4 -Cards following, on diuretics, palliative care consulted, discussions on hospice ongoing  Subjective: -Feels better, breathing starting to improve, urinating without a catheter overnight -Developed hematuria after multiple I's/O catheterizations  Assessment and Plan:  Acute on chronic systolic CHF -Last echo 3/55 with EF of 25-30% with wall motion abnormality, ischemic work-up not pursued on account of advanced CKD on torsemide 60 Mg at baseline with weekly metolazone, patient reports compliance with diuretics, daughter cooks his meals, follows low-salt diet -Diuresed with 120 Mg of IV Lasix BID, he is 2 L negative, -Cards following, restarting hydralazine, Imdur, GDMT limited by AKI/CKD -Seen by CHF TOC team multiple times in the last 1 to 2 months -Overall prognosis appears to be poor with worsening cardiomyopathy, progressive kidney disease, frequent hospitalizations, palliative medicine consulted for goals of care, for another meeting today to discuss hospice   History of CAD NSTEMI 3/23 treated medically -Continue  beta-blocker and statin -Holding Plavix and Eliquis today for hematuria   AKI on CKD 4 -Likely cardiorenal, renal ultrasound without hydronephrosis -Avoid hypotension,, monitor urine output, BMP -I do not think he would be a good dialysis candidate -Palliative consulted for goals of care, see discussion above   COPD/chronic respiratory failure -On 4 L home O2  at baseline -Continue DuoNebs, Dulera   Iron deficiency anemia -Chronic, stable, monitor -Given IV iron yesterday, anemia panel with severe deficiency   Type 2 diabetes mellitus -Increase glargine, sliding scale insulin   OSA -Continue CPAP   BPH -Continue Flomax   DVT prophylaxis: Eliquis on hold today Code Status: DNR Family Communication: Discussed patient detail, no family at bedside Disposition Plan: Home likely with hospice, pending further conversations  Consultants:  Cardiology, palliative care  Procedures:   Antimicrobials:    Objective: Vitals:   05/15/22 0322 05/15/22 0749 05/15/22 0806 05/15/22 1136  BP: (!) 156/73  135/85 136/86  Pulse: 99  86 87  Resp: (!) 23  20 20   Temp:   98.1 F (36.7 C) 98.6 F (37 C)  TempSrc:    Oral  SpO2: 98% 96% 95% 93%  Weight:      Height:        Intake/Output Summary (Last 24 hours) at 05/15/2022 1223 Last data filed at 05/15/2022 1138 Gross per 24 hour  Intake 657 ml  Output 2875 ml  Net -2218 ml   Filed Weights   05/14/22 1800 05/15/22 0014  Weight: 82.7 kg 80.1 kg    Examination:  General exam: Chronically ill male sitting up in bed, AAOx3, no distress HEENT: Positive JVD CVS: S1-S2, regular rhythm Lungs: Bilateral Rales Abdomen: Soft, nontender, bowel sounds present Extremities: No edema Skin: No rashes Psychiatry:  Mood & affect appropriate.     Data Reviewed:   CBC: Recent Labs  Lab 05/13/22 2053 05/14/22 0217 05/14/22 0224 05/14/22 0323 05/15/22 0133  WBC 9.9 8.2  --   --  13.5*  NEUTROABS 8.6*  --   --   --   --   HGB 8.6* 8.0*  8.8* 8.8* 9.0*  HCT 27.8* 25.9* 26.0* 26.0* 28.6*  MCV 92.1 91.5  --   --  90.8  PLT 220 217  --   --  086   Basic Metabolic Panel: Recent Labs  Lab 05/13/22 2053 05/14/22 0217 05/14/22 0224 05/14/22 0323 05/14/22 1857 05/15/22 0133  NA 132* 133* 131* 131* 133* 134*  K 3.7 3.2* 3.2* 3.2* 3.7 4.4  CL 90* 91* 89*  --  91* 92*  CO2 29 31  --   --  31  28  GLUCOSE 319* 264* 267*  --  290* 211*  BUN 87* 89* 96*  --  89* 86*  CREATININE 3.23* 3.17* 3.20*  --  3.20* 2.98*  CALCIUM 9.2 8.9  --   --  9.1 9.4  MG  --  2.2  --   --   --   --   PHOS  --  4.5  --   --   --   --    GFR: Estimated Creatinine Clearance: 19 mL/min (A) (by C-G formula based on SCr of 2.98 mg/dL (H)). Liver Function Tests: Recent Labs  Lab 05/14/22 0217  AST 13*  ALT 9  ALKPHOS 45  BILITOT 0.9  PROT 6.0*  ALBUMIN 3.0*   No results for input(s): "LIPASE", "AMYLASE" in the last 168 hours. No results for input(s): "AMMONIA" in the last 168 hours. Coagulation Profile: No results for input(s): "INR", "PROTIME" in the last 168 hours. Cardiac Enzymes: Recent Labs  Lab 05/14/22 0217  CKTOTAL 110   BNP (last 3 results) No results for input(s): "PROBNP" in the last 8760 hours. HbA1C: Recent Labs    05/14/22 0217  HGBA1C 7.2*   CBG: Recent Labs  Lab 05/14/22 2019 05/15/22 0011 05/15/22 0325 05/15/22 0803 05/15/22 1134  GLUCAP 240* 199* 213* 189* 260*   Lipid Profile: No results for input(s): "CHOL", "HDL", "LDLCALC", "TRIG", "CHOLHDL", "LDLDIRECT" in the last 72 hours. Thyroid Function Tests: Recent Labs    05/14/22 0217  TSH 1.956   Anemia Panel: Recent Labs    05/14/22 0217  VITAMINB12 775  FOLATE >40.0  FERRITIN 77  TIBC 413  IRON 27*  RETICCTPCT 3.6*   Urine analysis:    Component Value Date/Time   COLORURINE YELLOW 05/14/2022 0505   APPEARANCEUR CLEAR 05/14/2022 0505   LABSPEC 1.010 05/14/2022 0505   PHURINE 5.0 05/14/2022 0505   GLUCOSEU NEGATIVE 05/14/2022 0505   HGBUR SMALL (A) 05/14/2022 0505   BILIRUBINUR NEGATIVE 05/14/2022 0505   KETONESUR NEGATIVE 05/14/2022 0505   PROTEINUR NEGATIVE 05/14/2022 0505   NITRITE NEGATIVE 05/14/2022 0505   LEUKOCYTESUR NEGATIVE 05/14/2022 0505   Sepsis Labs: @LABRCNTIP (procalcitonin:4,lacticidven:4)  )No results found for this or any previous visit (from the past 240 hour(s)).    Radiology Studies: DG CHEST PORT 1 VIEW  Result Date: 05/15/2022 CLINICAL DATA:  Shortness of breath EXAM: PORTABLE CHEST 1 VIEW COMPARISON:  05/13/2022 FINDINGS: Cardiomegaly with moderate interstitial edema, progressive. Moderate right pleural effusion, increased. Associated patchy right upper and lower lobe opacities, likely atelectasis. No pneumothorax. Thoracic aortic atherosclerosis. IMPRESSION: Cardiomegaly with moderate interstitial edema and moderate right pleural effusion, progressive. Associated patchy right upper and lower lobe opacities, likely atelectasis. Electronically Signed   By: Julian Hy M.D.   On: 05/15/2022 03:30   DG Swallowing Func-Speech Pathology  Result Date: 05/14/2022 Table formatting from the original result was not included. Images from the original result were not included. Objective Swallowing Evaluation: Type of Study: MBS-Modified Barium Swallow Study  Patient Details Name: Blu Lori MRN: [id number] Date of Birth: 1941/05/20 Today's Date: 05/14/2022 Time: SLP Start Time (ACUTE ONLY): 0900 -SLP Stop Time (ACUTE ONLY): 9892 SLP Time Calculation (min) (ACUTE ONLY): 7 min Past Medical History: Past Medical History: Diagnosis Date  Anemia   Anxiety   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)   BPH (benign prostatic hyperplasia)   CAD (coronary artery disease)   Cholesteatoma of attic   Chronic HFrEF (heart failure with reduced ejection fraction) (HCC)   Chronic kidney disease, stage 4 (severe) (HCC)   Depression   Diabetes mellitus (HCC)   Ear disease   GERD (gastroesophageal reflux disease)   Hyperlipidemia   Hypertension   PAF (paroxysmal atrial fibrillation) (HCC)   PVC's (premature ventricular contractions)   PVD (peripheral vascular disease) ([place])   Severe sleep apnea   Stroke [facility])  Past Surgical History: Past Surgical History: Procedure Laterality Date  BONE ANCHORED HEARING AID IMPLANT Left 06/25/2013  Procedure: BONE ANCHORED HEARING AID (BAHA) IMPLANT;  Surgeon: Fannie Knee, MD;   Location: [place];  Service: ENT;  Laterality: Left;  CAROTID STENT INSERTION Left   COCHLEAR IMPLANT    CORONARY STENT PLACEMENT    MASS EXCISION Left 06/25/2013  Procedure: EXCISION LEFT TEMPORAL MASS;  Surgeon: Fannie Knee, MD;  Location: [place];  Service: ENT;  Laterality: Left;  RIGHT HEART CATH N/A 03/26/2022  Procedure: RIGHT HEART CATH;  Surgeon: Lorretta Harp, MD;  Location: [facility];  Service: Cardiovascular;  Laterality: N/A;  TONSILLECTOMY   HPI: Pascal Stiggers is a 81 y.o. male with medical history significant of CAD, chronic systolic heart failure, ICM, paroxysmal atrial fibrillation, hypertension, hyperlipidemia, PVD, CKD stage IV, OSA, CVA, COPD, type 2 diabetes, BPH, GERD, depression, and anxiety. Presented with worsening shortness of breath dyspnea on exertion. CXR shows mild CHF  No data recorded  Recommendations for follow up therapy are one component of a multi-disciplinary discharge planning process, led by the attending physician.  Recommendations may be updated based on patient status, additional functional criteria and insurance authorization. Assessment / Plan / Recommendation   05/14/2022  10:03 AM Clinical Impressions Clinical Impression Pt exhibits a primary esophageal dysphagia without aspiration during exam. Several times he needed to perform a second effortful swallow to propel barium into UES suspected due to decreased pressures in esophagus. Timing of swallow was normal. He had piecemeal swallows needing two swallows intermittently to send food to posterior oral cavity. One instance thin liquid reached the pyriform sinus before swallow onset which is within normal range. MBS does not diagnose below the level of the UES. Esohageal scan revealed stasis in esophagus with retrograde movement and delayed emptying with barium and barium pill. Thin liquids were needed to propel through the GE juncture. He had several throat clears which is seen  from clinical standpoint. Recommend continue regular/thin liquids, pills with liquid and esophageal precautions (stay upright minimum 30 min after meals, drink fluids during meals, eat slowly). No further ST needed. SLP Visit Diagnosis Dysphagia, unspecified (R13.10) Impact on safety and function Mild aspiration risk     05/14/2022  10:03 AM Treatment Recommendations Treatment Recommendations No treatment recommended at this time      No data to display      05/14/2022  10:03 AM Diet Recommendations SLP Diet Recommendations Regular solids;Thin liquid Liquid Administration via Straw;Cup Medication Administration Whole meds with liquid Compensations Slow rate;Small sips/bites Postural Changes Remain semi-upright after after feeds/meals (Comment);Seated upright at 90 degrees  05/14/2022  10:03 AM Other Recommendations Oral Care Recommendations Oral care BID Follow Up Recommendations No SLP follow up Assistance recommended at discharge None    No data to display        05/14/2022  10:03 AM Oral Phase Oral Phase [facility]    05/14/2022  10:03 AM Pharyngeal Phase Pharyngeal Phase [facility]    05/14/2022  10:03 AM Cervical Esophageal Phase  Cervical Esophageal Phase Darryll Capers [place] [facility] 05/14/2022, 10:17 AM                     US RENAL  Result Date: 05/14/2022 CLINICAL DATA:  Acute kidney injury EXAM: RENAL / URINARY TRACT ULTRASOUND COMPLETE COMPARISON:  None Available. FINDINGS: Right Kidney: Renal measurements: 12.8 x 6.2 x 6.1 cm = volume: 251 mL. Mildly increased. No mass or hydronephrosis visualized. Lower pole renal cyst measures 7.1 x 6.8 x 6.0 cm. Left Kidney: Renal measurements: 10.5 x 4.4 x 5.4 cm = volume: 132 mL. Lower pole cyst measures 4.6 x 5.1 x 4.2 cm. Mildly increased echogenicity. No hydronephrosis. Bladder: Appears normal for degree of bladder distention. Other: None. IMPRESSION: 1. No hydronephrosis. Mildly increased echogenicity compatible with medical renal disease. Electronically Signed   By: Ulyses Jarred  M.D.   On: 05/14/2022 03:04   DG Chest 2 View  Result Date: 05/13/2022 CLINICAL DATA:  Shortness of breath EXAM: CHEST - 2 VIEW COMPARISON:  03/29/2022 FINDINGS: Diffuse interstitial opacity. Mild cardiomegaly. Small right pleural effusion. IMPRESSION: Mild congestive heart failure Electronically Signed   By: Ulyses Jarred M.D.   On: 05/13/2022 21:31     Scheduled Meds:  carvedilol  3.125 mg Oral BID WC   ferrous sulfate  325 mg Oral BID WC   folic acid  1 mg Oral Daily   gabapentin  300 mg Oral QHS   hydrALAZINE  25 mg Oral Q8H   insulin aspart  0-9 Units Subcutaneous Q4H   insulin glargine-yfgn  10 Units Subcutaneous QHS   ipratropium-albuterol  3 mL Nebulization TID   isosorbide mononitrate  30 mg Oral Daily   mometasone-formoterol  2 puff Inhalation BID   pantoprazole  40 mg Oral Q breakfast   rosuvastatin  10 mg Oral QPM   sertraline  25 mg Oral QPM   tamsulosin  0.4 mg Oral Daily   thiamine  100 mg Oral Daily   Continuous Infusions:  furosemide 120 mg (05/15/22 0841)     LOS: 1 day    Time spent: 76min    Domenic Polite, MD [facility]   05/15/2022, 12:23 PM

## 2022-05-15 NOTE — Progress Notes (Signed)
Worsening dyspnea with further diminished breath sounds and O2 saturation in 80s on baseline 4L O2. On call physician notified; CXR and 40mg  lasix ordered with verbal order to notify for additional 40mg  if relief is not achieved. Patient reported feeling "100 times better" on follow-up. Patient currently weaned to [place].

## 2022-05-15 NOTE — Social Work (Signed)
Received secured chat from Charmayne Sheer, NP w/Palliative Care. CSW was informed family may be interested in [facility]. Family requested to talk with [facility] to address some questions. CSW called 2x , received no answer, but later spoke with a representative that advised someone will call me back.

## 2022-05-15 NOTE — Progress Notes (Signed)
Nutrition Brief Note  Received consult for assessment of nutrition status.   Per review of chart, noted plans for possible transition to hospice versus residential hospice.   Will follow up as appropriate per [place].   Clayborne Dana, RDN, LDN Clinical Nutrition

## 2022-05-15 NOTE — Consult Note (Signed)
   [facility] Inpatient Consult   05/15/2022  John [name] May 20, 1941 [id number]  [facility] [ACO] Patient: Medicare ACO REACH  Primary Care Provider:  [name], DO  This writer was alerted of patient's admission. Patient is currently active with [facility] for chronic disease management services.  Patient has been engaged by a [facility] NP-Geriatric.  Our community based plan of care has focused on disease management and community resource support.   Patient discussed in unit progression meeting ongoing referrals noted for spiritual care and palliative care. .   Plan: Continue to follow for progress and post hospital needs and update [facility] NP-G.  Of note, [facility] Care Management services does not replace or interfere with any services that are needed or arranged by inpatient [facility] care management team.  For additional questions or referrals please contact:  [name], RN BSN [facility] Liaison  [phone] business mobile phone Toll free office [phone]  Fax number: [phone] [email or web address]

## 2022-05-15 NOTE — Progress Notes (Signed)
Daily Progress Note   Patient Name: John [name]       Date: 05/15/2022 DOB: [birth year]  Age: 81 y.o. MRN#: [id number] Attending Physician: Domenic Polite, MD Primary Care Physician: [name], DO Admit Date: 05/13/2022  Reason for Consultation/Follow-up: Establishing goals of care  Subjective: Chart review performed. Received report from primary RN - no acute concerns.   Went to visit patient at bedside - son/Norm present. Patient was sitting up in chair awake, alert, oriented, and able to participate in conversation. No signs or non-verbal gestures of pain or discomfort noted. No respiratory distress, increased work of breathing, or secretions noted. Patient denies pain or shortness of breath. He reports feeling much better today than he did last night.   Emotional support provided - continued discussions around options for aggressive interventions vs hospice care. Patient and son have a clear understanding of patient's limited options for aggressive care at this time. Answered questions around hospice to include philosophy on medication use and insurance to the best of my ability. Patient expresses he is nervous about transition to hospice facility due to a previous poor experience at a nursing home - education provided on the difference between hospice home vs nursing home. Patient and family are most interested in [facility]. Patient would like to discuss this facility with his daughter, whom has seen it in person. He would also like to speak with hospice liaison prior to making final decisions. Patient does not wish to continue to be hospitalized for his chronic illnesses - he has had 6 admissions in the last 6 months. He is clear he would prefer to focus on quality of life  over quantity.   Patient/son request hospice liaison call son/Norm. Son confirms he is patient's HCPOA.   Son will call PMT this afternoon with final decisions after speaking with family/hospice liaison.  All questions and concerns addressed. Encouraged to call with questions and/or concerns. PMT card provided.  4:50 PM Had not heard back from family. Went back to bedside - no family present and patient asleep. Called son/Norm - he was able to [facility] and family/patient have decided on his transfer to this facility. Per Norm, hospice told him they did not have any beds today. Will notify TOC of decision for residential hospice placement. Discussed with son meeting  again tomorrow for continued [place] discussions around continuing to medically optimize before discharge vs transition to full comfort in house. Norm will call tomorrow with time to meet.   Length of Stay: 1  Current Medications: Scheduled Meds:   carvedilol  3.125 mg Oral BID WC   ferrous sulfate  325 mg Oral BID WC   folic acid  1 mg Oral Daily   gabapentin  300 mg Oral QHS   hydrALAZINE  25 mg Oral Q8H   insulin aspart  0-9 Units Subcutaneous Q4H   insulin glargine-yfgn  10 Units Subcutaneous QHS   ipratropium-albuterol  3 mL Nebulization TID   isosorbide mononitrate  30 mg Oral Daily   mometasone-formoterol  2 puff Inhalation BID   pantoprazole  40 mg Oral Q breakfast   rosuvastatin  10 mg Oral QPM   sertraline  25 mg Oral QPM   tamsulosin  0.4 mg Oral Daily   thiamine  100 mg Oral Daily    Continuous Infusions:  furosemide 120 mg (05/15/22 0841)    PRN Meds: acetaminophen **OR** acetaminophen, albuterol, ALPRAZolam  Physical Exam Vitals and nursing note reviewed.  Constitutional:      General: He is not in acute distress. Pulmonary:     Effort: No respiratory distress.  Skin:    General: Skin is warm and dry.  Neurological:     Mental Status: He is alert and oriented to person, place,  and time.     Motor: Weakness present.  Psychiatric:        Attention and Perception: Attention normal.        Behavior: Behavior is cooperative.        Cognition and Memory: Cognition and memory normal.             Vital Signs: BP 136/86   Pulse 87   Temp 98.6 F (37 C) (Oral)   Resp 20   Ht 5\' 7"  (1.702 m)   Wt 80.1 kg   SpO2 93%   BMI 27.66 kg/m  SpO2: SpO2: 93 % O2 Device: O2 Device: High Flow Nasal Cannula O2 Flow Rate: O2 Flow Rate (L/min): 6 L/min  Intake/output summary:  Intake/Output Summary (Last 24 hours) at 05/15/2022 1156 Last data filed at 05/15/2022 1138 Gross per 24 hour  Intake 657 ml  Output 2875 ml  Net -2218 ml   LBM: Last BM Date : 05/15/22 Baseline Weight: Weight: 82.7 kg Most recent weight: Weight: 80.1 kg       Palliative Assessment/Data: PPS 40%      Patient Active Problem List   Diagnosis Date Noted   Acute on chronic combined systolic (congestive) and diastolic (congestive) heart failure (HCC) 05/14/2022   Elevated troponin 05/14/2022   Iron deficiency anemia 05/14/2022   Prolonged QT interval 05/14/2022   Hyponatremia 05/14/2022   Acute renal failure superimposed on stage 4 chronic kidney disease ([place]) 05/14/2022   Depression 03/24/2022   Thrombocytopenia ([place]) 03/23/2022   Anemia in CKD (chronic kidney disease) 03/13/2022   Anxiety 03/08/2022   Chronic respiratory failure with hypoxia (HCC) 03/06/2022   Pleural effusion, right    Acute urinary retention 01/30/2022   COPD (chronic obstructive pulmonary disease) ([place]) 01/26/2022   CKD (chronic kidney disease) stage 4, GFR 15-29 ml/min (HCC) 01/26/2022   Paroxysmal atrial fibrillation ([place]) 01/26/2022   Diabetes mellitus, type 2 ([place]) 01/09/2022   D-dimer, elevated 12/24/2021   NSTEMI (non-ST elevated myocardial infarction) ([place]) 12/24/2021   Acute on chronic combined systolic and diastolic  CHF (congestive heart failure) ([place]) 09/13/2021   Type 2 diabetes mellitus with  hyperlipidemia ([place]) 09/13/2021   OSA (obstructive sleep apnea) 09/13/2021   BPH (benign prostatic hyperplasia) 09/13/2021   Diabetic neuropathy ([place]) 09/13/2021   Chest pain 82/99/3716   Acute diastolic CHF (congestive heart failure) ([place]) 12/19/2020   Essential hypertension 10/03/2012   CAD (coronary artery disease) of artery bypass graft 06/26/2012   Cardiomyopathy, 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   Peripheral vascular disease ([place]) 06/26/2012    Palliative Care Assessment & Plan   Patient Profile: 81 y.o. male  with past medical history of  CAD, chronic systolic heart failure, ICM, paroxysmal atrial fibrillation, hypertension, hyperlipidemia, PVD, CKD stage IV, OSA, CVA, COPD, type 2 diabetes, BPH, GERD, depression, and anxiety  admitted on 05/13/2022 with worsening shortness of breath, dyspnea on exertion.    Patient admitted for acute on chronic combined CHF.  He has had 5 admissions in the past 6 months.  PMT has been consulted to assist with goals of care conversation.    Assessment: Principal Problem:   Acute on chronic combined systolic (congestive) and diastolic (congestive) heart failure (HCC) Active Problems:   CAD (coronary artery disease) of artery bypass graft   Peripheral vascular disease (HCC)   Essential hypertension   Type 2 diabetes mellitus with hyperlipidemia (HCC)   OSA (obstructive sleep apnea)   BPH (benign prostatic hyperplasia)   COPD (chronic obstructive pulmonary disease) (HCC)   CKD (chronic kidney disease) stage 4, GFR 15-29 ml/min (HCC)   Paroxysmal atrial fibrillation (HCC)   Chronic respiratory failure with hypoxia (HCC)   Depression   Elevated troponin   Iron deficiency anemia   Prolonged QT interval   Hyponatremia   Acute renal failure superimposed on stage 4 chronic kidney disease ([place])   Recommendations/Plan: Continue to treat the treatable for now  Continue DNR/DNI as previously documented - durable DNR form completed and placed in shadow chart.  Copy was made and will be scanned into Vynca/ACP tab Transfer to [facility] when bed available - eval pending; TOC notified and consult placed Family meeting tomorrow 7/26 around goals while in house waiting for hospice bed - medical optimization vs transition to comfort care. Son to call with time PMT will continue to follow and support holistically  Goals of Care and Additional Recommendations: Limitations on Scope of Treatment: Full Scope Treatment  Code Status:    Code Status Orders  (From admission, onward)           Start     Ordered   05/14/22 0141  Do not attempt resuscitation (DNR)  Continuous       Question Answer Comment  In the event of cardiac or respiratory ARREST Do not call a "code blue"   In the event of cardiac or respiratory ARREST Do not perform Intubation, CPR, defibrillation or ACLS   In the event of cardiac or respiratory ARREST Use medication by any route, position, wound care, and other measures to relive pain and suffering. May use oxygen, suction and manual treatment of airway obstruction as needed for comfort.      05/14/22 0140           Code Status History     Date Active Date Inactive Code Status Order ID Comments User Context   03/23/2022 1121 03/31/2022 1838 Full Code [id number]  Norval Morton, MD ED   03/06/2022 2219 03/13/2022 2047 Full Code [id number]  Mansy, Arvella Merles, MD Inpatient   03/06/2022 1436 03/06/2022 2219  DNR [id number]  Barton Dubois, MD ED   01/26/2022 0930 02/06/2022 1424 DNR [id number]  Orson Eva, MD ED   01/26/2022 0553 01/26/2022 0930 Full Code [id number]  Bernadette Hoit, DO ED   01/09/2022 0001 01/10/2022 1834 Full Code [id number]  Carney, [place], DO ED   12/24/2021 0729 12/29/2021 2220 Full Code [id number]  Deatra James, MD ED   09/13/2021 0704 09/15/2021 1818 Full Code [id number]  Bernadette Hoit, DO ED   12/19/2020 1611 12/20/2020 1902 Full Code [id number]  Lequita Halt, MD ED       Prognosis:  Once transitioned to  full comfort/hospice, likely <2 weeks  Discharge Planning: [place] was discussed with primary RN, patient, patient's son, Dr. Broadus John, [facility]  Thank you for allowing the Palliative Medicine Team to assist in the care of this patient.   Total Time 70 minutes Prolonged Time Billed  yes      Greater than 50%  of this time was spent counseling and coordinating care related to the above assessment and plan.  Lin Landsman, NP  Please contact Palliative Medicine Team phone at [phone] for questions and concerns.   *Portions of this note are a verbal dictation therefore any spelling and/or grammatical errors are due to the "[place] One" system interpretation.

## 2022-05-15 NOTE — Progress Notes (Signed)
Physical Therapy Treatment Patient Details Name: John [name] MRN: [id number] DOB: [birth year] Today's Date: 05/15/2022   History of Present Illness John [name] is a 81 y.o. male resented with worsening shortness of breath dyspnea on exertion  thought to be secondary to CHF. Medical history significant of CAD, CHF, paroxysmal atrial fibrillation, hypertension, hyperlipidemia, PVD, CKD stage IV, OSA, CVA, COPD, type 2 diabetes, BPH, GERD, depression, and anxiety.    PT Comments    Pt reports he is having a rough day due to poor sleep due to SOB as well as talks of prognosis and future plans. Pt supervised with transfer from supine to sit. Pt reports that he often gets leg cramps with this and noted to hold breath, talked through breathing pattern to use with exertion. Pt stood with HHA and maintained standing several minutes with noted increased WOB and RR >30. Pt pivoted to recliner for lunch with min A. Pt's son present. PT will continue to follow as pt tolerates.     Recommendations for follow up therapy are one component of a multi-disciplinary discharge planning process, led by the attending physician.  Recommendations may be updated based on patient status, additional functional criteria and insurance authorization.  Follow Up Recommendations  Other (comment) (vs inpt Hospice care)     Assistance Recommended at Discharge Intermittent Supervision/Assistance  Patient can return home with the following A little help with walking and/or transfers;A little help with bathing/dressing/bathroom;Assistance with cooking/housework;Help with stairs or ramp for entrance;Assist for transportation   Equipment Recommendations  None recommended by PT    Recommendations for Other Services       Precautions / Restrictions Precautions Precautions: Fall Precaution Comments: 4L O2 at home Restrictions Weight Bearing Restrictions: No     Mobility  Bed Mobility Overal bed mobility: Needs  Assistance Bed Mobility: Supine to Sit     Supine to sit: Min guard, HOB elevated     General bed mobility comments: able to come to EOB from [facility] elevated without physical assist. Pt reports leg cramps start when he first sits up, discussed not holding breath during transitional mvmts but exhaling through motion, pt voiced understanding    Transfers Overall transfer level: Needs assistance Equipment used: 1 person hand held assist Transfers: Sit to/from Stand, Bed to chair/wheelchair/BSC Sit to Stand: Min assist   Step pivot transfers: Min assist       General transfer comment: turned from bed to recliner to be up for lunch    Ambulation/Gait               General Gait Details: pivot steps with R sided HHA. No LOB but noted increased WOB in standing   Stairs             Wheelchair Mobility    Modified Rankin (Stroke Patients Only)       Balance Overall balance assessment: Needs assistance Sitting-balance support: Feet supported Sitting balance-Leahy Scale: Good     Standing balance support: During functional activity, Reliant on assistive device for balance Standing balance-Leahy Scale: Poor Standing balance comment: reliant on UE support for balance. Pt maintained standing balance several minutes with labored breathing by end of time and need for seated rest break x2 mins for RR to return to <30                            Cognition Arousal/Alertness: Awake/alert Behavior During Therapy: WFL for tasks assessed/performed Overall Cognitive Status: Within  Functional Limits for tasks assessed                                 General Comments: agreed to functional mobility        Exercises      General Comments General comments (skin integrity, edema, etc.): son present, chaplain arrived end of session      Pertinent Vitals/Pain Pain Assessment Pain Assessment: Faces Faces Pain Scale: No hurt    Home Living                           Prior Function            PT Goals (current goals can now be found in the care plan section) Acute Rehab PT Goals Patient Stated Goal: to go home PT Goal Formulation: With patient Time For Goal Achievement: 05/28/22 Potential to Achieve Goals: Good Progress towards PT goals: Not progressing toward goals - comment (emotionally hard day)    Frequency    Min 3X/week      PT Plan Discharge plan needs to be updated    Co-evaluation              AM-PAC PT "6 Clicks" Mobility   Outcome Measure  Help needed turning from your back to your side while in a flat bed without using bedrails?: None Help needed moving from lying on your back to sitting on the side of a flat bed without using bedrails?: A Little Help needed moving to and from a bed to a chair (including a wheelchair)?: A Little Help needed standing up from a chair using your arms (e.g., wheelchair or bedside chair)?: A Little Help needed to walk in hospital room?: A Little Help needed climbing 3-5 steps with a railing? : A Lot 6 Click Score: 18    End of Session Equipment Utilized During Treatment: Gait belt Activity Tolerance: Patient tolerated treatment well Patient left: in chair;with call bell/phone within reach;with family/visitor present Nurse Communication: Mobility status PT Visit Diagnosis: Unsteadiness on feet (R26.81);Muscle weakness (generalized) (M62.81)     Time: 2831-5176 PT Time Calculation (min) (ACUTE ONLY): 18 min  Charges:  $Therapeutic Activity: 8-22 mins                     Leighton Roach, PT  Acute Rehab Services Secure chat preferred Office [place] 05/15/2022, 1:36 PM

## 2022-05-15 NOTE — Progress Notes (Signed)
This chaplain responded to PMT consult and the daughter's request for spiritual care in the setting of the Pt. major life transition. The Pt is awake sitting in the bedside recliner. The Pt. son is visiting. The chaplain understands PMT NP-Amber visited earlier.  The chaplain listened reflectively as the Pt. talked about God's love and salvation, if the Pt. chooses Hospice. The chaplain affirmed the places the Pt. has experienced God's love as an example of God's everlasting love.    The chaplain understands the Pt. is listening to the description of Hospice care. The Pt. communicates he wants to be comfortable; free from pain and SOB with opportunities to draw closer to God.   The Pt accepted the chaplain's invitation for prayer and F/U spiritual care.  Chaplain Sallyanne Kuster [phone]

## 2022-05-15 NOTE — Inpatient Diabetes Management (Signed)
Inpatient Diabetes Program Recommendations  AACE/ADA: New Consensus Statement on Inpatient Glycemic Control (2015)  Target Ranges:  Prepandial:   less than 140 mg/dL      Peak postprandial:   less than 180 mg/dL (1-2 hours)      Critically ill patients:  140 - 180 mg/dL   Lab Results  Component Value Date   GLUCAP 189 (H) 05/15/2022   HGBA1C 7.2 (H) 05/14/2022    Review of Glycemic Control  Latest Reference Range & Units 05/14/22 16:41 05/14/22 20:19 05/15/22 00:11 05/15/22 03:25 05/15/22 08:03  Glucose-Capillary 70 - 99 mg/dL 256 (H) 240 (H) 199 (H) 213 (H) 189 (H)  (H): Data is abnormally high Diabetes history: Type 2 DM Outpatient Diabetes medications: Lantus 10-18 units QHS Current orders for Inpatient glycemic control: Semglee 10 units QHS, Novolog 0-9 units Q4H  Inpatient Diabetes Program Recommendations:    If appropriate, consider: -Increasing Semglee 14 units QD -Changing correction to Novolog 0-9 units TID & HS.   Thanks, Bronson Curb, MSN, RNC-OB Diabetes Coordinator [phone] (8a-5p)

## 2022-05-16 ENCOUNTER — Ambulatory Visit (INDEPENDENT_AMBULATORY_CARE_PROVIDER_SITE_OTHER): Payer: Medicare Other | Admitting: Podiatry

## 2022-05-16 DIAGNOSIS — Z91199 Patient's noncompliance with other medical treatment and regimen due to unspecified reason: Secondary | ICD-10-CM

## 2022-05-16 DIAGNOSIS — N184 Chronic kidney disease, stage 4 (severe): Secondary | ICD-10-CM | POA: Diagnosis not present

## 2022-05-16 DIAGNOSIS — J9611 Chronic respiratory failure with hypoxia: Secondary | ICD-10-CM | POA: Diagnosis not present

## 2022-05-16 DIAGNOSIS — I5043 Acute on chronic combined systolic (congestive) and diastolic (congestive) heart failure: Secondary | ICD-10-CM | POA: Diagnosis not present

## 2022-05-16 DIAGNOSIS — N179 Acute kidney failure, unspecified: Secondary | ICD-10-CM | POA: Diagnosis not present

## 2022-05-16 LAB — BASIC METABOLIC PANEL
Anion gap: 11 (ref 5–15)
BUN: 82 mg/dL — ABNORMAL HIGH (ref 8–23)
CO2: 33 mmol/L — ABNORMAL HIGH (ref 22–32)
Calcium: 9.5 mg/dL (ref 8.9–10.3)
Chloride: 94 mmol/L — ABNORMAL LOW (ref 98–111)
Creatinine, Ser: 2.79 mg/dL — ABNORMAL HIGH (ref 0.61–1.24)
GFR, Estimated: 22 mL/min — ABNORMAL LOW (ref >60)
Glucose, Bld: 157 mg/dL — ABNORMAL HIGH (ref 70–99)
Potassium: 3.7 mmol/L (ref 3.5–5.1)
Sodium: 138 mmol/L (ref 135–145)

## 2022-05-16 LAB — CBC
HCT: 28.7 % — ABNORMAL LOW (ref 39.0–52.0)
Hemoglobin: 9.3 g/dL — ABNORMAL LOW (ref 13.0–17.0)
MCH: 29.4 pg (ref 26.0–34.0)
MCHC: 32.4 g/dL (ref 30.0–36.0)
MCV: 90.8 fL (ref 80.0–100.0)
Platelets: 264 10*3/uL (ref 150–400)
RBC: 3.16 MIL/uL — ABNORMAL LOW (ref 4.22–5.81)
RDW: 15.7 % — ABNORMAL HIGH (ref 11.5–15.5)
WBC: 11 10*3/uL — ABNORMAL HIGH (ref 4.0–10.5)
nRBC: 0.5 % — ABNORMAL HIGH (ref 0.0–0.2)

## 2022-05-16 LAB — GLUCOSE, CAPILLARY
Glucose-Capillary: 111 mg/dL — ABNORMAL HIGH (ref 70–99)
Glucose-Capillary: 154 mg/dL — ABNORMAL HIGH (ref 70–99)
Glucose-Capillary: 158 mg/dL — ABNORMAL HIGH (ref 70–99)
Glucose-Capillary: 187 mg/dL — ABNORMAL HIGH (ref 70–99)
Glucose-Capillary: 238 mg/dL — ABNORMAL HIGH (ref 70–99)
Glucose-Capillary: 361 mg/dL — ABNORMAL HIGH (ref 70–99)

## 2022-05-16 MED ORDER — FUROSEMIDE 10 MG/ML IJ SOLN
80.0000 mg | Freq: Two times a day (BID) | INTRAMUSCULAR | Status: DC
  Administered 2022-05-16 – 2022-05-18 (×5): 80 mg via INTRAVENOUS
  Filled 2022-05-16 (×5): qty 8

## 2022-05-16 MED ORDER — DIPHENHYDRAMINE HCL 50 MG/ML IJ SOLN: 12.5000 mg | INTRAMUSCULAR | Status: DC | PRN

## 2022-05-16 MED ORDER — ONDANSETRON HCL 4 MG/2ML IJ SOLN: 4.0000 mg | Freq: Four times a day (QID) | INTRAMUSCULAR | Status: DC | PRN

## 2022-05-16 MED ORDER — POLYVINYL ALCOHOL 1.4 % OP SOLN: 1.0000 [drp] | Freq: Four times a day (QID) | OPHTHALMIC | Status: DC | PRN

## 2022-05-16 MED ORDER — ONDANSETRON 4 MG PO TBDP: 4.0000 mg | ORAL_TABLET | Freq: Four times a day (QID) | ORAL | Status: DC | PRN

## 2022-05-16 MED ORDER — LORAZEPAM 2 MG/ML PO CONC: 1.0000 mg | ORAL | Status: DC | PRN

## 2022-05-16 MED ORDER — DEXTROSE 5 % IV SOLN
120.0000 mg | Freq: Two times a day (BID) | INTRAVENOUS | Status: DC
Start: 2022-05-16 — End: 2022-05-16
  Filled 2022-05-16 (×2): qty 12

## 2022-05-16 MED ORDER — HYDROMORPHONE HCL 1 MG/ML IJ SOLN
0.5000 mg | INTRAMUSCULAR | Status: DC | PRN
  Administered 2022-05-16 – 2022-05-17 (×2): 0.5 mg via INTRAVENOUS
  Administered 2022-05-18 (×2): 1 mg via INTRAVENOUS
  Administered 2022-05-19: 0.5 mg via INTRAVENOUS
  Administered 2022-05-19: 1 mg via INTRAVENOUS
  Administered 2022-05-19: 0.5 mg via INTRAVENOUS
  Administered 2022-05-20 – 2022-05-21 (×4): 1 mg via INTRAVENOUS
  Filled 2022-05-16: qty 1
  Filled 2022-05-16: qty 0.5
  Filled 2022-05-16: qty 1
  Filled 2022-05-16: qty 0.5
  Filled 2022-05-16 (×2): qty 1
  Filled 2022-05-16: qty 0.5
  Filled 2022-05-16 (×4): qty 1

## 2022-05-16 MED ORDER — GLYCOPYRROLATE 0.2 MG/ML IJ SOLN: 0.2000 mg | INTRAMUSCULAR | Status: DC | PRN

## 2022-05-16 MED ORDER — BIOTENE DRY MOUTH MT LIQD
15.0000 mL | Freq: Two times a day (BID) | OROMUCOSAL | Status: DC
  Administered 2022-05-17 – 2022-05-21 (×6): 15 mL via TOPICAL

## 2022-05-16 MED ORDER — GLYCOPYRROLATE 1 MG PO TABS: 1.0000 mg | ORAL_TABLET | ORAL | Status: DC | PRN

## 2022-05-16 MED ORDER — FUROSEMIDE 10 MG/ML IJ SOLN: 80.0000 mg | Freq: Once | INTRAMUSCULAR | Status: DC

## 2022-05-16 MED ORDER — HALOPERIDOL 1 MG PO TABS: 2.0000 mg | ORAL_TABLET | Freq: Four times a day (QID) | ORAL | Status: DC | PRN

## 2022-05-16 MED ORDER — LORAZEPAM 1 MG PO TABS
1.0000 mg | ORAL_TABLET | ORAL | Status: DC | PRN
  Administered 2022-05-20: 1 mg via ORAL
  Filled 2022-05-16 (×3): qty 1

## 2022-05-16 MED ORDER — LORAZEPAM 2 MG/ML IJ SOLN
1.0000 mg | INTRAMUSCULAR | Status: DC | PRN
  Administered 2022-05-20: 1 mg via INTRAVENOUS
  Filled 2022-05-16: qty 1

## 2022-05-16 MED ORDER — HALOPERIDOL LACTATE 5 MG/ML IJ SOLN: 2.0000 mg | Freq: Four times a day (QID) | INTRAMUSCULAR | Status: DC | PRN

## 2022-05-16 MED ORDER — HALOPERIDOL LACTATE 2 MG/ML PO CONC: 2.0000 mg | Freq: Four times a day (QID) | ORAL | Status: DC | PRN

## 2022-05-16 NOTE — TOC Progression Note (Addendum)
Transition of Care [facility]) - Progression Note    Patient Details  Name: John [name] MRN: [id number] Date of Birth: 01-26-1941  Transition of Care [facility]) CM/SW [place], [place] Phone Number: 05/16/2022, 2:57 PM  Clinical Narrative:     CSW is notified by  PMT that family wants to move forward with [facility] facility. Per TOC note yesterday, [facility] was notified that pt's family had questions about their facility and [facility] hospice liaison was to call pt's family and answer questions. At that time, pt would have been #3 on their waiting list. It's unclear if [facility] put pt on waiting list after speaking with family as family was initially undecided.   CSW called Marchia Meiers with [facility] to clarify; no answer, left voicemail requesting return call.   1615: [facility] and requested [facility]. She is unavailable. Secretary took [employer] and states she will notify Marchia Meiers to call CSW back.    1620: CSW received callback from [facility]. CSW is informed that pt was clinically accepted for [facility] facility and was put on waiting list yesterday afternoon. Pt is currently next on waiting list for hospice bed; no more beds available for pt as of this afternoon. CSW provided his contact info for follow up regarding bed availability. Marchia Meiers states she has already updated pt's son.       Readmission Risk Interventions    03/26/2022    3:15 PM 03/08/2022   11:55 AM 01/29/2022   10:39 AM  Readmission Risk Prevention Plan  Transportation Screening Complete Complete Complete  Medication Review Press photographer) Complete Complete Complete  PCP or Specialist appointment within 3-5 days of discharge Complete    HRI or [place] Complete Complete Complete  SW Recovery Care/Counseling Consult Complete Complete Complete  Palliative Care Screening Complete Not Applicable Not [place] Not Applicable Not Applicable  Not Applicable

## 2022-05-16 NOTE — Progress Notes (Signed)
Cardiology Progress Note  Patient ID: John [name] MRN: [id number] DOB: Aug 12, 1941 Date of Encounter: 05/16/2022  Primary Cardiologist: Kirk Ruths, MD  Subjective   Chief Complaint: SOB  HPI: Volume status improving.  Kidney function improving.  Plan to transition to hospice care as soon as bed is available.  ROS:  All other ROS reviewed and negative. Pertinent positives noted in the HPI.     Inpatient Medications  Scheduled Meds:  carvedilol  3.125 mg Oral BID WC   ferrous sulfate  325 mg Oral BID WC   folic acid  1 mg Oral Daily   gabapentin  300 mg Oral QHS   hydrALAZINE  25 mg Oral Q8H   insulin aspart  0-9 Units Subcutaneous Q4H   insulin aspart  3 Units Subcutaneous TID WC   insulin glargine-yfgn  20 Units Subcutaneous QHS   ipratropium-albuterol  3 mL Nebulization TID   isosorbide mononitrate  30 mg Oral Daily   mometasone-formoterol  2 puff Inhalation BID   pantoprazole  40 mg Oral Q breakfast   rosuvastatin  10 mg Oral QPM   sertraline  25 mg Oral QPM   tamsulosin  0.4 mg Oral Daily   thiamine  100 mg Oral Daily   Continuous Infusions:  furosemide     PRN Meds: acetaminophen **OR** acetaminophen, albuterol, ALPRAZolam   Vital Signs   Vitals:   05/16/22 0008 05/16/22 0356 05/16/22 0749 05/16/22 0855  BP: 128/88 (!) 146/90  (!) 141/68  Pulse: 92 (!) 47    Resp: 20 20    Temp: 97.9 F (36.6 C) (!) 97.5 F (36.4 C)    TempSrc: Oral Oral    SpO2: 96%  95%   Weight: 79.2 kg     Height:        Intake/Output Summary (Last 24 hours) at 05/16/2022 1001 Last data filed at 05/16/2022 0904 Gross per 24 hour  Intake 1374 ml  Output 3625 ml  Net -2251 ml      05/16/2022   12:08 AM 05/15/2022   12:14 AM 05/14/2022    6:00 PM  Last 3 Weights  Weight (lbs) 174 lb 9.7 oz 176 lb 9.4 oz 182 lb 5.1 oz  Weight (kg) 79.2 kg 80.1 kg 82.7 kg      Telemetry  Overnight telemetry shows sinus rhythm with frequent PVCs, which I personally reviewed.   Physical  Exam   Vitals:   05/16/22 0008 05/16/22 0356 05/16/22 0749 05/16/22 0855  BP: 128/88 (!) 146/90  (!) 141/68  Pulse: 92 (!) 47    Resp: 20 20    Temp: 97.9 F (36.6 C) (!) 97.5 F (36.4 C)    TempSrc: Oral Oral    SpO2: 96%  95%   Weight: 79.2 kg     Height:        Intake/Output Summary (Last 24 hours) at 05/16/2022 1001 Last data filed at 05/16/2022 0904 Gross per 24 hour  Intake 1374 ml  Output 3625 ml  Net -2251 ml       05/16/2022   12:08 AM 05/15/2022   12:14 AM 05/14/2022    6:00 PM  Last 3 Weights  Weight (lbs) 174 lb 9.7 oz 176 lb 9.4 oz 182 lb 5.1 oz  Weight (kg) 79.2 kg 80.1 kg 82.7 kg    Body mass index is 27.35 kg/m.  General: Well nourished, well developed, in no acute distress Head: Atraumatic, normal size  Eyes: PEERLA, EOMI  Neck: Supple, JVD 10 to 12  cm of water Endocrine: No thryomegaly Cardiac: Normal S1, S2; RRR; no murmurs, rubs, or gallops Lungs: Diminished breath sounds bilaterally Abd: Soft, nontender, no hepatomegaly  Ext: No edema, pulses 2+ Musculoskeletal: No deformities, BUE and BLE strength normal and equal Skin: Warm and dry, no rashes   Neuro: Alert and oriented to person, place, time, and situation, CNII-XII grossly intact, no focal deficits  Psych: Normal mood and affect   Labs  High Sensitivity Troponin:   Recent Labs  Lab 05/13/22 2053 05/13/22 2300 05/14/22 0217 05/14/22 0412  TROPONINIHS 84* 107* 115* 110*     Cardiac EnzymesNo results for input(s): "TROPONINI" in the last 168 hours. No results for input(s): "TROPIPOC" in the last 168 hours.  Chemistry Recent Labs  Lab 05/14/22 0217 05/14/22 0224 05/14/22 1857 05/15/22 0133 05/16/22 0134  NA 133*   < > 133* 134* 138  K 3.2*   < > 3.7 4.4 3.7  CL 91*   < > 91* 92* 94*  CO2 31  --  31 28 33*  GLUCOSE 264*   < > 290* 211* 157*  BUN 89*   < > 89* 86* 82*  CREATININE 3.17*   < > 3.20* 2.98* 2.79*  CALCIUM 8.9  --  9.1 9.4 9.5  PROT 6.0*  --   --   --   --   ALBUMIN  3.0*  --   --   --   --   AST 13*  --   --   --   --   ALT 9  --   --   --   --   ALKPHOS 45  --   --   --   --   BILITOT 0.9  --   --   --   --   GFRNONAA 19*  --  18* 20* 22*  ANIONGAP 11  --  11 14 11    < > = values in this interval not displayed.    Hematology Recent Labs  Lab 05/14/22 0217 05/14/22 0224 05/14/22 0323 05/15/22 0133 05/16/22 0134  WBC 8.2  --   --  13.5* 11.0*  RBC 2.83*  2.81*  --   --  3.15* 3.16*  HGB 8.0*   < > 8.8* 9.0* 9.3*  HCT 25.9*   < > 26.0* 28.6* 28.7*  MCV 91.5  --   --  90.8 90.8  MCH 28.3  --   --  28.6 29.4  MCHC 30.9  --   --  31.5 32.4  RDW 15.6*  --   --  15.7* 15.7*  PLT 217  --   --  246 264   < > = values in this interval not displayed.   BNP Recent Labs  Lab 05/13/22 2053  BNP 939.0*    DDimer No results for input(s): "DDIMER" in the last 168 hours.   Radiology  DG CHEST PORT 1 VIEW  Result Date: 05/15/2022 CLINICAL DATA:  Shortness of breath EXAM: PORTABLE CHEST 1 VIEW COMPARISON:  05/13/2022 FINDINGS: Cardiomegaly with moderate interstitial edema, progressive. Moderate right pleural effusion, increased. Associated patchy right upper and lower lobe opacities, likely atelectasis. No pneumothorax. Thoracic aortic atherosclerosis. IMPRESSION: Cardiomegaly with moderate interstitial edema and moderate right pleural effusion, progressive. Associated patchy right upper and lower lobe opacities, likely atelectasis. Electronically Signed   By: Julian Hy M.D.   On: 05/15/2022 03:30    Cardiac Studies  TTE 03/08/2022  1. Limited study.   2. Poor acoustic windows.  3. LVEF is severely depressed with hypokinesis worse in the lateral,  anterior and apical walls . Left ventricular ejection fraction, by  estimation, is 25 to 30%. The left ventricle has severely decreased  function. The left ventricular internal cavity  size was moderately dilated. There is mild left ventricular hypertrophy.   4. Right ventricular systolic  function is normal. The right ventricular  size is normal.   5. The inferior vena cava is dilated in size with <50% respiratory  variability, suggesting right atrial pressure of 15 mmHg.   Patient Profile  81 year old male with history of COPD, chronic respiratory failure on 4 L O2, heart failure with reduced ejection fraction, paroxysmal atrial fibrillation, PVCs, CAD (PCI to LAD in 2013; non-STEMI 12/2021 managed medically due to her kidney disease), diabetes, CKD stage IV, PAD, OSA, anemia who was admitted on 05/14/2022 with acute on chronic systolic heart failure.  Assessment & Plan   #Acute on chronic systolic heart failure, EF 25-30% #Ischemic cardiomyopathy, not a candidate for invasive angiography due to CKD stage IV -Admitted with recurrent volume overload.  Improving on Lasix 120 mg IV twice daily.  Not a candidate for aggressive cardiovascular care.  Transitioning to hospice.  Agree with this recommendation. -For now we will continue with IV diuresis 120 mg IV twice daily.  When he pursues hospice can pursue p.o. diuresis for comfort. -Okay to continue carvedilol, Imdur and hydralazine.  Not a candidate for other medical therapy due to CKD stage IV. -All of his heart failure medications can be stopped at discharge.  The focus should be on comfort.  #Paroxysmal A-fib -Can continue beta-blocker for comfort.  No need to continue Eliquis with hospice.  #CAD status post PCI #Recent non-STEMI -No plans for angiography due to CKD stage IV.  All of his heart medications can be stopped once hospice is pursued.  Focus should be on comfort.  [facility] will sign off.   Medication Recommendations: Okay to continue with IV diuresis while here.  Can transition to p.o. home diuretic at discharge.  Focus should be on comfort.  All of his cardiology medications can be stopped if deemed appropriate by the hospice unit. Other recommendations (labs, testing, etc): None. Follow up as an  outpatient: None needed.  For questions or updates, please contact [place] Please consult [email or web address] for contact info under     Signed, [place] Bells T. Audie Box, MD, [place]  05/16/2022 10:01 AM

## 2022-05-16 NOTE — Progress Notes (Addendum)
Daily Progress Note   Patient Name: Roshad Hack       Date: 05/16/2022 DOB: [birth year]  Age: 81 y.o. MRN#: [id number] Attending Physician: Domenic Polite, MD Primary Care Physician: [name], DO Admit Date: 05/13/2022  Reason for Consultation/Follow-up: Establishing goals of care  Subjective: Received notification son/Norm called - requested family meeting at 2:30p.  Chart review performed. Received report from primary RN - no acute concerns. Per TOC, [facility] does not have bed availability today.  2:30 PM Went to visit patient at bedside - son/Norm, daughter/Cathy, granddaughter/Sarah, other family member/Joshua present. Patient was lying in bed awake, alert, oriented, and able to participate in conversation. No signs or non-verbal gestures of pain or discomfort noted. No respiratory distress, increased work of breathing, or secretions noted. Patient denies pain or shortness of breath. He is on 6L HFNC.  Emotional support provided to patient and family. Son/Norm requested to review information as dicussed yesterday. We discussed patient's current illness and what it means in the larger context of patient's on-going co-morbidities. Education provided that CHF is a non-curable, progressive disease for which he has reached end stages. Patient understands that options to manage his CHF are limited.  Natural disease trajectory and expectations at EOL were discussed. I attempted to elicit values and goals of care important to the patient. The difference between aggressive medical intervention and comfort care was considered in light of the patient's goals of care. Patient is clear his goal is for [facility].   We talked about transition to comfort measures in house and what  that would entail inclusive of medications to control pain, dyspnea, agitation, nausea, and itching. We discussed stopping all unnecessary measures such as blood draws, needle sticks, oxygen, antibiotics, CBGs/insulin, cardiac monitoring, IVF, and frequent vital signs. After discussion with family, patient is agreeable for transition to full comfort in house. We discussed in additional detail his BP and blood sugar medications per his request. Patient tells me he has family coming from out of town to see him on Saturday, he is hopeful to still be stable to visit with them at this time. Reviewed patient's blood sugars - patient indicates his legs cramp when his CBG is too high. He would like to continue BP and insulin/CBGs at this time. Will continue while still tolerating POs - he  understands recommendations on these medications will be day by day pending his clinical status and will be stopped on transition to hospice home. Will also continue oxygen with patient understanding oxygen will not be escalated in the event of a decline, but rather symptoms would be managed with comfort medications.  Patient has a small dog, Ginger, whom he loves and would like to see. Reviewed pet visitation guidelines with family - they expressed understanding and were appreciative.   Patient and family express gratitude for Chaplains previous visit and would like Dorian Pod to visit again - will notify her of family's request.  Current [place] EOL visitation policy reviewed - family expressed understanding.  All questions and concerns addressed. Encouraged to call with questions and/or concerns. PMT card provided.  Length of Stay: 2  Current Medications: Scheduled Meds:   carvedilol  3.125 mg Oral BID WC   ferrous sulfate  325 mg Oral BID WC   folic acid  1 mg Oral Daily   gabapentin  300 mg Oral QHS   hydrALAZINE  25 mg Oral Q8H   insulin aspart  0-9 Units Subcutaneous Q4H   insulin aspart  3 Units Subcutaneous TID  WC   insulin glargine-yfgn  20 Units Subcutaneous QHS   ipratropium-albuterol  3 mL Nebulization TID   isosorbide mononitrate  30 mg Oral Daily   mometasone-formoterol  2 puff Inhalation BID   pantoprazole  40 mg Oral Q breakfast   rosuvastatin  10 mg Oral QPM   sertraline  25 mg Oral QPM   tamsulosin  0.4 mg Oral Daily   thiamine  100 mg Oral Daily    Continuous Infusions:  furosemide      PRN Meds: acetaminophen **OR** acetaminophen, albuterol, ALPRAZolam  Physical Exam Vitals and nursing note reviewed.  Constitutional:      General: He is not in acute distress. Pulmonary:     Effort: No respiratory distress.  Skin:    General: Skin is warm and dry.  Neurological:     Mental Status: He is alert and oriented to person, place, and time.     Motor: Weakness present.  Psychiatric:        Attention and Perception: Attention normal.        Behavior: Behavior is cooperative.        Cognition and Memory: Cognition and memory normal.             Vital Signs: BP 111/68   Pulse 94   Temp 98 F (36.7 C)   Resp 20   Ht 5\' 7"  (1.702 m)   Wt 79.2 kg   SpO2 95%   BMI 27.35 kg/m  SpO2: SpO2: 95 % O2 Device: O2 Device: High Flow Nasal Cannula O2 Flow Rate: O2 Flow Rate (L/min): 6 L/min  Intake/output summary:  Intake/Output Summary (Last 24 hours) at 05/16/2022 1444 Last data filed at 05/16/2022 0904 Gross per 24 hour  Intake 892 ml  Output 3150 ml  Net -2258 ml   LBM: Last BM Date : 05/15/22 Baseline Weight: Weight: 82.7 kg Most recent weight: Weight: 79.2 kg       Palliative Assessment/Data: PPS 30%      Patient Active Problem List   Diagnosis Date Noted   Acute on chronic combined systolic (congestive) and diastolic (congestive) heart failure (HCC) 05/14/2022   Elevated troponin 05/14/2022   Iron deficiency anemia 05/14/2022   Prolonged QT interval 05/14/2022   Hyponatremia 05/14/2022   Acute renal failure superimposed on stage  4 chronic kidney disease  ([place]) 05/14/2022   Depression 03/24/2022   Thrombocytopenia ([place]) 03/23/2022   Anemia in CKD (chronic kidney disease) 03/13/2022   Anxiety 03/08/2022   Chronic respiratory failure with hypoxia (HCC) 03/06/2022   Pleural effusion, right    Acute urinary retention 01/30/2022   COPD (chronic obstructive pulmonary disease) ([place]) 01/26/2022   CKD (chronic kidney disease) stage 4, GFR 15-29 ml/min (HCC) 01/26/2022   Paroxysmal atrial fibrillation ([place]) 01/26/2022   Diabetes mellitus, type 2 ([place]) 01/09/2022   D-dimer, elevated 12/24/2021   NSTEMI (non-ST elevated myocardial infarction) ([place]) 12/24/2021   Acute on chronic combined systolic and diastolic CHF (congestive heart failure) ([place]) 09/13/2021   Type 2 diabetes mellitus with hyperlipidemia ([place]) 09/13/2021   OSA (obstructive sleep apnea) 09/13/2021   BPH (benign prostatic hyperplasia) 09/13/2021   Diabetic neuropathy ([place]) 09/13/2021   Chest pain 14/43/1540   Acute diastolic CHF (congestive heart failure) ([place]) 12/19/2020   Essential hypertension 10/03/2012   CAD (coronary artery disease) of artery bypass graft 06/26/2012   Cardiomyopathy, 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   Peripheral vascular disease ([place]) 06/26/2012    Palliative Care Assessment & Plan   Patient Profile: 81 y.o. male  with past medical history of  CAD, chronic systolic heart failure, ICM, paroxysmal atrial fibrillation, hypertension, hyperlipidemia, PVD, CKD stage IV, OSA, CVA, COPD, type 2 diabetes, BPH, GERD, depression, and anxiety  admitted on 05/13/2022 with worsening shortness of breath, dyspnea on exertion.    Patient admitted for acute on chronic combined CHF.  He has had 5 admissions in the past 6 months.  PMT has been consulted to assist with goals of care conversation.  Assessment: Principal Problem:   Acute on chronic combined systolic (congestive) and diastolic (congestive) heart failure (HCC) Active Problems:   CAD (coronary artery disease) of artery bypass  graft   Peripheral vascular disease (HCC)   Essential hypertension   Type 2 diabetes mellitus with hyperlipidemia (HCC)   OSA (obstructive sleep apnea)   BPH (benign prostatic hyperplasia)   COPD (chronic obstructive pulmonary disease) (HCC)   CKD (chronic kidney disease) stage 4, GFR 15-29 ml/min (HCC)   Paroxysmal atrial fibrillation (HCC)   Chronic respiratory failure with hypoxia (HCC)   Depression   Elevated troponin   Iron deficiency anemia   Prolonged QT interval   Hyponatremia   Acute renal failure superimposed on stage 4 chronic kidney disease ([place])   Terminal care  Recommendations/Plan: Initiated full comfort measures Continue DNR/DNI as previously documented Transfer to [facility] when bed available - TOC consult previously placed Added orders for EOL symptom management and to reflect full comfort measures, as well as discontinued orders that were not focused on comfort Chaplain to follow up tomorrow 7/27 Unrestricted visitation orders were placed per current [place] EOL visitation policy  Nursing to provide frequent assessments and administer PRN medications as clinically necessary to ensure EOL comfort PMT will continue to follow and support holistically  Symptom Management Dilaudid PRN pain/dyspnea/increased work of breathing/RR>25/distress Continue coreg, gabapentin, hydralazine, insulin/CBGs, zoloft, flomax and imdur while still tolerating POs  Continue dulera inhaler and albuterol PRN, discontinued duoneb Discontinue lasix drip; start lasix IV BID until discharge Tylenol PRN pain/fever Biotin twice daily Benadryl PRN itching Robinul PRN secretions Haldol PRN agitation/delirium Ativan PRN anxiety/seizure/sleep/distress Zofran PRN nausea/vomiting Liquifilm Tears PRN dry eye    Goals of Care and Additional Recommendations: Limitations on Scope of Treatment: Full Comfort Care  Code Status:    Code Status Orders  (  From admission,  onward)           Start     Ordered   05/14/22 0141  Do not attempt resuscitation (DNR)  Continuous       Question Answer Comment  In the event of cardiac or respiratory ARREST Do not call a "code blue"   In the event of cardiac or respiratory ARREST Do not perform Intubation, CPR, defibrillation or ACLS   In the event of cardiac or respiratory ARREST Use medication by any route, position, wound care, and other measures to relive pain and suffering. May use oxygen, suction and manual treatment of airway obstruction as needed for comfort.      05/14/22 0140           Code Status History     Date Active Date Inactive Code Status Order ID Comments User Context   03/23/2022 1121 03/31/2022 1838 Full Code [id number]  Norval Morton, MD ED   03/06/2022 2219 03/13/2022 2047 Full Code [id number]  Mansy, Arvella Merles, MD Inpatient   03/06/2022 1436 03/06/2022 2219 DNR [id number]  Barton Dubois, MD ED   01/26/2022 0930 02/06/2022 1424 DNR [id number]  Orson Eva, MD ED   01/26/2022 0553 01/26/2022 0930 Full Code [id number]  Bernadette Hoit, DO ED   01/09/2022 0001 01/10/2022 1834 Full Code [id number]  Sadler, [place], DO ED   12/24/2021 0729 12/29/2021 2220 Full Code [id number]  Deatra James, MD ED   09/13/2021 0704 09/15/2021 1818 Full Code [id number]  Bernadette Hoit, DO ED   12/19/2020 1611 12/20/2020 1902 Full Code [id number]  Lequita Halt, MD ED       Prognosis:  < 2 weeks  Discharge Planning: Hospice facility  Care plan was discussed with primary RN, patient, patient's family, Dr. Broadus John, [facility]  Thank you for allowing the Palliative Medicine Team to assist in the care of this patient.  Lin Landsman, NP  Please contact Palliative Medicine Team phone at [phone] for questions and concerns.   *Portions of this note are a verbal dictation therefore any spelling and/or grammatical errors are due to the "[place] [employer]" system interpretation.

## 2022-05-16 NOTE — Progress Notes (Signed)
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 -Recurrent acute on chronic systolic CHF and AKI on CKD 4 -Cards following, on diuretics, palliative care consulted, discussions on hospice ongoing  Subjective: -Feels okay, breathing continuing to improve, continues to have hematuria, did not have retention last night  Assessment and Plan:  Acute on chronic systolic CHF -Last echo 9/50 with EF of 25-30% with wall motion abnormality, ischemic work-up not pursued on account of advanced CKD on torsemide 60 Mg at baseline with weekly metolazone, patient reports compliance with diuretics, daughter cooks his meals, follows low-salt diet -Diuresed with 120 Mg of IV Lasix BID, he is 4.6 L negative -Cards following, restarting hydralazine, Imdur, GDMT limited by AKI/CKD -Seen by CHF TOC team multiple times in the last 1 to 2 months -Overall prognosis appears to be poor with worsening cardiomyopathy, progressive kidney disease, frequent hospitalizations, palliative medicine consulted for goals of care, plan for hospice of [place], palliative team following   History of CAD NSTEMI 3/23 treated medically -Continue  beta-blocker and statin -Holding Plavix and Eliquis for hematuria   AKI on CKD 4 -Likely cardiorenal, renal ultrasound without hydronephrosis -Avoid hypotension,, monitor urine output, BMP -I do not think he would be a good dialysis candidate -Palliative consulted for goals of care, see discussion above   COPD/chronic respiratory failure -On 4 L home O2 at baseline -Continue  DuoNebs, Dulera   Iron deficiency anemia -Chronic, stable, monitor -Given IV iron, anemia panel with severe deficiency   Type 2 diabetes mellitus -Continue glargine   OSA -Continue CPAP   BPH -Continue Flomax   DVT prophylaxis: Eliquis on hold  Code Status: DNR Family Communication: Discussed patient detail, no family at bedside Disposition Plan: [facility] hospice  Consultants:  Cardiology, palliative care  Procedures:   Antimicrobials:    Objective: Vitals:   05/16/22 0356 05/16/22 0749 05/16/22 0855 05/16/22 1055  BP: (!) 146/90  (!) 141/68 111/68  Pulse: (!) 47   94  Resp: 20   20  Temp: (!) 97.5 F (36.4 C)   98 F (36.7 C)  TempSrc: Oral     SpO2:  95%  95%  Weight:      Height:        Intake/Output Summary (Last 24 hours) at 05/16/2022 1144 Last data filed at 05/16/2022 9326 Gross per 24 hour  Intake 1072 ml  Output 3150 ml  Net -2078 ml   Filed Weights   05/14/22 1800 05/15/22 0014 05/16/22 0008  Weight: 82.7 kg 80.1 kg 79.2 kg    Examination:  General exam: Chronically ill male sitting up in bed, AAOx3, no distress HEENT: Positive JVD CVS: S1-S2, regular rhythm Lungs: Rare basilar rales Abdomen: Soft, nontender, bowel sounds present Extremities: No edema Skin: No rashes Psychiatry:  Mood & affect appropriate.     Data Reviewed:   CBC: Recent Labs  Lab 05/13/22 2053 05/14/22 0217 05/14/22 0224 05/14/22 0323 05/15/22 0133 05/16/22 0134  WBC 9.9 8.2  --   --  13.5* 11.0*  NEUTROABS 8.6*  --   --   --   --   --  HGB 8.6* 8.0* 8.8* 8.8* 9.0* 9.3*  HCT 27.8* 25.9* 26.0* 26.0* 28.6* 28.7*  MCV 92.1 91.5  --   --  90.8 90.8  PLT 220 217  --   --  246 086   Basic Metabolic Panel: Recent Labs  Lab 05/13/22 2053 05/14/22 0217 05/14/22 0224 05/14/22 0323 05/14/22 1857 05/15/22 0133 05/16/22 0134  NA 132* 133* 131* 131* 133* 134* 138  K 3.7 3.2* 3.2* 3.2* 3.7 4.4 3.7  CL 90* 91* 89*  --  91* 92* 94*  CO2 29 31  --   --  31  28 33*  GLUCOSE 319* 264* 267*  --  290* 211* 157*  BUN 87* 89* 96*  --  89* 86* 82*  CREATININE 3.23* 3.17* 3.20*  --  3.20* 2.98* 2.79*  CALCIUM 9.2 8.9  --   --  9.1 9.4 9.5  MG  --  2.2  --   --   --   --   --   PHOS  --  4.5  --   --   --   --   --    GFR: Estimated Creatinine Clearance: 18.8 mL/min (A) (by C-G formula based on SCr of 2.79 mg/dL (H)). Liver Function Tests: Recent Labs  Lab 05/14/22 0217  AST 13*  ALT 9  ALKPHOS 45  BILITOT 0.9  PROT 6.0*  ALBUMIN 3.0*   No results for input(s): "LIPASE", "AMYLASE" in the last 168 hours. No results for input(s): "AMMONIA" in the last 168 hours. Coagulation Profile: No results for input(s): "INR", "PROTIME" in the last 168 hours. Cardiac Enzymes: Recent Labs  Lab 05/14/22 0217  CKTOTAL 110   BNP (last 3 results) No results for input(s): "PROBNP" in the last 8760 hours. HbA1C: Recent Labs    05/14/22 0217  HGBA1C 7.2*   CBG: Recent Labs  Lab 05/15/22 2007 05/16/22 0006 05/16/22 0354 05/16/22 0812 05/16/22 1052  GLUCAP 231* 158* 154* 187* 361*   Lipid Profile: No results for input(s): "CHOL", "HDL", "LDLCALC", "TRIG", "CHOLHDL", "LDLDIRECT" in the last 72 hours. Thyroid Function Tests: Recent Labs    05/14/22 0217  TSH 1.956   Anemia Panel: Recent Labs    05/14/22 0217  VITAMINB12 775  FOLATE >40.0  FERRITIN 77  TIBC 413  IRON 27*  RETICCTPCT 3.6*   Urine analysis:    Component Value Date/Time   COLORURINE YELLOW 05/14/2022 0505   APPEARANCEUR CLEAR 05/14/2022 0505   LABSPEC 1.010 05/14/2022 0505   PHURINE 5.0 05/14/2022 0505   GLUCOSEU NEGATIVE 05/14/2022 0505   HGBUR SMALL (A) 05/14/2022 0505   BILIRUBINUR NEGATIVE 05/14/2022 0505   KETONESUR NEGATIVE 05/14/2022 0505   PROTEINUR NEGATIVE 05/14/2022 0505   NITRITE NEGATIVE 05/14/2022 0505   LEUKOCYTESUR NEGATIVE 05/14/2022 0505   Sepsis Labs: @LABRCNTIP (procalcitonin:4,lacticidven:4)  )No results found for this or any previous  visit (from the past 240 hour(s)).   Radiology Studies: DG CHEST PORT 1 VIEW  Result Date: 05/15/2022 CLINICAL DATA:  Shortness of breath EXAM: PORTABLE CHEST 1 VIEW COMPARISON:  05/13/2022 FINDINGS: Cardiomegaly with moderate interstitial edema, progressive. Moderate right pleural effusion, increased. Associated patchy right upper and lower lobe opacities, likely atelectasis. No pneumothorax. Thoracic aortic atherosclerosis. IMPRESSION: Cardiomegaly with moderate interstitial edema and moderate right pleural effusion, progressive. Associated patchy right upper and lower lobe opacities, likely atelectasis. Electronically Signed   By: Julian Hy M.D.   On: 05/15/2022 03:30     Scheduled Meds:  carvedilol  3.125 mg Oral BID WC  ferrous sulfate  325 mg Oral BID WC   folic acid  1 mg Oral Daily   gabapentin  300 mg Oral QHS   hydrALAZINE  25 mg Oral Q8H   insulin aspart  0-9 Units Subcutaneous Q4H   insulin aspart  3 Units Subcutaneous TID WC   insulin glargine-yfgn  20 Units Subcutaneous QHS   ipratropium-albuterol  3 mL Nebulization TID   isosorbide mononitrate  30 mg Oral Daily   mometasone-formoterol  2 puff Inhalation BID   pantoprazole  40 mg Oral Q breakfast   rosuvastatin  10 mg Oral QPM   sertraline  25 mg Oral QPM   tamsulosin  0.4 mg Oral Daily   thiamine  100 mg Oral Daily   Continuous Infusions:  furosemide       LOS: 2 days    Time spent: 8min    Domenic Polite, MD [facility]   05/16/2022, 11:44 AM

## 2022-05-16 NOTE — Progress Notes (Signed)
Mobility Specialist Progress Note:   05/16/22 1037  Mobility  Activity Ambulated with assistance in hallway  Level of Assistance Contact guard assist, steadying assist  Assistive Device Front wheel walker  Distance Ambulated (ft) 50 ft  Activity Response Tolerated well  $Mobility charge 1 Mobility   Pt received in bed willing to participate in mobility. Complaints of general weakness. Left in chair with call bell in reach and all needs met.   [facility] Caitlen Worth Mobility Specialist

## 2022-05-16 NOTE — Progress Notes (Addendum)
No show  hospitalized

## 2022-05-17 DIAGNOSIS — I5043 Acute on chronic combined systolic (congestive) and diastolic (congestive) heart failure: Secondary | ICD-10-CM | POA: Diagnosis not present

## 2022-05-17 LAB — GLUCOSE, CAPILLARY
Glucose-Capillary: 167 mg/dL — ABNORMAL HIGH (ref 70–99)
Glucose-Capillary: 181 mg/dL — ABNORMAL HIGH (ref 70–99)
Glucose-Capillary: 199 mg/dL — ABNORMAL HIGH (ref 70–99)
Glucose-Capillary: 361 mg/dL — ABNORMAL HIGH (ref 70–99)

## 2022-05-17 NOTE — TOC Progression Note (Signed)
Transition of Care [facility]) - Progression Note    Patient Details  Name: John [name] MRN: [id number] Date of Birth: December 05, 1940  Transition of Care [facility]) CM/SW [place], [place] Phone Number: 05/17/2022, 11:07 AM  Clinical Narrative:     CSW called Marchia Meiers with [facility]; no beds available currently; patient is still next on list.   Expected Discharge Plan: [place] Barriers to Discharge: Hospice Bed not available  Expected Discharge Plan and Services Expected Discharge Plan: [place]                                               Social Determinants of Health (SDOH) Interventions    Readmission Risk Interventions    03/26/2022    3:15 PM 03/08/2022   11:55 AM 01/29/2022   10:39 AM  Readmission Risk Prevention Plan  Transportation Screening Complete Complete Complete  Medication Review ([place]) Complete Complete Complete  PCP or Specialist appointment within 3-5 days of discharge Complete    HRI or [place] Complete Complete Complete  SW Recovery Care/Counseling Consult Complete Complete Complete  Palliative Care Screening Complete Not Applicable Not [place] Not Applicable Not Applicable Not Applicable

## 2022-05-17 NOTE — Progress Notes (Signed)
Mobility Specialist Progress Note:   05/17/22 1002  Mobility  Activity Ambulated with assistance in room;Ambulated with assistance to bathroom  Level of Assistance Standby assist, set-up cues, supervision of patient - no hands on  Assistive Device Front wheel walker  Distance Ambulated (ft) 50 ft  Activity Response Tolerated well  $Mobility charge 1 Mobility   Pt received in bed asking to go to the bathroom. No complaints of pain. Left in bed with call bell in reach and all needs met.   [facility] Shawntelle Ungar Mobility Specialist

## 2022-05-17 NOTE — Addendum Note (Signed)
Addended by: Lorenda Peck R on: 05/17/2022 09:33 AM   Modules accepted: Level of Service

## 2022-05-17 NOTE — Progress Notes (Signed)
Patient John [name]      DOB: 11/27/1940      YSA:[id number]      Palliative Medicine Team    Subjective: Bedside symptom check completed. No family or visitors present at time of visit.    Physical exam: Patient resting in bed with eyes closed at time of visit. Breathing even and non-labored with nasal cannula salter applied, no excessive secretions noted. Patient without physical or non-verbal signs of pain or discomfort at this time. This RN did not attempt to wake, to promote comfort.    Assessment and plan: This RN touched base with bedside RN, Elisa. She has no concerns or needs today. Plan to transfer to residential hospice once bed is offered, patient remains stable for transport this morning. Will continue to follow for any changes or advances.    Thank you for allowing the Palliative Medicine Team to assist in the care of this patient.     Damian Leavell, MSN, RN Palliative Medicine Team Team Phone: [phone]  This phone is monitored 7a-7p, please reach out to attending physician outside of these hours for urgent needs.

## 2022-05-17 NOTE — Progress Notes (Signed)
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 -Recurrent acute on chronic systolic CHF and AKI on CKD 4 -Cards consulted, recommended palliative care eval,  -Plan for residential hospice  Subjective: -Feels okay, breathing continuing to improve, continues to have hematuria, did not have retention last night  Assessment and Plan:  Acute on chronic systolic CHF -Last echo 6/27 with EF of 25-30% with wall motion abnormality, ischemic work-up not pursued on account of advanced CKD on torsemide 60 Mg at baseline with weekly metolazone -Fifth admission in 6 months with CHF -Diuresed with 120 Mg of IV Lasix BID, he is 4.6 L negative -Cards consulted, recommended palliative care, restarted hydralazine/Imdur, GDMT limited by AKI/CKD -Overall prognosis appears to be poor with worsening cardiomyopathy, progressive kidney disease, frequent hospitalizations, palliative medicine consulted for goals of care, plan for discharge to [place] when bed available   History of CAD NSTEMI 3/23 treated medically -Continue  beta-blocker and statin -Holding Plavix and Eliquis for hematuria   AKI on CKD 4 -Likely cardiorenal, renal ultrasound without hydronephrosis -Avoid hypotension,, monitor urine output, BMP -I do not think he would be a good dialysis candidate -Palliative consulted for goals of care, see discussion above   COPD/chronic respiratory failure -On 4 L home O2 at baseline -Continue DuoNebs, Dulera   Iron deficiency anemia -Given IV iron, anemia panel with severe deficiency    Type 2 diabetes mellitus -Comfort measures, DC insulin   OSA -Continue CPAP   BPH Urinary retention -Continue Flomax   DVT prophylaxis: Eliquis on hold  Code Status: DNR Family Communication: Discussed patient detail, no family at bedside Disposition Plan: [facility] hosp[facility] when bed available  Consultants:  Cardiology, palliative care  Procedures:   Antimicrobials:    Objective: Vitals:   05/16/22 1055 05/16/22 1326 05/16/22 2021 05/16/22 2022  BP: 111/68   133/60  Pulse: 94  84 91  Resp: 20  18 18   Temp: 98 F (36.7 C)   97.7 F (36.5 C)  TempSrc:    Oral  SpO2: 95% 95% 94% 95%  Weight:      Height:        Intake/Output Summary (Last 24 hours) at 05/17/2022 1048 Last data filed at 05/17/2022 0350 Gross per 24 hour  Intake 1280 ml  Output 1200 ml  Net 80 ml   Filed Weights   05/14/22 1800 05/15/22 0014 05/16/22 0008  Weight: 82.7 kg 80.1 kg 79.2 kg    Examination:  General exam: Chronically ill male, : Awake, Alert, Oriented X 3,  HEENT: +JVD CVs: S1-S2, regular rhythm Lungs: Fine bilateral rales Abdominal: Soft, nontender, bowel sounds present Extremities: No edema CVS: S1S2/RRR Skin: No rashes on exposed skin Psychiatry:  Mood & affect appropriate.     Data Reviewed:   CBC: Recent Labs  Lab 05/13/22 2053 05/14/22 0217 05/14/22 0224 05/14/22 0323 05/15/22 0133 05/16/22 0134  WBC 9.9 8.2  --   --  13.5* 11.0*  NEUTROABS 8.6*  --   --   --   --   --   HGB 8.6* 8.0* 8.8* 8.8* 9.0* 9.3*  HCT 27.8* 25.9* 26.0*  26.0* 28.6* 28.7*  MCV 92.1 91.5  --   --  90.8 90.8  PLT 220 217  --   --  246 209   Basic Metabolic Panel: Recent Labs  Lab 05/13/22 2053 05/14/22 0217 05/14/22 0224 05/14/22 0323 05/14/22 1857 05/15/22 0133 05/16/22 0134  NA 132* 133* 131* 131* 133* 134* 138  K 3.7 3.2* 3.2* 3.2* 3.7 4.4 3.7  CL 90* 91* 89*  --  91* 92* 94*  CO2 29 31  --   --  31 28 33*  GLUCOSE 319* 264* 267*  --  290* 211* 157*  BUN 87* 89* 96*   --  89* 86* 82*  CREATININE 3.23* 3.17* 3.20*  --  3.20* 2.98* 2.79*  CALCIUM 9.2 8.9  --   --  9.1 9.4 9.5  MG  --  2.2  --   --   --   --   --   PHOS  --  4.5  --   --   --   --   --    GFR: Estimated Creatinine Clearance: 18.8 mL/min (A) (by C-G formula based on SCr of 2.79 mg/dL (H)). Liver Function Tests: Recent Labs  Lab 05/14/22 0217  AST 13*  ALT 9  ALKPHOS 45  BILITOT 0.9  PROT 6.0*  ALBUMIN 3.0*   No results for input(s): "LIPASE", "AMYLASE" in the last 168 hours. No results for input(s): "AMMONIA" in the last 168 hours. Coagulation Profile: No results for input(s): "INR", "PROTIME" in the last 168 hours. Cardiac Enzymes: Recent Labs  Lab 05/14/22 0217  CKTOTAL 110   BNP (last 3 results) No results for input(s): "PROBNP" in the last 8760 hours. HbA1C: No results for input(s): "HGBA1C" in the last 72 hours.  CBG: Recent Labs  Lab 05/16/22 1610 05/16/22 2020 05/17/22 0009 05/17/22 0534 05/17/22 0733  GLUCAP 111* 238* 199* 181* 167*   Lipid Profile: No results for input(s): "CHOL", "HDL", "LDLCALC", "TRIG", "CHOLHDL", "LDLDIRECT" in the last 72 hours. Thyroid Function Tests: No results for input(s): "TSH", "T4TOTAL", "FREET4", "T3FREE", "THYROIDAB" in the last 72 hours.  Anemia Panel: No results for input(s): "VITAMINB12", "FOLATE", "FERRITIN", "TIBC", "IRON", "RETICCTPCT" in the last 72 hours.  Urine analysis:    Component Value Date/Time   COLORURINE YELLOW 05/14/2022 0505   APPEARANCEUR CLEAR 05/14/2022 0505   LABSPEC 1.010 05/14/2022 0505   PHURINE 5.0 05/14/2022 0505   GLUCOSEU NEGATIVE 05/14/2022 0505   HGBUR SMALL (A) 05/14/2022 0505   BILIRUBINUR NEGATIVE 05/14/2022 0505   KETONESUR NEGATIVE 05/14/2022 0505   PROTEINUR NEGATIVE 05/14/2022 0505   NITRITE NEGATIVE 05/14/2022 0505   LEUKOCYTESUR NEGATIVE 05/14/2022 0505   Sepsis Labs: @LABRCNTIP (procalcitonin:4,lacticidven:4)  )No results found for this or any previous visit (from the  past 240 hour(s)).   Radiology Studies: No results found.   Scheduled Meds:  antiseptic oral rinse  15 mL Topical BID   carvedilol  3.125 mg Oral BID WC   furosemide  80 mg Intravenous BID   gabapentin  300 mg Oral QHS   hydrALAZINE  25 mg Oral Q8H   insulin glargine-yfgn  20 Units Subcutaneous QHS   isosorbide mononitrate  30 mg Oral Daily   mometasone-formoterol  2 puff Inhalation BID   sertraline  25 mg Oral QPM   tamsulosin  0.4 mg Oral Daily   Continuous Infusions:     LOS: 3 days    Time spent: 43min    Domenic Polite, MD [facility]   05/17/2022, 10:48 AM

## 2022-05-17 NOTE — Progress Notes (Signed)
This chaplain responded to family's request for ongoing spiritual care. The Pt. is awake and appreciative of the visit.   The Pt. shared he didn't sleep well last night "maybe I am a little anxious about what is to come." The chaplain listened reflectively as the Pt. found peace in  recognizing death is unknown to everyone, but he has God's love to journey with him.  The Pt. also finds peace in talking about his family, children, grandchildren and great grand children.  The chaplain ended the visit with Pt. requested prayer.  Chaplain Sallyanne Kuster [phone]

## 2022-05-18 ENCOUNTER — Ambulatory Visit: Payer: Medicare Other | Admitting: Pulmonary Disease

## 2022-05-18 DIAGNOSIS — I5023 Acute on chronic systolic (congestive) heart failure: Secondary | ICD-10-CM | POA: Diagnosis not present

## 2022-05-18 DIAGNOSIS — I25810 Atherosclerosis of coronary artery bypass graft(s) without angina pectoris: Secondary | ICD-10-CM | POA: Diagnosis not present

## 2022-05-18 DIAGNOSIS — N179 Acute kidney failure, unspecified: Secondary | ICD-10-CM | POA: Diagnosis not present

## 2022-05-18 DIAGNOSIS — I480 Paroxysmal atrial fibrillation: Secondary | ICD-10-CM | POA: Diagnosis not present

## 2022-05-18 MED ORDER — ORAL CARE MOUTH RINSE
15.0000 mL | OROMUCOSAL | Status: DC | PRN
Start: 2022-05-18 — End: 2022-05-21

## 2022-05-18 MED ORDER — TORSEMIDE 20 MG PO TABS
40.0000 mg | ORAL_TABLET | Freq: Two times a day (BID) | ORAL | Status: DC
  Administered 2022-05-19 – 2022-05-20 (×3): 40 mg via ORAL
  Filled 2022-05-18 (×3): qty 2

## 2022-05-18 NOTE — Care Management Important Message (Signed)
Important Message  Patient Details  Name: John [name] MRN: [id number] Date of Birth: 05/05/1941   Medicare Important Message Given:  Yes     [name] 05/18/2022, 8:42 AM

## 2022-05-18 NOTE — Assessment & Plan Note (Signed)
CKD stage 4.  Cardiorenal syndrome.  Not candidate for renal replacement therapy in the setting of advance heart failure.   No further blood work.

## 2022-05-18 NOTE — Progress Notes (Signed)
Patient BT:VMTNZD Overbeck      DOB: [birth year]      KEU:[id number]      Palliative Medicine Team    Subjective: Bedside symptom check completed. No family or visitors present at time of visit.   Physical exam: Patient resting in bed with eyes closed at time of visit. Breathing even and non-labored with nasal cannula applied, no excessive secretions noted. Patient without physical or non-verbal signs of pain or discomfort at this time. Patient pleasantly converational, shares he is comfortable at this time, his breathing is improved from previous days, and he is ready for the next stage of his life, at [facility]. He did endorse pain in his left shoulder from how he slept, but felt it was improving after medication administration (see eMAR).   Assessment and plan: This RN checked in with bedside RN, without needs or concerns this morning. Will continue to follow for any changes or advances. Will watch for social work update regarding bed status at residential hospice facility, he remains stable for bed when offered.     Thank you for allowing the Palliative Medicine Team to assist in the care of this patient.     Damian Leavell, MSN, RN Palliative Medicine Team Team Phone: [phone]  This phone is monitored 7a-7p, please reach out to attending physician outside of these hours for urgent needs.

## 2022-05-18 NOTE — Assessment & Plan Note (Signed)
Patient not candidate for anticoagulation due to hematuria. Patient was placed on low dose carvedilol that will be discontinued at the time of his discharge.

## 2022-05-18 NOTE — Assessment & Plan Note (Signed)
Capillary glucose has been 199 to 361 On comfort measures, insulin therapy has been discontinued.

## 2022-05-18 NOTE — Hospital Course (Signed)
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 -Recurrent acute on chronic systolic CHF and AKI on CKD 4 -Cards consulted, recommended palliative care eval,  -Plan for residential hospice

## 2022-05-18 NOTE — TOC Progression Note (Addendum)
Transition of Care [facility]) - Progression Note    Patient Details  Name: John [name] MRN: [id number] Date of Birth: 1940-12-15  Transition of Care [facility]) CM/SW [place], [place] Phone Number: 05/18/2022, 9:59 AM  Clinical Narrative:     [place]; Network engineer informed CSW that [facility] in admissions is out today but that [facility] can give CSW a return call regarding bed status. CSW provided contact info for return call.   1029: CSW received call back message from [facility] notifying that they still do not have an available bed. They will call CSW once a bed is available.    Expected Discharge Plan: [place] Barriers to Discharge: Hospice Bed not available  Expected Discharge Plan and Services Expected Discharge Plan: [place]                                               Social Determinants of Health (SDOH) Interventions    Readmission Risk Interventions    03/26/2022    3:15 PM 03/08/2022   11:55 AM 01/29/2022   10:39 AM  Readmission Risk Prevention Plan  Transportation Screening Complete Complete Complete  Medication Review [employer]) Complete Complete Complete  PCP or Specialist appointment within 3-5 days of discharge Complete    HRI or [place] Complete Complete Complete  SW Recovery Care/Counseling Consult Complete Complete Complete  Palliative Care Screening Complete Not Applicable Not [place] Not Applicable Not Applicable Not Applicable

## 2022-05-18 NOTE — Assessment & Plan Note (Signed)
Continue with b blocker and statin No antiplatelet therapy due to hematuria.

## 2022-05-18 NOTE — Assessment & Plan Note (Signed)
Sp IV iron infusion.

## 2022-05-18 NOTE — Assessment & Plan Note (Signed)
Urinary retention, patient has a foley cathter in place. Continue with flomax.

## 2022-05-18 NOTE — Assessment & Plan Note (Signed)
No clinical signs of exacerbation.  Continue supplemental 02 per [place]

## 2022-05-18 NOTE — Assessment & Plan Note (Addendum)
B blocker has been discontinued. Patient under palliative/ comfort care.

## 2022-05-18 NOTE — Consult Note (Signed)
   [facility] Inpatient Consult   05/18/2022  John [name] [birth year] [id number]  [facility] [ACO] Patient: Medicare ACO REACH  [facility] Status: Active with [facility] NP-G  Met briefly with patient at the bedside, patient with eyes close but awakens to my voice and states, "I was not quite asleep just drifting off to sleep."  Explained reason for visit and he appreciates the visit and looks forward to speaking with Kayleen Memos.  Alert and oriented x 4 and remember this writer's name.  Discussion in progression meeting for ongoing comfort measure with hospice for post hospital transition.  [name], RN BSN [facility] Liaison  [phone] business mobile phone Toll free office [phone]  Fax number: [phone] [email or web address]

## 2022-05-18 NOTE — Progress Notes (Signed)
  Progress Note   Patient: John [name] KCL:[id number] DOB: 15-Aug-1941 DOA: 05/13/2022     4 DOS: the patient was seen and examined on 05/18/2022   Brief hospital course: Mr. 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 -Recurrent acute on chronic systolic CHF and AKI on CKD 4 -Cards consulted, recommended palliative care eval,  -Plan for residential hospice  Assessment and Plan: * Acute on chronic systolic CHF (congestive heart failure) (HCC) Echocardiogram with reduced LV systolic function EF 25 to 30%, Recurrent hospitalizations due to heart exacerbations #6  Patient and his family have decided to transition to residential hospice.  Continue diuresis for palliative.   Acute on chronic hypoxemic respiratory failure, continue with palliative supplemental 02 per [place]   Acute kidney injury superimposed on chronic kidney disease (HCC) CKD stage 4.  Cardiorenal syndrome.  Not candidate for renal replacement therapy in the setting of advance heart failure.   No further blood work.   Paroxysmal atrial fibrillation (HCC) Continue with metoprolol for rate control.  No anticoagulation due to hematuria.   CAD (coronary artery disease) of artery bypass graft Continue with b blocker and statin No antiplatelet therapy due to hematuria.   COPD (chronic obstructive pulmonary disease) (HCC) No clinical signs of exacerbation.  Continue supplemental 02 per    Essential hypertension Continue with metoprolol.   Type 2 diabetes mellitus with hyperlipidemia (HCC) Capillary glucose has been 199 to 361 On comfort measures, insulin therapy has been discontinued.   BPH (benign prostatic  hyperplasia) Urinary retention Continue with flomax.   Iron deficiency anemia Sp IV iron infusion.       {Tip this will not be part of the note when signed Body mass index is 27.35 kg/m. , ,  (Optional):26781}  Subjective: patient is feeling well, mild neck pain, no dyspnea or chest pain   Physical Exam: Vitals:   05/17/22 2133 05/18/22 0638 05/18/22 0729 05/18/22 0747  BP: 131/76 (!) 151/69  (!) 143/77  Pulse:   99 96  Resp: 18   20  Temp:    98.1 F (36.7 C)  TempSrc:    Oral  SpO2: 100%   98%  Weight:      Height:       Neurology awake and alert ENT with no pallor Cardiovascular with S1 and S2 present with no gallop, positive systolic murmur at the apex Respiratory with no rales or wheezing Abdomen not distended No lower extremity edema  Data Reviewed: {Tip this will not be part of the note when signed- Document your independent interpretation of telemetry tracing, EKG, lab, Radiology test or any other diagnostic tests. Add any new diagnostic test ordered today. (Optional):26781}   Family Communication: no family at the bedside   Disposition: Status is: Inpatient Remains inpatient appropriate because: pending transfer to residential hospice   Planned Discharge Destination:  residential hospice  {Tip this will not be part of the note when signed  DVT Prophylaxis  .,  (Optional):26781}   Author: Tawni Millers, MD 05/18/2022 5:00 PM  For on call review [email or web address].

## 2022-05-18 NOTE — Assessment & Plan Note (Addendum)
Echocardiogram with reduced LV systolic function EF 25 to 30%, Recurrent hospitalizations due to heart exacerbations #6  Patient and his family have decided to transition to residential hospice.  Patient received palliative diuresis during his hospitalization.  Continue with oral torsemide bid 80 mg as tolerated.  Acute on chronic hypoxemic respiratory failure, continue with palliative supplemental 02 per [place]

## 2022-05-19 DIAGNOSIS — I25810 Atherosclerosis of coronary artery bypass graft(s) without angina pectoris: Secondary | ICD-10-CM | POA: Diagnosis not present

## 2022-05-19 DIAGNOSIS — I5023 Acute on chronic systolic (congestive) heart failure: Secondary | ICD-10-CM | POA: Diagnosis not present

## 2022-05-19 DIAGNOSIS — N179 Acute kidney failure, unspecified: Secondary | ICD-10-CM | POA: Diagnosis not present

## 2022-05-19 DIAGNOSIS — I480 Paroxysmal atrial fibrillation: Secondary | ICD-10-CM | POA: Diagnosis not present

## 2022-05-19 MED ORDER — BISACODYL 5 MG PO TBEC
5.0000 mg | DELAYED_RELEASE_TABLET | Freq: Once | ORAL | Status: AC
Start: 2022-05-19 — End: 2022-05-19
  Administered 2022-05-19: 5 mg via ORAL
  Filled 2022-05-19: qty 1

## 2022-05-19 NOTE — Progress Notes (Signed)
Daily Progress Note   Patient Name: John [name]       Date: 05/19/2022 DOB: 1941-06-13  Age: 81 y.o. MRN#: [id number] Attending Physician: Tawni Millers Primary Care Physician: [name], DO Admit Date: 05/13/2022  Reason for Consultation/Follow-up: Establishing goals of care and Terminal Care  Subjective: Medical records reviewed including progress notes. Patient assessed at the bedside. He is in no acute distress, watching TV comfortably.  Discussed patient's current medication regimen to ensure his comfort.  He reports this remains effective and he has no concerns about his symptoms.  He understands that there will likely not be a bed available at [facility] this weekend.  He reflects that it is unfortunate that someone will have to go in order for him to be transferred.  He is very appreciative of palliative team support.  Questions and concerns addressed. PMT will continue to support holistically.   Length of Stay: 5   Physical Exam Vitals and nursing note reviewed.  Constitutional:      General: He is not in acute distress. Cardiovascular:     Rate and Rhythm: Normal rate.  Pulmonary:     Effort: Pulmonary effort is normal.  Skin:    General: Skin is warm and dry.  Neurological:     Mental Status: He is alert. Mental status is at baseline.  Psychiatric:        Mood and Affect: Mood normal.             Vital Signs: BP (!) 128/57 (BP Location: Left Arm)   Pulse 99   Temp 97.8 F (36.6 C) (Oral)   Resp 18   Ht 5\' 7"  (1.702 m)   Wt 81.2 kg   SpO2 97%   BMI 28.04 kg/m  SpO2: SpO2: 97 % O2 Device: O2 Device: Nasal Cannula O2 Flow Rate: O2 Flow Rate (L/min): 6 L/min      Palliative Assessment/Data:     Palliative Care Assessment & Plan    Patient Profile: 81 y.o. male  with past medical history of  CAD, chronic systolic heart failure, ICM, paroxysmal atrial fibrillation, hypertension, hyperlipidemia, PVD, CKD stage IV, OSA, CVA, COPD, type 2 diabetes, BPH, GERD, depression, and anxiety  admitted on 05/13/2022 with worsening shortness of breath, dyspnea on exertion.    Patient admitted for acute  on chronic combined CHF.  He has had 5 admissions in the past 6 months.  PMT has been consulted to assist with goals of care conversation.    Assessment: End of life care  Recommendations/Plan: Continue comfort care measures per MAR, no adjustments required today Psychosocial and emotional support provided Patient remains stable for transfer to [facility] facility when a bed is available PMT will continue to follow and support   Prognosis:  < 2 weeks  Discharge Planning: [place] was discussed with Patient   Total time: I spent 25 minutes in the care of the patient today in the above activities and documenting the encounter.          Ignacio Lowder Johnnette Litter, PA-C  Palliative Medicine Team Team phone # [phone]  Thank you for allowing the Palliative Medicine Team to assist in the care of this patient. Please utilize secure chat with additional questions, if there is no response within 30 minutes please call the above phone number.  Palliative Medicine Team providers are available by phone from 7am to 7pm daily and can be reached through the team cell phone.  Should this patient require assistance outside of these hours, please call the patient's attending physician.

## 2022-05-19 NOTE — Progress Notes (Signed)
  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 -Recurrent acute on chronic systolic CHF and AKI on CKD 4 -Cards consulted, recommended palliative care eval,  -Plan for residential hospice  Assessment and Plan: * Acute on chronic systolic CHF (congestive heart failure) (HCC) Echocardiogram with reduced LV systolic function EF 25 to 30%, Recurrent hospitalizations due to heart exacerbations #6  Patient and his family have decided to transition to residential hospice.  Continue diuresis for palliative.   Acute on chronic hypoxemic respiratory failure, continue with palliative supplemental 02 per [place]   Acute kidney injury superimposed on chronic kidney disease (HCC) CKD stage 4.  Cardiorenal syndrome.  Not candidate for renal replacement therapy in the setting of advance heart failure.   No further blood work.   Paroxysmal atrial fibrillation (HCC) Continue with metoprolol for rate control.  No anticoagulation due to hematuria.   CAD (coronary artery disease) of artery bypass graft Continue with b blocker and statin No antiplatelet therapy due to hematuria.   COPD (chronic obstructive pulmonary disease) (HCC) No clinical signs of exacerbation.  Continue supplemental 02 per [place]   Essential hypertension Continue with metoprolol.   Type 2 diabetes mellitus with hyperlipidemia (HCC) Capillary glucose has been 199 to 361 On comfort measures, insulin therapy has been discontinued.   BPH (benign prostatic  hyperplasia) Urinary retention Continue with flomax.   Iron deficiency anemia Sp IV iron infusion.         Subjective: Patient with acute dyspnea this morning that improved with anxiolytic therapy.   Physical Exam: Vitals:   05/18/22 2235 05/19/22 0452 05/19/22 0937 05/19/22 1208  BP: 130/69 126/79 (!) 128/57   Pulse:  86 99   Resp:  20 18   Temp:  97.8 F (36.6 C)    TempSrc:  Oral Oral   SpO2:  99% 97% 93%  Weight:  81.2 kg    Height:       Neurology awake and alert Deconditioned  ENT with mild pallor Cardiovascular with S1 and S2 present and rhythmic with no gallops Respiratory with scattered rales but not wheezing or rhonchi Abdomen not distended No lower extremity edema  Data Reviewed:    Family Communication: I spoke with patient's children at the bedside, we talked in detail about patient's condition, plan of care and prognosis and all questions were addressed.   Disposition: Status is: Inpatient Remains inpatient appropriate because: pending transfer to residential hospice   Planned Discharge Destination:  hospice      Author: Tawni Millers, MD 05/19/2022 2:02 PM  For on call review [email or web address].

## 2022-05-19 NOTE — TOC Progression Note (Signed)
Transition of Care [facility]) - Progression Note    Patient Details  Name: John [name] MRN: [id number] Date of Birth: [birth year]  Transition of Care [facility]) CM/SW [place], LCSW Phone Number:[phone] 05/19/2022, 9:32 AM  Clinical Narrative:     CSW spoke with [id number] [phone] to check on bed availability and there are no beds available today and mos likely not this weekend. CSW will check back tomorrow.  TOC team will continue to assist with discharge planning needs.    Expected Discharge Plan: [place] Barriers to Discharge: Hospice Bed not available  Expected Discharge Plan and Services Expected Discharge Plan: [place]                                               Social Determinants of Health (SDOH) Interventions    Readmission Risk Interventions    03/26/2022    3:15 PM 03/08/2022   11:55 AM 01/29/2022   10:39 AM  Readmission Risk Prevention Plan  Transportation Screening Complete Complete Complete  Medication Review Press photographer) Complete Complete Complete  PCP or Specialist appointment within 3-5 days of discharge Complete    HRI or [place] Complete Complete Complete  SW Recovery Care/Counseling Consult Complete Complete Complete  Palliative Care Screening Complete Not Applicable Not [place] Not Applicable Not Applicable Not Applicable

## 2022-05-20 DIAGNOSIS — I480 Paroxysmal atrial fibrillation: Secondary | ICD-10-CM | POA: Diagnosis not present

## 2022-05-20 DIAGNOSIS — I5023 Acute on chronic systolic (congestive) heart failure: Secondary | ICD-10-CM | POA: Diagnosis not present

## 2022-05-20 DIAGNOSIS — N179 Acute kidney failure, unspecified: Secondary | ICD-10-CM | POA: Diagnosis not present

## 2022-05-20 DIAGNOSIS — I25810 Atherosclerosis of coronary artery bypass graft(s) without angina pectoris: Secondary | ICD-10-CM | POA: Diagnosis not present

## 2022-05-20 MED ORDER — FUROSEMIDE 10 MG/ML IJ SOLN
80.0000 mg | Freq: Two times a day (BID) | INTRAMUSCULAR | Status: DC
  Administered 2022-05-20 – 2022-05-21 (×3): 80 mg via INTRAVENOUS
  Filled 2022-05-20 (×3): qty 8

## 2022-05-20 NOTE — Progress Notes (Signed)
  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 -Recurrent acute on chronic systolic CHF and AKI on CKD 4 -Cards consulted, recommended palliative care eval,  -Plan for residential hospice  Assessment and Plan: * Acute on chronic systolic CHF (congestive heart failure) (HCC) Echocardiogram with reduced LV systolic function EF 25 to 30%, Recurrent hospitalizations due to heart exacerbations #6  Patient and his family have decided to transition to residential hospice.  Continue diuresis for palliative. Today patient with signs of hypervolemia, will change from oral torsemide to IV furosemide.   Acute on chronic hypoxemic respiratory failure, continue with palliative supplemental 02 per [place]   Acute kidney injury superimposed on chronic kidney disease (HCC) CKD stage 4.  Cardiorenal syndrome.  Not candidate for renal replacement therapy in the setting of advance heart failure.   No further blood work.   Paroxysmal atrial fibrillation (HCC) Continue with metoprolol for rate control.  No anticoagulation due to hematuria.   CAD (coronary artery disease) of artery bypass graft Continue with b blocker and statin No antiplatelet therapy due to hematuria.   COPD (chronic obstructive pulmonary disease) (HCC) No clinical signs of exacerbation.  Continue supplemental 02 per [place]   Essential hypertension Continue with metoprolol.   Type 2 diabetes mellitus with hyperlipidemia (HCC) Capillary glucose has been 199 to 361 On  comfort measures, insulin therapy has been discontinued.   BPH (benign prostatic hyperplasia) Urinary retention, patient has a foley cathter in place. Continue with flomax.   Iron deficiency anemia Sp IV iron infusion.         Subjective: Patient with dyspnea this am, no chest pain, has been somnolent after hydromorphone   Physical Exam: Vitals:   05/19/22 1951 05/20/22 0437 05/20/22 0707 05/20/22 0814  BP: (!) 147/72 129/77 (!) 142/66   Pulse: 96 85    Resp: 20 16    Temp: 98.9 F (37.2 C) 97.6 F (36.4 C)    TempSrc: Oral Oral    SpO2: 95% 100%  96%  Weight:  80 kg    Height:        Neurology somnolent but easy to arouse ENT with positive pallor Cardiovascular with S1 and S2 present with no gallops Respiratory with positive rales and scattered rhonchi Abdomen with no distention  No lower extremity edema  Data Reviewed:    Family Communication: no family at the bedside   Disposition: Status is: Inpatient Remains inpatient appropriate because: pending transfer to residential hospice   Planned Discharge Destination:  residential hospice      Author: Tawni Millers, MD 05/20/2022 10:41 AM  For on call review [email or web address].

## 2022-05-20 NOTE — TOC Progression Note (Signed)
Transition of Care [facility]) - Progression Note    Patient Details  Name: John [name] MRN: [id number] Date of Birth: 06-05-1941  Transition of Care [facility]) CM/SW [place], LCSW Phone Number:[phone] 05/20/2022, 11:34 AM  Clinical Narrative:     CSW spoke with Lana at [facility] and they do not have any beds today.  TOC team will continue to assist with discharge planning needs.   Expected Discharge Plan: [place] Barriers to Discharge: Hospice Bed not available  Expected Discharge Plan and Services Expected Discharge Plan: [place]                                               Social Determinants of Health (SDOH) Interventions    Readmission Risk Interventions    03/26/2022    3:15 PM 03/08/2022   11:55 AM 01/29/2022   10:39 AM  Readmission Risk Prevention Plan  Transportation Screening Complete Complete Complete  Medication Review Press photographer) Complete Complete Complete  PCP or Specialist appointment within 3-5 days of discharge Complete    HRI or [place] Complete Complete Complete  SW Recovery Care/Counseling Consult Complete Complete Complete  Palliative Care Screening Complete Not Applicable Not [place] Not Applicable Not Applicable Not Applicable

## 2022-05-20 NOTE — Progress Notes (Signed)
Daily Progress Note   Patient Name: John [name]       Date: 05/20/2022 DOB: 1941/05/13  Age: 81 y.o. MRN#: [id number] Attending Physician: Tawni Millers Primary Care Physician: [name], DO Admit Date: 05/13/2022  Reason for Consultation/Follow-up: Establishing goals of care and Terminal Care  Subjective: Medical records reviewed including progress notes. Patient assessed at the bedside. He reports feeling very hot and nasal cannula has accidentally been removed.  He is unsure how long this has been off for.  Endorses dyspnea this morning.  Patient is agreeable to receiving as needed dose of Dilaudid.  Replaced nasal cannula which he states is helpful as well.  Lowered temperature of his room for comfort.  Discussed with RN.  He has several family members visiting him yesterday afternoon.  I shared that I am off service until Thursday and if he has transferred to hospice of [facility] by that time, it was a pleasure taking care of him.  Questions and concerns addressed. PMT will continue to support holistically.   Length of Stay: 6   Physical Exam Vitals and nursing note reviewed.  Constitutional:      General: He is not in acute distress. Cardiovascular:     Rate and Rhythm: Normal rate.  Pulmonary:     Effort: Pulmonary effort is normal. Tachypnea present.  Skin:    General: Skin is warm and dry.  Neurological:     Mental Status: He is alert. Mental status is at baseline.  Psychiatric:        Mood and Affect: Mood normal.             Vital Signs: BP (!) 142/66   Pulse 85   Temp 97.6 F (36.4 C) (Oral)   Resp 16   Ht 5\' 7"  (1.702 m)   Wt 80 kg   SpO2 96%   BMI 27.62 kg/m  SpO2: SpO2: 96 % O2 Device: O2 Device: High Flow Nasal Cannula O2 Flow Rate:  O2 Flow Rate (L/min): (S) 6 L/min      Palliative Assessment/Data:     Palliative Care Assessment & Plan   Patient Profile: 81 y.o. male  with past medical history of  CAD, chronic systolic heart failure, ICM, paroxysmal atrial fibrillation, hypertension, hyperlipidemia, PVD, CKD stage IV, OSA, CVA, COPD, type 2 diabetes, BPH,  GERD, depression, and anxiety  admitted on 05/13/2022 with worsening shortness of breath, dyspnea on exertion.    Patient admitted for acute on chronic combined CHF.  He has had 5 admissions in the past 6 months.  PMT has been consulted to assist with goals of care conversation.    Assessment: End of life care  Recommendations/Plan: Continue comfort care  Discussed with RN who will administer a dose of Dilaudid, patient may benefit from scheduled Dilaudid if tachypnea continues to worsen Psychosocial and emotional support provided Patient remains stable for transfer to [facility] facility when a bed is available PMT will continue to follow and support   Prognosis:  < 2 weeks  Discharge Planning: [place] was discussed with Patient, RN   Total time: I spent 25 minutes in the care of the patient today in the above activities and documenting the encounter.          John [name] John Litter, PA-C  Palliative Medicine Team Team phone # [phone]  Thank you for allowing the Palliative Medicine Team to assist in the care of this patient. Please utilize secure chat with additional questions, if there is no response within 30 minutes please call the above phone number.  Palliative Medicine Team providers are available by phone from 7am to 7pm daily and can be reached through the team cell phone.  Should this patient require assistance outside of these hours, please call the patient's attending physician.

## 2022-05-21 DIAGNOSIS — N179 Acute kidney failure, unspecified: Secondary | ICD-10-CM | POA: Diagnosis not present

## 2022-05-21 DIAGNOSIS — I25810 Atherosclerosis of coronary artery bypass graft(s) without angina pectoris: Secondary | ICD-10-CM | POA: Diagnosis not present

## 2022-05-21 DIAGNOSIS — I5023 Acute on chronic systolic (congestive) heart failure: Secondary | ICD-10-CM | POA: Diagnosis not present

## 2022-05-21 DIAGNOSIS — I480 Paroxysmal atrial fibrillation: Secondary | ICD-10-CM | POA: Diagnosis not present

## 2022-05-21 MED ORDER — TORSEMIDE 20 MG PO TABS: 80.0000 mg | ORAL_TABLET | Freq: Two times a day (BID) | ORAL | Status: DC

## 2022-05-21 MED ORDER — POLYETHYLENE GLYCOL 3350 17 G PO PACK
17.0000 g | PACK | Freq: Every day | ORAL | Status: DC
  Administered 2022-05-21: 17 g via ORAL
  Filled 2022-05-21: qty 1

## 2022-05-21 MED ORDER — TORSEMIDE 40 MG PO TABS: 80.0000 mg | ORAL_TABLET | Freq: Two times a day (BID) | ORAL | 0 refills | Status: AC

## 2022-05-21 NOTE — Discharge Summary (Signed)
Physician Discharge Summary   Patient: John [name] MRN: [id number] DOB: [birth year]  Admit date:     05/13/2022  Discharge date: 05/21/22  Discharge Physician: Jimmy Picket Rahima Fleishman   PCP: [name], DO   Recommendations at discharge:    Patient with very poor prognosis, systolic heart failure. He and is family have decided to transition to comfort care, he is being transferred to residential hospice today.  He did received palliative diuresis during his hospitalization, continue with oral torsemide as tolerated.   Discharge Diagnoses: Principal Problem:   Acute on chronic systolic CHF (congestive heart failure) (HCC) Active Problems:   Acute kidney injury superimposed on chronic kidney disease (HCC)   Paroxysmal atrial fibrillation (HCC)   CAD (coronary artery disease) of artery bypass graft   Essential hypertension   COPD (chronic obstructive pulmonary disease) (HCC)   Type 2 diabetes mellitus with hyperlipidemia (HCC)   BPH (benign prostatic hyperplasia)   Iron deficiency anemia  Resolved Problems:   * No resolved hospital problems. [facility] Course: [name] is a chronically ill 81/M with history of COPD/chronic respiratory failure on 4 L home O2, asbestosis, chronic systolic CHF, EF 02%, paroxysmal A-fib, CAD with prior stents, type 2 diabetes mellitus, peripheral vascular disease, CKD 4, OSA on CPAP, chronic anemia, frequent hospitalizations with CHF presented to the ED with worsening dyspnea. -Recurrent acute on chronic systolic CHF and AKI on CKD 4 -Cards consulted, recommended palliative care eval,  -Plan for residential hospice  Assessment and Plan: * Acute on chronic systolic CHF (congestive heart failure) (HCC) Echocardiogram with reduced LV systolic function EF 25 to 30%, Recurrent hospitalizations due to heart exacerbations #6  Patient and his family have decided to transition to residential hospice.  Patient received palliative diuresis during his  hospitalization.  Continue with oral torsemide bid 80 mg as tolerated.  Acute on chronic hypoxemic respiratory failure, continue with palliative supplemental 02 per [place]   Acute kidney injury superimposed on chronic kidney disease (HCC) CKD stage 4.  Cardiorenal syndrome.  Not candidate for renal replacement therapy in the setting of advance heart failure.   No further blood work.   Paroxysmal atrial fibrillation [facility]) Patient not candidate for anticoagulation due to hematuria. Patient was placed on low dose carvedilol that will be discontinued at the time of his discharge.   CAD (coronary artery disease) of artery bypass graft Continue with b blocker and statin No antiplatelet therapy due to hematuria.   COPD (chronic obstructive pulmonary disease) (HCC) No clinical signs of exacerbation.  Continue supplemental 02 per [place]   Essential hypertension B blocker has been discontinued. Patient under palliative/ comfort care.   Type 2 diabetes mellitus with hyperlipidemia (HCC) Capillary glucose has been 199 to 361 On comfort measures, insulin therapy has been discontinued.   BPH (benign prostatic hyperplasia) Urinary retention, patient has a foley cathter in place. Continue with flomax.   Iron deficiency anemia Sp IV iron infusion.          Consultants: palliative care and cardiology  Procedures performed: none  Disposition:  residential hospice  Diet recommendation:  Discharge Diet Orders (From admission, onward)     Start     Ordered   05/21/22 0000  Diet - low sodium heart healthy        05/21/22 1152           Regular diet DISCHARGE MEDICATION: Allergies as of 05/21/2022       Reactions   Brilinta [ticagrelor] Shortness Of Breath  Penicillins Hives   Ezetimibe-simvastatin Other (See Comments)   Myalgia        Medication List     STOP taking these medications    acetaminophen 325 MG tablet Commonly known as: TYLENOL   albuterol 108 (90 Base)  MCG/ACT inhaler Commonly known as: VENTOLIN HFA   allopurinol 300 MG tablet Commonly known as: ZYLOPRIM   ALPRAZolam 0.25 MG tablet Commonly known as: XANAX   apixaban 2.5 MG Tabs tablet Commonly known as: ELIQUIS   BD Pen Needle Nano U/F 32G X 4 MM Misc Generic drug: Insulin Pen Needle   cholecalciferol 25 MCG (1000 UNIT) tablet Commonly known as: VITAMIN D3   clopidogrel 75 MG tablet Commonly known as: PLAVIX   ferrous sulfate 325 (65 FE) MG tablet   folic acid 1 MG tablet Commonly known as: FOLVITE   gabapentin 300 MG capsule Commonly known as: NEURONTIN   hydrALAZINE 25 MG tablet Commonly known as: APRESOLINE   ipratropium-albuterol 0.5-2.5 (3) MG/3ML Soln Commonly known as: DUONEB   isosorbide dinitrate 20 MG tablet Commonly known as: ISORDIL   Lantus SoloStar 100 UNIT/ML Solostar Pen Generic drug: insulin glargine   metolazone 2.5 MG tablet Commonly known as: ZAROXOLYN   metoprolol succinate 50 MG 24 hr tablet Commonly known as: TOPROL-XL   mometasone-formoterol 200-5 MCG/ACT Aero Commonly known as: DULERA   nitroGLYCERIN 0.4 MG SL tablet Commonly known as: NITROSTAT   pantoprazole 40 MG tablet Commonly known as: PROTONIX   potassium chloride SA 20 MEQ tablet Commonly known as: KLOR-CON M   rosuvastatin 10 MG tablet Commonly known as: CRESTOR   sertraline 25 MG tablet Commonly known as: ZOLOFT   tamsulosin 0.4 MG Caps capsule Commonly known as: FLOMAX   thiamine 100 MG tablet Commonly known as: VITAMIN B1   torsemide 20 MG tablet Commonly known as: DEMADEX   VITAMIN B 12 PO        Discharge Exam: Filed Weights   05/19/22 0452 05/20/22 0437 05/21/22 0500  Weight: 81.2 kg 80 kg 79.8 kg   BP (!) 144/78   Pulse 93   Temp 97.8 F (36.6 C) (Oral)   Resp 19   Ht 5\' 7"  (1.702 m)   Wt 79.8 kg   SpO2 95%   BMI 27.57 kg/m   Dyspnea is controlled, no chest pain or edema   Neurology patient somnolent but easy to arouse.  ENT  with mild pallor Cardiovascular with S1 and S2 present and rhythmic with no gallops Respiratory with no rales or wheezing Abdomen with no distention  No lower extremity edema   Condition at discharge: stable  The results of significant diagnostics from this hospitalization (including imaging, microbiology, ancillary and laboratory) are listed below for reference.   Imaging Studies: DG CHEST PORT 1 VIEW  Result Date: 05/15/2022 CLINICAL DATA:  Shortness of breath EXAM: PORTABLE CHEST 1 VIEW COMPARISON:  05/13/2022 FINDINGS: Cardiomegaly with moderate interstitial edema, progressive. Moderate right pleural effusion, increased. Associated patchy right upper and lower lobe opacities, likely atelectasis. No pneumothorax. Thoracic aortic atherosclerosis. IMPRESSION: Cardiomegaly with moderate interstitial edema and moderate right pleural effusion, progressive. Associated patchy right upper and lower lobe opacities, likely atelectasis. Electronically Signed   By: Julian Hy M.D.   On: 05/15/2022 03:30   DG Swallowing Func-Speech Pathology  Result Date: 05/14/2022 Table formatting from the original result was not included. Images from the original result were not included. Objective Swallowing Evaluation: Type of Study: MBS-Modified Barium Swallow Study  Patient Details Name:  John [name] MRN: [id number] Date of Birth: 1941/08/22 Today's Date: 05/14/2022 Time: SLP Start Time (ACUTE ONLY): 0900 -SLP Stop Time (ACUTE ONLY): 0942 SLP Time Calculation (min) (ACUTE ONLY): 7 min Past Medical History: Past Medical History: Diagnosis Date  Anemia   Anxiety   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)   BPH (benign prostatic hyperplasia)   CAD (coronary artery disease)   Cholesteatoma of attic   Chronic HFrEF (heart failure with reduced ejection fraction) (HCC)   Chronic kidney disease, stage 4 (severe) (HCC)   Depression   Diabetes mellitus (HCC)   Ear disease   GERD (gastroesophageal reflux disease)   Hyperlipidemia   Hypertension   PAF  (paroxysmal atrial fibrillation) (HCC)   PVC's (premature ventricular contractions)   PVD (peripheral vascular disease) ([place])   Severe sleep apnea   Stroke [facility])  Past Surgical History: Past Surgical History: Procedure Laterality Date  BONE ANCHORED HEARING AID IMPLANT Left 06/25/2013  Procedure: BONE ANCHORED HEARING AID (BAHA) IMPLANT;  Surgeon: Fannie Knee, MD;  Location: [place];  Service: ENT;  Laterality: Left;  CAROTID STENT INSERTION Left   COCHLEAR IMPLANT    CORONARY STENT PLACEMENT    MASS EXCISION Left 06/25/2013  Procedure: EXCISION LEFT TEMPORAL MASS;  Surgeon: Fannie Knee, MD;  Location: [place];  Service: ENT;  Laterality: Left;  RIGHT HEART CATH N/A 03/26/2022  Procedure: RIGHT HEART CATH;  Surgeon: Lorretta Harp, MD;  Location: [place] [facility];  Service: Cardiovascular;  Laterality: N/A;  TONSILLECTOMY   HPI: Omarri Eich is a 81 y.o. male with medical history significant of CAD, chronic systolic heart failure, ICM, paroxysmal atrial fibrillation, hypertension, hyperlipidemia, PVD, CKD stage IV, OSA, CVA, COPD, type 2 diabetes, BPH, GERD, depression, and anxiety. Presented with worsening shortness of breath dyspnea on exertion. CXR shows mild CHF  No data recorded  Recommendations for follow up therapy are one component of a multi-disciplinary discharge planning process, led by the attending physician.  Recommendations may be updated based on patient status, additional functional criteria and insurance authorization. Assessment / Plan / Recommendation   05/14/2022  10:03 AM Clinical Impressions Clinical Impression Pt exhibits a primary esophageal dysphagia without aspiration during exam. Several times he needed to perform a second effortful swallow to propel barium into UES suspected due to decreased pressures in esophagus. Timing of swallow was normal. He had piecemeal swallows needing two swallows intermittently to send food to posterior oral cavity. One  instance thin liquid reached the pyriform sinus before swallow onset which is within normal range. MBS does not diagnose below the level of the UES. Esohageal scan revealed stasis in esophagus with retrograde movement and delayed emptying with barium and barium pill. Thin liquids were needed to propel through the GE juncture. He had several throat clears which is seen from clinical standpoint. Recommend continue regular/thin liquids, pills with liquid and esophageal precautions (stay upright minimum 30 min after meals, drink fluids during meals, eat slowly). No further ST needed. SLP Visit Diagnosis Dysphagia, unspecified (R13.10) Impact on safety and function Mild aspiration risk     05/14/2022  10:03 AM Treatment Recommendations Treatment Recommendations No treatment recommended at this time      No data to display      05/14/2022  10:03 AM Diet Recommendations SLP Diet Recommendations Regular solids;Thin liquid Liquid Administration via Straw;Cup Medication Administration Whole meds with liquid Compensations Slow rate;Small sips/bites Postural Changes Remain semi-upright after after feeds/meals (Comment);Seated upright at 90 degrees  05/14/2022  10:03 AM Other Recommendations Oral Care Recommendations Oral care BID Follow Up Recommendations No SLP follow up Assistance recommended at discharge None    No data to display        05/14/2022  10:03 AM Oral Phase Oral Phase [facility]    05/14/2022  10:03 AM Pharyngeal Phase Pharyngeal Phase [facility]    05/14/2022  10:03 AM Cervical Esophageal Phase  Cervical Esophageal Phase Darryll Capers [place] [facility] 05/14/2022, 10:17 AM                     US RENAL  Result Date: 05/14/2022 CLINICAL DATA:  Acute kidney injury EXAM: RENAL / URINARY TRACT ULTRASOUND COMPLETE COMPARISON:  None Available. FINDINGS: Right Kidney: Renal measurements: 12.8 x 6.2 x 6.1 cm = volume: 251 mL. Mildly increased. No mass or hydronephrosis visualized. Lower pole renal cyst measures 7.1 x 6.8 x 6.0 cm. Left  Kidney: Renal measurements: 10.5 x 4.4 x 5.4 cm = volume: 132 mL. Lower pole cyst measures 4.6 x 5.1 x 4.2 cm. Mildly increased echogenicity. No hydronephrosis. Bladder: Appears normal for degree of bladder distention. Other: None. IMPRESSION: 1. No hydronephrosis. Mildly increased echogenicity compatible with medical renal disease. Electronically Signed   By: Ulyses Jarred M.D.   On: 05/14/2022 03:04   DG Chest 2 View  Result Date: 05/13/2022 CLINICAL DATA:  Shortness of breath EXAM: CHEST - 2 VIEW COMPARISON:  03/29/2022 FINDINGS: Diffuse interstitial opacity. Mild cardiomegaly. Small right pleural effusion. IMPRESSION: Mild congestive heart failure Electronically Signed   By: Ulyses Jarred M.D.   On: 05/13/2022 21:31    Microbiology: Results for orders placed or performed during the hospital encounter of 03/23/22  Resp Panel by RT-PCR (Flu A&B, Covid) Anterior Nasal Swab     Status: None   Collection Time: 03/23/22  7:56 AM   Specimen: Anterior Nasal Swab  Result Value Ref Range Status   SARS Coronavirus 2 by RT PCR NEGATIVE NEGATIVE Final    Comment: (NOTE) SARS-CoV-2 target nucleic acids are NOT DETECTED.  The SARS-CoV-2 RNA is generally detectable in upper respiratory specimens during the acute phase of infection. The lowest concentration of SARS-CoV-2 viral copies this assay can detect is 138 copies/mL. A negative result does not preclude SARS-Cov-2 infection and should not be used as the sole basis for treatment or other patient management decisions. A negative result may occur with  improper specimen collection/handling, submission of specimen other than nasopharyngeal swab, presence of viral mutation(s) within the areas targeted by this assay, and inadequate number of viral copies(<138 copies/mL). A negative result must be combined with clinical observations, patient history, and epidemiological information. The expected result is Negative.  Fact Sheet for Patients:   EntrepreneurPulse.com.au  Fact Sheet for Healthcare Providers:  IncredibleEmployment.be  This test is no t yet approved or cleared by the [place] FDA and  has been authorized for detection and/or diagnosis of SARS-CoV-2 by FDA under an Emergency Use Authorization (EUA). This EUA will remain  in effect (meaning this test can be used) for the duration of the COVID-19 declaration under Section 564(b)(1) of the Act, 21 U.S.C.section 360bbb-3(b)(1), unless the authorization is terminated  or revoked sooner.       Influenza A by PCR NEGATIVE NEGATIVE Final   Influenza B by PCR NEGATIVE NEGATIVE Final    Comment: (NOTE) The Xpert Xpress SARS-CoV-2/FLU/RSV plus assay is intended as an aid in the diagnosis of influenza from Nasopharyngeal swab specimens and should not be used  as a sole basis for treatment. Nasal washings and aspirates are unacceptable for Xpert Xpress SARS-CoV-2/FLU/RSV testing.  Fact Sheet for Patients: EntrepreneurPulse.com.au  Fact Sheet for Healthcare Providers: IncredibleEmployment.be  This test is not yet approved or cleared by the [place] FDA and has been authorized for detection and/or diagnosis of SARS-CoV-2 by FDA under an Emergency Use Authorization (EUA). This EUA will remain in effect (meaning this test can be used) for the duration of the COVID-19 declaration under Section 564(b)(1) of the Act, 21 U.S.C. section 360bbb-3(b)(1), unless the authorization is terminated or revoked.  Performed at [facility], [place] [address] [place], [place] [address]     Labs: CBC: Recent Labs  Lab 05/15/22 0133 05/16/22 0134  WBC 13.5* 11.0*  HGB 9.0* 9.3*  HCT 28.6* 28.7*  MCV 90.8 90.8  PLT 246 027   Basic Metabolic Panel: Recent Labs  Lab 05/14/22 1857 05/15/22 0133 05/16/22 0134  NA 133* 134* 138  K 3.7 4.4 3.7  CL 91* 92* 94*  CO2 31 28 33*  GLUCOSE 290*  211* 157*  BUN 89* 86* 82*  CREATININE 3.20* 2.98* 2.79*  CALCIUM 9.1 9.4 9.5   Liver Function Tests: No results for input(s): "AST", "ALT", "ALKPHOS", "BILITOT", "PROT", "ALBUMIN" in the last 168 hours. CBG: Recent Labs  Lab 05/16/22 2020 05/17/22 0009 05/17/22 0534 05/17/22 0733 05/17/22 1817  GLUCAP 238* 199* 181* 167* 361*    Discharge time spent: greater than 30 minutes.  Signed: Tawni Millers, MD [facility] 05/21/2022

## 2022-05-21 NOTE — Progress Notes (Addendum)
Patient OZ:DGUYQI Menken      DOB: Feb 14, 1941      HKV:[id number]      Palliative Medicine Team    Subjective: Bedside symptom check completed. No family or visitors bedside at time of visit.   Physical exam: Patient resting in bed with eyes closed at time of visit. Breathing even, slightly labored with nasal cannula applied, no excessive secretions noted. Patient easily awakens, maintains eye contact, pleasantly conversational. Patient declines chaplain visit. Patient appears flushed in his face, extremities warm to touch. Patient denies pain at this time but does endorse some symptoms of shortness of breath and anxiety.    Assessment and plan: This RN touched base with bedside RN, in agreement to continue PRN administrations for work of breathing and anxiety. Plan shared with patient, in agreement. All questions answered. Bedside RN without any additional needs or concerns at this time. Patient remains stable for transfer to residential hospice in [place]. Will continue to follow for any changes or advances.   Addendum: Returned to patient's bedside at 13:40 to reassess work of breathing after this morning's visit. Patient comfortably sleeping, breathing even and non-labored with nasal cannula applied. Bedside RN endorses he has been comfortably resting since first administration this am (see eMAR). Patient has offered bed at [facility] today, transport in route, remains stable.    Thank you for allowing the Palliative Medicine Team to assist in the care of this patient.     Damian Leavell, MSN, RN Palliative Medicine Team Team Phone: [phone]  This phone is monitored 7a-7p, please reach out to attending physician outside of these hours for urgent needs.

## 2022-05-21 NOTE — Progress Notes (Signed)
Report called to [facility] with [facility].

## 2022-05-21 NOTE — TOC Transition Note (Signed)
Transition of Care [facility]) - CM/SW Discharge Note   Patient Details  Name: John [name] MRN: [id number] Date of Birth: 1941/05/14  Transition [facility]) CM/SW Contact:  Bethann Berkshire, [place] Phone Number: 05/21/2022, 12:48 PM   Clinical Narrative:     CSW received call from [facility] who informed CSW that consents had been signed with pt's son and she scheduled non emergency ambulance transport. Marchia Meiers stated that EMS pick up time is estimated for around 1pm. CSW notified RN  Final next level of care: [place] Barriers to Discharge: No Barriers Identified   Patient Goals and CMS Choice        Discharge Placement              Patient chooses bed at:  ([facility]) Patient to be transferred to facility by: [facility]   Patient and family notified of of transfer: 05/21/22  Discharge Plan and Services                                     Social Determinants of Health (SDOH) Interventions     Readmission Risk Interventions    03/26/2022    3:15 PM 03/08/2022   11:55 AM 01/29/2022   10:39 AM  Readmission Risk Prevention Plan  Transportation Screening Complete Complete Complete  Medication Review Press photographer) Complete Complete Complete  PCP or Specialist appointment within 3-5 days of discharge Complete    HRI or [place] Complete Complete Complete  SW Recovery Care/Counseling Consult Complete Complete Complete  Palliative Care Screening Complete Not Applicable Not [place] Not Applicable Not Applicable Not Applicable

## 2022-05-21 NOTE — TOC Progression Note (Addendum)
Transition of Care [facility]) - Progression Note    Patient Details  Name: John [name] MRN: [id number] Date of Birth: 04/28/1941  Transition of Care [facility]) CM/SW [place],  Phone Number: 05/21/2022, 9:49 AM  Clinical Narrative:     Left message with Marchia Meiers at [place] requesting update on bed availability for pt.  CSW received return call/ Marchia Meiers confirmed they have a bed today for pt. They can admit pt once pt's son completes paperwork. Marchia Meiers will arrange EMS transport. She will update CSW when paperwork complete and EMS Scheduled.  Expected Discharge Plan: [place] Barriers to Discharge: Hospice Bed not available  Expected Discharge Plan and Services Expected Discharge Plan: [place]                                               Social Determinants of Health (SDOH) Interventions    Readmission Risk Interventions    03/26/2022    3:15 PM 03/08/2022   11:55 AM 01/29/2022   10:39 AM  Readmission Risk Prevention Plan  Transportation Screening Complete Complete Complete  Medication Review Press photographer) Complete Complete Complete  PCP or Specialist appointment within 3-5 days of discharge Complete    HRI or [place] Complete Complete Complete  SW Recovery Care/Counseling Consult Complete Complete Complete  Palliative Care Screening Complete Not Applicable Not [place] Not Applicable Not Applicable Not Applicable

## 2022-05-22 ENCOUNTER — Other Ambulatory Visit: Payer: Self-pay | Admitting: *Deleted

## 2022-05-23 ENCOUNTER — Encounter: Payer: Self-pay | Admitting: *Deleted

## 2022-05-23 ENCOUNTER — Ambulatory Visit: Payer: Self-pay | Admitting: *Deleted

## 2022-05-23 ENCOUNTER — Ambulatory Visit: Payer: Medicare Other | Admitting: Pulmonary Disease

## 2022-05-23 NOTE — Patient Outreach (Signed)
[facility]) Care Management  05/23/2022  Deveion Denz June 15, 1941 [id number]  Unsuccessful call to Mr. Sherrow who now is residing at [facility] [place]). Left a message and advised NP will call again.  Talked with daughter, Talbert Nan, who advised that her father is declining quickly. He is being well taken care of at the [facility] [place]. They have family in from [place] to visit. Encouraged Kathey to let me know if there is anything NP can do.  Case closed.  Eulah Pont. Myrtie Neither, MSN, [facility] Gerontological Nurse Practitioner [facility] Care Management [phone]

## 2022-05-31 ENCOUNTER — Other Ambulatory Visit: Payer: Self-pay | Admitting: *Deleted

## 2022-06-21 ENCOUNTER — Ambulatory Visit: Payer: Medicare Other | Admitting: Pulmonary Disease

## 2022-06-22 DEATH — deceased

## 2023-04-30 LAB — HM DIABETES EYE EXAM

## 2023-07-27 IMAGING — DX DG CHEST 1V PORT
1 series · 1 of 1 positions shown · non-contrast
Comparison: Chest x-ray 12/24/2021. CT chest 12/24/2021

CLINICAL DATA: sob

EXAM:
PORTABLE CHEST 1 VIEW

[chest ap]
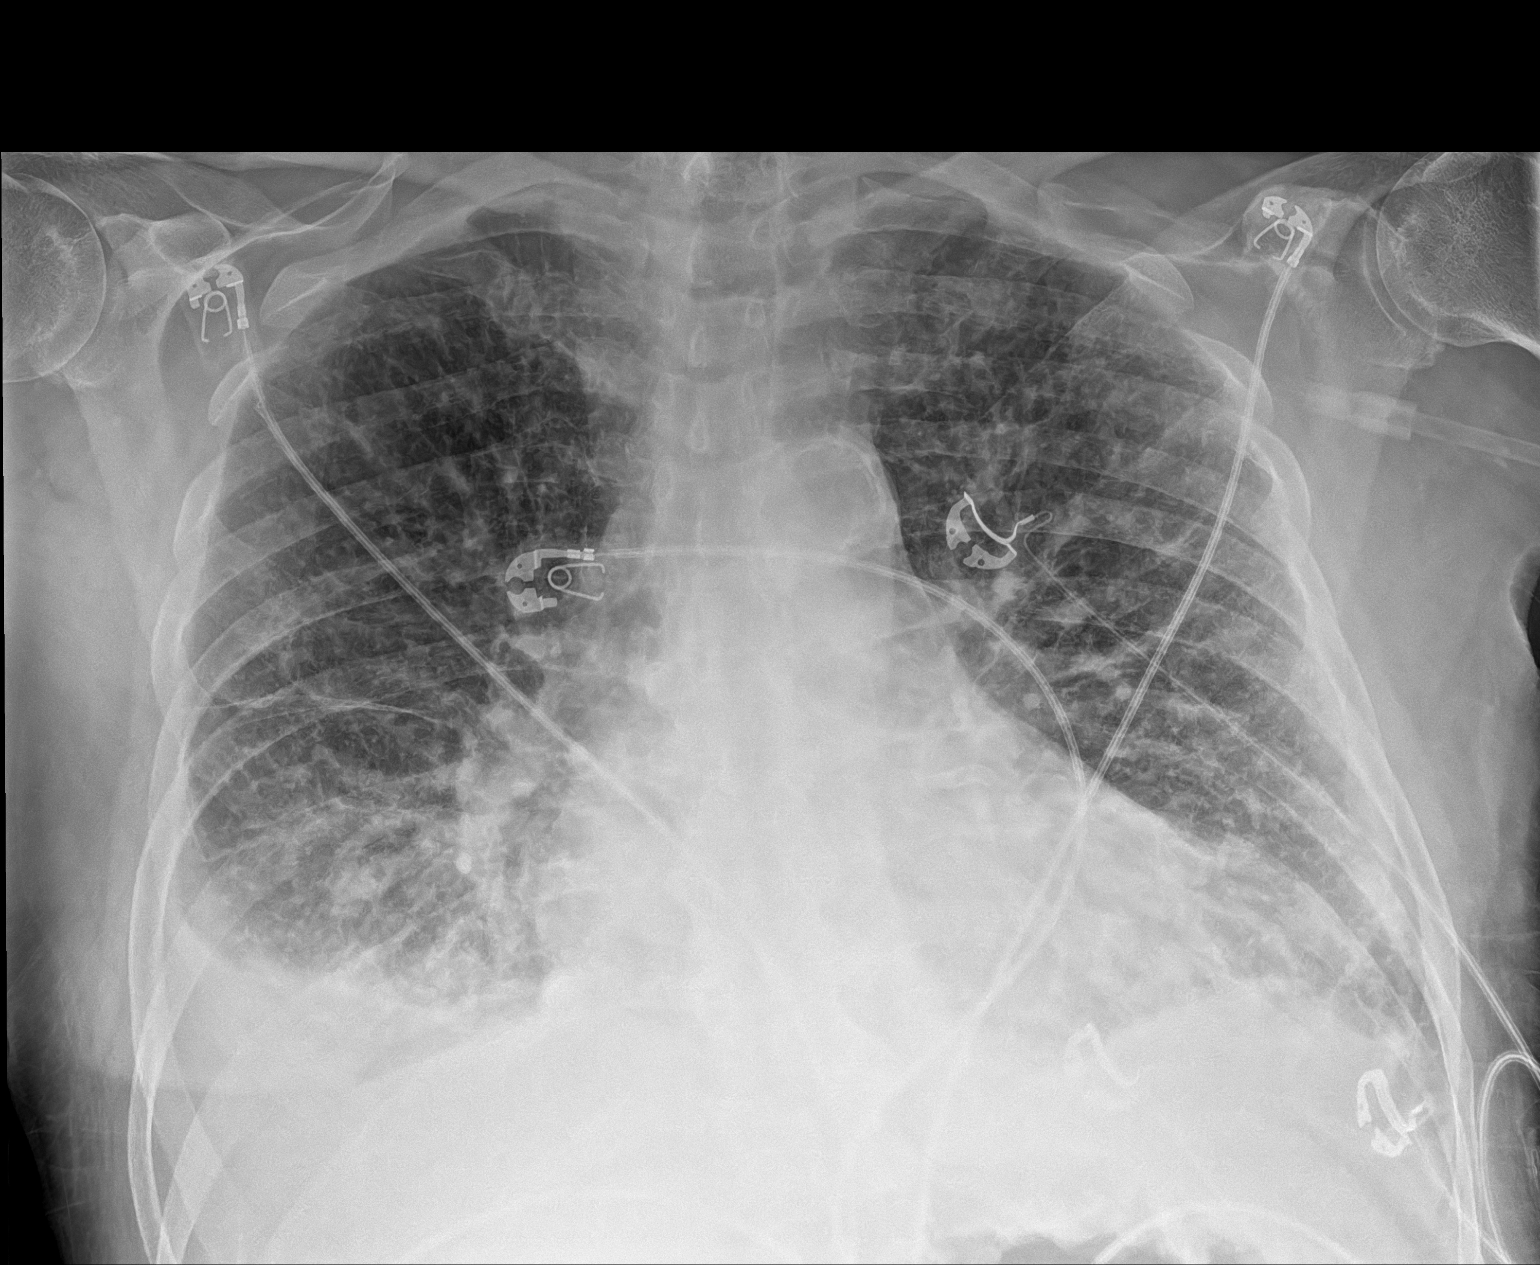

[1 of 1 positions shown; findings below may reference images not displayed]

FINDINGS: The heart and mediastinal contours are unchanged. Aortic
calcification. Coronary artery stent.

No focal consolidation. Chronic coarsened intra markings with
superimposed a patchy interstitial and airspace opacities. Pulmonary
edema. Trace left and trace to small volume right pleural effusions.
No pneumothorax.

No acute osseous abnormality.
IMPRESSION: 1. Chronic coarsened intra markings with superimposed a patchy
interstitial and airspace opacities. Finding could represent
infection in relation versus pulmonary edema. Followup PA and
lateral chest X-ray is recommended in 3-4 weeks following therapy to
ensure resolution.
2. Trace left and trace to small volume right pleural effusions.
3.  Aortic Atherosclerosis ([id number]).

## 2024-06-09 ENCOUNTER — Other Ambulatory Visit (HOSPITAL_COMMUNITY): Payer: Self-pay
